# Patient Record
Sex: Female | Born: 1945
Health system: Southern US, Community
[De-identification: ages and names within clinical notes are randomized; demographics above are authoritative.]

## PROBLEM LIST (undated history)

## (undated) DIAGNOSIS — G4733 Obstructive sleep apnea (adult) (pediatric): Secondary | ICD-10-CM

## (undated) DIAGNOSIS — M1990 Unspecified osteoarthritis, unspecified site: Secondary | ICD-10-CM

## (undated) DIAGNOSIS — R42 Dizziness and giddiness: Secondary | ICD-10-CM

## (undated) DIAGNOSIS — Z860100 Personal history of colon polyps, unspecified: Secondary | ICD-10-CM

## (undated) DIAGNOSIS — M7062 Trochanteric bursitis, left hip: Secondary | ICD-10-CM

## (undated) DIAGNOSIS — R682 Dry mouth, unspecified: Secondary | ICD-10-CM

## (undated) DIAGNOSIS — M545 Low back pain, unspecified: Secondary | ICD-10-CM

## (undated) DIAGNOSIS — E7800 Pure hypercholesterolemia, unspecified: Secondary | ICD-10-CM

## (undated) DIAGNOSIS — K2950 Unspecified chronic gastritis without bleeding: Secondary | ICD-10-CM

## (undated) DIAGNOSIS — R5381 Other malaise: Secondary | ICD-10-CM

## (undated) DIAGNOSIS — F419 Anxiety disorder, unspecified: Secondary | ICD-10-CM

## (undated) DIAGNOSIS — K219 Gastro-esophageal reflux disease without esophagitis: Secondary | ICD-10-CM

## (undated) DIAGNOSIS — R1013 Epigastric pain: Secondary | ICD-10-CM

## (undated) DIAGNOSIS — G709 Myoneural disorder, unspecified: Secondary | ICD-10-CM

## (undated) DIAGNOSIS — R002 Palpitations: Secondary | ICD-10-CM

## (undated) DIAGNOSIS — G894 Chronic pain syndrome: Secondary | ICD-10-CM

## (undated) DIAGNOSIS — M8580 Other specified disorders of bone density and structure, unspecified site: Secondary | ICD-10-CM

## (undated) DIAGNOSIS — D509 Iron deficiency anemia, unspecified: Principal | ICD-10-CM

## (undated) DIAGNOSIS — N3010 Interstitial cystitis (chronic) without hematuria: Secondary | ICD-10-CM

## (undated) DIAGNOSIS — L409 Psoriasis, unspecified: Secondary | ICD-10-CM

## (undated) DIAGNOSIS — F339 Major depressive disorder, recurrent, unspecified: Secondary | ICD-10-CM

## (undated) DIAGNOSIS — I10 Essential (primary) hypertension: Secondary | ICD-10-CM

## (undated) DIAGNOSIS — B3784 Candidal otitis externa: Secondary | ICD-10-CM

## (undated) DIAGNOSIS — M797 Fibromyalgia: Secondary | ICD-10-CM

## (undated) DIAGNOSIS — K5730 Diverticulosis of large intestine without perforation or abscess without bleeding: Secondary | ICD-10-CM

## (undated) DIAGNOSIS — K117 Disturbances of salivary secretion: Secondary | ICD-10-CM

## (undated) DIAGNOSIS — D472 Monoclonal gammopathy: Secondary | ICD-10-CM

## (undated) DIAGNOSIS — R5383 Other fatigue: Secondary | ICD-10-CM

## (undated) DIAGNOSIS — E039 Hypothyroidism, unspecified: Secondary | ICD-10-CM

## (undated) DIAGNOSIS — K589 Irritable bowel syndrome without diarrhea: Secondary | ICD-10-CM

## (undated) DIAGNOSIS — R413 Other amnesia: Secondary | ICD-10-CM

## (undated) DIAGNOSIS — N182 Chronic kidney disease, stage 2 (mild): Secondary | ICD-10-CM

## (undated) DIAGNOSIS — M7061 Trochanteric bursitis, right hip: Secondary | ICD-10-CM

## (undated) HISTORY — DX: Anxiety disorder, unspecified: F41.9

## (undated) HISTORY — DX: Hypothyroidism, unspecified: E03.9

## (undated) HISTORY — PX: ABDOMINAL HYSTERECTOMY: SHX81

## (undated) HISTORY — DX: Other fatigue: R53.83

## (undated) HISTORY — DX: Interstitial cystitis (chronic) without hematuria: N30.10

## (undated) HISTORY — DX: Disturbances of salivary secretion: K11.7

## (undated) HISTORY — DX: Monoclonal gammopathy: D47.2

## (undated) HISTORY — PX: COLONOSCOPY: SHX174

## (undated) HISTORY — DX: Unspecified chronic gastritis without bleeding: K29.50

## (undated) HISTORY — DX: Other specified disorders of bone density and structure, unspecified site: M85.80

## (undated) HISTORY — DX: Chronic kidney disease, stage 2 (mild): N18.2

## (undated) HISTORY — PX: HERNIA REPAIR: SHX51

## (undated) HISTORY — DX: Chronic pain syndrome: G89.4

## (undated) HISTORY — DX: Obstructive sleep apnea (adult) (pediatric): G47.33

## (undated) HISTORY — DX: Low back pain, unspecified: M54.50

## (undated) HISTORY — PX: NASAL SINUS SURGERY: SHX719

## (undated) HISTORY — DX: Low back pain: M54.5

## (undated) HISTORY — DX: Personal history of colonic polyps: Z86.010

## (undated) HISTORY — DX: Myoneural disorder, unspecified: G70.9

## (undated) HISTORY — DX: Epigastric pain: R10.13

## (undated) HISTORY — DX: Dry mouth, unspecified: R68.2

## (undated) HISTORY — DX: Trochanteric bursitis, right hip: M70.61

## (undated) HISTORY — DX: Major depressive disorder, recurrent, unspecified: F33.9

## (undated) HISTORY — DX: Trochanteric bursitis, right hip: M70.62

## (undated) HISTORY — DX: Personal history of colon polyps, unspecified: Z86.0100

## (undated) HISTORY — DX: Iron deficiency anemia, unspecified: D50.9

## (undated) HISTORY — DX: Gastro-esophageal reflux disease without esophagitis: K21.9

## (undated) HISTORY — DX: Diverticulosis of large intestine without perforation or abscess without bleeding: K57.30

## (undated) HISTORY — DX: Psoriasis, unspecified: L40.9

## (undated) HISTORY — PX: EYE SURGERY: SHX253

## (undated) HISTORY — PX: CHOLECYSTECTOMY: SHX55

## (undated) HISTORY — PX: ESOPHAGOGASTRODUODENOSCOPY: SHX1529

## (undated) HISTORY — DX: Other malaise: R53.81

## (undated) HISTORY — DX: Other amnesia: R41.3

## (undated) HISTORY — DX: Palpitations: R00.2

## (undated) HISTORY — PX: KNEE ARTHROSCOPY: SHX127

## (undated) HISTORY — PX: HAND SURGERY: SHX662

## (undated) HISTORY — PX: TOTAL KNEE ARTHROPLASTY: SHX125

## (undated) HISTORY — DX: Candidal otitis externa: B37.84

## (undated) HISTORY — DX: Unspecified osteoarthritis, unspecified site: M19.90

## (undated) HISTORY — DX: Dizziness and giddiness: R42

## (undated) HISTORY — DX: Fibromyalgia: M79.7

## (undated) HISTORY — PX: CARDIAC CATHETERIZATION: SHX172

## (undated) HISTORY — DX: Essential (primary) hypertension: I10

## (undated) HISTORY — DX: Pure hypercholesterolemia, unspecified: E78.00

---

## 1998-07-10 ENCOUNTER — Ambulatory Visit (HOSPITAL_COMMUNITY): Admission: RE | Admit: 1998-07-10 | Discharge: 1998-07-10 | Payer: Self-pay | Admitting: Gynecology

## 1998-07-17 ENCOUNTER — Ambulatory Visit (HOSPITAL_COMMUNITY): Admission: RE | Admit: 1998-07-17 | Discharge: 1998-07-17 | Payer: Self-pay | Admitting: Gynecology

## 1998-11-11 ENCOUNTER — Encounter: Payer: Self-pay | Admitting: Gynecology

## 1998-11-11 ENCOUNTER — Ambulatory Visit (HOSPITAL_COMMUNITY): Admission: RE | Admit: 1998-11-11 | Discharge: 1998-11-11 | Payer: Self-pay | Admitting: Gynecology

## 1999-04-29 ENCOUNTER — Emergency Department (HOSPITAL_COMMUNITY): Admission: EM | Admit: 1999-04-29 | Discharge: 1999-04-29 | Payer: Self-pay | Admitting: Emergency Medicine

## 1999-04-29 ENCOUNTER — Encounter: Payer: Self-pay | Admitting: Emergency Medicine

## 1999-05-03 ENCOUNTER — Inpatient Hospital Stay (HOSPITAL_COMMUNITY): Admission: EM | Admit: 1999-05-03 | Discharge: 1999-05-06 | Payer: Self-pay | Admitting: *Deleted

## 1999-05-04 ENCOUNTER — Encounter: Payer: Self-pay | Admitting: Internal Medicine

## 1999-05-15 ENCOUNTER — Encounter: Payer: Self-pay | Admitting: Gynecology

## 1999-05-15 ENCOUNTER — Ambulatory Visit (HOSPITAL_COMMUNITY): Admission: RE | Admit: 1999-05-15 | Discharge: 1999-05-15 | Payer: Self-pay | Admitting: Gynecology

## 1999-05-20 ENCOUNTER — Other Ambulatory Visit: Admission: RE | Admit: 1999-05-20 | Discharge: 1999-05-20 | Payer: Self-pay | Admitting: Gynecology

## 1999-08-26 ENCOUNTER — Encounter (INDEPENDENT_AMBULATORY_CARE_PROVIDER_SITE_OTHER): Payer: Self-pay | Admitting: Specialist

## 1999-08-26 ENCOUNTER — Ambulatory Visit (HOSPITAL_COMMUNITY): Admission: RE | Admit: 1999-08-26 | Discharge: 1999-08-26 | Payer: Self-pay | Admitting: Gastroenterology

## 2000-04-25 ENCOUNTER — Encounter: Payer: Self-pay | Admitting: Otolaryngology

## 2000-04-25 ENCOUNTER — Encounter: Admission: RE | Admit: 2000-04-25 | Discharge: 2000-04-25 | Payer: Self-pay | Admitting: Otolaryngology

## 2000-06-08 ENCOUNTER — Encounter: Payer: Self-pay | Admitting: Gynecology

## 2000-06-08 ENCOUNTER — Ambulatory Visit (HOSPITAL_COMMUNITY): Admission: RE | Admit: 2000-06-08 | Discharge: 2000-06-08 | Payer: Self-pay | Admitting: Gynecology

## 2000-12-01 ENCOUNTER — Ambulatory Visit (HOSPITAL_COMMUNITY): Admission: RE | Admit: 2000-12-01 | Discharge: 2000-12-01 | Payer: Self-pay | Admitting: Orthopedic Surgery

## 2001-04-02 ENCOUNTER — Encounter: Payer: Self-pay | Admitting: Pulmonary Disease

## 2001-04-02 ENCOUNTER — Ambulatory Visit (HOSPITAL_COMMUNITY): Admission: RE | Admit: 2001-04-02 | Discharge: 2001-04-02 | Payer: Self-pay | Admitting: Pulmonary Disease

## 2001-07-27 ENCOUNTER — Ambulatory Visit (HOSPITAL_COMMUNITY): Admission: RE | Admit: 2001-07-27 | Discharge: 2001-07-27 | Payer: Self-pay | Admitting: Gynecology

## 2001-07-27 ENCOUNTER — Encounter: Payer: Self-pay | Admitting: Gynecology

## 2001-08-21 ENCOUNTER — Encounter: Payer: Self-pay | Admitting: Neurosurgery

## 2001-08-23 ENCOUNTER — Encounter: Payer: Self-pay | Admitting: Neurosurgery

## 2001-08-23 ENCOUNTER — Ambulatory Visit (HOSPITAL_COMMUNITY): Admission: RE | Admit: 2001-08-23 | Discharge: 2001-08-24 | Payer: Self-pay | Admitting: Neurosurgery

## 2002-04-06 ENCOUNTER — Ambulatory Visit (HOSPITAL_COMMUNITY): Admission: RE | Admit: 2002-04-06 | Discharge: 2002-04-06 | Payer: Self-pay | Admitting: Cardiology

## 2002-04-10 ENCOUNTER — Encounter: Payer: Self-pay | Admitting: *Deleted

## 2002-04-10 ENCOUNTER — Ambulatory Visit (HOSPITAL_COMMUNITY): Admission: RE | Admit: 2002-04-10 | Discharge: 2002-04-10 | Payer: Self-pay | Admitting: *Deleted

## 2002-09-06 ENCOUNTER — Ambulatory Visit (HOSPITAL_COMMUNITY): Admission: RE | Admit: 2002-09-06 | Discharge: 2002-09-06 | Payer: Self-pay | Admitting: Gynecology

## 2002-09-06 ENCOUNTER — Encounter: Payer: Self-pay | Admitting: Gynecology

## 2002-11-14 ENCOUNTER — Other Ambulatory Visit: Admission: RE | Admit: 2002-11-14 | Discharge: 2002-11-14 | Payer: Self-pay | Admitting: Gynecology

## 2003-04-20 ENCOUNTER — Emergency Department (HOSPITAL_COMMUNITY): Admission: EM | Admit: 2003-04-20 | Discharge: 2003-04-20 | Payer: Self-pay | Admitting: Emergency Medicine

## 2003-04-20 ENCOUNTER — Encounter: Payer: Self-pay | Admitting: Emergency Medicine

## 2003-10-15 ENCOUNTER — Ambulatory Visit (HOSPITAL_COMMUNITY): Admission: RE | Admit: 2003-10-15 | Discharge: 2003-10-15 | Payer: Self-pay | Admitting: Gynecology

## 2003-11-18 ENCOUNTER — Other Ambulatory Visit: Admission: RE | Admit: 2003-11-18 | Discharge: 2003-11-18 | Payer: Self-pay | Admitting: Gynecology

## 2004-04-24 ENCOUNTER — Encounter: Payer: Self-pay | Admitting: Gastroenterology

## 2004-07-21 ENCOUNTER — Ambulatory Visit: Payer: Self-pay | Admitting: Pulmonary Disease

## 2004-10-19 ENCOUNTER — Ambulatory Visit: Payer: Self-pay | Admitting: Pulmonary Disease

## 2004-10-26 ENCOUNTER — Ambulatory Visit (HOSPITAL_COMMUNITY): Admission: RE | Admit: 2004-10-26 | Discharge: 2004-10-26 | Payer: Self-pay | Admitting: Gynecology

## 2004-10-28 ENCOUNTER — Ambulatory Visit: Payer: Self-pay | Admitting: Hematology & Oncology

## 2004-11-03 ENCOUNTER — Ambulatory Visit: Payer: Self-pay | Admitting: Cardiology

## 2004-11-06 ENCOUNTER — Ambulatory Visit: Payer: Self-pay

## 2004-11-19 ENCOUNTER — Ambulatory Visit: Payer: Self-pay | Admitting: Pulmonary Disease

## 2004-11-24 ENCOUNTER — Other Ambulatory Visit: Admission: RE | Admit: 2004-11-24 | Discharge: 2004-11-24 | Payer: Self-pay | Admitting: Gynecology

## 2005-05-12 ENCOUNTER — Ambulatory Visit (HOSPITAL_COMMUNITY): Admission: RE | Admit: 2005-05-12 | Discharge: 2005-05-12 | Payer: Self-pay | Admitting: Dermatology

## 2005-07-11 ENCOUNTER — Emergency Department (HOSPITAL_COMMUNITY): Admission: EM | Admit: 2005-07-11 | Discharge: 2005-07-12 | Payer: Self-pay | Admitting: Emergency Medicine

## 2005-07-28 ENCOUNTER — Ambulatory Visit: Payer: Self-pay | Admitting: Pulmonary Disease

## 2005-10-29 ENCOUNTER — Ambulatory Visit: Payer: Self-pay | Admitting: Pulmonary Disease

## 2005-11-03 ENCOUNTER — Ambulatory Visit: Payer: Self-pay | Admitting: Pulmonary Disease

## 2005-12-07 ENCOUNTER — Ambulatory Visit (HOSPITAL_COMMUNITY): Admission: RE | Admit: 2005-12-07 | Discharge: 2005-12-07 | Payer: Self-pay | Admitting: Gynecology

## 2006-01-13 ENCOUNTER — Ambulatory Visit (HOSPITAL_BASED_OUTPATIENT_CLINIC_OR_DEPARTMENT_OTHER): Admission: RE | Admit: 2006-01-13 | Discharge: 2006-01-13 | Payer: Self-pay | Admitting: Specialist

## 2006-03-16 ENCOUNTER — Other Ambulatory Visit: Admission: RE | Admit: 2006-03-16 | Discharge: 2006-03-16 | Payer: Self-pay | Admitting: Gynecology

## 2006-03-29 ENCOUNTER — Ambulatory Visit: Payer: Self-pay | Admitting: Pulmonary Disease

## 2006-04-14 ENCOUNTER — Ambulatory Visit: Payer: Self-pay | Admitting: Gastroenterology

## 2006-04-20 HISTORY — PX: COLONOSCOPY: SHX174

## 2006-05-02 ENCOUNTER — Ambulatory Visit: Payer: Self-pay | Admitting: Gastroenterology

## 2006-07-28 ENCOUNTER — Ambulatory Visit: Payer: Self-pay | Admitting: Pulmonary Disease

## 2006-08-18 ENCOUNTER — Ambulatory Visit: Payer: Self-pay | Admitting: Internal Medicine

## 2006-09-14 ENCOUNTER — Inpatient Hospital Stay (HOSPITAL_COMMUNITY): Admission: RE | Admit: 2006-09-14 | Discharge: 2006-09-18 | Payer: Self-pay | Admitting: Specialist

## 2006-12-28 ENCOUNTER — Ambulatory Visit: Payer: Self-pay | Admitting: Pulmonary Disease

## 2006-12-28 LAB — CONVERTED CEMR LAB
ALT: 14 units/L (ref 0–40)
AST: 13 units/L (ref 0–37)
Bilirubin, Direct: 0.1 mg/dL (ref 0.0–0.3)
Cholesterol: 288 mg/dL (ref 0–200)
Direct LDL: 222.2 mg/dL
HDL: 32.3 mg/dL — ABNORMAL LOW (ref >39.0)
Total CHOL/HDL Ratio: 8.9
Total Protein: 6.9 g/dL (ref 6.0–8.3)

## 2007-01-02 ENCOUNTER — Ambulatory Visit: Payer: Self-pay | Admitting: Cardiology

## 2007-01-11 ENCOUNTER — Ambulatory Visit (HOSPITAL_COMMUNITY): Admission: RE | Admit: 2007-01-11 | Discharge: 2007-01-11 | Payer: Self-pay | Admitting: Gynecology

## 2007-01-20 ENCOUNTER — Ambulatory Visit: Payer: Self-pay | Admitting: Cardiology

## 2007-01-20 LAB — CONVERTED CEMR LAB
ALT: 14 units/L (ref 0–40)
AST: 15 units/L (ref 0–37)
Albumin: 3.7 g/dL (ref 3.5–5.2)
HDL: 32.6 mg/dL — ABNORMAL LOW (ref >39.0)
Total Bilirubin: 0.8 mg/dL (ref 0.3–1.2)
VLDL: 28 mg/dL (ref 0–40)

## 2007-02-17 ENCOUNTER — Ambulatory Visit: Payer: Self-pay | Admitting: Pulmonary Disease

## 2007-02-27 ENCOUNTER — Ambulatory Visit: Payer: Self-pay | Admitting: Pulmonary Disease

## 2007-02-27 LAB — CONVERTED CEMR LAB
ALT: 16 units/L (ref 0–40)
Alkaline Phosphatase: 91 units/L (ref 39–117)
Total CHOL/HDL Ratio: 6.4
VLDL: 41 mg/dL — ABNORMAL HIGH (ref 0–40)

## 2007-03-02 ENCOUNTER — Ambulatory Visit: Payer: Self-pay | Admitting: Cardiology

## 2007-04-18 ENCOUNTER — Other Ambulatory Visit: Admission: RE | Admit: 2007-04-18 | Discharge: 2007-04-18 | Payer: Self-pay | Admitting: Gynecology

## 2007-04-21 ENCOUNTER — Encounter: Admission: RE | Admit: 2007-04-21 | Discharge: 2007-04-21 | Payer: Self-pay | Admitting: Specialist

## 2007-06-05 ENCOUNTER — Ambulatory Visit: Payer: Self-pay | Admitting: Pulmonary Disease

## 2007-06-05 LAB — CONVERTED CEMR LAB
Albumin: 3.7 g/dL (ref 3.5–5.2)
Direct LDL: 145.9 mg/dL
HDL: 30 mg/dL — ABNORMAL LOW (ref >39.0)
Total Bilirubin: 0.9 mg/dL (ref 0.3–1.2)
Total CHOL/HDL Ratio: 6.9
Triglycerides: 206 mg/dL (ref 0–149)
VLDL: 41 mg/dL — ABNORMAL HIGH (ref 0–40)

## 2007-06-15 ENCOUNTER — Ambulatory Visit: Payer: Self-pay | Admitting: Pulmonary Disease

## 2007-06-21 HISTORY — PX: PATELLA FRACTURE SURGERY: SHX735

## 2007-07-12 ENCOUNTER — Ambulatory Visit (HOSPITAL_COMMUNITY): Admission: RE | Admit: 2007-07-12 | Discharge: 2007-07-13 | Payer: Self-pay | Admitting: Orthopedic Surgery

## 2007-07-26 ENCOUNTER — Ambulatory Visit: Payer: Self-pay | Admitting: Pulmonary Disease

## 2007-07-26 LAB — CONVERTED CEMR LAB
Cholesterol: 188 mg/dL (ref 0–200)
HDL: 33.1 mg/dL — ABNORMAL LOW (ref >39.0)
Total CHOL/HDL Ratio: 5.7
Triglycerides: 214 mg/dL (ref 0–149)
Vit D, 1,25-Dihydroxy: 5 — ABNORMAL LOW (ref 30–89)

## 2007-08-01 DIAGNOSIS — M797 Fibromyalgia: Secondary | ICD-10-CM

## 2007-08-01 DIAGNOSIS — E7800 Pure hypercholesterolemia, unspecified: Secondary | ICD-10-CM | POA: Insufficient documentation

## 2007-08-01 DIAGNOSIS — D126 Benign neoplasm of colon, unspecified: Secondary | ICD-10-CM

## 2007-08-01 DIAGNOSIS — R079 Chest pain, unspecified: Secondary | ICD-10-CM

## 2007-08-01 DIAGNOSIS — N3090 Cystitis, unspecified without hematuria: Secondary | ICD-10-CM | POA: Insufficient documentation

## 2007-08-01 DIAGNOSIS — K219 Gastro-esophageal reflux disease without esophagitis: Secondary | ICD-10-CM

## 2007-08-01 DIAGNOSIS — F411 Generalized anxiety disorder: Secondary | ICD-10-CM | POA: Insufficient documentation

## 2007-08-01 DIAGNOSIS — Z8679 Personal history of other diseases of the circulatory system: Secondary | ICD-10-CM | POA: Insufficient documentation

## 2007-08-01 DIAGNOSIS — I10 Essential (primary) hypertension: Secondary | ICD-10-CM

## 2007-09-11 ENCOUNTER — Ambulatory Visit: Payer: Self-pay | Admitting: Pulmonary Disease

## 2007-09-15 ENCOUNTER — Telehealth (INDEPENDENT_AMBULATORY_CARE_PROVIDER_SITE_OTHER): Payer: Self-pay | Admitting: *Deleted

## 2007-10-25 ENCOUNTER — Telehealth: Payer: Self-pay | Admitting: Pulmonary Disease

## 2007-10-30 ENCOUNTER — Ambulatory Visit: Payer: Self-pay | Admitting: Pulmonary Disease

## 2007-10-30 ENCOUNTER — Encounter: Payer: Self-pay | Admitting: Adult Health

## 2007-11-14 ENCOUNTER — Telehealth (INDEPENDENT_AMBULATORY_CARE_PROVIDER_SITE_OTHER): Payer: Self-pay | Admitting: *Deleted

## 2007-11-16 LAB — CONVERTED CEMR LAB
ALT: 16 units/L (ref 0–35)
AST: 14 units/L (ref 0–37)
Albumin: 3.6 g/dL (ref 3.5–5.2)
Alkaline Phosphatase: 74 units/L (ref 39–117)
BUN: 11 mg/dL (ref 6–23)
Basophils Absolute: 0 10*3/uL (ref 0.0–0.1)
Chloride: 107 meq/L (ref 96–112)
Creatinine, Ser: 0.9 mg/dL (ref 0.4–1.2)
HCT: 35.7 % — ABNORMAL LOW (ref 36.0–46.0)
MCHC: 33.7 g/dL (ref 30.0–36.0)
Neutrophils Relative %: 35.7 % — ABNORMAL LOW (ref 43.0–77.0)
RBC: 4.11 M/uL (ref 3.87–5.11)
RDW: 12.9 % (ref 11.5–14.6)
Sodium: 143 meq/L (ref 135–145)
Total Bilirubin: 0.4 mg/dL (ref 0.3–1.2)
Total CHOL/HDL Ratio: 8.6
Triglycerides: 415 mg/dL (ref 0–149)

## 2007-11-27 ENCOUNTER — Telehealth: Payer: Self-pay | Admitting: Internal Medicine

## 2007-11-28 ENCOUNTER — Telehealth (INDEPENDENT_AMBULATORY_CARE_PROVIDER_SITE_OTHER): Payer: Self-pay | Admitting: *Deleted

## 2007-11-30 ENCOUNTER — Ambulatory Visit: Payer: Self-pay | Admitting: Cardiology

## 2007-11-30 LAB — CONVERTED CEMR LAB
Albumin: 3.7 g/dL (ref 3.5–5.2)
Direct LDL: 139.4 mg/dL
HDL: 29.1 mg/dL — ABNORMAL LOW (ref >39.0)
Total CHOL/HDL Ratio: 6.9
Total CK: 72 units/L (ref 7–177)
Triglycerides: 188 mg/dL — ABNORMAL HIGH (ref 0–149)
VLDL: 38 mg/dL (ref 0–40)

## 2007-12-15 ENCOUNTER — Ambulatory Visit: Payer: Self-pay | Admitting: Pulmonary Disease

## 2007-12-18 ENCOUNTER — Observation Stay (HOSPITAL_COMMUNITY): Admission: AD | Admit: 2007-12-18 | Discharge: 2007-12-21 | Payer: Self-pay | Admitting: Pulmonary Disease

## 2007-12-18 ENCOUNTER — Telehealth (INDEPENDENT_AMBULATORY_CARE_PROVIDER_SITE_OTHER): Payer: Self-pay | Admitting: *Deleted

## 2007-12-18 ENCOUNTER — Ambulatory Visit: Payer: Self-pay | Admitting: Pulmonary Disease

## 2008-01-03 ENCOUNTER — Ambulatory Visit: Payer: Self-pay

## 2008-01-03 LAB — CONVERTED CEMR LAB
ALT: 19 units/L (ref 0–35)
AST: 16 units/L (ref 0–37)
Direct LDL: 145.2 mg/dL
HDL: 30.7 mg/dL — ABNORMAL LOW (ref >39.0)
Total Protein: 7.1 g/dL (ref 6.0–8.3)
Triglycerides: 182 mg/dL — ABNORMAL HIGH (ref 0–149)

## 2008-01-08 ENCOUNTER — Ambulatory Visit: Payer: Self-pay | Admitting: Cardiology

## 2008-01-12 ENCOUNTER — Ambulatory Visit: Payer: Self-pay | Admitting: Pulmonary Disease

## 2008-01-12 DIAGNOSIS — E8809 Other disorders of plasma-protein metabolism, not elsewhere classified: Secondary | ICD-10-CM

## 2008-01-12 DIAGNOSIS — M1990 Unspecified osteoarthritis, unspecified site: Secondary | ICD-10-CM | POA: Insufficient documentation

## 2008-01-12 DIAGNOSIS — J329 Chronic sinusitis, unspecified: Secondary | ICD-10-CM | POA: Insufficient documentation

## 2008-01-12 DIAGNOSIS — J309 Allergic rhinitis, unspecified: Secondary | ICD-10-CM | POA: Insufficient documentation

## 2008-01-13 DIAGNOSIS — E559 Vitamin D deficiency, unspecified: Secondary | ICD-10-CM

## 2008-01-13 DIAGNOSIS — M899 Disorder of bone, unspecified: Secondary | ICD-10-CM | POA: Insufficient documentation

## 2008-01-13 DIAGNOSIS — K5730 Diverticulosis of large intestine without perforation or abscess without bleeding: Secondary | ICD-10-CM | POA: Insufficient documentation

## 2008-01-13 DIAGNOSIS — M949 Disorder of cartilage, unspecified: Secondary | ICD-10-CM

## 2008-01-13 DIAGNOSIS — L408 Other psoriasis: Secondary | ICD-10-CM

## 2008-01-16 ENCOUNTER — Encounter: Payer: Self-pay | Admitting: Pulmonary Disease

## 2008-01-16 ENCOUNTER — Ambulatory Visit (HOSPITAL_COMMUNITY): Admission: RE | Admit: 2008-01-16 | Discharge: 2008-01-16 | Payer: Self-pay | Admitting: Gynecology

## 2008-02-28 ENCOUNTER — Ambulatory Visit: Payer: Self-pay | Admitting: Cardiology

## 2008-02-28 LAB — CONVERTED CEMR LAB
Alkaline Phosphatase: 78 units/L (ref 39–117)
Bilirubin, Direct: 0.1 mg/dL (ref 0.0–0.3)
Cholesterol: 173 mg/dL (ref 0–200)
LDL Cholesterol: 102 mg/dL — ABNORMAL HIGH (ref 0–99)
Total Bilirubin: 0.7 mg/dL (ref 0.3–1.2)
Total Protein: 6.9 g/dL (ref 6.0–8.3)

## 2008-03-04 ENCOUNTER — Ambulatory Visit: Payer: Self-pay | Admitting: Internal Medicine

## 2008-03-10 ENCOUNTER — Encounter: Admission: RE | Admit: 2008-03-10 | Discharge: 2008-03-10 | Payer: Self-pay | Admitting: Neurosurgery

## 2008-03-13 ENCOUNTER — Encounter: Payer: Self-pay | Admitting: Pulmonary Disease

## 2008-03-21 ENCOUNTER — Ambulatory Visit: Payer: Self-pay | Admitting: Pulmonary Disease

## 2008-03-22 LAB — CONVERTED CEMR LAB
Crystals: NEGATIVE
Ketones, ur: NEGATIVE mg/dL
Specific Gravity, Urine: 1.025 (ref 1.000–1.03)
Total Protein, Urine: NEGATIVE mg/dL
Urine Glucose: NEGATIVE mg/dL
Urobilinogen, UA: 0.2 (ref 0.0–1.0)

## 2008-03-27 ENCOUNTER — Telehealth: Payer: Self-pay | Admitting: Pulmonary Disease

## 2008-04-19 ENCOUNTER — Other Ambulatory Visit: Admission: RE | Admit: 2008-04-19 | Discharge: 2008-04-19 | Payer: Self-pay | Admitting: Gynecology

## 2008-05-21 ENCOUNTER — Inpatient Hospital Stay (HOSPITAL_COMMUNITY): Admission: RE | Admit: 2008-05-21 | Discharge: 2008-05-24 | Payer: Self-pay | Admitting: Neurosurgery

## 2008-05-21 HISTORY — PX: POSTERIOR LAMINECTOMY / DECOMPRESSION LUMBAR SPINE: SUR740

## 2008-05-30 ENCOUNTER — Encounter: Payer: Self-pay | Admitting: Pulmonary Disease

## 2008-06-24 ENCOUNTER — Encounter: Payer: Self-pay | Admitting: Pulmonary Disease

## 2008-06-25 ENCOUNTER — Encounter: Payer: Self-pay | Admitting: Pulmonary Disease

## 2008-07-22 ENCOUNTER — Ambulatory Visit: Payer: Self-pay | Admitting: Pulmonary Disease

## 2008-07-25 ENCOUNTER — Telehealth: Payer: Self-pay | Admitting: Pulmonary Disease

## 2008-07-25 ENCOUNTER — Ambulatory Visit: Payer: Self-pay | Admitting: Cardiology

## 2008-07-25 DIAGNOSIS — M545 Low back pain: Secondary | ICD-10-CM

## 2008-07-30 ENCOUNTER — Telehealth: Payer: Self-pay | Admitting: Gastroenterology

## 2008-08-01 ENCOUNTER — Telehealth (INDEPENDENT_AMBULATORY_CARE_PROVIDER_SITE_OTHER): Payer: Self-pay | Admitting: *Deleted

## 2008-08-05 ENCOUNTER — Ambulatory Visit: Payer: Self-pay | Admitting: Internal Medicine

## 2008-08-05 DIAGNOSIS — Z860100 Personal history of colon polyps, unspecified: Secondary | ICD-10-CM | POA: Insufficient documentation

## 2008-08-05 DIAGNOSIS — R112 Nausea with vomiting, unspecified: Secondary | ICD-10-CM

## 2008-08-05 DIAGNOSIS — R110 Nausea: Secondary | ICD-10-CM

## 2008-08-05 LAB — CONVERTED CEMR LAB
ALT: 19 units/L (ref 0–35)
Amylase: 22 units/L — ABNORMAL LOW (ref 27–131)
Crystals: NEGATIVE
Hemoglobin, Urine: NEGATIVE
Lipase: 20 units/L (ref 11.0–59.0)
Nitrite: NEGATIVE
Specific Gravity, Urine: 1.02 (ref 1.000–1.03)
Total Bilirubin: 0.5 mg/dL (ref 0.3–1.2)
Urine Glucose: NEGATIVE mg/dL
Urobilinogen, UA: 0.2 (ref 0.0–1.0)

## 2008-08-06 ENCOUNTER — Ambulatory Visit (HOSPITAL_COMMUNITY): Admission: RE | Admit: 2008-08-06 | Discharge: 2008-08-06 | Payer: Self-pay | Admitting: Internal Medicine

## 2008-08-12 ENCOUNTER — Telehealth (INDEPENDENT_AMBULATORY_CARE_PROVIDER_SITE_OTHER): Payer: Self-pay | Admitting: *Deleted

## 2008-08-19 ENCOUNTER — Telehealth (INDEPENDENT_AMBULATORY_CARE_PROVIDER_SITE_OTHER): Payer: Self-pay | Admitting: *Deleted

## 2008-08-19 ENCOUNTER — Ambulatory Visit: Payer: Self-pay | Admitting: Gastroenterology

## 2008-08-19 DIAGNOSIS — R1013 Epigastric pain: Secondary | ICD-10-CM

## 2008-08-19 LAB — CONVERTED CEMR LAB
Ferritin: 22.1 ng/mL (ref 10.0–291.0)
Folate: 4.9 ng/mL
Iron: 34 ug/dL — ABNORMAL LOW (ref 42–145)
Saturation Ratios: 7.7 % — ABNORMAL LOW (ref 20.0–50.0)
Transferrin: 314.7 mg/dL (ref >=212.0)
Vitamin B-12: 330 pg/mL (ref 211–911)

## 2008-08-21 ENCOUNTER — Ambulatory Visit: Payer: Self-pay | Admitting: Gastroenterology

## 2008-08-21 ENCOUNTER — Encounter: Payer: Self-pay | Admitting: Gastroenterology

## 2008-08-21 DIAGNOSIS — K2990 Gastroduodenitis, unspecified, without bleeding: Secondary | ICD-10-CM

## 2008-08-21 DIAGNOSIS — K2970 Gastritis, unspecified, without bleeding: Secondary | ICD-10-CM | POA: Insufficient documentation

## 2008-08-21 LAB — CONVERTED CEMR LAB: UREASE: NEGATIVE

## 2008-08-22 ENCOUNTER — Encounter: Admission: RE | Admit: 2008-08-22 | Discharge: 2008-08-22 | Payer: Self-pay | Admitting: Neurosurgery

## 2008-08-22 ENCOUNTER — Encounter: Payer: Self-pay | Admitting: Gastroenterology

## 2008-08-22 ENCOUNTER — Telehealth (INDEPENDENT_AMBULATORY_CARE_PROVIDER_SITE_OTHER): Payer: Self-pay | Admitting: *Deleted

## 2008-08-23 ENCOUNTER — Telehealth: Payer: Self-pay | Admitting: Gastroenterology

## 2008-08-26 ENCOUNTER — Telehealth: Payer: Self-pay | Admitting: Gastroenterology

## 2008-08-26 LAB — CONVERTED CEMR LAB
Alkaline Phosphatase: 90 units/L (ref 39–117)
Bilirubin, Direct: 0.1 mg/dL (ref 0.0–0.3)
CO2: 30 meq/L (ref 19–32)
Calcium: 9.3 mg/dL (ref 8.4–10.5)
Cholesterol: 196 mg/dL (ref 0–200)
Crystals: NEGATIVE
Eosinophils Relative: 8.2 % — ABNORMAL HIGH (ref 0.0–5.0)
Free T4: 0.7 ng/dL (ref 0.6–1.6)
GFR calc Af Amer: 82 mL/min
Gamma Globulin: 12.5 % (ref 11.1–18.8)
Glucose, Bld: 97 mg/dL (ref 70–99)
HDL: 31.8 mg/dL — ABNORMAL LOW (ref >39.0)
Hemoglobin, Urine: NEGATIVE
IgA: 336 mg/dL (ref 68–378)
IgG (Immunoglobin G), Serum: 926 mg/dL (ref 694–1618)
IgM, Serum: 319 mg/dL — ABNORMAL HIGH (ref 60–263)
Lymphocytes Relative: 30.1 % (ref 12.0–46.0)
M-Spike, %: 0.31
Monocytes Relative: 4.3 % (ref 3.0–12.0)
Neutrophils Relative %: 56.7 % (ref 43.0–77.0)
Nitrite: NEGATIVE
Platelets: 199 10*3/uL (ref 150–400)
Potassium: 4.1 meq/L (ref 3.5–5.1)
RDW: 13.5 % (ref 11.5–14.6)
Sodium: 142 meq/L (ref 135–145)
Specific Gravity, Urine: 1.015 (ref 1.000–1.03)
T3, Free: 3.4 pg/mL (ref 2.3–4.2)
TSH: 2.9 microintl units/mL (ref 0.35–5.50)
Total Bilirubin: 0.6 mg/dL (ref 0.3–1.2)
Total CHOL/HDL Ratio: 6.2
Total Protein, Urine: NEGATIVE mg/dL
Triglycerides: 265 mg/dL (ref 0–149)
Urine Glucose: NEGATIVE mg/dL
VLDL: 53 mg/dL — ABNORMAL HIGH (ref 0–40)
Vit D, 1,25-Dihydroxy: 45 (ref 30–89)
WBC: 6.3 10*3/uL (ref 4.5–10.5)

## 2008-08-27 ENCOUNTER — Observation Stay (HOSPITAL_COMMUNITY): Admission: AD | Admit: 2008-08-27 | Discharge: 2008-08-29 | Payer: Self-pay | Admitting: Internal Medicine

## 2008-08-27 ENCOUNTER — Telehealth: Payer: Self-pay | Admitting: Gastroenterology

## 2008-08-27 ENCOUNTER — Ambulatory Visit: Payer: Self-pay | Admitting: Gastroenterology

## 2008-08-27 DIAGNOSIS — R197 Diarrhea, unspecified: Secondary | ICD-10-CM | POA: Insufficient documentation

## 2008-08-28 ENCOUNTER — Ambulatory Visit: Payer: Self-pay | Admitting: Internal Medicine

## 2008-08-29 ENCOUNTER — Encounter: Payer: Self-pay | Admitting: Nurse Practitioner

## 2008-09-02 ENCOUNTER — Encounter: Payer: Self-pay | Admitting: Pulmonary Disease

## 2008-09-03 ENCOUNTER — Telehealth: Payer: Self-pay | Admitting: Gastroenterology

## 2008-09-06 ENCOUNTER — Ambulatory Visit: Payer: Self-pay | Admitting: Gastroenterology

## 2008-09-09 ENCOUNTER — Encounter: Payer: Self-pay | Admitting: Pulmonary Disease

## 2008-09-12 ENCOUNTER — Encounter: Payer: Self-pay | Admitting: Pulmonary Disease

## 2008-09-12 LAB — CONVERTED CEMR LAB: IgA: 390 mg/dL — ABNORMAL HIGH (ref 68–378)

## 2008-09-20 HISTORY — PX: OOPHORECTOMY: SHX86

## 2008-09-20 HISTORY — PX: LAMINOTOMY: SHX998

## 2008-10-03 ENCOUNTER — Ambulatory Visit: Payer: Self-pay | Admitting: Gastroenterology

## 2008-10-03 DIAGNOSIS — Z9889 Other specified postprocedural states: Secondary | ICD-10-CM | POA: Insufficient documentation

## 2008-12-30 ENCOUNTER — Telehealth (INDEPENDENT_AMBULATORY_CARE_PROVIDER_SITE_OTHER): Payer: Self-pay | Admitting: *Deleted

## 2008-12-31 ENCOUNTER — Encounter: Payer: Self-pay | Admitting: Cardiology

## 2008-12-31 ENCOUNTER — Ambulatory Visit: Payer: Self-pay

## 2008-12-31 ENCOUNTER — Telehealth (INDEPENDENT_AMBULATORY_CARE_PROVIDER_SITE_OTHER): Payer: Self-pay | Admitting: *Deleted

## 2009-01-06 ENCOUNTER — Telehealth (INDEPENDENT_AMBULATORY_CARE_PROVIDER_SITE_OTHER): Payer: Self-pay | Admitting: *Deleted

## 2009-01-09 ENCOUNTER — Ambulatory Visit: Payer: Self-pay | Admitting: Pulmonary Disease

## 2009-01-09 ENCOUNTER — Telehealth (INDEPENDENT_AMBULATORY_CARE_PROVIDER_SITE_OTHER): Payer: Self-pay | Admitting: *Deleted

## 2009-01-09 DIAGNOSIS — F329 Major depressive disorder, single episode, unspecified: Secondary | ICD-10-CM

## 2009-01-29 ENCOUNTER — Ambulatory Visit (HOSPITAL_COMMUNITY): Admission: RE | Admit: 2009-01-29 | Discharge: 2009-01-29 | Payer: Self-pay | Admitting: Gynecology

## 2009-02-04 ENCOUNTER — Encounter: Admission: RE | Admit: 2009-02-04 | Discharge: 2009-02-04 | Payer: Self-pay | Admitting: Neurosurgery

## 2009-02-05 ENCOUNTER — Encounter: Admission: RE | Admit: 2009-02-05 | Discharge: 2009-02-05 | Payer: Self-pay | Admitting: Neurosurgery

## 2009-02-06 ENCOUNTER — Telehealth (INDEPENDENT_AMBULATORY_CARE_PROVIDER_SITE_OTHER): Payer: Self-pay | Admitting: *Deleted

## 2009-02-11 ENCOUNTER — Ambulatory Visit: Payer: Self-pay | Admitting: Gynecology

## 2009-02-13 ENCOUNTER — Ambulatory Visit: Payer: Self-pay | Admitting: Gynecology

## 2009-02-24 ENCOUNTER — Ambulatory Visit: Payer: Self-pay | Admitting: Pulmonary Disease

## 2009-02-24 LAB — CONVERTED CEMR LAB: Vit D, 25-Hydroxy: 23 ng/mL — ABNORMAL LOW (ref 30–89)

## 2009-03-02 LAB — CONVERTED CEMR LAB
Albumin: 3.6 g/dL (ref 3.5–5.2)
Alkaline Phosphatase: 91 units/L (ref 39–117)
Basophils Absolute: 0 10*3/uL (ref 0.0–0.1)
Bilirubin, Direct: 0.1 mg/dL (ref 0.0–0.3)
CO2: 28 meq/L (ref 19–32)
Calcium: 8.8 mg/dL (ref 8.4–10.5)
Cholesterol: 218 mg/dL — ABNORMAL HIGH (ref 0–200)
Creatinine, Ser: 0.9 mg/dL (ref 0.4–1.2)
Eosinophils Absolute: 0.3 10*3/uL (ref 0.0–0.7)
Glucose, Bld: 106 mg/dL — ABNORMAL HIGH (ref 70–99)
Lymphocytes Relative: 26.3 % (ref 12.0–46.0)
MCHC: 34 g/dL (ref 30.0–36.0)
Neutrophils Relative %: 63.2 % (ref 43.0–77.0)
Platelets: 223 10*3/uL (ref 150.0–400.0)
RDW: 15.7 % — ABNORMAL HIGH (ref 11.5–14.6)
Specific Gravity, Urine: 1.015 (ref 1.000–1.030)
Total CHOL/HDL Ratio: 7
Total Protein, Urine: NEGATIVE mg/dL
Triglycerides: 394 mg/dL — ABNORMAL HIGH (ref 0.0–149.0)
Urine Glucose: NEGATIVE mg/dL
Urobilinogen, UA: 0.2 (ref 0.0–1.0)

## 2009-03-04 ENCOUNTER — Encounter: Admission: RE | Admit: 2009-03-04 | Discharge: 2009-03-04 | Payer: Self-pay | Admitting: Neurosurgery

## 2009-03-26 ENCOUNTER — Telehealth: Payer: Self-pay | Admitting: Gastroenterology

## 2009-03-31 ENCOUNTER — Telehealth: Payer: Self-pay | Admitting: Gastroenterology

## 2009-04-01 ENCOUNTER — Telehealth: Payer: Self-pay | Admitting: Pulmonary Disease

## 2009-04-07 ENCOUNTER — Encounter: Payer: Self-pay | Admitting: Pulmonary Disease

## 2009-04-16 ENCOUNTER — Ambulatory Visit (HOSPITAL_COMMUNITY): Admission: RE | Admit: 2009-04-16 | Discharge: 2009-04-16 | Payer: Self-pay | Admitting: Gastroenterology

## 2009-04-22 ENCOUNTER — Encounter: Payer: Self-pay | Admitting: Pulmonary Disease

## 2009-04-28 ENCOUNTER — Telehealth: Payer: Self-pay | Admitting: Gastroenterology

## 2009-05-05 ENCOUNTER — Encounter: Payer: Self-pay | Admitting: Pulmonary Disease

## 2009-05-09 ENCOUNTER — Encounter: Admission: RE | Admit: 2009-05-09 | Discharge: 2009-05-09 | Payer: Self-pay | Admitting: Gastroenterology

## 2009-05-29 ENCOUNTER — Encounter: Payer: Self-pay | Admitting: Pulmonary Disease

## 2009-06-03 ENCOUNTER — Ambulatory Visit: Payer: Self-pay | Admitting: Gynecology

## 2009-06-06 ENCOUNTER — Encounter: Payer: Self-pay | Admitting: Gynecology

## 2009-06-06 ENCOUNTER — Ambulatory Visit (HOSPITAL_BASED_OUTPATIENT_CLINIC_OR_DEPARTMENT_OTHER): Admission: RE | Admit: 2009-06-06 | Discharge: 2009-06-06 | Payer: Self-pay | Admitting: Gynecology

## 2009-06-06 ENCOUNTER — Encounter: Payer: Self-pay | Admitting: Internal Medicine

## 2009-06-06 ENCOUNTER — Ambulatory Visit: Payer: Self-pay | Admitting: Gynecology

## 2009-06-17 ENCOUNTER — Ambulatory Visit: Payer: Self-pay | Admitting: Gynecology

## 2009-06-20 ENCOUNTER — Ambulatory Visit: Payer: Self-pay | Admitting: Gynecology

## 2009-07-01 ENCOUNTER — Ambulatory Visit: Payer: Self-pay | Admitting: Internal Medicine

## 2009-07-01 DIAGNOSIS — R5383 Other fatigue: Secondary | ICD-10-CM

## 2009-07-01 DIAGNOSIS — R5381 Other malaise: Secondary | ICD-10-CM

## 2009-07-01 HISTORY — DX: Other malaise: R53.81

## 2009-07-07 ENCOUNTER — Encounter: Payer: Self-pay | Admitting: Pulmonary Disease

## 2009-07-21 ENCOUNTER — Ambulatory Visit: Payer: Self-pay | Admitting: Pulmonary Disease

## 2009-07-21 DIAGNOSIS — G4710 Hypersomnia, unspecified: Secondary | ICD-10-CM

## 2009-07-23 ENCOUNTER — Ambulatory Visit: Payer: Self-pay | Admitting: Pulmonary Disease

## 2009-07-25 ENCOUNTER — Encounter: Payer: Self-pay | Admitting: Pulmonary Disease

## 2009-07-25 ENCOUNTER — Ambulatory Visit (HOSPITAL_BASED_OUTPATIENT_CLINIC_OR_DEPARTMENT_OTHER): Admission: RE | Admit: 2009-07-25 | Discharge: 2009-07-25 | Payer: Self-pay | Admitting: Pulmonary Disease

## 2009-07-26 LAB — CONVERTED CEMR LAB
ALT: 38 units/L — ABNORMAL HIGH (ref 0–35)
Albumin: 3.8 g/dL (ref 3.5–5.2)
Alpha-1-Globulin: 4 % (ref 2.9–4.9)
Alpha-2-Globulin: 12.7 % — ABNORMAL HIGH (ref 7.1–11.8)
BUN: 11 mg/dL (ref 6–23)
Basophils Relative: 1 % (ref 0.0–3.0)
Beta Globulin: 6.9 % (ref 4.7–7.2)
Chloride: 103 meq/L (ref 96–112)
Direct LDL: 144.2 mg/dL
Eosinophils Relative: 6 % — ABNORMAL HIGH (ref 0.0–5.0)
HCT: 36.2 % (ref 36.0–46.0)
Hemoglobin: 12.5 g/dL (ref 12.0–15.0)
Ketones, ur: NEGATIVE mg/dL
Lymphs Abs: 1.7 10*3/uL (ref 0.7–4.0)
MCV: 82.5 fL (ref 78.0–100.0)
Monocytes Absolute: 0.3 10*3/uL (ref 0.1–1.0)
Neutro Abs: 4 10*3/uL (ref 1.4–7.7)
Platelets: 261 10*3/uL (ref 150.0–400.0)
Potassium: 4 meq/L (ref 3.5–5.1)
RBC: 4.4 M/uL (ref 3.87–5.11)
Sodium: 141 meq/L (ref 135–145)
Specific Gravity, Urine: 1.01 (ref 1.000–1.030)
TSH: 2.1 microintl units/mL (ref 0.35–5.50)
Total Protein, Serum Electrophoresis: 7.5 g/dL (ref 6.0–8.3)
Total Protein: 7.8 g/dL (ref 6.0–8.3)
Urine Glucose: NEGATIVE mg/dL
VLDL: 113.6 mg/dL — ABNORMAL HIGH (ref 0.0–40.0)
Vit D, 25-Hydroxy: 35 ng/mL (ref 30–89)
Vitamin B-12: 360 pg/mL (ref 211–911)
WBC: 6.5 10*3/uL (ref 4.5–10.5)
pH: 6 (ref 5.0–8.0)

## 2009-07-27 ENCOUNTER — Ambulatory Visit: Payer: Self-pay | Admitting: Pulmonary Disease

## 2009-07-28 ENCOUNTER — Telehealth (INDEPENDENT_AMBULATORY_CARE_PROVIDER_SITE_OTHER): Payer: Self-pay | Admitting: *Deleted

## 2009-07-30 ENCOUNTER — Ambulatory Visit: Payer: Self-pay | Admitting: Hematology & Oncology

## 2009-07-31 ENCOUNTER — Ambulatory Visit: Payer: Self-pay | Admitting: Internal Medicine

## 2009-08-05 ENCOUNTER — Encounter: Payer: Self-pay | Admitting: Pulmonary Disease

## 2009-08-13 ENCOUNTER — Encounter: Payer: Self-pay | Admitting: Pulmonary Disease

## 2009-08-13 LAB — CBC WITH DIFFERENTIAL (CANCER CENTER ONLY)
Eosinophils Absolute: 0.4 10*3/uL (ref 0.0–0.5)
HCT: 34.5 % — ABNORMAL LOW (ref 34.8–46.6)
LYMPH%: 30.1 % (ref 14.0–48.0)
MCH: 27.3 pg (ref 26.0–34.0)
MCV: 79 fL — ABNORMAL LOW (ref 81–101)
MONO#: 0.3 10*3/uL (ref 0.1–0.9)
MONO%: 4.2 % (ref 0.0–13.0)
NEUT%: 58.8 % (ref 39.6–80.0)
Platelets: 239 10*3/uL (ref 145–400)
RBC: 4.36 10*6/uL (ref 3.70–5.32)
WBC: 6.7 10*3/uL (ref 3.9–10.0)

## 2009-08-18 ENCOUNTER — Ambulatory Visit: Payer: Self-pay | Admitting: Gynecology

## 2009-08-18 ENCOUNTER — Other Ambulatory Visit: Admission: RE | Admit: 2009-08-18 | Discharge: 2009-08-18 | Payer: Self-pay | Admitting: Gynecology

## 2009-08-18 LAB — SPEP & IFE WITH QIG
Alpha-1-Globulin: 4.3 % (ref 2.9–4.9)
Alpha-2-Globulin: 10.9 % (ref 7.1–11.8)
Beta Globulin: 7.4 % — ABNORMAL HIGH (ref 4.7–7.2)
Gamma Globulin: 13.3 % (ref 11.1–18.8)
IgG (Immunoglobin G), Serum: 859 mg/dL (ref 694–1618)
M-Spike, %: 0.35 g/dL

## 2009-08-18 LAB — LACTATE DEHYDROGENASE: LDH: 147 U/L (ref 94–250)

## 2009-08-18 LAB — KAPPA/LAMBDA LIGHT CHAINS
Kappa free light chain: 0.75 mg/dL (ref 0.33–1.94)
Kappa:Lambda Ratio: 0.48 (ref 0.26–1.65)

## 2009-08-18 LAB — COMPREHENSIVE METABOLIC PANEL
Alkaline Phosphatase: 95 U/L (ref 39–117)
BUN: 11 mg/dL (ref 6–23)
CO2: 24 mEq/L (ref 19–32)
Creatinine, Ser: 0.97 mg/dL (ref 0.40–1.20)
Glucose, Bld: 107 mg/dL — ABNORMAL HIGH (ref 70–99)
Total Bilirubin: 0.4 mg/dL (ref 0.3–1.2)

## 2009-08-18 LAB — BETA 2 MICROGLOBULIN, SERUM: Beta-2 Microglobulin: 2.11 mg/L — ABNORMAL HIGH (ref 1.01–1.73)

## 2009-08-19 ENCOUNTER — Ambulatory Visit: Payer: Self-pay | Admitting: Gynecology

## 2009-08-19 ENCOUNTER — Telehealth: Payer: Self-pay | Admitting: Pulmonary Disease

## 2009-08-20 ENCOUNTER — Telehealth: Payer: Self-pay | Admitting: Pulmonary Disease

## 2009-08-21 ENCOUNTER — Telehealth: Payer: Self-pay | Admitting: Pulmonary Disease

## 2009-08-28 ENCOUNTER — Ambulatory Visit: Payer: Self-pay | Admitting: Pulmonary Disease

## 2009-08-28 DIAGNOSIS — G4737 Central sleep apnea in conditions classified elsewhere: Secondary | ICD-10-CM | POA: Insufficient documentation

## 2009-08-28 DIAGNOSIS — G4733 Obstructive sleep apnea (adult) (pediatric): Secondary | ICD-10-CM

## 2009-09-01 ENCOUNTER — Encounter: Payer: Self-pay | Admitting: Pulmonary Disease

## 2009-09-01 ENCOUNTER — Ambulatory Visit (HOSPITAL_BASED_OUTPATIENT_CLINIC_OR_DEPARTMENT_OTHER): Admission: RE | Admit: 2009-09-01 | Discharge: 2009-09-01 | Payer: Self-pay | Admitting: Pulmonary Disease

## 2009-09-02 ENCOUNTER — Telehealth: Payer: Self-pay | Admitting: Pulmonary Disease

## 2009-09-04 ENCOUNTER — Ambulatory Visit: Payer: Self-pay | Admitting: Pulmonary Disease

## 2009-09-08 ENCOUNTER — Telehealth: Payer: Self-pay | Admitting: Pulmonary Disease

## 2009-09-16 ENCOUNTER — Telehealth: Payer: Self-pay | Admitting: Pulmonary Disease

## 2009-09-22 ENCOUNTER — Encounter: Payer: Self-pay | Admitting: Pulmonary Disease

## 2009-09-23 ENCOUNTER — Telehealth: Payer: Self-pay | Admitting: Pulmonary Disease

## 2009-09-26 ENCOUNTER — Telehealth: Payer: Self-pay | Admitting: Pulmonary Disease

## 2009-10-06 ENCOUNTER — Telehealth: Payer: Self-pay | Admitting: Pulmonary Disease

## 2009-10-13 ENCOUNTER — Encounter: Payer: Self-pay | Admitting: Pulmonary Disease

## 2009-10-20 ENCOUNTER — Ambulatory Visit: Payer: Self-pay | Admitting: Internal Medicine

## 2009-10-20 LAB — CONVERTED CEMR LAB
ALT: 19 units/L (ref 0–35)
Bilirubin, Direct: 0.1 mg/dL (ref 0.0–0.3)
HDL: 43 mg/dL (ref >39.00)
Total Bilirubin: 0.4 mg/dL (ref 0.3–1.2)
Total CHOL/HDL Ratio: 3
VLDL: 36.2 mg/dL (ref 0.0–40.0)

## 2009-10-21 ENCOUNTER — Telehealth (INDEPENDENT_AMBULATORY_CARE_PROVIDER_SITE_OTHER): Payer: Self-pay | Admitting: *Deleted

## 2009-10-23 ENCOUNTER — Ambulatory Visit: Payer: Self-pay | Admitting: Cardiology

## 2009-11-03 ENCOUNTER — Encounter: Payer: Self-pay | Admitting: Pulmonary Disease

## 2009-12-18 ENCOUNTER — Ambulatory Visit: Payer: Self-pay | Admitting: Gynecology

## 2009-12-23 ENCOUNTER — Ambulatory Visit: Payer: Self-pay | Admitting: Hematology & Oncology

## 2009-12-25 ENCOUNTER — Encounter: Payer: Self-pay | Admitting: Pulmonary Disease

## 2009-12-25 LAB — CBC WITH DIFFERENTIAL (CANCER CENTER ONLY)
BASO%: 0.4 % (ref 0.0–2.0)
EOS%: 3.3 % (ref 0.0–7.0)
LYMPH#: 2 10*3/uL (ref 0.9–3.3)
MCHC: 32.2 g/dL (ref 32.0–36.0)
NEUT#: 4.3 10*3/uL (ref 1.5–6.5)
RDW: 12.8 % (ref 10.5–14.6)

## 2009-12-29 LAB — SPEP & IFE WITH QIG
Albumin ELP: 53.6 % — ABNORMAL LOW (ref 55.8–66.1)
Alpha-1-Globulin: 7.6 % — ABNORMAL HIGH (ref 2.9–4.9)
Alpha-2-Globulin: 13.2 % — ABNORMAL HIGH (ref 7.1–11.8)
Beta Globulin: 7.2 % (ref 4.7–7.2)
M-Spike, %: 0.41 g/dL
Total Protein, Serum Electrophoresis: 7 g/dL (ref 6.0–8.3)

## 2009-12-29 LAB — KAPPA/LAMBDA LIGHT CHAINS: Kappa free light chain: 0.74 mg/dL (ref 0.33–1.94)

## 2010-01-15 ENCOUNTER — Ambulatory Visit: Payer: Self-pay | Admitting: Diagnostic Radiology

## 2010-01-15 ENCOUNTER — Ambulatory Visit (HOSPITAL_BASED_OUTPATIENT_CLINIC_OR_DEPARTMENT_OTHER): Admission: RE | Admit: 2010-01-15 | Discharge: 2010-01-15 | Payer: Self-pay | Admitting: Hematology & Oncology

## 2010-01-15 LAB — CBC WITH DIFFERENTIAL (CANCER CENTER ONLY)
BASO#: 0 10*3/uL (ref 0.0–0.2)
Eosinophils Absolute: 0.1 10*3/uL (ref 0.0–0.5)
HCT: 36.6 % (ref 34.8–46.6)
HGB: 12.2 g/dL (ref 11.6–15.9)
LYMPH%: 21.7 % (ref 14.0–48.0)
MCH: 27.1 pg (ref 26.0–34.0)
MCV: 81 fL (ref 81–101)
MONO#: 0.1 10*3/uL (ref 0.1–0.9)
Platelets: 240 10*3/uL (ref 145–400)
RBC: 4.52 10*6/uL (ref 3.70–5.32)
WBC: 6.2 10*3/uL (ref 3.9–10.0)

## 2010-01-15 LAB — CMP (CANCER CENTER ONLY)
Albumin: 3.8 g/dL (ref 3.3–5.5)
BUN, Bld: 16 mg/dL (ref 7–22)
CO2: 30 mEq/L (ref 18–33)
Glucose, Bld: 122 mg/dL — ABNORMAL HIGH (ref 73–118)
Sodium: 145 mEq/L (ref 128–145)
Total Bilirubin: 0.6 mg/dl (ref 0.20–1.60)
Total Protein: 7.9 g/dL (ref 6.4–8.1)

## 2010-01-19 LAB — PROTEIN ELECTROPHORESIS, SERUM
Albumin ELP: 52.5 % — ABNORMAL LOW (ref 55.8–66.1)
Alpha-1-Globulin: 8.1 % — ABNORMAL HIGH (ref 2.9–4.9)
Alpha-2-Globulin: 12.2 % — ABNORMAL HIGH (ref 7.1–11.8)
Beta 2: 6.6 % — ABNORMAL HIGH (ref 3.2–6.5)
Beta Globulin: 7.5 % — ABNORMAL HIGH (ref 4.7–7.2)
Gamma Globulin: 13.1 % (ref 11.1–18.8)
M-Spike, %: 0.48 g/dL
Total Protein, Serum Electrophoresis: 7.7 g/dL (ref 6.0–8.3)

## 2010-01-19 LAB — IGG, IGA, IGM
IgA: 308 mg/dL (ref 68–378)
IgG (Immunoglobin G), Serum: 720 mg/dL (ref 694–1618)
IgM, Serum: 508 mg/dL — ABNORMAL HIGH (ref 60–263)

## 2010-01-21 ENCOUNTER — Encounter: Payer: Self-pay | Admitting: Pulmonary Disease

## 2010-01-26 ENCOUNTER — Ambulatory Visit: Payer: Self-pay | Admitting: Pulmonary Disease

## 2010-01-26 DIAGNOSIS — G4730 Sleep apnea, unspecified: Secondary | ICD-10-CM | POA: Insufficient documentation

## 2010-02-01 LAB — CONVERTED CEMR LAB: Vit D, 25-Hydroxy: 70 ng/mL (ref 30–89)

## 2010-04-03 ENCOUNTER — Ambulatory Visit: Payer: Self-pay | Admitting: Cardiovascular Disease

## 2010-04-06 LAB — CONVERTED CEMR LAB
ALT: 17 units/L (ref 0–35)
AST: 22 units/L (ref 0–37)
Alkaline Phosphatase: 85 units/L (ref 39–117)
Bilirubin, Direct: 0.1 mg/dL (ref 0.0–0.3)
Cholesterol: 187 mg/dL (ref 0–200)
Direct LDL: 105.6 mg/dL
Total Bilirubin: 0.4 mg/dL (ref 0.3–1.2)
Total CHOL/HDL Ratio: 6
VLDL: 80.2 mg/dL — ABNORMAL HIGH (ref 0.0–40.0)

## 2010-04-09 ENCOUNTER — Ambulatory Visit: Payer: Self-pay | Admitting: Cardiology

## 2010-04-20 ENCOUNTER — Ambulatory Visit (HOSPITAL_COMMUNITY): Admission: RE | Admit: 2010-04-20 | Discharge: 2010-04-20 | Payer: Self-pay | Admitting: Anesthesiology

## 2010-05-04 ENCOUNTER — Ambulatory Visit (HOSPITAL_COMMUNITY): Admission: RE | Admit: 2010-05-04 | Discharge: 2010-05-04 | Payer: Self-pay | Admitting: Gynecology

## 2010-05-05 ENCOUNTER — Ambulatory Visit: Payer: Self-pay | Admitting: Gynecology

## 2010-05-13 ENCOUNTER — Ambulatory Visit: Payer: Self-pay | Admitting: Hematology & Oncology

## 2010-05-15 ENCOUNTER — Encounter: Payer: Self-pay | Admitting: Pulmonary Disease

## 2010-05-15 LAB — CBC WITH DIFFERENTIAL (CANCER CENTER ONLY)
BASO#: 0 10*3/uL (ref 0.0–0.2)
EOS%: 8.1 % — ABNORMAL HIGH (ref 0.0–7.0)
HCT: 37 % (ref 34.8–46.6)
HGB: 12.3 g/dL (ref 11.6–15.9)
LYMPH#: 3.1 10*3/uL (ref 0.9–3.3)
LYMPH%: 47.7 % (ref 14.0–48.0)
MCH: 27.3 pg (ref 26.0–34.0)
MCHC: 33.3 g/dL (ref 32.0–36.0)
MCV: 82 fL (ref 81–101)
NEUT%: 38 % — ABNORMAL LOW (ref 39.6–80.0)

## 2010-05-19 LAB — SPEP & IFE WITH QIG
Beta 2: 6.7 % — ABNORMAL HIGH (ref 3.2–6.5)
Gamma Globulin: 13 % (ref 11.1–18.8)
IgA: 284 mg/dL (ref 68–378)
IgM, Serum: 550 mg/dL — ABNORMAL HIGH (ref 60–263)

## 2010-05-19 LAB — COMPREHENSIVE METABOLIC PANEL
Albumin: 4.1 g/dL (ref 3.5–5.2)
Alkaline Phosphatase: 95 U/L (ref 39–117)
BUN: 18 mg/dL (ref 6–23)
Calcium: 9 mg/dL (ref 8.4–10.5)
Chloride: 105 mEq/L (ref 96–112)
Glucose, Bld: 104 mg/dL — ABNORMAL HIGH (ref 70–99)
Potassium: 4 mEq/L (ref 3.5–5.3)

## 2010-05-27 ENCOUNTER — Telehealth (INDEPENDENT_AMBULATORY_CARE_PROVIDER_SITE_OTHER): Payer: Self-pay | Admitting: *Deleted

## 2010-05-28 ENCOUNTER — Ambulatory Visit: Payer: Self-pay | Admitting: Pulmonary Disease

## 2010-05-28 DIAGNOSIS — L299 Pruritus, unspecified: Secondary | ICD-10-CM | POA: Insufficient documentation

## 2010-05-28 DIAGNOSIS — R21 Rash and other nonspecific skin eruption: Secondary | ICD-10-CM

## 2010-05-28 LAB — CONVERTED CEMR LAB
Basophils Relative: 0.2 % (ref 0.0–3.0)
Eosinophils Absolute: 0.3 10*3/uL (ref 0.0–0.7)
Eosinophils Relative: 5.1 % — ABNORMAL HIGH (ref 0.0–5.0)
Hemoglobin: 12.9 g/dL (ref 12.0–15.0)
Lymphocytes Relative: 35.2 % (ref 12.0–46.0)
MCHC: 34.2 g/dL (ref 30.0–36.0)
Monocytes Relative: 5.7 % (ref 3.0–12.0)
Neutro Abs: 3.5 10*3/uL (ref 1.4–7.7)
Neutrophils Relative %: 53.8 % (ref 43.0–77.0)
RBC: 4.52 M/uL (ref 3.87–5.11)
WBC: 6.6 10*3/uL (ref 4.5–10.5)

## 2010-07-07 ENCOUNTER — Ambulatory Visit: Payer: Self-pay | Admitting: Internal Medicine

## 2010-07-07 ENCOUNTER — Telehealth (INDEPENDENT_AMBULATORY_CARE_PROVIDER_SITE_OTHER): Payer: Self-pay | Admitting: Pharmacist

## 2010-07-08 LAB — CONVERTED CEMR LAB
AST: 19 units/L (ref 0–37)
Albumin: 3.7 g/dL (ref 3.5–5.2)
Alkaline Phosphatase: 82 units/L (ref 39–117)
Bilirubin, Direct: 0.1 mg/dL (ref 0.0–0.3)
Direct LDL: 79.4 mg/dL
HDL: 33.4 mg/dL — ABNORMAL LOW (ref >39.00)
Total Protein: 6.6 g/dL (ref 6.0–8.3)

## 2010-07-09 ENCOUNTER — Ambulatory Visit: Payer: Self-pay | Admitting: Internal Medicine

## 2010-07-14 LAB — CONVERTED CEMR LAB: Total CK: 70 units/L (ref 7–177)

## 2010-07-29 ENCOUNTER — Ambulatory Visit: Payer: Self-pay | Admitting: Pulmonary Disease

## 2010-07-30 LAB — CONVERTED CEMR LAB
ALT: 18 units/L (ref 0–35)
AST: 16 units/L (ref 0–37)
Albumin: 3.9 g/dL (ref 3.5–5.2)
Alkaline Phosphatase: 95 units/L (ref 39–117)
BUN: 14 mg/dL (ref 6–23)
Basophils Absolute: 0 10*3/uL (ref 0.0–0.1)
Basophils Relative: 0.4 % (ref 0.0–3.0)
Bilirubin Urine: NEGATIVE
Bilirubin, Direct: 0.1 mg/dL (ref 0.0–0.3)
CO2: 29 meq/L (ref 19–32)
Calcium: 9.3 mg/dL (ref 8.4–10.5)
Chloride: 107 meq/L (ref 96–112)
Creatinine, Ser: 0.8 mg/dL (ref 0.4–1.2)
Eosinophils Absolute: 0.4 10*3/uL (ref 0.0–0.7)
Eosinophils Relative: 6.2 % — ABNORMAL HIGH (ref 0.0–5.0)
GFR calc non Af Amer: 72.63 mL/min (ref >60)
Glucose, Bld: 98 mg/dL (ref 70–99)
HCT: 37.7 % (ref 36.0–46.0)
Hemoglobin, Urine: NEGATIVE
Hemoglobin: 12.8 g/dL (ref 12.0–15.0)
Ketones, ur: NEGATIVE mg/dL
Leukocytes, UA: NEGATIVE
Lymphocytes Relative: 39.1 % (ref 12.0–46.0)
Lymphs Abs: 2.7 10*3/uL (ref 0.7–4.0)
MCHC: 33.9 g/dL (ref 30.0–36.0)
MCV: 85.4 fL (ref 78.0–100.0)
Monocytes Absolute: 0.4 10*3/uL (ref 0.1–1.0)
Monocytes Relative: 5.2 % (ref 3.0–12.0)
Neutro Abs: 3.3 10*3/uL (ref 1.4–7.7)
Neutrophils Relative %: 49.1 % (ref 43.0–77.0)
Nitrite: NEGATIVE
Platelets: 228 10*3/uL (ref 150.0–400.0)
Potassium: 4.7 meq/L (ref 3.5–5.1)
RBC: 4.42 M/uL (ref 3.87–5.11)
RDW: 14.3 % (ref 11.5–14.6)
Sed Rate: 39 mm/hr — ABNORMAL HIGH (ref 0–22)
Sodium: 143 meq/L (ref 135–145)
Specific Gravity, Urine: >=1.03 (ref 1.000–1.030)
TSH: 4.32 microintl units/mL (ref 0.35–5.50)
Total Bilirubin: 0.6 mg/dL (ref 0.3–1.2)
Total Protein, Urine: NEGATIVE mg/dL
Total Protein: 6.9 g/dL (ref 6.0–8.3)
Urine Glucose: NEGATIVE mg/dL
Urobilinogen, UA: 0.2 (ref 0.0–1.0)
WBC: 6.8 10*3/uL (ref 4.5–10.5)
pH: 5 (ref 5.0–8.0)

## 2010-08-11 ENCOUNTER — Encounter: Payer: Self-pay | Admitting: Pulmonary Disease

## 2010-09-03 ENCOUNTER — Ambulatory Visit: Payer: Self-pay | Admitting: Gynecology

## 2010-09-03 ENCOUNTER — Other Ambulatory Visit
Admission: RE | Admit: 2010-09-03 | Discharge: 2010-09-03 | Payer: Self-pay | Source: Home / Self Care | Admitting: Gynecology

## 2010-09-28 ENCOUNTER — Telehealth: Payer: Self-pay | Admitting: Pulmonary Disease

## 2010-09-30 ENCOUNTER — Ambulatory Visit: Payer: Self-pay | Admitting: Hematology & Oncology

## 2010-10-02 ENCOUNTER — Encounter: Payer: Self-pay | Admitting: Pulmonary Disease

## 2010-10-02 LAB — CBC WITH DIFFERENTIAL (CANCER CENTER ONLY)
BASO#: 0.1 10*3/uL (ref 0.0–0.2)
BASO%: 0.7 % (ref 0.0–2.0)
EOS%: 7.1 % — ABNORMAL HIGH (ref 0.0–7.0)
Eosinophils Absolute: 0.6 10*3/uL — ABNORMAL HIGH (ref 0.0–0.5)
HCT: 40.2 % (ref 34.8–46.6)
HGB: 13.4 g/dL (ref 11.6–15.9)
LYMPH#: 3.1 10*3/uL (ref 0.9–3.3)
LYMPH%: 38.7 % (ref 14.0–48.0)
MCH: 28.4 pg (ref 26.0–34.0)
MCHC: 33.5 g/dL (ref 32.0–36.0)
MCV: 85 fL (ref 81–101)
MONO#: 0.5 10*3/uL (ref 0.1–0.9)
MONO%: 5.7 % (ref 0.0–13.0)
NEUT#: 3.9 10*3/uL (ref 1.5–6.5)
NEUT%: 47.8 % (ref 39.6–80.0)
Platelets: 265 10*3/uL (ref 145–400)
RBC: 4.73 10*6/uL (ref 3.70–5.32)
RDW: 13 % (ref 10.5–14.6)
WBC: 8.1 10*3/uL (ref 3.9–10.0)

## 2010-10-06 ENCOUNTER — Ambulatory Visit: Admit: 2010-10-06 | Payer: Self-pay

## 2010-10-06 LAB — COMPREHENSIVE METABOLIC PANEL
ALT: 11 U/L (ref 0–35)
AST: 13 U/L (ref 0–37)
Albumin: 4.5 g/dL (ref 3.5–5.2)
Alkaline Phosphatase: 111 U/L (ref 39–117)
BUN: 24 mg/dL — ABNORMAL HIGH (ref 6–23)
CO2: 22 mEq/L (ref 19–32)
Calcium: 9.6 mg/dL (ref 8.4–10.5)
Chloride: 103 mEq/L (ref 96–112)
Creatinine, Ser: 1.02 mg/dL (ref 0.40–1.20)
Glucose, Bld: 115 mg/dL — ABNORMAL HIGH (ref 70–99)
Potassium: 4.3 mEq/L (ref 3.5–5.3)
Sodium: 137 mEq/L (ref 135–145)
Total Bilirubin: 0.4 mg/dL (ref 0.3–1.2)
Total Protein: 7.8 g/dL (ref 6.0–8.3)

## 2010-10-06 LAB — SPEP & IFE WITH QIG
Albumin ELP: 54.3 % — ABNORMAL LOW (ref 55.8–66.1)
Alpha-1-Globulin: 4.3 % (ref 2.9–4.9)
Alpha-2-Globulin: 13.5 % — ABNORMAL HIGH (ref 7.1–11.8)
Beta 2: 7.6 % — ABNORMAL HIGH (ref 3.2–6.5)
Beta Globulin: 7.2 % (ref 4.7–7.2)
Gamma Globulin: 13.1 % (ref 11.1–18.8)
IgA: 377 mg/dL (ref 68–378)
IgG (Immunoglobin G), Serum: 982 mg/dL (ref 694–1618)
IgM, Serum: 686 mg/dL — ABNORMAL HIGH (ref 60–263)
M-Spike, %: 0.48 g/dL
Total Protein, Serum Electrophoresis: 7.8 g/dL (ref 6.0–8.3)

## 2010-10-06 LAB — KAPPA/LAMBDA LIGHT CHAINS
Kappa free light chain: 1.46 mg/dL (ref 0.33–1.94)
Kappa:Lambda Ratio: 0.68 (ref 0.26–1.65)
Lambda Free Lght Chn: 2.15 mg/dL (ref 0.57–2.63)

## 2010-10-08 ENCOUNTER — Ambulatory Visit: Admit: 2010-10-08 | Payer: Self-pay

## 2010-10-10 ENCOUNTER — Encounter: Payer: Self-pay | Admitting: Neurosurgery

## 2010-10-22 NOTE — Letter (Signed)
Summary: [facility]  [facility]   Imported By: Sherian Rein 06/04/2010 13:47:31  _____________________________________________________________________  External Attachment:    Type:   Image     Comment:   External Document

## 2010-10-22 NOTE — Progress Notes (Signed)
Summary: rx  Phone Note Call from Patient Call back at Home Phone [phone]   Caller: Spouse Call For: [name] Reason for Call: Acute Illness, Talk to Nurse Summary of Call: Need generic Avelox called in instead of name brand.  Avelox cos$70 for 7 pills, can't afford. [facility] Initial call taken by: Eugene Gavia,  September 23, 2009 8:52 AM  Follow-up for Phone Call        please advise. Carron Curie CMA  September 23, 2009 10:10 A    per SN----there is no generic for avelox.   she had a zpak---she can try generic keflex 500mg   #20  1 by mouth 1 by mouth four times daily until gone.  thanks Randell Loop CMA  September 23, 2009 10:26 AM   rx sent. pt aware. Carron Curie CMA  September 23, 2009 10:34 AM     New/Updated Medications: KEFLEX 500 MG CAPS (CEPHALEXIN) Take 1 tablet by mouth four times a day Prescriptions: KEFLEX 500 MG CAPS (CEPHALEXIN) Take 1 tablet by mouth four times a day  #20 x 0   Entered by:   Carron Curie CMA   Authorized by:   Michele Mcalpine MD   Signed by:   Carron Curie CMA on 09/23/2009   Method used:   [facility]ronically to        [phone][facility] (retail)       [address]       [place], [place]  [address]       Ph: [phone] or [phone]       Fax: [phone]   RxID:   [id number]

## 2010-10-22 NOTE — Progress Notes (Signed)
Summary: rx  Phone Note Call from Patient Call back at Home Phone [phone]   Caller: Patient Call For: Shakenna Herrero Reason for Call: Talk to Nurse Summary of Call: congestion w/bad cough.  Constant, non productive.  Need something for cough. [facility] Initial call taken by: Eugene Gavia,  September 26, 2009 8:57 AM  Follow-up for Phone Call        Pt c/o cough sometimes productive with green mucus, worse at night when laying down, chest congestion, and pt denies fever. Pt states she has been using Magic Mouthwash that was left over from a previous Rx from Dr. Jarold Motto, I advised pt that does not treat cough and it is for thrush and infections of the mouth. Pt has been using Halls Sugar-Free cough drops. Please advise. Zackery Barefoot CMA  September 26, 2009 9:16 AM   Additional Follow-up for Phone Call Additional follow up Details #1::        per SN---MMW is what she was using for her throat-- SN rec tussionex 4 oz  1 tsp by mouth every 12 hours as needed for cough and use mucinex max 1 by mouth two times a day with plenty of fluids.  thanks Randell Loop CMA  September 26, 2009 11:54 AM     Additional Follow-up for Phone Call Additional follow up Details #2::    Rx called to pharmacy and pt advised.Zackery Barefoot CMA  September 26, 2009 12:06 PM   New/Updated Medications: Sandria Senter ER 8-10 MG/5ML LQCR (CHLORPHENIRAMINE-HYDROCODONE) 1 tsp every 12 hours as needed for cough MUCINEX MAXIMUM STRENGTH 1200 MG XR12H-TAB (GUAIFENESIN) Take 1 tablet by mouth two times a day Prescriptions: TUSSIONEX PENNKINETIC ER 8-10 MG/5ML LQCR (CHLORPHENIRAMINE-HYDROCODONE) 1 tsp every 12 hours as needed for cough  #4oz x 0   Entered by:   Zackery Barefoot CMA   Authorized by:   Michele Mcalpine MD   Signed by:   Zackery Barefoot CMA on 09/26/2009   Method used:   Telephoned to ...       [facility] [address] (retail)       [address]       [place], [place]  [address]       Ph: [phone]  or [phone]       Fax: [phone]   RxID:   [id number]

## 2010-10-22 NOTE — Medication Information (Signed)
Summary: [facility]  [facility]   Imported By: Sherian Rein 08/20/2010 12:00:13  _____________________________________________________________________  External Attachment:    Type:   Image     Comment:   External Document

## 2010-10-22 NOTE — Assessment & Plan Note (Signed)
Summary: ? reaction from tick bite//lmr   Primary Care Xylon Croom:  Alroy Dust, MD  CC:  4 month ROV & add-on to check tick bite reaction....  History of Present Illness: 65 y/o WF here for a follow up visit... she has mult med problems and is well known to [place]... also followed in the Lipid Clinic, and by numerous specialists as noted below...    ~  January 09, 2009:  urgent call today- depressed, crying, needs help... once again brought in by husb- pt can't/ won't say what is wrong... husb notes continued prob after 5 knee surgeries- last TKR revision at [facility] in Dec09... still in pain every day despite 4+ Percocet daily... "it pushed her over the edge" he says... several other medical evals recently as well--- we sent her to [facility] for eval.   [name] eval for persist N/V & GI symptoms- Abd Sonar- neg post GB... EGD showed 5cm HH w/ healing ulcer, esophagitis, gastritis, etc... HPylori neg... REC> continue Prilosec, Reglan, Zofran.   DrBensimhon eval for Cards w/ Myoview 4/10 showing low risk study w/ soft tissue attenuation, normal wall motion, EF= 78%...   ~  February 24, 2009:  she was seen at behavioral health & given appt w/ DrKaur for 03/06/09 )"I didn't need in patient treatment")... still taking ALPRAZOLAM 0.5mg  up to Tid (ave 1-2/d)... states that her FM has acted up and she is "hurting all over"... she was eval by DrPoole for LBP- MRI lumbar spine showed prev fusion OK- given Dosepak w/ some improvement... still c/o knee pain "[name] at [facility] said there is nothing else that can be done"- takes Percocet 1-2/d.[name].  sched for oopherectomy by DrFontaine in July...   ~  July 23, 2009:  she had the oopherectomy 9/10 w/ lysis of adhesions- "it was a mess" but she notes all nausea finally resolved after this procedure... she saw [name], [name] 8/10 for eval of nausea- no help from Reglan, Scopalamine, etc... had UGI/ Ba swallow- sm HH, mild reflux, norm peristalsis;  Gastric Emptying Scan-  normal;  EGD 9/10 by [facility] w/ esoph ulcer seen, neg CLO, benign gastric polyp removed- on PROTONIX 40mg Bid...  also saw DrBensimhon 10/10 for f/u HBP, CP- hx norm cath 2003, norm Myoview 4/10, limited functional capacity due to deconditioning & TKR... he referred to [name] for repeat OSA eval- seen 07/21/09 & repeat sleep study pending...   ~  Jan 26, 2010:  Sleep eval [name] w/ 11/10 study showing AHI= 25 w/ obstructive & central events... CPAP titration was suboptimal & she was tried on BiPAP... she tells me that she stopped all this when she lost weight & husb confirms no snoring or sleep disordered breathing- rests well etc...     She saw [name] for f/u MGUS & rising IgM level> he felt she was stable compared to his old values & rec CT Scans to r/o lymphoma (done 4/11 & neg- no lesions seen, mild hepatomegaly noted)...     She also followed by [name] for Urology w/ IC> on Elmiron orally & in bladder (she does self caths now)...     Also followed by [name] in the Pain Center now on referral from Ortho> tried on NUCYNTA ("too expensive")...     Also notes improvement in FLP/ lipid clinic f/u on Simva40 & weight loss via diet etc...   ~  May 28, 2010:  states tick bite 8/18 "[name] says it was a deer tic" on upper inner left bicep  area... given Doxy x7d per [name] & no systemic symptoms (no HA, fever, change in FM symptoms) but notes rash, itching, sweats, & pain in arm... on exam> no signif rash, sm red spot where she says tick was removed (no resid parts left), no erythema migrans etc... we discussed checking CBC & Lyme titers; Rx w/ Atarx for itching.   Current Problem List:  UNSPECIFIED SLEEP APNEA (ICD-780.57) - Complex sleep apnea - PSG 07/25/09 AHI 28 **SEE ABOVE**  ALLERGIC RHINITIS (ICD-477.9) Hx of SINUSITIS (ICD-473.9) - Hx sinusitis followed by [name] at [facility]...  HYPERTENSION (ICD-401.9) - now on METOPROLOL 50mg  Bid... she was hosp 3/09 w/ gastroenteritis & BP meds  were stopped due to weakness... grad restarted and increased w/ improvement in BP and palpitations...  today BP= 132/84- she is INTOL to Diovan... denies HA, visual symptoms, CP, palpit, dizzy, syncope, edema... husb notes that BPs are even better at home  ~120/70's...  CHEST PAIN (ICD-786.50) - seen by [name] in 2006...  ~  2DEcho 11/98 showed norm valves, norm LVF, ? min LVH...  ~  cath 7/03 w/ norm coronaries, & norm LVF...  ~  NuclearStressTest 2/06 showed no infarct, no ischemia, EF=65%...  ~  Myoview repeated 4/10: low risk study w/ soft tissue attenuation, normal wall motion, EF= 78%...  PALPITATIONS, HX OF (ICD-V12.50) - notes occas fluttering and "jello feeling" in her chest... Metoprolol helps.  HYPERCHOLESTEROLEMIA (ICD-272.0) - prev followed in the Lipid Clinic... titrated up to ZOCOR 40mg Qhs but she's noted incr aching/ soreness> these side effects have waned since she lost wt & now tol well.  ~  FLP 01/03/08 on SIMVASTATIN 20mg /d showed TChol 206, TG 182, HDL 31, LDL 145...  ~  FLP 6/09 (on Simva40) showed TChol 173, TG 199, HDL 31, LDL 102... continue f/u in lipid clinic.  ~  FLP 11/09 (on Simva40) showed TChol 196, TG 265, HDL 32, LDL 119... she stopped lipid clinic.  ~  Due to continued "side effects" she placed the Simvastatin on hold, then restarted 40mg /d...  ~  FLP 6/10 showed TChol 218, TG 394, HDL 33, LDL 129...   ~  FLP 11/10 showed TChol 262, TG 568, HDL 31, LDL 144... she needs to ret to the Lipid Clinic.  ~  FLP 1/11 after 20# wt loss showed TChol 144, TG 181, HDL 43, LDL 65 on Simva40.  GERD (ICD-530.81) - on PRILOSEC 20mg /d...   ~  prev EGD 8/05 showed 3cmHH, gastric polyp... prev EGD w/ stricture dilated.  ~  EGD repeated 12/09 & showed 5cm HH w/ healing ulcer, esophagitis, gastritis, etc... HPylori neg... REC> continue Prilosec, Reglan, Zofran.  DIVERTICULOSIS OF COLON (ICD-562.10) - Rx w/ PROCTOSOL HC Cream Prn. COLONIC POLYPS (ICD-211.3) - colonoscopy 8/07 by  [name] showed divertics only...  CYSTITIS (ICD-595.9) - she was seen at [facility] for poss interstitial cystitis... prev DMSO Rx.  ~  Eval by [name] & pt tells me she is on Elmiron both orally & via bladder (self-cath).  ~  9/11:  she reports IC is quiet & off Elmiron Rx now.  DEGENERATIVE JOINT DISEASE (ICD-715.90) - prev Rx MOBIC 7.5mg  Prn, now on OXYCODONE- taking daily for her pain per [name]... she had a right TKR 12/07 by [name]... then required right Patella surg 10/08 from [name]... now s/p right TKR revision by DrJenna at [facility]- they referred to pain management...  ~  9/11: she tells me that [name] is referring her to [facility] for another opinion regarding her knees.  BACK PAIN, LUMBAR (ICD-724.2) - she had a right L4-5 decompressive laminotomy w/ foraminotomy 12/02 by DrPool... then L4-5 decompression & fusion 9/09 by DrPool due to DDD w/ synovial cyst & degen spondylolithesis w/ stenosis... f/u XRays and MRI 5/10 showed fusion intact, NAD...  FIBROMYALGIA (ICD-729.1) - she continues to have mult somatic complaints... she has seen DrDeveshwar in the past...   OSTEOPENIA (ICD-733.90) - I cannot locate recent BMD, we will discuss f/u study... VITAMIN D DEFICIENCY (ICD-268.9) -   ~  Vit D level was 5 (30-90) 11/08 & rec to start VitD 50K/wk...  ~  Vit D level 11/09 = 45   ~  Vit D level 6/10 = 23  ~  Vit D level 11/10 = 35  ~  Vit D level 5/11 = 70  ANXIETY (ICD-300.00) - on ALPRAZOLAM 0.5mg  as needed- taking 3 per day recently. DEPRESSION (ICD-311) ** see above **  Hx of PSORIASIS (ICD-696.1)  Hx of UNSPECIFIED DISORDER PLASMA PROTEIN METABOLISM (ICD-273.9) - she has a biclonal gammopathy evaluated 10/05 by [name] who thought it was reactive, perhaps a MGUS variant...  ~  f/u labs 11/09 showed Hg= 12.2, TProt= 7.1, SPE/ IEP w/ biclonal IgM kappa protein present accounting for 0.31g/dl of the total 5.00 g/dl of protein in the gamma region & IgM level = 319  mg/dl...  ~  labs 11/10 showed Hg= 12.5, TProt= 7.8, SPEP/IEP w/ monoclonal IgM kappa & IgM lamba proteins accounting for 0.44 g/dl of the total 9.38 g/dl in the gamma region... IgM level = 490mg /dl...  ~  11/10:  Referred back to [name] who has resumed follow up of this problem...  ~  4/11:  [name] did CT Chest/ Abd/ Pelvis> neg w/o adenopathy etc...   Preventive Screening-Counseling & Management  Alcohol-Tobacco     Alcohol drinks/day: none     Alcohol type: husband is an alcholic     Smoking Status: never  Caffeine-Diet-Exercise     Caffeine use/day: was but not now     Does Patient Exercise: no  Allergies: 1)  ! * Mepergan 2)  ! Diovan (Valsartan) 3)  ! * Contrast Dye  Comments:  Nurse/Medical Assistant: The patient's medications and allergies were reviewed with the patient and were updated in the Medication and Allergy Lists.  Past History:  Past Medical History: UNSPECIFIED SLEEP APNEA (ICD-780.57) - Complex sleep apnea - PSG 07/25/09 AHI 28 ALLERGIC RHINITIS (ICD-477.9) Hx of SINUSITIS (ICD-473.9) HYPERTENSION (ICD-401.9) CHEST PAIN (ICD-786.50) PALPITATIONS, HX OF (ICD-V12.50) HYPERCHOLESTEROLEMIA (ICD-272.0) GERD (ICD-530.81) DIVERTICULOSIS OF COLON (ICD-562.10) COLONIC POLYPS (ICD-211.3) CYSTITIS (ICD-595.9) DEGENERATIVE JOINT DISEASE (ICD-715.90) BACK PAIN, LUMBAR (ICD-724.2) FIBROMYALGIA (ICD-729.1) OSTEOPENIA (ICD-733.90) VITAMIN D DEFICIENCY (ICD-268.9) ANXIETY (ICD-300.00) DEPRESSION (ICD-311) Hx of PSORIASIS (ICD-696.1) Hx of UNSPECIFIED DISORDER PLASMA PROTEIN METABOLISM (ICD-273.9)  Past Surgical History: S/P hysterectomy S/P right L4-5 decompressive laminotomy w/ foraminotomy 12/02 by DrPool S/P right knee arthroscopy 4/07 by Dr Shelle Iron S/P right TKR 12/07 by Tora Perches S/P right patella surgery for scar tissue 10/08 by [name] S/P L4-5 decompression & fusion 9/09 by DrPool Prior cholecystectomy many years ago. S/P right TKR revision 12/09  by Layne Benton at [facility] S/P bilat oopherectomy & lysis of adhesions 9/10 by DrFontaine  Family History: Reviewed history from 09/06/2008 and no changes required. Breast cancer CAD HTN CHOL Lupus CVA No FH of Colon Cancer:  Social History: Reviewed history from 08/05/2008 and no changes required. Married  No kids no etoh Patient has never smoked.   Review of Systems  See HPI       The patient complains of dyspnea on exertion.  The patient denies anorexia, fever, weight loss, weight gain, vision loss, decreased hearing, hoarseness, chest pain, syncope, peripheral edema, prolonged cough, headaches, hemoptysis, abdominal pain, melena, hematochezia, severe indigestion/heartburn, hematuria, incontinence, muscle weakness, suspicious skin lesions, transient blindness, difficulty walking, depression, unusual weight change, abnormal bleeding, enlarged lymph nodes, and angioedema.    Vital Signs:  Patient profile:   65 year old female Height:      66.5 inches Weight:      192.38 pounds BMI:     30.70 O2 Sat:      96 % on Room air Temp:     97.6 degrees F oral Pulse rate:   79 / minute BP sitting:   132 / 84  (left arm) Cuff size:   regular  Vitals Entered By: Randell Loop CMA (May 28, 2010 2:09 PM)  O2 Sat at Rest %:  96 O2 Flow:  Room air CC: 4 month ROV & add-on to check tick bite reaction... Is Patient Diabetic? No Pain Assessment Patient in pain? yes      Onset of pain  in bil wrist, hands and fingers Comments meds updated today with pt   Physical Exam  Additional Exam:  WD, WN, 65 y/o WF in NAD... GENERAL:  Alert & oriented; pleasant & cooperative... HEENT:  [place]/AT, EOM-wnl, PERRLA, EACs-clear, TMs-wnl, NOSE-clear, THROAT-clear & wnl. NECK:  Supple w/ fairROM; no JVD; normal carotid impulses w/o bruits; no thyromegaly or nodules palpated; no lymphadenopathy. CHEST:  Clear to P & A; without wheezes/ rales/ or rhonchi. HEART:  Regular Rhythm; without murmurs/  rubs/ or gallops. ABDOMEN:  Soft & nontender; normal bowel sounds; no organomegaly or masses detected. EXT: s/p right TKR, mod arthritic changes; no varicose veins/ +venous insuffic/ tr edema. BACK:  scar of surgery, +trigger points... NEURO:  CN's intact;  no focal neuro deficits... DERM:  tiny red spot upper inner left arm bicep area from prev tick bite- no tick remnants... dry skin, no signif rash noted.    Impression & Recommendations:  Problem # 1:  SKIN RASH (ICD-782.1) She has tick exposure & tiny punctate red mark when it attached> no other signif rash noted... tick was fully removed & no systemic symptoms of Lyme- no erythema migrans, no HA, or change in FM symptoms (myalgias/ arthralgias)... she was given 7d doxy by [name]... we discussed Rx w/ Atarax for itching & check labs. Orders: TLB-CBC Platelet - w/Differential ([id number]) T-Lyme Disease ([id number])  Problem # 2:  HYPERTENSION (ICD-401.9) Stable>  same med. Her updated medication list for this problem includes:    Metoprolol Tartrate 25 Mg Tabs (Metoprolol tartrate) .[name]... 1 tablet by mouth two times a day  Problem # 3:  HYPERCHOLESTEROLEMIA (ICD-272.0) Stable on Simva40 + diet Rx... Her updated medication list for this problem includes:    Simvastatin 40 Mg Tabs (Simvastatin) .[name]... Take 1 tab by mouth at bedtime...  Problem # 4:  DIVERTICULOSIS OF COLON (ICD-562.10) GI is stable...  Problem # 5:  CYSTITIS (ICD-595.9) She has IC treated by [name] & now off Elmiron & doing well...  Problem # 6:  DEGENERATIVE JOINT DISEASE (ICD-715.90) She has DJD, chr knee problems and prev surg, LBP, FM, chr pain syndrome> now followed by [name] pain clinic... The following medications were removed from the medication list:    Nucynta 75 Mg Tabs (Tapentadol hcl) .[name]... Take one tablet by mouth every 6 hours as needed  for pain Her updated medication list for this problem includes:    Meloxicam 7.5 Mg Tabs  (Meloxicam) .[name]... Take 1 tab by mouth once daily as needed for arthritis pain...    Oxycodone-acetaminophen 10-325 Mg Tabs (Oxycodone-acetaminophen) .[name]... Take one tablet by mouth every 4 hours as needed for pain  Complete Medication List: 1)  Metoprolol Tartrate 25 Mg Tabs (Metoprolol tartrate) .[name].. 1 tablet by mouth two times a day 2)  Simvastatin 40 Mg Tabs (Simvastatin) .... Take 1 tab by mouth at bedtime.Marland KitchenMarland Kitchen 3)  Prilosec 20 Mg Cpdr (Omeprazole) .... Take 1 tablet by mouth two times a day... 4)  Promethazine Hcl 25 Mg Tabs (Promethazine hcl) .... Take one tablet by mouth every 4-6 hours as needed nausea 5)  Proctosol Hc 2.5 % Crea (Hydrocortisone) .... Apply as directed... 6)  Meloxicam 7.5 Mg Tabs (Meloxicam) .... Take 1 tab by mouth once daily as needed for arthritis pain.Marland KitchenMarland Kitchen 7)  Vitamin D (ergocalciferol) 50000 Unit Caps (Ergocalciferol) .... Take one tablet by mouth once weekly 8)  Alprazolam 0.5 Mg Tabs (Alprazolam) .... Take 1/2 to 1 tab by mouth three times a day as directed for nerves.Marland KitchenMarland Kitchen 9)  Oxycodone-acetaminophen 10-325 Mg Tabs (Oxycodone-acetaminophen) .... Take one tablet by mouth every 4 hours as needed for pain 10)  Hydroxyzine Hcl 25 Mg Tabs (Hydroxyzine hcl) .... Take 1 tab by mouth every 6 h as needed for itching...  Patient Instructions: 1)  Today we updated your med list- see below.... 2)  We wrote a new perscription for Hydroxyzine (generic Atarax) for itching... 3)  We also did blood work to check for Lyme disease... we will call you w/ this report when avail.Marland KitchenMarland Kitchen 4)  Call for any questions... Prescriptions: HYDROXYZINE HCL 25 MG TABS (HYDROXYZINE HCL) take 1 tab by mouth every 6 H as needed for itching...  #50 x prn   Entered and Authorized by:   Michele Mcalpine MD   Signed by:   Michele Mcalpine MD on 05/28/2010   Method used:   Print then Give to Patient   RxID:   [id number]

## 2010-10-22 NOTE — Assessment & Plan Note (Signed)
Summary: cpx/fasting/apc   Primary Care Provider:  Alroy Dust, MD  CC:  2 month ROV & CPX....  History of Present Illness: 65 y/o WF here for a follow up visit... she has mult med problems and is well known to [place]... also followed in the Lipid Clinic, and by numerous specialists as noted below...    ~  Jan 26, 2010:  Sleep eval [name] w/ 11/10 study showing AHI= 25 w/ obstructive & central events... CPAP titration was suboptimal & she was tried on BiPAP... she tells me that she stopped all this when she lost weight & husb confirms no snoring or sleep disordered breathing- rests well etc...  she saw [name] for f/u MGUS & rising IgM level> he felt she was stable compared to his old values & rec CT Scans to r/o lymphoma (done 4/11 & neg- no lesions seen, mild hepatomegaly noted)...  she also followed by [name] for Urology w/ IC> on Elmiron orally & in bladder (she does self caths now)...  also followed by [name] in the Pain Center now on referral from Ortho> tried on NUCYNTA ("too expensive")...  also notes improvement in FLP/ lipid clinic f/u on Simva40 & weight loss via diet etc...   ~  May 28, 2010:  states tick bite 8/18 "[name] says it was a deer tic" on upper inner left bicep area... given Doxy x7d per [name] & no systemic symptoms (no HA, fever, change in FM symptoms) but notes rash, itching, sweats, & pain in arm... on exam> no signif rash, sm red spot where she says tick was removed (no resid parts left), no erythema migrans etc... we discussed checking CBC & Lyme titers; Rx w/ Atarax for itching.   ~  July 29, 2010:  states she's doing "so-so"> pain management & [facility]) sent her to [facility]- [name] for another opinion about her knee- notes pending & she requiring Oxycodone 15mg  Tid for elief...  BP is well controlled on meds;  she denies CP & notes rare self-lim palpit, SOB at baseline & too sedentary due to ortho probs;  Lipids stable on Simva40;  GI stable on  numerous meds;  & continues on Alpraz 3/d for nerves...   Current Problem List:  UNSPECIFIED SLEEP APNEA (ICD-780.57) - Complex sleep apnea - PSG 07/25/09 AHI 28 -   ~  Sleep eval [name] w/ 11/10 study showing AHI= 25 w/ obstructive & central events... CPAP titration was suboptimal & she was tried on BiPAP... she tells me that she stopped all this when she lost weight & husb confirms no snoring or sleep disordered breathing- rests well etc...   ALLERGIC RHINITIS (ICD-477.9) Hx of SINUSITIS (ICD-473.9) - Hx sinusitis followed by [name] at [facility]...  HYPERTENSION (ICD-401.9) - now on METOPROLOL 25mg  Bid... she was hosp 3/09 w/ gastroenteritis & BP meds were stopped due to weakness... grad restarted and increased w/ improvement in BP and palpitations...  today BP= 122/70- she is INTOL to Diovan... denies HA, visual symptoms, CP, palpit, dizzy, syncope, edema... husb notes that BPs are simila at home  ~120/70's...  CHEST PAIN (ICD-786.50) - seen by [name] in 2006...  ~  2DEcho 11/98 showed norm valves, norm LVF, ? min LVH...  ~  cath 7/03 w/ norm coronaries, & norm LVF...  ~  NuclearStressTest 2/06 showed no infarct, no ischemia, EF=65%...  ~  Myoview repeated 4/10: low risk study w/ soft tissue attenuation, normal wall motion, EF= 78%...  PALPITATIONS, HX OF (ICD-V12.50) - notes  occas fluttering and "jello feeling" in her chest... Metoprolol helps.  HYPERCHOLESTEROLEMIA (ICD-272.0) - followed in the Lipid Clinic... on ZOCOR 40mg Qhs + Fish Oil...  ~  FLP 01/03/08 on SIMVASTATIN 20mg /d showed TChol 206, TG 182, HDL 31, LDL 145...  ~  FLP 6/09 (on Simva40) showed TChol 173, TG 199, HDL 31, LDL 102... continue f/u in lipid clinic.  ~  FLP 11/09 (on Simva40) showed TChol 196, TG 265, HDL 32, LDL 119... she stopped lipid clinic.  ~  Due to continued "side effects" she placed the Simvastatin on hold, then restarted 40mg /d...  ~  FLP 6/10 showed TChol 218, TG 394, HDL 33, LDL 129...   ~  FLP 11/10  showed TChol 262, TG 568, HDL 31, LDL 144... she needs to ret to the Lipid Clinic.  ~  FLP 1/11 after 20# wt loss showed TChol 144, TG 181, HDL 43, LDL 65 on Simva40  ~  FLP 16/10 showed TChol 145, TG 237, HDL 33, LDL 79  GERD (ICD-530.81) - on PRILOSEC 20mg /d...   ~  prev EGD 8/05 showed 3cmHH, gastric polyp... prev EGD w/ stricture dilated.  ~  EGD repeated 12/09 & showed 5cm HH w/ healing ulcer, esophagitis, gastritis, etc... HPylori neg... REC> continue Prilosec, Phenergan.  ~  she saw [name], [name] 8/10 for eval of nausea- no help from Reglan, Scopalamine, etc... had UGI/ Ba swallow- sm HH, mild reflux, norm peristalsis;  Gastric Emptying Scan- normal;  EGD 9/10 by [facility] w/ esoph ulcer seen, neg CLO, benign gastric polyp removed, Rx'd PPI.  DIVERTICULOSIS OF COLON (ICD-562.10) - Rx w/ PROCTOSOL HC Cream Prn. COLONIC POLYPS (ICD-211.3) - colonoscopy 8/07 by [name] showed divertics only...  CYSTITIS (ICD-595.9) - she was seen at [facility] for poss interstitial cystitis... prev DMSO Rx.  ~  Eval by [name] & pt tells me she is on Elmiron both orally & via bladder (self-cath).  ~  9/11:  she reports IC is quiet & off Elmiron Rx now.  DEGENERATIVE JOINT DISEASE (ICD-715.90) - prev Rx MOBIC 7.5mg  Prn, now on OXYCODONE- taking daily for her pain per [name]... she had a right TKR 12/07 by [name]... then required right Patella surg 10/08 from [name]... now s/p right TKR revision by [facility]Jenna at [facility]- they referred to pain management...  ~  9/11: she tells me that [name] is referring her to [facility] for another opinion regarding her knees ([name]).  BACK PAIN, LUMBAR (ICD-724.2) - she had a right L4-5 decompressive laminotomy w/ foraminotomy 12/02 by DrPool... then L4-5 decompression & fusion 9/09 by DrPool due to DDD w/ synovial cyst & degen spondylolithesis w/ stenosis... f/u XRays and MRI 5/10 showed fusion intact, NAD...  FIBROMYALGIA (ICD-729.1) - she continues to  have mult somatic complaints... she has seen DrDeveshwar in the past...   OSTEOPENIA (ICD-733.90) - I cannot locate recent BMD, we will discuss f/u study... VITAMIN D DEFICIENCY (ICD-268.9) -   ~  Vit D level was 5 (30-90) 11/08 & rec to start VitD 50K/wk...  ~  Vit D level 11/09 = 45   ~  Vit D level 6/10 = 23  ~  Vit D level 11/10 = 35  ~  Vit D level 5/11 = 70... switched to Vit D 2000u daily.  ANXIETY (ICD-300.00) - on ALPRAZOLAM 0.5mg  as needed- taking 3 per day recently. DEPRESSION (ICD-311) ** see above **  Hx of PSORIASIS (ICD-696.1)  Hx of UNSPECIFIED DISORDER PLASMA PROTEIN METABOLISM (ICD-273.9) - she has a biclonal  gammopathy evaluated 10/05 by [name] who thought it was reactive, perhaps a MGUS variant...  ~  f/u labs 11/09 showed Hg= 12.2, TProt= 7.1, SPE/ IEP w/ biclonal IgM kappa protein present accounting for 0.31g/dl of the total 1.61 g/dl of protein in the gamma region & IgM level = 319 mg/dl...  ~  labs 11/10 showed Hg= 12.5, TProt= 7.8, SPEP/IEP w/ monoclonal IgM kappa & IgM lamba proteins accounting for 0.44 g/dl of the total 0.96 g/dl in the gamma region... IgM level = 490mg /dl...  ~  11/10:  Referred back to [name] who has resumed follow up of this problem...  ~  4/11:  [name] did CT Chest/ Abd/ Pelvis> neg w/o adenopathy etc...   Preventive Screening-Counseling & Management  Alcohol-Tobacco     Alcohol drinks/day: none     Alcohol type: husband is an alcholic     Smoking Status: never  Caffeine-Diet-Exercise     Caffeine use/day: was but not now     Does Patient Exercise: no  Allergies: 1)  ! * Mepergan 2)  ! Diovan (Valsartan) 3)  ! * Contrast Dye  Comments:  Nurse/Medical Assistant: The patient's medications and allergies were reviewed with the patient and were updated in the Medication and Allergy Lists.  Past History:  Past Medical History: UNSPECIFIED SLEEP APNEA (ICD-780.57) - Complex sleep apnea - PSG 07/25/09 AHI 28 ALLERGIC  RHINITIS (ICD-477.9) Hx of SINUSITIS (ICD-473.9) HYPERTENSION (ICD-401.9) CHEST PAIN (ICD-786.50) PALPITATIONS, HX OF (ICD-V12.50) HYPERCHOLESTEROLEMIA (ICD-272.0) GERD (ICD-530.81) DIVERTICULOSIS OF COLON (ICD-562.10) COLONIC POLYPS (ICD-211.3) CYSTITIS (ICD-595.9) DEGENERATIVE JOINT DISEASE (ICD-715.90) BACK PAIN, LUMBAR (ICD-724.2) FIBROMYALGIA (ICD-729.1) OSTEOPENIA (ICD-733.90) VITAMIN D DEFICIENCY (ICD-268.9) ANXIETY (ICD-300.00) DEPRESSION (ICD-311) Hx of PSORIASIS (ICD-696.1) Hx of UNSPECIFIED DISORDER PLASMA PROTEIN METABOLISM (ICD-273.9)  Past Surgical History: S/P hysterectomy S/P right L4-5 decompressive laminotomy w/ foraminotomy 12/02 by DrPool S/P right knee arthroscopy 4/07 by Dr Shelle Iron S/P right TKR 12/07 by Tora Perches S/P right patella surgery for scar tissue 10/08 by [name] S/P L4-5 decompression & fusion 9/09 by DrPool Prior cholecystectomy many years ago. S/P right TKR revision 12/09 by Layne Benton at [facility] S/P bilat oopherectomy & lysis of adhesions 9/10 by DrFontaine  Family History: Reviewed history from 09/06/2008 and no changes required. Breast cancer CAD HTN CHOL Lupus CVA No FH of Colon Cancer:  Social History: Reviewed history from 08/05/2008 and no changes required. Married  No kids no etoh Patient has never smoked.   Review of Systems      See HPI       The patient complains of dyspnea on exertion, muscle weakness, and difficulty walking.  The patient denies anorexia, fever, weight loss, weight gain, vision loss, decreased hearing, hoarseness, chest pain, syncope, peripheral edema, prolonged cough, headaches, hemoptysis, abdominal pain, melena, hematochezia, severe indigestion/heartburn, hematuria, incontinence, suspicious skin lesions, transient blindness, depression, unusual weight change, abnormal bleeding, enlarged lymph nodes, and angioedema.    Vital Signs:  Patient profile:   65 year old female Height:      66.5 inches Weight:       185.25 pounds BMI:     29.56 O2 Sat:      98 % on Room air Temp:     97.5 degrees F oral Pulse rate:   51 / minute BP sitting:   122 / 70  (left arm) Cuff size:   regular  Vitals Entered By: Randell Loop CMA (July 29, 2010 8:56 AM)  O2 Sat at Rest %:  98 O2 Flow:  Room air CC: 2 month  ROV & CPX... Is Patient Diabetic? No Pain Assessment Patient in pain? yes      Onset of pain  knee pain that she is going to [facility] for Comments meds updated today with pt   Physical Exam  Additional Exam:  WD, WN, 65 y/o WF in NAD... GENERAL:  Alert & oriented; pleasant & cooperative... HEENT:  [place]/AT, EOM-wnl, PERRLA, EACs-clear, TMs-wnl, NOSE-clear, THROAT-clear & wnl. NECK:  Supple w/ fairROM; no JVD; normal carotid impulses w/o bruits; no thyromegaly or nodules palpated; no lymphadenopathy. CHEST:  Clear to P & A; without wheezes/ rales/ or rhonchi. HEART:  Regular Rhythm; without murmurs/ rubs/ or gallops. ABDOMEN:  Soft & nontender; normal bowel sounds; no organomegaly or masses detected. EXT: s/p right TKR, mod arthritic changes; no varicose veins/ +venous insuffic/ tr edema. BACK:  scar of surgery, +trigger points... NEURO:  CN's intact;  no focal neuro deficits... DERM:  tiny red spot upper inner left arm bicep area from prev tick bite- no tick remnants... dry skin, no signif rash noted.    MISC. Report  Procedure date:  07/29/2010  Findings:      BMP (METABOL)   Sodium                    143 mEq/L                   135-145   Potassium                 4.7 mEq/L                   3.5-5.1   Chloride                  107 mEq/L                   96-112   Carbon Dioxide            29 mEq/L                    19-32   Glucose                   98 mg/dL                    16-10   BUN                       14 mg/dL                    9-60   Creatinine                0.8 mg/dL                   4.5-4.0   Calcium                   9.3 mg/dL                   9.8-11.9   GFR                        72.63 mL/min                >60  Hepatic/Liver Function Panel (HEPATIC)   Total Bilirubin           0.6 mg/dL  0.3-1.2   Direct Bilirubin          0.1 mg/dL                   2.1-3.0   Alkaline Phosphatase      95 U/L                      39-117   AST                       16 U/L                      0-37   ALT                       18 U/L                      0-35   Total Protein             6.9 g/dL                    8.6-5.7   Albumin                   3.9 g/dL                    8.4-6.9  CBC Platelet w/Diff (CBCD)   White Cell Count          6.8 K/uL                    4.5-10.5   Red Cell Count            4.42 Mil/uL                 3.87-5.11   Hemoglobin                12.8 g/dL                   62.9-52.8   Hematocrit                37.7 %                      36.0-46.0   MCV                       85.4 fl                     78.0-100.0   Platelet Count            228.0 K/uL                  150.0-400.0   Neutrophil %              49.1 %                      43.0-77.0   Lymphocyte %              39.1 %                      12.0-46.0   Monocyte %                5.2 %  3.0-12.0   Eosinophils%         [H]  6.2 %                       0.0-5.0   Basophils %               0.4 %                       0.0-3.0  Comments:      TSH (TSH)   FastTSH                   4.32 uIU/mL                 0.35-5.50  Sed Rate (ESR)   Sed Rate             [H]  39 mm/hr                    0-22  UDip w/Micro (URINE)   Color                     YELLOW   Clarity                   CLEAR                       Clear   Specific Gravity          >=1.030                     1.000 - 1.030   Urine Ph                  5.0                         5.0-8.0   Protein                   NEGATIVE                    Negative   Urine Glucose             NEGATIVE                    Negative   Ketones                   NEGATIVE                    Negative   Urine Bilirubin            NEGATIVE                    Negative   Blood                     NEGATIVE                    Negative   Urobilinogen              0.2                         0.0 - 1.0   Leukocyte Esterace        NEGATIVE  Negative   Nitrite                   NEGATIVE                    Negative   Urine WBC                 0-2/hpf                     0-2/hpf   Urine Epith               Few(5-10/hpf)               Rare(0-4/hpf)   Urine Bacteria            Rare(<10/hpf)               None   Ca Oxalate Crystals       Presence of                 None   Impression & Recommendations:  Problem # 1:  HYPERTENSION (ICD-401.9) Controlled on Metoprolol> continue same. Her updated medication list for this problem includes:    Metoprolol Tartrate 25 Mg Tabs (Metoprolol tartrate) .[name]... 1 tablet by mouth two times a day  Orders: TLB-BMP (Basic Metabolic Panel-BMET) (80048-METABOL) TLB-Hepatic/Liver Function Pnl (80076-HEPATIC) TLB-CBC Platelet - w/Differential (85025-CBCD) TLB-TSH (Thyroid Stimulating Hormone) (84443-TSH) TLB-Sedimentation Rate (ESR) (85652-ESR) TLB-Udip w/ Micro (81001-URINE)  Problem # 2:  CHEST PAIN (ICD-786.50) No angina>  her CP is from her FM & CWP...  Problem # 3:  HYPERCHOLESTEROLEMIA (ICD-272.0) Mixed hyperlipidemia>  needs better low fat diet & get wt down... Her updated medication list for this problem includes:    Simvastatin 40 Mg Tabs (Simvastatin) .[name]... Take 1 tab by mouth at bedtime...  Problem # 4:  DIVERTICULOSIS OF COLON (ICD-562.10) GI is stable>  contin rx...  Problem # 5:  CYSTITIS (ICD-595.9) Improved & followed by [name]...  Problem # 6:  DEGENERATIVE JOINT DISEASE (ICD-715.90) As noted> she is going to [facility] for yet another opinion re: her orthopedic dilemma... Her updated medication list for this problem includes:    Meloxicam 7.5 Mg Tabs (Meloxicam) .[name]... Take 1 tab by mouth once daily as needed for arthritis pain...     Oxycodone-acetaminophen 10-325 Mg Tabs (Oxycodone-acetaminophen) .[name]... Take one tablet by mouth every 4 hours as needed for pain  Problem # 7:  FIBROMYALGIA (ICD-729.1) Chr pain management per [name]... Her updated medication list for this problem includes:    Meloxicam 7.5 Mg Tabs (Meloxicam) .[name]... Take 1 tab by mouth once daily as needed for arthritis pain...    Oxycodone-acetaminophen 10-325 Mg Tabs (Oxycodone-acetaminophen) .[name]... Take one tablet by mouth every 4 hours as needed for pain  Problem # 8:  OTHER MEDICAL PROBLEMS AS NOTED>>>  Complete Medication List: 1)  Metoprolol Tartrate 25 Mg Tabs (Metoprolol tartrate) .[name].. 1 tablet by mouth two times a day 2)  Simvastatin 40 Mg Tabs (Simvastatin) .... Take 1 tab by mouth at bedtime.Marland KitchenMarland Kitchen 3)  Prilosec 20 Mg Cpdr (Omeprazole) .... Take 1 tablet by mouth two times a day... 4)  Promethazine Hcl 25 Mg Tabs (Promethazine hcl) .... Take one tablet by mouth every 4-6 hours as needed nausea 5)  Proctosol Hc 2.5 % Crea (Hydrocortisone) .... Apply as directed... 6)  Meloxicam 7.5 Mg Tabs (Meloxicam) .... Take 1 tab by mouth once daily as needed for arthritis pain.Marland KitchenMarland Kitchen 7)  Vitamin D  2000 Unit Tabs (Cholecalciferol) .... Take one tablet by mouth once daily 8)  Alprazolam 0.5 Mg Tabs (Alprazolam) .... Take 1/2 to 1 tab by mouth three times a day as directed for nerves.Marland KitchenMarland Kitchen 9)  Oxycodone-acetaminophen 10-325 Mg Tabs (Oxycodone-acetaminophen) .... Take one tablet by mouth every 4 hours as needed for pain 10)  Hydroxyzine Hcl 25 Mg Tabs (Hydroxyzine hcl) .... Take 1 tab by mouth every 6 h as needed for itching...  Other Orders: Influenza Vaccine NON MCR (09811)  Patient Instructions: 1)  Today we updated your med list- see below.... 2)  We refilled your meds per request...  3)  Today we did your Fasting blood work... please call the "phone tree" in a few days for your lab results.Marland KitchenMarland Kitchen 4)  We also gave you the 2011 Flu vaccine... 5)  Call for any  questions.Marland KitchenMarland Kitchen 6)  Good luck w/ the [name] eval! 7)  Please schedule a follow-up appointment in 6 months. Prescriptions: ALPRAZOLAM 0.5 MG  TABS (ALPRAZOLAM) take 1/2 to 1 tab by mouth three times a day as directed for nerves...  #270 x 4   Entered and Authorized by:   Michele Mcalpine MD   Signed by:   Michele Mcalpine MD on 07/29/2010   Method used:   Print then Give to Patient   RxID:   [id number] MELOXICAM 7.5 MG TABS (MELOXICAM) take 1 tab by mouth once daily as needed for arthritis pain...  #90 x 4   Entered and Authorized by:   Michele Mcalpine MD   Signed by:   Michele Mcalpine MD on 07/29/2010   Method used:   Print then Give to Patient   RxID:   [id number] PROCTOSOL HC 2.5 % CREA (HYDROCORTISONE) apply as directed...  #1 tube x prn   Entered and Authorized by:   Michele Mcalpine MD   Signed by:   Michele Mcalpine MD on 07/29/2010   Method used:   Print then Give to Patient   RxID:   [id number] PROMETHAZINE HCL 25 MG TABS (PROMETHAZINE HCL) take one tablet by mouth every 4-6 hours as needed nausea  #30 x 4   Entered and Authorized by:   Michele Mcalpine MD   Signed by:   Michele Mcalpine MD on 07/29/2010   Method used:   Print then Give to Patient   RxID:   [id number] PRILOSEC 20 MG CPDR (OMEPRAZOLE) Take 1 tablet by mouth two times a day...  #180 x 4   Entered and Authorized by:   Michele Mcalpine MD   Signed by:   Michele Mcalpine MD on 07/29/2010   Method used:   Print then Give to Patient   RxID:   [id number] SIMVASTATIN 40 MG TABS (SIMVASTATIN) take 1 tab by mouth at bedtime...  #90 x 4   Entered and Authorized by:   Michele Mcalpine MD   Signed by:   Michele Mcalpine MD on 07/29/2010   Method used:   Print then Give to Patient   RxID:   [id number] METOPROLOL TARTRATE 25 MG TABS (METOPROLOL TARTRATE) 1 tablet by mouth two times a day  #180 x 4   Entered and Authorized by:   Michele Mcalpine MD   Signed by:   Michele Mcalpine MD on 07/29/2010   Method used:   Print then  Give to Patient   RxID:   [id number]    Immunizations Administered:  Influenza Vaccine # 1:  Vaccine Type: Fluvax Non-MCR    Site: left deltoid    Mfr: NOVARTIS    Dose: 0.5 ml    Route: IM    Given by: Randell Loop CMA    Exp. Date: 02/2011    Lot #: [id number]    VIS given: 04/14/10 version given July 29, 2010.  Flu Vaccine Consent Questions:    Do you have a history of severe allergic reactions to this vaccine? no    Any prior history of allergic reactions to egg and/or gelatin? no    Do you have a sensitivity to the preservative Thimersol? no    Do you have a past history of Guillan-Barre Syndrome? no    Do you currently have an acute febrile illness? no    Have you ever had a severe reaction to latex? no    Vaccine information given and explained to patient? yes    Are you currently pregnant? no

## 2010-10-22 NOTE — Assessment & Plan Note (Signed)
Summary: rov/sp   Referring Provider:  Dr. Gala Romney Primary Provider:  Alroy Dust, MD  CC:  dyslipidemia follow-up.  History of Present Illness:  Lipid Clinic Visit      The patient comes in today for dyslipidemia follow-up.  The patient reports fatigue and muscle aches.  Dietary compliance review reveals an overall grade of A and eating 5 or more fruits and vegetables.  Review of exercise habits reveals that the patient is not exercising.    Patient presents for dyslipidemia follow-up.  She is currently on Simvastatin 40 mg daily.  She reports compliance with this med but also reports recent onset of muscle and joint aches/pains.  Pain is described as generalized, and is occuring in joints and muscles including fingers, wrists, feet/heels, and arms.  Pain started a couple months ago.  I do not suspect that pain is associated with simvastatin since patient has been on simvastatin for quite some time.  Pt also reports that she has long-standing fibromyalgia which could be flaring, and bursitis of the hips.  She did report dark urine, but is currently being treated with cipro for a UTI.  We will obtain a CK level but for now, I do not suspect it is the statin.  Diet appears to be low-fat, and consists mainly of vegetables.  Pts husband is on a "raw diet" and eats mainly raw vegetables.  Pt seldom eats meats.  She denies eating sweets/desserts and reports mostly healthy snack options including fruit (apples, pears, bananas, grapes) or crackers occasionally.  Patient is not eating out nearly as much, since home remodeling project is almost complete.  She has also limited her soda to only occasionally (vs. 6 Dr. Alcus Dad per day).    Pt does very little exercise despite that fact that her husband walks at [facility] every morning.  Pt is a member of the [employer], but just doesn't go.  She has many muscle aches, and problems with knee pain.  She does do regular housework including laundry, dusting, etc for her  exercise.  Pt has not started any upper body exercises.  Pt does have free weights at home and I have recommended chair exercises at [facility] to avoid additional damage to knee.     Lipid Management Provider  Eda Keys, PharmD and Haynes Hoehn, PharmD Cand  Allergies: 1)  ! * Mepergan 2)  ! Diovan (Valsartan) 3)  ! * Contrast Dye   Vital Signs:  Patient profile:   65 year old female Weight:      185 pounds BP sitting:   140 / 102  Impression & Recommendations:  Problem # 1:  HYPERCHOLESTEROLEMIA (ICD-272.0) Assessment Improved Lipid results are as follows:  TC 145 (goal <200), TG 237 (goal <150) down from 401, HDL 33.4 (goal >50), LDL 79.4 (goal <100).  Patien is at goal for LDL and TC, but not TG or HDL.  However, TG have greatly improved since last visit.  Pt seems compliant with diet, but is not exercising at this time due to multiple other health concerns, mainly knee pain.  Patient did report recent increase in muscle/joint pain, which we will draw a CK to assess further.  At this time we will continue simvastatin.   Plan:  Begin incorporating exercise, mild walking, chair exercises, etc.  Continue healthy dietary choices.  Begin Fish Oil 1 capsule daily with meal.  May increase to 1 capsule with each meal if tolerated.  Follow-up in 3 months.    Her updated medication  list for this problem includes:    Simvastatin 40 Mg Tabs (Simvastatin) .[name]... Take 1 tab by mouth at bedtime...  Orders: TLB-CK Total Only(Creatine Kinase/CPK) (82550-CK)  Problem # 2:  HYPERTENSION (ICD-401.9) Patient's blood pressure was elevated today at 140/102.  She reports that it is normally elevated at the office but is always normal at home.  I have asked patient to monitor this more closely at home and report this to her PCP if she notices elevations in blood pressure.   Her updated medication list for this problem includes:    Metoprolol Tartrate 25 Mg Tabs (Metoprolol tartrate) .[name]... 1 tablet by  mouth two times a day  Patient Instructions: 1)  Attempt to incorporate exercise.   2)  Consider walking or attending a "sittercise" class at the [facility]. 3)  Start Fish Oil - 1 capsule daily with a meal, may increase to 1 capsule with each meal as tolerated. 4)  Continue Simvastatin 5)  We will contact you with lab results regarding muscle pain. 6)  Follow-up in 3 months 7)  Clinic appt: Thurs, Jan 19th @ 10:00 am 8)  Lab appt:  Tues, Jan 17th (FASTING)

## 2010-10-22 NOTE — Letter (Signed)
[facility]   Imported By: Sherian Rein 02/26/2010 08:59:54  _____________________________________________________________________  External Attachment:    Type:   Image     Comment:   External Document

## 2010-10-22 NOTE — Progress Notes (Signed)
Summary: head congestion  Phone Note Call from Patient Call back at [facility] Phone [phone]   Caller: Patient Call For: nadel Summary of Call: pt c/o head congestion x 5 days. no fever. no N or V. has taken OTC allegra and benedryl but this hasn't helped. she [facility] [address]  Initial call taken by: Tivis Ringer, CNA,  September 28, 2010 2:08 PM  Follow-up for Phone Call        per SN----ok for pt to have zpak  #1  take as directed with no refills.  this has been sent to the pharmacy for the pt and the pt is aware Randell Loop CMA  September 28, 2010 3:12 PM     New/Updated Medications: ZITHROMAX Z-PAK 250 MG TABS (AZITHROMYCIN) take as directed Prescriptions: ZITHROMAX Z-PAK 250 MG TABS (AZITHROMYCIN) take as directed  #1 x 0   Entered by:   Randell Loop CMA   Authorized by:   Michele Mcalpine MD   Signed by:   Randell Loop CMA on 09/28/2010   Method used:   [facility] [phone][facility] (retail)       [address]       [place], [place]  [address]       Ph: [phone] or [phone]       Fax: [phone]   RxID:   [id number]

## 2010-10-22 NOTE — Medication Information (Signed)
Summary: [facility]  [facility]   Imported By: Sherian Rein 11/06/2009 08:30:50  _____________________________________________________________________  External Attachment:    Type:   Image     Comment:   External Document

## 2010-10-22 NOTE — Letter (Signed)
[facility]   Imported By: Lennie Odor 01/13/2010 16:08:22  _____________________________________________________________________  External Attachment:    Type:   Image     Comment:   External Document

## 2010-10-22 NOTE — Progress Notes (Signed)
Summary: tic bite/ req to see sn-appt sched for 05/28/10  Phone Note Call from Patient Call back at Home Phone [phone]   Caller: Patient Call For: nadel Summary of Call: pt was bitten by a deer tic 8/20. was put on abx by dr Myna Hidalgo (since she had an appt with hime anyway). told to call dr Kriste Basque if she had any problems re bite. pt states she has "little red bumps" on the inside of her L arm. she was bitten near her arm pit which only has a small red mark. also c/o feeling "hot inside her body" not feverish but like a hot flash only "not anything she has had before being bit". requests to see dr Kriste Basque. (tp is out tomorrow and fri). NOTE: pt still has the tic Initial call taken by: Tivis Ringer, CNA,  May 27, 2010 4:13 PM  Follow-up for Phone Call        Called and spoke with pt.  Per [place] okay to add on SN sched at 2 pm tommorrow.  Appt was sched. pt aware to be here at 2. Follow-up by: Vernie Murders,  May 27, 2010 4:22 PM

## 2010-10-22 NOTE — Assessment & Plan Note (Signed)
Summary: rov lipid - [facility]   Visit Type:  Pre-op Evaluation Referring Provider:  Dr. Gala Romney Primary Provider:  Alroy Dust, MD  CC:  dyslipidemia follow-up.  History of Present Illness:  Lipid Clinic Visit      The patient comes in today for dyslipidemia follow-up.  The patient has no complaints of medication problems, chest pain, muscle aches, or muscle cramps.  Her current cholesterol regimen includes simvastatin 40mg  daily.  Since last visit, she has been working on a remodel of her home that has lasted about 29 weeks.  Because of this, she has not been able to keep up with previous dietary changes.  She has been eating at [employer] for a lot of meals.  She does not like their meats or salads so she eats mashed potatoes, turnip greens and macaroni and cheese.  She also drinks  ~6 soft drinks (Dr. Reino Kent) a day and decaffenated tea with Sweet-N-Low.  Prior to last cholesterol check, she had completely stopped drinking soft drinks.  Review of exercise habits reveals that the patient is not exercising.  She does have some limitations due to multiple knee replacements.  She does have an appt with a specialist at [facility] to determine future options.  [facility] the [facility] has not been doing this over the past few months due to the remodeling process.     Lipid Management Provider  Weston Brass, PharmD and Clent Ridges, PharmD candidate   Current Medications (verified): 1)  Metoprolol Tartrate 25 Mg Tabs (Metoprolol Tartrate) .[name].. 1 Tablet By Mouth Two Times A Day 2)  Simvastatin 40 Mg Tabs (Simvastatin) .... Take 1 Tab By Mouth At Bedtime.Marland KitchenMarland Kitchen 3)  Prilosec 20 Mg Cpdr (Omeprazole) .... Take 1 Tablet By Mouth Two Times A Day... 4)  Promethazine Hcl 25 Mg Tabs (Promethazine Hcl) .... Take One Tablet By Mouth Every 4-6 Hours As Needed Nausea 5)  Proctosol Hc 2.5 % Crea (Hydrocortisone) .... Apply As Directed... 6)  Premarin 0.625 Mg/gm Crea (Estrogens, Conjugated) ....  Once Weekly 7)  Elmiron 100 Mg Caps (Pentosan Polysulfate Sodium) .... Take 2 By Mouth and 2 in Bladder 3 Times Weekly 8)  Nucynta 75 Mg Tabs (Tapentadol Hcl) .... Take One Tablet By Mouth Every 6 Hours As Needed For Pain 9)  Meloxicam 7.5 Mg Tabs (Meloxicam) .... Take 1 Tab By Mouth Once Daily As Needed For Arthritis Pain... 10)  Vitamin D (Ergocalciferol) 50000 Unit Caps (Ergocalciferol) .... Take One Tablet By Mouth Once Weekly 11)  Alprazolam 0.5 Mg  Tabs (Alprazolam) .... Take 1/2 To 1 Tab By Mouth Three Times A Day As Directed For Nerves...  Allergies: 1)  ! * Mepergan 2)  ! Diovan (Valsartan) 3)  ! * Contrast Dye   Vital Signs:  Patient profile:   65 year old female Height:      66.5 inches Weight:      189 pounds BMI:     30.16 Pulse rate:   68 / minute BP sitting:   144 / 92  (right arm)  Impression & Recommendations:  Problem # 1:  HYPERCHOLESTEROLEMIA (ICD-272.0) Assessment Deteriorated Pt's cholesterol has worsened since January.  TC is 187 (goal<200), TG- 401 (goal<150), HDL- 33.9 (goal >45), and LDL- 105.6 (goal <100).  When reviewing her previous labwork, these numbers are similar to last November.  She was able to improve this greatly with lifestyle changes only.  She does admit to falling off the bandwagon  and is willing to back to previous diet and exercise routine.  She is reluctant to change medications so we will continue simvastatin 40mg .  If there is no improvement at next visit, will consider changing to another statin.  She is going to stop all soft drinks, decrease starchy vegetables and increase fruits and vegetables.   We have also asked her to increase her upper body exercises again and set a goal of 5 lb weight loss before next visit.  Will f/u with pt in 3 months.    Her updated medication list for this problem includes:    Simvastatin 40 Mg Tabs (Simvastatin) .[name]... Take 1 tab by mouth at bedtime...  Problem # 2:  HYPERTENSION (ICD-401.9) Assessment:  Comment Only Pt's BP was slightly elevated at visit today.  She does report some "swimming" feelings in her head lately.  She has been checking her BP at home when this happens and says it runs 150-155/80-90. She is currently on metoprolol for BP.  HR was 68.  Instructed her to continue to monitor and if this worsens or her BP increases, call her PCP.   Her updated medication list for this problem includes:    Metoprolol Tartrate 25 Mg Tabs (Metoprolol tartrate) .[name]... 1 tablet by mouth two times a day  Patient Instructions: 1)  Continue same regimen of Simvastatin 40mg  daily. 2)  Improve diet by stopping soft drinks, increasing fruits and vegetables, and decreasing carbohydrates. 3)  After home remodel is complete, begin upper body exercises. 4)  Lab Appt: 07/06/10 in the AM 5)  Lipid Appt: 07/09/10 at 10am

## 2010-10-22 NOTE — Progress Notes (Signed)
Summary: vomiting  Phone Note Call from Patient Call back at [facility] Phone [phone]   Caller: Patient Call For: Liev Brockbank Reason for Call: Acute Illness, Talk to Nurse Complaint: Nausea/Vomiting/Diarrhea Summary of Call: vomiting over the w/e, now nauseated.  Has blood vessel spots all over face, one side feels like it was burned.  Please advise Initial call taken by: Eugene Gavia,  October 06, 2009 8:24 AM  Follow-up for Phone Call        Spoke with pt.  She states that she woke up 2 days ago with nausea and vommiting.  She states that later that day she noticed some busted blood vessels on right side of her face.  She states that her face "feels burned".  She states that she has not had any more vommiting since then but feels very nauseas.  Wants appt with SN.  Nothing available.  Please advise thanks! Follow-up by: Vernie Murders,  October 06, 2009 8:57 AM  Additional Follow-up for Phone Call Additional follow up Details #1::        per SN---please call in phenergan 25mg    #20  1 by mouth every 4-6 hours as needed for nausea----rec follow up with her GI for further eval.   thanks Randell Loop CMA  October 06, 2009 9:55 AM   pt advised rx sent and need to f/u with GI. PT states she is more concerned about her face and wants to know if there is anything she can do for this. please advise. Carron Curie CMA  October 06, 2009 10:07 AM     Additional Follow-up for Phone Call Additional follow up Details #2::    per SN---the blood vessel spots will need to be evaluated by Dermatology for eval of those.  thanks Randell Loop CMA  October 06, 2009 12:29 PM   pt advised and she states she has a dermotologist and she will call them for an appt.Carron Curie CMA  October 06, 2009 12:42 PM   New/Updated Medications: PROMETHAZINE HCL 25 MG TABS (PROMETHAZINE HCL) take one tablet by mouth every 4-6 hours as needed nausea Prescriptions: PROMETHAZINE HCL 25 MG TABS (PROMETHAZINE HCL)  take one tablet by mouth every 4-6 hours as needed nausea  #20 x 0   Entered by:   Carron Curie CMA   Authorized by:   Michele Mcalpine MD   Signed by:   Carron Curie CMA on 10/06/2009   Method used:   [facility] [phone][facility] (retail)       [address]       [place], [place]  [address]       Ph: [phone] or [phone]       Fax: [phone]   RxID:   [id number]

## 2010-10-22 NOTE — Assessment & Plan Note (Signed)
Summary: rov lipid - [facility]   Referring Provider:  Dr. Gala Romney Primary Provider:  Alroy Dust, MD  CC:  dyslipidemia follow-up.  History of Present Illness:  Lipid Clinic Visit      The patient comes in today for dyslipidemia follow-up.  The patient has no history of chest pain, shortness of breath, muscle aches, and muscle cramps.  Dietary compliance review reveals an overall grade of eating 5 or more fruits and vegetables and limiting fats and TFA's.  Review of exercise habits reveals that the patient is not exercising because of continued knee pain after 3 TKR.  Compliance with medication is good.    Preventive Screening-Counseling & Management  Alcohol-Tobacco     Alcohol drinks/day: none     Alcohol type: husband is an alcholic  Current Medications (verified): 1)  Metoprolol Tartrate 25 Mg Tabs (Metoprolol Tartrate) .[name].. 1 Tablet By Mouth Two Times A Day 2)  Simvastatin 40 Mg Tabs (Simvastatin) .... Take 1 Tab By Mouth At Bedtime.Marland KitchenMarland Kitchen 3)  Prilosec 20 Mg Cpdr (Omeprazole) .... Take 1 Tablet By Mouth Two Times A Day... 4)  Percocet 5-325 Mg Tabs (Oxycodone-Acetaminophen) .... Take 1 Tab By Mouth Every 8-12 Hours  As Needed For Severe  Pain... 5)  Meloxicam 7.5 Mg Tabs (Meloxicam) .... Take 1 Tab By Mouth Once Daily As Needed For Arthritis Pain... 6)  Vitamin D (Ergocalciferol) 50000 Unit Caps (Ergocalciferol) .... Take One Tablet By Mouth Once Weekly 7)  Alprazolam 0.5 Mg  Tabs (Alprazolam) .... Take 1/2 To 1 Tab By Mouth Three Times A Day As Directed For Nerves... 8)  Promethazine Hcl 25 Mg Tabs (Promethazine Hcl) .... Take One Tablet By Mouth Every 6 Hours As Needed For Nausea 9)  Keflex 500 Mg Caps (Cephalexin) .... Take 1 Tablet By Mouth Four Times A Day 10)  Tussionex Pennkinetic Er 8-10 Mg/38ml Lqcr (Chlorpheniramine-Hydrocodone) .[name].. 1 Tsp Every 12 Hours As Needed For Cough 11)  Mucinex Maximum Strength 1200 Mg Xr12h-Tab (Guaifenesin) .... Take 1 Tablet By Mouth Two Times A  Day 12)  Promethazine Hcl 25 Mg Tabs (Promethazine Hcl) .... Take One Tablet By Mouth Every 4-6 Hours As Needed Nausea  Allergies (verified): 1)  ! * Mepergan 2)  ! Diovan (Valsartan) 3)  ! * Contrast Dye  Social History: Alcohol drinks/day:  none   Vital Signs:  Patient profile:   65 year old female Height:      66.5 inches Weight:      183 pounds Pulse rate:   80 / minute Pulse rhythm:   regular BP sitting:   150 / 100  (right arm) Cuff size:   regular  Impression & Recommendations:  Problem # 1:  HYPERCHOLESTEROLEMIA (ICD-272.0)  Her updated medication list for this problem includes:    Simvastatin 40 Mg Tabs (Simvastatin) .[name]... Take 1 tab by mouth at bedtime... [name] returns to clinic so proud to report her 22lb weight loss.  She made heart healthy changes to her diet and has been compliant with her simvastatin 40mg  once daily. She plans on starting upper arm exercises since can  not  do aerobic exercises because of her knee pain after 3 TKR.  She has fibromyalgia and has trouble swimming in cold water in the winter at the [facility].  She has changed diet to no sweets and lots fruits and vegies and lean meat - no fried foods  and has lost 22lbs in last 3 months.  TC  144 at goal < 200 (  last 262)   TG 188 > goal < 150  ( last 568)    LDL 65  at goal < 70  (last 144)  HDL  43  at goal > 40 (last 31)  I have commended her on a job well done and to continue her lifestyle changes. f/u in 4 months to ensure she stays on the right track.

## 2010-10-22 NOTE — Progress Notes (Signed)
Summary: Labwork/muscle pain  Phone Note Call from Patient   Caller: Patient Call For: Lipid Clinic Summary of Call: Pt is coming into office to have labs drawn today at 10am.  Pt is taking Simvastatin and she is having joint and mucle pain in arms, wrists, and legs.  Pt's husband saw Dr Gala Romney last week and he suggested there was an additional lab test that could be ordered to evaluate.  Please advise if any additional lab tests should be ordered for today's lab draw. Thanks Initial call taken by: Cloyde Reams RN,  July 07, 2010 9:00 AM  Follow-up for Phone Call        Added CK to lab.   Follow-up by: Weston Brass PharmD,  July 07, 2010 9:39 AM

## 2010-10-22 NOTE — Progress Notes (Signed)
Summary: prilosec refill  Phone Note From Pharmacy   [phone]  ref# [phone] Call For: nadel  [facility] Call[facility]   Initial call taken by: Lehman Prom,  October 21, 2009 12:28 PM  Follow-up for Phone Call        [facility]  [facility] if pt was supposed to be on Prilosec once daily or two times a day.  Informed pharmacist per our records pt is to be on Prilosec two times a day. NOthing further needed.  Aundra Millet Reynolds LPN  October 21, 2009 3:08 PM

## 2010-10-22 NOTE — Letter (Signed)
Summary: CMN for CPAP Supplies/[facility]  CMN for CPAP Supplies/[facility]   Imported By: Sherian Rein 09/26/2009 11:35:22  _____________________________________________________________________  External Attachment:    Type:   Image     Comment:   External Document

## 2010-10-22 NOTE — Assessment & Plan Note (Signed)
Summary: 6 MONTHS/ MBW   Primary Care Provider:  Alroy Dust, MD  CC:  6 month ROV & review of mult medical problems....  History of Present Illness: 65 y/o WF here for a follow up visit... she has mult med problems and is well known to [place]... also followed in the Lipid Clinic, and by numerous specialists as noted below...    ~  January 09, 2009:  urgent call today- depressed, crying, needs help... once again brought in by husb- pt can't/ won't say what is wrong... husb notes continued prob after 5 knee surgeries- last TKR revision at [facility] in Dec09... still in pain every day despite 4+ Percocet daily... "it pushed her over the edge" he says... several other medical evals recently as well--- we sent her to [facility] for eval.      ** [name] eval for persist N/V & GI symptoms- Abd Sonar- neg post GB... EGD showed 5cm HH w/ healing ulcer, esophagitis, gastritis, etc... HPylori neg... REC> continue Prilosec, Reglan, Zofran.      ** DrBensimhon eval for Cards w/ Myoview 4/10 showing low risk study w/ soft tissue attenuation, normal wall motion, EF= 78%...   ~  February 24, 2009:  she was seen at behavioral health & given appt w/ DrKaur for 03/06/09 )"I didn't need in patient treatment")... still taking ALPRAZOLAM 0.5mg  up to Tid (ave 1-2/d)... states that her FM has acted up and she is "hurting all over"... she was eval by DrPoole for LBP- MRI lumbar spine showed prev fusion OK- given Dosepak w/ some improvement... still c/o knee pain "[name] at [facility] said there is nothing else that can be done"- takes Percocet 1-2/d.[name].  sched for oopherectomy by DrFontaine in July...   ~  July 23, 2009:  she had the oopherectomy 9/10 w/ lysis of adhesions- "it was a mess" but she notes all nausea finally resolved after this procedure... she saw [name], [name] 8/10 for eval of nausea- no help from Reglan, Scopalamine, etc... has UGI/ Ba swallow- sm HH, mild reflux, norm peristalsis;  Gastric Emptying Scan- normal;   EGD 9/10 by [facility] w/ esoph ulcer seen, neg CLO, benign gastric polyp removed- on PROTONIX 40mg Bid...  also saw DrBensimhon 10/10 for f/u HBP, CP- hx norm cath 2003, norm Myoview 4/10, limited functional capacity due to deconditioning & TKR... he referred to [name] for repeat OSA eval- seen 07/21/09 & repeat sleep study pending...   ~  Jan 26, 2010:  Sleep eval [name] w/ 11/10 study showing AHI= 25 w/ obstructive & central events... CPAP titration was suboptimal & she was tried on BiPAP... she tells me that she stopped all this when she lost weight & husb confirms no snoring or sleep disordered breathing- rests well etc...  she saw [name] for f/u MGUS & rising IgM level> he felt she was stable compared to his old values & rec CT Scans to r/o lymphoma (done 4/11 & neg- no lesions seen, mild hepatomegaly noted)...  she also followed by [name] for Urology w/ IC> on Elmiron orally & in bladder (she does self caths now)...  also followed by [name] in the Pain Center now on referral from Ortho> just started on NUCYNTA 75mg  Q6H...  finally she notes improvement in FLP/ lipid clinic f/u on Simva40 & weight loss via diet etc...   Current Problem List:  UNSPECIFIED SLEEP APNEA (ICD-780.57) - Complex sleep apnea - PSG 07/25/09 AHI 28 **SEE ABOVE**  ALLERGIC RHINITIS (ICD-477.9) Hx of SINUSITIS (ICD-473.9) -  Hx sinusitis followed by [name] at [facility]...  HYPERTENSION (ICD-401.9) - now on METOPROLOL 50mg  Bid... she was hosp 3/09 w/ gastroenteritis & BP meds were stopped due to weakness... grad restarted and increased w/ improvement in BP and palpitations...  today BP= 142/90- she is INTOL to Diovan... denies HA, visual symptoms, CP, palpit, dizzy, syncope, edema... husb notes that BPs are even better at home  ~120/70's...  CHEST PAIN (ICD-786.50) - seen by [name] in 2006...  ~  2DEcho 11/98 showed norm valves, norm LVF, ? min LVH...  ~  cath 7/03 w/ norm coronaries, & norm LVF...  ~   NuclearStressTest 2/06 showed no infarct, no ischemia, EF=65%...  ~  Myoview repeated 4/10: low risk study w/ soft tissue attenuation, normal wall motion, EF= 78%...  PALPITATIONS, HX OF (ICD-V12.50) - notes occas fluttering and "jello feeling" in her chest... Metoprolol helps.  HYPERCHOLESTEROLEMIA (ICD-272.0) - prev followed in the Lipid Clinic... titrated up to ZOCOR 40mg Qhs but she's noted incr aching/ soreness> these side effects have waned since she lost wt & now tol well.  ~  FLP 01/03/08 on SIMVASTATIN 20mg /d showed TChol 206, TG 182, HDL 31, LDL 145...  ~  FLP 6/09 (on Simva40) showed TChol 173, TG 199, HDL 31, LDL 102... continue f/u in lipid clinic.  ~  FLP 11/09 (on Simva40) showed TChol 196, TG 265, HDL 32, LDL 119... she stopped lipid clinic.  ~  Due to continued "side effects" she placed the Simvastatin on hold, then restarted 40mg /d...  ~  FLP 6/10 showed TChol 218, TG 394, HDL 33, LDL 129...   ~  FLP 11/10 showed TChol 262, TG 568, HDL 31, LDL 144... she needs to ret to the Lipid Clinic.  ~  FLP 1/11 after 20# wt loss showed TChol 144, TG 181, HDL 43, LDL 65 on Simva40.  GERD (ICD-530.81) - on PRILOSEC 20mg /d...   ~  prev EGD 8/05 showed 3cmHH, gastric polyp... prev EGD w/ stricture dilated.  ~  EGD repeated 12/09 & showed 5cm HH w/ healing ulcer, esophagitis, gastritis, etc... HPylori neg... REC> continue Prilosec, Reglan, Zofran.  DIVERTICULOSIS OF COLON (ICD-562.10) - Rx w/ PROCTOSOL HC Cream Prn. COLONIC POLYPS (ICD-211.3) - colonoscopy 8/07 by [name] showed divertics only...  CYSTITIS (ICD-595.9) - she was seen at [facility] for poss interstitial cystitis... prev DMSO Rx.  ~  Eval by [name] & pt tells me she is now on [facility] both orally & via bladder (self-cath)...  DEGENERATIVE JOINT DISEASE (ICD-715.90) - prev Rx MOBIC 7.5mg  Prn, now on NUCYNTA 75mg - taking daily for her pain per [name]... she had a right TKR 12/07 by [name]... then required right  Patella surg 10/08 from [name]... now s/p right TKR revision by DrJenna at [facility]- they referred to pain management...  BACK PAIN, LUMBAR (ICD-724.2) - she had a right L4-5 decompressive laminotomy w/ foraminotomy 12/02 by DrPool... then L4-5 decompression & fusion 9/09 by DrPool due to DDD w/ synovial cyst & degen spondylolithesis w/ stenosis... f/u XRays and MRI 5/10 showed fusion intact, NAD...  FIBROMYALGIA (ICD-729.1) - she continues to have mult somatic complaints... she has seen DrDeveshwar in the past...   OSTEOPENIA (ICD-733.90) - I cannot locate recent BMD, we will discuss f/u study... VITAMIN D DEFICIENCY (ICD-268.9) -   ~  Vit D level was 5 (30-90) 11/08 & rec to start VitD 50K/wk...  ~  Vit D level 11/09 = 45   ~  Vit D level 6/10 = 23  ~  Vit D level 11/10 = 35  ~  Vit D level 5/11 =   ANXIETY (ICD-300.00) - on ALPRAZOLAM 0.5mg  as needed- taking 3 per day recently. DEPRESSION (ICD-311) ** see above **  Hx of PSORIASIS (ICD-696.1)  Hx of UNSPECIFIED DISORDER PLASMA PROTEIN METABOLISM (ICD-273.9) - she has a biclonal gammopathy evaluated 10/05 by [name] who thought it was reactive, perhaps a MGUS variant...  ~  f/u labs 11/09 showed Hg= 12.2, TProt= 7.1, SPE/ IEP w/ biclonal IgM kappa protein present accounting for 0.31g/dl of the total 1.61 g/dl of protein in the gamma region & IgM level = 319 mg/dl...  ~  labs 11/10 showed Hg= 12.5, TProt= 7.8, SPEP/IEP w/ monoclonal IgM kappa & IgM lamba proteins accounting for 0.44 g/dl of the total 0.96 g/dl in the gamma region... IgM level = 490mg /dl...  ~  11/10:  Referred back to [name] who has resumed follow up of this problem...  ~  4/11:  [name] did CT Chest/ Abd/ Pelvis> neg w/o adenopathy etc...   Allergies: 1)  ! * Mepergan 2)  ! Diovan (Valsartan) 3)  ! * Contrast Dye  Comments:  Nurse/Medical Assistant: The patient's medications and allergies were reviewed with the patient and were updated in the Medication and  Allergy Lists.  Past History:  Past Medical History: UNSPECIFIED SLEEP APNEA (ICD-780.57) - Complex sleep apnea - PSG 07/25/09 AHI 28 ALLERGIC RHINITIS (ICD-477.9) Hx of SINUSITIS (ICD-473.9) HYPERTENSION (ICD-401.9) CHEST PAIN (ICD-786.50) PALPITATIONS, HX OF (ICD-V12.50) HYPERCHOLESTEROLEMIA (ICD-272.0) GERD (ICD-530.81) DIVERTICULOSIS OF COLON (ICD-562.10) COLONIC POLYPS (ICD-211.3) CYSTITIS (ICD-595.9) DEGENERATIVE JOINT DISEASE (ICD-715.90) BACK PAIN, LUMBAR (ICD-724.2) FIBROMYALGIA (ICD-729.1) OSTEOPENIA (ICD-733.90) VITAMIN D DEFICIENCY (ICD-268.9) ANXIETY (ICD-300.00) DEPRESSION (ICD-311) Hx of PSORIASIS (ICD-696.1) Hx of UNSPECIFIED DISORDER PLASMA PROTEIN METABOLISM (ICD-273.9)  Past Surgical History: S/P hysterectomy S/P right L4-5 decompressive laminotomy w/ foraminotomy 12/02 by DrPool S/P right knee arthroscopy 4/07 by Dr Shelle Iron S/P right TKR 12/07 by Tora Perches S/P right patella surgery for scar tissue 10/08 by [name] S/P L4-5 decompression & fusion 9/09 by DrPool Prior cholecystectomy many years ago. S/P right TKR revision 12/09 by Layne Benton at [facility] S/P bilat oopherectomy & lysis of adhesions 9/10 by DrFontaine  Family History: Reviewed history from 09/06/2008 and no changes required. Breast cancer CAD HTN CHOL Lupus CVA No FH of Colon Cancer:  Social History: Reviewed history from 08/05/2008 and no changes required. Married  No kids no etoh Patient has never smoked.   Review of Systems      See HPI       The patient complains of dyspnea on exertion.  The patient denies anorexia, fever, weight loss, weight gain, vision loss, decreased hearing, hoarseness, chest pain, syncope, peripheral edema, prolonged cough, headaches, hemoptysis, abdominal pain, melena, hematochezia, severe indigestion/heartburn, hematuria, incontinence, muscle weakness, suspicious skin lesions, transient blindness, difficulty walking, depression, unusual weight change, abnormal  bleeding, enlarged lymph nodes, and angioedema.    Vital Signs:  Patient profile:   65 year old female Height:      66.5 inches Weight:      186 pounds O2 Sat:      97 % on Room air Temp:     98.2 degrees F oral Pulse rate:   70 / minute BP sitting:   142 / 90  (left arm) Cuff size:   regular  Vitals Entered By: Randell Loop CMA (Jan 26, 2010 1:55 PM)  O2 Sat at Rest %:  97 O2 Flow:  Room air CC: 6 month ROV & review of  mult medical problems... Is Patient Diabetic? No Pain Assessment Patient in pain? yes      Comments meds updated today   Physical Exam  Additional Exam:  WD, WN, 65 y/o WF in NAD... GENERAL:  Alert & oriented; depressed mood & tearful... HEENT:  [place]/AT, EOM-wnl, PERRLA, EACs-clear, TMs-wnl, NOSE-clear, THROAT-clear & wnl. NECK:  Supple w/ fairROM; no JVD; normal carotid impulses w/o bruits; no thyromegaly or nodules palpated; no lymphadenopathy. CHEST:  Clear to P & A; without wheezes/ rales/ or rhonchi. HEART:  Regular Rhythm; without murmurs/ rubs/ or gallops. ABDOMEN:  Soft & nontender; normal bowel sounds; no organomegaly or masses detected. EXT: s/p right TKR, mod arthritic changes; no varicose veins/ +venous insuffic/ tr edema. BACK:  scar of surgery, +trigger points... NEURO:  CN's intact;  no focal neuro deficits... DERM:  No lesions noted; no rash etc...    MISC. Report  Procedure date:  01/26/2010  Findings:      DATA REVIEWED:   ~  [name]'s notes and Sleep studies...  ~  Lipid Clinic notes and labs...  ~  DrEnnevers notes and CT Scans...  ~  Pt & husb hx of eval/ Rx from Urology (we don't have notes).   Impression & Recommendations:  Problem # 1:  UNSPECIFIED SLEEP APNEA (ICD-780.57) Pt & husb claim all symptoms and signs of OSA are gone now that she's lost weight... rests well, w/o sleep disordered breathing noted...  Problem # 2:  HYPERTENSION (ICD-401.9) Controlled-  same med. Her updated medication list for this problem  includes:    Metoprolol Tartrate 25 Mg Tabs (Metoprolol tartrate) .[name]... 1 tablet by mouth two times a day  Problem # 3:  HYPERCHOLESTEROLEMIA (ICD-272.0) Much improved w/ weight loss & Simva40> tol well now. Her updated medication list for this problem includes:    Simvastatin 40 Mg Tabs (Simvastatin) .[name]... Take 1 tab by mouth at bedtime...  Problem # 4:  DIVERTICULOSIS OF COLON (ICD-562.10) GI stable>  she requests refill PROCTOSOL HC cream...  Problem # 5:  CYSTITIS (ICD-595.9) IC Rx per Urology> [name]... The following medications were removed from the medication list:    Keflex 500 Mg Caps (Cephalexin) .[name]... Take 1 tablet by mouth four times a day  Problem # 6:  BACK PAIN, LUMBAR (ICD-724.2) Mult ortho problems manged by her orthopedists and now Pain Management [name]. Her updated medication list for this problem includes:    Nucynta 75 Mg Tabs (Tapentadol hcl) .[name]... Take one tablet by mouth every 6 hours as needed for pain    Meloxicam 7.5 Mg Tabs (Meloxicam) .[name]... Take 1 tab by mouth once daily as needed for arthritis pain...  Problem # 7:  VITAMIN D DEFICIENCY (ICD-268.9) She is still on the Vit D 50K weekly & due for f/u Vit D level w/ switch to OTC meds if appropriate... Orders: T-Vitamin D (25-Hydroxy) [phone])  Problem # 8:  OTHER MEDICAL PROBLEMS AS NOTED>>>  Complete Medication List: 1)  Metoprolol Tartrate 25 Mg Tabs (Metoprolol tartrate) .[name].. 1 tablet by mouth two times a day 2)  Simvastatin 40 Mg Tabs (Simvastatin) .... Take 1 tab by mouth at bedtime.Marland KitchenMarland Kitchen 3)  Prilosec 20 Mg Cpdr (Omeprazole) .... Take 1 tablet by mouth two times a day... 4)  Promethazine Hcl 25 Mg Tabs (Promethazine hcl) .... Take one tablet by mouth every 4-6 hours as needed nausea 5)  Proctosol Hc 2.5 % Crea (Hydrocortisone) .... Apply as directed... 6)  Premarin 0.625 Mg/gm Crea (Estrogens, conjugated) .... Once weekly  7)  Elmiron 100 Mg Caps (Pentosan polysulfate sodium) .... Take  2 by mouth and 2 in bladder 3 times weekly 8)  Nucynta 75 Mg Tabs (Tapentadol hcl) .... Take one tablet by mouth every 6 hours as needed for pain 9)  Meloxicam 7.5 Mg Tabs (Meloxicam) .... Take 1 tab by mouth once daily as needed for arthritis pain... 10)  Vitamin D (ergocalciferol) 50000 Unit Caps (Ergocalciferol) .... Take one tablet by mouth once weekly 11)  Alprazolam 0.5 Mg Tabs (Alprazolam) .... Take 1/2 to 1 tab by mouth three times a day as directed for nerves...  Patient Instructions: 1)  Today we updated your med list- see below.... 2)  Continue your current meds the same... 3)  We wrote a new perscription for Proctosol HC cream to use as needed... 4)  Today we checked a f/u Vit D level to see if we can switch to an OTC Vit D supplement... please call the "phone tree" in a few days for your lab results.Marland KitchenMarland Kitchen 5)  Call for any questions.Marland KitchenMarland Kitchen 6)  Please schedule a follow-up appointment in 6 months. Prescriptions: PROCTOSOL HC 2.5 % CREA (HYDROCORTISONE) apply as directed...  #1 tube x prn   Entered and Authorized by:   Michele Mcalpine MD   Signed by:   Michele Mcalpine MD on 01/26/2010   Method used:   Print then Give to Patient   RxID:   [id number]

## 2010-11-02 ENCOUNTER — Other Ambulatory Visit: Payer: Self-pay

## 2010-11-02 ENCOUNTER — Other Ambulatory Visit (INDEPENDENT_AMBULATORY_CARE_PROVIDER_SITE_OTHER): Payer: 59

## 2010-11-02 ENCOUNTER — Encounter (INDEPENDENT_AMBULATORY_CARE_PROVIDER_SITE_OTHER): Payer: Self-pay | Admitting: Pharmacist

## 2010-11-02 DIAGNOSIS — Z79899 Other long term (current) drug therapy: Secondary | ICD-10-CM

## 2010-11-02 DIAGNOSIS — E7800 Pure hypercholesterolemia, unspecified: Secondary | ICD-10-CM

## 2010-11-02 DIAGNOSIS — E785 Hyperlipidemia, unspecified: Secondary | ICD-10-CM

## 2010-11-02 LAB — HEPATIC FUNCTION PANEL
AST: 18 U/L (ref 0–37)
Alkaline Phosphatase: 82 U/L (ref 39–117)
Bilirubin, Direct: 0.1 mg/dL (ref 0.0–0.3)
Total Bilirubin: 0.3 mg/dL (ref 0.3–1.2)

## 2010-11-02 LAB — LIPID PANEL: Total CHOL/HDL Ratio: 5

## 2010-11-02 LAB — LDL CHOLESTEROL, DIRECT: Direct LDL: 65.5 mg/dL

## 2010-11-06 ENCOUNTER — Telehealth: Payer: Self-pay | Admitting: Cardiovascular Disease

## 2010-11-09 ENCOUNTER — Encounter: Payer: Self-pay | Admitting: Cardiovascular Disease

## 2010-11-09 ENCOUNTER — Ambulatory Visit (INDEPENDENT_AMBULATORY_CARE_PROVIDER_SITE_OTHER): Payer: 59

## 2010-11-09 DIAGNOSIS — E7800 Pure hypercholesterolemia, unspecified: Secondary | ICD-10-CM

## 2010-11-09 DIAGNOSIS — Z79899 Other long term (current) drug therapy: Secondary | ICD-10-CM

## 2010-11-11 NOTE — Progress Notes (Signed)
Summary: pt call re results of labs  Phone Note Call from Patient   Caller: Patient [phone] Reason for Call: Talk to Nurse, Lab or Test Results Summary of Call: pt calling for lab results Initial call taken by: Glynda Jaeger,  November 06, 2010 2:26 PM  Follow-up for Phone Call        Pt aware of results.  Has lipid clinic appt on 2/20.  Weston Brass PharmD  November 06, 2010 3:34 PM

## 2010-11-17 NOTE — Assessment & Plan Note (Signed)
Summary: LIPID ROV/EAC RS PER PT CALL/MB/TT   Referring Provider:  Dr. Gala Romney Primary Provider:  Alroy Dust, MD   History of Present Illness: Lipid Clinic Visit  [name] presents to the clinic today for dyslipidemia follow-up.  She is currently on Simvastatin 40 mg daily and Fish oil 1 gram bid.  She is compliant with her medications, however she does describe joint pain that she understands is related to her DJD, osteopenia, and fibromyalgia. She describes the pain in her legs around her knee joints which she has had replaced. It is not believed that the pain is related to her statin.  Diet appears to be low-fat, and consists mainly of vegetables.  Pts husband is on a "vegan diet" and eats mainly raw vegetables.  Patient seldom eats meats but when she does it is usually chicken.  She denies eating sweets/desserts and reports mostly healthy snack options including fruit (apples, pears, bananas, grapes) or cereal.  The patient has stopped drinking Dr. Reino Kent and instead drinks decaffeinated tea. She does not drink any alcohol.  Since her last visit, the patient has started going to the [facility] daily. She bikes  ~2 miles which takes her about 20 minutes and then she uses the "hand machine". She does describe pain while exercising but has tried to push through the pain. She chooses not to do water aerobics because the cold water aggravates her joint pain.   Current Medications (verified): 1)  Metoprolol Tartrate 25 Mg Tabs (Metoprolol Tartrate) .[name].. 1 Tablet By Mouth Two Times A Day 2)  Simvastatin 40 Mg Tabs (Simvastatin) .... Take 1 Tab By Mouth At Bedtime.Marland KitchenMarland Kitchen 3)  Prilosec 20 Mg Cpdr (Omeprazole) .... Take 1 Tablet By Mouth Two Times A Day... 4)  Promethazine Hcl 25 Mg Tabs (Promethazine Hcl) .... Take One Tablet By Mouth Every 4-6 Hours As Needed Nausea 5)  Proctosol Hc 2.5 % Crea (Hydrocortisone) .... Apply As Directed... 6)  Meloxicam 7.5 Mg Tabs (Meloxicam) .... Take 1 Tab By Mouth  Once Daily As Needed For Arthritis Pain... 7)  Vitamin D 2000 Unit Tabs (Cholecalciferol) .... Take One Tablet By Mouth Once Daily 8)  Alprazolam 0.5 Mg  Tabs (Alprazolam) .... Take 1/2 To 1 Tab By Mouth Three Times A Day As Directed For Nerves.Marland KitchenMarland Kitchen 9)  Oxycodone-Acetaminophen 10-325 Mg Tabs (Oxycodone-Acetaminophen) .... Take One Tablet By Mouth Every 4 Hours As Needed For Pain 10)  Hydroxyzine Hcl 25 Mg Tabs (Hydroxyzine Hcl) .... Take 1 Tab By Mouth Every 6 H As Needed For Itching... 11)  Zithromax Z-Pak 250 Mg Tabs (Azithromycin) .... Take As Directed 12)  Fish Oil 1000 Mg Caps (Omega-3 Fatty Acids) .... Take 2 Capsules Two Times A Day  Allergies (verified): 1)  ! * Mepergan 2)  ! Diovan (Valsartan) 3)  ! * Contrast Dye  Social History:  Vital Signs:  Patient profile:   65 year old female Height:      66.5 inches Weight:      186 pounds Pulse rate:   66 / minute BP sitting:   135 / 65  (left arm)   Impression & Recommendations:  Problem # 1:  HYPERCHOLESTEROLEMIA (ICD-272.0) Lipid results are as follows:  TC 126 (goal <200), TG 238 (goal <150), HDL 27.4 (goal >50), LDL 65.5 (goal <100).  Patient is at goal for LDL and TC, but not TG or HDL. TG are improved from July '11 but stable since Oct '11. The patient is compliant with diet and has increased her  exercise tolerance (however this is limited due to her concurrent disease states--DJD, fibromyalgia, osteopenia).   Plan:  Gradually increase exercise tolerance to 3 miles or 30 minutes on the bike daily while continuing upper arm exercises.  Continue healthy dietary choices.  Increase fish oil to 2 grams twice daily. Follow-up in 4 months.  Her updated medication list for this problem includes:    Simvastatin 40 Mg Tabs (Simvastatin) .[name]... Take 1 tab by mouth at bedtime...    Fish Oil 1 gram..........................................Marland KitchenTake 2 tablets by mouth twice daily  Patient Instructions: 1)  Continue Simvastatin 40 mg at  bedtime 2)  Increase Fish Oil to 2 grams twice daily with meals 3)  Increase exercise to bike 3 miles or 30 minutes daily while continuing upper arm exercises at the [facility] 4)  Continue low-fat healthy diet 5)  Follow-up with lipid panel and office visit in 4 months

## 2010-11-17 NOTE — Letter (Signed)
Summary: [facility]  [facility]   Imported By: Sherian Rein 11/09/2010 14:53:34  _____________________________________________________________________  External Attachment:    Type:   Image     Comment:   External Document

## 2011-01-20 ENCOUNTER — Other Ambulatory Visit: Payer: Self-pay | Admitting: Hematology & Oncology

## 2011-01-20 ENCOUNTER — Encounter (HOSPITAL_BASED_OUTPATIENT_CLINIC_OR_DEPARTMENT_OTHER): Payer: 59 | Admitting: Hematology & Oncology

## 2011-01-20 DIAGNOSIS — D472 Monoclonal gammopathy: Secondary | ICD-10-CM

## 2011-01-20 LAB — CBC WITH DIFFERENTIAL (CANCER CENTER ONLY)
BASO%: 0.5 % (ref 0.0–2.0)
EOS%: 7.4 % — ABNORMAL HIGH (ref 0.0–7.0)
MCH: 27.6 pg (ref 26.0–34.0)
MCHC: 33.3 g/dL (ref 32.0–36.0)
MONO%: 5.1 % (ref 0.0–13.0)
NEUT#: 2.8 10*3/uL (ref 1.5–6.5)
Platelets: 224 10*3/uL (ref 145–400)
RDW: 12.8 % (ref 11.1–15.7)

## 2011-01-22 LAB — SPEP & IFE WITH QIG
Alpha-2-Globulin: 11.8 % (ref 7.1–11.8)
Gamma Globulin: 13.3 % (ref 11.1–18.8)
IgG (Immunoglobin G), Serum: 717 mg/dL (ref 694–1618)
IgM, Serum: 414 mg/dL — ABNORMAL HIGH (ref 60–263)
M-Spike, %: 0.41 g/dL
Total Protein, Serum Electrophoresis: 7.1 g/dL (ref 6.0–8.3)

## 2011-01-22 LAB — RETICULOCYTES (CHCC): Retic Ct Pct: 1.2 % (ref 0.4–3.1)

## 2011-01-22 LAB — LACTATE DEHYDROGENASE: LDH: 149 U/L (ref 94–250)

## 2011-01-25 ENCOUNTER — Encounter: Payer: Self-pay | Admitting: Pulmonary Disease

## 2011-01-27 ENCOUNTER — Ambulatory Visit (INDEPENDENT_AMBULATORY_CARE_PROVIDER_SITE_OTHER): Payer: 59 | Admitting: Pulmonary Disease

## 2011-01-27 ENCOUNTER — Encounter: Payer: Self-pay | Admitting: Pulmonary Disease

## 2011-01-27 DIAGNOSIS — IMO0001 Reserved for inherently not codable concepts without codable children: Secondary | ICD-10-CM

## 2011-01-27 DIAGNOSIS — M899 Disorder of bone, unspecified: Secondary | ICD-10-CM

## 2011-01-27 DIAGNOSIS — E8809 Other disorders of plasma-protein metabolism, not elsewhere classified: Secondary | ICD-10-CM

## 2011-01-27 DIAGNOSIS — I10 Essential (primary) hypertension: Secondary | ICD-10-CM

## 2011-01-27 DIAGNOSIS — K5730 Diverticulosis of large intestine without perforation or abscess without bleeding: Secondary | ICD-10-CM

## 2011-01-27 DIAGNOSIS — E7800 Pure hypercholesterolemia, unspecified: Secondary | ICD-10-CM

## 2011-01-27 DIAGNOSIS — K219 Gastro-esophageal reflux disease without esophagitis: Secondary | ICD-10-CM

## 2011-01-27 DIAGNOSIS — M1990 Unspecified osteoarthritis, unspecified site: Secondary | ICD-10-CM

## 2011-01-27 DIAGNOSIS — G894 Chronic pain syndrome: Secondary | ICD-10-CM | POA: Insufficient documentation

## 2011-01-27 DIAGNOSIS — L299 Pruritus, unspecified: Secondary | ICD-10-CM

## 2011-01-27 MED ORDER — HYDROXYZINE HCL 25 MG PO TABS: 25.0000 mg | ORAL_TABLET | ORAL | Status: DC | PRN

## 2011-01-27 NOTE — Patient Instructions (Signed)
Today we updated your med list in EPIC...    Continue your current meds the same...  Keep up the good work w/ your VEGAN diet & exercise program...  Call for any problems...  Let's plan a routine follow up in 6 months w/ FASTING blood work at that time.Marland KitchenMarland Kitchen

## 2011-01-30 ENCOUNTER — Encounter: Payer: Self-pay | Admitting: Pulmonary Disease

## 2011-01-30 NOTE — Progress Notes (Signed)
Subjective:    Patient ID: [name], female    DOB: 10/08/1945, 65 y.o.   MRN: [id number]  HPI 65 y/o WF here for a follow up visit... she has mult med problems and is well known to [place]... also followed in the Lipid Clinic, and by numerous specialists as noted below...   ~  Jan 26, 2010:  Sleep eval [name] w/ 11/10 study showing AHI= 25 w/ obstructive & central events... CPAP titration was suboptimal & she was tried on BiPAP... she tells me that she stopped all this when she lost weight & husb confirms no snoring or sleep disordered breathing- rests well etc...  she saw [name] for f/u MGUS & rising IgM level> he felt she was stable compared to his old values & rec CT Scans to r/o lymphoma (done 4/11 & neg- no lesions seen, mild hepatomegaly noted)...  she also followed by [name] for Urology w/ IC> on Elmiron orally & in bladder (she does self caths now)...  also followed by [name] in the Pain Center now on referral from Ortho> tried on NUCYNTA ("too expensive")...  also notes improvement in FLP/ lipid clinic f/u on Simva40 & weight loss via diet etc...  ~  May 28, 2010:  states tick bite 8/18 "[name] says it was a deer tic" on upper inner left bicep area... given Doxy x7d per [name] & no systemic symptoms (no HA, fever, change in FM symptoms) but notes rash, itching, sweats, & pain in arm... on exam> no signif rash, sm red spot where she says tick was removed (no resid parts left), no erythema migrans etc... we discussed checking CBC & Lyme titers; Rx w/ Atarax for itching.  ~  July 29, 2010:  states she's doing "so-so"> pain management & [facility]) sent her to [facility]- [name] for another opinion about her knee- notes pending & she requiring Oxycodone 15mg  Tid for elief...  BP is well controlled on meds;  she denies CP & notes rare self-lim palpit, SOB at baseline & too sedentary due to ortho probs;  Lipids stable on Simva40;  GI stable on numerous meds;  & continues on  Alpraz 3/d for nerves...  ~  Jan 27, 2011:  41mo ROV & she is basically stable> still fatigued and now c/o itching, no rash, & we discussed Atarax for Prn use;  She feels she has improved on vegan diet along w/ her husb & [facility];  She is still seeing Pain Management & says "they say I have inflammation in my body";  She recently saw [name] for her 41mo f/u Heme/Onc testing...          Problem List:  UNSPECIFIED SLEEP APNEA (ICD-780.57) - Complex sleep apnea - PSG 07/25/09 AHI 28 > ~  Sleep eval [name] w/ 11/10 study showing AHI= 25 w/ obstructive & central events... CPAP titration was suboptimal & she was tried on BiPAP... she tells me that she stopped all this when she lost weight & husb confirms no snoring or sleep disordered breathing- rests well etc...   ALLERGIC RHINITIS (ICD-477.9) Hx of SINUSITIS (ICD-473.9) - Hx sinusitis followed by [name] at [name]...  HYPERTENSION (ICD-401.9) - now on METOPROLOL 25mg  Bid... she was hosp 3/09 w/ gastroenteritis & BP meds were stopped due to weakness... grad restarted and increased w/ improvement in BP and palpitations... ~  11/11: BP= 122/70 she is INTOL to Diovan; denies HA, visual symptoms, CP, palpit, dizzy, syncope, edema; husb notes that  BPs are similar at home ~120/70's... ~  5/12:  BP= 112/70 & she has the same chr fatigue complaints, but no specific symptoms...  CHEST PAIN (ICD-786.50) - seen by [name] in 2006... ~  2DEcho 11/98 showed norm valves, norm LVF, ? min LVH... ~  cath 7/03 w/ norm coronaries, & norm LVF... ~  NuclearStressTest 2/06 showed no infarct, no ischemia, EF=65%... ~  Myoview repeated 4/10: low risk study w/ soft tissue attenuation, normal wall motion, EF= 78%...  PALPITATIONS, HX OF (ICD-V12.50) - notes occas fluttering and "jello feeling" in her chest... Metoprolol helps.  HYPERCHOLESTEROLEMIA (ICD-272.0) - on ZOCOR 40mg Qhs + Fish Oil > followed in the LC. ~  FLP 01/03/08 on SIMVASTATIN 20mg /d  showed TChol 206, TG 182, HDL 31, LDL 145... ~  FLP 6/09 (on Simva40) showed TChol 173, TG 199, HDL 31, LDL 102... continue f/u in lipid clinic. ~  FLP 11/09 (on Simva40) showed TChol 196, TG 265, HDL 32, LDL 119... she stopped lipid clinic. ~  Due to continued "side effects" she placed the Simvastatin on hold, then restarted 40mg /d... ~  FLP 6/10 showed TChol 218, TG 394, HDL 33, LDL 129...  ~  FLP 11/10 showed TChol 262, TG 568, HDL 31, LDL 144... she needs to ret to the Lipid Clinic. ~  FLP 1/11 after 20# wt loss showed TChol 144, TG 181, HDL 43, LDL 65 on Simva40 ~  FLP 10/11 on Simva40+FishOil showed TChol 145, TG 237, HDL 33, LDL 79 ~  FLP 2/12 on Simva40+FishOil showed TChol 126, TG 238, HDL 27, LDL 66  GERD (ICD-530.81) - on PRILOSEC 20mg /d...  ~  prev EGD 8/05 showed 3cmHH, gastric polyp... prev EGD w/ stricture dilated. ~  EGD repeated 12/09 & showed 5cm HH w/ healing ulcer, esophagitis, gastritis, etc... HPylori neg... REC> continue Prilosec, Phenergan. ~  she saw [name], [name] 8/10 for eval of nausea- no help from Reglan, Scopalamine, etc... had UGI/ Ba swallow- sm HH, mild reflux, norm peristalsis;  Gastric Emptying Scan- normal;  EGD 9/10 by [facility] w/ esoph ulcer seen, neg CLO, benign gastric polyp removed, Rx'd PPI. ~  5/12:  She states that she no longer has any GI issues on her vegan diet...  DIVERTICULOSIS OF COLON (ICD-562.10) - Rx w/ PROCTOSOL HC Cream Prn. COLONIC POLYPS (ICD-211.3) - colonoscopy 8/07 by [name] showed divertics only...  CYSTITIS (ICD-595.9) - she was seen at [facility] for poss interstitial cystitis... prev DMSO Rx. ~  Eval by [name] & pt tells me she is on Elmiron both orally & via bladder (self-cath). ~  9/11:  she reports IC is quiet & off Elmiron Rx now.  DEGENERATIVE JOINT DISEASE (ICD-715.90) - prev Rx MOBIC 7.5mg  Prn, now on OXYCODONE- taking daily for her pain per [name]... she had a right TKR 12/07 by [name]... then  required right Patella surg 10/08 from [name]... now s/p right TKR revision by [facility]Jenna at [facility]- they referred to pain management... ~  9/11: she tells me that [name] is referring her to [facility] for another opinion regarding her knees ([name]- there was nothing they could do & rec Pain Management[facility], & veggie diet).  BACK PAIN, LUMBAR (ICD-724.2) - she had a right L4-5 decompressive laminotomy w/ foraminotomy 12/02 by DrPool... then L4-5 decompression & fusion 9/09 by DrPool due to DDD w/ synovial cyst & degen spondylolithesis w/ stenosis... f/u XRays and MRI 5/10 showed fusion intact, NAD.[name]. ~  CHRONIC PAIN MANAGEMENT> followed by [name] clinic  now, on OXYCODONE 20mg  every 4H prn (ave 3/d she says), plus MOBIC 7.5mg /d.  FIBROMYALGIA (ICD-729.1) - she continues to have mult somatic complaints... she has seen DrDeveshwar in the past...  ~  She reports trial of Accupuncture> it helped the FM but not the knee pain  OSTEOPENIA (ICD-733.90) - I cannot locate recent BMD, we will discuss f/u study... VITAMIN D DEFICIENCY (ICD-268.9) -  ~  Vit D level was 5 (30-90) 11/08 & rec to start VitD 50K/wk... ~  Vit D level 11/09 = 45  ~  Vit D level 6/10 = 23 ~  Vit D level 11/10 = 35 ~  Vit D level 5/11 = 70... switched to Vit D 2000u daily.  ANXIETY (ICD-300.00) - on ALPRAZOLAM 0.5mg  as needed- taking 3 per day recently. DEPRESSION (ICD-311) ** see above **  Hx of PSORIASIS (ICD-696.1)  Hx of UNSPECIFIED DISORDER PLASMA PROTEIN METABOLISM (ICD-273.9) - she has a biclonal gammopathy evaluated 10/05 by [name] who thought it was reactive, perhaps a MGUS variant... ~  f/u labs 11/09 showed Hg= 12.2, TProt= 7.1, SPE/ IEP w/ biclonal IgM kappa protein present accounting for 0.31g/dl of the total 1.61 g/dl of protein in the gamma region & IgM level = 319 mg/dl... ~  labs 11/10 showed Hg= 12.5, TProt= 7.8, SPEP/IEP w/ monoclonal IgM kappa & IgM lamba proteins accounting for 0.44 g/dl of the  total 0.96 g/dl in the gamma region... IgM level = 490mg /dl... ~  11/10:  Referred back to [name] who has resumed follow up of this problem... ~  4/11:  [name] did CT Chest/ Abd/ Pelvis> neg w/o adenopathy etc... ~  Labs reviewed in EPIC flow sheet...   Past Surgical History  Procedure Date  . Abdominal hysterectomy   . Laminotomy   . Knee arthroscopy   . Total knee arthroplasty 12-07 and 4-07    Dr. Shelle Iron, revision 12-09 Dr. Eileen Stanford at [facility]  . Patella fracture surgery 10-08    Dr. Charlann Boxer  . Posterior laminectomy / decompression lumbar spine 9-09    Dr. Jordan Likes  . Cholecystectomy   . Total abdominal hysterectomy w/ bilateral salpingoophorectomy     Outpatient Encounter Prescriptions as of 01/27/2011  Medication Sig Dispense Refill  . ALPRAZolam (XANAX) 0.5 MG tablet Take 0.5 mg by mouth 3 (three) times daily as needed.        . Cholecalciferol (VITAMIN D) 2000 UNITS CAPS Take 1 capsule by mouth daily.        . fish oil-omega-3 fatty acids 1000 MG capsule Take 2 g by mouth 2 (two) times daily.        . hydrocortisone (ANUSOL-HC) 2.5 % rectal cream Place 1 application rectally 2 (two) times daily as needed.        . hydrOXYzine (ATARAX) 25 MG tablet Take 1 tablet (25 mg total) by mouth every 4 (four) hours as needed for itching.  100 tablet  11  . meloxicam (MOBIC) 7.5 MG tablet Take 7.5 mg by mouth daily.        . metoprolol tartrate (LOPRESSOR) 25 MG tablet Take 25 mg by mouth 2 (two) times daily.        [name] omeprazole (PRILOSEC) 20 MG capsule Take 20 mg by mouth daily.        [name] oxyCODONE-acetaminophen (PERCOCET) 10-325 MG per tablet Take 1 tablet by mouth every 4 (four) hours as needed.       . promethazine (PHENERGAN) 25 MG tablet Take 25 mg by mouth every 6 (  six) hours as needed.        . simvastatin (ZOCOR) 40 MG tablet Take 40 mg by mouth at bedtime.          Allergies  Allergen Reactions  . Mepergan (Meperidine-Promethazine)     Cardiac arrest  . Iohexol      Code: HIVES,  Desc: pt states her face and throat swells when administered IV contrast - KB, Onset Date: [id number]   . Valsartan     REACTION: pt states INTOL to Diovan    Review of Systems         See HPI - all other systems neg except as noted... The patient complains of dyspnea on exertion, muscle weakness, and difficulty walking.  The patient denies anorexia, fever, weight loss, weight gain, vision loss, decreased hearing, hoarseness, chest pain, syncope, peripheral edema, prolonged cough, headaches, hemoptysis, abdominal pain, melena, hematochezia, severe indigestion/heartburn, hematuria, incontinence, suspicious skin lesions, transient blindness, depression, unusual weight change, abnormal bleeding, enlarged lymph nodes, and angioedema.     Objective:   Physical Exam     WD, WN, 65 y/o WF in NAD... GENERAL:  Alert & oriented; pleasant & cooperative... HEENT:  Johnson/AT, EOM-wnl, PERRLA, EACs-clear, TMs-wnl, NOSE-clear, THROAT-clear & wnl. NECK:  Supple w/ fairROM; no JVD; normal carotid impulses w/o bruits; no thyromegaly or nodules palpated; no lymphadenopathy. CHEST:  Clear to P & A; without wheezes/ rales/ or rhonchi. HEART:  Regular Rhythm; without murmurs/ rubs/ or gallops. ABDOMEN:  Soft & nontender; normal bowel sounds; no organomegaly or masses detected. EXT: s/p right TKR, mod arthritic changes; no varicose veins/ +venous insuffic/ tr edema. BACK:  scar of surgery, +trigger points... NEURO:  CN's intact;  no focal neuro deficits... DERM:  tiny red spot upper inner left arm bicep area from prev tick bite- no tick remnants... dry skin, no signif rash noted.   Assessment & Plan:   HBP>  BP controleed on simple med, continue same + diet 7 exercise...  CHOL>  Followed in Lipid clinic & improved, TG still elev & instructed on better low fat diet...  GI>  She denies GI issues at present...  GU>  Similarly the prev IC is improved & hasn't been on meds (Elmiron) for many months...  DJD/ FM/  Chr Pain>  She is followed in the Pain management clinic as noted above...  Myeloma vs MGUS>  Followed by [name], we do not have recent notes.Marland KitchenMarland Kitchen

## 2011-02-02 NOTE — Assessment & Plan Note (Signed)
[facility]                             PULMONARY OFFICE NOTE   NAME:[name]                     MRN:          [id number]  DATE:02/17/2007                            DOB:          24-Jan-1946    HISTORY OF PRESENT ILLNESS:  The patient is a 65 year old white female  patient of Dr. Jodelle Green who has a known history of hyperlipidemia.  Presents today for an acute office visit.  The patient complains that  this morning she started to sit down and missed the chair, falling onto  the floor striking the back of her head onto the corner of a desk.  The  patient complains that she has a small laceration on the posterior scalp  region.  The patient denies any loss of consciousness, visual  disturbances, speech changes, extremity weakness, chest pain,  palpitations.  The patient did place a bandage and ice pack on it and  bleeding has stopped.  Patient denies any nausea, vomiting, or headache.   PAST MEDICAL HISTORY:  Reviewed.   CURRENT MEDICATIONS:  Reviewed.   PHYSICAL EXAMINATION:  The patient is pleasant female in no acute  distress, She is afebrile.  Blood pressure 138/84, O2 saturation 98% on  room air.  HEENT:  PERRLA.  EOMI without nystagmus.  Posterior pharynx is clear.  Nasal mucosa pink and moist.  TMs are normal.  EACs are clear.  NECK:  Supple without adenopathy.  LUNGS:  Sounds are clear.  CARDIAC:  S1-S2 without murmur, rub or gallop.  ABDOMEN:  Soft and nontender.  EXTREMITIES:  Warm without any edema.  NEURO:  Cranial nerves II-XII are intact.  Equal strength of the upper  and lower extremities.  No focal deficits detected.   The posterior scalp reveals a 0.25 sonometer very small linear  laceration with no active bleeding noted.   IMPRESSION AND PLAN:  Fall with scalp laceration.  The patient is to  keep area clean and dry.  Watch for any signs of infection such as  redness, fever, or increased swelling.  Patient is also to  report if she  gets any nausea, vomiting, severe headaches, or symptoms worsen.  The  patient is to put ice p.r.n. and may use Tylenol p.r.n. as well.  The  patient is to follow up as scheduled or sooner if needed.      Rubye Oaks, NP  Electronically Signed      Lonzo Cloud. Kriste Basque, MD  Electronically Signed   TP/MedQ  DD: 02/17/2007  DT: 02/17/2007  Job #: [id number]

## 2011-02-02 NOTE — Assessment & Plan Note (Signed)
[facility]                               LIPID CLINIC NOTE   NAME:Earnhardt, JEIDY HOERNER                     MRN:          [id number]  DATE:07/25/2008                            DOB:          1946-01-22    Ms. Norem comes in today for followup of her hyperlipidemia therapy  which includes Zocor 40 mg daily.  She has been compliant with this  therapy not tolerating it just fine.  She is tolerating it just fine  with no muscle aches or pains per report.   PHYSICAL EXAMINATION:  VITAL SIGNS:  Weight is 197 pounds, blood  pressure is 145/95, heart rate is 56.   LABORATORY DATA:  Total cholesterol 196, triglycerides 265, HDL 31.8,  and LDL 118.8.  Liver function tests are within normal limits.   ASSESSMENT:  Ms. Niesen triglycerides and LDL are not at are above  the goals and HDL is below the goal.  She has been compliant with her  Zocor, but she has not been able to do much physical activity lately due  to recent back surgery.  Of note, she also has been very fatigued over  the last week after her metoprolol dose was doubled by Dr. Kriste Basque.  I  told her that the possibility of beta-blockers and to follow up with  him, if it does not get be any better after another couple days.  We  talked about different options for achieving her lipid goals, one being  increasing the statins dose, but she was really resistant to this.  She  also was pretty resistant to adding another an additional medication for  cholesterol, but I talked to her how important it is to get her LDL down  to less than 100.  I talked also about Zetia and its mechanism of action  and how it not likely the cause muscle aches and pains like statins have  done in the past.  So the plan is to continue with Zocor 40 mg daily and  add Zetia 10 mg daily.  I gave her a discount card and sent a  prescription to [facility] on phoning.  I asked her to continue with heart  healthy diet and to start exercising  just whenever she can answer back  pain diminishes.  Followup with [place] will be in 4 months with labs with  [place] at the [place] office.  The patient was really not feeling well today  was in hurry to go home.  Her husband was here to take her home and we  did not make an appointment for followup, but we will do that and she  was instructed to call us with any additional problems.      Charolotte Eke, PharmD  Electronically Signed      Rollene Rotunda, MD, [facility]  Electronically Signed   TP/MedQ  DD: 07/25/2008  DT: 07/26/2008  Job #: [id number]

## 2011-02-02 NOTE — Assessment & Plan Note (Signed)
[facility]                               LIPID CLINIC NOTE   NAME:[name], Lisa SIRAGUSA                     MRN:          [id number]  DATE:01/12/2008                            DOB:          October 29, 1945    ADDENDUM:  [name] called today and reported to [place] that she has been  experiencing muscle joint and muscle pain with her Zocor.  She was seen  in lipid clinic within the last couple of weeks where we have increased  her Zocor from 20 mg to 40 mg.  She asked if she could perhaps take 30  mg of Zocor so take 1-1/2 of her 20 mg tablets to see if this would  relieve any muscle joint pain and I told her that would be fine.  I  instructed her that if the muscle and joint pain continues on the 30 mg  of Zocor daily then she could drop back down to the 20 mg of Zocor  daily.  I also instructed her that she could call us if she has any  further symptoms and we will still recheck her lipid and liver panel as  previously scheduled.     ______________________________  Gerre Pebbles, PharmD    ______________________________  Shelby Dubin, PharmD, BCPS, CPP   MW/MedQ  DD: 01/12/2008  DT: 01/12/2008  Job #: [id number]

## 2011-02-02 NOTE — Assessment & Plan Note (Signed)
[facility]                               LIPID CLINIC NOTE   NAME:[name], [name]                     MRN:          [id number]  DATE:03/04/2008                            DOB:          [birth year]    Ms. Becknell was seen in Lipid Clinic for further evaluation, medication  titration associated with her hyperlipidemia.  She has been feeling and  doing okay overall.  She has been under increased stress at work.  She  has been relatively compliant with her Zocor 30 mg each day.  She has  had lessened muscle discomfort and reduction to 30 or 20 mg of statin  each day.   PAST MEDICAL HISTORY:  1. Hyperlipidemia.  2. Fibromyalgia.  3. Chronic sinusitis.  4. Hypertension.  5. High-cholesterol.  6. GERD.  7. Stress dermatitis.  8. History of palpitation  9. DJD.   CURRENT MEDICATIONS:  1. Lisinopril 10 mg daily.  2. Alprazolam 0.25 mg daily at bedtime.  3. Mavik 7.5 mg twice daily.  4. Zyrtec 10 mg daily.  5. Diovan 320 mg daily.   LABORATORY DATA:  Labs on February 28, 2008, revealed normal LFTs.  Total  cholesterol 173, triglyceride 199, and LDL 102.  The patient's  approaching her lipid-lowering therapy goals at this time.  I have asked  her to work on increasing her dietary efforts, continue on 20-30 mg of  Zocor daily.  We will have to consider addition of additional agent in  the future if this does not work  such as Educational psychologist.  We will see her back in 3-6 months.  I appreciate the  opportunity to see this pleasant patient.      Shelby Dubin, PharmD, BCPS, CPP  Electronically Signed      Rollene Rotunda, MD, [facility]  Electronically Signed   MP/MedQ  DD: 03/13/2008  DT: 03/14/2008  Job #: [id number]   cc:   Madolyn Frieze. Jens Som, MD, [facility]

## 2011-02-02 NOTE — Discharge Summary (Signed)
[name], [name]              ACCOUNT NO.:  [id number]   MEDICAL RECORD NO.:  [id number]          PATIENT TYPE:  INP   LOCATION:  1506                         FACILITY:  [facility]   PHYSICIAN:  Lonzo Cloud. Kriste Basque, MD     DATE OF BIRTH:  [birth year]   DATE OF ADMISSION:  12/18/2007  DATE OF DISCHARGE:  12/21/2007                               DISCHARGE SUMMARY   FINAL DIAGNOSIS:  1. Admitted December 18, 2007 with nausea, vomiting, weakness, dizziness,      and inability to keep anything on her stomach.  Evaluation revealed      probable viral gastroenteritis which resolved with IV fluids and      symptomatic care.  2. History of hiatus hernia and gastroesophageal reflux disease.  3. Hypertension.  Blood pressure medicine is discontinued this      hospitalization, and BP remained stable in the 120-136 range      throughout.  4. History of degenerative arthritis with previous total knee      replacement.  5. History of fibromyalgia.  6. History of anxiety.  7. Hypercholesterolemia, controlled on simvastatin.   BRIEF HISTORY AND PHYSICAL:  The patient is a 62-year white female with  history of hypertension, hyperlipidemia, and gastroesophageal reflux  disease.  She presented to the office with severe nausea, vomiting, and  dry heaves.  She was dizzy and weak and could not keep anything on her  stomach.  She was admitted with a suspected gastroenteritis, and severe  systemic symptoms.   PAST MEDICAL HISTORY:  She has a history of hypertension, most recently  on Diovan/HCT.  She had been complaining about her blood pressure,  stating that it was going too high part of the day, and going to low  part of the day.  She apparently salts her food quite heavily.  She was  quite weak with her gastroenteritis, and all blood pressure meds were  discontinued during this hospitalization.  She has a history  gastroesophageal reflux disease, hiatus hernia, and colon polyps.  She  has a history of  fibromyalgia which had been quite severe.  She has  hypercholesterolemia controlled on Zocor.  She has a his anxiety and  palpitations.   PHYSICAL EXAMINATION:  GENERAL:  On admission revealed a 62-year white  female in no acute distress.  VITAL SIGNS:  Blood pressure 136/90, pulse 84 and regular, respirations  16 per minute and not labored, temperature 98 degrees.  HEENT:  Exam was unremarkable except for dry mucous membranes.  NECK:  Exam showed no jugular distention, no carotid bruits, no  thyromegaly, nor lymphadenopathy.  CHEST:  Exam was clear to percussion and auscultation.  CARDIAC:  Exam revealed a regular rhythm.  No murmurs, rubs, or gallops  heard.  ABDOMEN:  Soft with increased bowel sounds, slightly tender in the  epigastrium.  No organomegaly or masses detected.  EXTREMITIES: Showed no cyanosis, clubbing, or edema.  SKIN:  Negative.   LABORATORY DATA:  Chest x-ray was clear showing no acute disease.  Abdominal films were normal with a normal bowel gas pattern.  Abdominal  ultrasound  was performed showing evidence of previous cholecystectomy,  otherwise normal.  Hemoglobin 14, hematocrit 41, white count 10,000 with  68 segs.  Sodium 141, potassium 4.1, chloride 103, CO2 29, BUN 13,  creatinine 1.0, blood sugar 100.  Liver enzymes were all normal.  Albumin 4.0, amylase 19.  Cardiac panel negative.  Lipase 26.  Urinalysis showed some white cells and bacteria and culture grew 100,000  colonies of Enterococcus species that was sensitive to ampicillin.   HOSPITAL COURSE:  The patient's blood pressure medicines were  discontinued, and she was placed on IV fluids and a clear liquid diet.  She improved clinically.  Initial studies were normal, as noted above.  Her IV fluids were weaned, and her oral intake and diet were advanced  slowly.  The patient was ambulated on the ward and showed no evidence of  postural blood pressure changes.  Frequent blood pressure monitoring   revealed her blood pressure to range between 114/62 on the low, and  136/78 on the high, without any blood pressure medications.  She did  have a urinary tract infection with a sensitive Enterococcus species.  She was started on ampicillin for this.   MEDICATIONS AT DISCHARGE:  1. Amoxicillin 500 mg p.o. t.i.d. till gone.  2. Prilosec 20 mg p.o. daily taken 30 minutes before the first meal of      the day.  3. Mobic 7.5 mg p.o. daily taken with food as needed for arthritis.  4. Zocor 20 mg p.o. nightly.  5. Alprazolam 0.25 mg p.o. t.i.d.  6. The patient was instructed to STOP her previous Diovan/HCT and all      of her previous blood pressure medications for the time being.  7. She will follow a low-sodium diet, and monitor her blood pressure      at home; and we will reassess the need for blood pressure meds with      a follow up visit in 2 weeks.   CONDITION ON DISCHARGE:  Improved.   DISPOSITION:  The patient is to be discharged home.  Will follow up in  office in 2 weeks.      Lonzo Cloud. Kriste Basque, MD  Electronically Signed     SMN/MEDQ  D:  12/21/2007  T:  12/21/2007  Job:  [id number]

## 2011-02-02 NOTE — Op Note (Signed)
[name], [name]              ACCOUNT NO.:  [id number]   MEDICAL RECORD NO.:  [id number]          PATIENT TYPE:  AMB   LOCATION:  DAY                          FACILITY:  [facility]   PHYSICIAN:  Madlyn Frankel. Charlann Boxer, M.D.  DATE OF BIRTH:  04-08-1946   DATE OF PROCEDURE:  07/12/2007  DATE OF DISCHARGE:                               OPERATIVE REPORT   PREOPERATIVE DIAGNOSIS:  Suprapatellar scarring status post total knee  replacement consistent with a diagnosis of patellar clunk.   POSTOPERATIVE DIAGNOSIS:  Suprapatellar scarring status post total knee  replacement consistent with a diagnosis of patellar clunk.   FINDINGS:  The patient did have an abundant amount of scar in the  anterior proximal aspect of her knee both on the medial and lateral  aspects of her quadriceps tendon incision for medial arthrotomy.  There  was also noted to be a prominent lateral facet.   PROCEDURES:  1. Right knee open scar debridement with synovectomy carried out as      well.  2. Patellaplasty or lateral facetectomy removing some of the bone off      the lateral facet of patella.   SURGEON:  Madlyn Frankel. Charlann Boxer, M.D.   ASSISTANT:  None.   ANESTHESIA:  General LMA.   DRAINS:  One.   TOURNIQUET TIME:  25 minutes at 250 mmHg.   COMPLICATIONS:  None.   INDICATIONS:  Ms. Cashaw is a 65 year old female with a history of  total knee replacement approximately 10 months ago.  From a range of  motion standpoint she had done outstanding, however she had persistent  problems coming from the knee for extended into flexed posture and vice  versa with a palpable, audible, painful crunching and popping sensation  anteromedial.  This was palpable at the time of her range of motion.  Based on the persistence of her symptoms and despite attempts at anti-  inflammatories and therapy, we discussed surgical options.  Risks and  benefits of recurrence of this type of symptoms were all discussed.  Risks of infection and  means to prevent this were all reviewed.  Consent  obtained.   PROCEDURE IN DETAIL:  The patient was brought to the operative theater.  Once adequate anesthesia, preoperative antibiotics, 2 grams of Ancef,  administered the patient was positioned supine.  A proximal thigh  tourniquet was placed.  The patient's old incision was excised with scar  revision.   Median arthrotomy was carried out.  It was immediately evident upon  opening the knee of normal effusion in the knee.  Nothing would appear  infectious.  There was a lot of scar and loose synovial scar over the  anterior proximal aspect of the knee as expected on exam.  With the knee  exposed, attention now was directed to a sharp excision of this scar and  synovium in the suprapatellar pouch.  Once I was satisfied I was down to  tendinous layers, I was able to partially evert the patella and  identified this lateral facet.  I debrided soft tissues off that and  using a rongeur debrided it back  to a stable boundary.   Following this debridement and facetectomy on the lateral aspect of the  patella, I assessed for range of motion of the knee and it was normal  without any mechanical changes.  I irrigated the knee with normal saline  pulse lavage at this point approximately 300 mL.  A medium Hemovac drain  was placed deep.  The extensor mechanism was then reapproximated using  #1 Vicryl.  The remainder of the wound was closured with 2-0 Vicryl and  running 4-0 Monocryl.  The patient was then brought to the recovery room  with a sterile bulky wrap in place.      Madlyn Frankel Charlann Boxer, M.D.  Electronically Signed     MDO/MEDQ  D:  07/12/2007  T:  07/13/2007  Job:  [id number]

## 2011-02-02 NOTE — Assessment & Plan Note (Signed)
[facility]                               LIPID CLINIC NOTE   NAME:[name]                     MRN:          [id number]  DATE:01/08/2008                            DOB:          01-17-1946    [name] is seen in the lipid clinic for further evaluation of  medication titration associated with her hyperlipidemia.  She is  currently maintained on Zocor 20 mg daily at bedtime.  Of note, Ms.  [name] was hospitalized in March for four days due to gastroenteritis.  Zocor was discontinued during that time, but since that time, she has  been compliant and tolerating medications without issue.  Her breakfast  includes hash browns and/or an orange.  Lunches and suppers are  oftentimes salads with vinegar and a grapeseed oil dressing.  Dinner  includes beans and rice.  Of note, she consumes approximately 30 ounces  of regular soda each day.  She has been asked not to exercise since her  knee surgery in October, 2008 by her surgeon, and she has continued to  comply with this request.   PAST MEDICAL HISTORY:  1. Fibromyalgia.  2. Acid reflux.  3. Hyperlipidemia.   CURRENT MEDICATIONS:  1. Alprazolam 0.25 mg daily.  2. Prilosec 20 mg daily.  3. Mobic 7.5 mg twice daily.  4. Diovan has been discontinued in the short term due to some      hypotension.  5. Zocor 20 mg daily at bedtime.   LABS:  From January 03, 2008:  Total cholesterol 206, triglycerides 182,  HDL 30.7, LDL 145.2.  LFTs are within normal limits.   PHYSICAL EXAMINATION:  Blood pressure is 144/95, heart rate 68,  respirations 18.   ASSESSMENT:  [name] LDL, triglycerides, and HDL are not at goal.  Her triglycerides are significantly improved, but the LDL is higher.  The diet is okay.  We have pointed out some areas where we can make  improvements, including reducing white potato, white bread, and white  sugar intake and changing those over to other substitutes that are more  difficult to the body to use.   PLAN:  1. Patient will continue with her diet and make those improvements      that we have discussed.  2. We will continue to avoid walking as an exercise, as we need to      allow continued time for the knee to rehabilitate.  3. Will increase Zocor to 40 mg daily at bedtime.  4. She will call with questions or problems in the meantime.  Will see      her back in seven weeks on March 04, 2008.  She has been given our      contact numbers with questions or problems in the meantime.   Thank you for the opportunity to see this pleasant patient.   ADDENDUM:  [name] called today and reported to [place] that she has been  experiencing muscle joint and muscle pain with her Zocor.  She was seen  in lipid clinic within the last couple of weeks where we  have increased  her Zocor from 20 mg to 40 mg.  She asked if she could perhaps take 30  mg of Zocor so take 1-1/2 of her 20 mg tablets to see if this would  relieve any muscle joint pain and I told her that would be fine.  I  instructed her that if the muscle and joint pain continues on the 30 mg  of Zocor daily then she could drop back down to the 20 mg of Zocor  daily.  I also instructed her that she could call us if she has any  further symptoms and we will still recheck her lipid and liver panel as  previously scheduled.      [name], PharmD, BCPS, CPP  Electronically Signed      Lisa Frieze. Jens Som, MD, [facility]  Electronically Signed   MP/MedQ  DD: 01/10/2008  DT: 01/10/2008  Job #: [id number]   cc:   Lisa Rotunda, MD, [facility]

## 2011-02-02 NOTE — Assessment & Plan Note (Signed)
[facility]                                 ON-CALL NOTE   NAME:[name], [name]                     MRN:          [id number]  DATE:07/28/2008                            DOB:          April 09, 1946    TIME OF SERVICE:  4:50 p.m.   The patient was calling regarding nausea.  The patient states that last  week her blood pressure medications were increased and 2 days later, she  started to having nausea and emesis of white foamy material.  She had  contacted Dr. Kriste Basque and he prescribed Phenergan 25 mg suppository q.12  h.  The patient states that despite this, she continues to have nausea  with some dry heaving.  No fevers or chills.  She denies abdominal pain.  She denies lightheadedness or dizziness.  She is still able to tolerate  fluids and eat some crackers.  She does have some increase in her  heartburn symptoms and has increased her Prilosec to 40 mg today.  She  denies any recent dietary in discression.  She did have a pizza several  days before the nausea started.  No one else that ate the pizza got  sick.  No other sick contacts.  Her bowel movements are somewhat loose,  but still well-formed.  She denies any hematemesis.  She denies any  hematochezia or melena.  Recommended for the patient to increase her  Phenergan suppositories to q.6 h. and to seek medical attention in the  emergency room.     Emmit Pomfret, MD  Electronically Signed    HM/MedQ  DD: 07/28/2008  DT: 07/29/2008  Job #: [id number]   cc:   Lonzo Cloud. Kriste Basque, MD

## 2011-02-02 NOTE — H&P (Signed)
[name], [name]              ACCOUNT NO.:  [id number]   MEDICAL RECORD NO.:  [id number]          PATIENT TYPE:  OBV   LOCATION:  1339                         FACILITY:  [facility]   PHYSICIAN:  Iva Boop, MD,FACGDATE OF BIRTH:  Jun 08, 1946   DATE OF ADMISSION:  08/27/2008  DATE OF DISCHARGE:                              HISTORY & PHYSICAL   HISTORY OF PRESENT ILLNESS:  [name] has been seen recently in our  office for epigastric discomfort, nausea, and vomiting.  The patient  believes her symptoms started back in September 2009, at the time her  beta blocker was increased.  The nausea is constant, that the patient  rarely vomits.  She has epigastric burning, even with water.  The  epigastric burning at times radiates through to her upper mid back.  An  ultrasound of the abdomen, LFTs, amylase, lipase, and CBC have all been  normal.  Because of the patient's symptoms, she underwent an EGD which  showed a healing esophageal ulcer and some gastritis.  Helicobacter  pylori was negative.  The patient has been on twice daily Protonix, as  well as Phenergan suppositories.  Her nausea is somewhat relieved with  Phenergan suppositories, but the patient is still unable to eat or drink  because of the epigastric burning.  The burning is exacerbated but not  caused by eating.  The patient has not had any significant weight loss  since her symptoms began in September 2009.  No fevers.  She started  having some loose stool yesterday.  The patient did have some  antibiotics recently for dental work.  [name] denies dysphagia, but  does complaint of a fullness in her throat and chest.  No shortness of  breath.   PAST MEDICAL HISTORY:  1. Hypertension.  2. Dyslipidemia.  3. GERD.  4. Diverticulosis.  5. Fibromyalgia.  6. Osteopenia.  7. Vitamin D deficiency.  8. Anxiety.   PAST SURGICAL HISTORY:  1. Cholecystectomy.  2. Recent lumbar decompression.  3. Hysterectomy.   FAMILY  MEDICAL HISTORY:  Breast cancer, hypertension, peripheral  vascular disease, and lupus   SOCIAL HISTORY:  The patient is married, without children.  Nonsmoker.  No alcohol.   HOME MEDICATIONS:  1. Metoprolol 25 mg twice daily.  2. Simvastatin 40 mg daily.  3. Protonix 40 mg twice a day.  4. Xanax 0.5 mg 1/2-1 tablet 3 times a day as needed.  5. Vitamin D  6. Phenergan suppositories.  7. Percocet as needed.   ALLERGIES:  1. MEPERGAN.  2. DIOVAN.   REVIEW OF SYSTEMS:  No weight loss.  No fevers.   REVIEW OF SYSTEMS:  All review of systems negative, other than were  noted in the HPI.   PHYSICAL ASSESSMENT:  VITAL SIGNS:  Weight 188 pounds, blood pressure  112/72, heart rate 78.  GENERAL:  [name] is a pleasant white female, lying on the examining  table in our office, in no acute distress.  HEENT:  No scleral icterus.  Conjunctiva pink.  NECK:  Supple.  No palpable lymphadenopathy.  CARDIAC: Regular rate and rhythm.  No appreciable murmurs.  RESPIRATORY:  Bilateral lung fields clear.  GI:  Normoactive bowel sounds.  The abdomen is soft, nondistended.  There is mild to moderate epigastric tenderness to palpation.  No  obvious palpable masses.  No obvious organomegaly.  EXTREMITIES:  No lower extremity edema.  SKIN:  Intact.  No significant rashes or lesions.  NEUROLOGIC: Alert and oriented.  PSYCHOLOGICAL:  Cooperative, but appears depressed.   LABORATORY STUDIES TO DATE:  The patient's LFTs, CBC, amylase, and  lipase have been normal.   IMPRESSION:  1. Persistent epigastric burning, nausea, and chest discomfort      described as fullness or feeling like something is stuck..      Esophagogastroduodenoscopy recently showed a healing esophageal      ulcer, possibly secondary to severe esophagitis.  The patient's      symptoms are refractory to twice daily proton pump inhibitor.  She      is unable to consume even water at this point secondary to nausea      and  epigastric discomfort.  2. Diarrhea, acute.  The patient has been on recent antibiotics.  3. Gastroesophageal reflux disease.  The patient is on b.i.d. proton      pump inhibitor.  4. Dyslipidemia, on statin at home.  5. Fibromyalgia.  Was on Mobic.  Recently stopped medication.  6. Recent lumbar decompression.  7. Anxiety.  Xanax at home.  8. Abnormal protein electrophoresis.   PLAN:  1. The patient will be admitted to the hospital for hydration, pain      management, and antiemetics.  2. We will continue most of her home medications.  3. We will try Zofran IV for nausea.  4. Given recent abnormal electrophoresis, we will order urine for      Bence-Jones protein.  This is for recent abnormal protein      electrophoresis.  5. We will obtain a CT of the abdomen with p.o. and IV contrast if      creatinine is acceptable.      Willette Cluster, NP      Iva Boop, MD,FACG  Electronically Signed    PG/MEDQ  D:  08/29/2008  T:  08/29/2008  Job:  [id number]

## 2011-02-02 NOTE — Assessment & Plan Note (Signed)
[facility]                               LIPID CLINIC NOTE   NAME:[name], [name]                     MRN:          [id number]  DATE:03/02/2007                            DOB:          [birth year]    Return office visit for lipid clinic.   PAST MEDICAL HISTORY:  1. Hyperlipidemia.  2. Hypertension.  3. Osteoarthritis.  4. Fibromyalgia.   MEDICATIONS:  1. Lisinopril 10 mg daily.  2. Alprazolam 0.25 mg at bedtime.  3. Prilosec 20 mg daily.  4. Mobic 7.5 mg twice daily.  5. Lipitor 20 mg every other day.  6. Zyrtec 10 mg daily.   VITAL SIGNS:  Weight 184 pounds, blood pressure 124/70, heart rate 70.   LABORATORY DATA:  Total cholesterol 187, triglycerides 205, HDL 34 and  LDL 114.   ASSESSMENT:  Ms. Safley is a pleasant woman who returns to lipid clinic  today with no chest pain, no shortness of breath, no muscle aches or  pain.  She is compliant with medication regimen, however, she does  complain of fatigue with her Lipitor.  She said that she noticed this in  the past with Lipitor, however, she has not tolerated other Statins and  is agreeable to continue the Lipitor for now and see if the side effect  wears off.  She has been eating low fat diet, has decreased the amount  of Dr. Reino Kent to once a day and increased her amount of water.  Her  biggest vice currently is tortilla with onion sandwich with salt and  paper.  In light of her triglycerides increasing significantly since  last visit, she will go home and check the amount of carbohydrates on  her tortilla shells and follow up serving size.  Besides her tortilla  onion sandwich, she follows a fairly low fat diet.  She eats oatmeal in  the morning for breakfast and mostly grilled chicken and salads for  evening meals.  She does very little snacking throughout the day.  She  walks 10 minutes on a treadmill.  She bikes five miles and does leg  weights three times a week at the gym.   She is extremely disappointed in  her lipid panel today.  She had previously thought that it would be much  improved since she had a lipid check about a month ago at [facility] which  showed better results than what is currently available today.   PLAN:  1. Continue Lipitor 20 mg every other day.  2. Continue exercise regimen.  3. Continue low fat and encourage low carbohydrate diet.  4. Follow-up visit in four months for lipid panel and LFTs and will      make adjustments at that time.      Leota Sauers, PharmD  Electronically Signed      Jesse Sans. Daleen Squibb, MD, [facility]  Electronically Signed   LC/MedQ  DD: 03/02/2007  DT: 03/03/2007  Job #: [id number]

## 2011-02-02 NOTE — Op Note (Signed)
[name], GIERKE              ACCOUNT NO.:  [id number]   MEDICAL RECORD NO.:  [id number]          PATIENT TYPE:  INP   LOCATION:  3001                         FACILITY:  [facility]   PHYSICIAN:  Kathaleen Maser. Pool, M.D.    DATE OF BIRTH:  [birth year]   DATE OF PROCEDURE:  05/21/2008  DATE OF DISCHARGE:                               OPERATIVE REPORT   PREOPERATIVE DIAGNOSES:  L4-5 bilateral synovial cysts with stenosis, L4-  5 degenerative/postlaminectomy spondylolisthesis.   POSTOPERATIVE DIAGNOSES:  L4-5 bilateral synovial cysts with stenosis,  L4-5 degenerative/postlaminectomy spondylolisthesis.   PROCEDURES:  L4-5 re-exploration of laminotomy with bilateral L4-5  decompressive laminectomy and foraminotomies, more than would be  required for simple interbody fusion alone.  Resection of bilateral L4-5  synovial cyst.  L4-5 posterior lumbar fusion utilizing Tangent interbody  allograft wedge, Telamon interbody PEEK cage and local autografting.  L4-  5 posterolateral arthrodesis utilizing nonsegmental pedicle screw  fixation and local autografting.   SURGEON:  Kathaleen Maser. Pool, MD   ASSISTANT:  Reinaldo Meeker, MD   ANESTHESIA:  General endotracheal.   INDICATIONS:  [name] is a 65 year old female status post previous  right-sided L4-5 laminotomy for stenosis.  The patient presents now with  severe back and bilateral lower extremity pain, right greater than left.  Workup demonstrates evidence of an early grade 1  degenerative/postlaminectomy spondylolisthesis with a very large left-  sided synovial cyst and a right-sided L4 foraminal synovial cyst causing  severe stenosis.  The patient had been counseled as to her options.  She  decided to proceed with a lumbar decompression and fusion surgery in  hopes of improving her symptoms.   OPERATIVE NOTE:  The patient was brought to the operating room and  placed on operating table in a supine position after adequate level of  anesthesia  was achieved.  The patient was placed in prone onto Wilson  frame.  Appropriately padded the patient's lumbar region and prepped and  draped sterilely.  A 10 blade was used to make a curvilinear skin  incision overlying the L4-5 level.  This was carried down sharply in the  midline.  Subperiosteal dissection was then performed exposing the  lamina and facet joints of L3, L4, and L5 as well as the transverse  processes of L4 and L5.  Deep self-retaining retractor was placed.  Intraoperative x-rays were taken and level was confirmed.  Laminotomy on  the right-side at L4-5 was dissected free.  Complete decompressive  laminectomy was then performed using Leksell rongeurs, Kerrison  rongeurs, and the high-speed drill.  The entire lamina of L4 was  removed.  The inferior facets of L4 removed bilaterally.  Superior  facets of L5 removed bilaterally.  Superior aspect of the lamina of L5  was removed bilaterally.  Ligamentum flavum and the epidural scar were  then elevated and resected in usual fashion using Kerrison rongeurs.  Bilateral synovial cysts were encountered.  These were dissected free  using dental instruments and completely resected.  Underlying thecal sac  and exiting L4 and L5 nerve roots were identified bilaterally.  Epidural  venous plexus was coagulated and cut.  Starting first at the patient's  left side, disk space was then incised with #15 blade.  First diskectomy  was then performed on the left side and then repeated on the right side.  The interbody fusion was then performed using Tangent instrument.  A 10  x 26 mm Tangent wedge was placed in the patient's left side.  A 10 x 26  mm Telamon cage was placed on the right side.  The cage was packed with  morcellized autograft.  Prior to placement of the cage, morcellized  autograft was then packed in the interspace.  Pedicle screws were then  placed at L4-5 under fluoroscopic guidance.  A 6.75 x 45 mm radius  screws were used  bilaterally at L4 and L5.  Transverse processes were  decorticated using the high-speed drill.  Morcellized autograft was  packed posterolaterally for later fusion.  Short segment titanium rods  were placed over screw heads at L4 and L5.  Locking caps were placed  over the screw heads and locking caps were then engaged with construct  under compression.  Final images revealed good position of bone grafts,  hardware, with proper operative level and normal alignment of spine.  Wound was then irrigated with antibiotic solution.  Gelfoam was placed  topically for hemostasis and found be good.  Medium Hemovac drain was  left upper spacer.  Wound was then closed in layers with Vicryl suture.  Steri-Strips and sterile dressings were applied.  There were no  complications.  The patient tolerated the procedure well and she returns  for postoperative care.           ______________________________  Kathaleen Maser. Pool, M.D.     HAP/MEDQ  D:  05/21/2008  T:  05/22/2008  Job:  [id number]

## 2011-02-05 NOTE — Assessment & Plan Note (Signed)
[facility]                               LIPID CLINIC NOTE   NAME:[name], [name]                     MRN:          [id number]  DATE:01/23/2007                            DOB:          [birth year]    TELEPHONE CALL NOTE:  Time of call:  Jan 23, 2007 at 9:30 in the morning.   HISTORY OF PRESENT ILLNESS:  [name] is due for follow up  associated with her hypercholesterolemia labs.  She has been compliant  with Lipitor every other day.  She has cut her soda's back to less than  one each day, and she has been going to the [facility].  She notes about a 6  pound weight loss.  Her labs have been improved significantly with a  notable LDL reduction down to 116, DL is still below goal at 32.9,  triglycerides are down to 140.  LFT's are within normal limits.  I have  congratulated [name] regarding her blood work results.  I have  asked her to follow up with me in one month.  That appointment is  scheduled for June 12 at 2:30 in the afternoon.  The patient states that  she will call in the meantime with problems.  I have also sent a  prescription for every other day Lipitor, quantity of 90 tablets one  daily or as directed and Lipitor 20.      Shelby Dubin, PharmD, BCPS, CPP       Rollene Rotunda, MD, [facility]    MP/MedQ  DD: 01/23/2007  DT: 01/23/2007  Job #: [id number]

## 2011-02-05 NOTE — Assessment & Plan Note (Signed)
[facility]                               LIPID CLINIC NOTE   NAME:[name], [name]                     MRN:          [id number]  DATE:08/18/2006                            DOB:          [birth year]    Thursday, August 18, 2006   PAST MEDICAL HISTORY:  1. Hypertension.  2. Hyperlipidemia.  3. Gastroesophageal reflux disease.  4. Anxiety.   MEDICATIONS:  1. Lisinopril 20 mg daily.  2. Multivitamin daily.  3. Alprazolam 0.25 mg at bedtime.  4. Prilosec 20 mg daily.  5. Etodolac 400 mg daily.   VITAL SIGNS:  Weight 192 pounds, blood pressure 110/70, heart rate 70.   LABORATORY DATA:  Total cholesterol 264, triglycerides 150, HDL 28, LDL  200.  LFTs within normal limits.  TSH 2.91.  Fasting CBG 109, BUN 22,  creatinine 1.  Hemoglobin A1c 5.5.   ASSESSMENT:  [name] is a very pleasant, soft spoken 65 year old  woman who comes to the lipid clinic today referred by primary care  physician, Dr. Kriste Basque.  She has hyperlipidemia and has had many  intolerances to medications in the past.  She had abdominal pain with  Vytorin but at the same time, also noted colon polyps which were  removed, so I feel like this may have been coincidental; however, she is  very hesitant to retry Vytorin.  She has had myalgias with Pravachol and  Crestor in the past.  She does suffer from fibromyalgia which she says  that these two medications made that worse.  Doses are unknown.  She is  not doing any exercise since July.  Prior to that, she had been swimming  laps every day at the pool, however, this is a very slow lap swim, more  of a slow paddle and kick versus actual aerobic exercise.  Her diet is  fairly high in carbohydrates.  She drinks 6-8 Dr. Alcus Dad a day, these  are anywhere from 16-20 ounces.  She drinks very little water or other  fluids.  Her meals are very high in carbohydrates, very little meat or  protein and some vegetables; however, most of these  are in a casserole  or with lots of butter or fried.  We spent a lengthy time discussing the  benefits of lifestyle modification, however, this will be a challenge  giving that she is having total knee replacement in December of this  year, so she is hesitant to restart any exercise program and to make any  huge changes in her diet given that she would not be able to follow  through on these after her knee surgery for quite some time until she is  finished with rehab.  I have encouraged her to restart either swimming  or some type of exercise after her knee replacement as much as  tolerated.  I have also encouraged her to decrease the amount of Dr.  Alcus Dad that she is having a day, not take them away entirely but cut  them in half and replace the other half with water.  Also encouraged her  to eat  five small meals a day and try to incorporate low amounts of  carbohydrate with small amounts of protein and healthy vegetables  whether it be fresh or cooked as long as they are not fried or in  casseroles.  She does seem to be very willing to begin this lifestyle  modification.  She says in the past, she was watching her diet and being  healthy and had lost 50 pounds but then her mother, brother and sister-  in-law all became sick and subsequently passed within the last year.  She does say that her husband is an alcoholic, although he currently is  in [place] but she says that this does cause stress in her life and makes it  very challenging for her to not being an enabler and to take care of  herself and not try to take care of him.   PLAN:  1. Begin Zetia 10 mg a day. Samples were given and prescription if she      tolerates.  2. Decrease sodas and add water.  3. Decrease carbohydrate and add protein and vegetables to five small      meals a day.  4. Address exercise and continue progress in lifestyle modification      after her total knee.  5. Will have a follow-up appointment in four  months for lipid panel      and LFTs and make adjustments at that time.      Leota Sauers, PharmD  Electronically Signed      Jesse Sans. Daleen Squibb, MD, [facility]  Electronically Signed   LC/MedQ  DD: 08/18/2006  DT: 08/19/2006  Job #: [id number]

## 2011-02-05 NOTE — Op Note (Signed)
[place]  Patient:    [name], [name] Visit Number: [id number] MRN: [id number]          Service Type: DSU Location: 3000 [id number] [id number] Attending Physician:  Donn Pierini Dictated by:   Julio Sicks, M.D. Proc. Date: 08/23/01 Admit Date:  08/23/2001                             Operative Report  PREOPERATIVE DIAGNOSIS:  Right L4-5 stenosis with radiculopathy.  POSTOPERATIVE DIAGNOSIS:   Right L4-5 stenosis with radiculopathy.  PROCEDURE:  Right L4-5 decompressive laminotomy with foraminotomy.  SURGEON:  Julio Sicks, M.D.  ASSISTANT:  Donalee Citrin, Montez Hageman., M.D.  ANESTHESIA:  General endotracheal.  INDICATIONS:  Ms. Wilms is a 65 year old female with history of right lower extremity pain, paresthesias, and weakness consistent with a right-sided L5 radiculopathy, which has failed conservative management.  MRI scanning demonstrated evidence of a lateral recess stenosis on the right side at L4-5 with compression of the right-sided L5 nerve root.  The remainder of the lumbar spine looked quite healthy.  She does have some coexistent lumbar epidural lipomatosis ventrally.  The patient has failed conservative management.  We discussed options available for treatment including the possibility of undergoing a right-sided L4-5 decompressive laminotomy with foraminotomy.  The patient was aware of the risks and benefits and wished to proceed.  DESCRIPTION OF PROCEDURE:  The patient was taken to the operating room and placed on operating table in a supine position.  After adequate level of anesthesia achieved, the patient was turned prone onto a Wilson frame, and appropriately padded.  The patients lumbar region was prepped and draped sterilely.  A #10-blade was used to make a linear skin incision overlying the L4-5 interspace.  This was carried down sharply in the midline.  Subperiosteal dissection was performed on the right side exposing the lamina and  facet joints.  Deep self-retaining retractor was placed.  Intraoperative x-ray was taken and the level was confirmed.  A laminotomy was then performed using a high-speed drill and Kerrison rongeurs to remove the inferior one-third of the lamina of L4, the medial edge of the L4-5 facet joint, superior rim of the L5 lamina.  Ligamentum flavum was then elevated and resected in a piecemeal fashion using Kerrison rongeurs.  The underlying thecal sac was identified. There was a large amount dorsal epidural fat, which was resected with Kerrison rongeurs and the suction.  The thecal sac was pushed down ventrally and fat from the contralateral side was also resected.  Microscope was brought into the field and used for microdissection around the right-sided L5 nerve root. A wide decompressive foraminotomy was performed along the right side of L5 nerve root.  Thecal sac and L5 nerve root were mobilized and retracted towards the midline.  The epidural venous plexus was coagulated and cut.  The disk space was examined.  There was no evidence of acute disk herniation or any significant disk bulge adding to the compression.  Wound was then copiously irrigated with antibiotic solution, Gelfoam was placed topically for hemostasis, which was found to be good.  The microscope and retractor system were removed.  Hemostasis in the muscle was achieved with electrocautery.  The wound was then closed in layers with Vicryl sutures.  Steri-Strips and sterile dressing were applied.  There were no apparent complications.  The patient tolerated the procedure well and she returned to recovery room postoperatively. Dictated by:  Julio Sicks, M.D. Attending Physician:  Donn Pierini DD:  08/23/01 TD:  08/23/01 Job: [id number]

## 2011-02-05 NOTE — Discharge Summary (Signed)
NAMECHESNI, VOS              ACCOUNT NO.:  [id number]   MEDICAL RECORD NO.:  [id number]          PATIENT TYPE:  INP   LOCATION:  3001                         FACILITY:  [facility]   PHYSICIAN:  Kathaleen Maser. Pool, M.D.    DATE OF BIRTH:  September 12, 1946   DATE OF ADMISSION:  05/21/2008  DATE OF DISCHARGE:  05/24/2008                               DISCHARGE SUMMARY   FINAL DIAGNOSES:  L4-5 degenerative disk disease with synovial cyst and  degenerative spondylolisthesis with stenosis.   HISTORY OF PRESENT ILLNESS:  [name] is a 65 year old female with  history of back and bilateral lower extremity pain who has failed  conservative management.  Workup demonstrates evidence of marked  stenosis at L4-5 secondary to a degenerative spondylolisthesis with  associated synovial cyst.  The patient presents now for decompression  and fusion with instrumentation.   HOSPITAL COURSE:  The patient was taken to the operating room where an  uncomplicated L4-5 decompression and fusion with instrumentation was  performed.  Postoperatively, the patient did well.  Back and lower  extremity then was improved postoperatively.  She had no neurological  deficits postoperatively.  Wound is healing well.  She was then  mobilized with the aid of physical and occupational therapy.  At the  time of discharge, she was ambulating without difficulty.  She is to  remain on a lumbar brace.  She will follow up in my office in 1 week.  Condition on discharge is improved.           ______________________________  Kathaleen Maser Pool, M.D.     HAP/MEDQ  D:  06/26/2008  T:  06/26/2008  Job:  [id number]

## 2011-02-05 NOTE — Op Note (Signed)
[name], [name]              ACCOUNT NO.:  [id number]   MEDICAL RECORD NO.:  [id number]          PATIENT TYPE:  INP   LOCATION:  1503                         [facility]AC[facility]   PHYSICIAN:  Jene Every, M.D.    DATE OF BIRTH:  1946-06-18   DATE OF PROCEDURE:  09/14/2006  DATE OF DISCHARGE:  09/18/2006                               OPERATIVE REPORT   PREOPERATIVE DIAGNOSIS:  Degenerative joint disease of the right knee.   POSTOPERATIVE DIAGNOSIS:  Degenerative joint disease of the right knee.   PROCEDURE PERFORMED:  Right total knee arthroplasty.   ANESTHESIA:  General.   ASSISTANT:  Roma Schanz, P.A.   BRIEF HISTORY/INDICATIONS:  This is a 65 year old female who has had  refractory right knee pain following a fracture of the lateral tibial  plateau.  She was treated conservatively.  After persistent pain,  underwent arthroscopic debridement.  Had persistent post arthroscopic  pain, MRI indicating edema and tricompartmental osteoarthrosis.  Operative intervention was indicated for the replacement of degenerative  joint.  The risks and benefits have been discussed, including bleeding,  infection, damage to vascular structures, damage to vascular structures,  suboptimal range of motion, dislocation, need for revision, leg length  discrepancies, etc.   TECHNIQUE:  With the patient in the supine position, after the induction  of adequate general anesthesia with 1 gm of Kefzol, the right lower  extremity was prepped, draped, and exsanguinated in the usual sterile  fashion.  A thigh tourniquet was inflated to 350 mmHg.  The patient's  range of motion revealed 0-130 degrees.  The patient had a slight valgus  posturing to her knee.  The right lower extremity was prepped and draped  in the usual sterile fashion.  A midline incision was made over the  patella to the skin only.  Subcutaneous tissue was dissected.  Electrocautery was utilized to achieve hemostasis.  A median  parapatellar arthrotomy was performed.  Clear synovial fluid was  evacuated.  Patella was everted.  Minimal soft tissue release anteriorly  was performed medially.  After the patella was everted, the knee was  flexed.  Tricompartmental osteoarthrosis was noted, particularly over  the lateral compartment of the patellofemoral joint.  Osteophytes were  removed with a rongeur.  A step drill was utilized to enter the femoral  canal.  This was then irrigated prior to placement of the T-handled  reamer.  A 5 degree right intramedullary guide was placed and pinned.  Then 10 mm was resected from the distal femur utilizing oscillating saw.  Following this, remnants of the medial lateral meniscus as well as the  ACL was removed from the intercondylar notch.  The distal femur was  sized to a 4.  This is using the anterior cortex of the femur.  Then in  bisecting the notch along the intercondylar line, placed pin holes,  bisecting the condyles with slight external rotation.  Following this, a  distal femoral chamfer guide was then applied, impacted on the distal  femur and clamped.  The soft tissue is well protected.  The anterior,  posterior, and chamfer cuts was  then performed.  There was no notching  of the femur that was noted.  A box cut jig was then applied, secured  with pins, and an oscillating saw utilized to perform the box cut.  It  was protected posteriorly with a blade as well.  Following this,  attention was then turned towards the tibia and subluxed with a  retractor.  Residuals of the medial lateral menisci were then excised.  The tibia was sized to a 4.  The oscillating saw utilized to remove the  tibial spine.  Placed an external alignment jig on the tibia just medial  to the tibial tubercle parallel to the tibia.  A 90 degree cut, 10 mm  off the tibial laterally, was performed.  Next, an oscillating saw was  utilized to performed the proximal tibial cut after the tibial cutting  jig  was applied and pinned as well as cross-pinned.  The tibial cutting  jig was then applied, and after the tibial cut was then made and soft  tissues were well protected, I evaluated the flexion and extension gap.  The flexion and extension gap revealed that it did appear to be tight  laterally.  I removed an additional 2 mm off the tibia.  It appeared to  be tight somewhat laterally, both in flexion and extension.  I therefore  performed a sequential lateral release.  I released the iliotibial band,  utilizing the partial release from its insertion and also a pie-crusting  technique.  Also released the popliteus in the posterior capsule.  Following this, it appeared to then balance out fairly well, in terms of  the flexion and extension gap.  I then reflexed the knee and placed the  tibial trial to cover the proximal tibial cut optimally.  This is medial  to the tibial tubercle.  This was then pinned.  The central drill hole  was utilized and then the thin punch.  I then placed a trial femur, a  trial tibia with a post.  It was reduced and found to be stable in full  extension.  It was found to be slight recurvatum in extension and  flexion.  This was the 12 mm.  No instability with varus and valgus  stressing, however.  In flexion, there was slight anterior drawer.  I  tried a 15, and this seemed to be more stable with full flexion and  extension at 140 with no lift-off.  I then sized the patella to be a 28,  small.  This was then clamped and measured, 22 mm in thickness.  We took  10 off of that and drilled PEG holes.  This was done to medialize the  patella.  Flexion and extension showed good tracking of the patella.  I  then removed all components.  Copiously irrigated with pulsatile lavage.  Inspection revealed no evidence of residual osteophytes, residual  menisci.  Following the irrigation, we flexed it, dried all of the  surfaces, and mixed cement posteriorly on a table and then  injected into the proximal tibia, impacted the proximal tibia component.  Redundant  cement removed.  Impacted the femoral component.  Redundant cement  removed.  Cement was placed on the runners.  Placed a 15 insert.  Reduced the knee and held it in extension with axial load applied to the  curing of the cement.  We also clamped in the patellar component and  redundant cement removed.  After the appropriate curing of the cement,  removed  the clamps, over-redundant cement removed.  A trial insert was  removed.  Residual cement removed.  Copiously irrigated it.  I placed a  permanent 15 insert.  She had full extension, flexion at 130, no lift  off.  Good stability and flexion.  We had clamped the patellar ligament  with two towel clips.  There was good patellofemoral tracking.  No  lateral release was required.  Following this, good stability of varus  and valgus stressing to 0-30 degrees.  Placed a Hemovac and brought it  out through a lateral stab wound in the skin.  I prepared a patellar  arthrotomy in slight 30 degrees of flexion with #1 Vicryl interrupted  figure-of-eight sutures.  Subcutaneous tissues were reapproximated with  0 and 2-0 Vicryl simple sutures.  The skin was reapproximated with  staples, full extension, and good flexion at 130 degrees.  The wound was  dressed sterilely.  The tourniquet was deflated, and there was adequate  vascularization of the lower extremity appreciated.   Patient tolerated the procedure well with no complications.  Tourniquet  time was two hours.      Jene Every, M.D.  Electronically Signed     JB/MEDQ  D:  09/27/2006  T:  09/27/2006  Job:  [id number]

## 2011-02-05 NOTE — Op Note (Signed)
[name], [name]              ACCOUNT NO.:  [id number]   MEDICAL RECORD NO.:  [id number]          PATIENT TYPE:  AMB   LOCATION:  [facility]                         FACILITY:  [facility]   PHYSICIAN:  Jene Every, M.D.    DATE OF BIRTH:  09/02/1946   DATE OF PROCEDURE:  01/13/2006  DATE OF DISCHARGE:                                 OPERATIVE REPORT   PREOPERATIVE DIAGNOSES:  Lateral meniscus tear post traumatic chondral  malacia.   POSTOPERATIVE DIAGNOSES:  Lateral meniscus tear post traumatic chondral  malacia, grade 3 chondromalacia of the patella, hypertrophic synovitis  lateral compartment.   PROCEDURE:  Right knee arthroscopy, partial lateral meniscectomy,  chondroplasty of the lateral tibial plateau and chondroplasty of the  patella.   ANESTHESIA:  General and also debridement of hypertrophic synovitis.   INDICATIONS FOR PROCEDURE:  This is a 65 year old status post a lateral  tibial plateau fracture about 5 months ago. The patient had pain in the  lateral compartment that persisted despite radiographs indicating healing of  the fracture. MRI though indicated persistent edema over the tibial plateau.  Though I expected that for a full year after the fracture. It indicated she  most likely had posttraumatic chondromalacia of meniscus tear if she had  good relief for 11 days from a corticosteroid injection suggesting  intraarticular involvement. Operative intervention was indicated for  diagnosis and treatment. The risks and benefits were discussed including  bleeding, infection, injuries to neurovascular structures, no change in  symptoms, worsening in symptoms, need for repeat debridement, etc.   TECHNIQUE:  The patient in the supine position after the induction of  adequate general anesthesia and 1 gram of Kefzol, the right lower extremity  was prepped and draped in the usual sterile fashion. A lateral parapatellar  portal and a superior medial parapatellar portal was  fashioned with a #11  blade. An Ingress cannula was atraumatically placed, irrigant was utilized  to insufflate the joint. Under direct visualization, a medial parapatellar  portal was fashioned with a #11 blade after localization with 18 gauge  needles sparing the medial meniscus. Noted was hypertrophic synovitis in the  lateral compartment, some chronic hemosiderin deposit on the ACL and into  the anterior aspect of the lateral compartment. A shaver was introduced and  utilized to perform a partial synovectomy here. The ACL however was intact.  I examined the lateral compartment and the femoral condyle was unremarkable.  There was a small radial tear of the lateral meniscus overlying the area  with a slight depression which appeared to be the area of the previous  fracture. I introduced a straight basket and utilized it to perform a  partial lateral meniscectomy to a stable base and approximately a third of  the inner third of the meniscus was resected. Underneath there were some  grade 3 changes. I probed that and there was some slight indentation there.  This was in the middle of the plateau. I introduced a 3.5 shaver and  debrided this as well. There was no bare bone however. I extensively probed  the meniscus, there was no evidence of  instability of the meniscus. The  remainder of the meniscus appeared to be intact other than the popliteal  hiatus. I examined the top and the bottom of the meniscus evaluating that,  there was no other pathology. I evaluated the tibial plateau from posterior  to anterior along the rim and throughout the remainder of the plateau. There  was no loose osteochondral fragments or anything of that nature. The knee  was copiously flushed. I did perform a chondroplasty of the patella because  of the extensive grade 3 changes, there was no patellofemoral tracking, the  gutters were unremarkable. The medial compartment was essentially  unremarkable with good  tibial plateau, medial meniscus and femoral condyle  stable to propalpation, did shave some of the fraying of the medial meniscus  however. Next, flexed and extended the knee, there was no interposition  noted, there was no instability with varus/valgus stressing. Next, all  instrumentation was removed, portals were closed with 4-0 nylon simple  suture. A 0.25% Marcaine with epinephrine was infiltrated in the joint, the  wound was dressed sterilely. She was awoken without difficulty and  transported to the recovery room in satisfactory condition.   The patient tolerated the procedure well with no complications.      Jene Every, M.D.  Electronically Signed     JB/MEDQ  D:  01/13/2006  T:  01/13/2006  Job:  [id number]

## 2011-02-05 NOTE — Assessment & Plan Note (Signed)
[facility]                               LIPID CLINIC NOTE   NAME:[name], [name]                     MRN:          [id number]  DATE:01/02/2007                            DOB:          Mar 02, 1946    [name] is seen back in the lipid clinic for further evaluation,  medication titration associated with her hyperlipidemia in the setting  of multiple medication intolerances.  The patient states that she has  not started taking her Lipitor 20 mg 1 time each week, because she quite  knew her cholesterol panel would be better.  She has gone back after her  total knee replacement to drinking 8 to 9 sodas each day, 16 oz each.  She eats very few foods during the day.  She does avoid fried foods, but  does not eat breakfast, has cheese with eggs maybe 4 times a week, and  has soup or other light dinner fare.  She has bicycled 30 minutes a day  3-4 days a week.  She walks around the pool 6 times at [facility].  She  also does leg lifts and curls.   MEDICATION HISTORY:  Includes Vytorin, which yields stomach pains,  muscle and joint pain.  Crestor, which yielded joint pain and lethargy.  Lipitor had an unknown reaction.  The patient does query if it would be  possible for her to take Zocor, as her husband was involved in the  [employer] study, and has lots of Zocor left at home.   CURRENT MEDICATIONS:  1. Lisinopril 20 mg daily.  2. Alprazolam 0.25 mg daily at bedtime.  3. Prilosec 20 mg daily.  4. Mobic 7.5 mg twice daily.  5. Zyrtec as needed 10 mg.   DRUG ALLERGIES:  Include __________.   SOCIAL HISTORY:  The patient has never smoked tobacco and does not drink  alcohol on a regular basis.  She has mentioned that her husband does  have underlying alcoholism, and becomes quite emotional when discussing  that.   PHYSICAL EXAMINATION:  Weight today is 183 pounds, blood pressure is  150/90, heart rate is 72.   PAST MEDICAL HISTORY:  Pertinent for  recent knee surgery,  hyperlipidemia, fibromyalgia.   LABORATORY DATA:  December 28, 2006 revealed normal LFTs.  Total cholesterol  288, triglycerides 218, HDL 32.3.  LDL 222.2.   ASSESSMENT:  The patient is not at goal.  Her lipid panel has not  improved and has actually worsened.  We need to find ways to motivate  this patient and keep her on task, improving her cholesterol panel to  reduce long term problems.   PLAN:  The patient has agreed to do the following:  1. Diet therapy.  The patient will decrease her sodas gradually to      none by April 26, and will replace that with water and substitute      fruits and vegetables.  2. Exercise therapy.  The patient will continue her current exercise      regimen, work to continue to improve her rehab efforts on her knee.  3. Medication therapy.  The patient will begin Lipitor 20 mg every      other day.  Samples have been given to facilitate compliance.  4. Followup.  Patient will have labs checked in 2 weeks and a visit in      3 weeks.  5. I have discussed with the patient that I do not believe that her      husband's Zocor is an appropriate agent, as I do not know that it      is not a placebo, and also she has had problems with Zocor in the      past.      Shelby Dubin, PharmD, BCPS, CPP  Electronically Signed      Rollene Rotunda, MD, [facility]  Electronically Signed   MP/MedQ  DD: 01/03/2007  DT: 01/03/2007  Job #: [id number]   cc:   Lonzo Cloud. Kriste Basque, MD  Jene Every, M.D.

## 2011-02-05 NOTE — Discharge Summary (Signed)
[name], [name]              ACCOUNT NO.:  [id number]   MEDICAL RECORD NO.:  [id number]           PATIENT TYPE:   LOCATION:                                 FACILITY:   PHYSICIAN:  Jene Every, M.D.    DATE OF BIRTH:  [birth year]   DATE OF ADMISSION:  09/14/2006  DATE OF DISCHARGE:  09/18/2006                               DISCHARGE SUMMARY   ADMISSION DIAGNOSES:  Includes:  1. Degenerative joint disease to right knee.  2. Hypertension.  3. Anxiety.  4. Gastroesophageal reflux disease.  5. Hypercholesterolemia.   DISCHARGE DIAGNOSES:  1. Status post right total knee arthroplasty.  2. Degenerative joint disease to right knee.  3. Hypertension.  4. Anxiety.  5. Gastroesophageal reflux disease.  6. Hypercholesterolemia.   CONSULTS:  PT, OT, case management.   PROCEDURES:  The patient was taken to the operating room on September 14, 2006, to undergo a right total knee arthroplasty.  Surgeon, Dr. Jene Every.  Assistant, Roma Schanz, PAC.  Anesthesia, general.  Complications, none.   HISTORY:  [name] is a pleasant, 65 year old female who initially  presented to [place] with a twisting injury to her knee.  She was seen at  [facility] at that time and diagnosed with a lateral  tibial plateau fracture, nondisplaced.  Further MRI of this, again, did  show injury to the tibial plateau.  This healed without significant  incident but the patient had persistent pain.  She underwent  conservative treatment and cortisone injection.  She also had  arthroscopic debridement which worked temporarily for her symptoms.  Unfortunately, she had persistent disabling symptoms.  It is felt at  this point she would benefit from a total knee arthroplasty.  The risks  and benefits of this surgery were discussed.  She did elect to proceed.   LABORATORY DATA:  Preoperative CBC shows a white cell count of 5.3,  hemoglobin 13.1, hematocrit 38.8.  These were followed  throughout the  hospital course.  White cell count did elevate to a level of 12.5,  however, it resolved by the time of discharge at a level of 8.1.  Hemoglobin did drop to 9.2 and hematocrit 26.4, however, she was  asymptomatic from this.  Coagulation studies done preoperatively showed  PT 12.4, INR 0.9, PTT of 31.  She was placed on Coumadin  postoperatively.  Coagulation studies were followed.  At the time of  discharge, she was therapeutic on her Coumadin with an INR of 2.2.  Routine chemistries done preoperatively shows a sodium of 146, potassium  5.1, elevated glucose of 114 with a normal BUN and creatinine.  These  were followed throughout the hospital course.  Sodium and potassium  remained normal.  Glucose remained slightly elevated, at the time of  discharge 130.  Renal function remained normal.  Urinalysis done  preoperatively was negative.  I do not see an EKG or chest x-ray in the  chart.   HOSPITAL COURSE:  The patient was admitted, taken to the OR, and  underwent the above-stated procedure without difficulty.  One Hemovac  drain was placed intraoperatively.  The patient was then transferred to  the PACU and then to the orthopedic floor for postoperative care.  She  was placed on Coumadin postoperatively for DVT prophylaxis as well as  PCA for pain relief.  Postoperative day #1, the patient did note some  episodes of nausea.  She did have some decrease in O2 saturation  overnight.  This, of course, was secondary to her narcotics as well as  her history of sleep apnea.  Hemovac was draining quite a bit.  It was  left in place.  Foley was being held.  Coumadin was continued per  pharmacy.  We discontinued her PCA, changed her to p.o. medication.  On  postoperative #2, the patient continued to do fairly well.  Hemovac was  discontinued with the tip intact.  The dressing was change.  She had a  slight drop in her hemoglobin level to 9.3, however, was asymptomatic  from this.   INR was 1.8.  She continued to advance slowly with her  therapy.  On postoperative day #3, she did not feel she was quite ready  to go home.  Labs remained stable.  The incision was clean and dry.  She  had no episodes of nausea.  She was voiding, passing gas without  difficulty.  On postoperative day #4, it was felt the patient was stable  to be discharged home.   DISPOSITION:  The patient is stable to be discharged home with home  health, PT, OT.   DIET:  Advance as tolerated.   ACTIVITY:  She is to walk with a knee immobilizer until she can straight-  leg raise x10.  Total knee precautions.   WOUND CARE:  She is to change her dressing daily.  Call if any fever or  chills develop or increasing erythema.  She is to elevate and ice her  leg 6 times a day for 20 minutes at a time.   DISCHARGE MEDICATIONS:  Include:  1. Vicodin 1-2 p.o. q. 4-6 p.r.n. pain.  2. Coumadin as dosed per pharmacy.  3. Vitamin C 500 mg daily.   Again, she is to follow up with Dr. Shelle Iron in approximately 10-14 days,  call for an appointment.   CONDITION ON DISCHARGE:  Stable.   FINAL DIAGNOSES:  Status post right total knee arthroplasty.      Roma Schanz, P.A.      Jene Every, M.D.  Electronically Signed    CS/MEDQ  D:  10/06/2006  T:  10/06/2006  Job:  [id number]

## 2011-02-05 NOTE — Cardiovascular Report (Signed)
[place]  Patient:    [name], [name] Visit Number: [id number] MRN: [id number]          Service Type: CAT Location: [facility] 28[address] 17 Attending Physician:  Daisey Must Dictated by:   Daisey Must, M.D. [facility] Proc. Date: 04/10/02 Admit Date:  04/10/2002 Discharge Date: 04/10/2002   CC:         Lorin Picket M. Kriste Basque, M.D. [facility]  Luis Abed, M.D. [facility]  Cardiac Catheterization Laboratory   Cardiac Catheterization  PROCEDURES PERFORMED: Left heart catheterization with coronary angiography and left ventriculography.  INDICATIONS: The patient is a 64 year old woman who has been having chest pain She had previously had an abnormal Cardiolite which was suggestive of anterior apical ischemia. She was therefore referred for cardiac catheterization.  DESCRIPTION OF PROCEDURE: A 6 French sheath was placed in the right femoral artery.  Standard Judkins 6 French catheters were utilized.  Contrast was Omnipaque.  The patient had mild catheter-induced spasm in the proximal right coronary artery, which was relieved with intracoronary nitroglycerin. There were no complications.  RESULTS:  HEMODYNAMICS: Left ventricular pressure 136/4, aortic pressure 148/80.  There was no aortic valve gradient.  LEFT VENTRICULOGRAM: Wall motion is normal. Ejection fraction estimated at greater than or equal to 65%. There is no mitral regurgitation.  CORONARY ARTERIOGRAPHY: (Right dominant).  Left main: Normal.  Left anterior descending: The left anterior descending artery gives rise to a normal sized first diagonal and small second diagonal. The LAD is normal.  Left circumflex: The left circumflex coronary gives rise to a normal sized OM-1, small OM-2 and a normal sized OM-3. The left circumflex is normal.  Right coronary artery: The right coronary artery is a dominant vessel. There is mild catheter-induced spasm in the proximal vessel, which was relieved  with intracoronary nitroglycerin. The right coronary artery was otherwise angiographically normal. The distal right coronary gives rise to a normal sized posterior descending artery followed by a normal sized first posterolateral branch and a small second posterolateral branch.  IMPRESSIONS: 1. Normal left ventricular systolic function. 2. Angiographically normal coronary arteries. Dictated by:   Daisey Must, M.D. [facility] Attending Physician:  Daisey Must DD:  04/10/02 TD:  04/14/02 Job: [id number]

## 2011-02-05 NOTE — H&P (Signed)
[name], [name]              ACCOUNT NO.:  [id number]   MEDICAL RECORD NO.:  [id number]          PATIENT TYPE:  INP   LOCATION:  1503                         FACILITY:  [facility]   PHYSICIAN:  Jene Every, M.D.    DATE OF BIRTH:  1946/04/06   DATE OF ADMISSION:  09/14/2006  DATE OF DISCHARGE:                              HISTORY & PHYSICAL   CHIEF COMPLAINT:  Right knee pain.   HISTORY AND PHYSICAL:  This is a pleasant 65 year old female who  initially presented to [place] with onset of knee pain following a twisting  injury.  She was seen at __________ at the time, diagnosed with a  lateral tibial plateau fracture, nondisplaced.  The MRI following her  injury did show evidence of a lateral tibial plateau fracture,  nondisplaced.  No evidence of collateral ligament or cruciate ligament  tears were noted.  The patient's fracture healed nicely but she  continued to have persistent knee pain.  She was initially treated  conservatively with cortisone injections with minimal relief.  She then  underwent arthroscopic debridement with short-term relief of her  symptoms.  At the time of her arthroscopy, there was lateral meniscal  tear with posttraumatic chondromalacia of the right knee noted.  Fortunately, following the arthroscopy, she did well for some time, but  then had recurrent pain.  We proceeded with Hyalgan series without  significant relief.  Unfortunately, the patient's pain progressed to be  very disabling, so, at that point, she was admitted for a total knee  arthroplasty.  Risks and benefits of the surgery were discussed and the  patient has elected to proceed.   PAST MEDICAL HISTORY:  Significant for:  1. Hypertension.  2. Anxiety.  3. Gastroesophageal reflux disease.  4. Hypercholesterolemia.   MEDICATIONS:  1. Lisinopril 20 mg 1 p.o. daily.  2. Alprazolam 0.25 mg 2 p.o. q.h.s.  3. __________ 20 mg 1 p.o. daily.  4. Mobic 7.5 mg 1 p.o. b.i.d.  5. Vicodin p.r.n.  6.  Lipitor 20 mg once a week.   ALLERGIES:  MEPERGAN FORTIS cardiac arrest.  MORPHINE nausea.  PERCOCET  nausea.  __________.   PAST SURGICAL HISTORY:  1. Right knee arthroscopy 2007.  2. Left knee arthroscopy.  3. Hysterectomy.  4. Cholecystectomy.  5. Lumbar decompression.  6. Right eye surgery.   SOCIAL HISTORY:  The patient is married.  She is retired.  History  negative for tobacco or alcohol.  No kids.  She has 4 steps leading into  her home.  Primary care physician is Dr. Kriste Basque.   FAMILY HISTORY:  Father has history of coronary artery disease,  hypertension, as well as prostate melanoma cancer.  Mother has history  of hypertension as well as sisters and brothers.  Both parents have  history of osteoarthritis.   REVIEW OF SYSTEMS:  GENERAL:  The patient denies any fevers, chills,  night sweats, or bleeding tendencies.  HEENT:  No blurry or double  vision, seizure, headache or paralysis.  RESPIRATORY:  No shortness of  breath, productive cough, or hemoptysis.  CARDIOVASCULAR:  No chest  pain, angina,  or orthopnea.  GU:  No dysuria, hematuria, or discharge.  GI:  No nausea, vomiting, diarrhea, constipation, melena, or bloody  stools.  MUSCULOSKELETAL:  As per HPI.   PHYSICAL EXAMINATION:  VITAL SIGNS:  Pulse is 70, respiratory 16, blood  pressure 140/105 left arm.  GENERAL:  This is a well-developed, well-nourished female who is anxious  in nature.  She does walk with antalgic gait.  HEENT:  Atraumatic, normocephalic.  Pupils equal, round, and reactive to  light.  EOM's intact.  NECK:  Supple.  No lymphadenopathy.  CHEST:  Clear to auscultation bilaterally.  No rhonchi, wheezes, or  rales.  HEART:  Regular rate and rhythm.  No murmurs, gallops, or rubs.  ABDOMEN:  Soft, nontender, nondistended.  Bowel sounds times 4.  SKIN:  No rashes or lesions noted.  EXTREMITIES:  The patient is tender along the medial and lateral joint  line of the right knee.  Range of motion 0 to  130.  She has pain with  patellar compression.   IMPRESSION:  Degenerative joint disease right knee.   PLAN:  The patient will be admitted to [facility] and undergo  right total knee arthroplasty.      [name], P.A.      Jene Every, M.D.  Electronically Signed    CS/MEDQ  D:  09/14/2006  T:  09/14/2006  Job:  [id number]

## 2011-02-24 ENCOUNTER — Telehealth: Payer: Self-pay | Admitting: Pulmonary Disease

## 2011-02-25 ENCOUNTER — Other Ambulatory Visit: Payer: Self-pay | Admitting: Internal Medicine

## 2011-02-25 DIAGNOSIS — Z79899 Other long term (current) drug therapy: Secondary | ICD-10-CM

## 2011-02-25 DIAGNOSIS — E7800 Pure hypercholesterolemia, unspecified: Secondary | ICD-10-CM

## 2011-02-25 NOTE — Telephone Encounter (Signed)
Called spoke with [place] who requested that our office place the order for pt's lipid and hepatic into the system as "future labs."  However, we discovered that this office did not order the labs so sharonda will contact the nurse at cards to place the order.  Will sign off on message.

## 2011-02-26 ENCOUNTER — Other Ambulatory Visit: Payer: Self-pay | Admitting: Internal Medicine

## 2011-02-26 ENCOUNTER — Other Ambulatory Visit (INDEPENDENT_AMBULATORY_CARE_PROVIDER_SITE_OTHER): Payer: 59 | Admitting: *Deleted

## 2011-02-26 DIAGNOSIS — E7800 Pure hypercholesterolemia, unspecified: Secondary | ICD-10-CM

## 2011-02-26 DIAGNOSIS — Z79899 Other long term (current) drug therapy: Secondary | ICD-10-CM

## 2011-02-26 LAB — LIPID PANEL
Cholesterol: 160 mg/dL (ref 0–200)
HDL: 31.3 mg/dL — ABNORMAL LOW (ref >39.00)
Triglycerides: 226 mg/dL — ABNORMAL HIGH (ref 0.0–149.0)

## 2011-02-26 LAB — HEPATIC FUNCTION PANEL
ALT: 15 U/L (ref 0–35)
Albumin: 4.2 g/dL (ref 3.5–5.2)
Bilirubin, Direct: 0.1 mg/dL (ref 0.0–0.3)
Total Protein: 7.4 g/dL (ref 6.0–8.3)

## 2011-02-26 LAB — LDL CHOLESTEROL, DIRECT: Direct LDL: 91.5 mg/dL

## 2011-03-03 ENCOUNTER — Other Ambulatory Visit: Payer: 59 | Admitting: *Deleted

## 2011-03-08 ENCOUNTER — Ambulatory Visit (INDEPENDENT_AMBULATORY_CARE_PROVIDER_SITE_OTHER): Payer: 59

## 2011-03-08 VITALS — Wt 171.4 lb

## 2011-03-08 DIAGNOSIS — E7800 Pure hypercholesterolemia, unspecified: Secondary | ICD-10-CM

## 2011-03-08 NOTE — Patient Instructions (Signed)
Great job on your [employer] loss!   Continue your simvastatin and fish oil  Continue your vegan diet.  Try to eat more flax seed oil if you can  Continue to exercise daily  We will check a lipoprofile at your next visit.

## 2011-03-09 NOTE — Progress Notes (Signed)
HPI     Mrs. Corkern presents to the clinic today for dyslipidemia follow-up. She is currently on Simvastatin 40 mg daily and Fish oil 4 grams daily. She is compliant with her medications and does not have any pain related to her simvastatin.  She does have some chronic pain related to fibromyalgia.  She did mentioned talking with a Psychologist, counselling recently who suggested she stop simvastatin and start a new medication to treat her elevated TG rather than LDL and wanted to know my opinion.       Pt has switched to an almost completely vegan diet.  Her husband is with her today and states they try to stay away from all animal products.  He is trying to eat only raw foods, but Mrs. Beedy is still eating some cooked foods and animal products.  She likes to snack on shredded wheat cereal and grapes.  She has lost 15 lbs since her last visit here in February.  She does not drink any alcohol.        Pt continues going to the [facility] daily. She bikes ~5-8 miles on a recumbent bike almost daily.

## 2011-03-09 NOTE — Assessment & Plan Note (Signed)
Pt's cholesterol increased slightly since February.  TC- 160 (goal<200), TG- 226 (goal<150), HDL-31.3 (goal>45), LDL-91.5 (goal<100).  LFTs are WNL.  Discussed statin versus other therapy.  Would prefer to stay on statin at this time given LDL increased ~30 pts since last check.  Would not add fenofibrate to help TG at this time given pt's history of fibromyalgias.  Will continue fish oil and encouraged continued lifestyle improvements.  Will check lipopanel at next visit to determine LDL-P and see if addition of niacin would be beneficial.   Will f/u in 3 months.

## 2011-03-15 ENCOUNTER — Other Ambulatory Visit: Payer: Self-pay | Admitting: Gynecology

## 2011-03-15 DIAGNOSIS — Z1231 Encounter for screening mammogram for malignant neoplasm of breast: Secondary | ICD-10-CM

## 2011-03-16 ENCOUNTER — Ambulatory Visit (INDEPENDENT_AMBULATORY_CARE_PROVIDER_SITE_OTHER): Payer: 59 | Admitting: Gynecology

## 2011-03-16 DIAGNOSIS — N898 Other specified noninflammatory disorders of vagina: Secondary | ICD-10-CM

## 2011-03-16 DIAGNOSIS — N950 Postmenopausal bleeding: Secondary | ICD-10-CM

## 2011-03-16 DIAGNOSIS — B373 Candidiasis of vulva and vagina: Secondary | ICD-10-CM

## 2011-03-16 DIAGNOSIS — Z1211 Encounter for screening for malignant neoplasm of colon: Secondary | ICD-10-CM

## 2011-05-06 ENCOUNTER — Ambulatory Visit: Payer: 59 | Admitting: Gynecology

## 2011-05-06 ENCOUNTER — Ambulatory Visit (HOSPITAL_COMMUNITY): Payer: 59

## 2011-05-07 ENCOUNTER — Encounter: Payer: Self-pay | Admitting: Gynecology

## 2011-05-07 ENCOUNTER — Ambulatory Visit (INDEPENDENT_AMBULATORY_CARE_PROVIDER_SITE_OTHER): Payer: 59 | Admitting: Gynecology

## 2011-05-07 DIAGNOSIS — R82998 Other abnormal findings in urine: Secondary | ICD-10-CM

## 2011-05-07 DIAGNOSIS — B373 Candidiasis of vulva and vagina: Secondary | ICD-10-CM

## 2011-05-07 DIAGNOSIS — R21 Rash and other nonspecific skin eruption: Secondary | ICD-10-CM

## 2011-05-07 DIAGNOSIS — N898 Other specified noninflammatory disorders of vagina: Secondary | ICD-10-CM

## 2011-05-07 DIAGNOSIS — R300 Dysuria: Secondary | ICD-10-CM

## 2011-05-07 DIAGNOSIS — N952 Postmenopausal atrophic vaginitis: Secondary | ICD-10-CM

## 2011-05-07 DIAGNOSIS — A499 Bacterial infection, unspecified: Secondary | ICD-10-CM

## 2011-05-07 DIAGNOSIS — N760 Acute vaginitis: Secondary | ICD-10-CM

## 2011-05-07 DIAGNOSIS — B3731 Acute candidiasis of vulva and vagina: Secondary | ICD-10-CM

## 2011-05-07 DIAGNOSIS — L293 Anogenital pruritus, unspecified: Secondary | ICD-10-CM

## 2011-05-07 DIAGNOSIS — B9689 Other specified bacterial agents as the cause of diseases classified elsewhere: Secondary | ICD-10-CM

## 2011-05-07 MED ORDER — METRONIDAZOLE 500 MG PO TABS: 500.0000 mg | ORAL_TABLET | Freq: Two times a day (BID) | ORAL | Status: DC

## 2011-05-07 MED ORDER — FLUCONAZOLE 200 MG PO TABS: 200.0000 mg | ORAL_TABLET | Freq: Every day | ORAL | Status: DC

## 2011-05-07 NOTE — Progress Notes (Signed)
Patient presents with a several complaints. First is a vaginal discharge with irritation she was treated in June for yeast with Terazol 3 cream. Also notes some passage of gas through her vagina she notes it primarily at nighttime she is lying flat on her back never sustained or hasn't odor she just feels a sensation of moving through her labia.  She is also complaining of a lot of fatigue just generalized lethargy. She noted some red rash areas on her lower extremities and she thought were chiggers and was putting anti-chigger medicine on them.  Exam Pelvic: External BUS vagina was atrophic genital changes weight discharge noted KOH wet prep done. Bimanual without masses or tenderness rectovaginal exam is normal. There is no evidence of fistula, dark staining discharge or fluid. Extremities: Patient does have several patchy red areas on her lower extremities it looks like a mild dermatitis.  Assessment and plan: #1 Vaginal discharge. KOH wet prep is positive for yeast.  I will treat with Diflucan 200 mg tablet 1 daily x2 doses and Flagyl 500 mg twice a day x7 days all points discussed.  She does have listed Diflucan as an allergy but on questioning she never knew of a reaction and I think that this was noted because of her being on simvastatin. I did ask her to stop her simvastatin while taking the Diflucan just to make sure she agrees with this. #2 Air per vagina. I reviewed with the patient and I doubt that she has a fistula without discoloration in the discharge and no odor. Also would be unusual to spontaneously develop a fistula as her hysterectomy or any pelvic surgery were a number of years ago. I think that she's having some air trapping during the day and then when she lays down at night and changes the angle is allowing the air to escape. I did recommend she try putting a tampon in place and see if this doesn't stop the air. There is discoloration on the tampon would certainly imply a fistula. If  the gas continues then it probably is rectal gas this escaping through her labia is giving her that sensation. #3 Rash on her legs. I think that this is a eczema type rash possibly allergic. I recommended 1% hydrocortisone cream. If her symptoms persist she needs to see a dermatologist. #4 Fatigue. Encouraged the patient to see Dr. Hillary Bow who is her primary to be further evaluated for this.  She agrees to see him.

## 2011-05-10 ENCOUNTER — Telehealth: Payer: Self-pay | Admitting: Gynecology

## 2011-05-10 DIAGNOSIS — N390 Urinary tract infection, site not specified: Secondary | ICD-10-CM

## 2011-05-10 MED ORDER — SULFAMETHOXAZOLE-TRIMETHOPRIM 800-160 MG PO TABS: 1.0000 | ORAL_TABLET | Freq: Two times a day (BID) | ORAL | Status: AC

## 2011-05-10 NOTE — Telephone Encounter (Signed)
PT INFORMED WITH THE BELOW NOTE. 

## 2011-05-10 NOTE — Telephone Encounter (Signed)
LM FOR PT TO CALL RE:BELOW NOTE. 

## 2011-05-10 NOTE — Telephone Encounter (Signed)
Tell patient urine consistent with early UTI.  I want to cover her with Septra DS one by mouth twice a day x3 days

## 2011-05-12 ENCOUNTER — Other Ambulatory Visit: Payer: Self-pay | Admitting: *Deleted

## 2011-05-12 MED ORDER — ALPRAZOLAM 0.5 MG PO TABS: 0.5000 mg | ORAL_TABLET | Freq: Three times a day (TID) | ORAL | Status: DC | PRN

## 2011-05-13 ENCOUNTER — Encounter: Payer: Self-pay | Admitting: Adult Health

## 2011-05-13 ENCOUNTER — Ambulatory Visit (HOSPITAL_COMMUNITY)
Admission: RE | Admit: 2011-05-13 | Discharge: 2011-05-13 | Disposition: A | Discharge status: Home / Self Care | Payer: 59 | Source: Ambulatory Visit | Attending: Gynecology | Admitting: Gynecology

## 2011-05-13 ENCOUNTER — Ambulatory Visit (INDEPENDENT_AMBULATORY_CARE_PROVIDER_SITE_OTHER): Payer: 59 | Admitting: Adult Health

## 2011-05-13 DIAGNOSIS — Z1231 Encounter for screening mammogram for malignant neoplasm of breast: Secondary | ICD-10-CM

## 2011-05-13 DIAGNOSIS — R5383 Other fatigue: Secondary | ICD-10-CM

## 2011-05-13 DIAGNOSIS — R5381 Other malaise: Secondary | ICD-10-CM

## 2011-05-13 NOTE — Assessment & Plan Note (Signed)
Chronic fatigue -suspect is multifactoral in nature Recent blood work reviewed with pt.  Pt is on numerous medications that cause sedation, fatigue and low energy esp the pain meds/narcotics/anxiolytics.  She will look at cutting back on pain meds if possible and follow up with pain clinic.  Along with multiple co-mordities w/ possible MGUS that cause fatigue  Advised on healthy sleep, exercise and nuturition to help support healthy lifestyle.

## 2011-05-13 NOTE — Progress Notes (Signed)
Subjective:    Patient ID: [name], female    DOB: [birth year], 65 y.o.   MRN: [id number]  HPI  65 y/o WF here for a follow up visit... she has mult med problems and is well known to [place]... also followed in the Lipid Clinic, and by numerous specialists as noted below...   ~  Jan 26, 2010:  Sleep eval [name] w/ 11/10 study showing AHI= 25 w/ obstructive & central events... CPAP titration was suboptimal & she was tried on BiPAP... she tells me that she stopped all this when she lost weight & husb confirms no snoring or sleep disordered breathing- rests well etc...  she saw [name] for f/u MGUS & rising IgM level> he felt she was stable compared to his old values & rec CT Scans to r/o lymphoma (done 4/11 & neg- no lesions seen, mild hepatomegaly noted)...  she also followed by [name] for Urology w/ IC> on Elmiron orally & in bladder (she does self caths now)...  also followed by [name] in the Pain Center now on referral from Ortho> tried on NUCYNTA ("too expensive")...  also notes improvement in FLP/ lipid clinic f/u on Simva40 & weight loss via diet etc...  ~  May 28, 2010:  states tick bite 8/18 "[name] says it was a deer tic" on upper inner left bicep area... given Doxy x7d per [name] & no systemic symptoms (no HA, fever, change in FM symptoms) but notes rash, itching, sweats, & pain in arm... on exam> no signif rash, sm red spot where she says tick was removed (no resid parts left), no erythema migrans etc... we discussed checking CBC & Lyme titers; Rx w/ Atarax for itching.  ~  July 29, 2010:  states she's doing "so-so"> pain management & [facility]) sent her to [facility]- [name] for another opinion about her knee- notes pending & she requiring Oxycodone 15mg  Tid for elief...  BP is well controlled on meds;  she denies CP & notes rare self-lim palpit, SOB at baseline & too sedentary due to ortho probs;  Lipids stable on Simva40;  GI stable on numerous meds;  & continues  on Alpraz 3/d for nerves...  ~  Jan 27, 2011:  19mo ROV & she is basically stable> still fatigued and now c/o itching, no rash, & we discussed Atarax for Prn use;  She feels she has improved on vegan diet along w/ her husb & [facility];  She is still seeing Pain Management & says "they say I have inflammation in my body";  She recently saw [name] for her 19mo f/u Heme/Onc testing...  ~05/13/2011  Pt presents for work in visit. For complaints of persistent fatigue. She has had an extensive workup for fatigue per Dr. Kriste Basque  And Dr. Myna Hidalgo . She does have several co-morbidities along w/ multiple medications. She takes high dose percocet 3-4 tabs daily along with xanax 1-2 daily . She has low energy and wears out easily . She is open to tapering on lower doses of percocet however concerned on pain control. We discussed in detail. HR is 65 today, explained she on is beta blocker that will cause low HR. She denies chest pain.  Currently being followed by Dr. Myna Hidalgo for possible  MGUS which also causes fatigue.            Problem List:  UNSPECIFIED SLEEP APNEA (ICD-780.57) - Complex sleep apnea - PSG 07/25/09 AHI 28 > ~  Sleep eval  [name] w/ 11/10 study showing AHI= 25 w/ obstructive & central events... CPAP titration was suboptimal & she was tried on BiPAP... she tells me that she stopped all this when she lost weight & husb confirms no snoring or sleep disordered breathing- rests well etc...   ALLERGIC RHINITIS (ICD-477.9) Hx of SINUSITIS (ICD-473.9) - Hx sinusitis followed [name] at [facility]...  HYPERTENSION (ICD-401.9) - now on METOPROLOL 25mg  Bid... she was hosp 3/09 w/ gastroenteritis & BP meds were stopped due to weakness... grad restarted and increased w/ improvement in BP and palpitations... ~  11/11: BP= 122/70 she is INTOL to Diovan; denies HA, visual symptoms, CP, palpit, dizzy, syncope, edema; husb notes that BPs are similar at home ~120/70's... ~  5/12:  BP= 112/70 & she  has the same chr fatigue complaints, but no specific symptoms...  CHEST PAIN (ICD-786.50) - seen by [name] in 2006... ~  2DEcho 11/98 showed norm valves, norm LVF, ? min LVH... ~  cath 7/03 w/ norm coronaries, & norm LVF... ~  NuclearStressTest 2/06 showed no infarct, no ischemia, EF=65%... ~  Myoview repeated 4/10: low risk study w/ soft tissue attenuation, normal wall motion, EF= 78%...  PALPITATIONS, HX OF (ICD-V12.50) - notes occas fluttering and "jello feeling" in her chest... Metoprolol helps.  HYPERCHOLESTEROLEMIA (ICD-272.0) - on ZOCOR 40mg Qhs + Fish Oil > followed in the LC. ~  FLP 01/03/08 on SIMVASTATIN 20mg /d showed TChol 206, TG 182, HDL 31, LDL 145... ~  FLP 6/09 (on Simva40) showed TChol 173, TG 199, HDL 31, LDL 102... continue f/u in lipid clinic. ~  FLP 11/09 (on Simva40) showed TChol 196, TG 265, HDL 32, LDL 119... she stopped lipid clinic. ~  Due to continued "side effects" she placed the Simvastatin on hold, then restarted 40mg /d... ~  FLP 6/10 showed TChol 218, TG 394, HDL 33, LDL 129...  ~  FLP 11/10 showed TChol 262, TG 568, HDL 31, LDL 144... she needs to ret to the Lipid Clinic. ~  FLP 1/11 after 20# wt loss showed TChol 144, TG 181, HDL 43, LDL 65 on Simva40 ~  FLP 10/11 on Simva40+FishOil showed TChol 145, TG 237, HDL 33, LDL 79 ~  FLP 2/12 on Simva40+FishOil showed TChol 126, TG 238, HDL 27, LDL 66  GERD (ICD-530.81) - on PRILOSEC 20mg /d...  ~  prev EGD 8/05 showed 3cmHH, gastric polyp... prev EGD w/ stricture dilated. ~  EGD repeated 12/09 & showed 5cm HH w/ healing ulcer, esophagitis, gastritis, etc... HPylori neg... REC> continue Prilosec, Phenergan. ~  she saw [name], [name] 8/10 for eval of nausea- no help from Reglan, Scopalamine, etc... had UGI/ Ba swallow- sm HH, mild reflux, norm peristalsis;  Gastric Emptying Scan- normal;  EGD 9/10 by [facility] w/ esoph ulcer seen, neg CLO, benign gastric polyp removed, Rx'd PPI. ~  5/12:  She states that she no  longer has any GI issues on her vegan diet...  DIVERTICULOSIS OF COLON (ICD-562.10) - Rx w/ PROCTOSOL HC Cream Prn. COLONIC POLYPS (ICD-211.3) - colonoscopy 8/07 by [name] showed divertics only...  CYSTITIS (ICD-595.9) - she was seen at [facility] for poss interstitial cystitis... prev DMSO Rx. ~  Eval by [name] & pt tells me she is on Elmiron both orally & via bladder (self-cath). ~  9/11:  she reports IC is quiet & off Elmiron Rx now.  DEGENERATIVE JOINT DISEASE (ICD-715.90) - prev Rx MOBIC 7.5mg  Prn, now on OXYCODONE- taking daily for her pain per [name]... she had a right  TKR 12/07 by Tora Perches... then required right Patella surg 10/08 from [name]... now s/p right TKR revision by DrJenna at [facility]- they referred to pain management... ~  9/11: she tells me that [name] is referring her to [facility] for another opinion regarding her knees ([name]- there was nothing they could do & rec Pain Management[facility], & veggie diet).  BACK PAIN, LUMBAR (ICD-724.2) - she had a right L4-5 decompressive laminotomy w/ foraminotomy 12/02 by DrPool... then L4-5 decompression & fusion 9/09 by DrPool due to DDD w/ synovial cyst & degen spondylolithesis w/ stenosis... f/u XRays and MRI 5/10 showed fusion intact, NAD.[name]. ~  CHRONIC PAIN MANAGEMENT> followed by [name] clinic now, on OXYCODONE 20mg  every 4H prn (ave 3/d she says), plus MOBIC 7.5mg /d.  FIBROMYALGIA (ICD-729.1) - she continues to have mult somatic complaints... she has seen DrDeveshwar in the past...  ~  She reports trial of Accupuncture> it helped the FM but not the knee pain  OSTEOPENIA (ICD-733.90) - I cannot locate recent BMD, we will discuss f/u study... VITAMIN D DEFICIENCY (ICD-268.9) -  ~  Vit D level was 5 (30-90) 11/08 & rec to start VitD 50K/wk... ~  Vit D level 11/09 = 45  ~  Vit D level 6/10 = 23 ~  Vit D level 11/10 = 35 ~  Vit D level 5/11 = 70... switched to Vit D 2000u daily.  ANXIETY (ICD-300.00) -  on ALPRAZOLAM 0.5mg  as needed- taking 3 per day recently. DEPRESSION (ICD-311) ** see above **  Hx of PSORIASIS (ICD-696.1)  Hx of UNSPECIFIED DISORDER PLASMA PROTEIN METABOLISM (ICD-273.9) - she has a biclonal gammopathy evaluated 10/05 by [name] who thought it was reactive, perhaps a MGUS variant... ~  f/u labs 11/09 showed Hg= 12.2, TProt= 7.1, SPE/ IEP w/ biclonal IgM kappa protein present accounting for 0.31g/dl of the total 1.61 g/dl of protein in the gamma region & IgM level = 319 mg/dl... ~  labs 11/10 showed Hg= 12.5, TProt= 7.8, SPEP/IEP w/ monoclonal IgM kappa & IgM lamba proteins accounting for 0.44 g/dl of the total 0.96 g/dl in the gamma region... IgM level = 490mg /dl... ~  11/10:  Referred back to [name] who has resumed follow up of this problem... ~  4/11:  [name] did CT Chest/ Abd/ Pelvis> neg w/o adenopathy etc... ~  Labs reviewed in EPIC flow sheet...   Past Surgical History  Procedure Date  . Laminotomy 2010    L-4, L-5 FUSION  . Knee arthroscopy     RIGHT KNEE X 5  . Total knee arthroplasty 12-07 and 4-07    Dr. Shelle Iron, revision 12-09 Dr. Eileen Stanford at [facility]  . Patella fracture surgery 10-08    Dr. Charlann Boxer  . Posterior laminectomy / decompression lumbar spine 9-09    Dr. Jordan Likes  . Cholecystectomy   . Hand surgery     TRIGGER FINGER  . Hernia repair   . Abdominal hysterectomy     for prolapse with abdominal incision  . Laparoscopic oophorectomy 2010    BILATERAL    Outpatient Encounter Prescriptions as of 01/27/2011  Medication Sig Dispense Refill  . ALPRAZolam (XANAX) 0.5 MG tablet Take 0.5 mg by mouth 3 (three) times daily as needed.        . Cholecalciferol (VITAMIN D) 2000 UNITS CAPS Take 1 capsule by mouth daily.        . fish oil-omega-3 fatty acids 1000 MG capsule Take 2 g by mouth 2 (two) times daily.        [name]  hydrocortisone (ANUSOL-HC) 2.5 % rectal cream Place 1 application rectally 2 (two) times daily as needed.        . hydrOXYzine (ATARAX) 25 MG  tablet Take 1 tablet (25 mg total) by mouth every 4 (four) hours as needed for itching.  100 tablet  11  . meloxicam (MOBIC) 7.5 MG tablet Take 7.5 mg by mouth daily.        . metoprolol tartrate (LOPRESSOR) 25 MG tablet Take 25 mg by mouth 2 (two) times daily.        [name] omeprazole (PRILOSEC) 20 MG capsule Take 20 mg by mouth daily.        [name] oxyCODONE-acetaminophen (PERCOCET) 10-325 MG per tablet Take 1 tablet by mouth every 4 (four) hours as needed.       . promethazine (PHENERGAN) 25 MG tablet Take 25 mg by mouth every 6 (six) hours as needed.        . simvastatin (ZOCOR) 40 MG tablet Take 40 mg by mouth at bedtime.          Allergies  Allergen Reactions  . Mepergan (Meperidine-Promethazine)     Cardiac arrest  . Diflucan     Pt unsure   . Iohexol      Code: HIVES, Desc: pt states her face and throat swells when administered IV contrast - KB, Onset Date: [id number]   . Valsartan     REACTION: pt states INTOL to Diovan    Review of Systems          See HPI - all other systems neg except as noted... The patient complains of dyspnea on exertion, muscle weakness, and fatigue. The patient denies anorexia, fever, weight loss, weight gain, vision loss, decreased hearing, hoarseness, chest pain, syncope, peripheral edema, prolonged cough,  hemoptysis, abdominal pain, melena, hematochezia, severe indigestion/heartburn, hematuria, incontinence, suspicious skin lesions, transient blindness, depression, unusual weight change, abnormal bleeding, enlarged lymph nodes, and angioedema.     Objective:   Physical Exam      WD, WN, 65 y/o WF in NAD... GENERAL:  Alert & oriented; pleasant & cooperative... HEENT:  [place]/AT, EOM-wnl, PERRLA, EACs-clear, TMs-wnl, NOSE-clear, THROAT-clear & wnl. NECK:  Supple w/ fairROM; no JVD; normal carotid impulses w/o bruits; no thyromegaly or nodules palpated; no lymphadenopathy. CHEST:  Clear to P & A; without wheezes/ rales/ or rhonchi. HEART:  Regular Rhythm;  without murmurs/ rubs/ or gallops. ABDOMEN:  Soft & nontender; normal bowel sounds; no organomegaly or masses detected. EXT: s/p right TKR, mod arthritic changes; no varicose veins/ +venous insuffic/ tr edema.  NEURO:  CN's intact;  no focal neuro deficits...     Assessment & Plan:

## 2011-05-13 NOTE — Patient Instructions (Signed)
Try cutting back on Percocet as tolerated.  May use Tylenol in place of if needed.  Alternate ice and heat to knee Please contact office for sooner follow up if symptoms do not improve or worsen or seek emergency care '

## 2011-05-31 ENCOUNTER — Other Ambulatory Visit (INDEPENDENT_AMBULATORY_CARE_PROVIDER_SITE_OTHER): Payer: 59 | Admitting: *Deleted

## 2011-05-31 DIAGNOSIS — E7800 Pure hypercholesterolemia, unspecified: Secondary | ICD-10-CM

## 2011-05-31 LAB — HEPATIC FUNCTION PANEL
ALT: 15 U/L (ref 0–35)
AST: 21 U/L (ref 0–37)
Albumin: 3.8 g/dL (ref 3.5–5.2)
Total Protein: 6.8 g/dL (ref 6.0–8.3)

## 2011-06-02 LAB — NMR LIPOPROFILE WITH LIPIDS
LDL (calc): 143 mg/dL — ABNORMAL HIGH (ref <100)
LDL Particle Number: 2553 nmol/L — ABNORMAL HIGH (ref <1000)
Small LDL Particle Number: 1602 nmol/L — ABNORMAL HIGH (ref <=527)

## 2011-06-10 ENCOUNTER — Ambulatory Visit (INDEPENDENT_AMBULATORY_CARE_PROVIDER_SITE_OTHER): Payer: 59

## 2011-06-10 VITALS — BP 162/88 | HR 60 | Wt 167.0 lb

## 2011-06-10 DIAGNOSIS — E7800 Pure hypercholesterolemia, unspecified: Secondary | ICD-10-CM

## 2011-06-10 NOTE — Patient Instructions (Signed)
Start Niacin 500mg  once daily.  If you have flushing or hot flashes, please call the office [phone])  Continue your vegan diet and juicing daily  Try to start exercising again.  This can be riding your bicycle or going to the swimming pool.   Recheck labs in 3 months.

## 2011-06-14 ENCOUNTER — Telehealth: Payer: Self-pay | Admitting: *Deleted

## 2011-06-14 LAB — DIFFERENTIAL
Basophils Absolute: 0
Eosinophils Relative: 2
Lymphocytes Relative: 24
Lymphs Abs: 2.4
Neutro Abs: 6.8

## 2011-06-14 LAB — CARDIAC PANEL(CRET KIN+CKTOT+MB+TROPI)
Relative Index: INVALID
Troponin I: 0.03

## 2011-06-14 LAB — COMPREHENSIVE METABOLIC PANEL
AST: 13
Albumin: 4
BUN: 13
CO2: 29
Calcium: 9.6
Creatinine, Ser: 1
GFR calc Af Amer: >60
GFR calc non Af Amer: 56 — ABNORMAL LOW

## 2011-06-14 LAB — URINE CULTURE

## 2011-06-14 LAB — CBC
HCT: 41.1
MCHC: 35
MCV: 84.3
Platelets: 283

## 2011-06-14 LAB — LIPASE, BLOOD: Lipase: 26

## 2011-06-14 NOTE — Telephone Encounter (Signed)
Pt called, states pt has been having facial flushing and hot flashes since she started on samples of Niacin given at last OV.  Pt went to drug store and bought some non-flushing Niacin and would like to know if this is OK to try instead.  Please advise.

## 2011-06-15 NOTE — Telephone Encounter (Signed)
Spoke with pt.  She has tried non-flushing Niacin and had less of reaction.  She is going to try to continue this until next visit.

## 2011-06-16 MED ORDER — NIACIN ER (ANTIHYPERLIPIDEMIC) 500 MG PO TBCR: 500.0000 mg | EXTENDED_RELEASE_TABLET | Freq: Every day | ORAL | Status: DC

## 2011-06-16 NOTE — Assessment & Plan Note (Signed)
Pt's cholesterol panel: TC- 225 (goal<200), TG- 275 (goal<150), HDL- 27 (goal>45), LDL-C- 161 (goal<130), LDL-P- 2553 (goal<1300), small particle number- 1602.  LFTs are WNL.  At last visit, discussed usefulness of lipoprofile to help determine if more aggressive lipid-lowering therapy would be needed.  Given elevated LDL-P and large number of small particles, would be advantageous to add additional therapy.  Pt's LDL-C also incrased from 91 to 143.  Likely due to lack of exercise.  Will add Niacin to help lower LDL-P and TG and improve HDL.  Encouraged pt to start exercising since this change seems to have had a large effect on her lipid profile.  Will f/u in 3 months.

## 2011-06-16 NOTE — Progress Notes (Signed)
HPI     [name] presents to the clinic today for dyslipidemia follow-up. She is currently on Simvastatin 40 mg daily and Fish oil 4 grams daily. She is compliant with her medications and does not have any pain related to her simvastatin.  She does have some chronic pain related to fibromyalgia.  Since last visit, she has been working on weaning herself off of her pain medicine, which has increased the pain from the fibromyalgia.  She has been able to wean down to 2 Percocets per day on average.  She has had some increased stressors since last visit.  Her husband recently had surgery and is scheduled to have surgery again on July 07, 2023.  They have also had 2 deaths to people close to the family.        Pt continues her vegan diet.  She has started making her own spinach and carrot juice everyday.  Her husband makes almond milk and flax seed crackers himself.  Her one splurge is baked spaghetti once every 2-3 weeks.        Unfortunately due to increase in pain and unexpected life circumstances, pt has not been exercising recently.  [facility] 5-8 miles per day.   Current Outpatient Prescriptions on File Prior to Visit  Medication Sig Dispense Refill  . ALPRAZolam (XANAX) 0.5 MG tablet Take 1 tablet (0.5 mg total) by mouth 3 (three) times daily as needed.  90 tablet  5  . Cholecalciferol (VITAMIN D) 2000 UNITS CAPS Take 1 capsule by mouth daily.        . fish oil-omega-3 fatty acids 1000 MG capsule Take 2 g by mouth 2 (two) times daily.        . hydrocortisone (ANUSOL-HC) 2.5 % rectal cream Place 1 application rectally 2 (two) times daily as needed.        . hydrOXYzine (ATARAX) 25 MG tablet Take 1 tablet (25 mg total) by mouth every 4 (four) hours as needed for itching.  100 tablet  11  . meloxicam (MOBIC) 7.5 MG tablet Take 7.5 mg by mouth daily.        . metoprolol tartrate (LOPRESSOR) 25 MG tablet Take 25 mg by mouth 2 (two) times daily.        . Multiple  Vitamins-Iron (MULTIVITAMIN/IRON PO) Take by mouth.        [name] omeprazole (PRILOSEC) 20 MG capsule Take 20 mg by mouth daily.        [name] oxyCODONE-acetaminophen (PERCOCET) 10-325 MG per tablet Take 1 tablet by mouth every 4 (four) hours as needed.       . promethazine (PHENERGAN) 25 MG tablet Take 25 mg by mouth every 6 (six) hours as needed.        . simvastatin (ZOCOR) 40 MG tablet Take 40 mg by mouth at bedtime.          Allergies  Allergen Reactions  . Mepergan (Meperidine-Promethazine)     Cardiac arrest  . Iohexol      Code: HIVES, Desc: pt states her face and throat swells when administered IV contrast - KB, Onset Date: [id number]   . Valsartan     REACTION: pt states INTOL to Diovan

## 2011-06-21 ENCOUNTER — Other Ambulatory Visit: Payer: Self-pay | Admitting: Hematology & Oncology

## 2011-06-21 ENCOUNTER — Encounter (HOSPITAL_BASED_OUTPATIENT_CLINIC_OR_DEPARTMENT_OTHER): Payer: 59 | Admitting: Hematology & Oncology

## 2011-06-21 DIAGNOSIS — D472 Monoclonal gammopathy: Secondary | ICD-10-CM

## 2011-06-21 LAB — CBC WITH DIFFERENTIAL (CANCER CENTER ONLY)
BASO#: 0 10*3/uL (ref 0.0–0.2)
Eosinophils Absolute: 0.2 10*3/uL (ref 0.0–0.5)
HCT: 37.4 % (ref 34.8–46.6)
HGB: 12.7 g/dL (ref 11.6–15.9)
LYMPH#: 1.9 10*3/uL (ref 0.9–3.3)
MCHC: 34 g/dL (ref 32.0–36.0)
MONO#: 0.3 10*3/uL (ref 0.1–0.9)
NEUT#: 3.3 10*3/uL (ref 1.5–6.5)
NEUT%: 58.8 % (ref 39.6–80.0)
RBC: 4.42 10*6/uL (ref 3.70–5.32)

## 2011-06-23 LAB — SPEP & IFE WITH QIG
Beta 2: 7.4 % — ABNORMAL HIGH (ref 3.2–6.5)
Beta Globulin: 6.8 % (ref 4.7–7.2)
IgA: 296 mg/dL (ref 69–380)
IgG (Immunoglobin G), Serum: 887 mg/dL (ref 690–1700)
M-Spike, %: 0.38 g/dL

## 2011-06-23 LAB — LACTATE DEHYDROGENASE: LDH: 136 U/L (ref 94–250)

## 2011-06-23 LAB — ABO/RH: ABO/RH(D): A POS

## 2011-06-23 LAB — IRON AND TIBC
%SAT: 17 % — ABNORMAL LOW (ref 20–55)
TIBC: 395 ug/dL (ref 250–470)

## 2011-06-23 LAB — TYPE AND SCREEN
ABO/RH(D): A POS
Antibody Screen: NEGATIVE

## 2011-06-23 LAB — VITAMIN D 25 HYDROXY (VIT D DEFICIENCY, FRACTURES): Vit D, 25-Hydroxy: 51 ng/mL (ref 30–89)

## 2011-06-24 LAB — COMPREHENSIVE METABOLIC PANEL
ALT: 18 U/L (ref 0–35)
CO2: 27 mEq/L (ref 19–32)
Calcium: 9.5 mg/dL (ref 8.4–10.5)
Chloride: 107 mEq/L (ref 96–112)
GFR calc non Af Amer: 49 mL/min — ABNORMAL LOW (ref >60)
Glucose, Bld: 116 mg/dL — ABNORMAL HIGH (ref 70–99)
Sodium: 143 mEq/L (ref 135–145)
Total Bilirubin: 0.7 mg/dL (ref 0.3–1.2)

## 2011-06-24 LAB — UIFE/LIGHT CHAINS/TP QN, 24-HR UR
Alpha 1, Urine: NOT DETECTED
Alpha 2, Urine: NOT DETECTED
Beta, Urine: NOT DETECTED
Free Lt Chn Excr Rate: 44.16 mg/d
Total Protein, Urine: 1.2 mg/dL
Volume, Urine: 4600 mL

## 2011-06-24 LAB — STOOL CULTURE

## 2011-06-24 LAB — FECAL LACTOFERRIN, QUANT

## 2011-06-24 LAB — CLOSTRIDIUM DIFFICILE EIA: C difficile Toxins A+B, EIA: NEGATIVE

## 2011-06-24 LAB — CBC
Hemoglobin: 13.2 g/dL (ref 12.0–15.0)
MCHC: 33.1 g/dL (ref 30.0–36.0)
MCV: 84.5 fL (ref 78.0–100.0)
RBC: 4.73 MIL/uL (ref 3.87–5.11)
WBC: 7.7 10*3/uL (ref 4.0–10.5)

## 2011-06-24 LAB — PROTEIN ELECTROPH W RFLX QUANT IMMUNOGLOBULINS
Alpha-1-Globulin: 4.3 % (ref 2.9–4.9)
Alpha-2-Globulin: 12.8 % — ABNORMAL HIGH (ref 7.1–11.8)
M-Spike, %: 0.41 g/dL
Total Protein ELP: 7.4 g/dL (ref 6.0–8.3)

## 2011-06-24 LAB — HIGH SENSITIVITY CRP: CRP, High Sensitivity: 10.6 mg/L — ABNORMAL HIGH

## 2011-06-28 ENCOUNTER — Encounter (HOSPITAL_BASED_OUTPATIENT_CLINIC_OR_DEPARTMENT_OTHER): Payer: 59 | Admitting: Hematology & Oncology

## 2011-06-28 DIAGNOSIS — D509 Iron deficiency anemia, unspecified: Secondary | ICD-10-CM

## 2011-06-30 LAB — URINALYSIS, ROUTINE W REFLEX MICROSCOPIC
Bilirubin Urine: NEGATIVE
Glucose, UA: NEGATIVE
Hgb urine dipstick: NEGATIVE
Specific Gravity, Urine: 1.016
pH: 5.5

## 2011-06-30 LAB — HEMOGLOBIN AND HEMATOCRIT, BLOOD
HCT: 39.1
Hemoglobin: 13.5

## 2011-06-30 LAB — BASIC METABOLIC PANEL
BUN: 11
BUN: 5 — ABNORMAL LOW
Chloride: 103
Creatinine, Ser: 0.79
GFR calc Af Amer: >60
GFR calc non Af Amer: 59 — ABNORMAL LOW
GFR calc non Af Amer: >60
Glucose, Bld: 136 — ABNORMAL HIGH
Potassium: 4.1
Potassium: 4.3
Sodium: 138

## 2011-06-30 LAB — CBC
HCT: 34.6 — ABNORMAL LOW
MCV: 85.2
Platelets: 276
RDW: 13.1

## 2011-06-30 LAB — TYPE AND SCREEN
ABO/RH(D): A POS
Antibody Screen: NEGATIVE

## 2011-06-30 LAB — URINE MICROSCOPIC-ADD ON

## 2011-08-02 ENCOUNTER — Ambulatory Visit (INDEPENDENT_AMBULATORY_CARE_PROVIDER_SITE_OTHER)
Admission: RE | Admit: 2011-08-02 | Discharge: 2011-08-02 | Disposition: A | Discharge status: Home / Self Care | Payer: 59 | Source: Ambulatory Visit | Attending: Pulmonary Disease | Admitting: Pulmonary Disease

## 2011-08-02 ENCOUNTER — Ambulatory Visit (INDEPENDENT_AMBULATORY_CARE_PROVIDER_SITE_OTHER): Payer: 59 | Admitting: Pulmonary Disease

## 2011-08-02 ENCOUNTER — Other Ambulatory Visit: Payer: Self-pay | Admitting: Pulmonary Disease

## 2011-08-02 ENCOUNTER — Other Ambulatory Visit (INDEPENDENT_AMBULATORY_CARE_PROVIDER_SITE_OTHER): Payer: 59

## 2011-08-02 ENCOUNTER — Encounter: Payer: Self-pay | Admitting: Pulmonary Disease

## 2011-08-02 VITALS — BP 130/80 | HR 66 | Temp 97.3°F | Ht 66.5 in | Wt 166.2 lb

## 2011-08-02 DIAGNOSIS — M545 Low back pain, unspecified: Secondary | ICD-10-CM

## 2011-08-02 DIAGNOSIS — Z0000 Encounter for general adult medical examination without abnormal findings: Secondary | ICD-10-CM

## 2011-08-02 DIAGNOSIS — E559 Vitamin D deficiency, unspecified: Secondary | ICD-10-CM

## 2011-08-02 DIAGNOSIS — M1990 Unspecified osteoarthritis, unspecified site: Secondary | ICD-10-CM

## 2011-08-02 DIAGNOSIS — I10 Essential (primary) hypertension: Secondary | ICD-10-CM

## 2011-08-02 DIAGNOSIS — Z23 Encounter for immunization: Secondary | ICD-10-CM

## 2011-08-02 DIAGNOSIS — K5730 Diverticulosis of large intestine without perforation or abscess without bleeding: Secondary | ICD-10-CM

## 2011-08-02 DIAGNOSIS — G894 Chronic pain syndrome: Secondary | ICD-10-CM

## 2011-08-02 DIAGNOSIS — M899 Disorder of bone, unspecified: Secondary | ICD-10-CM

## 2011-08-02 DIAGNOSIS — K219 Gastro-esophageal reflux disease without esophagitis: Secondary | ICD-10-CM

## 2011-08-02 DIAGNOSIS — E8809 Other disorders of plasma-protein metabolism, not elsewhere classified: Secondary | ICD-10-CM

## 2011-08-02 DIAGNOSIS — D126 Benign neoplasm of colon, unspecified: Secondary | ICD-10-CM

## 2011-08-02 DIAGNOSIS — IMO0001 Reserved for inherently not codable concepts without codable children: Secondary | ICD-10-CM

## 2011-08-02 DIAGNOSIS — E7800 Pure hypercholesterolemia, unspecified: Secondary | ICD-10-CM

## 2011-08-02 DIAGNOSIS — F411 Generalized anxiety disorder: Secondary | ICD-10-CM

## 2011-08-02 MED ORDER — SIMVASTATIN 40 MG PO TABS: 40.0000 mg | ORAL_TABLET | Freq: Every day | ORAL | Status: DC

## 2011-08-02 MED ORDER — METOPROLOL TARTRATE 25 MG PO TABS: 25.0000 mg | ORAL_TABLET | Freq: Two times a day (BID) | ORAL | Status: DC

## 2011-08-02 MED ORDER — MELOXICAM 7.5 MG PO TABS: ORAL_TABLET | ORAL | Status: DC

## 2011-08-02 MED ORDER — OMEPRAZOLE 20 MG PO CPDR: 20.0000 mg | DELAYED_RELEASE_CAPSULE | Freq: Every day | ORAL | Status: DC

## 2011-08-02 NOTE — Progress Notes (Signed)
Subjective:    Patient ID: [name], female    DOB: 1946-08-06, 65 y.o.   MRN: [id number]  HPI 65 y/o WF here for a follow up visit... she has mult med problems and is well known to [place]... also followed in the Lipid Clinic, and by numerous specialists as noted below...   ~  Jan 26, 2010:  Sleep eval [name] w/ 11/10 study showing AHI= 25 w/ obstructive & central events... CPAP titration was suboptimal & she was tried on BiPAP... she tells me that she stopped all this when she lost weight & husb confirms no snoring or sleep disordered breathing- rests well etc...  she saw [name] for f/u MGUS & rising IgM level> he felt she was stable compared to his old values & rec CT Scans to r/o lymphoma (done 4/11 & neg- no lesions seen, mild hepatomegaly noted)...  she also followed by [name] for Urology w/ IC> on Elmiron orally & in bladder (she does self caths now)...  also followed by [name] in the Pain Center now on referral from Ortho> tried on NUCYNTA ("too expensive")...  also notes improvement in FLP/ lipid clinic f/u on Simva40 & weight loss via diet etc...  ~  May 28, 2010:  states tick bite 8/18 "[name] says it was a deer tic" on upper inner left bicep area... given Doxy x7d per [name] & no systemic symptoms (no HA, fever, change in FM symptoms) but notes rash, itching, sweats, & pain in arm... on exam> no signif rash, sm red spot where she says tick was removed (no resid parts left), no erythema migrans etc... we discussed checking CBC & Lyme titers; Rx w/ Atarax for itching.  ~  July 29, 2010:  states she's doing "so-so"> pain management & [facility] (DrJinnah- Ortho) sent her to [facility]- [name] for another opinion about her knee- notes pending & she requiring Oxycodone 15mg  Tid for elief...  BP is well controlled on meds;  she denies CP & notes rare self-lim palpit, SOB at baseline & too sedentary due to ortho probs;  Lipids stable on Simva40;  GI stable on numerous meds;  &  continues on Alpraz 3/d for nerves...  ~  Jan 27, 2011:  14mo ROV & she is basically stable> still fatigued and now c/o itching, no rash, & we discussed Atarax for Prn use;  She feels she has improved on vegan diet along w/ her husb & [facility];  She is still seeing Pain Management & says "they say I have inflammation in my body";  She recently saw [name] for her 14mo f/u Heme/Onc testing...  ~  August 02, 2011:  14mo ROV & CPX per pt request> she is doing well on husb's vegan diet & wt down 12# further to 166# today w/ BMI=26; still c/o tired, fatigued- but better after IV Fe by [name];  BP improved on Metoprolol & she wants to decr to 1/d;  FLP followed in the Lipid Clinic on Simva, Niacin, Fish Oil;  States bowels are improved on carrot/spinach juiced by husb daily;  Still followed by pain management & taking Percocet 20mg  every 4H (ave 3-4 per day) she says;  See prob list below>>           Problem List:  UNSPECIFIED SLEEP APNEA (ICD-780.57) - Complex sleep apnea - PSG 07/25/09 AHI 28 > ~  Sleep eval [name] w/ 11/10 study showing AHI= 25 w/ obstructive & central events... CPAP titration was  suboptimal & she was tried on BiPAP... she tells me that she stopped all this when she lost weight & husb confirms no snoring or sleep disordered breathing- rests well etc...   ALLERGIC RHINITIS (ICD-477.9) Hx of SINUSITIS (ICD-473.9) - Hx sinusitis followed by [name] at [facility]...  HYPERTENSION (ICD-401.9) - on METOPROLOL 25mg  Bid... she was hosp 3/09 w/ gastroenteritis & BP meds were stopped due to weakness... grad restarted and increased w/ improvement in BP and palpitations... ~  11/11: BP= 122/70 she is INTOL to Diovan; denies HA, visual symptoms, CP, palpit, dizzy, syncope, edema; husb notes that BPs are similar at home ~120/70's... ~  5/12:  BP= 112/70 & she has the same chr fatigue complaints, but no specific symptoms... ~  11/12:  BP=130/80 & she has the same chronic complaints  but denies CP, syncope, ch in SOB, edema; [facility]; she wants to decr the Metoprolol to 1/d. ~  CXR 11/12 showed clear lungs, normal heart size, mild DJD TSpine...  CHEST PAIN (ICD-786.50) - seen by [name] in 2006... ~  2DEcho 11/98 showed norm valves, norm LVF, ? min LVH... ~  cath 7/03 w/ norm coronaries, & norm LVF... ~  NuclearStressTest 2/06 showed no infarct, no ischemia, EF=65%... ~  Myoview repeated 4/10: low risk study w/ soft tissue attenuation, normal wall motion, EF= 78%... ~  EKG 11/12 showed NSR, WNL.Marland KitchenMarland Kitchen  PALPITATIONS, HX OF (ICD-V12.50) - notes occas fluttering and "jello feeling" in her chest... Metoprolol helps.  HYPERCHOLESTEROLEMIA (ICD-272.0) - on ZOCOR 40mg Qhs + Fish Oil  + OTC Niacin> followed in the LC. ~  FLP 01/03/08 on SIMVASTATIN 20mg /d showed TChol 206, TG 182, HDL 31, LDL 145... ~  FLP 6/09 (on Simva40) showed TChol 173, TG 199, HDL 31, LDL 102... continue f/u in lipid clinic. ~  FLP 11/09 (on Simva40) showed TChol 196, TG 265, HDL 32, LDL 119... she stopped lipid clinic. ~  Due to continued "side effects" she placed the Simvastatin on hold, then restarted 40mg /d... ~  FLP 6/10 showed TChol 218, TG 394, HDL 33, LDL 129...  ~  FLP 11/10 showed TChol 262, TG 568, HDL 31, LDL 144... she needs to ret to the Lipid Clinic. ~  FLP 1/11 after 20# wt loss showed TChol 144, TG 181, HDL 43, LDL 65 on Simva40 ~  FLP 10/11 on Simva40+FishOil showed TChol 145, TG 237, HDL 33, LDL 79 ~  FLP 2/12 on Simva40+FishOil showed TChol 126, TG 238, HDL 27, LDL 66 ~  FLP 6/12 in EPIC per LC showed TChol 160, TG 226, HDL 31, LDL 92... Continue meds, low fat diet, exercise.  GERD (ICD-530.81) - on PRILOSEC 20mg /d, and Phenergan as needed... ~  prev EGD 8/05 showed 3cmHH, gastric polyp... prev EGD w/ stricture dilated. ~  EGD repeated 12/09 & showed 5cm HH w/ healing ulcer, esophagitis, gastritis, etc... HPylori neg... REC> continue Prilosec, Phenergan. ~  she saw  [name], [name] 8/10 for eval of nausea- no help from Reglan, Scopalamine, etc... had UGI/ Ba swallow- sm HH, mild reflux, norm peristalsis;  Gastric Emptying Scan- normal;  EGD 9/10 by [facility] w/ esoph ulcer seen, neg CLO, benign gastric polyp removed, Rx'd PPI. ~  5/12:  She states that she no longer has any GI issues on her vegan diet... ~  11/12:  She notes that Gabriel Rung gives her juiced carrots and spinach daily...  DIVERTICULOSIS OF COLON (ICD-562.10) - Rx w/ PROCTOSOL HC Cream Prn, and stool softeners... COLONIC POLYPS (  ICD-211.3) - colonoscopy 8/07 by [name] showed divertics only...  CYSTITIS (ICD-595.9) - she was seen at [facility] for poss interstitial cystitis... prev DMSO Rx. ~  Eval by [name] & pt tells me she is on Elmiron both orally & via bladder (self-cath). ~  9/11:  she reports IC is quiet & off Elmiron Rx now. ~  11/12:  She confirms min symptoms at present...  DEGENERATIVE JOINT DISEASE (ICD-715.90) - prev Rx MOBIC 7.5mg  Prn, now on OXYCODONE- taking daily for her pain per [name]... she had a right TKR 12/07 by [name]... then required right Patella surg 10/08 from [name]... now s/p right TKR revision by DrJinna at [facility]- they referred to pain management... ~  9/11: she tells me that [name] is referring her to [facility] for another opinion regarding her knees ([name]- there was nothing they could do & rec Pain Management, [facility], & veggie diet). ~  11/12:  She reports left knee bone-on-bone  W/ shot from [facility] 10/12 which helped; still followed by Pain Management & she tells me she's taking Percocet 20mg  every 4H (ave 3-4 per day).  BACK PAIN, LUMBAR (ICD-724.2) - she had a right L4-5 decompressive laminotomy w/ foraminotomy 12/02 by DrPool... then L4-5 decompression & fusion 9/09 by DrPool due to DDD w/ synovial cyst & degen spondylolithesis w/ stenosis... f/u XRays and MRI 5/10 showed fusion intact, NAD.[name]. ~  CHRONIC PAIN MANAGEMENT> followed  by [name] clinic now, on OXYCODONE 20mg  every 4H prn (ave 3-4/d she says), plus MOBIC 7.5mg /d.  FIBROMYALGIA (ICD-729.1) - she continues to have mult somatic complaints... she has seen DrDeveshwar in the past...  ~  She reports trial of Accupuncture> it helped the FM but not the knee pain.  OSTEOPENIA (ICD-733.90) - I cannot locate recent BMD, we will discuss f/u study... VITAMIN D DEFICIENCY (ICD-268.9) -  ~  Vit D level was 5 (30-90) 11/08 & rec to start VitD 50K/wk... ~  Vit D level 11/09 = 45  ~  Vit D level 6/10 = 23 ~  Vit D level 11/10 = 35 ~  Vit D level 5/11 = 70... switched to Vit D 2000u daily.  ANXIETY (ICD-300.00) - on ALPRAZOLAM 0.5mg  as needed- taking 3 per day recently. DEPRESSION (ICD-311)  Hx of PSORIASIS (ICD-696.1)  Hx of UNSPECIFIED DISORDER PLASMA PROTEIN METABOLISM (ICD-273.9) - she has a biclonal gammopathy evaluated 10/05 by [name] who thought it was reactive, perhaps a MGUS variant... ~  f/u labs 11/09 showed Hg= 12.2, TProt= 7.1, SPE/ IEP w/ biclonal IgM kappa protein present accounting for 0.31g/dl of the total 1.61 g/dl of protein in the gamma region & IgM level = 319 mg/dl... ~  labs 11/10 showed Hg= 12.5, TProt= 7.8, SPEP/IEP w/ monoclonal IgM kappa & IgM lamba proteins accounting for 0.44 g/dl of the total 0.96 g/dl in the gamma region... IgM level = 490mg /dl... ~  11/10:  Referred back to [name] who has resumed follow up of this problem... ~  4/11:  [name] did CT Chest/ Abd/ Pelvis> neg w/o adenopathy etc... ~  2012> she was given IV iron by [name] & reports feeling some better... ~  11/12:  Labs here showed Hg= 13.7, Fe= 61 (18%sat) ~  Labs from [employer] reviewed in EPIC flow sheet...   Past Surgical History  Procedure Date  . Laminotomy 2010    L-4, L-5 FUSION  . Knee arthroscopy     RIGHT KNEE X 5  . Total knee arthroplasty 12-07 and 4-07  Dr. Shelle Iron, revision 12-09 Dr. Eileen Stanford at [place]  . Patella fracture surgery 10-08    Dr.  Charlann Boxer  . Posterior laminectomy / decompression lumbar spine 9-09    Dr. Jordan Likes  . Cholecystectomy   . Hand surgery     TRIGGER FINGER  . Hernia repair   . Abdominal hysterectomy     for prolapse with abdominal incision  . Laparoscopic oophorectomy 2010    BILATERAL    Outpatient Encounter Prescriptions as of 08/02/2011  Medication Sig Dispense Refill  . ALPRAZolam (XANAX) 0.5 MG tablet Take 1 tablet (0.5 mg total) by mouth 3 (three) times daily as needed.  90 tablet  5  . Cholecalciferol (VITAMIN D) 2000 UNITS CAPS Take 1 capsule by mouth daily.        . fish oil-omega-3 fatty acids 1000 MG capsule Take 2 g by mouth 2 (two) times daily.        . hydrocortisone (ANUSOL-HC) 2.5 % rectal cream Place 1 application rectally 2 (two) times daily as needed.        . hydrOXYzine (ATARAX) 25 MG tablet Take 1 tablet (25 mg total) by mouth every 4 (four) hours as needed for itching.  100 tablet  11  . meloxicam (MOBIC) 7.5 MG tablet Take 2 tablets by mouth daily      . metoprolol tartrate (LOPRESSOR) 25 MG tablet Take 25 mg by mouth 2 (two) times daily.        . Multiple Vitamins-Iron (MULTIVITAMIN/IRON PO) Take by mouth.        [name] omeprazole (PRILOSEC) 20 MG capsule Take 20 mg by mouth daily.        . niacin (NIASPAN) 500 MG CR tablet Take 1 tablet (500 mg total) by mouth at bedtime.      [name] oxyCODONE-acetaminophen (PERCOCET) 10-325 MG per tablet Take 1 tablet by mouth every 4 (four) hours as needed.       . promethazine (PHENERGAN) 25 MG tablet Take 25 mg by mouth every 6 (six) hours as needed.        . simvastatin (ZOCOR) 40 MG tablet Take 40 mg by mouth at bedtime.           Allergies  Allergen Reactions  . Mepergan (Meperidine-Promethazine)     Cardiac arrest  . Iohexol      Code: HIVES, Desc: pt states her face and throat swells when administered IV contrast - KB, Onset Date: 16109604   . Valsartan     REACTION: pt states INTOL to Diovan    Current Medications, Allergies, Past Medical  History, Past Surgical History, Family History, and Social History were reviewed in [employer] record.    Review of Systems         See HPI - all other systems neg except as noted... The patient complains of dyspnea on exertion, muscle weakness, and difficulty walking.  The patient denies anorexia, fever, weight loss, weight gain, vision loss, decreased hearing, hoarseness, chest pain, syncope, peripheral edema, prolonged cough, headaches, hemoptysis, abdominal pain, melena, hematochezia, severe indigestion/heartburn, hematuria, incontinence, suspicious skin lesions, transient blindness, depression, unusual weight change, abnormal bleeding, enlarged lymph nodes, and angioedema.     Objective:   Physical Exam     WD, WN, 65 y/o WF in NAD... GENERAL:  Alert & oriented; pleasant & cooperative... HEENT:  [place]/AT, EOM-wnl, PERRLA, EACs-clear, TMs-wnl, NOSE-clear, THROAT-clear & wnl. NECK:  Supple w/ fairROM; no JVD; normal carotid impulses w/o bruits; no thyromegaly or nodules palpated;  no lymphadenopathy. CHEST:  Clear to P & A; without wheezes/ rales/ or rhonchi. HEART:  Regular Rhythm; without murmurs/ rubs/ or gallops. ABDOMEN:  Soft & nontender; normal bowel sounds; no organomegaly or masses detected. EXT: s/p right TKR, mod arthritic changes; no varicose veins/ +venous insuffic/ tr edema. BACK:  scar of surgery, +trigger points... NEURO:  CN's intact;  no focal neuro deficits... DERM:  tiny red spot upper inner left arm bicep area from prev tick bite- no tick remnants... dry skin, no signif rash noted.   Assessment & Plan:   HBP>  BP controlled on simple med, continue same + diet & exercise...  CHOL>  Followed in Lipid clinic & improved, TG still elev & instructed on better low fat diet...  GI>  She denies GI issues at present on her vegan diet...  GU>  Similarly the prev IC is improved & hasn't been on meds (Elmiron) for many months...  DJD/ FM/ Chr Pain>   She is followed in the Pain management clinic as noted above, & she had shot [facility] knee from [facility]- improved.  Myeloma vs MGUS>  Followed by [name], we do not have recent notes but she reports stable & he gave her some IV Fe & she feels sl better after this Rx.

## 2011-08-02 NOTE — Patient Instructions (Signed)
Today we updated your med list in our EPIC system...    Continue your current medications the same...    We refilled your prescriptions per request today...  Today we did your follow up CXR & fasting blood work...    Please call the PHONE TREE in a few days for your results...    Dial [id number] & when prompted enter your patient number followed by the # symbol...    Your patient number is:  [id number]#  Keep up the great job w/ diet, exercise/ wt reduction...  Call for any questions...  Let's plan a follow up recheck in 6 months.Marland KitchenMarland Kitchen

## 2011-08-03 LAB — URINALYSIS
Specific Gravity, Urine: >1.03 (ref 1.000–1.030)
Total Protein, Urine: NEGATIVE
Urine Glucose: NEGATIVE
Urobilinogen, UA: 0.2 (ref 0.0–1.0)

## 2011-08-03 LAB — URINALYSIS, ROUTINE W REFLEX MICROSCOPIC
Hgb urine dipstick: NEGATIVE
Nitrite: NEGATIVE
Total Protein, Urine: NEGATIVE
pH: 6 (ref 5.0–8.0)

## 2011-08-03 LAB — CBC WITH DIFFERENTIAL/PLATELET
Basophils Relative: 0.2 % (ref 0.0–3.0)
Eosinophils Absolute: 0.2 10*3/uL (ref 0.0–0.7)
Hemoglobin: 13.7 g/dL (ref 12.0–15.0)
Lymphs Abs: 2.3 10*3/uL (ref 0.7–4.0)
MCHC: 33.1 g/dL (ref 30.0–36.0)
MCV: 89.7 fl (ref 78.0–100.0)
Monocytes Absolute: 0.2 10*3/uL (ref 0.1–1.0)
Neutro Abs: 4 10*3/uL (ref 1.4–7.7)
RBC: 4.61 Mil/uL (ref 3.87–5.11)

## 2011-08-03 LAB — HEPATIC FUNCTION PANEL
Bilirubin, Direct: 0 mg/dL (ref 0.0–0.3)
Total Bilirubin: 0.6 mg/dL (ref 0.3–1.2)

## 2011-08-03 LAB — BASIC METABOLIC PANEL
Calcium: 9.3 mg/dL (ref 8.4–10.5)
Chloride: 105 mEq/L (ref 96–112)
Creatinine, Ser: 0.8 mg/dL (ref 0.4–1.2)
Sodium: 142 mEq/L (ref 135–145)

## 2011-08-06 ENCOUNTER — Telehealth: Payer: Self-pay | Admitting: Pulmonary Disease

## 2011-08-06 NOTE — Telephone Encounter (Signed)
Called and spoke with the pt and she stated that her insurance will cover the shingles vaccine.  She is wanting to get this done before next year.  SN please advise if ok to print out rx for the shingles vaccine for this pt.  thanks

## 2011-08-09 MED ORDER — ZOSTER VACCINE LIVE 19400 UNT/0.65ML ~~LOC~~ SOLR: 0.6500 mL | Freq: Once | SUBCUTANEOUS | Status: AC

## 2011-08-09 NOTE — Telephone Encounter (Signed)
lmomtcb for pt to see if she wants rx mailed to her or left up front to pick up.

## 2011-08-10 NOTE — Telephone Encounter (Signed)
Called and lmom to make pt aware that rx for the shingles vaccine has been mailed to her.

## 2011-08-17 ENCOUNTER — Telehealth: Payer: Self-pay | Admitting: Pulmonary Disease

## 2011-08-17 MED ORDER — ZOSTER VACCINE LIVE 19400 UNT/0.65ML ~~LOC~~ SOLR: 0.6500 mL | Freq: Once | SUBCUTANEOUS | Status: AC

## 2011-08-17 NOTE — Telephone Encounter (Signed)
Called and spoke with pt she is aware that rx has been re printed and left up front for her to pick up on Friday.  She stated that she never did receive the rx that was mailed to her.  Pt will be by on Friday to pick this rx up.

## 2011-08-17 NOTE — Telephone Encounter (Signed)
Called and lmom for pt to call back.  Looks like the rx for the shingles vaccine was mailed to the pt last week.

## 2011-08-23 ENCOUNTER — Telehealth: Payer: Self-pay | Admitting: Pulmonary Disease

## 2011-08-23 MED ORDER — AMOXICILLIN 500 MG PO CAPS: ORAL_CAPSULE | ORAL | Status: DC

## 2011-08-23 NOTE — Telephone Encounter (Signed)
LMTCB

## 2011-08-23 NOTE — Telephone Encounter (Signed)
Per SN: amoxicillin 500mg  #28, take as directed.  No refills.  Called left detailed message on named answering machine.  rx sent to [facility].  Encouraged pt to call with any questions / concerns.

## 2011-08-23 NOTE — Telephone Encounter (Signed)
Spoke with pt. She is requesting rx for amoxicillin to have on hand prior to dental work. She states that she has to take 4 tablets before each procedure, and would like to have a month supply called in so does not have to call every time to request this. Please advise, thanks!

## 2011-08-27 ENCOUNTER — Telehealth: Payer: Self-pay | Admitting: *Deleted

## 2011-08-27 NOTE — Telephone Encounter (Signed)
Called patient to ask her to call dr. Kriste Basque office on Monday to have him fax [place] the iron tests that were done in his office so we can compare.

## 2011-09-05 ENCOUNTER — Encounter: Payer: Self-pay | Admitting: Pulmonary Disease

## 2011-09-06 ENCOUNTER — Ambulatory Visit (INDEPENDENT_AMBULATORY_CARE_PROVIDER_SITE_OTHER): Payer: 59 | Admitting: *Deleted

## 2011-09-06 ENCOUNTER — Ambulatory Visit (INDEPENDENT_AMBULATORY_CARE_PROVIDER_SITE_OTHER): Payer: 59 | Admitting: Gynecology

## 2011-09-06 ENCOUNTER — Encounter: Payer: Self-pay | Admitting: Gynecology

## 2011-09-06 VITALS — BP 132/74 | Ht 65.5 in | Wt 164.0 lb

## 2011-09-06 DIAGNOSIS — Z01419 Encounter for gynecological examination (general) (routine) without abnormal findings: Secondary | ICD-10-CM

## 2011-09-06 DIAGNOSIS — M899 Disorder of bone, unspecified: Secondary | ICD-10-CM

## 2011-09-06 DIAGNOSIS — M8580 Other specified disorders of bone density and structure, unspecified site: Secondary | ICD-10-CM

## 2011-09-06 DIAGNOSIS — B373 Candidiasis of vulva and vagina: Secondary | ICD-10-CM

## 2011-09-06 DIAGNOSIS — N952 Postmenopausal atrophic vaginitis: Secondary | ICD-10-CM

## 2011-09-06 DIAGNOSIS — E7800 Pure hypercholesterolemia, unspecified: Secondary | ICD-10-CM

## 2011-09-06 LAB — HEPATIC FUNCTION PANEL
Albumin: 3.8 g/dL (ref 3.5–5.2)
Bilirubin, Direct: 0.1 mg/dL (ref 0.0–0.3)
Total Protein: 6.7 g/dL (ref 6.0–8.3)

## 2011-09-06 LAB — LIPID PANEL
Cholesterol: 113 mg/dL (ref 0–200)
HDL: 36 mg/dL — ABNORMAL LOW (ref >39.00)
Total CHOL/HDL Ratio: 3
Triglycerides: 87 mg/dL (ref 0.0–149.0)

## 2011-09-06 MED ORDER — FLUCONAZOLE 150 MG PO TABS: 150.0000 mg | ORAL_TABLET | Freq: Once | ORAL | Status: DC

## 2011-09-06 NOTE — Progress Notes (Signed)
[name] Jun 23, 1946 [id number]        65 y.o.  for annual exam.  Being followed for iron deficiency anemia. Without gynecologic complaints.  Past medical history,surgical history, medications, allergies, family history and social history were all reviewed and documented in the EPIC chart. ROS:  Was performed and pertinent positives and negatives are included in the history.  Exam: chaperone present Filed Vitals:   09/06/11 1110  BP: 132/74   General appearance  Normal Skin grossly normal Head/Neck normal with no cervical or supraclavicular adenopathy thyroid normal Lungs  clear Cardiac RR, without RMG Abdominal  soft, nontender, without masses, organomegaly or hernia Breasts  examined lying and sitting without masses, retractions, discharge or axillary adenopathy. Pelvic  Ext/BUS/vagina  normal with atrophic genital changes  Adnexa  Without masses or tenderness    Anus and perineum  normal   Rectovaginal  normal sphincter tone without palpated masses or tenderness.    Assessment/Plan:  65 y.o. female for annual exam.    1. Atrophic genital changes. Patient is asymptomatic and not currently sexually active.  We will continue to follow. 2. Osteopenia. -1.5 on DEXA November 2010.  Will repeat DEXA now a two-year interval. Increase calcium vitamin D reviewed. 3. Pap smear. She has no history of significant abnormal Pap smears in the past with last recorded Pap smear 2011. She is status post hysterectomy and I discussed current screening guidelines as far as less frequent intervals or stopping altogether. Pap was not done today and we'll readdress this on an annual basis. 4. Breast health. SBE monthly reviewed. Patient had mammography May 2012 and will continue on an annual basis. 5. Colonoscopy. Patient is up-to-date with last colonoscopy 3 years ago. She will repeat at their recommended interval. 6. Health maintenance. No blood work was done today as it is all done through her  primary who actively follows her. Assuming she continues well from a gynecologic standpoint she will see me in a year, sooner as needed.    Dara Lords MD, 12:03 PM 09/06/2011

## 2011-09-06 NOTE — Patient Instructions (Signed)
Follow up for bone density as scheduled otherwise in one year for GYN checkup.

## 2011-09-06 NOTE — Progress Notes (Signed)
Addended by: Landis Martins R on: 09/06/2011 02:42 PM   Modules accepted: Orders

## 2011-09-07 ENCOUNTER — Encounter: Payer: Self-pay | Admitting: *Deleted

## 2011-09-08 ENCOUNTER — Ambulatory Visit (INDEPENDENT_AMBULATORY_CARE_PROVIDER_SITE_OTHER): Payer: 59

## 2011-09-08 ENCOUNTER — Encounter: Payer: Self-pay | Admitting: Gynecology

## 2011-09-08 DIAGNOSIS — M8580 Other specified disorders of bone density and structure, unspecified site: Secondary | ICD-10-CM

## 2011-09-08 DIAGNOSIS — M899 Disorder of bone, unspecified: Secondary | ICD-10-CM

## 2011-09-09 ENCOUNTER — Ambulatory Visit: Payer: 59

## 2011-09-09 ENCOUNTER — Ambulatory Visit (INDEPENDENT_AMBULATORY_CARE_PROVIDER_SITE_OTHER): Payer: 59

## 2011-09-09 VITALS — Wt 165.0 lb

## 2011-09-09 DIAGNOSIS — E7800 Pure hypercholesterolemia, unspecified: Secondary | ICD-10-CM

## 2011-09-09 NOTE — Assessment & Plan Note (Signed)
Pt's cholesterol much improved with addition on Niacin.  TC- 113 (goal<200), TG-87 (goal<150), HDL- 36 (goal>45), and LDL- 60 (goal<100).  LFTs are WNL.  Had long discussion on statin versus red yeast rice.  Explained would not have the same LDL reduction with Red Yeast Rice as simvastatin.  She is willing to stay on simvastatin but would like to decrease dose to 20mg .  Agreed to try at reduced dose since pt exercising on regular basis again.  Will f/u in 4 months.

## 2011-09-09 NOTE — Progress Notes (Signed)
HPI     [name] presents to the clinic today for dyslipidemia follow-up. She is currently on Simvastatin 40 mg daily, Niacin 500mg  daily and Fish oil 4 grams daily. She is compliant with her medications and does not have any pain related to her simvastatin. Her husband does want her to come off the statin and try Red Yeast Rice instead.  She has had some flushing with the Niacin but states she can tolerate it.          Pt continues her vegan diet.  She has started making her own spinach and carrot juice everyday.  Her husband makes almond milk and flax seed crackers himself.  Her one splurge is baked spaghetti once every 2-3 weeks.       Pt has started to exercise again.  She [facility].  It takes her approximately 40 minutes to complete the 5 miles.   Current Outpatient Prescriptions on File Prior to Visit  Medication Sig Dispense Refill  . ALPRAZolam (XANAX) 0.5 MG tablet Take 1 tablet (0.5 mg total) by mouth 3 (three) times daily as needed.  90 tablet  5  . amoxicillin (AMOXIL) 500 MG capsule Take as directed.  28 capsule  0  . Cholecalciferol (VITAMIN D) 2000 UNITS CAPS Take 2 capsules by mouth daily.       . fish oil-omega-3 fatty acids 1000 MG capsule Take 2 g by mouth 2 (two) times daily.        . hydrocortisone (ANUSOL-HC) 2.5 % rectal cream Place 1 application rectally 2 (two) times daily as needed.        . hydrOXYzine (ATARAX) 25 MG tablet Take 1 tablet (25 mg total) by mouth every 4 (four) hours as needed for itching.  100 tablet  11  . meloxicam (MOBIC) 7.5 MG tablet Take 2 tablets by mouth daily  180 tablet  3  . Multiple Vitamins-Iron (MULTIVITAMIN/IRON PO) Take by mouth.        . niacin (NIASPAN) 500 MG CR tablet Take 1 tablet (500 mg total) by mouth at bedtime.      [name] oxyCODONE-acetaminophen (PERCOCET) 10-325 MG per tablet Take 1 tablet by mouth every 4 (four) hours as needed.       . promethazine (PHENERGAN) 25 MG tablet Take 25 mg  by mouth every 6 (six) hours as needed.        . simvastatin (ZOCOR) 40 MG tablet Take 1 tablet (40 mg total) by mouth at bedtime.  90 tablet  3    Allergies  Allergen Reactions  . Mepergan (Meperidine-Promethazine)     Cardiac arrest  . Iohexol      Code: HIVES, Desc: pt states her face and throat swells when administered IV contrast - KB, Onset Date: 16109604   . Valsartan     REACTION: pt states INTOL to Diovan

## 2011-09-09 NOTE — Patient Instructions (Addendum)
Great job on the improvement in your cholesterol  Decrease simvastatin to 20mg  (1/2 tablet) every day.    Continue other medications.   Recheck labs in April.  We will call you with an appointment.

## 2011-11-17 ENCOUNTER — Other Ambulatory Visit: Payer: Self-pay | Admitting: Gynecology

## 2011-11-22 DIAGNOSIS — M25559 Pain in unspecified hip: Secondary | ICD-10-CM | POA: Diagnosis not present

## 2011-11-22 DIAGNOSIS — G8928 Other chronic postprocedural pain: Secondary | ICD-10-CM | POA: Diagnosis not present

## 2011-11-22 DIAGNOSIS — M7150 Other bursitis, not elsewhere classified, unspecified site: Secondary | ICD-10-CM | POA: Diagnosis not present

## 2011-12-15 DIAGNOSIS — R1012 Left upper quadrant pain: Secondary | ICD-10-CM | POA: Diagnosis not present

## 2011-12-15 DIAGNOSIS — N3010 Interstitial cystitis (chronic) without hematuria: Secondary | ICD-10-CM | POA: Diagnosis not present

## 2011-12-15 DIAGNOSIS — M545 Low back pain: Secondary | ICD-10-CM | POA: Diagnosis not present

## 2011-12-22 DIAGNOSIS — M79609 Pain in unspecified limb: Secondary | ICD-10-CM | POA: Diagnosis not present

## 2011-12-22 DIAGNOSIS — G8928 Other chronic postprocedural pain: Secondary | ICD-10-CM | POA: Diagnosis not present

## 2011-12-22 DIAGNOSIS — M25569 Pain in unspecified knee: Secondary | ICD-10-CM | POA: Diagnosis not present

## 2012-01-04 DIAGNOSIS — M7150 Other bursitis, not elsewhere classified, unspecified site: Secondary | ICD-10-CM | POA: Diagnosis not present

## 2012-01-04 DIAGNOSIS — M25559 Pain in unspecified hip: Secondary | ICD-10-CM | POA: Diagnosis not present

## 2012-01-19 DIAGNOSIS — G8928 Other chronic postprocedural pain: Secondary | ICD-10-CM | POA: Diagnosis not present

## 2012-01-19 DIAGNOSIS — M25569 Pain in unspecified knee: Secondary | ICD-10-CM | POA: Diagnosis not present

## 2012-01-19 DIAGNOSIS — M25559 Pain in unspecified hip: Secondary | ICD-10-CM | POA: Diagnosis not present

## 2012-01-20 DIAGNOSIS — M25569 Pain in unspecified knee: Secondary | ICD-10-CM | POA: Diagnosis not present

## 2012-01-20 DIAGNOSIS — M25559 Pain in unspecified hip: Secondary | ICD-10-CM | POA: Diagnosis not present

## 2012-01-25 ENCOUNTER — Other Ambulatory Visit: Payer: Self-pay | Admitting: Pharmacist

## 2012-01-25 DIAGNOSIS — E785 Hyperlipidemia, unspecified: Secondary | ICD-10-CM

## 2012-02-01 ENCOUNTER — Ambulatory Visit (INDEPENDENT_AMBULATORY_CARE_PROVIDER_SITE_OTHER): Payer: Medicare Other | Admitting: Pulmonary Disease

## 2012-02-01 ENCOUNTER — Other Ambulatory Visit (INDEPENDENT_AMBULATORY_CARE_PROVIDER_SITE_OTHER): Payer: Medicare Other

## 2012-02-01 ENCOUNTER — Encounter: Payer: Self-pay | Admitting: Pulmonary Disease

## 2012-02-01 VITALS — BP 142/80 | HR 65 | Temp 96.9°F | Ht 66.5 in | Wt 172.8 lb

## 2012-02-01 DIAGNOSIS — E8809 Other disorders of plasma-protein metabolism, not elsewhere classified: Secondary | ICD-10-CM

## 2012-02-01 DIAGNOSIS — I10 Essential (primary) hypertension: Secondary | ICD-10-CM

## 2012-02-01 DIAGNOSIS — E7800 Pure hypercholesterolemia, unspecified: Secondary | ICD-10-CM

## 2012-02-01 DIAGNOSIS — IMO0001 Reserved for inherently not codable concepts without codable children: Secondary | ICD-10-CM

## 2012-02-01 DIAGNOSIS — K5730 Diverticulosis of large intestine without perforation or abscess without bleeding: Secondary | ICD-10-CM

## 2012-02-01 DIAGNOSIS — M1990 Unspecified osteoarthritis, unspecified site: Secondary | ICD-10-CM

## 2012-02-01 DIAGNOSIS — Z8679 Personal history of other diseases of the circulatory system: Secondary | ICD-10-CM | POA: Diagnosis not present

## 2012-02-01 DIAGNOSIS — E785 Hyperlipidemia, unspecified: Secondary | ICD-10-CM

## 2012-02-01 DIAGNOSIS — K219 Gastro-esophageal reflux disease without esophagitis: Secondary | ICD-10-CM

## 2012-02-01 DIAGNOSIS — F411 Generalized anxiety disorder: Secondary | ICD-10-CM

## 2012-02-01 DIAGNOSIS — D126 Benign neoplasm of colon, unspecified: Secondary | ICD-10-CM

## 2012-02-01 DIAGNOSIS — M545 Low back pain: Secondary | ICD-10-CM

## 2012-02-01 DIAGNOSIS — M25569 Pain in unspecified knee: Secondary | ICD-10-CM

## 2012-02-01 DIAGNOSIS — G894 Chronic pain syndrome: Secondary | ICD-10-CM

## 2012-02-01 LAB — HEPATIC FUNCTION PANEL
AST: 20 U/L (ref 0–37)
Alkaline Phosphatase: 104 U/L (ref 39–117)
Total Bilirubin: 0.5 mg/dL (ref 0.3–1.2)

## 2012-02-01 MED ORDER — METOPROLOL TARTRATE 25 MG PO TABS: ORAL_TABLET | ORAL | Status: DC

## 2012-02-01 MED ORDER — SIMVASTATIN 40 MG PO TABS: ORAL_TABLET | ORAL | Status: DC

## 2012-02-01 MED ORDER — ALPRAZOLAM 0.5 MG PO TABS: 0.5000 mg | ORAL_TABLET | Freq: Three times a day (TID) | ORAL | Status: DC | PRN

## 2012-02-01 MED ORDER — MELOXICAM 7.5 MG PO TABS: ORAL_TABLET | ORAL | Status: DC

## 2012-02-01 MED ORDER — HYDROXYZINE HCL 25 MG PO TABS: 25.0000 mg | ORAL_TABLET | ORAL | Status: DC | PRN

## 2012-02-01 MED ORDER — HYDROCORTISONE 2.5 % RE CREA: 1.0000 "application " | TOPICAL_CREAM | Freq: Two times a day (BID) | RECTAL | Status: DC | PRN

## 2012-02-01 MED ORDER — NIACIN ER (ANTIHYPERLIPIDEMIC) 500 MG PO TBCR: 500.0000 mg | EXTENDED_RELEASE_TABLET | Freq: Every day | ORAL | Status: DC

## 2012-02-01 NOTE — Patient Instructions (Signed)
Today we updated your med list in our EPIC system...    We decided to decrease the Metoprolol 25mg  to 1/2 tab each AM to see if it helps decrease your fatigue & slow heart rate...  Today we did the follow up Lipid Profile on the Simvastatin 20mg  dose...   You should hear from the Lipid Clinic later this week...  Call for any questions...  Let's continue our 6 month check ups & call sooner if needed for problems.Marland KitchenMarland Kitchen

## 2012-02-01 NOTE — Progress Notes (Signed)
Subjective:    Patient ID: [name], female    DOB: 1946-07-26, 66 y.o.   MRN: [id number]  HPI 66 y/o WF here for a follow up visit... she has mult med problems and is well known to [place]... also followed in the Lipid Clinic, and by numerous specialists as noted below...   ~  Jan 26, 2010:  Sleep eval [name] w/ 11/10 study showing AHI= 25 w/ obstructive & central events... CPAP titration was suboptimal & she was tried on BiPAP... she tells me that she stopped all this when she lost weight & husb confirms no snoring or sleep disordered breathing- rests well etc...  she saw [name] for f/u MGUS & rising IgM level> he felt she was stable compared to his old values & rec CT Scans to r/o lymphoma (done 4/11 & neg- no lesions seen, mild hepatomegaly noted)...  she also followed by [name] for Urology w/ IC> on Elmiron orally & in bladder (she does self caths now)...  also followed by [name] in the Pain Center now on referral from Ortho> tried on NUCYNTA ("too expensive")...  also notes improvement in FLP/ lipid clinic f/u on Simva40 & weight loss via diet etc...  ~  May 28, 2010:  states tick bite 8/18 "[name] says it was a deer tic" on upper inner left bicep area... given Doxy x7d per [name] & no systemic symptoms (no HA, fever, change in FM symptoms) but notes rash, itching, sweats, & pain in arm... on exam> no signif rash, sm red spot where she says tick was removed (no resid parts left), no erythema migrans etc... we discussed checking CBC & Lyme titers; Rx w/ Atarax for itching.  ~  July 29, 2010:  states she's doing "so-so"> pain management & [facility] (DrJinnah- Ortho) sent her to [facility]- [name] for another opinion about her knee- notes pending & she requiring Oxycodone 15mg  Tid for relief...  BP is well controlled on meds;  she denies CP & notes rare self-lim palpit, SOB at baseline & too sedentary due to ortho probs;  Lipids stable on Simva40;  GI stable on numerous meds;  &  continues on Alpraz 3/d for nerves...  ~  Jan 27, 2011:  45mo ROV & she is basically stable> still fatigued and now c/o itching, no rash, & we discussed Atarax for Prn use;  She feels she has improved on vegan diet along w/ her husb & [facility];  She is still seeing Pain Management & says "they say I have inflammation in my body";  She recently saw [name] for her 45mo f/u Heme/Onc testing...  ~  August 02, 2011:  45mo ROV & CPX per pt request> she is doing well on husb's vegan diet & wt down 12# further to 166# today w/ BMI=26; still c/o tired, fatigued- but better after IV Fe by [name];  BP improved on Metoprolol & she wants to decr to 1/d;  FLP followed in the Lipid Clinic on Simva, Niacin, Fish Oil;  States bowels are improved on carrot/spinach juiced by husb daily;  Still followed by pain management & taking Percocet 20mg  every 4H (ave 3-4 per day) she says;  See prob list below>>  ~  Feb 01, 2012:  45mo ROV & Ashanta continues to feel terrible overall- states she is in constant pain from her knees & legs; on Oxcodone per [name] pain clinic (states she is taking 3-6 tabs daily); Husb is inquiring about 2 clinics  he found on the internet one in [place] & one in [place] that do peripheral nerve surgery (I told him he was grasping & needed to discuss w/ her specialists if serious); she brought a BP log & pressures are ok but pulse is running low on her BBlocker so we agreed w/ decr Metoprolol 25mg - 1/2 Bid;  She is followed in the Lipid Clinic prev on Simva plus Niasp & Fish Oil, but husb wants her off Statins, they compromised & decr the Simva to 20mg /d & f/u labs pending...  We reviewed prob list, meds, xrays and labs>  See below>>    She saw DrFontaine, Gyn> routine check, atrophic changes, osteopenia- f/u BMD done 12/12 w/ lowest TScore -1.2 in right FemNeck...           Problem List:  UNSPECIFIED SLEEP APNEA (ICD-780.57) - Complex sleep apnea - PSG 07/25/09 AHI 28 > ~   Sleep eval [name] w/ 11/10 study showing AHI= 25 w/ obstructive & central events... CPAP titration was suboptimal & she was tried on BiPAP... she tells me that she stopped all this when she lost weight & husb confirms no snoring or sleep disordered breathing- rests well etc...   ALLERGIC RHINITIS (ICD-477.9) Hx of SINUSITIS (ICD-473.9) - Hx sinusitis followed by [name] at [facility]...  HYPERTENSION (ICD-401.9) - on METOPROLOL 25mg  Bid... she was hosp 3/09 w/ gastroenteritis & BP meds were stopped due to weakness... grad restarted and increased w/ improvement in BP and palpitations... ~  11/11: BP= 122/70 she is INTOL to Diovan; denies HA, visual symptoms, CP, palpit, dizzy, syncope, edema; husb notes that BPs are similar at home ~120/70's... ~  5/12:  BP= 112/70 & she has the same chr fatigue complaints, but no specific symptoms... ~  11/12:  BP=130/80 & she has the same chronic complaints but denies CP, syncope, ch in SOB, edema; [facility]; she wants to decr the Metoprolol to 1/d. ~  CXR 11/12 showed clear lungs, normal heart size, mild DJD TSpine...  CHEST PAIN (ICD-786.50) - seen by [name] in 2006... ~  2DEcho 11/98 showed norm valves, norm LVF, ? min LVH... ~  cath 7/03 w/ norm coronaries, & norm LVF... ~  NuclearStressTest 2/06 showed no infarct, no ischemia, EF=65%... ~  Myoview repeated 4/10: low risk study w/ soft tissue attenuation, normal wall motion, EF= 78%... ~  EKG 11/12 showed NSR, WNL.Marland KitchenMarland Kitchen  PALPITATIONS, HX OF (ICD-V12.50) - notes occas fluttering and "jello feeling" in her chest... Metoprolol helps.  HYPERCHOLESTEROLEMIA (ICD-272.0) - on ZOCOR 40mg Qhs + Fish Oil  + OTC Niacin> followed in the LC. ~  FLP 01/03/08 on SIMVASTATIN 20mg /d showed TChol 206, TG 182, HDL 31, LDL 145... ~  FLP 6/09 (on Simva40) showed TChol 173, TG 199, HDL 31, LDL 102... continue f/u in lipid clinic. ~  FLP 11/09 (on Simva40) showed TChol 196, TG 265, HDL 32, LDL 119... she stopped lipid  clinic. ~  Due to continued "side effects" she placed the Simvastatin on hold, then restarted 40mg /d... ~  FLP 6/10 showed TChol 218, TG 394, HDL 33, LDL 129...  ~  FLP 11/10 showed TChol 262, TG 568, HDL 31, LDL 144... she needs to ret to the Lipid Clinic. ~  FLP 1/11 after 20# wt loss showed TChol 144, TG 181, HDL 43, LDL 65 on Simva40 ~  FLP 10/11 on Simva40+FishOil showed TChol 145, TG 237, HDL 33, LDL 79 ~  FLP 2/12 on Simva40+FishOil showed TChol 126, TG 238,  HDL 27, LDL 66 ~  FLP 6/12 in EPIC per LC showed TChol 160, TG 226, HDL 31, LDL 92... Continue meds, low fat diet, exercise. ~  FLP 12/12 by LC on Simva40+Niasp500 showed TChol 113, TG 87, HDL 36, LDL 60 ~  She continues regular f/u in Lipid Clinic...  GERD (ICD-530.81) - on PRILOSEC 20mg /d, and Phenergan as needed... ~  prev EGD 8/05 showed 3cmHH, gastric polyp... prev EGD w/ stricture dilated. ~  EGD repeated 12/09 & showed 5cm HH w/ healing ulcer, esophagitis, gastritis, etc... HPylori neg... REC> continue Prilosec, Phenergan. ~  she saw [name], [name] 8/10 for eval of nausea- no help from Reglan, Scopalamine, etc... had UGI/ Ba swallow- sm HH, mild reflux, norm peristalsis;  Gastric Emptying Scan- normal;  EGD 9/10 by [facility] w/ esoph ulcer seen, neg CLO, benign gastric polyp removed, Rx'd PPI. ~  5/12:  She states that she no longer has any GI issues on her vegan diet... ~  11/12:  She notes that Gabriel Rung gives her juiced carrots and spinach daily...  DIVERTICULOSIS OF COLON (ICD-562.10) - Rx w/ PROCTOSOL HC Cream Prn, and stool softeners... COLONIC POLYPS (ICD-211.3) - colonoscopy 8/07 by [name] showed divertics only...  CYSTITIS (ICD-595.9) - she was seen at [facility] for poss interstitial cystitis... prev DMSO Rx. ~  Eval by [name] & pt tells me she is on Elmiron both orally & via bladder (self-cath). ~  9/11:  she reports IC is quiet & off Elmiron Rx now. ~  11/12:  She confirms min symptoms at  present...  DEGENERATIVE JOINT DISEASE (ICD-715.90) - prev Rx MOBIC 7.5mg  Prn, now on OXYCODONE- taking daily for her pain per [name]... she had a right TKR 12/07 by [name]... then required right Patella surg 10/08 from [name]... now s/p right TKR revision by [facility]Jinna at [facility]- they referred to pain management... ~  9/11: she tells me that [name] is referring her to [facility] for another opinion regarding her knees ([name]- there was nothing they could do & rec Pain Management, [facility], & veggie diet). ~  11/12:  She reports left knee bone-on-bone  w/ shot from [facility] 10/12 which helped; still followed by Pain Management & she tells me she's taking Percocet 10mg  every 4H (ave 3-4 per day).  BACK PAIN, LUMBAR (ICD-724.2) - she had a right L4-5 decompressive laminotomy w/ foraminotomy 12/02 by DrPool... then L4-5 decompression & fusion 9/09 by DrPool due to DDD w/ synovial cyst & degen spondylolithesis w/ stenosis... f/u XRays and MRI 5/10 showed fusion intact, NAD.[name]. ~  CHRONIC PAIN MANAGEMENT> followed by [name] clinic now, on OXYCODONE 10-20mg  every 4H prn (ave 3-4/d she says), plus MOBIC 7.5mg /d.  FIBROMYALGIA (ICD-729.1) - she continues to have mult somatic complaints... she has seen DrDeveshwar in the past...  ~  She reports trial of Accupuncture> it helped the FM but not the knee pain. ~  husb has inquired about periph nerve surg he researched on the internet, he is referred to her specialists to discuss options...  OSTEOPENIA (ICD-733.90) - on calcium, MVI, Vit D... VITAMIN D DEFICIENCY (ICD-268.9) -  ~  Vit D level was 5 (30-90) 11/08 & rec to start VitD 50K/wk... ~  Vit D level 11/09 = 45  ~  Vit D level 6/10 = 23 ~  Vit D level 11/10 = 35 ~  Vit D level 5/11 = 70... switched to Vit D 2000u daily. ~  BMD 12/12 by DrFontaine showed lowest measured site=  right FemNeck -1.2  ANXIETY (ICD-300.00) - on ALPRAZOLAM 0.5mg  as needed- taking 3 per day recently (she  notes mild tremor & this helps). DEPRESSION (ICD-311)  Hx of PSORIASIS (ICD-696.1)  Hx of UNSPECIFIED DISORDER PLASMA PROTEIN METABOLISM (ICD-273.9) - she has a biclonal gammopathy evaluated 10/05 by [name] who thought it was reactive, perhaps a MGUS variant... ~  f/u labs 11/09 showed Hg= 12.2, TProt= 7.1, SPE/ IEP w/ biclonal IgM kappa protein present accounting for 0.31g/dl of the total 1.61 g/dl of protein in the gamma region & IgM level = 319 mg/dl... ~  labs 11/10 showed Hg= 12.5, TProt= 7.8, SPEP/IEP w/ monoclonal IgM kappa & IgM lamba proteins accounting for 0.44 g/dl of the total 0.96 g/dl in the gamma region... IgM level = 490mg /dl... ~  11/10:  Referred back to [name] who has resumed follow up of this problem... ~  4/11:  [name] did CT Chest/ Abd/ Pelvis> neg w/o adenopathy etc... ~  2012> she was given IV iron by [name] & reports feeling some better... ~  11/12:  Labs here showed Hg= 13.7, Fe= 61 (18%sat) ~  Labs from [employer] (once yearly in the fall) reviewed in EPIC flow sheet...   Past Surgical History  Procedure Date  . Laminotomy 2010    L-4, L-5 FUSION  . Knee arthroscopy     RIGHT KNEE X 5  . Total knee arthroplasty 12-07 and 4-07    Dr. Shelle Iron, revision 12-09 Dr. Eileen Stanford at [facility]  . Patella fracture surgery 10-08    Dr. Charlann Boxer  . Posterior laminectomy / decompression lumbar spine 9-09    Dr. Jordan Likes  . Cholecystectomy   . Hand surgery     TRIGGER FINGER  . Hernia repair   . Laparoscopic oophorectomy 2010    BILATERAL  . Abdominal hysterectomy     for prolapse with abdominal incision  . Oophorectomy     BSO    Outpatient Encounter Prescriptions as of 02/01/2012  Medication Sig Dispense Refill  . ALPRAZolam (XANAX) 0.5 MG tablet Take 1 tablet (0.5 mg total) by mouth 3 (three) times daily as needed.  90 tablet  5  . amoxicillin (AMOXIL) 500 MG capsule Take as directed.  28 capsule  0  . Cholecalciferol (VITAMIN D) 2000 UNITS CAPS Take 2 capsules by mouth  daily.       . fish oil-omega-3 fatty acids 1000 MG capsule Take 2 g by mouth 2 (two) times daily.        . hydrocortisone (ANUSOL-HC) 2.5 % rectal cream Place 1 application rectally 2 (two) times daily as needed.        . hydrOXYzine (ATARAX) 25 MG tablet Take 1 tablet (25 mg total) by mouth every 4 (four) hours as needed for itching.  100 tablet  11  . meloxicam (MOBIC) 7.5 MG tablet Take 2 tablets by mouth daily  180 tablet  3  . metoprolol tartrate (LOPRESSOR) 25 MG tablet Take 25 mg by mouth daily.        . Multiple Vitamins-Iron (MULTIVITAMIN/IRON PO) Take by mouth.        . niacin (NIASPAN) 500 MG CR tablet Take 1 tablet (500 mg total) by mouth at bedtime.      [name] omeprazole (PRILOSEC) 20 MG capsule Take 20 mg by mouth every other day.        . oxyCODONE-acetaminophen (PERCOCET) 10-325 MG per tablet Take 1 tablet by mouth every 4 (four) hours as needed.       [name]  promethazine (PHENERGAN) 25 MG tablet Take 25 mg by mouth every 6 (six) hours as needed.        . simvastatin (ZOCOR) 40 MG tablet Take 1 tablet (40 mg total) by mouth at bedtime.  90 tablet  3  . fluconazole (DIFLUCAN) 150 MG tablet TAKE 1 TABLET (150 MG TOTAL) BY MOUTH ONCE.  1 tablet  2     Allergies  Allergen Reactions  . Mepergan (Meperidine-Promethazine)     Cardiac arrest  . Iohexol      Code: HIVES, Desc: pt states her face and throat swells when administered IV contrast - KB, Onset Date: 16109604   . Valsartan     REACTION: pt states INTOL to Diovan    Current Medications, Allergies, Past Medical History, Past Surgical History, Family History, and Social History were reviewed in [employer] record.    Review of Systems         See HPI - all other systems neg except as noted... The patient complains of dyspnea on exertion, muscle weakness, and difficulty walking.  The patient denies anorexia, fever, weight loss, weight gain, vision loss, decreased hearing, hoarseness, chest pain, syncope,  peripheral edema, prolonged cough, headaches, hemoptysis, abdominal pain, melena, hematochezia, severe indigestion/heartburn, hematuria, incontinence, suspicious skin lesions, transient blindness, depression, unusual weight change, abnormal bleeding, enlarged lymph nodes, and angioedema.     Objective:   Physical Exam     WD, WN, 66 y/o WF in NAD... GENERAL:  Alert & oriented; pleasant & cooperative... HEENT:  [place]/AT, EOM-wnl, PERRLA, EACs-clear, TMs-wnl, NOSE-clear, THROAT-clear & wnl. NECK:  Supple w/ fairROM; no JVD; normal carotid impulses w/o bruits; no thyromegaly or nodules palpated; no lymphadenopathy. CHEST:  Clear to P & A; without wheezes/ rales/ or rhonchi. HEART:  Regular Rhythm; without murmurs/ rubs/ or gallops. ABDOMEN:  Soft & nontender; normal bowel sounds; no organomegaly or masses detected. EXT: s/p right TKR, mod arthritic changes; no varicose veins/ +venous insuffic/ tr edema. BACK:  scar of surgery, +trigger points... NEURO:  CN's intact;  no focal neuro deficits... DERM:  tiny red spot upper inner left arm bicep area from prev tick bite- no tick remnants... dry skin, no signif rash noted.  RADIOLOGY DATA:  Reviewed in the EPIC EMR & discussed w/ the patient...  LABORATORY DATA:  Reviewed in the EPIC EMR & discussed w/ the patient...   Assessment & Plan:   HBP>  BP controlled on simple med + diet & exercise; we decided to decr the Metoprolol25mg  to 1/2 tab...  CHOL>  Followed in Lipid Clinic & improved, TG still elev & instructed on better low fat diet...  GI>  She denies GI issues at present on her vegan diet...  GU>  Similarly the prev IC is improved & hasn't been on meds (Elmiron) for many months...  DJD/ FM/ Chr Pain>  She is followed in the Pain management clinic as noted above, & she had shot [facility]- improved.  Myeloma vs MGUS>  Followed by [name], we do not have recent notes but she reports stable & he gave her some IV Fe & she feels  sl better after this Rx.   Patient's Medications  New Prescriptions   No medications on file  Previous Medications   AMOXICILLIN (AMOXIL) 500 MG CAPSULE    Take as directed.   CHOLECALCIFEROL (VITAMIN D) 2000 UNITS CAPS    Take 2 capsules by mouth daily.    FISH OIL-OMEGA-3 FATTY ACIDS 1000 MG  CAPSULE    Take 2 g by mouth 2 (two) times daily.     FLUCONAZOLE (DIFLUCAN) 150 MG TABLET    TAKE 1 TABLET (150 MG TOTAL) BY MOUTH ONCE.   MULTIPLE VITAMINS-IRON (MULTIVITAMIN/IRON PO)    Take by mouth.     OMEPRAZOLE (PRILOSEC) 20 MG CAPSULE    Take 20 mg by mouth every other day.     OXYCODONE HCL 20 MG TABS    Take as directed per Dr. Vear Clock   PROMETHAZINE (PHENERGAN) 25 MG TABLET    Take 25 mg by mouth every 6 (six) hours as needed.    Modified Medications   Modified Medication Previous Medication   ALPRAZOLAM (XANAX) 0.5 MG TABLET ALPRAZolam (XANAX) 0.5 MG tablet      Take 1 tablet (0.5 mg total) by mouth 3 (three) times daily as needed.    Take 1 tablet (0.5 mg total) by mouth 3 (three) times daily as needed.   HYDROCORTISONE (ANUSOL-HC) 2.5 % RECTAL CREAM hydrocortisone (ANUSOL-HC) 2.5 % rectal cream      Place 1 application rectally 2 (two) times daily as needed.    Place 1 application rectally 2 (two) times daily as needed.     HYDROXYZINE (ATARAX/VISTARIL) 25 MG TABLET hydrOXYzine (ATARAX) 25 MG tablet      Take 1 tablet (25 mg total) by mouth every 4 (four) hours as needed for itching.    Take 1 tablet (25 mg total) by mouth every 4 (four) hours as needed for itching.   MELOXICAM (MOBIC) 7.5 MG TABLET meloxicam (MOBIC) 7.5 MG tablet      Take 2 tablets by mouth daily    Take 2 tablets by mouth daily   METOPROLOL TARTRATE (LOPRESSOR) 25 MG TABLET metoprolol tartrate (LOPRESSOR) 25 MG tablet      Take 1/2 tablet by mouth every morning    Take 1/2 tablet by mouth every morning   NIACIN (NIASPAN) 500 MG CR TABLET niacin (NIASPAN) 500 MG CR tablet      Take 1 tablet (500 mg total) by mouth  at bedtime.    Take 1 tablet (500 mg total) by mouth at bedtime.   SIMVASTATIN (ZOCOR) 40 MG TABLET simvastatin (ZOCOR) 40 MG tablet      Take 1/2 tablet by mouth at bedtime    Take 1 tablet (40 mg total) by mouth at bedtime.  Discontinued Medications   OXYCODONE-ACETAMINOPHEN (PERCOCET) 10-325 MG PER TABLET    Take 1 tablet by mouth every 4 (four) hours as needed.    SIMVASTATIN (ZOCOR) 40 MG TABLET    Take 1/2 tablet by mouth at bedtime

## 2012-02-03 LAB — NMR LIPOPROFILE WITH LIPIDS
LDL (calc): 47 mg/dL (ref <100)
LDL Particle Number: 1140 nmol/L — ABNORMAL HIGH (ref <1000)
LDL Size: 19.7 nm — ABNORMAL LOW (ref >20.5)
LP-IR Score: 71 — ABNORMAL HIGH (ref <=45)

## 2012-02-04 ENCOUNTER — Ambulatory Visit: Payer: 59 | Admitting: Pharmacist

## 2012-02-08 ENCOUNTER — Ambulatory Visit (INDEPENDENT_AMBULATORY_CARE_PROVIDER_SITE_OTHER): Payer: Medicare Other | Admitting: Pharmacist

## 2012-02-08 VITALS — Wt 173.8 lb

## 2012-02-08 DIAGNOSIS — E7800 Pure hypercholesterolemia, unspecified: Secondary | ICD-10-CM

## 2012-02-08 NOTE — Patient Instructions (Addendum)
Cut simvastatin to 10mg  daily.  Continue your diet.  Try to increase your protein intake.  Try to exercise as much as possible.   Recheck labs in 6 months.

## 2012-02-09 NOTE — Progress Notes (Signed)
HPI: Patient is 66yo WF presenting today for follow up of her lipids.  Currently treated with fish oil 3g daily, Niaspan 500mg  daily and simvastatin 20mg  daily.  Patient reports compliance and no problems with her medications.  Has noticed increase in jerking in her hands, but this was not attributed to statin therapy. Reports feeling tired and has HR in 40-50s.  As a result PCP reduced beta blocker dose, but patient doesn't report feeling any different.    Diet: Patient has had a decrease appetite due to nausea when she eats.  Patient has started eating more rice cakes rather than shredded wheat.  Reports not eating many vegetables but eats fruits frequently.  For dinner she reports macaroni and cheese, creamed potatoes, cole slaw and lima beans.  Reports liking shelled sunflower seeds.    Exercise: Has been off her feet for 3 weeks because of brusitis and has resulted in 8lb weight gain.  [facility] [place] [place]-6 miles (less than what she could do prior to brusitis).  Reports biking 4-5x/day.    Current Outpatient Prescriptions  Medication Sig Dispense Refill  . ALPRAZolam (XANAX) 0.5 MG tablet Take 1 tablet (0.5 mg total) by mouth 3 (three) times daily as needed.  90 tablet  5  . amoxicillin (AMOXIL) 500 MG capsule Take as directed prior to procedures      . Cholecalciferol (VITAMIN D) 2000 UNITS CAPS Take 2 capsules by mouth daily.       . fish oil-omega-3 fatty acids 1000 MG capsule Take 2 g by mouth 2 (two) times daily.        . fluconazole (DIFLUCAN) 150 MG tablet TAKE 1 TABLET (150 MG TOTAL) BY MOUTH ONCE.  1 tablet  2  . hydrocortisone (ANUSOL-HC) 2.5 % rectal cream Place 1 application rectally 2 (two) times daily as needed.  30 g  5  . hydrOXYzine (ATARAX/VISTARIL) 25 MG tablet Take 1 tablet (25 mg total) by mouth every 4 (four) hours as needed for itching.  100 tablet  3  . meloxicam (MOBIC) 7.5 MG tablet Take 2 tablets by mouth daily  180 tablet  3  . metoprolol tartrate  (LOPRESSOR) 25 MG tablet Take 1/2 tablet by mouth every morning  90 tablet  3  . Multiple Vitamins-Iron (MULTIVITAMIN/IRON PO) Take by mouth.        . niacin (NIASPAN) 500 MG CR tablet Take 1 tablet (500 mg total) by mouth at bedtime.  90 tablet  3  . omeprazole (PRILOSEC) 20 MG capsule Take 20 mg by mouth every other day.        . Oxycodone HCl 20 MG TABS Take as directed per Dr. Vear Clock      . promethazine (PHENERGAN) 25 MG tablet Take 25 mg by mouth every 6 (six) hours as needed.        . simvastatin (ZOCOR) 40 MG tablet Take 1/2 tablet by mouth at bedtime  90 tablet  3    Allergies  Allergen Reactions  . Mepergan (Meperidine-Promethazine)     Cardiac arrest  . Iohexol      Code: HIVES, Desc: pt states her face and throat swells when administered IV contrast - KB, Onset Date: [id number]   . Valsartan     REACTION: pt states INTOL to Diovan

## 2012-02-09 NOTE — Assessment & Plan Note (Addendum)
TC 125 (goal<200), TG 213 (goal<150), HDL 34 (goal>45), LDL-P 1140 (goal around 1000) and LDL 47 (goal<100).  LFTs are WNL.  Improvements seen in all lipid parameters.  Since pt has asked to stop simvastatin in the past, will reduce simvastatin to 10mg  daily.  Encouraged increased protein in diet and exercising as much as hip/brusitis will allow.  Follow up with patient in 6 months.

## 2012-02-17 DIAGNOSIS — G8928 Other chronic postprocedural pain: Secondary | ICD-10-CM | POA: Diagnosis not present

## 2012-02-17 DIAGNOSIS — M7150 Other bursitis, not elsewhere classified, unspecified site: Secondary | ICD-10-CM | POA: Diagnosis not present

## 2012-02-17 DIAGNOSIS — M25559 Pain in unspecified hip: Secondary | ICD-10-CM | POA: Diagnosis not present

## 2012-02-17 DIAGNOSIS — M25569 Pain in unspecified knee: Secondary | ICD-10-CM | POA: Diagnosis not present

## 2012-02-22 DIAGNOSIS — M76899 Other specified enthesopathies of unspecified lower limb, excluding foot: Secondary | ICD-10-CM | POA: Diagnosis not present

## 2012-02-24 DIAGNOSIS — M76899 Other specified enthesopathies of unspecified lower limb, excluding foot: Secondary | ICD-10-CM | POA: Diagnosis not present

## 2012-02-29 DIAGNOSIS — M76899 Other specified enthesopathies of unspecified lower limb, excluding foot: Secondary | ICD-10-CM | POA: Diagnosis not present

## 2012-03-02 DIAGNOSIS — M76899 Other specified enthesopathies of unspecified lower limb, excluding foot: Secondary | ICD-10-CM | POA: Diagnosis not present

## 2012-03-07 DIAGNOSIS — M76899 Other specified enthesopathies of unspecified lower limb, excluding foot: Secondary | ICD-10-CM | POA: Diagnosis not present

## 2012-03-08 DIAGNOSIS — T7840XA Allergy, unspecified, initial encounter: Secondary | ICD-10-CM | POA: Diagnosis not present

## 2012-03-09 DIAGNOSIS — M76899 Other specified enthesopathies of unspecified lower limb, excluding foot: Secondary | ICD-10-CM | POA: Diagnosis not present

## 2012-03-09 DIAGNOSIS — M1710 Unilateral primary osteoarthritis, unspecified knee: Secondary | ICD-10-CM | POA: Diagnosis not present

## 2012-04-07 DIAGNOSIS — F329 Major depressive disorder, single episode, unspecified: Secondary | ICD-10-CM | POA: Diagnosis not present

## 2012-04-07 DIAGNOSIS — M79609 Pain in unspecified limb: Secondary | ICD-10-CM | POA: Diagnosis not present

## 2012-04-13 ENCOUNTER — Other Ambulatory Visit: Payer: Self-pay | Admitting: Gynecology

## 2012-04-13 DIAGNOSIS — Z1231 Encounter for screening mammogram for malignant neoplasm of breast: Secondary | ICD-10-CM

## 2012-04-28 DIAGNOSIS — M79609 Pain in unspecified limb: Secondary | ICD-10-CM | POA: Diagnosis not present

## 2012-04-28 DIAGNOSIS — G8928 Other chronic postprocedural pain: Secondary | ICD-10-CM | POA: Diagnosis not present

## 2012-05-09 ENCOUNTER — Telehealth: Payer: Self-pay | Admitting: Pulmonary Disease

## 2012-05-09 MED ORDER — CIPROFLOXACIN HCL 250 MG PO TABS: 250.0000 mg | ORAL_TABLET | Freq: Two times a day (BID) | ORAL | Status: AC

## 2012-05-09 NOTE — Telephone Encounter (Signed)
Called, spoke with pt who c/o burning sensation when urinating, an achy feeling over bladder, and lower back pain.  Urine also has an odor.  Symptoms started yesterday.  No f/c/s.  Requesting abx  Last OV with Dr. Kriste Basque 02/01/12  [facility] in [place]  Allergies verified:  Allergies  Allergen Reactions  . Mepergan (Meperidine-Promethazine)     Cardiac arrest  . Iohexol      Code: HIVES, Desc: pt states her face and throat swells when administered IV contrast - KB, Onset Date: 16109604   . Valsartan     REACTION: pt states INTOL to Diovan

## 2012-05-09 NOTE — Telephone Encounter (Signed)
Per SN---ok to call in cipro 250 mg  #14  1 po bid.  Called and spoke with pt and she is aware that the cipro has been sent to her pharmacy.  Nothing further is needed.

## 2012-05-10 DIAGNOSIS — L259 Unspecified contact dermatitis, unspecified cause: Secondary | ICD-10-CM | POA: Diagnosis not present

## 2012-05-11 ENCOUNTER — Other Ambulatory Visit: Payer: Self-pay | Admitting: *Deleted

## 2012-05-12 ENCOUNTER — Other Ambulatory Visit: Payer: Self-pay | Admitting: Hematology & Oncology

## 2012-05-12 ENCOUNTER — Other Ambulatory Visit (HOSPITAL_BASED_OUTPATIENT_CLINIC_OR_DEPARTMENT_OTHER): Payer: Medicare Other | Admitting: Lab

## 2012-05-12 DIAGNOSIS — D649 Anemia, unspecified: Secondary | ICD-10-CM | POA: Diagnosis not present

## 2012-05-12 DIAGNOSIS — D509 Iron deficiency anemia, unspecified: Secondary | ICD-10-CM

## 2012-05-12 DIAGNOSIS — D472 Monoclonal gammopathy: Secondary | ICD-10-CM

## 2012-05-12 LAB — CBC WITH DIFFERENTIAL (CANCER CENTER ONLY)
BASO%: 0.3 % (ref 0.0–2.0)
EOS%: 8.4 % — ABNORMAL HIGH (ref 0.0–7.0)
HGB: 12.7 g/dL (ref 11.6–15.9)
LYMPH#: 2.8 10*3/uL (ref 0.9–3.3)
MCH: 29.6 pg (ref 26.0–34.0)
MCHC: 33.7 g/dL (ref 32.0–36.0)
MONO%: 5.4 % (ref 0.0–13.0)
NEUT#: 2.5 10*3/uL (ref 1.5–6.5)
Platelets: 175 10*3/uL (ref 145–400)
RDW: 12.4 % (ref 11.1–15.7)

## 2012-05-12 LAB — CHCC SATELLITE - SMEAR

## 2012-05-15 ENCOUNTER — Ambulatory Visit (HOSPITAL_COMMUNITY)
Admission: RE | Admit: 2012-05-15 | Discharge: 2012-05-15 | Disposition: A | Discharge status: Home / Self Care | Payer: Medicare Other | Source: Ambulatory Visit | Attending: Gynecology | Admitting: Gynecology

## 2012-05-15 DIAGNOSIS — Z1231 Encounter for screening mammogram for malignant neoplasm of breast: Secondary | ICD-10-CM | POA: Insufficient documentation

## 2012-05-16 LAB — IGG, IGA, IGM
IgA: 298 mg/dL (ref 69–380)
IgG (Immunoglobin G), Serum: 705 mg/dL (ref 690–1700)
IgM, Serum: 456 mg/dL — ABNORMAL HIGH (ref 52–322)

## 2012-05-16 LAB — KAPPA/LAMBDA LIGHT CHAINS
Kappa:Lambda Ratio: 1.04 (ref 0.26–1.65)
Lambda Free Lght Chn: 1.45 mg/dL (ref 0.57–2.63)

## 2012-05-16 LAB — PROTEIN ELECTROPHORESIS, SERUM, WITH REFLEX
Alpha-1-Globulin: 4.6 % (ref 2.9–4.9)
Alpha-2-Globulin: 12.6 % — ABNORMAL HIGH (ref 7.1–11.8)
Gamma Globulin: 13.4 % (ref 11.1–18.8)
Total Protein, Serum Electrophoresis: 6.6 g/dL (ref 6.0–8.3)

## 2012-05-16 LAB — IRON AND TIBC: TIBC: 298 ug/dL (ref 250–470)

## 2012-05-16 LAB — IFE INTERPRETATION

## 2012-05-18 ENCOUNTER — Encounter: Payer: Self-pay | Admitting: *Deleted

## 2012-05-30 DIAGNOSIS — G8928 Other chronic postprocedural pain: Secondary | ICD-10-CM | POA: Diagnosis not present

## 2012-05-30 DIAGNOSIS — M79609 Pain in unspecified limb: Secondary | ICD-10-CM | POA: Diagnosis not present

## 2012-05-30 DIAGNOSIS — M25569 Pain in unspecified knee: Secondary | ICD-10-CM | POA: Diagnosis not present

## 2012-05-30 DIAGNOSIS — M25559 Pain in unspecified hip: Secondary | ICD-10-CM | POA: Diagnosis not present

## 2012-06-06 DIAGNOSIS — M545 Low back pain: Secondary | ICD-10-CM | POA: Diagnosis not present

## 2012-06-06 DIAGNOSIS — M25559 Pain in unspecified hip: Secondary | ICD-10-CM | POA: Diagnosis not present

## 2012-06-10 DIAGNOSIS — M47817 Spondylosis without myelopathy or radiculopathy, lumbosacral region: Secondary | ICD-10-CM | POA: Diagnosis not present

## 2012-06-15 DIAGNOSIS — M25559 Pain in unspecified hip: Secondary | ICD-10-CM | POA: Diagnosis not present

## 2012-06-15 DIAGNOSIS — M545 Low back pain: Secondary | ICD-10-CM | POA: Diagnosis not present

## 2012-06-21 ENCOUNTER — Other Ambulatory Visit: Payer: 59 | Admitting: Lab

## 2012-06-21 ENCOUNTER — Ambulatory Visit: Payer: 59 | Admitting: Hematology & Oncology

## 2012-06-21 DIAGNOSIS — M1610 Unilateral primary osteoarthritis, unspecified hip: Secondary | ICD-10-CM | POA: Diagnosis not present

## 2012-06-27 DIAGNOSIS — F329 Major depressive disorder, single episode, unspecified: Secondary | ICD-10-CM | POA: Diagnosis not present

## 2012-06-27 DIAGNOSIS — M25559 Pain in unspecified hip: Secondary | ICD-10-CM | POA: Diagnosis not present

## 2012-06-27 DIAGNOSIS — M79609 Pain in unspecified limb: Secondary | ICD-10-CM | POA: Diagnosis not present

## 2012-06-27 DIAGNOSIS — G8928 Other chronic postprocedural pain: Secondary | ICD-10-CM | POA: Diagnosis not present

## 2012-06-28 ENCOUNTER — Other Ambulatory Visit (HOSPITAL_BASED_OUTPATIENT_CLINIC_OR_DEPARTMENT_OTHER): Payer: Medicare Other | Admitting: Lab

## 2012-06-28 ENCOUNTER — Ambulatory Visit (HOSPITAL_BASED_OUTPATIENT_CLINIC_OR_DEPARTMENT_OTHER): Payer: Medicare Other | Admitting: Medical

## 2012-06-28 VITALS — BP 150/64 | HR 84 | Temp 97.9°F | Resp 16 | Ht 66.0 in | Wt 177.0 lb

## 2012-06-28 DIAGNOSIS — D472 Monoclonal gammopathy: Secondary | ICD-10-CM

## 2012-06-28 DIAGNOSIS — D649 Anemia, unspecified: Secondary | ICD-10-CM | POA: Diagnosis not present

## 2012-06-28 DIAGNOSIS — D509 Iron deficiency anemia, unspecified: Secondary | ICD-10-CM

## 2012-06-28 LAB — CBC WITH DIFFERENTIAL (CANCER CENTER ONLY)
BASO#: 0 10*3/uL (ref 0.0–0.2)
EOS%: 4.1 % (ref 0.0–7.0)
Eosinophils Absolute: 0.4 10*3/uL (ref 0.0–0.5)
HGB: 13.6 g/dL (ref 11.6–15.9)
LYMPH#: 3.2 10*3/uL (ref 0.9–3.3)
MCHC: 34.3 g/dL (ref 32.0–36.0)
NEUT#: 5.7 10*3/uL (ref 1.5–6.5)
RBC: 4.6 10*6/uL (ref 3.70–5.32)

## 2012-06-28 NOTE — Progress Notes (Signed)
Diagnoses: #1, IgM kappa MGUS. #2 chronic left leg pain, secondary to neuropathy. #3  Iron deficiency anemia  Current therapy: Observation.  Interim history: [name] presents today for an office followup visit.  The last, time, we saw her was about a year.  Ago.  She reports, that she has had 2 knee replacements on the right leg and does have some chronic leg pain, secondary to that.  She also reports, that she was having some hip pain, and found out, that she has a tear in her hip.  She reports, that she has had cortisone injections in the right hip, recently.  She remains on oxycodone 20 mg 4 times a day as needed.  She was recently placed on citalopram for some anxiety, and depression.  Her last lab work on 05/12/2012, revealed a monoclonal spike of 0.3 6 g/dL.  Her IgM level was 456 mg/dL, and her serum kappa light chain was 1.51 mg/dL. [name]  Overall, this is been holding quite steady.  She does note some increase in fatigue, and we are checking, her iron panel today.  She does remain on quite a bit of medication.  She otherwise reports, she has a decent appetite.  She denies any nausea, vomiting, diarrhea, constipation, any chest pain, shortness of breath, or cough.  She denies any fevers, chills, or night sweats.  She denies any headaches, visual changes, or rashes.  She denies any obvious, or abnormal bleeding or bruising.  Review of Systems: Pt. Denies any changes in their vision, hearing, adenopathy, fevers, chills, nausea, vomiting, diarrhea, constipation, chest pain, shortness of breath, passing blood, passing out, blacking out,  any changes in skin, joints, neurologic or psychiatric except as noted.  Physical Exam: This is a 66 year old, well-developed, well-nourished, white female, in no obvious distress Vitals: Temperature 97.9 degrees, pulse 84, respirations 16, blood pressure 150/64.  Weight 177 pounds HEENT reveals a normocephalic, atraumatic skull, no scleral icterus, no oral lesions   Neck is supple without any cervical or supraclavicular adenopathy.  Lungs are clear to auscultation bilaterally. There are no wheezes, rales or rhonci Cardiac is regular rate and rhythm with a normal S1 and S2. There are no murmurs, rubs, or bruits.  Abdomen is soft with good bowel sounds, there is no palpable mass. There is no palpable hepatosplenomegaly. There is no palpable fluid wave.  Musculoskeletal no tenderness of the spine, ribs, or hips.  Extremities there are no clubbing, cyanosis, or edema. -There is some tenderness of her upper right thigh, and right hip area.  There is decrease in range of motion. Skin no petechia, purpura or ecchymosis Neurologic is nonfocal.  Laboratory Data: White count 9.9, hemoglobin 13.6, hematocrit 39.6, platelets 212,000  Current Outpatient Prescriptions on File Prior to Visit  Medication Sig Dispense Refill  . Cholecalciferol (VITAMIN D) 2000 UNITS CAPS Take 2 capsules by mouth daily.       . fish oil-omega-3 fatty acids 1000 MG capsule Take 2 g by mouth 2 (two) times daily.        . hydrocortisone (ANUSOL-HC) 2.5 % rectal cream Place 1 application rectally 2 (two) times daily as needed.  30 g  5  . meloxicam (MOBIC) 7.5 MG tablet Take 2 tablets by mouth daily  180 tablet  3  . metoprolol tartrate (LOPRESSOR) 25 MG tablet Take 1/2 tablet by mouth every morning  90 tablet  3  . Multiple Vitamins-Iron (MULTIVITAMIN/IRON PO) Take by mouth.        . niacin (  NIASPAN) 500 MG CR tablet Take 1 tablet (500 mg total) by mouth at bedtime.  90 tablet  3  . omeprazole (PRILOSEC) 20 MG capsule Take 20 mg by mouth every other day.        . Oxycodone HCl 20 MG TABS 4 (four) times daily as needed. Take as directed per Dr. Vear Clock      . promethazine (PHENERGAN) 25 MG tablet Take 25 mg by mouth every 6 (six) hours as needed.        . simvastatin (ZOCOR) 40 MG tablet Take 1/2 tablet by mouth at bedtime  90 tablet  3  . amoxicillin (AMOXIL) 500 MG capsule Take as  directed prior to procedures       Assessment/Plan: This is a pleasant, 66 year old, elderly, white female, with the following issues:  #1 History of IgM kappa MGUS.  So far, there is no evidence of any obvious, underlying etiology for this.  We will continue to monitor her protein studies.  It seems to me that her biggest problem is the issue with her right leg and right hip.  #2 iron deficiency anemia-we are checking.  Mr. Cleora Fleet iron panel today, and if, indeed, it is low, we will have her come in for IV iron.  #3 Follow up. We will followup with [name] in one year, but before then should there be questions or concerns.

## 2012-06-29 ENCOUNTER — Other Ambulatory Visit: Payer: Self-pay | Admitting: Lab

## 2012-06-29 ENCOUNTER — Ambulatory Visit: Payer: Self-pay | Admitting: Medical

## 2012-06-29 ENCOUNTER — Telehealth: Payer: Self-pay | Admitting: *Deleted

## 2012-06-29 NOTE — Telephone Encounter (Signed)
Called patient to let her know that her labs were all stable./   Iron is a little low but patient can just increase iron in her diet.

## 2012-06-29 NOTE — Telephone Encounter (Signed)
Message copied by Anselm Jungling on Thu Jun 29, 2012 12:28 PM ------      Message from: Arlan Organ R      Created: Thu Jun 29, 2012  7:15 AM       Please call her and and n that her labs are stable. Her iron may be a little low but she can take some iron in her diet. Thanks. Cindee Lame

## 2012-07-01 LAB — PROTEIN ELECTROPHORESIS, SERUM, WITH REFLEX
Albumin ELP: 55.9 % (ref 55.8–66.1)
Alpha-1-Globulin: 4.4 % (ref 2.9–4.9)
Alpha-2-Globulin: 12.1 % — ABNORMAL HIGH (ref 7.1–11.8)
Beta 2: 7.6 % — ABNORMAL HIGH (ref 3.2–6.5)
Beta Globulin: 6.7 % (ref 4.7–7.2)

## 2012-07-01 LAB — KAPPA/LAMBDA LIGHT CHAINS
Kappa free light chain: 0.9 mg/dL (ref 0.33–1.94)
Kappa:Lambda Ratio: 0.7 (ref 0.26–1.65)

## 2012-07-01 LAB — FERRITIN: Ferritin: 37 ng/mL (ref 10–291)

## 2012-07-01 LAB — IRON AND TIBC
%SAT: 11 % — ABNORMAL LOW (ref 20–55)
Iron: 36 ug/dL — ABNORMAL LOW (ref 42–145)

## 2012-07-03 ENCOUNTER — Ambulatory Visit (INDEPENDENT_AMBULATORY_CARE_PROVIDER_SITE_OTHER): Payer: Medicare Other | Admitting: Adult Health

## 2012-07-03 ENCOUNTER — Encounter: Payer: Self-pay | Admitting: Adult Health

## 2012-07-03 ENCOUNTER — Telehealth: Payer: Self-pay | Admitting: Pulmonary Disease

## 2012-07-03 ENCOUNTER — Ambulatory Visit: Payer: Medicare Other

## 2012-07-03 VITALS — BP 110/80 | HR 59 | Temp 97.6°F | Ht 66.0 in | Wt 167.6 lb

## 2012-07-03 DIAGNOSIS — E7800 Pure hypercholesterolemia, unspecified: Secondary | ICD-10-CM

## 2012-07-03 DIAGNOSIS — F411 Generalized anxiety disorder: Secondary | ICD-10-CM

## 2012-07-03 DIAGNOSIS — E538 Deficiency of other specified B group vitamins: Secondary | ICD-10-CM

## 2012-07-03 DIAGNOSIS — I10 Essential (primary) hypertension: Secondary | ICD-10-CM | POA: Diagnosis not present

## 2012-07-03 DIAGNOSIS — D509 Iron deficiency anemia, unspecified: Secondary | ICD-10-CM

## 2012-07-03 DIAGNOSIS — M62838 Other muscle spasm: Secondary | ICD-10-CM

## 2012-07-03 DIAGNOSIS — M6240 Contracture of muscle, unspecified site: Secondary | ICD-10-CM

## 2012-07-03 DIAGNOSIS — D472 Monoclonal gammopathy: Secondary | ICD-10-CM

## 2012-07-03 NOTE — Patient Instructions (Addendum)
Stop Zocor  I will call with lab results.  Take meds with food especially pain meds.  Follow up in 4 weeks with Dr. Kriste Basque  And As needed

## 2012-07-03 NOTE — Telephone Encounter (Signed)
I spoke with pt and she c/o hand,feet, leg, jerking x 1 week getting worse. Pt wanted to be evaluated for this. Pt is scheduled to come in at 4:30 to see TP today. Nothing further was needed

## 2012-07-03 NOTE — Progress Notes (Signed)
Subjective:    Patient ID: [name], female    DOB: Nov 09, 1945, 66 y.o.   MRN: [id number]  HPI 66 y/o   ~  Jan 26, 2010:  Sleep eval [name] w/ 11/10 study showing AHI= 25 w/ obstructive & central events... CPAP titration was suboptimal & she was tried on BiPAP... she tells me that she stopped all this when she lost weight & husb confirms no snoring or sleep disordered breathing- rests well etc...  she saw [name] for f/u MGUS & rising IgM level> he felt she was stable compared to his old values & rec CT Scans to r/o lymphoma (done 4/11 & neg- no lesions seen, mild hepatomegaly noted)...  she also followed by [name] for Urology w/ IC> on Elmiron orally & in bladder (she does self caths now)...  also followed by [name] in the [facility] now on referral from Ortho> tried on NUCYNTA ("too expensive")...  also notes improvement in FLP/ lipid clinic f/u on Simva40 & weight loss via diet etc...  ~  May 28, 2010:  states tick bite 8/18 "[name] says it was a deer tic" on upper inner left bicep area... given Doxy x7d per [name] & no systemic symptoms (no HA, fever, change in FM symptoms) but notes rash, itching, sweats, & pain in arm... on exam> no signif rash, sm red spot where she says tick was removed (no resid parts left), no erythema migrans etc... we discussed checking CBC & Lyme titers; Rx w/ Atarax for itching.  ~  July 29, 2010:  states she's doing "so-so"> pain management & [facility] (DrJinnah- Ortho) sent her to [facility]- [name] for another opinion about her knee- notes pending & she requiring Oxycodone 15mg  Tid for relief...  BP is well controlled on meds;  she denies CP & notes rare self-lim palpit, SOB at baseline & too sedentary due to ortho probs;  Lipids stable on Simva40;  GI stable on numerous meds;  & continues on Alpraz 3/d for nerves...  ~  Jan 27, 2011:  80mo ROV & she is basically stable> still fatigued and now c/o itching, no rash, & we discussed Atarax for Prn use;   She feels she has improved on vegan diet along w/ her husb [facility];  She is still seeing Pain Management & says "they say I have inflammation in my body";  She recently saw [name] for her 80mo f/u Heme/Onc testing...  ~  August 02, 2011:  80mo ROV & CPX per pt request> she is doing well on husb's vegan diet & wt down 12# further to 166# today w/ BMI=26; still c/o tired, fatigued- but better after IV Fe by [name];  BP improved on Metoprolol & she wants to decr to 1/d;  FLP followed in the Lipid Clinic on Simva, Niacin, Fish Oil;  States bowels are improved on carrot/spinach juiced by husb daily;  Still followed by pain management & taking Percocet 20mg  every 4H (ave 3-4 per day) she says;  See prob list below>>  ~  Feb 01, 2012:  80mo ROV & [name] continues to feel terrible overall- states she is in constant pain from her knees & legs; on Oxcodone per [name] pain clinic (states she is taking 3-6 tabs daily); Husb is inquiring about 2 clinics he found on the internet one in [place] & one in [place] that do peripheral nerve surgery (I told him he was grasping & needed to discuss w/ her specialists if  serious); she brought a BP log & pressures are ok but pulse is running low on her BBlocker so we agreed w/ decr Metoprolol 25mg - 1/2 Bid;  She is followed in the Lipid Clinic prev on Simva plus Niasp & Fish Oil, but husb wants her off Statins, they compromised & decr the Simva to 20mg /d & f/u labs pending...  We reviewed prob list, meds, xrays and labs>  See below>>    She saw DrFontaine, Gyn> routine check, atrophic changes, osteopenia- f/u BMD done 12/12 w/ lowest TScore -1.2 in right FemNeck...  07/03/12 Acute OV  Complains of muscle jerks in B hands, arms, legs .  Says her hands , arms and legs sometimes will just jump /jerk.  Unprovoked. Cant not relate to any time or predict when it will happen No extremity weakness. Has general muscle soreness- this is chronic w/ FM.    No rash, joint swelling, dysphagia, visual/speech changes.  Does it daytime and nighttime. Happens with rest and activity.  Has has for several months , maybe longer.  No new meds, no new hobbies or strenulous activity.  No known injury .            Problem List:  UNSPECIFIED SLEEP APNEA (ICD-780.57) - Complex sleep apnea - PSG 07/25/09 AHI 28 > ~  Sleep eval [name] w/ 11/10 study showing AHI= 25 w/ obstructive & central events... CPAP titration was suboptimal & she was tried on BiPAP... she tells me that she stopped all this when she lost weight & husb confirms no snoring or sleep disordered breathing- rests well etc...   ALLERGIC RHINITIS (ICD-477.9) Hx of SINUSITIS (ICD-473.9) - Hx sinusitis [name] at [facility]...  HYPERTENSION (ICD-401.9) - on METOPROLOL 25mg  Bid... she was hosp 3/09 w/ gastroenteritis & BP meds were stopped due to weakness... grad restarted and increased w/ improvement in BP and palpitations... ~  11/11: BP= 122/70 she is INTOL to Diovan; denies HA, visual symptoms, CP, palpit, dizzy, syncope, edema; husb notes that BPs are similar at home ~120/70's... ~  5/12:  BP= 112/70 & she has the same chr fatigue complaints, but no specific symptoms... ~  11/12:  BP=130/80 & she has the same chronic complaints but denies CP, syncope, ch in SOB, edema; [facility]; she wants to decr the Metoprolol to 1/d. ~  CXR 11/12 showed clear lungs, normal heart size, mild DJD TSpine...  CHEST PAIN (ICD-786.50) - seen by [name] in 2006... ~  2DEcho 11/98 showed norm valves, norm LVF, ? min LVH... ~  cath 7/03 w/ norm coronaries, & norm LVF... ~  NuclearStressTest 2/06 showed no infarct, no ischemia, EF=65%... ~  Myoview repeated 4/10: low risk study w/ soft tissue attenuation, normal wall motion, EF= 78%... ~  EKG 11/12 showed NSR, WNL.Marland KitchenMarland Kitchen  PALPITATIONS, HX OF (ICD-V12.50) - notes occas fluttering and "jello feeling" in her chest... Metoprolol  helps.  HYPERCHOLESTEROLEMIA (ICD-272.0) - on ZOCOR 40mg Qhs + Fish Oil  + OTC Niacin> followed in the LC. ~  FLP 01/03/08 on SIMVASTATIN 20mg /d showed TChol 206, TG 182, HDL 31, LDL 145... ~  FLP 6/09 (on Simva40) showed TChol 173, TG 199, HDL 31, LDL 102... continue f/u in lipid clinic. ~  FLP 11/09 (on Simva40) showed TChol 196, TG 265, HDL 32, LDL 119... she stopped lipid clinic. ~  Due to continued "side effects" she placed the Simvastatin on hold, then restarted 40mg /d... ~  FLP 6/10 showed TChol 218, TG 394, HDL 33, LDL  129...  ~  FLP 11/10 showed TChol 262, TG 568, HDL 31, LDL 144... she needs to ret to the Lipid Clinic. ~  FLP 1/11 after 20# wt loss showed TChol 144, TG 181, HDL 43, LDL 65 on Simva40 ~  FLP 10/11 on Simva40+FishOil showed TChol 145, TG 237, HDL 33, LDL 79 ~  FLP 2/12 on Simva40+FishOil showed TChol 126, TG 238, HDL 27, LDL 66 ~  FLP 6/12 in EPIC per LC showed TChol 160, TG 226, HDL 31, LDL 92... Continue meds, low fat diet, exercise. ~  FLP 12/12 by LC on Simva40+Niasp500 showed TChol 113, TG 87, HDL 36, LDL 60 ~  She continues regular f/u in Lipid Clinic...  GERD (ICD-530.81) - on PRILOSEC 20mg /d, and Phenergan as needed... ~  prev EGD 8/05 showed 3cmHH, gastric polyp... prev EGD w/ stricture dilated. ~  EGD repeated 12/09 & showed 5cm HH w/ healing ulcer, esophagitis, gastritis, etc... HPylori neg... REC> continue Prilosec, Phenergan. ~  she saw [name], [name] 8/10 for eval of nausea- no help from Reglan, Scopalamine, etc... had UGI/ Ba swallow- sm HH, mild reflux, norm peristalsis;  Gastric Emptying Scan- normal;  EGD 9/10 by [facility] w/ esoph ulcer seen, neg CLO, benign gastric polyp removed, Rx'd PPI. ~  5/12:  She states that she no longer has any GI issues on her vegan diet... ~  11/12:  She notes that Gabriel Rung gives her juiced carrots and spinach daily...  DIVERTICULOSIS OF COLON (ICD-562.10) - Rx w/ PROCTOSOL HC Cream Prn, and stool softeners... COLONIC POLYPS  (ICD-211.3) - colonoscopy 8/07 by [name] showed divertics only...  CYSTITIS (ICD-595.9) - she was seen at [facility] for poss interstitial cystitis... prev DMSO Rx. ~  Eval by [name] & pt tells me she is on Elmiron both orally & via bladder (self-cath). ~  9/11:  she reports IC is quiet & off Elmiron Rx now. ~  11/12:  She confirms min symptoms at present...  DEGENERATIVE JOINT DISEASE (ICD-715.90) - prev Rx MOBIC 7.5mg  Prn, now on OXYCODONE- taking daily for her pain per [name]... she had a right TKR 12/07 by [name]... then required right Patella surg 10/08 from [name]... now s/p right TKR revision [facility]na at [facility]- they referred to pain management... ~  9/11: she tells me that [name] is referring her to [facility] for another opinion regarding her knees ([name]- there was nothing they could do & rec Pain Management, [facility], & veggie diet). ~  11/12:  She reports left knee bone-on-bone  w/ shot from [facility] 10/12 which helped; still followed by Pain Management & she tells me she's taking Percocet 10mg  every 4H (ave 3-4 per day).  BACK PAIN, LUMBAR (ICD-724.2) - she had a right L4-5 decompressive laminotomy w/ foraminotomy 12/02 by DrPool... then L4-5 decompression & fusion 9/09 by DrPool due to DDD w/ synovial cyst & degen spondylolithesis w/ stenosis... f/u XRays and MRI 5/10 showed fusion intact, NAD.[name]. ~  CHRONIC PAIN MANAGEMENT> followed by [name] clinic now, on OXYCODONE 10-20mg  every 4H prn (ave 3-4/d she says), plus MOBIC 7.5mg /d.  FIBROMYALGIA (ICD-729.1) - she continues to have mult somatic complaints... she has seen DrDeveshwar in the past...  ~  She reports trial of Accupuncture> it helped the FM but not the knee pain. ~  husb has inquired about periph nerve surg he researched on the internet, he is referred to her specialists to discuss options...  OSTEOPENIA (ICD-733.90) - on calcium, MVI, Vit D... VITAMIN D  DEFICIENCY (ICD-268.9) -  ~  Vit D  level was 5 (30-90) 11/08 & rec to start VitD 50K/wk... ~  Vit D level 11/09 = 45  ~  Vit D level 6/10 = 23 ~  Vit D level 11/10 = 35 ~  Vit D level 5/11 = 70... switched to Vit D 2000u daily. ~  BMD 12/12 by DrFontaine showed lowest measured site= right FemNeck -1.2  ANXIETY (ICD-300.00) - on ALPRAZOLAM 0.5mg  as needed- taking 3 per day recently (she notes mild tremor & this helps). DEPRESSION (ICD-311)  Hx of PSORIASIS (ICD-696.1)  Hx of UNSPECIFIED DISORDER PLASMA PROTEIN METABOLISM (ICD-273.9) - she has a biclonal gammopathy evaluated 10/05 by [name] who thought it was reactive, perhaps a MGUS variant... ~  f/u labs 11/09 showed Hg= 12.2, TProt= 7.1, SPE/ IEP w/ biclonal IgM kappa protein present accounting for 0.31g/dl of the total 1.61 g/dl of protein in the gamma region & IgM level = 319 mg/dl... ~  labs 11/10 showed Hg= 12.5, TProt= 7.8, SPEP/IEP w/ monoclonal IgM kappa & IgM lamba proteins accounting for 0.44 g/dl of the total 0.96 g/dl in the gamma region... IgM level = 490mg /dl... ~  11/10:  Referred back to [name] who has resumed follow up of this problem... ~  4/11:  [name] did CT Chest/ Abd/ Pelvis> neg w/o adenopathy etc... ~  2012> she was given IV iron by [name] & reports feeling some better... ~  11/12:  Labs here showed Hg= 13.7, Fe= 61 (18%sat) ~  Labs from [facility] (once yearly in the fall) reviewed in EPIC flow sheet...   Past Surgical History  Procedure Date  . Laminotomy 2010    L-4, L-5 FUSION  . Knee arthroscopy     RIGHT KNEE X 5  . Total knee arthroplasty 12-07 and 4-07    Dr. Shelle Iron, revision 12-09 Dr. Eileen Stanford at [facility]  . Patella fracture surgery 10-08    Dr. Charlann Boxer  . Posterior laminectomy / decompression lumbar spine 9-09    Dr. Jordan Likes  . Cholecystectomy   . Hand surgery     TRIGGER FINGER  . Hernia repair   . Laparoscopic oophorectomy 2010    BILATERAL  . Abdominal hysterectomy     for prolapse with abdominal incision  . Oophorectomy      BSO    Outpatient Encounter Prescriptions as of 07/03/2012  Medication Sig Dispense Refill  . ALPRAZolam (XANAX) 0.5 MG tablet Take 0.5 mg by mouth as needed.      [name] amoxicillin (AMOXIL) 500 MG capsule Take as directed prior to procedures      . Cholecalciferol (VITAMIN D) 2000 UNITS CAPS Take 2 capsules by mouth daily.       . fish oil-omega-3 fatty acids 1000 MG capsule Take 2 g by mouth 2 (two) times daily.        . fluconazole (DIFLUCAN) 150 MG tablet as needed.       . hydrocortisone (ANUSOL-HC) 2.5 % rectal cream Place 1 application rectally 2 (two) times daily as needed.  30 g  5  . hydrOXYzine (ATARAX/VISTARIL) 25 MG tablet Take 25 mg by mouth as needed.      . meloxicam (MOBIC) 7.5 MG tablet Take 2 tablets by mouth daily  180 tablet  3  . metoprolol tartrate (LOPRESSOR) 25 MG tablet Take 1/2 tablet by mouth every morning  90 tablet  3  . Multiple Vitamins-Iron (MULTIVITAMIN/IRON PO) Take by mouth.        . niacin (  NIASPAN) 500 MG CR tablet Take 1 tablet (500 mg total) by mouth at bedtime.  90 tablet  3  . omeprazole (PRILOSEC) 20 MG capsule Take 20 mg by mouth every other day.        . Oxycodone HCl 20 MG TABS 4 (four) times daily as needed. Take as directed per Dr. Vear Clock      . promethazine (PHENERGAN) 25 MG tablet Take 25 mg by mouth every 6 (six) hours as needed.        . simvastatin (ZOCOR) 40 MG tablet Take 1/2 tablet by mouth at bedtime  90 tablet  3     Allergies  Allergen Reactions  . Mepergan (Meperidine-Promethazine)     Cardiac arrest  . Iohexol      Code: HIVES, Desc: pt states her face and throat swells when administered IV contrast - KB, Onset Date: [id number]   . Valsartan     REACTION: pt states INTOL to Diovan    Current Medications, Allergies, Past Medical History, Past Surgical History, Family History, and Social History were reviewed in [employer] record.    Review of Systems         Constitutional:   No  weight loss, night  sweats,  Fevers, chills, + fatigue, or  lassitude.  HEENT:   No headaches,  Difficulty swallowing,  Tooth/dental problems, or  Sore throat,                No sneezing, itching, ear ache, nasal congestion, post nasal drip,   CV:  No chest pain,  Orthopnea, PND, swelling in lower extremities, anasarca, dizziness, palpitations, syncope.   GI  No heartburn, indigestion, abdominal pain, , vomiting, diarrhea, change in bowel habits, loss of appetite, bloody stools.   Resp: No shortness of breath with exertion or at rest.  No excess mucus, no productive cough,  No non-productive cough,  No coughing up of blood.  No change in color of mucus.  No wheezing.  No chest wall deformity  Skin: no rash or lesions.  GU: no dysuria, change in color of urine, no urgency or frequency.  No flank pain, no hematuria   MS:  No joint   swelling.     Psych:   .  No memory loss.       Objective:   Physical Exam     WD, WN, 66 y/o WF in NAD... GENERAL:  Alert & oriented; pleasant & cooperative... HEENT:  [place]/AT, EOM-wnl, PERRLA, EACs-clear, TMs-wnl, NOSE-clear, THROAT-clear & wnl. NECK:  Supple w/ fairROM; no JVD; normal carotid impulses w/o bruits; no thyromegaly or nodules palpated; no lymphadenopathy. CHEST:  Clear to P & A; without wheezes/ rales/ or rhonchi. HEART:  Regular Rhythm; without murmurs/ rubs/ or gallops. ABDOMEN:  Soft & nontender; normal bowel sounds; no organomegaly or masses detected. EXT: s/p right TKR, mod arthritic changes; no varicose veins/ +venous insuffic/ tr edema. BACK:  scar of surgery, +trigger points.diffuse  NEURO:  CN's intact;  no focal neuro deficits..., nml grips, nml gait, nml proximal /distal strength, no tremors noted. No muscle fasiculations noted  DERM: clear   Assessment & Plan:

## 2012-07-04 ENCOUNTER — Telehealth: Payer: Self-pay | Admitting: Adult Health

## 2012-07-04 DIAGNOSIS — M6240 Contracture of muscle, unspecified site: Secondary | ICD-10-CM | POA: Insufficient documentation

## 2012-07-04 LAB — CBC WITH DIFFERENTIAL/PLATELET
Eosinophils Relative: 3.7 % (ref 0.0–5.0)
HCT: 44 % (ref 36.0–46.0)
Hemoglobin: 14.4 g/dL (ref 12.0–15.0)
Lymphocytes Relative: 18.9 % (ref 12.0–46.0)
Lymphs Abs: 1.6 10*3/uL (ref 0.7–4.0)
Monocytes Relative: 4.7 % (ref 3.0–12.0)
Neutro Abs: 6.3 10*3/uL (ref 1.4–7.7)
Platelets: 224 10*3/uL (ref 150.0–400.0)
WBC: 8.7 10*3/uL (ref 4.5–10.5)

## 2012-07-04 LAB — BASIC METABOLIC PANEL
BUN: 24 mg/dL — ABNORMAL HIGH (ref 6–23)
Calcium: 9.2 mg/dL (ref 8.4–10.5)
Chloride: 103 mEq/L (ref 96–112)
Creatinine, Ser: 1.2 mg/dL (ref 0.4–1.2)
GFR: 46.93 mL/min — ABNORMAL LOW (ref >60.00)

## 2012-07-04 LAB — CK TOTAL AND CKMB (NOT AT ARMC)
CK, MB: 0.7 ng/mL (ref 0.3–4.0)
Total CK: 18 U/L (ref 7–177)

## 2012-07-04 LAB — TSH: TSH: 5.92 u[IU]/mL — ABNORMAL HIGH (ref 0.35–5.50)

## 2012-07-04 LAB — VITAMIN B12: Vitamin B-12: 346 pg/mL (ref 211–911)

## 2012-07-04 LAB — HEPATIC FUNCTION PANEL
ALT: 14 U/L (ref 0–35)
Total Bilirubin: 0.7 mg/dL (ref 0.3–1.2)

## 2012-07-04 MED ORDER — PROMETHAZINE HCL 25 MG PO TABS: 25.0000 mg | ORAL_TABLET | Freq: Four times a day (QID) | ORAL | Status: DC | PRN

## 2012-07-04 NOTE — Assessment & Plan Note (Signed)
Involuntary muscle contraction ? Etiology  Will hold statin therapy for now.  Check CK total/LFT  Have her return in 4 weeks , if not better consider referral to neuro  Please contact office for sooner follow up if symptoms do not improve or worsen or seek emergency care

## 2012-07-04 NOTE — Telephone Encounter (Signed)
Called and spoke with pt and she is aware of the phenergan that has been called to the pharmacy and we will call her once the labs are all back.  Pt voiced her understanding of this.

## 2012-07-05 ENCOUNTER — Encounter: Payer: Self-pay | Admitting: Adult Health

## 2012-07-05 DIAGNOSIS — E039 Hypothyroidism, unspecified: Secondary | ICD-10-CM | POA: Insufficient documentation

## 2012-07-05 HISTORY — DX: Hypothyroidism, unspecified: E03.9

## 2012-07-05 NOTE — Progress Notes (Signed)
Quick Note:  LMOM TCB x1. ______ 

## 2012-07-06 ENCOUNTER — Telehealth: Payer: Self-pay | Admitting: Pulmonary Disease

## 2012-07-06 MED ORDER — LEVOTHYROXINE SODIUM 50 MCG PO TABS: 50.0000 ug | ORAL_TABLET | Freq: Every day | ORAL | Status: DC

## 2012-07-06 NOTE — Telephone Encounter (Signed)
Notes Recorded by Julio Sicks, NP on 07/05/2012 at 11:57 AM Labs are ok except for thyroid  No sign of muscle irritation  LFTs ok  Cont w/ ov recs  Thyroid sluggish would recommend beginning Synthroid 1 po daily #30 , recheck tsh in 4- 6 weeks at ov  Please contact office for sooner follow up if symptoms do not improve or worsen or seek emergency care    I spoke with patient about results and she verbalized understanding and had no questions. Pt would like RX mailed to her. i have printed this off and will have TP sign this. Please advise Shanda Bumps thanks

## 2012-07-06 NOTE — Telephone Encounter (Signed)
Rx synthroid qd signed by TP and placed in the mail per pt's request.

## 2012-07-06 NOTE — Telephone Encounter (Signed)
Pt wanted to hear  Results again and make sure that rx was mailed. Advised her that rx was mailed an made her aware of results . Nothing further  Was needed.

## 2012-07-10 ENCOUNTER — Telehealth: Payer: Self-pay | Admitting: Pulmonary Disease

## 2012-07-10 MED ORDER — LEVOTHYROXINE SODIUM 50 MCG PO TABS: 50.0000 ug | ORAL_TABLET | Freq: Every day | ORAL | Status: DC

## 2012-07-10 NOTE — Telephone Encounter (Signed)
Called and spoke with pt and she is aware of refills that have been sent to the pharmacy per her request.  Nothing further is needed.

## 2012-07-13 ENCOUNTER — Telehealth: Payer: Self-pay | Admitting: Pulmonary Disease

## 2012-07-13 MED ORDER — ONDANSETRON HCL 4 MG PO TABS: 4.0000 mg | ORAL_TABLET | Freq: Three times a day (TID) | ORAL | Status: DC | PRN

## 2012-07-13 NOTE — Telephone Encounter (Signed)
Member # [id number]. Promethazine 25 mg tabs is non formulary. The covered altyernative is generic Zofran.  Pls advise on changing this for the pt.

## 2012-07-13 NOTE — Telephone Encounter (Signed)
zofran 4 mg generic has been sent in to the pharmacy per TP recs.  This has been completed. Nothing further is needed.

## 2012-07-13 NOTE — Telephone Encounter (Signed)
That is fine  zofran 4mg  every 8 hr As needed  Nausea #30 2 refills

## 2012-07-20 ENCOUNTER — Telehealth: Payer: Self-pay | Admitting: Pulmonary Disease

## 2012-07-20 NOTE — Telephone Encounter (Signed)
Per SN---sluggish feeling is likely from her FM and not her thyroid---we will need to wait at least 8 weeks to recheck her thyroid level on the dose that she is taking and will tweak the dosing at that time.  thanks

## 2012-07-20 NOTE — Telephone Encounter (Signed)
Pt notified of SN recs and verbalized understanding. She will call if she continues to have problems.[name]

## 2012-07-20 NOTE — Telephone Encounter (Signed)
Spoke with patient, states she is still feeling slugglish since starting levothyroxine on 07/10/12.  Explained to patient that is takes a while for medication to get into system and you start feeling the benefit from it, however patient says she just does not know how much longer she can go feeling so sluggish and tired.  Dr. Kriste Basque please advise, thank you!

## 2012-08-01 ENCOUNTER — Telehealth: Payer: Self-pay | Admitting: Pulmonary Disease

## 2012-08-01 MED ORDER — CEPHALEXIN 500 MG PO CAPS: 500.0000 mg | ORAL_CAPSULE | Freq: Four times a day (QID) | ORAL | Status: DC

## 2012-08-01 NOTE — Telephone Encounter (Signed)
Patient states that she has a sore throat x 2 weeks. Very dry and sore when she swallows. The only other symptoms she is having is runny nose. Patient states that she has been using the Miracle Mouth wash that she has and getting no relief on her throat. She is requesting that something be called in for her to get some relief or some recs per SN. Patient has future appt on 08/03/12 with SN.   Patients pharm-- [facility]  [place], [place]  Dr Kriste Basque pls advise. Thanks.   Allergies  Allergen Reactions  . Mepergan (Meperidine-Promethazine)     Cardiac arrest  . Iohexol      Code: HIVES, Desc: pt states her face and throat swells when administered IV contrast - KB, Onset Date: [id number]   . Valsartan     REACTION: pt states INTOL to Diovan

## 2012-08-01 NOTE — Telephone Encounter (Signed)
Patient aware of recs Per SN--Advised patient to increase fluids, continue the MMW QID, keflex 500 mg #28 1 po QID and take align 1 daily.  Rx sent to [facility], [place]   keflex 500 mg #28 1 po QID

## 2012-08-01 NOTE — Telephone Encounter (Signed)
Per SN---increase fluids, continue the MMW QID, keflex 500 mg  #28  1 po QID and take align 1 daily .    thanks

## 2012-08-02 ENCOUNTER — Other Ambulatory Visit: Payer: Self-pay | Admitting: Pulmonary Disease

## 2012-08-03 ENCOUNTER — Telehealth: Payer: Self-pay | Admitting: Pulmonary Disease

## 2012-08-03 ENCOUNTER — Encounter: Payer: Self-pay | Admitting: Pulmonary Disease

## 2012-08-03 ENCOUNTER — Ambulatory Visit (INDEPENDENT_AMBULATORY_CARE_PROVIDER_SITE_OTHER): Payer: Medicare Other | Admitting: Pulmonary Disease

## 2012-08-03 VITALS — BP 140/82 | HR 57 | Temp 96.8°F | Ht 66.5 in | Wt 169.2 lb

## 2012-08-03 DIAGNOSIS — IMO0001 Reserved for inherently not codable concepts without codable children: Secondary | ICD-10-CM

## 2012-08-03 DIAGNOSIS — G894 Chronic pain syndrome: Secondary | ICD-10-CM

## 2012-08-03 DIAGNOSIS — D472 Monoclonal gammopathy: Secondary | ICD-10-CM

## 2012-08-03 DIAGNOSIS — E8809 Other disorders of plasma-protein metabolism, not elsewhere classified: Secondary | ICD-10-CM

## 2012-08-03 DIAGNOSIS — M1990 Unspecified osteoarthritis, unspecified site: Secondary | ICD-10-CM

## 2012-08-03 DIAGNOSIS — M899 Disorder of bone, unspecified: Secondary | ICD-10-CM

## 2012-08-03 DIAGNOSIS — K5730 Diverticulosis of large intestine without perforation or abscess without bleeding: Secondary | ICD-10-CM

## 2012-08-03 DIAGNOSIS — D126 Benign neoplasm of colon, unspecified: Secondary | ICD-10-CM

## 2012-08-03 DIAGNOSIS — F411 Generalized anxiety disorder: Secondary | ICD-10-CM

## 2012-08-03 DIAGNOSIS — E7800 Pure hypercholesterolemia, unspecified: Secondary | ICD-10-CM | POA: Diagnosis not present

## 2012-08-03 DIAGNOSIS — E039 Hypothyroidism, unspecified: Secondary | ICD-10-CM

## 2012-08-03 DIAGNOSIS — I10 Essential (primary) hypertension: Secondary | ICD-10-CM | POA: Diagnosis not present

## 2012-08-03 DIAGNOSIS — K219 Gastro-esophageal reflux disease without esophagitis: Secondary | ICD-10-CM

## 2012-08-03 DIAGNOSIS — D509 Iron deficiency anemia, unspecified: Secondary | ICD-10-CM

## 2012-08-03 DIAGNOSIS — N3090 Cystitis, unspecified without hematuria: Secondary | ICD-10-CM

## 2012-08-03 DIAGNOSIS — M545 Low back pain: Secondary | ICD-10-CM

## 2012-08-03 MED ORDER — ALPRAZOLAM 0.5 MG PO TABS: ORAL_TABLET | ORAL | Status: DC

## 2012-08-03 MED ORDER — MELOXICAM 7.5 MG PO TABS: ORAL_TABLET | ORAL | Status: DC

## 2012-08-03 MED ORDER — PROMETHAZINE HCL 25 MG PO TABS: 25.0000 mg | ORAL_TABLET | Freq: Four times a day (QID) | ORAL | Status: DC | PRN

## 2012-08-03 MED ORDER — HYDROCORTISONE 2.5 % RE CREA: 1.0000 "application " | TOPICAL_CREAM | Freq: Two times a day (BID) | RECTAL | Status: DC | PRN

## 2012-08-03 MED ORDER — METOPROLOL TARTRATE 25 MG PO TABS: ORAL_TABLET | ORAL | Status: DC

## 2012-08-03 MED ORDER — SIMVASTATIN 40 MG PO TABS: ORAL_TABLET | ORAL | Status: DC

## 2012-08-03 MED ORDER — NIACIN ER (ANTIHYPERLIPIDEMIC) 500 MG PO TBCR: 500.0000 mg | EXTENDED_RELEASE_TABLET | Freq: Every day | ORAL | Status: DC

## 2012-08-03 MED ORDER — LEVOTHYROXINE SODIUM 50 MCG PO TABS: 50.0000 ug | ORAL_TABLET | Freq: Every day | ORAL | Status: DC

## 2012-08-03 NOTE — Progress Notes (Signed)
Subjective:    Patient ID: [name], female    DOB: 1946-07-26, 66 y.o.   MRN: [id number]  HPI 66 y/o WF here for a follow up visit... she has mult med problems and is well known to [place]... also followed in the Lipid Clinic, and by numerous specialists as noted below...   ~  Jan 26, 2010:  Sleep eval [name] w/ 11/10 study showing AHI= 25 w/ obstructive & central events... CPAP titration was suboptimal & she was tried on BiPAP... she tells me that she stopped all this when she lost weight & husb confirms no snoring or sleep disordered breathing- rests well etc...  she saw [name] for f/u MGUS & rising IgM level> he felt she was stable compared to his old values & rec CT Scans to r/o lymphoma (done 4/11 & neg- no lesions seen, mild hepatomegaly noted)...  she also followed by [name] for Urology w/ IC> on Elmiron orally & in bladder (she does self caths now)...  also followed by [name] in the Pain Center now on referral from Ortho> tried on NUCYNTA ("too expensive")...  also notes improvement in FLP/ lipid clinic f/u on Simva40 & weight loss via diet etc...  ~  May 28, 2010:  states tick bite 8/18 "[name] says it was a deer tic" on upper inner left bicep area... given Doxy x7d per [name] & no systemic symptoms (no HA, fever, change in FM symptoms) but notes rash, itching, sweats, & pain in arm... on exam> no signif rash, sm red spot where she says tick was removed (no resid parts left), no erythema migrans etc... we discussed checking CBC & Lyme titers; Rx w/ Atarax for itching.  ~  July 29, 2010:  states she's doing "so-so"> pain management & [facility] (DrJinnah- Ortho) sent her to [facility]- [name] for another opinion about her knee- notes pending & she requiring Oxycodone 15mg  Tid for relief...  BP is well controlled on meds;  she denies CP & notes rare self-lim palpit, SOB at baseline & too sedentary due to ortho probs;  Lipids stable on Simva40;  GI stable on numerous meds;  &  continues on Alpraz 3/d for nerves...  ~  Jan 27, 2011:  45mo ROV & she is basically stable> still fatigued and now c/o itching, no rash, & we discussed Atarax for Prn use;  She feels she has improved on vegan diet along w/ her husb [facility];  She is still seeing Pain Management & says "they say I have inflammation in my body";  She recently saw [name] for her 45mo f/u Heme/Onc testing...  ~  August 02, 2011:  45mo ROV & CPX per pt request> she is doing well on husb's vegan diet & wt down 12# further to 166# today w/ BMI=26; still c/o tired, fatigued- but better after IV Fe by [name];  BP improved on Metoprolol & she wants to decr to 1/d;  FLP followed in the Lipid Clinic on Simva, Niacin, Fish Oil;  States bowels are improved on carrot/spinach juiced by husb daily;  Still followed by pain management & taking Percocet 20mg  every 4H (ave 3-4 per day) she says;  See prob list below>>  ~  Feb 01, 2012:  45mo ROV & [place] continues to feel terrible overall- states she is in constant pain from her knees & legs; on Oxcodone per [name] pain clinic (states she is taking 3-6 tabs daily); Husb is inquiring about 2 clinics  he found on the internet one in [place] & one in [place] that do peripheral nerve surgery (I told him he was grasping & needed to discuss w/ her specialists if serious); she brought a BP log & pressures are ok but pulse is running low on her BBlocker so we agreed w/ decr Metoprolol 25mg - 1/2 Bid;  She is followed in the Lipid Clinic prev on Simva plus Niasp & Fish Oil, but husb wants her off Statins, they compromised & decr the Simva to 20mg /d & f/u labs pending...  We reviewed prob list, meds, xrays and labs>  See below>>    She saw DrFontaine, Gyn> routine check, atrophic changes, osteopenia- f/u BMD done 12/12 w/ lowest TScore -1.2 in right FemNeck...  ~  August 03, 2012:  32mo ROV & [place] continues to have a very difficult time w/ her FM, chronic pain syndrome,  etc; she is going to contact DrDeveshwar for a follow up rheum eval....    HxOSA, AR, Sinusitis> c/o recent sore throat/ URI treated w/ Keflex, MMW, Align & finally better...    HBP, CP, Palpit> on Metop25-1/2Qam; BP=140/82 today & 120s/60s at home; she denies recent CP, palpit, ch in SOB, edema, etc...    Chol> on Simva40-1/2daily & Niacin500; followed in the LC- last FLP 12/12 showed TChol 113, TG 87, HDL 36, LDL 60    Hypothyroid> on ZOXWRUEAV40; TP started med 10/13 when TSH=5.93; pt notes she's not feeling any better; we discussed recheck TSH on return...    GI> GERD, Divertics, Polyps> on Prilosec20, Phen25 prn, AnusolHC; she denies abd pain, notes some intermit nausea, no d/c/ blood seen...    GU> IC> prev treatment w/ DMSO; states voiding OK, denies pain etc...    DJD, LBP, FM, Chr Pain Syndrome> followed by [name] on Oxycodone 20Qid prn, Mobic 7.5/d; and DrIbazedo- seen 9/13 w/ shot in back (helped)...    Osteopenia> on Ca, MVI, VitD; last BMD 12/12 showed TScore +0.7 in Spine, and -1.2 in right FemNeck...    Anxiety> on Alprazolam 0.5mg Tid prn...    MGUS> followed by [name] for IgM monoclonal gammopathy- seen 10/13 & labs w/o signif change x Fe sl low (pt rec to incr Fe in diet she says)... We reviewed prob list, meds, xrays and labs> see below for updates >>  LABS 10/13:  Chems- ok w/ BS=115;  CBC- wnl;  Fe=36 (sat11%);  Ferritin=37; +Monoclonal IgM kappa paraprotein (no change in levels)...           Problem List:  UNSPECIFIED SLEEP APNEA (ICD-780.57) - Complex sleep apnea - PSG 07/25/09 AHI 28 > ~  Sleep eval [name] w/ 11/10 study showing AHI= 25 w/ obstructive & central events... CPAP titration was suboptimal & she was tried on BiPAP... she tells me that she stopped all this when she lost weight & husb confirms no snoring or sleep disordered breathing- rests well etc...   ALLERGIC RHINITIS (ICD-477.9) Hx of SINUSITIS (ICD-473.9) - Hx sinusitis followed by [name] at  [facility]...  HYPERTENSION (ICD-401.9) - on METOPROLOL 25mg  Qam now ... she was hosp 3/09 w/ gastroenteritis & BP meds were stopped due to weakness; grad restarted w/ improvement in BP & palpit... ~  11/11: BP= 122/70 she is INTOL to Diovan; denies HA, visual symptoms, CP, palpit, dizzy, syncope, edema; husb notes that BPs are similar at home ~120/70's... ~  5/12:  BP= 112/70 & she has the same chr fatigue complaints, but no specific symptoms... ~  11/12:  BP=130/80 & she  has the same chronic complaints but denies CP, syncope, ch in SOB, edema; [facility]; she wants to decr the Metoprolol to 25mg  daily- ok... ~  CXR 11/12 showed clear lungs, normal heart size, mild DJD TSpine... ~  5/13:  BP= 142/80 & she persists w/ mult chronic complaints... ~  11/13:  BP= 140/82 & she is clinically stable...  CHEST PAIN (ICD-786.50) - seen by [name] in 2006... ~  2DEcho 11/98 showed norm valves, norm LVF, ? min LVH... ~  cath 7/03 w/ norm coronaries, & norm LVF... ~  NuclearStressTest 2/06 showed no infarct, no ischemia, EF=65%... ~  Myoview repeated 4/10: low risk study w/ soft tissue attenuation, normal wall motion, EF= 78%... ~  EKG 11/12 showed NSR, WNL.Marland KitchenMarland Kitchen  PALPITATIONS, HX OF (ICD-V12.50) - notes occas fluttering and "jello feeling" in her chest... Metoprolol helps.  HYPERCHOLESTEROLEMIA (ICD-272.0) - on ZOCOR 40mg Qhs + Fish Oil  + OTC Niacin> followed in the LC. ~  FLP 01/03/08 on SIMVASTATIN 20mg /d showed TChol 206, TG 182, HDL 31, LDL 145... ~  FLP 6/09 (on Simva40) showed TChol 173, TG 199, HDL 31, LDL 102... continue f/u in lipid clinic. ~  FLP 11/09 (on Simva40) showed TChol 196, TG 265, HDL 32, LDL 119... she stopped lipid clinic. ~  Due to continued "side effects" she placed the Simvastatin on hold, then restarted 40mg /d... ~  FLP 6/10 showed TChol 218, TG 394, HDL 33, LDL 129...  ~  FLP 11/10 showed TChol 262, TG 568, HDL 31, LDL 144... she needs to ret to the Lipid Clinic. ~   FLP 1/11 after 20# wt loss showed TChol 144, TG 181, HDL 43, LDL 65 on Simva40 ~  FLP 10/11 on Simva40+FishOil showed TChol 145, TG 237, HDL 33, LDL 79 ~  FLP 2/12 on Simva40+FishOil showed TChol 126, TG 238, HDL 27, LDL 66 ~  FLP 6/12 in EPIC per LC showed TChol 160, TG 226, HDL 31, LDL 92... Continue meds, low fat diet, exercise. ~  FLP 12/12 by LC on Simva40+Niasp500 showed TChol 113, TG 87, HDL 36, LDL 60 ~  She continues regular f/u in Lipid Clinic...  GERD (ICD-530.81) - on PRILOSEC 20mg /d, and Phenergan as needed... ~  prev EGD 8/05 showed 3cmHH, gastric polyp... prev EGD w/ stricture dilated. ~  EGD repeated 12/09 & showed 5cm HH w/ healing ulcer, esophagitis, gastritis, etc... HPylori neg... REC> continue Prilosec, Phenergan. ~  she saw [name], [name] 8/10 for eval of nausea- no help from Reglan, Scopalamine, etc... had UGI/ Ba swallow- sm HH, mild reflux, norm peristalsis;  Gastric Emptying Scan- normal;  EGD 9/10 by [facility] w/ esoph ulcer seen, neg CLO, benign gastric polyp removed, Rx'd PPI. ~  5/12:  She states that she no longer has any GI issues on her vegan diet... ~  11/12:  She notes that Gabriel Rung gives her juiced carrots and spinach daily...  DIVERTICULOSIS OF COLON (ICD-562.10) - Rx w/ PROCTOSOL HC Cream Prn, and stool softeners... COLONIC POLYPS (ICD-211.3) - colonoscopy 8/07 by [name] showed divertics only...  CYSTITIS (ICD-595.9) - she was seen at [facility] for poss interstitial cystitis... prev DMSO Rx. ~  Eval by [name] & pt tells me she is on Elmiron both orally & via bladder (self-cath). ~  9/11:  she reports IC is quiet & off Elmiron Rx now. ~  11/12:  She confirms min symptoms at present...  DEGENERATIVE JOINT DISEASE (ICD-715.90) - prev Rx MOBIC 7.5mg  Prn, now on OXYCODONE-  taking daily for her pain per [name]... she had a right TKR 12/07 by [name]... then required right Patella surg 10/08 from [name]... now s/p right TKR revision by DrJinna  at [facility]- they referred to pain management... ~  9/11: she tells me that [name] is referring her to [facility] for another opinion regarding her knees ([name]- there was nothing they could do & rec Pain Management, [facility], & veggie diet). ~  11/12:  She reports left knee bone-on-bone w/ shot from [facility] 10/12 which helped; still followed by Pain Management & she tells me she's taking Percocet 10mg  every 4H (ave 3-4 per day). ~  11/13:  She continues to follow up w/ [name] pain clinic 7 reports taking Oxycod 20mg  3-4 tabs daily, plus Mobic7.5.Marland KitchenMarland Kitchen  BACK PAIN, LUMBAR (ICD-724.2) - she had a right L4-5 decompressive laminotomy w/ foraminotomy 12/02 by DrPool... then L4-5 decompression & fusion 9/09 by DrPool due to DDD w/ synovial cyst & degen spondylolithesis w/ stenosis... f/u XRays and MRI 5/10 showed fusion intact, NAD.[name]. ~  CHRONIC PAIN MANAGEMENT> followed by [name] clinic now, on OXYCODONE 10-20mg  every 4H prn (ave 3-4/d she says), plus MOBIC 7.5mg /d. ~  9/13:  She reports that she had ESI from DrIbazedo, w/ some improvement...  FIBROMYALGIA (ICD-729.1) - she continues to have mult somatic complaints... she has seen DrDeveshwar in the past...  ~  She reports trial of Accupuncture> it helped the FM but not the knee pain. ~  husb has inquired about periph nerve surg he researched on the internet, he is referred to her specialists to discuss options... ~  11/13:  They indicate that they have a f/u appt w/ DrDeveshwar for Rheum...  OSTEOPENIA (ICD-733.90) - on calcium, MVI, Vit D... VITAMIN D DEFICIENCY (ICD-268.9) -  ~  Vit D level was 5 (30-90) 11/08 & rec to start VitD 50K/wk... ~  Vit D level 11/09 = 45  ~  Vit D level 6/10 = 23 ~  Vit D level 11/10 = 35 ~  Vit D level 5/11 = 70... switched to Vit D 2000u daily. ~  BMD 12/12 by DrFontaine showed lowest measured site= right FemNeck -1.2  ANXIETY (ICD-300.00) - on ALPRAZOLAM 0.5mg  as needed- taking 3 per day recently (she  notes mild tremor & this helps). DEPRESSION (ICD-311)  Hx of PSORIASIS (ICD-696.1)   Hx of UNSPECIFIED DISORDER PLASMA PROTEIN METABOLISM (ICD-273.9) - she has a biclonal gammopathy evaluated 10/05 by [name] who thought it was reactive, perhaps a MGUS variant... ~  f/u labs 11/09 showed Hg= 12.2, TProt= 7.1, SPE/ IEP w/ biclonal IgM kappa protein present accounting for 0.31g/dl of the total 1.61 g/dl of protein in the gamma region & IgM level = 319 mg/dl... ~  labs 11/10 showed Hg= 12.5, TProt= 7.8, SPEP/IEP w/ monoclonal IgM kappa & IgM lamba proteins accounting for 0.44 g/dl of the total 0.96 g/dl in the gamma region... IgM level = 490mg /dl... ~  11/10:  Referred back to [name] who has resumed follow up of this problem... ~  4/11:  [name] did CT Chest/ Abd/ Pelvis> neg w/o adenopathy etc... ~  2012> she was given IV iron by [name] & reports feeling some better... ~  11/12:  Labs here showed Hg= 13.7, Fe= 61 (18%sat) ~  Labs from [facility] [employer] (once yearly in the fall) reviewed in EPIC flow sheet ==> IgM monoclonal gammopathy w/ stable parameters.   Past Surgical History  Procedure Date  . Laminotomy 2010  L-4, L-5 FUSION  . Knee arthroscopy     RIGHT KNEE X 5  . Total knee arthroplasty 12-07 and 4-07    Dr. Shelle Iron, revision 12-09 Dr. Eileen Stanford at [facility]  . Patella fracture surgery 10-08    Dr. Charlann Boxer  . Posterior laminectomy / decompression lumbar spine 9-09    Dr. Jordan Likes  . Cholecystectomy   . Hand surgery     TRIGGER FINGER  . Hernia repair   . Laparoscopic oophorectomy 2010    BILATERAL  . Abdominal hysterectomy     for prolapse with abdominal incision  . Oophorectomy     BSO    Outpatient Encounter Prescriptions as of 08/03/2012  Medication Sig Dispense Refill  . ALPRAZolam (XANAX) 0.5 MG tablet Take 0.5 mg by mouth as needed.      [name] amoxicillin (AMOXIL) 500 MG capsule Take as directed prior to procedures      . Cholecalciferol (VITAMIN D) 2000 UNITS CAPS Take 2  capsules by mouth daily.       . fish oil-omega-3 fatty acids 1000 MG capsule Take 2 g by mouth 2 (two) times daily.        . hydrocortisone (ANUSOL-HC) 2.5 % rectal cream Place 1 application rectally 2 (two) times daily as needed.  30 g  5  . hydrOXYzine (ATARAX/VISTARIL) 25 MG tablet Take 25 mg by mouth as needed.      [name] levothyroxine (SYNTHROID) 50 MCG tablet Take 1 tablet (50 mcg total) by mouth daily.  90 tablet  3  . meloxicam (MOBIC) 7.5 MG tablet Take 2 tablets by mouth daily  180 tablet  3  . metoprolol tartrate (LOPRESSOR) 25 MG tablet Take 1/2 tablet by mouth every morning  90 tablet  3  . Multiple Vitamins-Iron (MULTIVITAMIN/IRON PO) Take by mouth.        . niacin (NIASPAN) 500 MG CR tablet Take 1 tablet (500 mg total) by mouth at bedtime.  90 tablet  3  . omeprazole (PRILOSEC) 20 MG capsule Take 20 mg by mouth every other day.        . Oxycodone HCl 20 MG TABS 4 (four) times daily as needed. Take as directed per Dr. Vear Clock      . promethazine (PHENERGAN) 25 MG tablet Take 1 tablet (25 mg total) by mouth every 6 (six) hours as needed.  30 tablet  2  . simvastatin (ZOCOR) 40 MG tablet Take 1/2 tablet by mouth at bedtime  90 tablet  3  . [DISCONTINUED] cephALEXin (KEFLEX) 500 MG capsule Take 1 capsule (500 mg total) by mouth 4 (four) times daily.  28 capsule  0  . [DISCONTINUED] fluconazole (DIFLUCAN) 150 MG tablet as needed.       . [DISCONTINUED] ondansetron (ZOFRAN) 4 MG tablet Take 1 tablet (4 mg total) by mouth every 8 (eight) hours as needed for nausea.  30 tablet  2     Allergies  Allergen Reactions  . Mepergan (Meperidine-Promethazine)     Cardiac arrest  . Iohexol      Code: HIVES, Desc: pt states her face and throat swells when administered IV contrast - KB, Onset Date: 08657846   . Valsartan     REACTION: pt states INTOL to Diovan    Current Medications, Allergies, Past Medical History, Past Surgical History, Family History, and Social History were reviewed in  [facility] [facility] record.    Review of Systems         See HPI - all  other systems neg except as noted... The patient complains of dyspnea on exertion, muscle weakness, and difficulty walking.  The patient denies anorexia, fever, weight loss, weight gain, vision loss, decreased hearing, hoarseness, chest pain, syncope, peripheral edema, prolonged cough, headaches, hemoptysis, abdominal pain, melena, hematochezia, severe indigestion/heartburn, hematuria, incontinence, suspicious skin lesions, transient blindness, depression, unusual weight change, abnormal bleeding, enlarged lymph nodes, and angioedema.     Objective:   Physical Exam     WD, WN, 66 y/o WF in NAD... GENERAL:  Alert & oriented; pleasant & cooperative... HEENT:  [place]/AT, EOM-wnl, PERRLA, EACs-clear, TMs-wnl, NOSE-clear, THROAT-clear & wnl. NECK:  Supple w/ fairROM; no JVD; normal carotid impulses w/o bruits; no thyromegaly or nodules palpated; no lymphadenopathy. CHEST:  Clear to P & A; without wheezes/ rales/ or rhonchi. HEART:  Regular Rhythm; without murmurs/ rubs/ or gallops. ABDOMEN:  Soft & nontender; normal bowel sounds; no organomegaly or masses detected. EXT: s/p right TKR, mod arthritic changes; no varicose veins/ +venous insuffic/ tr edema. BACK:  scar of surgery, +trigger points... NEURO:  CN's intact;  no focal neuro deficits... DERM:  tiny red spot upper inner left arm bicep area from prev tick bite- no tick remnants... dry skin, no signif rash noted.  RADIOLOGY DATA:  Reviewed in the EPIC EMR & discussed w/ the patient...  LABORATORY DATA:  Reviewed in the EPIC EMR & discussed w/ the patient...   Assessment & Plan:    HBP>  BP controlled on simple med + diet & exercise; we decided to keep the Metoprolol25mg - 1/2 tab...  CHOL>  Followed in Lipid Clinic- TG still elev & instructed on better low fat diet; needs f/u w/ repeat FLP...  GI>  She denies GI issues at present on her vegan  diet...  GU>  Similarly the prev IC is improved & hasn't been on meds (Elmiron) for many months...  DJD/ FM/ Chr Pain>  She is followed in the Pain management clinic as noted above, & she had shot left knee from [facility]- improved; & shot in back from [employer] [facility] [facility].  MGUS>  Followed by [name], we do not have recent notes but she reports stable & he gave her some IV Fe previously & she felt sl better after this Rx.   Patient's Medications  New Prescriptions   No medications on file  Previous Medications   AMOXICILLIN (AMOXIL) 500 MG CAPSULE    Take as directed prior to procedures   CHOLECALCIFEROL (VITAMIN D) 2000 UNITS CAPS    Take 2 capsules by mouth daily.    FISH OIL-OMEGA-3 FATTY ACIDS 1000 MG CAPSULE    Take 2 g by mouth 2 (two) times daily.     HYDROXYZINE (ATARAX/VISTARIL) 25 MG TABLET    Take 25 mg by mouth as needed.   MULTIPLE VITAMINS-IRON (MULTIVITAMIN/IRON PO)    Take by mouth.     OMEPRAZOLE (PRILOSEC) 20 MG CAPSULE    Take 20 mg by mouth every other day.     OXYCODONE HCL 20 MG TABS    4 (four) times daily as needed. Take as directed per Dr. Vear Clock  Modified Medications   Modified Medication Previous Medication   ALPRAZOLAM (XANAX) 0.5 MG TABLET ALPRAZolam (XANAX) 0.5 MG tablet      Take 1/2 to 1 tablet by mouth three times daily as needed    Take 0.5 mg by mouth as needed.   HYDROCORTISONE (ANUSOL-HC) 2.5 % RECTAL CREAM hydrocortisone (ANUSOL-HC) 2.5 % rectal cream      Place 1  application rectally 2 (two) times daily as needed.    Place 1 application rectally 2 (two) times daily as needed.   LEVOTHYROXINE (SYNTHROID) 50 MCG TABLET levothyroxine (SYNTHROID) 50 MCG tablet      Take 1 tablet (50 mcg total) by mouth daily.    Take 1 tablet (50 mcg total) by mouth daily.   MELOXICAM (MOBIC) 7.5 MG TABLET meloxicam (MOBIC) 7.5 MG tablet      Take 2 tablets by mouth daily    Take 2 tablets by mouth daily   METOPROLOL TARTRATE (LOPRESSOR) 25 MG TABLET metoprolol tartrate  (LOPRESSOR) 25 MG tablet      Take 1/2 tablet by mouth every morning    Take 1/2 tablet by mouth every morning   NIACIN (NIASPAN) 500 MG CR TABLET niacin (NIASPAN) 500 MG CR tablet      Take 1 tablet (500 mg total) by mouth at bedtime.    Take 1 tablet (500 mg total) by mouth at bedtime.   PROMETHAZINE (PHENERGAN) 25 MG TABLET promethazine (PHENERGAN) 25 MG tablet      Take 1 tablet (25 mg total) by mouth every 6 (six) hours as needed.    Take 1 tablet (25 mg total) by mouth every 6 (six) hours as needed.   SIMVASTATIN (ZOCOR) 40 MG TABLET simvastatin (ZOCOR) 40 MG tablet      Take 1/2 tablet by mouth at bedtime    Take 1/2 tablet by mouth at bedtime  Discontinued Medications   CEPHALEXIN (KEFLEX) 500 MG CAPSULE    Take 1 capsule (500 mg total) by mouth 4 (four) times daily.   FLUCONAZOLE (DIFLUCAN) 150 MG TABLET    as needed.    ONDANSETRON (ZOFRAN) 4 MG TABLET    Take 1 tablet (4 mg total) by mouth every 8 (eight) hours as needed for nausea.

## 2012-08-03 NOTE — Patient Instructions (Addendum)
Today we updated your med list in our EPIC system...    Continue your current medications the same...  We refilled your meds per request...  Go ahead w/ the Citalopram 20mg /d per [name]...    The goal is to get a good night's sleep 7 awake feeling refreshed in the AM...  Try to gradually increase the activity level...  Call for any questions...  Let's continue our 6 month follow up visits.Marland KitchenMarland Kitchen

## 2012-08-03 NOTE — Telephone Encounter (Signed)
Called and spoke with pt and she is aware that we will set this appt up for her. Nothing further is needed.

## 2012-08-03 NOTE — Telephone Encounter (Signed)
I spoke with pt and she stated she tried to get in with Dr. Shelia Media for her fibromyalgia but was advised she needed a referral from her PCP. Please advise SN thanks

## 2012-08-15 ENCOUNTER — Ambulatory Visit: Payer: Medicare Other | Admitting: Pharmacist

## 2012-08-16 ENCOUNTER — Ambulatory Visit (INDEPENDENT_AMBULATORY_CARE_PROVIDER_SITE_OTHER): Payer: Medicare Other

## 2012-08-16 DIAGNOSIS — Z23 Encounter for immunization: Secondary | ICD-10-CM

## 2012-08-31 DIAGNOSIS — M79609 Pain in unspecified limb: Secondary | ICD-10-CM | POA: Diagnosis not present

## 2012-08-31 DIAGNOSIS — F329 Major depressive disorder, single episode, unspecified: Secondary | ICD-10-CM | POA: Diagnosis not present

## 2012-08-31 DIAGNOSIS — M25569 Pain in unspecified knee: Secondary | ICD-10-CM | POA: Diagnosis not present

## 2012-08-31 DIAGNOSIS — G8928 Other chronic postprocedural pain: Secondary | ICD-10-CM | POA: Diagnosis not present

## 2012-09-01 DIAGNOSIS — E559 Vitamin D deficiency, unspecified: Secondary | ICD-10-CM | POA: Diagnosis not present

## 2012-09-01 DIAGNOSIS — M25569 Pain in unspecified knee: Secondary | ICD-10-CM | POA: Diagnosis not present

## 2012-09-01 DIAGNOSIS — IMO0001 Reserved for inherently not codable concepts without codable children: Secondary | ICD-10-CM | POA: Diagnosis not present

## 2012-09-01 DIAGNOSIS — Z79899 Other long term (current) drug therapy: Secondary | ICD-10-CM | POA: Diagnosis not present

## 2012-09-01 DIAGNOSIS — M2550 Pain in unspecified joint: Secondary | ICD-10-CM | POA: Diagnosis not present

## 2012-09-01 DIAGNOSIS — R5381 Other malaise: Secondary | ICD-10-CM | POA: Diagnosis not present

## 2012-09-05 ENCOUNTER — Other Ambulatory Visit: Payer: Self-pay | Admitting: Pulmonary Disease

## 2012-09-05 DIAGNOSIS — D509 Iron deficiency anemia, unspecified: Secondary | ICD-10-CM

## 2012-09-05 DIAGNOSIS — D472 Monoclonal gammopathy: Secondary | ICD-10-CM

## 2012-09-05 MED ORDER — ALPRAZOLAM 0.5 MG PO TABS: ORAL_TABLET | ORAL | Status: DC

## 2012-09-05 NOTE — Telephone Encounter (Signed)
Refill has been sent to the pharmacy for the pt °

## 2012-09-06 ENCOUNTER — Telehealth: Payer: Self-pay | Admitting: Pulmonary Disease

## 2012-09-06 ENCOUNTER — Encounter: Payer: Medicare Other | Admitting: Gynecology

## 2012-09-06 NOTE — Telephone Encounter (Signed)
lmomtcb  

## 2012-09-06 NOTE — Telephone Encounter (Signed)
Returning call can be reached at [phone].[name]

## 2012-09-07 NOTE — Telephone Encounter (Signed)
Called and spoke with pt and she is aware that we will call and get the labs sent over in the morning and i will call her with any problems of these labs.  Pt stated ok to lmom with results or i can call her after 2 if i need to talk to her.

## 2012-09-07 NOTE — Telephone Encounter (Signed)
I spoke with the pt and she states that Dr. Kriste Basque referred her to Dr. Corliss Skains and she had some labs drawn there and was advised to call Dr. Kriste Basque back to review these results. She states she received a call from Dr. Zandra Abts nurse about this. Leigh do you have these results? Carron Curie, CMA

## 2012-09-07 NOTE — Telephone Encounter (Signed)
Pt called back again. She says she will leave town tomorrow and needs to know what meds to take. [name]

## 2012-09-07 NOTE — Telephone Encounter (Signed)
Pt returned call. Kathleen W Perdue  

## 2012-09-08 ENCOUNTER — Other Ambulatory Visit: Payer: Self-pay | Admitting: Sports Medicine

## 2012-09-08 ENCOUNTER — Ambulatory Visit
Admission: RE | Admit: 2012-09-08 | Discharge: 2012-09-08 | Disposition: A | Discharge status: Home / Self Care | Payer: Medicare Other | Source: Ambulatory Visit | Attending: Sports Medicine | Admitting: Sports Medicine

## 2012-09-08 DIAGNOSIS — M7989 Other specified soft tissue disorders: Secondary | ICD-10-CM

## 2012-09-08 DIAGNOSIS — L259 Unspecified contact dermatitis, unspecified cause: Secondary | ICD-10-CM | POA: Diagnosis not present

## 2012-09-08 NOTE — Telephone Encounter (Signed)
Called and requested that labs be faxed to our office.  Will call pt later after SN has reviewed.

## 2012-09-11 ENCOUNTER — Encounter: Payer: Self-pay | Admitting: *Deleted

## 2012-09-11 ENCOUNTER — Telehealth: Payer: Self-pay | Admitting: Pulmonary Disease

## 2012-09-11 NOTE — Telephone Encounter (Signed)
Forward 2 pages from Delbert Harness to Dr. Alroy Dust for review on 09-11-12 ym

## 2012-09-11 NOTE — Progress Notes (Signed)
Received fax from Dr. Cato Mulligan office at [facility] asking [place] to look at patients labs monoclonal proteins.  Spoke with Dr. Myna Hidalgo.  He has been monitoring her labs and her labwork has been unchanged for one year.  Called dr. Cato Mulligan office to let them know that we were aware of her labs and were monitoring the situation.

## 2012-09-11 NOTE — Telephone Encounter (Signed)
Spoke with pt on 12/20 and she is aware that per SN she will need to cont her medications.  SN will review the labs from Dr. Corliss Skains and we will call pt if anything needs to be changed.

## 2012-09-15 ENCOUNTER — Ambulatory Visit (INDEPENDENT_AMBULATORY_CARE_PROVIDER_SITE_OTHER): Payer: Medicare Other | Admitting: Gynecology

## 2012-09-15 ENCOUNTER — Encounter: Payer: Self-pay | Admitting: Gynecology

## 2012-09-15 VITALS — BP 128/82 | Ht 65.5 in | Wt 176.0 lb

## 2012-09-15 DIAGNOSIS — N898 Other specified noninflammatory disorders of vagina: Secondary | ICD-10-CM | POA: Diagnosis not present

## 2012-09-15 DIAGNOSIS — N952 Postmenopausal atrophic vaginitis: Secondary | ICD-10-CM

## 2012-09-15 DIAGNOSIS — M949 Disorder of cartilage, unspecified: Secondary | ICD-10-CM | POA: Diagnosis not present

## 2012-09-15 DIAGNOSIS — M8580 Other specified disorders of bone density and structure, unspecified site: Secondary | ICD-10-CM

## 2012-09-15 DIAGNOSIS — R82998 Other abnormal findings in urine: Secondary | ICD-10-CM | POA: Diagnosis not present

## 2012-09-15 DIAGNOSIS — M899 Disorder of bone, unspecified: Secondary | ICD-10-CM

## 2012-09-15 LAB — WET PREP FOR TRICH, YEAST, CLUE

## 2012-09-15 NOTE — Patient Instructions (Signed)
Follow-up in one year.

## 2012-09-15 NOTE — Progress Notes (Signed)
ANZLEIGH SLAVEN Jul 27, 1946 [id number]        66 y.o.  G0P0 for follow up exam.    Past medical history,surgical history, medications, allergies, family history and social history were all reviewed and documented in the EPIC chart. ROS:  Was performed and pertinent positives and negatives are included in the history.  Exam: Fleet Contras assistant Filed Vitals:   09/15/12 1425  BP: 128/82  Height: 5' 5.5" (1.664 m)  Weight: 176 lb (79.833 kg)   General appearance  Normal Skin grossly normal Head/Neck normal with no cervical or supraclavicular adenopathy thyroid normal Lungs  clear Cardiac RR, without RMG Abdominal  soft, nontender, without masses, organomegaly or hernia Breasts  examined lying and sitting without masses, retractions, discharge or axillary adenopathy. Pelvic  Ext/BUS/vagina  normal with atrophic changes  Adnexa  Without masses or tenderness    Anus and perineum  normal   Rectovaginal  normal sphincter tone without palpated masses or tenderness.    Assessment/Plan:  66 y.o. G0P0 female for annual exam.   1. Vaginal discharge. Patient complaining of a vaginal discharge slightly. Exam is overall normal in wet prep is negative. Patient will monitor. If worsens or other symptoms develop will represent. 2. Atrophic genital changes. Patient is asymptomatic and we'll continue to follow. 3. Osteopenia.  DEXA 08/2011 T score -1.2. FRAX 13%/1.2%. Repeat DEXA next year at two-year interval. Increase calcium vitamin D reviewed. 4. Pap smear 08/2010. No Pap smear done today. Patient is status post hysterectomy for benign indications in we have discussed the options of stop screening altogether. We'll readdress this on an annual basis. 5. Mammography 04/2012. Continue with annual mammography. SBE monthly reviewed. 6. Colonoscopy 4-5 years ago. We'll follow up with their recommended interval. 7. Health maintenance. No blood work done as it is all done through her primary physician's  office who she sees on regular basis. Follow up one year, sooner as needed.    Dara Lords MD, 3:03 PM 09/15/2012

## 2012-09-16 ENCOUNTER — Emergency Department (HOSPITAL_COMMUNITY)
Admission: EM | Admit: 2012-09-16 | Discharge: 2012-09-16 | Disposition: A | Discharge status: Home / Self Care | Payer: Medicare Other | Attending: Emergency Medicine | Admitting: Emergency Medicine

## 2012-09-16 ENCOUNTER — Emergency Department (HOSPITAL_COMMUNITY): Payer: Medicare Other

## 2012-09-16 ENCOUNTER — Encounter (HOSPITAL_COMMUNITY): Payer: Self-pay | Admitting: *Deleted

## 2012-09-16 DIAGNOSIS — E559 Vitamin D deficiency, unspecified: Secondary | ICD-10-CM | POA: Diagnosis not present

## 2012-09-16 DIAGNOSIS — Z8739 Personal history of other diseases of the musculoskeletal system and connective tissue: Secondary | ICD-10-CM | POA: Insufficient documentation

## 2012-09-16 DIAGNOSIS — M25519 Pain in unspecified shoulder: Secondary | ICD-10-CM

## 2012-09-16 DIAGNOSIS — K219 Gastro-esophageal reflux disease without esophagitis: Secondary | ICD-10-CM | POA: Diagnosis not present

## 2012-09-16 DIAGNOSIS — Z862 Personal history of diseases of the blood and blood-forming organs and certain disorders involving the immune mechanism: Secondary | ICD-10-CM | POA: Diagnosis not present

## 2012-09-16 DIAGNOSIS — Z8639 Personal history of other endocrine, nutritional and metabolic disease: Secondary | ICD-10-CM | POA: Insufficient documentation

## 2012-09-16 DIAGNOSIS — S4980XA Other specified injuries of shoulder and upper arm, unspecified arm, initial encounter: Secondary | ICD-10-CM | POA: Insufficient documentation

## 2012-09-16 DIAGNOSIS — Z872 Personal history of diseases of the skin and subcutaneous tissue: Secondary | ICD-10-CM | POA: Diagnosis not present

## 2012-09-16 DIAGNOSIS — Z87448 Personal history of other diseases of urinary system: Secondary | ICD-10-CM | POA: Insufficient documentation

## 2012-09-16 DIAGNOSIS — F411 Generalized anxiety disorder: Secondary | ICD-10-CM | POA: Diagnosis not present

## 2012-09-16 DIAGNOSIS — Z860100 Personal history of colon polyps, unspecified: Secondary | ICD-10-CM | POA: Insufficient documentation

## 2012-09-16 DIAGNOSIS — S46909A Unspecified injury of unspecified muscle, fascia and tendon at shoulder and upper arm level, unspecified arm, initial encounter: Secondary | ICD-10-CM | POA: Diagnosis not present

## 2012-09-16 DIAGNOSIS — Z79899 Other long term (current) drug therapy: Secondary | ICD-10-CM | POA: Insufficient documentation

## 2012-09-16 DIAGNOSIS — E7800 Pure hypercholesterolemia, unspecified: Secondary | ICD-10-CM | POA: Insufficient documentation

## 2012-09-16 DIAGNOSIS — M1990 Unspecified osteoarthritis, unspecified site: Secondary | ICD-10-CM | POA: Diagnosis not present

## 2012-09-16 DIAGNOSIS — W010XXA Fall on same level from slipping, tripping and stumbling without subsequent striking against object, initial encounter: Secondary | ICD-10-CM | POA: Insufficient documentation

## 2012-09-16 DIAGNOSIS — I10 Essential (primary) hypertension: Secondary | ICD-10-CM | POA: Diagnosis not present

## 2012-09-16 DIAGNOSIS — M79609 Pain in unspecified limb: Secondary | ICD-10-CM | POA: Diagnosis not present

## 2012-09-16 DIAGNOSIS — Y929 Unspecified place or not applicable: Secondary | ICD-10-CM | POA: Insufficient documentation

## 2012-09-16 DIAGNOSIS — Y9389 Activity, other specified: Secondary | ICD-10-CM | POA: Insufficient documentation

## 2012-09-16 DIAGNOSIS — F3289 Other specified depressive episodes: Secondary | ICD-10-CM | POA: Insufficient documentation

## 2012-09-16 DIAGNOSIS — Z8679 Personal history of other diseases of the circulatory system: Secondary | ICD-10-CM | POA: Diagnosis not present

## 2012-09-16 DIAGNOSIS — Z8719 Personal history of other diseases of the digestive system: Secondary | ICD-10-CM | POA: Insufficient documentation

## 2012-09-16 DIAGNOSIS — F329 Major depressive disorder, single episode, unspecified: Secondary | ICD-10-CM | POA: Diagnosis not present

## 2012-09-16 LAB — URINALYSIS W MICROSCOPIC + REFLEX CULTURE
Glucose, UA: NEGATIVE mg/dL
Nitrite: NEGATIVE
Protein, ur: NEGATIVE mg/dL
Squamous Epithelial / LPF: NONE SEEN
pH: 5 (ref 5.0–8.0)

## 2012-09-16 MED ORDER — IBUPROFEN 600 MG PO TABS: 600.0000 mg | ORAL_TABLET | Freq: Four times a day (QID) | ORAL | Status: DC | PRN

## 2012-09-16 NOTE — ED Notes (Signed)
Pt alert and oriented x4. Respirations even and unlabored, bilateral symmetrical rise and fall of chest. Skin warm and dry. In no acute distress. Denies needs.   

## 2012-09-16 NOTE — ED Provider Notes (Signed)
History     CSN: [id number]  Arrival date & time 09/16/12  1119   First MD Initiated Contact with Patient 09/16/12 1254      Chief Complaint  Patient presents with  . Arm Injury    upper rt    (Consider location/radiation/quality/duration/timing/severity/associated sxs/prior treatment) HPI Comments: This is a 66 year old female, who presents emergency department with chief complaint of right arm pain. Patient states she tripped while getting into the shower last night. She states that her right arm hurts with movement. It is not tender to the touch. She states that her pain is moderate to severe with movement, but not in any pain at rest. She takes Percocet daily for chronic pain, but she says that this has not helped.  The history is provided by the patient. No language interpreter was used.    Past Medical History  Diagnosis Date  . Sleep apnea   . Allergic rhinitis   . HTN (hypertension)   . Chest pain   . Palpitations   . Hypercholesterolemia   . GERD (gastroesophageal reflux disease)   . Diverticulosis of colon   . History of colonic polyps   . Cystitis   . DJD (degenerative joint disease)   . Lumbar back pain   . Fibromyalgia   . Osteopenia 08/2011    DEXA t score -1.2 FRAX 13%/1.2%  . Vitamin D deficiency   . Anxiety   . Depression   . Psoriasis   . Unspecified disorder of plasma protein metabolism   . IC (interstitial cystitis)   . Thyroid disease     Past Surgical History  Procedure Date  . Laminotomy 2010    L-4, L-5 FUSION  . Knee arthroscopy     RIGHT KNEE X 5  . Total knee arthroplasty 12-07 and 4-07    Dr. Shelle Iron, revision 12-09 Dr. Eileen Stanford at [facility]  . Patella fracture surgery 10-08    Dr. Charlann Boxer  . Posterior laminectomy / decompression lumbar spine 9-09    Dr. Jordan Likes  . Cholecystectomy   . Hand surgery     TRIGGER FINGER  . Hernia repair   . Laparoscopic oophorectomy 2010    BILATERAL  . Abdominal hysterectomy     for prolapse with abdominal  incision  . Oophorectomy     BSO    Family History  Problem Relation Age of Onset  . Coronary artery disease    . Lupus    . Hypertension Mother   . Hypertension Father   . Breast cancer Sister   . Hypertension Sister   . Leukemia Brother   . Lung disease Brother   . Cancer Brother     Melanoma    History  Substance Use Topics  . Smoking status: Never Smoker   . Smokeless tobacco: Never Used  . Alcohol Use: No    OB History    Grav Para Term Preterm Abortions TAB SAB Ect Mult Living   0               Review of Systems  All other systems reviewed and are negative.    Allergies  Mepergan; Iohexol; and Valsartan  Home Medications   Current Outpatient Rx  Name  Route  Sig  Dispense  Refill  . ALPRAZOLAM 0.5 MG PO TABS   Oral   Take 0.5 mg by mouth at bedtime.         [name] VITAMIN D 2000 UNITS PO CAPS   Oral   Take  2 capsules by mouth daily.          . OMEGA-3 FATTY ACIDS 1000 MG PO CAPS   Oral   Take 2 g by mouth daily.          [name] LEVOTHYROXINE SODIUM 50 MCG PO TABS   Oral   Take 1 tablet (50 mcg total) by mouth daily.   90 tablet   3   . MELOXICAM 7.5 MG PO TABS   Oral   Take 15 mg by mouth daily. Take 2 tablets by mouth daily         . METOPROLOL TARTRATE 25 MG PO TABS   Oral   Take 12.5 mg by mouth every morning. Take 1/2 tablet by mouth every morning         . MULTIVITAMIN/IRON PO   Oral   Take 1 tablet by mouth daily.          [name] NIACIN ER (ANTIHYPERLIPIDEMIC) 500 MG PO TBCR   Oral   Take 1 tablet (500 mg total) by mouth at bedtime.   90 tablet   3   . OMEPRAZOLE 20 MG PO CPDR   Oral   Take 20 mg by mouth every other day.           . OXYCODONE HCL 20 MG PO TABS   Oral   Take 1 tablet by mouth 4 (four) times daily as needed. PAIN.Marland KitchenMarland KitchenTake as directed per Dr. Vear Clock         . SIMVASTATIN 40 MG PO TABS   Oral   Take 20 mg by mouth at bedtime. Take 1/2 tablet by mouth at bedtime         . IBUPROFEN 600 MG PO TABS    Oral   Take 1 tablet (600 mg total) by mouth every 6 (six) hours as needed for pain.   30 tablet   0     BP 108/68  Pulse 73  Temp 97.7 F (36.5 C) (Oral)  Resp 16  SpO2 96%  Physical Exam  Nursing note and vitals reviewed. Constitutional: She is oriented to person, place, and time. She appears well-developed and well-nourished.  HENT:  Head: Normocephalic and atraumatic.  Eyes: Conjunctivae normal and EOM are normal.  Neck: Normal range of motion.  Cardiovascular: Normal rate.   Pulmonary/Chest: Effort normal.  Abdominal: She exhibits no distension.  Musculoskeletal: Normal range of motion.       Right shoulder without abnormality, or deformity. Range of motion is limited secondary to pain. Strength is limited secondary to pain. Empty can test is negative, Leanord Asal test negative.  Neurological: She is alert and oriented to person, place, and time.  Skin: Skin is dry.  Psychiatric: She has a normal mood and affect. Her behavior is normal. Judgment and thought content normal.    ED Course  Procedures (including critical care time)  Labs Reviewed - No data to display Dg Shoulder Right  09/16/2012  *RADIOLOGY REPORT*  Clinical Data: Arm injury, pain.  RIGHT SHOULDER - 2+ VIEW  Comparison: None.  Findings: No acute bony abnormality.  Specifically, no fracture, subluxation, or dislocation.  Soft tissues are intact. Joint spaces are maintained.  Normal bone mineralization.  IMPRESSION: No acute bony abnormality.   Original Report Authenticated By: Charlett Nose, M.D.    Dg Humerus Right  09/16/2012  *RADIOLOGY REPORT*  Clinical Data: Fall, arm injury.  RIGHT HUMERUS - 2+ VIEW  Comparison: None.  Findings: No acute bony abnormality.  Specifically,  no fracture, subluxation, or dislocation.  Soft tissues are intact.  IMPRESSION: No acute bony abnormality.   Original Report Authenticated By: Charlett Nose, M.D.      1. Shoulder pain       MDM  66 year old female with  right shoulder pain. I'm going to discharge the patient home with ibuprofen, Melvenia Beam has Percocet, will encourage her to followup with orthopedics. She understands and agrees with the plan. She stable and her for discharge.        Roxy Horseman, PA-C 09/16/12 1557

## 2012-09-16 NOTE — ED Notes (Signed)
Pt escorted to discharge window. Pt verbalized understanding discharge instructions. In no acute distress.  

## 2012-09-16 NOTE — ED Notes (Signed)
Pt tripped going into the shower, hurt rt upper arm, can not raise arm, no visible signs of injury

## 2012-09-17 LAB — URINE CULTURE: Colony Count: 2000

## 2012-09-17 NOTE — ED Provider Notes (Signed)
Medical screening examination/treatment/procedure(s) were performed by non-physician practitioner and as supervising physician I was immediately available for consultation/collaboration.   Gavin Pound. [name], MD 09/17/12 [phone]

## 2012-09-19 DIAGNOSIS — R109 Unspecified abdominal pain: Secondary | ICD-10-CM | POA: Diagnosis not present

## 2012-09-19 DIAGNOSIS — N3010 Interstitial cystitis (chronic) without hematuria: Secondary | ICD-10-CM | POA: Diagnosis not present

## 2012-09-25 ENCOUNTER — Telehealth: Payer: Self-pay | Admitting: Pulmonary Disease

## 2012-09-25 NOTE — Telephone Encounter (Signed)
Pt states that she has been short of breath for about a week now. She reports a slight cough with no production of mucus. Has been taking Levothyroxine and she read on the insert that comes with the medication that it could cause shortness of breath. Pt is wanting SN recommendations.  Please advise.

## 2012-09-25 NOTE — Telephone Encounter (Signed)
Called the pt and the phone call got disconnected. Will try back later.

## 2012-09-25 NOTE — Telephone Encounter (Signed)
Pt aware and will call back for OV if keeps happening.

## 2012-09-25 NOTE — Telephone Encounter (Signed)
Per SN---he has never seen SOB from thyroid meds.  She will need rov for SOB eval if this persists.  With either SN or TP.  thanks

## 2012-09-28 ENCOUNTER — Encounter: Payer: Self-pay | Admitting: Adult Health

## 2012-09-28 ENCOUNTER — Ambulatory Visit (INDEPENDENT_AMBULATORY_CARE_PROVIDER_SITE_OTHER): Payer: Medicare Other | Admitting: Adult Health

## 2012-09-28 ENCOUNTER — Other Ambulatory Visit (INDEPENDENT_AMBULATORY_CARE_PROVIDER_SITE_OTHER): Payer: Medicare Other

## 2012-09-28 ENCOUNTER — Ambulatory Visit (INDEPENDENT_AMBULATORY_CARE_PROVIDER_SITE_OTHER)
Admission: RE | Admit: 2012-09-28 | Discharge: 2012-09-28 | Disposition: A | Discharge status: Home / Self Care | Payer: Medicare Other | Source: Ambulatory Visit | Attending: Adult Health | Admitting: Adult Health

## 2012-09-28 VITALS — BP 124/84 | HR 67 | Temp 96.7°F | Ht 65.5 in | Wt 175.2 lb

## 2012-09-28 DIAGNOSIS — E039 Hypothyroidism, unspecified: Secondary | ICD-10-CM | POA: Diagnosis not present

## 2012-09-28 DIAGNOSIS — R0989 Other specified symptoms and signs involving the circulatory and respiratory systems: Secondary | ICD-10-CM

## 2012-09-28 DIAGNOSIS — F411 Generalized anxiety disorder: Secondary | ICD-10-CM | POA: Diagnosis not present

## 2012-09-28 DIAGNOSIS — R0600 Dyspnea, unspecified: Secondary | ICD-10-CM

## 2012-09-28 DIAGNOSIS — R0609 Other forms of dyspnea: Secondary | ICD-10-CM

## 2012-09-28 DIAGNOSIS — R062 Wheezing: Secondary | ICD-10-CM | POA: Diagnosis not present

## 2012-09-28 NOTE — Patient Instructions (Addendum)
I will call with xray and lab results.  May increase Xanax Twice daily  As needed  Anxiety  Please contact office for sooner follow up if symptoms do not improve or worsen or seek emergency care  follow up Dr. Kriste Basque  In 6 weeks and As needed

## 2012-09-28 NOTE — Assessment & Plan Note (Signed)
Generalized anxiety disorder. The fragments of her symptoms are related to this, however, we'll check chest x-ray today along with a TSH to rule out other etiology. Advised her on stress reducers and she may increase her Xanax up to twice daily. If needed. If continued symptoms. May need to evaluate further and decide on possible SSRI and/or referral to counseling.

## 2012-09-28 NOTE — Assessment & Plan Note (Signed)
Hypothyroid now on, Synthroid. We'll check TSH level. Pending these results will decide on dose adjustments.

## 2012-09-28 NOTE — Progress Notes (Signed)
Subjective:    Patient ID: [name], female    DOB: 10-Sep-1946, 67 y.o.   MRN: [id number]  HPI 67 y/o WF hx mult med problems and is well known to [name]... also followed in the Lipid Clinic, and by numerous specialists as noted below...   ~  Jan 26, 2010:  Sleep eval [name] w/ 11/10 study showing AHI= 25 w/ obstructive & central events... CPAP titration was suboptimal & she was tried on BiPAP... she tells me that she stopped all this when she lost weight & husb confirms no snoring or sleep disordered breathing- rests well etc...  she saw [name] for f/u MGUS & rising IgM level> he felt she was stable compared to his old values & rec CT Scans to r/o lymphoma (done 4/11 & neg- no lesions seen, mild hepatomegaly noted)...  she also followed by [name] for Urology w/ IC> on Elmiron orally & in bladder (she does self caths now)...  also followed by [name] in the Pain Center now on referral from Ortho> tried on NUCYNTA ("too expensive")...  also notes improvement in FLP/ lipid clinic f/u on Simva40 & weight loss via diet etc...  ~  May 28, 2010:  states tick bite 8/18 "[name] says it was a deer tic" on upper inner left bicep area... given Doxy x7d per [name] & no systemic symptoms (no HA, fever, change in FM symptoms) but notes rash, itching, sweats, & pain in arm... on exam> no signif rash, sm red spot where she says tick was removed (no resid parts left), no erythema migrans etc... we discussed checking CBC & Lyme titers; Rx w/ Atarax for itching.  ~  July 29, 2010:  states she's doing "so-so"> pain management & [facility] (DrJinnah- Ortho) sent her to [facility]- [name] for another opinion about her knee- notes pending & she requiring Oxycodone 15mg  Tid for relief...  BP is well controlled on meds;  she denies CP & notes rare self-lim palpit, SOB at baseline & too sedentary due to ortho probs;  Lipids stable on Simva40;  GI stable on numerous meds;  & continues on Alpraz 3/d for  nerves...  ~  Jan 27, 2011:  58mo ROV & she is basically stable> still fatigued and now c/o itching, no rash, & we discussed Atarax for Prn use;  She feels she has improved on vegan diet along w/ her husb [facility];  She is still seeing Pain Management & says "they say I have inflammation in my body";  She recently saw [name] for her 58mo f/u Heme/Onc testing...  ~  August 02, 2011:  58mo ROV & CPX per pt request> she is doing well on husb's vegan diet & wt down 12# further to 166# today w/ BMI=26; still c/o tired, fatigued- but better after IV Fe by [name];  BP improved on Metoprolol & she wants to decr to 1/d;  FLP followed in the Lipid Clinic on Simva, Niacin, Fish Oil;  States bowels are improved on carrot/spinach juiced by husb daily;  Still followed by pain management & taking Percocet 20mg  every 4H (ave 3-4 per day) she says;  See prob list below>>  ~  Feb 01, 2012:  58mo ROV & [name] continues to feel terrible overall- states she is in constant pain from her knees & legs; on Oxcodone per [name] pain clinic (states she is taking 3-6 tabs daily); Husb is inquiring about 2 clinics he found on the internet one in  [place] & one in [place] that do peripheral nerve surgery (I told him he was grasping & needed to discuss w/ her specialists if serious); she brought a BP log & pressures are ok but pulse is running low on her BBlocker so we agreed w/ decr Metoprolol 25mg - 1/2 Bid;  She is followed in the Lipid Clinic prev on Simva plus Niasp & Fish Oil, but husb wants her off Statins, they compromised & decr the Simva to 20mg /d & f/u labs pending...  We reviewed prob list, meds, xrays and labs>  See below>>    She saw DrFontaine, Gyn> routine check, atrophic changes, osteopenia- f/u BMD done 12/12 w/ lowest TScore -1.2 in right FemNeck...  ~  August 03, 2012:  66mo ROV & [name] continues to have a very difficult time w/ her FM, chronic pain syndrome, etc; she is going to contact  DrDeveshwar for a follow up rheum eval....    HxOSA, AR, Sinusitis> c/o recent sore throat/ URI treated w/ Keflex, MMW, Align & finally better...    HBP, CP, Palpit> on Metop25-1/2Qam; BP=140/82 today & 120s/60s at home; she denies recent CP, palpit, ch in SOB, edema, etc...    Chol> on Simva40-1/2daily & Niacin500; followed in the LC- last FLP 12/12 showed TChol 113, TG 87, HDL 36, LDL 60    Hypothyroid> on ZOXWRUEAV40; TP started med 10/13 when TSH=5.93; pt notes she's not feeling any better; we discussed recheck TSH on return...    GI> GERD, Divertics, Polyps> on Prilosec20, Phen25 prn, AnusolHC; she denies abd pain, notes some intermit nausea, no d/c/ blood seen...    GU> IC> prev treatment w/ DMSO; states voiding OK, denies pain etc...    DJD, LBP, FM, Chr Pain Syndrome> followed by [name] on Oxycodone 20Qid prn, Mobic 7.5/d; and DrIbazedo- seen 9/13 w/ shot in back (helped)...    Osteopenia> on Ca, MVI, VitD; last BMD 12/12 showed TScore +0.7 in Spine, and -1.2 in right FemNeck...    Anxiety> on Alprazolam 0.5mg Tid prn...    MGUS> followed by [name] for IgM monoclonal gammopathy- seen 10/13 & labs w/o signif change x Fe sl low (pt rec to incr Fe in diet she says)... We reviewed prob list, meds, xrays and labs> see below for updates >>  LABS 10/13:  Chems- ok w/ BS=115;  CBC- wnl;  Fe=36 (sat11%);  Ferritin=37; +Monoclonal IgM kappa paraprotein (no change in levels)...  09/28/2012 Acute OV  Complains of jittery/shakiness x44months, worse x3weeks - SOB, feels nervous .  Patient complains that over the last few months that she is still very jittery and nervous.  She believes this is all related to her beginning Synthroid. She wants her thyroid rechecked today. Patient was found to have a TSH of 5.92 and was started on Synthroid 50 mcg. Patient denies any hemoptysis, chest pain, palpitations, syncope, presyncopal episodes. She also, complains of some intermittent shortness, of breath on and  off this single and on for several months to year. Seems to be notes more noticeable since starting Synthroid. She takes Xanax most days, and occasionally takes it twice a day. Which does help with her anxiety             Problem List:  UNSPECIFIED SLEEP APNEA (ICD-780.57) - Complex sleep apnea - PSG 07/25/09 AHI 28 > ~  Sleep eval [name] w/ 11/10 study showing AHI= 25 w/ obstructive & central events... CPAP titration was suboptimal & she was tried on BiPAP... she tells me that she stopped all  this when she lost weight & husb confirms no snoring or sleep disordered breathing- rests well etc...   ALLERGIC RHINITIS (ICD-477.9) Hx of SINUSITIS (ICD-473.9) - Hx sinusitis [name]...  HYPERTENSION (ICD-401.9) - on METOPROLOL 25mg  Qam now ... she was hosp 3/09 w/ gastroenteritis & BP meds were stopped due to weakness; grad restarted w/ improvement in BP & palpit... ~  11/11: BP= 122/70 she is INTOL to Diovan; denies HA, visual symptoms, CP, palpit, dizzy, syncope, edema; husb notes that BPs are similar at home ~120/70's... ~  5/12:  BP= 112/70 & she has the same chr fatigue complaints, but no specific symptoms... ~  11/12:  BP=130/80 & she has the same chronic complaints but denies CP, syncope, ch in SOB, edema; [facility]; she wants to decr the Metoprolol to 25mg  daily- ok... ~  CXR 11/12 showed clear lungs, normal heart size, mild DJD TSpine... ~  5/13:  BP= 142/80 & she persists w/ mult chronic complaints... ~  11/13:  BP= 140/82 & she is clinically stable...  CHEST PAIN (ICD-786.50) - seen by [name] in 2006... ~  2DEcho 11/98 showed norm valves, norm LVF, ? min LVH... ~  cath 7/03 w/ norm coronaries, & norm LVF... ~  NuclearStressTest 2/06 showed no infarct, no ischemia, EF=65%... ~  Myoview repeated 4/10: low risk study w/ soft tissue attenuation, normal wall motion, EF= 78%... ~  EKG 11/12 showed NSR, WNL.Marland KitchenMarland Kitchen  PALPITATIONS, HX OF (ICD-V12.50) - notes  occas fluttering and "jello feeling" in her chest... Metoprolol helps.  HYPERCHOLESTEROLEMIA (ICD-272.0) - on ZOCOR 40mg Qhs + Fish Oil  + OTC Niacin> followed in the LC. ~  FLP 01/03/08 on SIMVASTATIN 20mg /d showed TChol 206, TG 182, HDL 31, LDL 145... ~  FLP 6/09 (on Simva40) showed TChol 173, TG 199, HDL 31, LDL 102... continue f/u in lipid clinic. ~  FLP 11/09 (on Simva40) showed TChol 196, TG 265, HDL 32, LDL 119... she stopped lipid clinic. ~  Due to continued "side effects" she placed the Simvastatin on hold, then restarted 40mg /d... ~  FLP 6/10 showed TChol 218, TG 394, HDL 33, LDL 129...  ~  FLP 11/10 showed TChol 262, TG 568, HDL 31, LDL 144... she needs to ret to the Lipid Clinic. ~  FLP 1/11 after 20# wt loss showed TChol 144, TG 181, HDL 43, LDL 65 on Simva40 ~  FLP 10/11 on Simva40+FishOil showed TChol 145, TG 237, HDL 33, LDL 79 ~  FLP 2/12 on Simva40+FishOil showed TChol 126, TG 238, HDL 27, LDL 66 ~  FLP 6/12 in EPIC per LC showed TChol 160, TG 226, HDL 31, LDL 92... Continue meds, low fat diet, exercise. ~  FLP 12/12 by LC on Simva40+Niasp500 showed TChol 113, TG 87, HDL 36, LDL 60 ~  She continues regular f/u in Lipid Clinic...  GERD (ICD-530.81) - on PRILOSEC 20mg /d, and Phenergan as needed... ~  prev EGD 8/05 showed 3cmHH, gastric polyp... prev EGD w/ stricture dilated. ~  EGD repeated 12/09 & showed 5cm HH w/ healing ulcer, esophagitis, gastritis, etc... HPylori neg... REC> continue Prilosec, Phenergan. ~  she saw [name], [name] 8/10 for eval of nausea- no help from Reglan, Scopalamine, etc... had UGI/ Ba swallow- sm HH, mild reflux, norm peristalsis;  Gastric Emptying Scan- normal;  EGD 9/10 by [facility] w/ esoph ulcer seen, neg CLO, benign gastric polyp removed, Rx'd PPI. ~  5/12:  She states that she no longer has any GI  issues on her vegan diet... ~  11/12:  She notes that Gabriel Rung gives her juiced carrots and spinach daily...  DIVERTICULOSIS OF COLON (ICD-562.10) - Rx  w/ PROCTOSOL HC Cream Prn, and stool softeners... COLONIC POLYPS (ICD-211.3) - colonoscopy 8/07 by [name] showed divertics only...  CYSTITIS (ICD-595.9) - she was seen at [facility] for poss interstitial cystitis... prev DMSO Rx. ~  Eval by [name] & pt tells me she is on Elmiron both orally & via bladder (self-cath). ~  9/11:  she reports IC is quiet & off Elmiron Rx now. ~  11/12:  She confirms min symptoms at present...  DEGENERATIVE JOINT DISEASE (ICD-715.90) - prev Rx MOBIC 7.5mg  Prn, now on OXYCODONE- taking daily for her pain per [name]... she had a right TKR 12/07 by [name]... then required right Patella surg 10/08 from [name]... now s/p right TKR revision by DrJinna at [facility]- they referred to pain management... ~  9/11: she tells me that [name] is referring her to [facility] for another opinion regarding her knees ([name]- there was nothing they could do & rec Pain Management, [facility], & veggie diet). ~  11/12:  She reports left knee bone-on-bone w/ shot from [facility] 10/12 which helped; still followed by Pain Management & she tells me she's taking Percocet 10mg  every 4H (ave 3-4 per day). ~  11/13:  She continues to follow up w/ [name] pain clinic 7 reports taking Oxycod 20mg  3-4 tabs daily, plus Mobic7.5.Marland KitchenMarland Kitchen  BACK PAIN, LUMBAR (ICD-724.2) - she had a right L4-5 decompressive laminotomy w/ foraminotomy 12/02 by DrPool... then L4-5 decompression & fusion 9/09 by DrPool due to DDD w/ synovial cyst & degen spondylolithesis w/ stenosis... f/u XRays and MRI 5/10 showed fusion intact, NAD.[name]. ~  CHRONIC PAIN MANAGEMENT> followed by [name] clinic now, on OXYCODONE 10-20mg  every 4H prn (ave 3-4/d she says), plus MOBIC 7.5mg /d. ~  9/13:  She reports that she had ESI from DrIbazedo, w/ some improvement...  FIBROMYALGIA (ICD-729.1) - she continues to have mult somatic complaints... she has seen DrDeveshwar in the past...  ~  She reports trial of Accupuncture> it  helped the FM but not the knee pain. ~  husb has inquired about periph nerve surg he researched on the internet, he is referred to her specialists to discuss options... ~  11/13:  They indicate that they have a f/u appt w/ DrDeveshwar for Rheum...  OSTEOPENIA (ICD-733.90) - on calcium, MVI, Vit D... VITAMIN D DEFICIENCY (ICD-268.9) -  ~  Vit D level was 5 (30-90) 11/08 & rec to start VitD 50K/wk... ~  Vit D level 11/09 = 45  ~  Vit D level 6/10 = 23 ~  Vit D level 11/10 = 35 ~  Vit D level 5/11 = 70... switched to Vit D 2000u daily. ~  BMD 12/12 by DrFontaine showed lowest measured site= right FemNeck -1.2  ANXIETY (ICD-300.00) - on ALPRAZOLAM 0.5mg  as needed- taking 3 per day recently (she notes mild tremor & this helps). DEPRESSION (ICD-311)  Hx of PSORIASIS (ICD-696.1)   Hx of UNSPECIFIED DISORDER PLASMA PROTEIN METABOLISM (ICD-273.9) - she has a biclonal gammopathy evaluated 10/05 by [name] who thought it was reactive, perhaps a MGUS variant... ~  f/u labs 11/09 showed Hg= 12.2, TProt= 7.1, SPE/ IEP w/ biclonal IgM kappa protein present accounting for 0.31g/dl of the total 0.86 g/dl of protein in the gamma region & IgM level = 319 mg/dl... ~  labs 11/10 showed Hg= 12.5, TProt= 7.8, SPEP/IEP  w/ monoclonal IgM kappa & IgM lamba proteins accounting for 0.44 g/dl of the total 1.61 g/dl in the gamma region... IgM level = 490mg /dl... ~  11/10:  Referred back to [name] who has resumed follow up of this problem... ~  4/11:  [name] did CT Chest/ Abd/ Pelvis> neg w/o adenopathy etc... ~  2012> she was given IV iron by [name] & reports feeling some better... ~  11/12:  Labs here showed Hg= 13.7, Fe= 61 (18%sat) ~  Labs from [employer] (once yearly in the fall) reviewed in EPIC flow sheet ==> IgM monoclonal gammopathy w/ stable parameters.   Past Surgical History  Procedure Date  . Laminotomy 2010    L-4, L-5 FUSION  . Knee arthroscopy     RIGHT KNEE X 5  . Total knee  arthroplasty 12-07 and 4-07    Dr. Shelle Iron, revision 12-09 Dr. Eileen Stanford at [facility]  . Patella fracture surgery 10-08    Dr. Charlann Boxer  . Posterior laminectomy / decompression lumbar spine 9-09    Dr. Jordan Likes  . Cholecystectomy   . Hand surgery     TRIGGER FINGER  . Hernia repair   . Laparoscopic oophorectomy 2010    BILATERAL  . Abdominal hysterectomy     for prolapse with abdominal incision  . Oophorectomy     BSO    Outpatient Encounter Prescriptions as of 09/28/2012  Medication Sig Dispense Refill  . ALPRAZolam (XANAX) 0.5 MG tablet Take 0.5 mg by mouth at bedtime.      . Cholecalciferol (VITAMIN D) 2000 UNITS CAPS Take 2 capsules by mouth daily.       . fish oil-omega-3 fatty acids 1000 MG capsule Take 2 g by mouth daily.       [name] ibuprofen (ADVIL,MOTRIN) 600 MG tablet Take 1 tablet (600 mg total) by mouth every 6 (six) hours as needed for pain.  30 tablet  0  . levothyroxine (SYNTHROID) 50 MCG tablet Take 1 tablet (50 mcg total) by mouth daily.  90 tablet  3  . meloxicam (MOBIC) 7.5 MG tablet Take 15 mg by mouth daily. Take 2 tablets by mouth daily      . metoprolol tartrate (LOPRESSOR) 25 MG tablet Take 1/2 tablet by mouth every morning      . Multiple Vitamins-Iron (MULTIVITAMIN/IRON PO) Take 1 tablet by mouth daily.       . niacin (NIASPAN) 500 MG CR tablet Take 1 tablet (500 mg total) by mouth at bedtime.  90 tablet  3  . omeprazole (PRILOSEC) 20 MG capsule Take 20 mg by mouth every other day.        . Oxycodone HCl 20 MG TABS Take 1 tablet by mouth 4 (four) times daily as needed. PAIN.Marland KitchenMarland KitchenTake as directed per Dr. Vear Clock      . simvastatin (ZOCOR) 40 MG tablet Take 1/2 tablet by mouth at bedtime         Allergies  Allergen Reactions  . Mepergan (Meperidine-Promethazine)     Cardiac arrest  . Iohexol      Code: HIVES, Desc: pt states her face and throat swells when administered IV contrast - KB, Onset Date: [id number]   . Valsartan     REACTION: pt states INTOL to Diovan    Current  Medications, Allergies, Past Medical History, Past Surgical History, Family History, and Social History were reviewed in [employer] record.    Review of Systems         Constitutional:   No  weight loss, night sweats,  Fevers, chills,  +fatigue, or  lassitude.  HEENT:   No headaches,  Difficulty swallowing,  Tooth/dental problems, or  Sore throat,                No sneezing, itching, ear ache, nasal congestion, post nasal drip,   CV:  No chest pain,  Orthopnea, PND, swelling in lower extremities, anasarca, dizziness, palpitations, syncope.   GI  No heartburn, indigestion, abdominal pain, nausea, vomiting, diarrhea, change in bowel habits, loss of appetite, bloody stools.   Resp:   No excess mucus, no productive cough,  No non-productive cough,  No coughing up of blood.  No change in color of mucus.  No wheezing.  No chest wall deformity  Skin: no rash or lesions.  GU: no dysuria, change in color of urine, no urgency or frequency.  No flank pain, no hematuria   MS:  No joint pain or swelling.  No decreased range of motion.  No back pain.  Psych:     No memory loss.       Objective:   Physical Exam     WD, WN, 67 y/o WF in NAD... GENERAL:  Alert & oriented; pleasant & cooperative... HEENT:  [place]/AT,  EACs-clear, TMs-wnl, NOSE-clear, THROAT-clear & wnl. NECK:  Supple w/ fairROM; no JVD; normal carotid impulses w/o bruits; no thyromegaly or nodules palpated; no lymphadenopathy. CHEST:  Clear to P & A; without wheezes/ rales/ or rhonchi. HEART:  Regular Rhythm; without murmurs/ rubs/ or gallops. ABDOMEN:  Soft & nontender; normal bowel sounds; no organomegaly or masses detected. EXT: s/p right TKR, mod arthritic changes; no varicose veins/ +venous insuffic/ tr edema.  NEURO:  no focal neuro deficits...   Assessment & Plan:

## 2012-09-29 ENCOUNTER — Telehealth: Payer: Self-pay | Admitting: Adult Health

## 2012-09-29 MED ORDER — ALPRAZOLAM 0.5 MG PO TABS: 0.5000 mg | ORAL_TABLET | Freq: Two times a day (BID) | ORAL | Status: DC

## 2012-09-29 NOTE — Addendum Note (Signed)
Addended by: Boone Master E on: 09/29/2012 12:46 PM   Modules accepted: Orders

## 2012-09-29 NOTE — Progress Notes (Signed)
Quick Note:  Called, spoke with pt's husband. He will ask pt to call office back. ______

## 2012-09-29 NOTE — Progress Notes (Signed)
Quick Note:  Lm with pt's husband to have her call office back. ______

## 2012-09-29 NOTE — Telephone Encounter (Signed)
Notes Recorded by Julio Sicks, NP on 09/28/2012 at 6:01 PM Xray is fine  Nothing to explain dyspnea  Please contact office for sooner follow up if symptoms do not improve or worsen or seek emergency care   Spoke with pt and notified of results/recs per TP She verbalized understanding and denied any questions Nothing further needed

## 2012-10-02 ENCOUNTER — Telehealth: Payer: Self-pay | Admitting: Pulmonary Disease

## 2012-10-02 ENCOUNTER — Emergency Department (HOSPITAL_COMMUNITY): Payer: Medicare Other

## 2012-10-02 ENCOUNTER — Emergency Department (HOSPITAL_COMMUNITY)
Admission: EM | Admit: 2012-10-02 | Discharge: 2012-10-02 | Disposition: A | Discharge status: Home / Self Care | Payer: Medicare Other | Attending: Emergency Medicine | Admitting: Emergency Medicine

## 2012-10-02 ENCOUNTER — Encounter (HOSPITAL_COMMUNITY): Payer: Self-pay | Admitting: *Deleted

## 2012-10-02 DIAGNOSIS — M545 Low back pain, unspecified: Secondary | ICD-10-CM | POA: Insufficient documentation

## 2012-10-02 DIAGNOSIS — R5381 Other malaise: Secondary | ICD-10-CM | POA: Diagnosis not present

## 2012-10-02 DIAGNOSIS — E559 Vitamin D deficiency, unspecified: Secondary | ICD-10-CM | POA: Insufficient documentation

## 2012-10-02 DIAGNOSIS — L408 Other psoriasis: Secondary | ICD-10-CM | POA: Insufficient documentation

## 2012-10-02 DIAGNOSIS — Z8669 Personal history of other diseases of the nervous system and sense organs: Secondary | ICD-10-CM | POA: Insufficient documentation

## 2012-10-02 DIAGNOSIS — M6281 Muscle weakness (generalized): Secondary | ICD-10-CM | POA: Insufficient documentation

## 2012-10-02 DIAGNOSIS — R413 Other amnesia: Secondary | ICD-10-CM | POA: Insufficient documentation

## 2012-10-02 DIAGNOSIS — Z79899 Other long term (current) drug therapy: Secondary | ICD-10-CM | POA: Diagnosis not present

## 2012-10-02 DIAGNOSIS — M1990 Unspecified osteoarthritis, unspecified site: Secondary | ICD-10-CM | POA: Diagnosis not present

## 2012-10-02 DIAGNOSIS — F329 Major depressive disorder, single episode, unspecified: Secondary | ICD-10-CM | POA: Insufficient documentation

## 2012-10-02 DIAGNOSIS — I10 Essential (primary) hypertension: Secondary | ICD-10-CM | POA: Diagnosis not present

## 2012-10-02 DIAGNOSIS — F411 Generalized anxiety disorder: Secondary | ICD-10-CM | POA: Diagnosis not present

## 2012-10-02 DIAGNOSIS — R079 Chest pain, unspecified: Secondary | ICD-10-CM | POA: Insufficient documentation

## 2012-10-02 DIAGNOSIS — E079 Disorder of thyroid, unspecified: Secondary | ICD-10-CM | POA: Insufficient documentation

## 2012-10-02 DIAGNOSIS — E860 Dehydration: Secondary | ICD-10-CM | POA: Diagnosis not present

## 2012-10-02 DIAGNOSIS — G4730 Sleep apnea, unspecified: Secondary | ICD-10-CM | POA: Insufficient documentation

## 2012-10-02 DIAGNOSIS — Z860100 Personal history of colon polyps, unspecified: Secondary | ICD-10-CM | POA: Insufficient documentation

## 2012-10-02 DIAGNOSIS — R4182 Altered mental status, unspecified: Secondary | ICD-10-CM | POA: Diagnosis not present

## 2012-10-02 DIAGNOSIS — F3289 Other specified depressive episodes: Secondary | ICD-10-CM | POA: Insufficient documentation

## 2012-10-02 DIAGNOSIS — IMO0001 Reserved for inherently not codable concepts without codable children: Secondary | ICD-10-CM | POA: Insufficient documentation

## 2012-10-02 DIAGNOSIS — K5730 Diverticulosis of large intestine without perforation or abscess without bleeding: Secondary | ICD-10-CM | POA: Diagnosis not present

## 2012-10-02 DIAGNOSIS — M899 Disorder of bone, unspecified: Secondary | ICD-10-CM | POA: Diagnosis not present

## 2012-10-02 DIAGNOSIS — N3090 Cystitis, unspecified without hematuria: Secondary | ICD-10-CM | POA: Diagnosis not present

## 2012-10-02 DIAGNOSIS — R002 Palpitations: Secondary | ICD-10-CM | POA: Diagnosis not present

## 2012-10-02 DIAGNOSIS — K219 Gastro-esophageal reflux disease without esophagitis: Secondary | ICD-10-CM | POA: Diagnosis not present

## 2012-10-02 DIAGNOSIS — G319 Degenerative disease of nervous system, unspecified: Secondary | ICD-10-CM | POA: Diagnosis not present

## 2012-10-02 DIAGNOSIS — R05 Cough: Secondary | ICD-10-CM | POA: Diagnosis not present

## 2012-10-02 DIAGNOSIS — R5383 Other fatigue: Secondary | ICD-10-CM | POA: Diagnosis not present

## 2012-10-02 DIAGNOSIS — E7800 Pure hypercholesterolemia, unspecified: Secondary | ICD-10-CM | POA: Diagnosis not present

## 2012-10-02 LAB — URINE MICROSCOPIC-ADD ON

## 2012-10-02 LAB — CBC WITH DIFFERENTIAL/PLATELET
Basophils Absolute: 0 10*3/uL (ref 0.0–0.1)
Basophils Relative: 0 % (ref 0–1)
Eosinophils Relative: 5 % (ref 0–5)
Hemoglobin: 13.9 g/dL (ref 12.0–15.0)
Lymphocytes Relative: 28 % (ref 12–46)
Lymphs Abs: 1.6 10*3/uL (ref 0.7–4.0)
MCH: 28.3 pg (ref 26.0–34.0)
Monocytes Absolute: 0.3 10*3/uL (ref 0.1–1.0)
Neutro Abs: 3.3 10*3/uL (ref 1.7–7.7)
Neutrophils Relative %: 60 % (ref 43–77)
Platelets: 211 10*3/uL (ref 150–400)
RBC: 4.91 MIL/uL (ref 3.87–5.11)
WBC: 5.5 10*3/uL (ref 4.0–10.5)

## 2012-10-02 LAB — RAPID URINE DRUG SCREEN, HOSP PERFORMED
Amphetamines: NOT DETECTED
Benzodiazepines: NOT DETECTED
Opiates: NOT DETECTED

## 2012-10-02 LAB — GLUCOSE, CAPILLARY: Glucose-Capillary: 98 mg/dL (ref 70–99)

## 2012-10-02 LAB — COMPREHENSIVE METABOLIC PANEL
AST: 22 U/L (ref 0–37)
Albumin: 3.7 g/dL (ref 3.5–5.2)
Calcium: 9.4 mg/dL (ref 8.4–10.5)
Creatinine, Ser: 0.83 mg/dL (ref 0.50–1.10)

## 2012-10-02 LAB — URINALYSIS, ROUTINE W REFLEX MICROSCOPIC
Bilirubin Urine: NEGATIVE
Nitrite: NEGATIVE
Specific Gravity, Urine: 1.006 (ref 1.005–1.030)
pH: 7 (ref 5.0–8.0)

## 2012-10-02 MED ORDER — SODIUM CHLORIDE 0.9 % IV BOLUS (SEPSIS)
1000.0000 mL | Freq: Once | INTRAVENOUS | Status: AC
  Administered 2012-10-02: 1000 mL via INTRAVENOUS

## 2012-10-02 NOTE — ED Notes (Signed)
Patient ambulated without difficulty. 

## 2012-10-02 NOTE — Telephone Encounter (Signed)
Notes Recorded by Raford Pitcher, RN on 09/29/2012 at 12:23 PM Lm with pt's husband to have her call office back. ------  Notes Recorded by Julio Sicks, NP on 09/28/2012 at 6:02 PM TSH is much better, no low nnl Could back down on synthroid dose to see if helps with jittery feeling  Decrease synthroid 1/2 daily -recheck TSH in 3 month ov  Change MAR  Please contact office for sooner follow up if symptoms do not improve or worsen or seek emergency care        Spouse states the pt is very weak and lethargic. She is drinking a lot of coconut water but she is not eating much at all. Pt also seems to be having some increased confusion as well. Spouse given results as stated by TP above and says the pt was still taking 1 whole tablet daily. He will have the pt start 1/2 tablet daily.  Pt says she does not feel well in general. SN, pls advise. Allergies  Allergen Reactions  . Mepergan (Meperidine-Promethazine)     Cardiac arrest  . Iohexol      Code: HIVES, Desc: pt states her face and throat swells when administered IV contrast - KB, Onset Date: [id number]   . Valsartan     REACTION: pt states INTOL to Diovan

## 2012-10-02 NOTE — Telephone Encounter (Signed)
I spoke with spouse and is aware of SN recs. He voiced his understanding., his will take pt to the ED for an evaluation for now. Nothing further was needed

## 2012-10-02 NOTE — Telephone Encounter (Signed)
The ED wants the pt evaluated either 10/03/12 or 10/04/12 in the office. Pt has been scheduled for OV with TP on Tues., 10/03/12 @ 9:45.

## 2012-10-02 NOTE — Telephone Encounter (Signed)
Per SN---its not her thyroid as we discussed--if she would like to be referred to endocrinology for further work up we can do this.  If she is having the confusion, weakness and lethargy---then ok to take her to the ER for eval.  thanks

## 2012-10-02 NOTE — ED Notes (Signed)
Patient transported to CT 

## 2012-10-02 NOTE — ED Provider Notes (Signed)
History     CSN: [id number]  Arrival date & time 10/02/12  1229   First MD Initiated Contact with Patient 10/02/12 1351      Chief Complaint  Patient presents with  . Altered Mental Status  . Weakness    (Consider location/radiation/quality/duration/timing/severity/associated sxs/prior treatment) HPI  [name] is a 67 y.o. female with past medical history significant for hypertension, and hyperlipidemia complaining of increasing confusion and short-term memory loss over the course of the last 3 weeks with a significant downturn 3 days. Patient is accompanied by her husband who also supplies the history of present illness. She gives an example of her confusion as she will set down an object and not remember where she put it. Patient was diagnosed with hypothyroidism proximally 4 months ago there have been questions as to whether she is taking her medication appropriately. Her husband feels that she is taking twice as much as normal. She denies any dysarthria, ataxia, unilateral weakness, fever, cough, chest pain, shortness of breath, abdominal pain, change in bowel or bladder habits. She does endorse a decreased by mouth intake and nausea. She states that she has recently been drinking a a lot of coconut water.  PCP: Oliver Pila  Past Medical History  Diagnosis Date  . Sleep apnea   . Allergic rhinitis   . HTN (hypertension)   . Chest pain   . Palpitations   . Hypercholesterolemia   . GERD (gastroesophageal reflux disease)   . Diverticulosis of colon   . History of colonic polyps   . Cystitis   . DJD (degenerative joint disease)   . Lumbar back pain   . Fibromyalgia   . Osteopenia 08/2011    DEXA t score -1.2 FRAX 13%/1.2%  . Vitamin D deficiency   . Anxiety   . Depression   . Psoriasis   . Unspecified disorder of plasma protein metabolism   . IC (interstitial cystitis)   . Thyroid disease     Past Surgical History  Procedure Date  . Laminotomy 2010    L-4,  L-5 FUSION  . Knee arthroscopy     RIGHT KNEE X 5  . Total knee arthroplasty 12-07 and 4-07    Dr. Shelle Iron, revision 12-09 Dr. Eileen Stanford at [facility]  . Patella fracture surgery 10-08    Dr. Charlann Boxer  . Posterior laminectomy / decompression lumbar spine 9-09    Dr. Jordan Likes  . Cholecystectomy   . Hand surgery     TRIGGER FINGER  . Hernia repair   . Laparoscopic oophorectomy 2010    BILATERAL  . Abdominal hysterectomy     for prolapse with abdominal incision  . Oophorectomy     BSO    Family History  Problem Relation Age of Onset  . Coronary artery disease    . Lupus    . Hypertension Mother   . Hypertension Father   . Breast cancer Sister   . Hypertension Sister   . Leukemia Brother   . Lung disease Brother   . Cancer Brother     Melanoma    History  Substance Use Topics  . Smoking status: Never Smoker   . Smokeless tobacco: Never Used  . Alcohol Use: No    OB History    Grav Para Term Preterm Abortions TAB SAB Ect Mult Living   0               Review of Systems  Constitutional: Negative for fever.  Respiratory: Negative for  shortness of breath.   Cardiovascular: Negative for chest pain.  Gastrointestinal: Negative for nausea, vomiting, abdominal pain and diarrhea.  Neurological: Negative for facial asymmetry and speech difficulty.  Psychiatric/Behavioral: Positive for confusion and decreased concentration.  All other systems reviewed and are negative.    Allergies  Mepergan; Iohexol; and Valsartan  Home Medications   Current Outpatient Rx  Name  Route  Sig  Dispense  Refill  . ALPRAZOLAM 0.5 MG PO TABS   Oral   Take 1 tablet (0.5 mg total) by mouth 2 (two) times daily.   60 tablet   2   . VITAMIN D 2000 UNITS PO CAPS   Oral   Take 2 capsules by mouth daily.          . OMEGA-3 FATTY ACIDS 1000 MG PO CAPS   Oral   Take 2 g by mouth daily.          [name] HYDROCORTISONE 2.5 % EX CREA   Topical   Apply 1 application topically as needed. RECTAL ITCHING           . IBUPROFEN 600 MG PO TABS   Oral   Take 1 tablet (600 mg total) by mouth every 6 (six) hours as needed for pain.   30 tablet   0   . LEVOTHYROXINE SODIUM 50 MCG PO TABS   Oral   Take 50 mcg by mouth daily.          . MELOXICAM 7.5 MG PO TABS   Oral   Take 15 mg by mouth daily. Take 2 tablets by mouth daily         . METOPROLOL TARTRATE 25 MG PO TABS   Oral   Take 12.5 mg by mouth every morning.          . MULTIVITAMIN/IRON PO   Oral   Take 1 tablet by mouth daily.          [name] NIACIN ER (ANTIHYPERLIPIDEMIC) 500 MG PO TBCR   Oral   Take 1 tablet (500 mg total) by mouth at bedtime.   90 tablet   3   . OMEPRAZOLE 20 MG PO CPDR   Oral   Take 20 mg by mouth every other day.           . OXYCODONE HCL 20 MG PO TABS   Oral   Take 1 tablet by mouth 4 (four) times daily as needed. PAIN.Marland KitchenMarland KitchenTake as directed per Dr. Vear Clock         . SIMVASTATIN 40 MG PO TABS   Oral   Take 20 mg by mouth at bedtime. Take 1/2 tablet by mouth at bedtime           BP 139/92  Pulse 73  Temp 98.6 F (37 C) (Oral)  Resp 20  SpO2 98%  Physical Exam  Nursing note and vitals reviewed. Constitutional: She is oriented to person, place, and time. She appears well-developed and well-nourished. No distress.  HENT:  Head: Normocephalic.       Dry mucous membranes  Eyes: Conjunctivae normal and EOM are normal. Pupils are equal, round, and reactive to light.  Neck: Normal range of motion. Neck supple. No thyromegaly present.  Cardiovascular: Normal rate and intact distal pulses.   Pulmonary/Chest: Effort normal and breath sounds normal. No stridor. No respiratory distress. She has no wheezes. She has no rales. She exhibits no tenderness.  Abdominal: Soft. Bowel sounds are normal. She exhibits no distension and no mass. There  is no tenderness. There is no rebound and no guarding.  Musculoskeletal: Normal range of motion. She exhibits no edema.  Neurological: She is alert and oriented  to person, place, and time.       Cranial nerves III through XII intact, strength 5 out of 5x4 extremities, negative pronator drift, finger to nose and heel-to-shin coordinated, sensation intact to pinprick and light touch, gait is coordinated and Romberg is negative.   Skin: Skin is warm.  Psychiatric: She has a normal mood and affect.    ED Course  Procedures (including critical care time)  Labs Reviewed - No data to display Dg Chest 1 View  10/02/2012  *RADIOLOGY REPORT*  Clinical Data: Altered mental status, cough, weakness  CHEST - 1 VIEW  Comparison: 09/28/2012; 08/02/2011; 07/23/2009; chest CT - 01/15/2010  Findings: Grossly unchanged cardiac silhouette and mediastinal contours.  No focal parenchymal opacity.  No pleural effusion or pneumothorax.  Unchanged bones.  IMPRESSION: No acute cardiopulmonary disease.   Original Report Authenticated By: Tacey Ruiz, MD    Ct Head Wo Contrast  10/02/2012  *RADIOLOGY REPORT*  Clinical Data: Altered mental status  CT HEAD WITHOUT CONTRAST  Technique:  Contiguous axial images were obtained from the base of the skull through the vertex without contrast.  Comparison: None.  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  A dilated perivascular space in the left peri insular cortex.  No mass effect, midline shift, or acute intracranial hemorrhage.  Mild global atrophy.  Mastoid air cells are clear.  Near complete opacification of the right maxillary sinus.  Mucosal thickening in the ethmoid air cells right greater than left.  Minimal mucosal thickening in the left sphenoid sinus.  IMPRESSION: No acute intracranial pathology.  Chronic changes are noted.   Original Report Authenticated By: Jolaine Click, M.D.      1. Dehydration   2. Memory change       MDM  Patient with nonfocal neurologic exam. She is alert and oriented x3. Blood work head CT and urinalysis  are normal. Discussed case with attending who agrees with care plan and stability for discharge to home. I have drawn and TSH and T4 as a courtesy to her primary care physician. Patient's been instructed to follow with her PCP in the next 24-48 hours.   Pt verbalized understanding and agrees with care plan. Outpatient follow-up and return precautions given.            Wynetta Emery, PA-C 10/03/12 [phone]

## 2012-10-02 NOTE — ED Notes (Signed)
Pt's husband reports pt has been having short-term memory loss and AMS x3 weeks but became severe x 3 days ago.  He reports that "sometimes she does not know who she is talking to."  He also reports decreased appetite and nausea.  He reports pt was recently dx with hypothyroidism.  Hx in the family reported as well.

## 2012-10-03 ENCOUNTER — Ambulatory Visit (INDEPENDENT_AMBULATORY_CARE_PROVIDER_SITE_OTHER): Payer: Medicare Other | Admitting: Adult Health

## 2012-10-03 ENCOUNTER — Encounter: Payer: Self-pay | Admitting: Adult Health

## 2012-10-03 VITALS — BP 106/72 | HR 57 | Temp 98.1°F | Ht 65.5 in | Wt 174.6 lb

## 2012-10-03 DIAGNOSIS — E039 Hypothyroidism, unspecified: Secondary | ICD-10-CM | POA: Diagnosis not present

## 2012-10-03 DIAGNOSIS — J329 Chronic sinusitis, unspecified: Secondary | ICD-10-CM | POA: Diagnosis not present

## 2012-10-03 DIAGNOSIS — F329 Major depressive disorder, single episode, unspecified: Secondary | ICD-10-CM

## 2012-10-03 DIAGNOSIS — R413 Other amnesia: Secondary | ICD-10-CM

## 2012-10-03 DIAGNOSIS — G4733 Obstructive sleep apnea (adult) (pediatric): Secondary | ICD-10-CM

## 2012-10-03 HISTORY — DX: Other amnesia: R41.3

## 2012-10-03 LAB — TSH: TSH: 0.517 u[IU]/mL (ref 0.350–4.500)

## 2012-10-03 MED ORDER — DULOXETINE HCL 30 MG PO CPEP: 30.0000 mg | ORAL_CAPSULE | Freq: Every day | ORAL | Status: DC

## 2012-10-03 NOTE — Patient Instructions (Signed)
Continue on Synthroid 50 mcg one half tablet daily. Decreased coconut water intake to no more than 3 cans daily Begin Cymbalta 30 mg daily We are referring you to neurology for evaluation of memory issues Follow up with Dr. Kriste Basque as planned in one month and as needed

## 2012-10-03 NOTE — Progress Notes (Signed)
Quick Note:  Pt aware per 1.13.14 phone note. ______

## 2012-10-03 NOTE — Progress Notes (Signed)
Subjective:    Patient ID: [name], female    DOB: Aug 11, 1946, 67 y.o.   MRN: [id number]  HPI 67 y/o WF hx mult med problems and is well known to [place]... also followed in the Lipid Clinic, and by numerous specialists as noted below...   ~  Jan 26, 2010:  Sleep eval [name] w/ 11/10 study showing AHI= 25 w/ obstructive & central events... CPAP titration was suboptimal & she was tried on BiPAP... she tells me that she stopped all this when she lost weight & husb confirms no snoring or sleep disordered breathing- rests well etc...  she saw [name] for f/u MGUS & rising IgM level> he felt she was stable compared to his old values & rec CT Scans to r/o lymphoma (done 4/11 & neg- no lesions seen, mild hepatomegaly noted)...  she also followed by [name] for Urology w/ IC> on Elmiron orally & in bladder (she does self caths now)...  also followed by [name] in the Pain Center now on referral from Ortho> tried on NUCYNTA ("too expensive")...  also notes improvement in FLP/ lipid clinic f/u on Simva40 & weight loss via diet etc...  ~  May 28, 2010:  states tick bite 8/18 "[name] says it was a deer tic" on upper inner left bicep area... given Doxy x7d per [name] & no systemic symptoms (no HA, fever, change in FM symptoms) but notes rash, itching, sweats, & pain in arm... on exam> no signif rash, sm red spot where she says tick was removed (no resid parts left), no erythema migrans etc... we discussed checking CBC & Lyme titers; Rx w/ Atarax for itching.  ~  July 29, 2010:  states she's doing "so-so"> pain management & [facility] (DrJinnah- Ortho) sent her to [facility]- [name] for another opinion about her knee- notes pending & she requiring Oxycodone 15mg  Tid for relief...  BP is well controlled on meds;  she denies CP & notes rare self-lim palpit, SOB at baseline & too sedentary due to ortho probs;  Lipids stable on Simva40;  GI stable on numerous meds;  & continues on Alpraz 3/d for  nerves...  ~  Jan 27, 2011:  69mo ROV & she is basically stable> still fatigued and now c/o itching, no rash, & we discussed Atarax for Prn use;  She feels she has improved on vegan diet along w/ her husb & [facility];  She is still seeing Pain Management & says "they say I have inflammation in my body";  She recently saw [name] for her 69mo f/u Heme/Onc testing...  ~  August 02, 2011:  69mo ROV & CPX per pt request> she is doing well on husb's vegan diet & wt down 12# further to 166# today w/ BMI=26; still c/o tired, fatigued- but better after IV Fe by [name];  BP improved on Metoprolol & she wants to decr to 1/d;  FLP followed in the Lipid Clinic on Simva, Niacin, Fish Oil;  States bowels are improved on carrot/spinach juiced by husb daily;  Still followed by pain management & taking Percocet 20mg  every 4H (ave 3-4 per day) she says;  See prob list below>>  ~  Feb 01, 2012:  69mo ROV & [name] continues to feel terrible overall- states she is in constant pain from her knees & legs; on Oxcodone per [name] pain clinic (states she is taking 3-6 tabs daily); Husb is inquiring about 2 clinics he found on the internet one in  [place] & one in [place] that do peripheral nerve surgery (I told him he was grasping & needed to discuss w/ her specialists if serious); she brought a BP log & pressures are ok but pulse is running low on her BBlocker so we agreed w/ decr Metoprolol 25mg - 1/2 Bid;  She is followed in the Lipid Clinic prev on Simva plus Niasp & Fish Oil, but husb wants her off Statins, they compromised & decr the Simva to 20mg /d & f/u labs pending...  We reviewed prob list, meds, xrays and labs>  See below>>    She saw DrFontaine, Gyn> routine check, atrophic changes, osteopenia- f/u BMD done 12/12 w/ lowest TScore -1.2 in right FemNeck...  ~  August 03, 2012:  88mo ROV & [name] continues to have a very difficult time w/ her FM, chronic pain syndrome, etc; she is going to contact  DrDeveshwar for a follow up rheum eval....    HxOSA, AR, Sinusitis> c/o recent sore throat/ URI treated w/ Keflex, MMW, Align & finally better...    HBP, CP, Palpit> on Metop25-1/2Qam; BP=140/82 today & 120s/60s at home; she denies recent CP, palpit, ch in SOB, edema, etc...    Chol> on Simva40-1/2daily & Niacin500; followed in the LC- last FLP 12/12 showed TChol 113, TG 87, HDL 36, LDL 60    Hypothyroid> on ZOXWRUEAV40; TP started med 10/13 when TSH=5.93; pt notes she's not feeling any better; we discussed recheck TSH on return...    GI> GERD, Divertics, Polyps> on Prilosec20, Phen25 prn, AnusolHC; she denies abd pain, notes some intermit nausea, no d/c/ blood seen...    GU> IC> prev treatment w/ DMSO; states voiding OK, denies pain etc...    DJD, LBP, FM, Chr Pain Syndrome> followed by [name] on Oxycodone 20Qid prn, Mobic 7.5/d; and DrIbazedo- seen 9/13 w/ shot in back (helped)...    Osteopenia> on Ca, MVI, VitD; last BMD 12/12 showed TScore +0.7 in Spine, and -1.2 in right FemNeck...    Anxiety> on Alprazolam 0.5mg Tid prn...    MGUS> followed by [name] for IgM monoclonal gammopathy- seen 10/13 & labs w/o signif change x Fe sl low (pt rec to incr Fe in diet she says)... We reviewed prob list, meds, xrays and labs> see below for updates >>  LABS 10/13:  Chems- ok w/ BS=115;  CBC- wnl;  Fe=36 (sat11%);  Ferritin=37; +Monoclonal IgM kappa paraprotein (no change in levels)...  09/28/2012 Acute OV  Complains of jittery/shakiness x29months, worse x3weeks - SOB, feels nervous .  Patient complains that over the last few months that she is still very jittery and nervous.  She believes this is all related to her beginning Synthroid. She wants her thyroid rechecked today. Patient was found to have a TSH of 5.92 and was started on Synthroid 50 mcg. Patient denies any hemoptysis, chest pain, palpitations, syncope, presyncopal episodes. She also, complains of some intermittent shortness, of breath on and  off this single and on for several months to year. Seems to be notes more noticeable since starting Synthroid. She takes Xanax most days, and occasionally takes it twice a day. Which does help with her anxiety >labs unremarkable w/ TSH at 2.42,  synthroid decreased to to see if helped symptoms.   10/03/2012 Post ER follow up  Patient returns for a post emergency room visit. Patient complained that she continued to have persistent symptoms of weakness, jitteriness, nervousness, but progressed to intermittent episodes of confusion and short-term memory loss. She ultimately went to the emergency  room on January 13, emergency room physicians, physical exam was unremarkable except for dry mucous membranes Chest x-ray showed no acute process, CT head showed no acute intracranial pathology with clear mastoid air cells, near complete opacification of the right maxillary sinus  ? Chronic in nature as has a hx of sinusitis prev eval with ENT at [facility] . Declines acute sinus sympotms.  Lab work revealed a normal electrolyte panel with a BUN of 5 and creatinine 0.83 normal LFTs, completely normal CBC with differential, normal free T4, and normal, TSH Husband reports unusual behavior with drinking over 50 cans (17oz/each) of coconut water in 1 weeks time , that accounted for >7 cans daily ( >120cc /24 hr ) We discussed that was too much water intake and decrease to around 3 cans daily .  Husband says that she is becoming more withdrawn, stopped going to church, [employer] , etc  Does not do any house work now. Lays on couch or bed all day. Progressive this past year, worse for last 3 months.  She admits to being unhappy. No suicidal ideations.  Resistant to antidepressants.  Does not sleeps , get up every couple of hours. Has hx of OSA/complex sleep apnea. Refused CPAP in past.  Has chronic pain , takes oxycodone 1-2 daily . Followed at pain clinic.  Husband reports that memory issues for last year but  more noticeable for last few weeks .  Pt denies memory issues.            Problem List:  UNSPECIFIED SLEEP APNEA (ICD-780.57) - Complex sleep apnea - PSG 07/25/09 AHI 28 > ~  Sleep eval [name] w/ 11/10 study showing AHI= 25 w/ obstructive & central events... CPAP titration was suboptimal & she was tried on BiPAP... she tells me that she stopped all this when she lost weight & husb confirms no snoring or sleep disordered breathing- rests well etc...   ALLERGIC RHINITIS (ICD-477.9)  Hx of SINUSITIS (ICD-473.9) - Hx sinusitis followed by [name] at [facility]...  HYPERTENSION (ICD-401.9) - on METOPROLOL 25mg  Qam now ... she was hosp 3/09 w/ gastroenteritis & BP meds were stopped due to weakness; grad restarted w/ improvement in BP & palpit... ~  11/11: BP= 122/70 she is INTOL to Diovan; denies HA, visual symptoms, CP, palpit, dizzy, syncope, edema; husb notes that BPs are similar at home ~120/70's... ~  5/12:  BP= 112/70 & she has the same chr fatigue complaints, but no specific symptoms... ~  11/12:  BP=130/80 & she has the same chronic complaints but denies CP, syncope, ch in SOB, edema; [facility]; she wants to decr the Metoprolol to 25mg  daily- ok... ~  CXR 11/12 showed clear lungs, normal heart size, mild DJD TSpine... ~  5/13:  BP= 142/80 & she persists w/ mult chronic complaints... ~  11/13:  BP= 140/82 & she is clinically stable...  CHEST PAIN (ICD-786.50) - seen by [name] in 2006... ~  2DEcho 11/98 showed norm valves, norm LVF, ? min LVH... ~  cath 7/03 w/ norm coronaries, & norm LVF... ~  NuclearStressTest 2/06 showed no infarct, no ischemia, EF=65%... ~  Myoview repeated 4/10: low risk study w/ soft tissue attenuation, normal wall motion, EF= 78%... ~  EKG 11/12 showed NSR, WNL.Marland KitchenMarland Kitchen  PALPITATIONS, HX OF (ICD-V12.50) - notes occas fluttering and "jello feeling" in her chest... Metoprolol helps.  HYPERCHOLESTEROLEMIA (ICD-272.0) - on ZOCOR 40mg Qhs + Fish Oil  + OTC Niacin>  followed in the LC. ~  FLP 01/03/08 on SIMVASTATIN 20mg /d showed TChol 206, TG 182, HDL 31, LDL 145... ~  FLP 6/09 (on Simva40) showed TChol 173, TG 199, HDL 31, LDL 102... continue f/u in lipid clinic. ~  FLP 11/09 (on Simva40) showed TChol 196, TG 265, HDL 32, LDL 119... she stopped lipid clinic. ~  Due to continued "side effects" she placed the Simvastatin on hold, then restarted 40mg /d... ~  FLP 6/10 showed TChol 218, TG 394, HDL 33, LDL 129...  ~  FLP 11/10 showed TChol 262, TG 568, HDL 31, LDL 144... she needs to ret to the Lipid Clinic. ~  FLP 1/11 after 20# wt loss showed TChol 144, TG 181, HDL 43, LDL 65 on Simva40 ~  FLP 10/11 on Simva40+FishOil showed TChol 145, TG 237, HDL 33, LDL 79 ~  FLP 2/12 on Simva40+FishOil showed TChol 126, TG 238, HDL 27, LDL 66 ~  FLP 6/12 in EPIC per LC showed TChol 160, TG 226, HDL 31, LDL 92... Continue meds, low fat diet, exercise. ~  FLP 12/12 by LC on Simva40+Niasp500 showed TChol 113, TG 87, HDL 36, LDL 60 ~  She continues regular f/u in Lipid Clinic...  GERD (ICD-530.81) - on PRILOSEC 20mg /d, and Phenergan as needed... ~  prev EGD 8/05 showed 3cmHH, gastric polyp... prev EGD w/ stricture dilated. ~  EGD repeated 12/09 & showed 5cm HH w/ healing ulcer, esophagitis, gastritis, etc... HPylori neg... REC> continue Prilosec, Phenergan. ~  she saw [name], [name] 8/10 for eval of nausea- no help from Reglan, Scopalamine, etc... had UGI/ Ba swallow- sm HH, mild reflux, norm peristalsis;  Gastric Emptying Scan- normal;  EGD 9/10 by [facility] w/ esoph ulcer seen, neg CLO, benign gastric polyp removed, Rx'd PPI. ~  5/12:  She states that she no longer has any GI issues on her vegan diet... ~  11/12:  She notes that Gabriel Rung gives her juiced carrots and spinach daily...  DIVERTICULOSIS OF COLON (ICD-562.10) - Rx w/ PROCTOSOL HC Cream Prn, and stool softeners... COLONIC POLYPS (ICD-211.3) - colonoscopy 8/07 by [name] showed divertics only...  CYSTITIS  (ICD-595.9) - she was seen at [facility] for poss interstitial cystitis... prev DMSO Rx. ~  Eval by [name] & pt tells me she is on Elmiron both orally & via bladder (self-cath). ~  9/11:  she reports IC is quiet & off Elmiron Rx now. ~  11/12:  She confirms min symptoms at present...  DEGENERATIVE JOINT DISEASE (ICD-715.90) - prev Rx MOBIC 7.5mg  Prn, now on OXYCODONE- taking daily for her pain per [name]... she had a right TKR 12/07 by [name]... then required right Patella surg 10/08 from [name]... now s/p right TKR revision by DrJinna at [facility]- they referred to pain management... ~  9/11: she tells me that [name] is referring her to [facility] for another opinion regarding her knees ([name]- there was nothing they could do & rec Pain Management, [facility], & veggie diet). ~  11/12:  She reports left knee bone-on-bone w/ shot from [facility] 10/12 which helped; still followed by Pain Management & she tells me she's taking Percocet 10mg  every 4H (ave 3-4 per day). ~  11/13:  She continues to follow up w/ [name] pain clinic 7 reports taking Oxycod 20mg  3-4 tabs daily, plus Mobic7.5.Marland KitchenMarland Kitchen  BACK PAIN, LUMBAR (ICD-724.2) - she had a right L4-5 decompressive laminotomy w/ foraminotomy 12/02 by DrPool... then L4-5 decompression & fusion 9/09 by DrPool due to DDD w/ synovial cyst & degen spondylolithesis  w/ stenosis... f/u XRays and MRI 5/10 showed fusion intact, NAD.[name]. ~  CHRONIC PAIN MANAGEMENT> followed by [name] clinic now, on OXYCODONE 10-20mg  every 4H prn (ave 3-4/d she says), plus MOBIC 7.5mg /d. ~  9/13:  She reports that she had ESI from DrIbazedo, w/ some improvement...  FIBROMYALGIA (ICD-729.1) - she continues to have mult somatic complaints... she has seen DrDeveshwar in the past...  ~  She reports trial of Accupuncture> it helped the FM but not the knee pain. ~  husb has inquired about periph nerve surg he researched on the internet, he is referred to her specialists to  discuss options... ~  11/13:  They indicate that they have a f/u appt w/ DrDeveshwar for Rheum...  OSTEOPENIA (ICD-733.90) - on calcium, MVI, Vit D... VITAMIN D DEFICIENCY (ICD-268.9) -  ~  Vit D level was 5 (30-90) 11/08 & rec to start VitD 50K/wk... ~  Vit D level 11/09 = 45  ~  Vit D level 6/10 = 23 ~  Vit D level 11/10 = 35 ~  Vit D level 5/11 = 70... switched to Vit D 2000u daily. ~  BMD 12/12 by DrFontaine showed lowest measured site= right FemNeck -1.2  ANXIETY (ICD-300.00) - on ALPRAZOLAM 0.5mg  as needed- taking 3 per day recently (she notes mild tremor & this helps). DEPRESSION (ICD-311)  Hx of PSORIASIS (ICD-696.1)   Hx of UNSPECIFIED DISORDER PLASMA PROTEIN METABOLISM (ICD-273.9) - she has a biclonal gammopathy evaluated 10/05 by [name] who thought it was reactive, perhaps a MGUS variant... ~  f/u labs 11/09 showed Hg= 12.2, TProt= 7.1, SPE/ IEP w/ biclonal IgM kappa protein present accounting for 0.31g/dl of the total 1.30 g/dl of protein in the gamma region & IgM level = 319 mg/dl... ~  labs 11/10 showed Hg= 12.5, TProt= 7.8, SPEP/IEP w/ monoclonal IgM kappa & IgM lamba proteins accounting for 0.44 g/dl of the total 8.65 g/dl in the gamma region... IgM level = 490mg /dl... ~  11/10:  Referred back to [name] who has resumed follow up of this problem... ~  4/11:  [name] did CT Chest/ Abd/ Pelvis> neg w/o adenopathy etc... ~  2012> she was given IV iron by [name] & reports feeling some better... ~  11/12:  Labs here showed Hg= 13.7, Fe= 61 (18%sat) ~  Labs from [employer] [employer] (once yearly in the fall) reviewed in EPIC flow sheet ==> IgM monoclonal gammopathy w/ stable parameters.   Past Surgical History  Procedure Date  . Laminotomy 2010    L-4, L-5 FUSION  . Knee arthroscopy     RIGHT KNEE X 5  . Total knee arthroplasty 12-07 and 4-07    Dr. Shelle Iron, revision 12-09 Dr. Eileen Stanford at [facility]  . Patella fracture surgery 10-08    Dr. Charlann Boxer  . Posterior laminectomy /  decompression lumbar spine 9-09    Dr. Jordan Likes  . Cholecystectomy   . Hand surgery     TRIGGER FINGER  . Hernia repair   . Laparoscopic oophorectomy 2010    BILATERAL  . Abdominal hysterectomy     for prolapse with abdominal incision  . Oophorectomy     BSO    Outpatient Encounter Prescriptions as of 10/03/2012  Medication Sig Dispense Refill  . ALPRAZolam (XANAX) 0.5 MG tablet Take 1 tablet (0.5 mg total) by mouth 2 (two) times daily.  60 tablet  2  . Cholecalciferol (VITAMIN D) 2000 UNITS CAPS Take 2 capsules by mouth daily.       . fish oil-omega-3  fatty acids 1000 MG capsule Take 2 g by mouth daily.       . hydrocortisone 2.5 % cream Apply 1 application topically as needed. RECTAL ITCHING      . ibuprofen (ADVIL,MOTRIN) 600 MG tablet Take 1 tablet (600 mg total) by mouth every 6 (six) hours as needed for pain.  30 tablet  0  . levothyroxine (SYNTHROID, LEVOTHROID) 50 MCG tablet Take 50 mcg by mouth daily.       . meloxicam (MOBIC) 7.5 MG tablet Take 15 mg by mouth daily. Take 2 tablets by mouth daily      . metoprolol tartrate (LOPRESSOR) 25 MG tablet Take 12.5 mg by mouth every morning.       . Multiple Vitamins-Iron (MULTIVITAMIN/IRON PO) Take 1 tablet by mouth daily.       . niacin (NIASPAN) 500 MG CR tablet Take 1 tablet (500 mg total) by mouth at bedtime.  90 tablet  3  . omeprazole (PRILOSEC) 20 MG capsule Take 20 mg by mouth every other day.        . Oxycodone HCl 20 MG TABS Take 1 tablet by mouth 4 (four) times daily as needed. PAIN.Marland KitchenMarland KitchenTake as directed per Dr. Vear Clock      . simvastatin (ZOCOR) 40 MG tablet Take 20 mg by mouth at bedtime. Take 1/2 tablet by mouth at bedtime         Allergies  Allergen Reactions  . Mepergan (Meperidine-Promethazine)     Cardiac arrest  . Iohexol      Code: HIVES, Desc: pt states her face and throat swells when administered IV contrast - KB, Onset Date: [id number]   . Valsartan     REACTION: pt states INTOL to Diovan    Current  Medications, Allergies, Past Medical History, Past Surgical History, Family History, and Social History were reviewed in [employer] record.    Review of Systems         Constitutional:   No  weight loss, night sweats,  Fevers, chills,  +fatigue, or  lassitude.  HEENT:   No headaches,  Difficulty swallowing,  Tooth/dental problems, or  Sore throat,                No sneezing, itching, ear ache, nasal congestion, post nasal drip,   CV:  No chest pain,  Orthopnea, PND, swelling in lower extremities, anasarca, dizziness, palpitations, syncope.   GI  No heartburn, indigestion, abdominal pain, nausea, vomiting, diarrhea, change in bowel habits, loss of appetite, bloody stools.   Resp:   No excess mucus, no productive cough,  No non-productive cough,  No coughing up of blood.  No change in color of mucus.  No wheezing.  No chest wall deformity  Skin: no rash or lesions.  GU: no dysuria, change in color of urine, no urgency or frequency.  No flank pain, no hematuria   MS:  No joint swelling.   Psych:     +memory loss        Objective:   Physical Exam     WD, WN, 67 y/o WF in NAD... GENERAL:  Alert & oriented; tearful , appears depressed  HEENT:  [place]/AT,  EACs-clear, TMs-wnl, NOSE-clear, THROAT-clear & wnl. NECK:  Supple w/ fairROM; no JVD; normal carotid impulses w/o bruits; no thyromegaly or nodules palpated; no lymphadenopathy. CHEST:  Clear to P & A; without wheezes/ rales/ or rhonchi. HEART:  Regular Rhythm; without murmurs/ rubs/ or gallops. ABDOMEN:  Soft & nontender; normal  bowel sounds; no organomegaly or masses detected. EXT: s/p right TKR, mod arthritic changes; no varicose veins/ +venous insuffic/ tr edema.  NEURO:  no focal neuro deficits...noted on exam.    Assessment & Plan:

## 2012-10-03 NOTE — Addendum Note (Signed)
Addended by: Boone Master E on: 10/03/2012 12:41 PM   Modules accepted: Orders

## 2012-10-03 NOTE — Assessment & Plan Note (Signed)
TSH is w/n nml range w/ nml T4  Will proceed with lower dose to see if helps with pt somatic complaints  However if continues to complain will need referral to endocrinology  This was explained in detail with pt and husband w/ lab review.

## 2012-10-03 NOTE — Progress Notes (Signed)
Quick Note:  Patient returned call per 1.10.14 phone note - is aware of results / recs as stated by TP. ______

## 2012-10-03 NOTE — Assessment & Plan Note (Signed)
CT head w/ ? Chronic changes in sinus  No acute symptoms  Will cont to follow , no abx at this time.

## 2012-10-03 NOTE — Assessment & Plan Note (Signed)
?   Memory impairment per husband  ? Underlying depression is contributing  CT head 10/02/12 w / no acute process. Polydipsia ? Etiology -labs are all unrevealing w/ nml NA+, Glucose , kidney fxn. CT head w/ no evidence of pituitary/hypopit issues If persists may need MRI to look for other causes.   Will refer to neuro for evaluation

## 2012-10-03 NOTE — Assessment & Plan Note (Signed)
Hx of OSA , poor sleep hygiene  If persists may need to refer to SLeep specialist.

## 2012-10-03 NOTE — Assessment & Plan Note (Signed)
Suspect underlying depression as labs have been unrevealing  Discussed beginning Cymbalta 30mg  as she has chronic pain and FM underlying  Stress reducers.  On return consider referral to pschy. For counseling

## 2012-10-03 NOTE — ED Provider Notes (Signed)
Medical screening examination/treatment/procedure(s) were performed by non-physician practitioner and as supervising physician I was immediately available for consultation/collaboration.  Jewelle Whitner, MD 10/03/12 [id number] 

## 2012-10-09 ENCOUNTER — Telehealth: Payer: Self-pay | Admitting: Pulmonary Disease

## 2012-10-09 NOTE — Telephone Encounter (Signed)
Per comment in neuro referral:  Note  Referral faxed to [facility] requesting appointment. Waiting on appointment. Rhonda J Cobb  ----  Called, spoke with pt.  Calling to check on the status of neuro appointment.  Advised I would send msg to [facility] to follow up on.  She verbalized understanding.  [facility], can you pls check on the status of this. Thank you.

## 2012-10-10 NOTE — Telephone Encounter (Signed)
[facility] faxed over appointment information today that patient would be seeing Dr. Frances Furbish on 10/17/12 at 1:30. According to the faxed appointment information from [facility], they had contacted patient and she was aware of this appointment.  Called and spoke with patient and she stated that [facility] called her this morning and she is aware of the appointment on 10/17/12 and to arrive at 1:00. Gave patient the location - [address] Pt is aware. Rhonda J Cobb

## 2012-10-17 DIAGNOSIS — R413 Other amnesia: Secondary | ICD-10-CM | POA: Diagnosis not present

## 2012-10-17 DIAGNOSIS — R4182 Altered mental status, unspecified: Secondary | ICD-10-CM | POA: Diagnosis not present

## 2012-10-18 ENCOUNTER — Telehealth: Payer: Self-pay | Admitting: Adult Health

## 2012-10-18 NOTE — Telephone Encounter (Signed)
Forward  5 pages from [facility] to Dr. Rubye Oaks for review on 10-18-12 ym

## 2012-10-27 DIAGNOSIS — G8928 Other chronic postprocedural pain: Secondary | ICD-10-CM | POA: Diagnosis not present

## 2012-10-27 DIAGNOSIS — M25569 Pain in unspecified knee: Secondary | ICD-10-CM | POA: Diagnosis not present

## 2012-10-27 DIAGNOSIS — M25559 Pain in unspecified hip: Secondary | ICD-10-CM | POA: Diagnosis not present

## 2012-10-27 DIAGNOSIS — M19049 Primary osteoarthritis, unspecified hand: Secondary | ICD-10-CM | POA: Diagnosis not present

## 2012-11-09 ENCOUNTER — Encounter: Payer: Self-pay | Admitting: Pulmonary Disease

## 2012-11-09 ENCOUNTER — Ambulatory Visit (INDEPENDENT_AMBULATORY_CARE_PROVIDER_SITE_OTHER): Payer: Medicare Other | Admitting: Pulmonary Disease

## 2012-11-09 VITALS — BP 142/82 | HR 58 | Temp 97.7°F | Ht 65.5 in | Wt 170.2 lb

## 2012-11-09 DIAGNOSIS — I10 Essential (primary) hypertension: Secondary | ICD-10-CM | POA: Diagnosis not present

## 2012-11-09 DIAGNOSIS — E039 Hypothyroidism, unspecified: Secondary | ICD-10-CM

## 2012-11-09 DIAGNOSIS — M545 Low back pain, unspecified: Secondary | ICD-10-CM

## 2012-11-09 DIAGNOSIS — M25561 Pain in right knee: Secondary | ICD-10-CM

## 2012-11-09 DIAGNOSIS — F3289 Other specified depressive episodes: Secondary | ICD-10-CM

## 2012-11-09 DIAGNOSIS — M25569 Pain in unspecified knee: Secondary | ICD-10-CM

## 2012-11-09 DIAGNOSIS — E7800 Pure hypercholesterolemia, unspecified: Secondary | ICD-10-CM

## 2012-11-09 DIAGNOSIS — G894 Chronic pain syndrome: Secondary | ICD-10-CM

## 2012-11-09 DIAGNOSIS — IMO0001 Reserved for inherently not codable concepts without codable children: Secondary | ICD-10-CM

## 2012-11-09 DIAGNOSIS — R5381 Other malaise: Secondary | ICD-10-CM

## 2012-11-09 DIAGNOSIS — M1990 Unspecified osteoarthritis, unspecified site: Secondary | ICD-10-CM

## 2012-11-09 DIAGNOSIS — F411 Generalized anxiety disorder: Secondary | ICD-10-CM

## 2012-11-09 DIAGNOSIS — R079 Chest pain, unspecified: Secondary | ICD-10-CM

## 2012-11-09 DIAGNOSIS — R413 Other amnesia: Secondary | ICD-10-CM

## 2012-11-09 NOTE — Patient Instructions (Addendum)
Today we updated your med list in our EPIC system...    Continue your current medications the same...  We discussed:    1) giving the Celexa (per [name]) a trial period 4-6weeks to see if it helps...    2) increase your physical [facility]: [facility], walk at home, get off the couch, etc...  Plan to give [place] a call in mid March & let us know your progress & how you are feeling...    Next step is to refer you to a good counselor for further eval & help w/ the depression...  Let's plan a routine follow up here in 3-4 months.Marland KitchenMarland Kitchen

## 2012-11-17 ENCOUNTER — Telehealth: Payer: Self-pay | Admitting: Pulmonary Disease

## 2012-11-17 NOTE — Telephone Encounter (Signed)
Rec'd 2 pages from [facility] forward to Dr. Kriste Basque 10/2812

## 2012-11-21 ENCOUNTER — Encounter: Payer: Self-pay | Admitting: Pulmonary Disease

## 2012-11-21 NOTE — Progress Notes (Signed)
Subjective:    Patient ID: [name], female    DOB: December 31, 1945, 67 y.o.   MRN: [id number]  HPI 67 y/o WF here for a follow up visit... she has mult med problems and is well known to [place]... also followed in the Lipid Clinic, and by numerous specialists as noted below...   ~  August 02, 2011:  341mo ROV & CPX per pt request> she is doing well on husb's vegan diet & wt down 12# further to 166# today w/ BMI=26; still c/o tired, fatigued- but better after IV Fe by [name];  BP improved on Metoprolol & she wants to decr to 1/d;  FLP followed in the Lipid Clinic on Simva, Niacin, Fish Oil;  States bowels are improved on carrot/spinach juiced by husb daily;  Still followed by pain management & taking Percocet 20mg  every 4H (ave 3-4 per day) she says;  See prob list below>>  ~  Feb 01, 2012:  341mo ROV & Kensie continues to feel terrible overall- states she is in constant pain from her knees & legs; on Oxcodone per [name] pain clinic (states she is taking 3-6 tabs daily); Husb is inquiring about 2 clinics he found on the internet one in [place] & one in [place] that do peripheral nerve surgery (I told him he was grasping & needed to discuss w/ her specialists if serious); she brought a BP log & pressures are ok but pulse is running low on her BBlocker so we agreed w/ decr Metoprolol 25mg - 1/2 Bid;  She is followed in the Lipid Clinic prev on Simva plus Niasp & Fish Oil, but husb wants her off Statins, they compromised & decr the Simva to 20mg /d & f/u labs pending...  We reviewed prob list, meds, xrays and labs>  See below>>    She saw DrFontaine, Gyn> routine check, atrophic changes, osteopenia- f/u BMD done 12/12 w/ lowest TScore -1.2 in right FemNeck...  ~  August 03, 2012:  341mo ROV & Elasia continues to have a very difficult time w/ her FM, chronic pain syndrome, etc; she is going to contact DrDeveshwar for a follow up rheum eval....    HxOSA, AR, Sinusitis> c/o recent sore throat/ URI treated  w/ Keflex, MMW, Align & finally better...    HBP, CP, Palpit> on Metop25-1/2Qam; BP=140/82 today & 120s/60s at home; she denies recent CP, palpit, ch in SOB, edema, etc...    Chol> on Simva40-1/2daily & Niacin500; followed in the LC- last FLP 12/12 showed TChol 113, TG 87, HDL 36, LDL 60    Hypothyroid> on [id number]; TP started med 10/13 when TSH=5.93; pt notes she's not feeling any better; we discussed recheck TSH on return...    GI> GERD, Divertics, Polyps> on Prilosec20, Phen25 prn, AnusolHC; she denies abd pain, notes some intermit nausea, no d/c/ blood seen...    GU> IC> prev treatment w/ DMSO; states voiding OK, denies pain etc...    DJD, LBP, FM, Chr Pain Syndrome> followed by [name] on Oxycodone 20Qid prn, Mobic 7.5/d; and DrIbazedo- seen 9/13 w/ shot in back (helped)...    Osteopenia> on Ca, MVI, VitD; last BMD 12/12 showed TScore +0.7 in Spine, and -1.2 in right FemNeck...    Anxiety> on Alprazolam 0.5mg Tid prn...    MGUS> followed by [name] for IgM monoclonal gammopathy- seen 10/13 & labs w/o signif change x Fe sl low (pt rec to incr Fe in diet she says)... We reviewed prob list, meds, xrays and labs> see below for updates >>  she had the 2013 Flu vaccine 11/15... LABS 10/13:  Chems- ok w/ BS=115;  CBC- wnl;  Fe=36 (sat11%);  Ferritin=37; +Monoclonal IgM kappa paraprotein (no change in levels)...  ~  November 09, 2012:  54mo ROV & add-on s/p mult visits- 2 w/ TP, 2 ER visits, Ortho, Rheum, Neuro, & Pain management...     She had f/u eval by DrFontaine, GYN 12/13> c/o disch, neg wet prep, exam w/ atrophic changes, s/p hyst, mammogram 8/13 was neg, BMD 8/12 w/ TScore -1.2 & f/u in 2014 planned...    She had f/o Ortho, DrBassett & Rheum, DrDeveshwar 12/13> active FM w/ pain & pos trigger points, chr fatigue, severe pain, continued right knee pain despite TKR, LBP, SI joint pain, etc; they checked lab panel & [facility]...     She saw TP 1/14 c/o  jittery & nervous- she believed due to starting low dose Synthroid for TSH=5.92 in Oct2013; f/u TSH returned 2.42 on the med- continue same...    She went to the ER w/ confusion & memory loss> ER eval was neg- Exam, CXR, CTBrain, Labs, etc; husb reported that she drank 50 cans of coconut water over 1week- & told to decrease this; followed up in the office w/ TP & felt to be depressed- Rec trial Cymbalta30 & referred to Neuro for further eval...    Neuro told her that her memory was OK (note pending), & they changed Cymbalta to Celexa20 ("I'm allergic to the Cymbalta")    She is very jittery & tearful> says she doesn't want to leave the house, not exercising, etc; I wonder about Psyche- agoraphobia vs PTSD & suggest referral for eval &/or counseling (she will think about it)... We reviewed prob list, meds, xrays and labs> see below for updates >>  LABS 1/14:  Chems- wnl x BS=112;  CBC- wnl;  TSH=0.52 & FreeT4=1.18;  UA- clear           Problem List:  UNSPECIFIED SLEEP APNEA (ICD-780.57) - Complex sleep apnea - PSG 07/25/09 AHI 28 > ~  Sleep eval [name] w/ 11/10 study showing AHI= 25 w/ obstructive & central events... CPAP titration was suboptimal & she was tried on BiPAP... she tells me that she stopped all this when she lost weight & husb confirms no snoring or sleep disordered breathing- rests well etc... Denies daytime sleepiness or issues... ALLERGIC RHINITIS (ICD-477.9) Hx of SINUSITIS (ICD-473.9) - Hx sinusitis [name] [facility]...  HYPERTENSION (ICD-401.9) - on METOPROLOL 25mg  Qam now ... she was hosp 3/09 w/ gastroenteritis & BP meds were stopped due to weakness; grad restarted w/ improvement in BP & palpit... ~  11/11: BP= 122/70 she is INTOL to Diovan; denies HA, visual symptoms, CP, palpit, dizzy, syncope, edema; husb notes that BPs are similar at home ~120/70's... ~  5/12:  BP= 112/70 & she has the same chr fatigue complaints, but no specific symptoms... ~  11/12:  BP=130/80 &  she has the same chronic complaints but denies CP, syncope, ch in SOB, edema; [facility]; she wants to decr the Metoprolol to 25mg  daily- ok... ~  CXR 11/12 showed clear lungs, normal heart size, mild DJD TSpine... ~  5/13:  BP= 142/80 & she persists w/ mult chronic complaints... ~  11/13:  BP= 140/82 & she is clinically stable... ~  2/14:  BP= 142/82 & she appears unchanged...  CHEST PAIN (ICD-786.50) - seen by [name] in 2006... ~  2DEcho 11/98 showed norm valves, norm LVF, ? min LVH... ~  cath 7/03 w/ norm coronaries, & norm LVF... ~  NuclearStressTest 2/06 showed no infarct, no ischemia, EF=65%... ~  Myoview repeated 4/10: low risk study w/ soft tissue attenuation, normal wall motion, EF= 78%... ~  EKG 11/12 showed NSR, WNL.[name]. ~  CXR 1/14 via ER showed normal heart size, clear, NAD... ~  EKG 1/14 showed NSR, rate66, borderline IVCD, otherw wnl, NAD...  PALPITATIONS, HX OF (ICD-V12.50) - notes occas fluttering and "jello feeling" in her chest... Metoprolol helps.  HYPERCHOLESTEROLEMIA (ICD-272.0) - on ZOCOR 40mg Qhs + Fish Oil  + OTC Niacin> followed in the LC. ~  FLP 01/03/08 on SIMVASTATIN 20mg /d showed TChol 206, TG 182, HDL 31, LDL 145... ~  FLP 6/09 (on Simva40) showed TChol 173, TG 199, HDL 31, LDL 102... continue f/u in lipid clinic. ~  FLP 11/09 (on Simva40) showed TChol 196, TG 265, HDL 32, LDL 119... she stopped lipid clinic. ~  Due to continued "side effects" she placed the Simvastatin on hold, then restarted 40mg /d... ~  FLP 6/10 showed TChol 218, TG 394, HDL 33, LDL 129...  ~  FLP 11/10 showed TChol 262, TG 568, HDL 31, LDL 144... she needs to ret to the Lipid Clinic. ~  FLP 1/11 after 20# wt loss showed TChol 144, TG 181, HDL 43, LDL 65 on Simva40 ~  FLP 10/11 on Simva40+FishOil showed TChol 145, TG 237, HDL 33, LDL 79 ~  FLP 2/12 on Simva40+FishOil showed TChol 126, TG 238, HDL 27, LDL 66 ~  FLP 6/12 in EPIC per LC showed TChol 160, TG 226, HDL 31, LDL  92... Continue meds, low fat diet, exercise. ~  FLP 12/12 by LC on Simva40+Niasp500 showed TChol 113, TG 87, HDL 36, LDL 60 ~  She continues regular f/u in Lipid Clinic...  GERD (ICD-530.81) - on PRILOSEC 20mg /d, and Phenergan as needed... ~  prev EGD 8/05 showed 3cmHH, gastric polyp... prev EGD w/ stricture dilated. ~  EGD repeated 12/09 & showed 5cm HH w/ healing ulcer, esophagitis, gastritis, etc... HPylori neg... REC> continue Prilosec, Phenergan. ~  she saw [name], [name] 8/10 for eval of nausea- no help from Reglan, Scopalamine, etc... had UGI/ Ba swallow- sm HH, mild reflux, norm peristalsis;  Gastric Emptying Scan- normal;  EGD 9/10 by [facility] w/ esoph ulcer seen, neg CLO, benign gastric polyp removed, Rx'd PPI. ~  5/12:  She states that she no longer has any GI issues on her vegan diet... ~  11/12:  She notes that Gabriel Rung gives her juiced carrots and spinach daily...  DIVERTICULOSIS OF COLON (ICD-562.10) - Rx w/ PROCTOSOL HC Cream Prn, and stool softeners... COLONIC POLYPS (ICD-211.3) - colonoscopy 8/07 by [name] showed divertics only...  CYSTITIS (ICD-595.9) - she was seen at [facility] for poss interstitial cystitis... prev DMSO Rx. ~  Eval by [name] & pt tells me she is on Elmiron both orally & via bladder (self-cath). ~  9/11:  she reports IC is quiet & off Elmiron Rx now. ~  11/12:  She confirms min symptoms at present...  DEGENERATIVE JOINT DISEASE (ICD-715.90) - prev Rx MOBIC 7.5mg  Prn, now on OXYCODONE- taking daily for her pain per [name]... she had a right TKR 12/07 by [name]... then required right Patella surg 10/08 from [name]... now s/p right TKR revision by DrJinna at [facility]- they referred to pain management... ~  9/11: she tells me that [name] is referring her to [facility] for another  opinion regarding her knees ([name]- there was nothing they could do & rec [facility], & veggie diet). ~  11/12:  She reports left knee  bone-on-bone w/ shot from [facility] 10/12 which helped; still followed by Pain Management & she tells me she's taking Percocet 10mg  every 4H (ave 3-4 per day). ~  11/13:  She continues to follow up w/ [name] pain clinic & reports taking Oxycod 20mg  3-4 tabs daily, plus Mobic7.5.Marland KitchenMarland Kitchen ~  12/13: she had f/u Ortho DrBassett at [facility] for leg swelling- neg VenDopplers w/o DVT & rec to avoid sodium, elev legs, wear support hose; given HC cream for dermatitis...  BACK PAIN, LUMBAR (ICD-724.2) - she had a right L4-5 decompressive laminotomy w/ foraminotomy 12/02 by DrPool... then L4-5 decompression & fusion 9/09 by DrPool due to DDD w/ synovial cyst & degen spondylolithesis w/ stenosis... f/u XRays and MRI 5/10 showed fusion intact, NAD.[name]. ~  CHRONIC PAIN MANAGEMENT> followed by [name] clinic now, on OXYCODONE 10-20mg  every 4H prn (ave 3-4/d she says), plus MOBIC 7.5mg /d. ~  9/13:  She reports that she had ESI from DrIbazedo, w/ some improvement...  FIBROMYALGIA (ICD-729.1) - she continues to have mult somatic complaints... she has seen DrDeveshwar in the past...  ~  She reports trial of Accupuncture> it helped the FM but not the knee pain. ~  husb has inquired about periph nerve surg he researched on the internet, he is referred to her specialists to discuss options... ~  11/13:  They indicate that they have a f/u appt w/ DrDeveshwar for Rheum... ~  12/13:  She had Rheum eval by DrDeveshwar> active FM w/ pain & pos trigger points, chr fatigue, severe pain, continued right knee pain despite TKR, LBP, SI joint pain, etc; they checked lab panel & rec [facility].  OSTEOPENIA (ICD-733.90) - on calcium, MVI, Vit D... Followed by Gyn- DrFontaine. VITAMIN D DEFICIENCY (ICD-268.9) -  ~  Vit D level was 5 (30-90) 11/08 & rec to start VitD 50K/wk... ~  Vit D level 11/09 = 45  ~  Vit D level 6/10 = 23 ~  Vit D level 11/10 = 35 ~  Vit D level 5/11 = 70... switched to Vit  D 2000u daily. ~  BMD 12/12 by DrFontaine showed lowest measured site= right FemNeck -1.2  ANXIETY (ICD-300.00) - on ALPRAZOLAM 0.5mg  as needed- taking 3 per day recently (she notes mild tremor & this helps). DEPRESSION (ICD-311) ~  CT Brain 1/14 via ER showed a sm pineal lipoma, mild global atrophy, right max sinus opacification & mucosal thickening, NAD...  Hx of PSORIASIS (ICD-696.1)   Hx of UNSPECIFIED DISORDER PLASMA PROTEIN METABOLISM (ICD-273.9) - she has a biclonal gammopathy evaluated 10/05 by [name] who thought it was reactive, perhaps a MGUS variant... ~  f/u labs 11/09 showed Hg= 12.2, TProt= 7.1, SPE/ IEP w/ biclonal IgM kappa protein present accounting for 0.31g/dl of the total 1.61 g/dl of protein in the gamma region & IgM level = 319 mg/dl... ~  labs 11/10 showed Hg= 12.5, TProt= 7.8, SPEP/IEP w/ monoclonal IgM kappa & IgM lamba proteins accounting for 0.44 g/dl of the total 0.96 g/dl in the gamma region... IgM level = 490mg /dl... ~  11/10:  Referred back to [name] who has resumed follow up of this problem... ~  4/11:  [name] did CT Chest/ Abd/ Pelvis> neg w/o adenopathy etc... ~  2012> she was given IV iron by [name] & reports  feeling some better... ~  11/12:  Labs here showed Hg= 13.7, Fe= 61 (18%sat) ~  Labs from [employer] (once yearly in the fall) reviewed in EPIC flow sheet ==> IgM monoclonal gammopathy w/ stable parameters.   Past Surgical History  Procedure Laterality Date  . Laminotomy  2010    L-4, L-5 FUSION  . Knee arthroscopy      RIGHT KNEE X 5  . Total knee arthroplasty  12-07 and 4-07    Dr. Shelle Iron, revision 12-09 Dr. Eileen Stanford at [facility]  . Patella fracture surgery  10-08    Dr. Charlann Boxer  . Posterior laminectomy / decompression lumbar spine  9-09    Dr. Jordan Likes  . Cholecystectomy    . Hand surgery      TRIGGER FINGER  . Hernia repair    . Laparoscopic oophorectomy  2010    BILATERAL  . Abdominal hysterectomy      for prolapse with abdominal incision   . Oophorectomy      BSO    Outpatient Encounter Prescriptions as of 11/09/2012  Medication Sig Dispense Refill  . ALPRAZolam (XANAX) 0.5 MG tablet Take 1 tablet (0.5 mg total) by mouth 2 (two) times daily.  60 tablet  2  . Cholecalciferol (VITAMIN D) 2000 UNITS CAPS Take 2 capsules by mouth daily.       . citalopram (CELEXA) 20 MG tablet Take 20 mg by mouth daily.      . fish oil-omega-3 fatty acids 1000 MG capsule Take 2 g by mouth daily.       . hydrocortisone 2.5 % cream Apply 1 application topically as needed. RECTAL ITCHING      . ibuprofen (ADVIL,MOTRIN) 600 MG tablet Take 1 tablet (600 mg total) by mouth every 6 (six) hours as needed for pain.  30 tablet  0  . levothyroxine (SYNTHROID, LEVOTHROID) 50 MCG tablet 1/2 tab by mouth once daily      . meloxicam (MOBIC) 7.5 MG tablet Take 15 mg by mouth daily. Take 2 tablets by mouth daily      . metoprolol tartrate (LOPRESSOR) 25 MG tablet Take 12.5 mg by mouth every morning.       . Multiple Vitamins-Iron (MULTIVITAMIN/IRON PO) Take 1 tablet by mouth daily.       . niacin (NIASPAN) 500 MG CR tablet Take 1 tablet (500 mg total) by mouth at bedtime.  90 tablet  3  . omeprazole (PRILOSEC) 20 MG capsule Take 20 mg by mouth every other day.        . Oxycodone HCl 20 MG TABS Take 1 tablet by mouth 4 (four) times daily as needed. PAIN.Marland KitchenMarland KitchenTake as directed per Dr. Vear Clock      . simvastatin (ZOCOR) 40 MG tablet Take 1/2 tablet by mouth at bedtime      . [DISCONTINUED] DULoxetine (CYMBALTA) 30 MG capsule Take 1 capsule (30 mg total) by mouth daily.  30 capsule  5   No facility-administered encounter medications on file as of 11/09/2012.     Allergies  Allergen Reactions  . Cymbalta (Duloxetine Hcl)     Facial edema  . Mepergan (Meperidine-Promethazine)     Cardiac arrest  . Iohexol      Code: HIVES, Desc: pt states her face and throat swells when administered IV contrast - KB, Onset Date: [id number]   . Valsartan     REACTION: pt states  INTOL to Diovan    Current Medications, Allergies, Past Medical History, Past Surgical History, Family History, and  Social History were reviewed in [employer] record.    Review of Systems         See HPI - all other systems neg except as noted... The patient complains of dyspnea on exertion, muscle weakness, and difficulty walking.  The patient denies anorexia, fever, weight loss, weight gain, vision loss, decreased hearing, hoarseness, chest pain, syncope, peripheral edema, prolonged cough, headaches, hemoptysis, abdominal pain, melena, hematochezia, severe indigestion/heartburn, hematuria, incontinence, suspicious skin lesions, transient blindness, depression, unusual weight change, abnormal bleeding, enlarged lymph nodes, and angioedema.     Objective:   Physical Exam     WD, WN, 67 y/o WF in NAD... GENERAL:  Alert & oriented; pleasant & cooperative... HEENT:  [place]/AT, EOM-wnl, PERRLA, EACs-clear, TMs-wnl, NOSE-clear, THROAT-clear & wnl. NECK:  Supple w/ fairROM; no JVD; normal carotid impulses w/o bruits; no thyromegaly or nodules palpated; no lymphadenopathy. CHEST:  Clear to P & A; without wheezes/ rales/ or rhonchi. HEART:  Regular Rhythm; without murmurs/ rubs/ or gallops. ABDOMEN:  Soft & nontender; normal bowel sounds; no organomegaly or masses detected. EXT: s/p right TKR, mod arthritic changes; no varicose veins/ +venous insuffic/ tr edema. BACK:  scar of surgery, +trigger points... NEURO:  CN's intact;  no focal neuro deficits... DERM:  tiny red spot upper inner left arm bicep area from prev tick bite- no tick remnants... dry skin, no signif rash noted.  RADIOLOGY DATA:  Reviewed in the EPIC EMR & discussed w/ the patient...  LABORATORY DATA:  Reviewed in the EPIC EMR & discussed w/ the patient...   Assessment & Plan:    HBP>  BP controlled on simple med + diet & exercise; we decided to keep the Metoprolol25mg - 1/2 tab...  CHOL>  Followed in  Lipid Clinic- TG still elev & instructed on better low fat diet; needs f/u w/ repeat FLP...  GI>  She denies GI issues at present on her vegan diet...  GU>  Similarly the prev IC is improved & hasn't been on meds (Elmiron) for many months...  DJD/ FM/ Chr Pain>  She is followed in the Pain management clinic & Rheum as noted above, & she had shot left knee from [facility]- improved; & shot in back from [employer] [facility].  MGUS>  Followed by [name], we do not have recent notes but she reports stable & he gave her some IV Fe previously & she felt sl better after this Rx.   Patient's Medications  New Prescriptions   No medications on file  Previous Medications   ALPRAZOLAM (XANAX) 0.5 MG TABLET    Take 1 tablet (0.5 mg total) by mouth 2 (two) times daily.   CHOLECALCIFEROL (VITAMIN D) 2000 UNITS CAPS    Take 2 capsules by mouth daily.    CITALOPRAM (CELEXA) 20 MG TABLET    Take 20 mg by mouth daily.   FISH OIL-OMEGA-3 FATTY ACIDS 1000 MG CAPSULE    Take 2 g by mouth daily.    HYDROCORTISONE 2.5 % CREAM    Apply 1 application topically as needed. RECTAL ITCHING   IBUPROFEN (ADVIL,MOTRIN) 600 MG TABLET    Take 1 tablet (600 mg total) by mouth every 6 (six) hours as needed for pain.   LEVOTHYROXINE (SYNTHROID, LEVOTHROID) 50 MCG TABLET    1/2 tab by mouth once daily   MELOXICAM (MOBIC) 7.5 MG TABLET    Take 15 mg by mouth daily. Take 2 tablets by mouth daily   METOPROLOL TARTRATE (LOPRESSOR) 25 MG TABLET    Take  12.5 mg by mouth every morning.    MULTIPLE VITAMINS-IRON (MULTIVITAMIN/IRON PO)    Take 1 tablet by mouth daily.    NIACIN (NIASPAN) 500 MG CR TABLET    Take 1 tablet (500 mg total) by mouth at bedtime.   OMEPRAZOLE (PRILOSEC) 20 MG CAPSULE    Take 20 mg by mouth every other day.     OXYCODONE HCL 20 MG TABS    Take 1 tablet by mouth 4 (four) times daily as needed. PAIN.Marland KitchenMarland KitchenTake as directed per Dr. Vear Clock   SIMVASTATIN (ZOCOR) 40 MG TABLET    Take 1/2 tablet by mouth at bedtime  Modified  Medications   No medications on file  Discontinued Medications   DULOXETINE (CYMBALTA) 30 MG CAPSULE    Take 1 capsule (30 mg total) by mouth daily.

## 2012-11-28 ENCOUNTER — Telehealth: Payer: Self-pay | Admitting: Pulmonary Disease

## 2012-11-28 MED ORDER — AMOXICILLIN-POT CLAVULANATE 875-125 MG PO TABS: 1.0000 | ORAL_TABLET | Freq: Two times a day (BID) | ORAL | Status: DC

## 2012-11-28 NOTE — Telephone Encounter (Signed)
Per SN: OTC mucinex 600 mg 2 bid, increased fluids, use nasal saline every 1-2 hours while awake, send in augmentin 875mg  1 po bid #14 no refills, and take Align OCT 1 po qd.  ------  augmentin rx sent to [email or web address] to inform pt of SN's recs

## 2012-11-28 NOTE — Telephone Encounter (Signed)
Called and spoke---drainage with green sputum.[name] in the morning with sore throat.  Cough with green sputum.  No fever.  No body aches.  Has used saline spray and benadryl but this has not helped very much.  Requesting something be called to the pharmacy.  SN please advise. Thanks  Allergies  Allergen Reactions  . Cymbalta (Duloxetine Hcl)     Facial edema  . Mepergan (Meperidine-Promethazine)     Cardiac arrest  . Iohexol      Code: HIVES, Desc: pt states her face and throat swells when administered IV contrast - KB, Onset Date: [id number]   . Valsartan     REACTION: pt states INTOL to Diovan

## 2012-11-29 NOTE — Telephone Encounter (Signed)
Pt is aware of SN recommendations and that Augmentin has been sent to her pharmacy. She verbalized understanding.

## 2012-11-29 NOTE — Telephone Encounter (Signed)
Returning call.  Please return call @ [phone]

## 2012-12-07 ENCOUNTER — Encounter: Payer: Self-pay | Admitting: *Deleted

## 2012-12-07 ENCOUNTER — Telehealth: Payer: Self-pay | Admitting: *Deleted

## 2012-12-07 ENCOUNTER — Other Ambulatory Visit: Payer: Self-pay | Admitting: *Deleted

## 2012-12-07 NOTE — Telephone Encounter (Signed)
Pt called wanting to know about her IgM level (589) from a recent with rheumatologist. She was told she needed to f/u with Dr Myna Hidalgo. Reviewed with Eunice Blase, PA. Her IgM was 497 in 10/13 and has fluctuated between 414 and 686 over the last 2 years. Ok to keep visit as scheduled in 10/14. Tried to call the pt x 4 times but the phone rings busy.

## 2012-12-28 DIAGNOSIS — M79609 Pain in unspecified limb: Secondary | ICD-10-CM | POA: Diagnosis not present

## 2012-12-28 DIAGNOSIS — M25569 Pain in unspecified knee: Secondary | ICD-10-CM | POA: Diagnosis not present

## 2013-02-01 ENCOUNTER — Ambulatory Visit: Payer: Medicare Other | Admitting: Pulmonary Disease

## 2013-02-16 ENCOUNTER — Telehealth: Payer: Self-pay | Admitting: Pulmonary Disease

## 2013-02-16 MED ORDER — FIRST-DUKES MOUTHWASH MT SUSP: OROMUCOSAL | Status: DC

## 2013-02-16 MED ORDER — AZITHROMYCIN 250 MG PO TABS: ORAL_TABLET | ORAL | Status: DC

## 2013-02-16 NOTE — Telephone Encounter (Signed)
Called and spoke with pt and pt stated that she is having a lot of PND and sore throat and is requesting a zpak to be called into the pharmacy.  Can we call in the MMW as well.  Pt stated that she did not want anything stronger since all the abx that she has taken in the past has started to turn her teeth a gray color.  SN please advise. Thanks  Allergies  Allergen Reactions  . Cymbalta (Duloxetine Hcl)     Facial edema  . Mepergan (Meperidine-Promethazine)     Cardiac arrest  . Iohexol      Code: HIVES, Desc: pt states her face and throat swells when administered IV contrast - KB, Onset Date: 16109604   . Valsartan     REACTION: pt states INTOL to Diovan

## 2013-02-16 NOTE — Telephone Encounter (Signed)
Per SN---  Ok to call in zpak #1   MMW  #4oz  1 tsp gargle and swallow four times daily as needed.  Called and spoke with pt and she is aware of SN recs and she can use delsym otc.

## 2013-02-20 DIAGNOSIS — M1710 Unilateral primary osteoarthritis, unspecified knee: Secondary | ICD-10-CM | POA: Diagnosis not present

## 2013-02-20 DIAGNOSIS — G894 Chronic pain syndrome: Secondary | ICD-10-CM | POA: Diagnosis not present

## 2013-02-20 DIAGNOSIS — M961 Postlaminectomy syndrome, not elsewhere classified: Secondary | ICD-10-CM | POA: Diagnosis not present

## 2013-02-20 DIAGNOSIS — Z79899 Other long term (current) drug therapy: Secondary | ICD-10-CM | POA: Diagnosis not present

## 2013-03-01 DIAGNOSIS — M1710 Unilateral primary osteoarthritis, unspecified knee: Secondary | ICD-10-CM | POA: Diagnosis not present

## 2013-03-01 DIAGNOSIS — M25569 Pain in unspecified knee: Secondary | ICD-10-CM | POA: Diagnosis not present

## 2013-03-01 DIAGNOSIS — G894 Chronic pain syndrome: Secondary | ICD-10-CM | POA: Diagnosis not present

## 2013-03-06 DIAGNOSIS — T84498A Other mechanical complication of other internal orthopedic devices, implants and grafts, initial encounter: Secondary | ICD-10-CM | POA: Diagnosis not present

## 2013-03-09 ENCOUNTER — Ambulatory Visit: Payer: Medicare Other | Admitting: Pulmonary Disease

## 2013-03-13 ENCOUNTER — Ambulatory Visit (INDEPENDENT_AMBULATORY_CARE_PROVIDER_SITE_OTHER): Payer: Medicare Other | Admitting: Pulmonary Disease

## 2013-03-13 ENCOUNTER — Encounter: Payer: Self-pay | Admitting: Pulmonary Disease

## 2013-03-13 VITALS — BP 120/90 | HR 57 | Temp 97.8°F | Ht 65.5 in | Wt 163.2 lb

## 2013-03-13 DIAGNOSIS — N3090 Cystitis, unspecified without hematuria: Secondary | ICD-10-CM

## 2013-03-13 DIAGNOSIS — K219 Gastro-esophageal reflux disease without esophagitis: Secondary | ICD-10-CM

## 2013-03-13 DIAGNOSIS — M899 Disorder of bone, unspecified: Secondary | ICD-10-CM

## 2013-03-13 DIAGNOSIS — E8809 Other disorders of plasma-protein metabolism, not elsewhere classified: Secondary | ICD-10-CM

## 2013-03-13 DIAGNOSIS — M1990 Unspecified osteoarthritis, unspecified site: Secondary | ICD-10-CM

## 2013-03-13 DIAGNOSIS — I10 Essential (primary) hypertension: Secondary | ICD-10-CM

## 2013-03-13 DIAGNOSIS — E039 Hypothyroidism, unspecified: Secondary | ICD-10-CM

## 2013-03-13 DIAGNOSIS — K5730 Diverticulosis of large intestine without perforation or abscess without bleeding: Secondary | ICD-10-CM | POA: Diagnosis not present

## 2013-03-13 DIAGNOSIS — IMO0001 Reserved for inherently not codable concepts without codable children: Secondary | ICD-10-CM

## 2013-03-13 DIAGNOSIS — E7800 Pure hypercholesterolemia, unspecified: Secondary | ICD-10-CM | POA: Diagnosis not present

## 2013-03-13 DIAGNOSIS — M545 Low back pain: Secondary | ICD-10-CM

## 2013-03-13 DIAGNOSIS — F411 Generalized anxiety disorder: Secondary | ICD-10-CM

## 2013-03-13 DIAGNOSIS — G894 Chronic pain syndrome: Secondary | ICD-10-CM

## 2013-03-13 DIAGNOSIS — D126 Benign neoplasm of colon, unspecified: Secondary | ICD-10-CM

## 2013-03-13 NOTE — Patient Instructions (Addendum)
Today we updated your med list in our EPIC system...    Continue your current medications the same...  Keep up the good work w/ diet & exercise...  Call for any questions...  Let's plan a follow up visit in 4-69mo, sooner if needed for problems.Marland KitchenMarland Kitchen

## 2013-03-13 NOTE — Progress Notes (Signed)
Subjective:    Patient ID: [name], female    DOB: 1946/02/07, 67 y.o.   MRN: [id number]  HPI 67 y/o WF here for a follow up visit... she has mult med problems and is well known to [place]... also followed in the Lipid Clinic, and by numerous specialists as noted below...   ~  August 02, 2011:  48mo ROV & CPX per pt request> she is doing well on husb's vegan diet & wt down 12# further to 166# today w/ BMI=26; still c/o tired, fatigued- but better after IV Fe by [name];  BP improved on Metoprolol & she wants to decr to 1/d;  FLP followed in the Lipid Clinic on Simva, Niacin, Fish Oil;  States bowels are improved on carrot/spinach juiced by husb daily;  Still followed by pain management & taking Percocet 20mg  every 4H (ave 3-4 per day) she says;  See prob list below>>  ~  Feb 01, 2012:  48mo ROV & [name] continues to feel terrible overall- states she is in constant pain from her knees & legs; on Oxcodone per [name] pain clinic (states she is taking 3-6 tabs daily); Husb is inquiring about 2 clinics he found on the internet one in [place] & one in [place] that do peripheral nerve surgery (I told him he was grasping & needed to discuss w/ her specialists if serious); she brought a BP log & pressures are ok but pulse is running low on her BBlocker so we agreed w/ decr Metoprolol 25mg - 1/2 Bid;  She is followed in the Lipid Clinic prev on Simva plus Niasp & Fish Oil, but husb wants her off Statins, they compromised & decr the Simva to 20mg /d & f/u labs pending...  We reviewed prob list, meds, xrays and labs>  See below>>    She saw DrFontaine, Gyn> routine check, atrophic changes, osteopenia- f/u BMD done 12/12 w/ lowest TScore -1.2 in right FemNeck...  ~  August 03, 2012:  48mo ROV & [name] continues to have a very difficult time w/ her FM, chronic pain syndrome, etc; she is going to contact DrDeveshwar for a follow up rheum eval....    HxOSA, AR, Sinusitis> c/o recent sore throat/ URI treated  w/ Keflex, MMW, Align & finally better...    HBP, CP, Palpit> on Metop25-1/2Qam; BP=140/82 today & 120s/60s at home; she denies recent CP, palpit, ch in SOB, edema, etc...    Chol> on Simva40-1/2daily & Niacin500; followed in the LC- last FLP 12/12 showed TChol 113, TG 87, HDL 36, LDL 60    Hypothyroid> on [id number]; TP started med 10/13 when TSH=5.93; pt notes she's not feeling any better; we discussed recheck TSH on return...    GI> GERD, Divertics, Polyps> on Prilosec20, Phen25 prn, AnusolHC; she denies abd pain, notes some intermit nausea, no d/c/ blood seen...    GU> IC> prev treatment w/ DMSO; states voiding OK, denies pain etc...    DJD, LBP, FM, Chr Pain Syndrome> followed by [name] on Oxycodone 20Qid prn, Mobic 7.5/d; and DrIbazedo- seen 9/13 w/ shot in back (helped)...    Osteopenia> on Ca, MVI, VitD; last BMD 12/12 showed TScore +0.7 in Spine, and -1.2 in right FemNeck...    Anxiety> on Alprazolam 0.5mg Tid prn...    MGUS> followed by [name] for IgM monoclonal gammopathy- seen 10/13 & labs w/o signif change x Fe sl low (pt rec to incr Fe in diet she says)... We reviewed prob list, meds, xrays and labs> see below for updates >>  she had the 2013 Flu vaccine 11/15... LABS 10/13:  Chems- ok w/ BS=115;  CBC- wnl;  Fe=36 (sat11%);  Ferritin=37; +Monoclonal IgM kappa paraprotein (no change in levels)...  ~  November 09, 2012:  61mo ROV & add-on s/p mult visits- 2 w/ TP, 2 ER visits, Ortho, Rheum, Neuro, & Pain management...     She had f/u eval by DrFontaine, GYN 12/13> c/o disch, neg wet prep, exam w/ atrophic changes, s/p hyst, mammogram 8/13 was neg, BMD 8/12 w/ TScore -1.2 & f/u in 2014 planned...    She had f/o Ortho, DrBassett & Rheum, DrDeveshwar 12/13> active FM w/ pain & pos trigger points, chr fatigue, severe pain, continued right knee pain despite TKR, LBP, SI joint pain, etc; they checked lab panel & [facility]...     She saw TP 1/14 c/o  jittery & nervous- she believed due to starting low dose Synthroid for TSH=5.92 in Oct2013; f/u TSH returned 2.42 on the med- continue same...    She went to the ER w/ confusion & memory loss> ER eval was neg- Exam, CXR, CTBrain, Labs, etc; husb reported that she drank 50 cans of coconut water over 1week- & told to decrease this; followed up in the office w/ TP & felt to be depressed- Rec trial Cymbalta30 & referred to Neuro for further eval...    Neuro told her that her memory was OK (note pending), & they changed Cymbalta to Celexa20 ("I'm allergic to the Cymbalta")    She is very jittery & tearful> says she doesn't want to leave the house, not exercising, etc; I wonder about Psyche- agoraphobia vs PTSD & suggest referral for eval &/or counseling (she will think about it)... We reviewed prob list, meds, xrays and labs> see below for updates >>  LABS 1/14:  Chems- wnl x BS=112;  CBC- wnl;  TSH=0.52 & FreeT4=1.18;  UA- clear   ~  March 13, 2013:  43mo ROV & [name] is followed in the pain clinic by [name]- states she is allergic to [facility] & therefore she can't use the patch; notes occas jerking & told this was poss a side effect of her oxycodone;  She remains under the care of Rheum, Ortho, Pain management> doing exercise & PT- feels she is stable overall... We reviewed the following medical problems during today's office visit >>     HxOSA, AR, Sinusitis> no recent infections; prev sleep eval 2010 w/ poor tol for CPAP, improved symptoms when she lost weight & denies prob w/ snoring, daytime hypersom, etc...    HBP, CP, Palpit> on Metop25-1/2Qam; BP=120/90 today & 120s/60s at home; she denies recent CP, palpit, ch in SOB, edema, etc...    Chol> on Simva40-1/2daily & Niacin500; prev followed in the LC- last FLP 12/12 showed TChol 113, TG 87, HDL 36, LDL 60; she needs to ret fasting for f/u blood work.    Hypothyroid> on Synthroid50-1/2 tab; TP started 51mcg/d 10/13 when TSH=5.93; pt feels bad all the  time (told it's NOT her thyroid); f/u TSH 1/14 = 0.52 & dose decr to 1/2 tab daily...    GI- GERD, Divertics, Polyps> on Prilosec20, Phen25 prn, AnusolHC; she denies abd pain, notes some intermit nausea, no d/c/ blood seen...    GU- IC> prev treatment w/ DMSO; states voiding OK, denies pain etc...    DJD, LBP, FM, Chr Pain Syndrome> followed by Drs. Deveshwar, Effie Shy on Oxycodone 20Qid prn, Mobic 7.5/d; DrIbazedo- seen 9/13  w/ shot in back (helped); see notes below...    Osteopenia> on Ca, MVI, VitD; last BMD 12/12 showed TScore +0.7 in Spine, and -1.2 in right FemNeck...    Anxiety> on Alprazolam 0.5mg Tid prn & Celexa20 added- this has helped her depression, less agoraphobia & PTSD symptoms, "doing much better" she says...     MGUS> followed by [name] for IgM monoclonal gammopathy- seen 10/13 & labs w/o signif change x Fe sl low (pt rec to incr Fe in diet she says)... We reviewed prob list, meds, xrays and labs> see below for updates >>             Problem List:  UNSPECIFIED SLEEP APNEA (ICD-780.57) - Complex sleep apnea - PSG 07/25/09 AHI 28 > ALLERGIC RHINITIS (ICD-477.9) Hx of SINUSITIS (ICD-473.9) - Hx sinusitis followed [name] at [facility]  ~  Sleep eval [name] w/ 11/10 study showing AHI= 25 w/ obstructive & central events... CPAP titration was suboptimal & she was tried on BiPAP... she tells me that she stopped all this when she lost weight & husb confirms no snoring or sleep disordered breathing- rests well etc... Denies daytime sleepiness or issues...  HYPERTENSION (ICD-401.9) >>  ~  on METOPROLOL 25mg  Qam; she was hosp 3/09 w/ gastroenteritis & BP meds were stopped due to weakness; grad restarted w/ improvement in BP & palpit... ~  11/11: BP= 122/70 she is INTOL to Diovan; denies HA, visual symptoms, CP, palpit, dizzy, syncope, edema; husb notes that BPs are similar at home ~120/70's... ~  5/12:  BP= 112/70 & she has the same chr fatigue complaints, but no specific  symptoms... ~  11/12:  BP=130/80 & she has the same chronic complaints but denies CP, syncope, ch in SOB, edema; [facility]; she wants to decr the Metoprolol to 25mg  daily- ok... ~  CXR 11/12 showed clear lungs, normal heart size, mild DJD TSpine... ~  5/13:  BP= 142/80 & she persists w/ mult chronic complaints... ~  11/13: on Metop25-1/2Qam; BP=140/82 today & 120s/60s at home; she denies recent CP, palpit, ch in SOB, edema, etc. ~  2/14:  BP= 142/82 & she appears unchanged... ~  6/14: on Metop25-1/2Qam; BP=120/90 today & 120s/60s at home; she denies recent CP, palpit, ch in SOB, edema, etc.  CHEST PAIN (ICD-786.50) - seen by [name] in 2006... ~  2DEcho 11/98 showed norm valves, norm LVF, ? min LVH... ~  cath 7/03 w/ norm coronaries, & norm LVF... ~  NuclearStressTest 2/06 showed no infarct, no ischemia, EF=65%... ~  Myoview repeated 4/10: low risk study w/ soft tissue attenuation, normal wall motion, EF= 78%... ~  EKG 11/12 showed NSR, WNL.[name]. ~  CXR 1/14 via ER showed normal heart size, clear, NAD... ~  EKG 1/14 showed NSR, rate66, borderline IVCD, otherw wnl, NAD...  PALPITATIONS, HX OF (ICD-V12.50) - notes occas fluttering and "jello feeling" in her chest... Metoprolol helps.  HYPERCHOLESTEROLEMIA (ICD-272.0) - on ZOCOR 40mg Qhs + Fish Oil  + OTC Niacin> followed in the LC. ~  FLP 01/03/08 on SIMVASTATIN 20mg /d showed TChol 206, TG 182, HDL 31, LDL 145... ~  FLP 6/09 (on Simva40) showed TChol 173, TG 199, HDL 31, LDL 102... continue f/u in lipid clinic. ~  FLP 11/09 (on Simva40) showed TChol 196, TG 265, HDL 32, LDL 119... she stopped lipid clinic. ~  Due to continued "side effects" she placed the Simvastatin on hold, then restarted 40mg /d... ~  FLP 6/10 showed TChol 218, TG 394, HDL  33, LDL 129...  ~  FLP 11/10 showed TChol 262, TG 568, HDL 31, LDL 144... she needs to ret to the Lipid Clinic. ~  FLP 1/11 after 20# wt loss showed TChol 144, TG 181, HDL 43, LDL 65 on  Simva40 ~  FLP 16/10 on Simva40+FishOil showed TChol 145, TG 237, HDL 33, LDL 79 ~  FLP 2/12 on Simva40+FishOil showed TChol 126, TG 238, HDL 27, LDL 66 ~  FLP 6/12 in EPIC per LC showed TChol 160, TG 226, HDL 31, LDL 92... Continue meds, low fat diet, exercise. ~  FLP 12/12 by LC on Simva40+Niasp500 showed TChol 113, TG 87, HDL 36, LDL 60 ~  She continues regular f/u in Lipid Clinic => needs to ret for FLP here  GERD (ICD-530.81) - on PRILOSEC 20mg /d, and Phenergan as needed... ~  prev EGD 8/05 showed 3cmHH, gastric polyp... prev EGD w/ stricture dilated. ~  EGD repeated 12/09 & showed 5cm HH w/ healing ulcer, esophagitis, gastritis, etc... HPylori neg... REC> continue Prilosec, Phenergan. ~  she saw [name], [name] 8/10 for eval of nausea- no help from Reglan, Scopalamine, etc... had UGI/ Ba swallow- sm HH, mild reflux, norm peristalsis;  Gastric Emptying Scan- normal;  EGD 9/10 by [facility] w/ esoph ulcer seen, neg CLO, benign gastric polyp removed, Rx'd PPI. ~  5/12:  She states that she no longer has any GI issues on her vegan diet... ~  11/12:  She notes that Gabriel Rung gives her juiced carrots and spinach daily...  DIVERTICULOSIS OF COLON (ICD-562.10) - Rx w/ PROCTOSOL HC Cream Prn, and stool softeners... COLONIC POLYPS (ICD-211.3) - colonoscopy 8/07 by [name] showed divertics only...  CYSTITIS (ICD-595.9) - she was seen at [facility] for poss interstitial cystitis... prev DMSO Rx. ~  Eval by [name] & pt tells me she is on Elmiron both orally & via bladder (self-cath). ~  9/11:  she reports IC is quiet & off Elmiron Rx now. ~  11/12:  She confirms min symptoms at present...  DEGENERATIVE JOINT DISEASE (ICD-715.90) - prev Rx MOBIC 7.5mg  Prn, now on OXYCODONE- taking daily for her pain per [name]... she had a right TKR 12/07 by [name]... then required right Patella surg 10/08 from [name]... now s/p right TKR revision by [facility]Jinna at [facility]- they referred to pain  management... ~  9/11: she tells me that [name] is referring her to [facility] for another opinion regarding her knees ([name]- there was nothing they could do & rec Pain Management, [facility], & veggie diet). ~  11/12:  She reports left knee bone-on-bone w/ shot from [facility] 10/12 which helped; still followed by Pain Management & she tells me she's taking Percocet 10mg  every 4H (ave 3-4 per day). ~  11/13:  She continues to follow up w/ [name] pain clinic & reports taking Oxycod 20mg  3-4 tabs daily, plus Mobic7.5.Marland KitchenMarland Kitchen ~  12/13: she had f/u Ortho DrBassett at [facility] for leg swelling- neg VenDopplers w/o DVT & rec to avoid sodium, elev legs, wear support hose; given HC cream for dermatitis... ~  2/14: she saw DrVoytek & was given shots in her right hip bursa & left knee...   BACK PAIN, LUMBAR (ICD-724.2) - she had a right L4-5 decompressive laminotomy w/ foraminotomy 12/02 by DrPool... then L4-5 decompression & fusion 9/09 by DrPool due to DDD w/ synovial cyst & degen spondylolithesis w/ stenosis... f/u XRays and MRI 5/10 showed fusion intact, NAD.[name]. ~  CHRONIC PAIN MANAGEMENT> followed by [name] clinic  now, on OXYCODONE 10-20mg  every 4H prn (ave 3-4/d she says), plus MOBIC 7.5mg /d. ~  9/13:  She reports that she had ESI from DrIbazedo, w/ some improvement...  FIBROMYALGIA (ICD-729.1) - she continues to have mult somatic complaints... she has seen DrDeveshwar in the past...  ~  She reports trial of Accupuncture> it helped the FM but not the knee pain. ~  husb has inquired about periph nerve surg he researched on the internet, he is referred to her specialists to discuss options... ~  11/13:  They indicate that they have a f/u appt w/ DrDeveshwar for Rheum... ~  12/13:  She had Rheum eval by DrDeveshwar> active FM w/ pain & pos trigger points, chr fatigue, severe pain, continued right knee pain despite TKR, LBP, SI joint pain, etc; they checked lab panel & rec Neurontin trial [facility]  [facility].  OSTEOPENIA (ICD-733.90) - on calcium, MVI, Vit D... Followed by Gyn- DrFontaine. VITAMIN D DEFICIENCY (ICD-268.9) -  ~  Vit D level was 5 (30-90) 11/08 & rec to start VitD 50K/wk... ~  Vit D level 11/09 = 45  ~  Vit D level 6/10 = 23 ~  Vit D level 11/10 = 35 ~  Vit D level 5/11 = 70... switched to Vit D 2000u daily. ~  BMD 12/12 by DrFontaine showed lowest measured site= right FemNeck -1.2  MEMORY LOSS >> ~  1/14: she was eval by DrAthar at [facility]> MMSE was 30/30 but he noted her mult diagnoses, pain meds, & depression; they rec neuropsyche testing 7 brain MRI but she wanted to wait...   ANXIETY (ICD-300.00) - on ALPRAZOLAM 0.5mg  as needed- taking 3 per day recently (she notes mild tremor & this helps). DEPRESSION (ICD-311) ~  CT Brain 1/14 via ER showed a sm pineal lipoma, mild global atrophy, right max sinus opacification & mucosal thickening, NAD... ~  2/14: She is very jittery & tearful> says she doesn't want to leave the house, not exercising, etc; I wonder about Psyche- agoraphobia vs PTSD & suggest referral for eval &/or counseling=> she preferred meds, try Celexa20... ~  6/14: on Alprazolam 0.5mg Tid prn & Celexa20 added- this has helped her depression, less agoraphobia & PTSD symptoms, "doing much better" she says...  Hx of PSORIASIS (ICD-696.1)   Hx of UNSPECIFIED DISORDER PLASMA PROTEIN METABOLISM (ICD-273.9) - she has a biclonal gammopathy evaluated 10/05 by [name] who thought it was reactive, perhaps a MGUS variant... ~  f/u labs 11/09 showed Hg= 12.2, TProt= 7.1, SPE/ IEP w/ biclonal IgM kappa protein present accounting for 0.31g/dl of the total 1.61 g/dl of protein in the gamma region & IgM level = 319 mg/dl... ~  labs 11/10 showed Hg= 12.5, TProt= 7.8, SPEP/IEP w/ monoclonal IgM kappa & IgM lamba proteins accounting for 0.44 g/dl of the total 0.96 g/dl in the gamma region... IgM level = 490mg /dl... ~  11/10:  Referred back to [name]  who has resumed follow up of this problem... ~  4/11:  [name] did CT Chest/ Abd/ Pelvis> neg w/o adenopathy etc... ~  2012> she was given IV iron by [name] & reports feeling some better... ~  11/12:  Labs here showed Hg= 13.7, Fe= 61 (18%sat) ~  Labs from [employer] (once yearly in the fall) reviewed in EPIC flow sheet ==> IgM monoclonal gammopathy w/ stable parameters.   Past Surgical History  Procedure Laterality Date  . Laminotomy  2010    L-4, L-5 FUSION  . Knee arthroscopy  RIGHT KNEE X 5  . Total knee arthroplasty  12-07 and 4-07    Dr. Shelle Iron, revision 12-09 Dr. Eileen Stanford at [facility]  . Patella fracture surgery  10-08    Dr. Charlann Boxer  . Posterior laminectomy / decompression lumbar spine  9-09    Dr. Jordan Likes  . Cholecystectomy    . Hand surgery      TRIGGER FINGER  . Hernia repair    . Laparoscopic oophorectomy  2010    BILATERAL  . Abdominal hysterectomy      for prolapse with abdominal incision  . Oophorectomy      BSO    Outpatient Encounter Prescriptions as of 03/13/2013  Medication Sig Dispense Refill  . ALPRAZolam (XANAX) 0.5 MG tablet Take 1 tablet (0.5 mg total) by mouth 2 (two) times daily.  60 tablet  2  . Cholecalciferol (VITAMIN D) 2000 UNITS CAPS Take 2 capsules by mouth daily.       . citalopram (CELEXA) 20 MG tablet Take 20 mg by mouth daily.      . Diphenhyd-Hydrocort-Nystatin (FIRST-DUKES MOUTHWASH) SUSP Take 1 tsp gargle and swallow four times daily as needed.  120 mL  5  . fish oil-omega-3 fatty acids 1000 MG capsule Take 2 g by mouth daily.       . hydrocortisone 2.5 % cream Apply 1 application topically as needed. RECTAL ITCHING      . levothyroxine (SYNTHROID, LEVOTHROID) 50 MCG tablet 1/2 tab by mouth once daily      . meloxicam (MOBIC) 7.5 MG tablet Take 15 mg by mouth daily. Take 2 tablets by mouth daily      . metoprolol tartrate (LOPRESSOR) 25 MG tablet Take 12.5 mg by mouth every morning.       . Multiple Vitamins-Iron (MULTIVITAMIN/IRON PO) Take 1  tablet by mouth daily.       . niacin (NIASPAN) 500 MG CR tablet Take 1 tablet (500 mg total) by mouth at bedtime.  90 tablet  3  . omeprazole (PRILOSEC) 20 MG capsule Take 20 mg by mouth every other day.        . Oxycodone HCl 20 MG TABS Take 1 tablet by mouth 4 (four) times daily as needed. PAIN.Marland KitchenMarland KitchenTake as directed per Dr. Vear Clock      . simvastatin (ZOCOR) 40 MG tablet Take 1/2 tablet by mouth at bedtime      . amoxicillin-clavulanate (AUGMENTIN) 875-125 MG per tablet Take 1 tablet by mouth 2 (two) times daily.  14 tablet  0  . [DISCONTINUED] azithromycin (ZITHROMAX) 250 MG tablet Take as directed  6 tablet  0  . [DISCONTINUED] ibuprofen (ADVIL,MOTRIN) 600 MG tablet Take 1 tablet (600 mg total) by mouth every 6 (six) hours as needed for pain.  30 tablet  0   No facility-administered encounter medications on file as of 03/13/2013.     Allergies  Allergen Reactions  . Cymbalta (Duloxetine Hcl)     Facial edema  . Mepergan (Meperidine-Promethazine)     Cardiac arrest  . Iohexol      Code: HIVES, Desc: pt states her face and throat swells when administered IV contrast - KB, Onset Date: [id number]   . Valsartan     REACTION: pt states INTOL to Diovan    Current Medications, Allergies, Past Medical History, Past Surgical History, Family History, and Social History were reviewed in [employer] record.    Review of Systems         See HPI - all  other systems neg except as noted... The patient complains of dyspnea on exertion, muscle weakness, and difficulty walking.  The patient denies anorexia, fever, weight loss, weight gain, vision loss, decreased hearing, hoarseness, chest pain, syncope, peripheral edema, prolonged cough, headaches, hemoptysis, abdominal pain, melena, hematochezia, severe indigestion/heartburn, hematuria, incontinence, suspicious skin lesions, transient blindness, depression, unusual weight change, abnormal bleeding, enlarged lymph nodes, and  angioedema.     Objective:   Physical Exam     WD, WN, 67 y/o WF in NAD... GENERAL:  Alert & oriented; pleasant & cooperative... HEENT:  [place]/AT, EOM-wnl, PERRLA, EACs-clear, TMs-wnl, NOSE-clear, THROAT-clear & wnl. NECK:  Supple w/ fairROM; no JVD; normal carotid impulses w/o bruits; no thyromegaly or nodules palpated; no lymphadenopathy. CHEST:  Clear to P & A; without wheezes/ rales/ or rhonchi. HEART:  Regular Rhythm; without murmurs/ rubs/ or gallops. ABDOMEN:  Soft & nontender; normal bowel sounds; no organomegaly or masses detected. EXT: s/p right TKR, mod arthritic changes; no varicose veins/ +venous insuffic/ tr edema. BACK:  scar of surgery, +trigger points... NEURO:  CN's intact;  no focal neuro deficits... DERM:  tiny red spot upper inner left arm bicep area from prev tick bite- no tick remnants... dry skin, no signif rash noted.  RADIOLOGY DATA:  Reviewed in the EPIC EMR & discussed w/ the patient...  LABORATORY DATA:  Reviewed in the EPIC EMR & discussed w/ the patient...   Assessment & Plan:    HBP>  BP controlled on simple med + diet & exercise; we decided to keep the Metoprolol25mg - 1/2 tab...  CHOL>  Prev followed in Lipid Clinic- needs to ret fasting for FLP recheck...  GI>  She denies GI issues at present on her vegan diet...  GU>  Similarly the prev IC is improved & hasn't been on meds (Elmiron) for many months...  DJD/ FM/ Chr Pain>  She is followed in the Pain management clinic & Rheum as noted above, & she had shot left knee from [facility]- improved; & shot in back from [employer] [facility] [facility].  MGUS>  Followed by [name], we do not have recent notes but she reports stable & he gave her some IV Fe previously & she felt sl better after this Rx.  ANXIETY/ DEPRESSION> on Alpraz prn & Celexa20- improved & she credits her sister for help getting out some...   Patient's Medications  New Prescriptions   No medications on file  Previous Medications   ALPRAZOLAM (XANAX)  0.5 MG TABLET    Take 1 tablet (0.5 mg total) by mouth 2 (two) times daily.   AMOXICILLIN-CLAVULANATE (AUGMENTIN) 875-125 MG PER TABLET    Take 1 tablet by mouth 2 (two) times daily.   CHOLECALCIFEROL (VITAMIN D) 2000 UNITS CAPS    Take 2 capsules by mouth daily.    CITALOPRAM (CELEXA) 20 MG TABLET    Take 20 mg by mouth daily.   DIPHENHYD-HYDROCORT-NYSTATIN (FIRST-DUKES MOUTHWASH) SUSP    Take 1 tsp gargle and swallow four times daily as needed.   FISH OIL-OMEGA-3 FATTY ACIDS 1000 MG CAPSULE    Take 2 g by mouth daily.    HYDROCORTISONE 2.5 % CREAM    Apply 1 application topically as needed. RECTAL ITCHING   LEVOTHYROXINE (SYNTHROID, LEVOTHROID) 50 MCG TABLET    1/2 tab by mouth once daily   MELOXICAM (MOBIC) 7.5 MG TABLET    Take 15 mg by mouth daily. Take 2 tablets by mouth daily   METOPROLOL TARTRATE (LOPRESSOR) 25 MG TABLET    Take 12.5  mg by mouth every morning.    MULTIPLE VITAMINS-IRON (MULTIVITAMIN/IRON PO)    Take 1 tablet by mouth daily.    NIACIN (NIASPAN) 500 MG CR TABLET    Take 1 tablet (500 mg total) by mouth at bedtime.   OMEPRAZOLE (PRILOSEC) 20 MG CAPSULE    Take 20 mg by mouth every other day.     OXYCODONE HCL 20 MG TABS    Take 1 tablet by mouth 4 (four) times daily as needed. PAIN.Marland KitchenMarland KitchenTake as directed per Dr. Vear Clock   SIMVASTATIN (ZOCOR) 40 MG TABLET    Take 1/2 tablet by mouth at bedtime  Modified Medications   No medications on file  Discontinued Medications   AZITHROMYCIN (ZITHROMAX) 250 MG TABLET    Take as directed   IBUPROFEN (ADVIL,MOTRIN) 600 MG TABLET    Take 1 tablet (600 mg total) by mouth every 6 (six) hours as needed for pain.

## 2013-03-22 DIAGNOSIS — M47817 Spondylosis without myelopathy or radiculopathy, lumbosacral region: Secondary | ICD-10-CM | POA: Diagnosis not present

## 2013-03-22 DIAGNOSIS — G894 Chronic pain syndrome: Secondary | ICD-10-CM | POA: Diagnosis not present

## 2013-03-22 DIAGNOSIS — M1710 Unilateral primary osteoarthritis, unspecified knee: Secondary | ICD-10-CM | POA: Diagnosis not present

## 2013-04-19 DIAGNOSIS — M1710 Unilateral primary osteoarthritis, unspecified knee: Secondary | ICD-10-CM | POA: Diagnosis not present

## 2013-04-19 DIAGNOSIS — G894 Chronic pain syndrome: Secondary | ICD-10-CM | POA: Diagnosis not present

## 2013-04-19 DIAGNOSIS — M47817 Spondylosis without myelopathy or radiculopathy, lumbosacral region: Secondary | ICD-10-CM | POA: Diagnosis not present

## 2013-04-25 ENCOUNTER — Other Ambulatory Visit: Payer: Self-pay

## 2013-04-30 ENCOUNTER — Other Ambulatory Visit: Payer: Self-pay | Admitting: Gynecology

## 2013-04-30 DIAGNOSIS — Z1231 Encounter for screening mammogram for malignant neoplasm of breast: Secondary | ICD-10-CM

## 2013-05-17 ENCOUNTER — Ambulatory Visit (HOSPITAL_COMMUNITY): Payer: Medicare Other

## 2013-05-17 DIAGNOSIS — M76899 Other specified enthesopathies of unspecified lower limb, excluding foot: Secondary | ICD-10-CM | POA: Diagnosis not present

## 2013-05-17 DIAGNOSIS — M1710 Unilateral primary osteoarthritis, unspecified knee: Secondary | ICD-10-CM | POA: Diagnosis not present

## 2013-05-17 DIAGNOSIS — G894 Chronic pain syndrome: Secondary | ICD-10-CM | POA: Diagnosis not present

## 2013-05-17 DIAGNOSIS — F329 Major depressive disorder, single episode, unspecified: Secondary | ICD-10-CM | POA: Diagnosis not present

## 2013-05-17 DIAGNOSIS — M961 Postlaminectomy syndrome, not elsewhere classified: Secondary | ICD-10-CM | POA: Diagnosis not present

## 2013-05-18 ENCOUNTER — Other Ambulatory Visit: Payer: Self-pay | Admitting: Pulmonary Disease

## 2013-05-22 ENCOUNTER — Ambulatory Visit (HOSPITAL_COMMUNITY)
Admission: RE | Admit: 2013-05-22 | Discharge: 2013-05-22 | Disposition: A | Discharge status: Home / Self Care | Payer: Medicare Other | Source: Ambulatory Visit | Attending: Gynecology | Admitting: Gynecology

## 2013-05-22 DIAGNOSIS — Z1231 Encounter for screening mammogram for malignant neoplasm of breast: Secondary | ICD-10-CM | POA: Diagnosis not present

## 2013-06-14 DIAGNOSIS — G894 Chronic pain syndrome: Secondary | ICD-10-CM | POA: Diagnosis not present

## 2013-06-14 DIAGNOSIS — M961 Postlaminectomy syndrome, not elsewhere classified: Secondary | ICD-10-CM | POA: Diagnosis not present

## 2013-06-14 DIAGNOSIS — F329 Major depressive disorder, single episode, unspecified: Secondary | ICD-10-CM | POA: Diagnosis not present

## 2013-06-14 DIAGNOSIS — M1710 Unilateral primary osteoarthritis, unspecified knee: Secondary | ICD-10-CM | POA: Diagnosis not present

## 2013-06-27 ENCOUNTER — Encounter: Payer: Self-pay | Admitting: Hematology & Oncology

## 2013-06-27 ENCOUNTER — Other Ambulatory Visit: Payer: Self-pay | Admitting: *Deleted

## 2013-06-27 ENCOUNTER — Other Ambulatory Visit (HOSPITAL_BASED_OUTPATIENT_CLINIC_OR_DEPARTMENT_OTHER): Payer: Medicare Other | Admitting: Lab

## 2013-06-27 ENCOUNTER — Ambulatory Visit (HOSPITAL_BASED_OUTPATIENT_CLINIC_OR_DEPARTMENT_OTHER): Payer: Medicare Other | Admitting: Hematology & Oncology

## 2013-06-27 VITALS — BP 147/71 | HR 63 | Temp 97.8°F | Resp 14 | Ht 66.0 in | Wt 168.0 lb

## 2013-06-27 DIAGNOSIS — E8809 Other disorders of plasma-protein metabolism, not elsewhere classified: Secondary | ICD-10-CM

## 2013-06-27 DIAGNOSIS — D509 Iron deficiency anemia, unspecified: Secondary | ICD-10-CM

## 2013-06-27 DIAGNOSIS — G609 Hereditary and idiopathic neuropathy, unspecified: Secondary | ICD-10-CM

## 2013-06-27 DIAGNOSIS — C9000 Multiple myeloma not having achieved remission: Secondary | ICD-10-CM | POA: Diagnosis not present

## 2013-06-27 DIAGNOSIS — D472 Monoclonal gammopathy: Secondary | ICD-10-CM | POA: Diagnosis not present

## 2013-06-27 HISTORY — DX: Iron deficiency anemia, unspecified: D50.9

## 2013-06-27 LAB — CBC WITH DIFFERENTIAL (CANCER CENTER ONLY)
BASO%: 0.4 % (ref 0.0–2.0)
EOS%: 4.8 % (ref 0.0–7.0)
HCT: 39.6 % (ref 34.8–46.6)
LYMPH%: 42 % (ref 14.0–48.0)
MCH: 29.2 pg (ref 26.0–34.0)
MCHC: 33.1 g/dL (ref 32.0–36.0)
MCV: 88 fL (ref 81–101)
MONO%: 5.8 % (ref 0.0–13.0)
NEUT%: 47 % (ref 39.6–80.0)
RDW: 12.9 % (ref 11.1–15.7)

## 2013-06-27 NOTE — Progress Notes (Signed)
This office note has been dictated.

## 2013-06-28 LAB — IRON AND TIBC CHCC
%SAT: 23 % (ref 21–57)
TIBC: 349 ug/dL (ref 236–444)
UIBC: 270 ug/dL (ref 120–384)

## 2013-06-28 NOTE — Progress Notes (Signed)
DIAGNOSES: 1. IgM kappa monoclonal gammopathy of undetermined significance. 2. Intermittent iron deficiency anemia.  CURRENT THERAPY:  IV iron as indicated.  INTERIM HISTORY:  [name] comes in for followup.  We last saw her a year ago.  She is still having issues with this neuropathy.  When we last saw her, she had a monoclonal spike of 0.35 g/dL.  IgM level was 497 mg/dL.  Kappa light chain was 0.9 mg/dL.  This was all holding steady.  Her last iron studies showed iron saturation of 11% with a ferritin of 37.  I am not sure when her last iron was given.  She does feel tired. She always feels tired.  She is having a lot of other health issues. There is a lot of stress at home right now.  She has had no obvious bleeding.  There has been no nausea or vomiting.  She has had no fevers, sweats or chills.  There has been no rashes.  PHYSICAL EXAMINATION:  General:  This is a fairly well-developed, well- nourished white female in no obvious distress.  Vital signs: Temperature of 97.8, pulse 63, respiratory rate 14, blood pressure 147/70.  Weight is 168.  Head and neck:  Normocephalic, atraumatic skull.  There are no ocular or oral lesions.  There are no palpable cervical or supraclavicular lymph nodes.  Lungs:  Clear bilaterally. Cardiac:  Regular rate and rhythm with a normal S1, S2.  There are no murmurs, rubs or bruits.  Abdomen:  Soft.  She has good bowel sounds. There is no fluid wave.  There is no palpable abdominal mass.  There is no palpable hepatosplenomegaly.  Extremities:  Show no clubbing, cyanosis or edema.  Neurological:  No focal neurological deficits.  LABORATORY STUDIES:  White cell count is 5, hemoglobin 13.1, hematocrit 39.6, platelet count 180.  MCV is 88. Total protein is 6.7 with an albumin of 3.9.  BUN 8, creatinine 0.89.  IMPRESSION:  [name] is a nice 67 year old white female.  She has an IgM kappa monoclonal gammopathy of undetermined significance.   Again, we have been following this now for over 2 years.  This actually has gotten a little bit better with each visit.  I still do not believe that this is causing any issues for her.  We will have to see what her iron studies show.  Again, she is definitely borderline with her last visit.  We will have to see what they are this time.  Her hemoglobin actually is better. We will plan to get her back to see [place] in another year.  We certainly can get her back sooner if there is any problems.    ______________________________ Josph Macho, M.D. PRE/MEDQ  D:  06/27/2013  T:  06/28/2013  Job:  [id number]

## 2013-06-29 LAB — COMPREHENSIVE METABOLIC PANEL
AST: 16 U/L (ref 0–37)
Alkaline Phosphatase: 95 U/L (ref 39–117)
BUN: 8 mg/dL (ref 6–23)
Creatinine, Ser: 0.89 mg/dL (ref 0.50–1.10)
Glucose, Bld: 87 mg/dL (ref 70–99)

## 2013-06-29 LAB — SPEP & IFE WITH QIG
Albumin ELP: 55.6 % — ABNORMAL LOW (ref 55.8–66.1)
Alpha-1-Globulin: 4.3 % (ref 2.9–4.9)
Beta 2: 7.4 % — ABNORMAL HIGH (ref 3.2–6.5)
Beta Globulin: 6.4 % (ref 4.7–7.2)
IgM, Serum: 392 mg/dL — ABNORMAL HIGH (ref 52–322)
Total Protein, Serum Electrophoresis: 6.7 g/dL (ref 6.0–8.3)

## 2013-06-29 LAB — KAPPA/LAMBDA LIGHT CHAINS
Kappa free light chain: 1.69 mg/dL (ref 0.33–1.94)
Lambda Free Lght Chn: 1.82 mg/dL (ref 0.57–2.63)

## 2013-07-06 ENCOUNTER — Telehealth: Payer: Self-pay | Admitting: Nurse Practitioner

## 2013-07-06 NOTE — Telephone Encounter (Addendum)
Message copied by Glee Arvin on Fri Jul 06, 2013  2:39 PM ------      Message from: Josph Macho      Created: Fri Jun 29, 2013  7:34 AM       Call - labs and iron look ok!!  Cindee Lame ------LVM and informed pt of labs and iron levels being ok per Dr. Myna Hidalgo.

## 2013-07-10 ENCOUNTER — Encounter: Payer: Self-pay | Admitting: Pulmonary Disease

## 2013-07-13 DIAGNOSIS — F329 Major depressive disorder, single episode, unspecified: Secondary | ICD-10-CM | POA: Diagnosis not present

## 2013-07-13 DIAGNOSIS — G894 Chronic pain syndrome: Secondary | ICD-10-CM | POA: Diagnosis not present

## 2013-07-13 DIAGNOSIS — M1710 Unilateral primary osteoarthritis, unspecified knee: Secondary | ICD-10-CM | POA: Diagnosis not present

## 2013-07-13 DIAGNOSIS — M961 Postlaminectomy syndrome, not elsewhere classified: Secondary | ICD-10-CM | POA: Diagnosis not present

## 2013-07-16 ENCOUNTER — Encounter: Payer: Self-pay | Admitting: *Deleted

## 2013-07-17 ENCOUNTER — Telehealth: Payer: Self-pay | Admitting: Pulmonary Disease

## 2013-07-17 MED ORDER — LEVOTHYROXINE SODIUM 50 MCG PO TABS: ORAL_TABLET | ORAL | Status: DC

## 2013-07-17 NOTE — Telephone Encounter (Signed)
Levothyroxine #15 Take 1/2 tab daily Sent to [facility] Pt aware.

## 2013-07-25 DIAGNOSIS — M1710 Unilateral primary osteoarthritis, unspecified knee: Secondary | ICD-10-CM | POA: Diagnosis not present

## 2013-07-26 ENCOUNTER — Other Ambulatory Visit: Payer: Self-pay

## 2013-08-09 DIAGNOSIS — M961 Postlaminectomy syndrome, not elsewhere classified: Secondary | ICD-10-CM | POA: Diagnosis not present

## 2013-08-09 DIAGNOSIS — Z79899 Other long term (current) drug therapy: Secondary | ICD-10-CM | POA: Diagnosis not present

## 2013-08-09 DIAGNOSIS — G894 Chronic pain syndrome: Secondary | ICD-10-CM | POA: Diagnosis not present

## 2013-08-09 DIAGNOSIS — M1710 Unilateral primary osteoarthritis, unspecified knee: Secondary | ICD-10-CM | POA: Diagnosis not present

## 2013-08-13 ENCOUNTER — Encounter: Payer: Self-pay | Admitting: Pulmonary Disease

## 2013-08-13 ENCOUNTER — Other Ambulatory Visit (INDEPENDENT_AMBULATORY_CARE_PROVIDER_SITE_OTHER): Payer: Medicare Other

## 2013-08-13 ENCOUNTER — Ambulatory Visit (INDEPENDENT_AMBULATORY_CARE_PROVIDER_SITE_OTHER): Payer: Medicare Other | Admitting: Pulmonary Disease

## 2013-08-13 VITALS — BP 122/70 | HR 64 | Temp 97.7°F | Ht 65.5 in | Wt 175.2 lb

## 2013-08-13 DIAGNOSIS — F411 Generalized anxiety disorder: Secondary | ICD-10-CM | POA: Diagnosis not present

## 2013-08-13 DIAGNOSIS — G894 Chronic pain syndrome: Secondary | ICD-10-CM

## 2013-08-13 DIAGNOSIS — E039 Hypothyroidism, unspecified: Secondary | ICD-10-CM | POA: Insufficient documentation

## 2013-08-13 DIAGNOSIS — J329 Chronic sinusitis, unspecified: Secondary | ICD-10-CM

## 2013-08-13 DIAGNOSIS — E7800 Pure hypercholesterolemia, unspecified: Secondary | ICD-10-CM

## 2013-08-13 DIAGNOSIS — I10 Essential (primary) hypertension: Secondary | ICD-10-CM | POA: Diagnosis not present

## 2013-08-13 DIAGNOSIS — M1990 Unspecified osteoarthritis, unspecified site: Secondary | ICD-10-CM

## 2013-08-13 DIAGNOSIS — D126 Benign neoplasm of colon, unspecified: Secondary | ICD-10-CM

## 2013-08-13 DIAGNOSIS — K219 Gastro-esophageal reflux disease without esophagitis: Secondary | ICD-10-CM

## 2013-08-13 DIAGNOSIS — IMO0001 Reserved for inherently not codable concepts without codable children: Secondary | ICD-10-CM

## 2013-08-13 DIAGNOSIS — K5730 Diverticulosis of large intestine without perforation or abscess without bleeding: Secondary | ICD-10-CM

## 2013-08-13 DIAGNOSIS — M545 Low back pain: Secondary | ICD-10-CM

## 2013-08-13 DIAGNOSIS — M899 Disorder of bone, unspecified: Secondary | ICD-10-CM

## 2013-08-13 DIAGNOSIS — E8809 Other disorders of plasma-protein metabolism, not elsewhere classified: Secondary | ICD-10-CM

## 2013-08-13 LAB — TSH: TSH: 4.4 u[IU]/mL (ref 0.35–5.50)

## 2013-08-13 LAB — LIPID PANEL
Total CHOL/HDL Ratio: 5
Triglycerides: 84 mg/dL (ref 0.0–149.0)

## 2013-08-13 MED ORDER — AMOXICILLIN-POT CLAVULANATE 875-125 MG PO TABS: 1.0000 | ORAL_TABLET | Freq: Two times a day (BID) | ORAL | Status: DC

## 2013-08-13 MED ORDER — METHYLPREDNISOLONE (PAK) 4 MG PO TABS: ORAL_TABLET | ORAL | Status: DC

## 2013-08-13 NOTE — Patient Instructions (Signed)
Today we updated your med list in our EPIC system...    Continue your current medications the same...    We refilled your meds per request...  For your SINUS>>    We wrote for AUGMENTIN 875mg  one tab twice daily til gone...    We also ordered a Medrol Dosepak to take as directed for the inflammation...    Use the OTC MUCINEX- 600mg  tabs- 2 twice daily w/ fluids...    And the NASAL SALINE mist...  Today we did your follow up Lipid profile & the full thyroid panel per [name]'s request...    We will contact you w/ the results when available...   Call for any questions...  Let's plan a follow up visit in 67mo, sooner if needed for problems.Marland KitchenMarland Kitchen

## 2013-08-13 NOTE — Progress Notes (Signed)
Subjective:    Patient ID: [name], female    DOB: August 15, 1946, 67 y.o.   MRN: [id number]  HPI 67 y/o WF here for a follow up visit... she has mult med problems and is well known to [place]... also followed in the Lipid Clinic, and by numerous specialists as noted below...   ~  August 03, 2012:  28mo ROV & [name] continues to have a very difficult time w/ her FM, chronic pain syndrome, etc; she is going to contact DrDeveshwar for a follow up rheum eval....    HxOSA, AR, Sinusitis> c/o recent sore throat/ URI treated w/ Keflex, MMW, Align & finally better...    HBP, CP, Palpit> on Metop25-1/2Qam; BP=140/82 today & 120s/60s at home; she denies recent CP, palpit, ch in SOB, edema, etc...    Chol> on Simva40-1/2daily & Niacin500; followed in the LC- last FLP 12/12 showed TChol 113, TG 87, HDL 36, LDL 60    Hypothyroid> on [id number]; TP started med 10/13 when TSH=5.93; pt notes she's not feeling any better; we discussed recheck TSH on return...    GI> GERD, Divertics, Polyps> on Prilosec20, Phen25 prn, AnusolHC; she denies abd pain, notes some intermit nausea, no d/c/ blood seen...    GU> IC> prev treatment w/ DMSO; states voiding OK, denies pain etc...    DJD, LBP, FM, Chr Pain Syndrome> followed by [name] on Oxycodone 20Qid prn, Mobic 7.5/d; and DrIbazedo- seen 9/13 w/ shot in back (helped)...    Osteopenia> on Ca, MVI, VitD; last BMD 12/12 showed TScore +0.7 in Spine, and -1.2 in right FemNeck...    Anxiety> on Alprazolam 0.5mg Tid prn...    MGUS> followed by [name] for IgM monoclonal gammopathy- seen 10/13 & labs w/o signif change x Fe sl low (pt rec to incr Fe in diet she says)... We reviewed prob list, meds, xrays and labs> see below for updates >> she had the 2013 Flu vaccine 11/15... LABS 10/13:  Chems- ok w/ BS=115;  CBC- wnl;  Fe=36 (sat11%);  Ferritin=37; +Monoclonal IgM kappa paraprotein (no change in levels)...  ~  November 09, 2012:  59mo ROV & add-on s/p mult visits- 2 w/  TP, 2 ER visits, Ortho, Rheum, Neuro, & Pain management...     She had f/u eval by DrFontaine, GYN 12/13> c/o disch, neg wet prep, exam w/ atrophic changes, s/p hyst, mammogram 8/13 was neg, BMD 8/12 w/ TScore -1.2 & f/u in 2014 planned...    She had f/o Ortho, DrBassett & Rheum, DrDeveshwar 12/13> active FM w/ pain & pos trigger points, chr fatigue, severe pain, continued right knee pain despite TKR, LBP, SI joint pain, etc; they checked lab panel & [facility]...     She saw TP 1/14 c/o jittery & nervous- she believed due to starting low dose Synthroid for TSH=5.92 in Oct2013; f/u TSH returned 2.42 on the med- continue same...    She went to the ER w/ confusion & memory loss> ER eval was neg- Exam, CXR, CTBrain, Labs, etc; husb reported that she drank 50 cans of coconut water over 1week- & told to decrease this; followed up in the office w/ TP & felt to be depressed- Rec trial Cymbalta30 & referred to Neuro for further eval...    Neuro told her that her memory was OK (note pending), & they changed Cymbalta to Celexa20 ("I'm allergic to the Cymbalta")    She is very jittery & tearful> says she doesn't want to  leave the house, not exercising, etc; I wonder about Psyche- agoraphobia vs PTSD & suggest referral for eval &/or counseling (she will think about it)... We reviewed prob list, meds, xrays and labs> see below for updates >>  LABS 1/14:  Chems- wnl x BS=112;  CBC- wnl;  TSH=0.52 & FreeT4=1.18;  UA- clear   ~  March 13, 2013:  3mo ROV & [name] is followed in the pain clinic by [name]- states she is allergic to [facility] & therefore she can't use the patch; notes occas jerking & told this was poss a side effect of her oxycodone;  She remains under the care of Rheum, Ortho, Pain management> doing exercise & PT- feels she is stable overall... We reviewed the following medical problems during today's office visit >>     HxOSA, AR, Sinusitis> no recent infections; prev  sleep eval 2010 w/ poor tol for CPAP, improved symptoms when she lost weight & denies prob w/ snoring, daytime hypersom, etc...    HBP, CP, Palpit> on Metop25-1/2Qam; BP=120/90 today & 120s/60s at home; she denies recent CP, palpit, ch in SOB, edema, etc...    Chol> on Simva40-1/2daily & Niacin500; prev followed in the LC- last FLP 12/12 showed TChol 113, TG 87, HDL 36, LDL 60; she needs to ret fasting for f/u blood work.    Hypothyroid> on Synthroid50-1/2 tab; TP started 44mcg/d 10/13 when TSH=5.93; pt feels bad all the time (told it's NOT her thyroid); f/u TSH 1/14 = 0.52 & dose decr to 1/2 tab daily...    GI- GERD, Divertics, Polyps> on Prilosec20, Phen25 prn, AnusolHC; she denies abd pain, notes some intermit nausea, no d/c/ blood seen...    GU- IC> prev treatment w/ DMSO; states voiding OK, denies pain etc...    DJD, LBP, FM, Chr Pain Syndrome> followed by Drs. Deveshwar, Effie Shy on Oxycodone 20Qid prn, Mobic 7.5/d; DrIbazedo- seen 9/13 w/ shot in back (helped); see notes below...    Osteopenia> on Ca, MVI, VitD; last BMD 12/12 showed TScore +0.7 in Spine, and -1.2 in right FemNeck...    Anxiety> on Alprazolam 0.5mg Tid prn & Celexa20 added- this has helped her depression, less agoraphobia & PTSD symptoms, "doing much better" she says...     MGUS> followed by [name] for IgM monoclonal gammopathy- seen 10/13 & labs w/o signif change x Fe sl low (pt rec to incr Fe in diet she says)... We reviewed prob list, meds, xrays and labs> see below for updates >>   ~  August 13, 2013:  2mo ROV & [name] is c/o "I got sinus" w/ 1 wk hx left ear discomfort- bubbling & popping, dizzy, ans sore eyeballs and cheeks she says; also notes HA, congestion, etc; she took a few amox she had at home; We discussed Rx w/ Augmentin & Medrol dosepak + Mucinex600-2Bid, fluids, Nasal saline, etc...    BP remains controlled on Metop12.5Qam w/ BP= 122/70 & she denies CP, palpit, SOB, edema, etc...    Chol is not at  goals on her Simva40-1/2Qd + Niasp500; FLP 11/14 showed TChol 240, TG 84, HDL 45, LDL 188 and she is rec to incr the simva to 40mg  Qhs...    Thyroid is regulated w/ Synthroid80mcg-1/2 tab/d; she says [name] wants a full thyroid panel- TSH=4.40;  FreeT3=2.5 (2.3-4.5);  FreeT4=1.03 (0.60-1.60)...    GI & GU are stable...    Ortho & Rheum followed by Drs Corliss Skains, Effie Shy on Mobic7.5, Celexa10, and Oxycodone10Q4h prn...    Anxiety managed w/ Xanax  0.5mg  prn...     MGUS followed by [name] & labs reviewed in Epic... We reviewed prob list, meds, xrays and labs> see below for updates >>  LABS 11/14:  FLP- not at goals on Simva20+Niasp500;  TSH=4.40;  FreeT3=2.5;  FreeT4=1.03 (full thyroid panel requested by [name] per pt)...             Problem List:  UNSPECIFIED SLEEP APNEA (ICD-780.57) - Complex sleep apnea - PSG 07/25/09 AHI 28 > ALLERGIC RHINITIS (ICD-477.9) Hx of SINUSITIS (ICD-473.9) - Hx sinusitis followed by [name] at [facility]  ~  Sleep eval [name] w/ 11/10 study showing AHI= 25 w/ obstructive & central events... CPAP titration was suboptimal & she was tried on BiPAP... she tells me that she stopped all this when she lost weight & husb confirms no snoring or sleep disordered breathing- rests well etc... Denies daytime sleepiness or issues...  HYPERTENSION (ICD-401.9) >>  ~  on METOPROLOL 25mg  Qam; she was hosp 3/09 w/ gastroenteritis & BP meds were stopped due to weakness; grad restarted w/ improvement in BP & palpit... ~  11/11: BP= 122/70 she is INTOL to Diovan; denies HA, visual symptoms, CP, palpit, dizzy, syncope, edema; husb notes that BPs are similar at home ~120/70's... ~  5/12:  BP= 112/70 & she has the same chr fatigue complaints, but no specific symptoms... ~  11/12:  BP=130/80 & she has the same chronic complaints but denies CP, syncope, ch in SOB, edema; [facility]; she wants to decr the Metoprolol to 25mg  daily- ok... ~  CXR 11/12 showed clear  lungs, normal heart size, mild DJD TSpine... ~  5/13:  BP= 142/80 & she persists w/ mult chronic complaints... ~  11/13: on Metop25-1/2Qam; BP=140/82 today & 120s/60s at home; she denies recent CP, palpit, ch in SOB, edema, etc. ~  2/14:  BP= 142/82 & she appears unchanged... ~  6/14: on Metop25-1/2Qam; BP=120/90 today & 120s/60s at home; she denies recent CP, palpit, ch in SOB, edema, etc. ~  11/14: BP remains controlled on Metop12.5Qam w/ BP= 122/70 & she denies CP, palpit, SOB, edema, etc  CHEST PAIN (ICD-786.50) - seen by [name] in 2006... ~  2DEcho 11/98 showed norm valves, norm LVF, ? min LVH... ~  cath 7/03 w/ norm coronaries, & norm LVF... ~  NuclearStressTest 2/06 showed no infarct, no ischemia, EF=65%... ~  Myoview repeated 4/10: low risk study w/ soft tissue attenuation, normal wall motion, EF= 78%... ~  EKG 11/12 showed NSR, WNL.[name]. ~  CXR 1/14 via ER showed normal heart size, clear, NAD... ~  EKG 1/14 showed NSR, rate66, borderline IVCD, otherw wnl, NAD...  PALPITATIONS, HX OF (ICD-V12.50) - notes occas fluttering and "jello feeling" in her chest... Metoprolol helps.  HYPERCHOLESTEROLEMIA (ICD-272.0) - on ZOCOR 40mg Qhs + Fish Oil  + OTC Niacin> followed in the LC. ~  FLP 01/03/08 on SIMVASTATIN 20mg /d showed TChol 206, TG 182, HDL 31, LDL 145... ~  FLP 6/09 (on Simva40) showed TChol 173, TG 199, HDL 31, LDL 102... continue f/u in lipid clinic. ~  FLP 11/09 (on Simva40) showed TChol 196, TG 265, HDL 32, LDL 119... she stopped lipid clinic. ~  Due to continued "side effects" she placed the Simvastatin on hold, then restarted 40mg /d... ~  FLP 6/10 showed TChol 218, TG 394, HDL 33, LDL 129...  ~  FLP 11/10 showed TChol 262, TG 568, HDL 31, LDL 144... she needs to ret to the Lipid Clinic. ~  FLP 1/11  after 20# wt loss showed TChol 144, TG 181, HDL 43, LDL 65 on Simva40 ~  FLP 10/11 on Simva40+FishOil showed TChol 145, TG 237, HDL 33, LDL 79 ~  FLP 2/12 on Simva40+FishOil showed  TChol 126, TG 238, HDL 27, LDL 66 ~  FLP 6/12 in EPIC per LC showed TChol 160, TG 226, HDL 31, LDL 92... Continue meds, low fat diet, exercise. ~  FLP 12/12 by LC on Simva40+Niasp500 showed TChol 113, TG 87, HDL 36, LDL 60 ~  She continues regular f/u in Lipid Clinic => needs to ret for FLP here ~  FLP 11/14 on Simva20+Niasp500 showed TChol 240, TG 84, HDL 45, LDL 188... Rec to incr Simva40...  GERD (ICD-530.81) - on PRILOSEC 20mg /d, and Phenergan as needed... ~  prev EGD 8/05 showed 3cmHH, gastric polyp... prev EGD w/ stricture dilated. ~  EGD repeated 12/09 & showed 5cm HH w/ healing ulcer, esophagitis, gastritis, etc... HPylori neg... REC> continue Prilosec, Phenergan. ~  she saw [name], [name] 8/10 for eval of nausea- no help from Reglan, Scopalamine, etc... had UGI/ Ba swallow- sm HH, mild reflux, norm peristalsis;  Gastric Emptying Scan- normal;  EGD 9/10 by [facility] w/ esoph ulcer seen, neg CLO, benign gastric polyp removed, Rx'd PPI. ~  5/12:  She states that she no longer has any GI issues on her vegan diet... ~  11/12:  She notes that Gabriel Rung gives her juiced carrots and spinach daily...  DIVERTICULOSIS OF COLON (ICD-562.10) - Rx w/ PROCTOSOL HC Cream Prn, and stool softeners... COLONIC POLYPS (ICD-211.3) - colonoscopy 8/07 by [name] showed divertics only...  CYSTITIS (ICD-595.9) - she was seen at [facility] for poss interstitial cystitis... prev DMSO Rx. ~  Eval by [name] & pt tells me she is on Elmiron both orally & via bladder (self-cath). ~  9/11:  she reports IC is quiet & off Elmiron Rx now. ~  11/12:  She confirms min symptoms at present...  DEGENERATIVE JOINT DISEASE (ICD-715.90) - prev Rx MOBIC 7.5mg  Prn, now on OXYCODONE- taking daily for her pain per [name]... she had a right TKR 12/07 by [name]... then required right Patella surg 10/08 from [name]... now s/p right TKR revision by DrJinna at [facility]- they referred to pain management... ~  9/11: she tells  me that [name] is referring her to [facility] for another opinion regarding her knees ([name]- there was nothing they could do & rec Pain Management[facility], & veggie diet). ~  11/12:  She reports left knee bone-on-bone w/ shot from [facility] 10/12 which helped; still followed by Pain Management & she tells me she's taking Percocet 10mg  every 4H (ave 3-4 per day). ~  11/13:  She continues to follow up w/ [name] pain clinic & reports taking Oxycod 20mg  3-4 tabs daily, plus Mobic7.5.Marland KitchenMarland Kitchen ~  12/13: she had f/u Ortho DrBassett at [facility] for leg swelling- neg VenDopplers w/o DVT & rec to avoid sodium, elev legs, wear support hose; given HC cream for dermatitis... ~  2/14: she saw DrVoytek & was given shots in her right hip bursa & left knee...   BACK PAIN, LUMBAR (ICD-724.2) - she had a right L4-5 decompressive laminotomy w/ foraminotomy 12/02 by DrPool... then L4-5 decompression & fusion 9/09 by DrPool due to DDD w/ synovial cyst & degen spondylolithesis w/ stenosis... f/u XRays and MRI 5/10 showed fusion intact, NAD.[name]. ~  CHRONIC PAIN MANAGEMENT> followed by [name] clinic now, on OXYCODONE 10-20mg  every 4H prn (ave 3-4/d she  says), plus MOBIC 7.5mg /d. ~  9/13:  She reports that she had ESI from DrIbazedo, w/ some improvement...  FIBROMYALGIA (ICD-729.1) - she continues to have mult somatic complaints... she has seen DrDeveshwar in the past...  ~  She reports trial of Accupuncture> it helped the FM but not the knee pain. ~  husb has inquired about periph nerve surg he researched on the internet, he is referred to her specialists to discuss options... ~  11/13:  They indicate that they have a f/u appt w/ DrDeveshwar for Rheum... ~  12/13:  She had Rheum eval by DrDeveshwar> active FM w/ pain & pos trigger points, chr fatigue, severe pain, continued right knee pain despite TKR, LBP, SI joint pain, etc; they checked lab panel & rec Neurontin trial & [facility].  OSTEOPENIA (ICD-733.90) - on calcium, MVI, Vit D... Followed by Gyn- DrFontaine. VITAMIN D DEFICIENCY (ICD-268.9) -  ~  Vit D level was 5 (30-90) 11/08 & rec to start VitD 50K/wk... ~  Vit D level 11/09 = 45  ~  Vit D level 6/10 = 23 ~  Vit D level 11/10 = 35 ~  Vit D level 5/11 = 70... switched to Vit D 2000u daily. ~  BMD 12/12 by DrFontaine showed lowest measured site= right FemNeck -1.2  MEMORY LOSS >> ~  1/14: she was eval by DrAthar at [facility]> MMSE was 30/30 but he noted her mult diagnoses, pain meds, & depression; they rec neuropsyche testing & brain MRI but she wanted to wait...  ~  CT Brain 1/14 showed mild global atrophy, sm pineal lipoma, opac right max sinus, mucosal thickening in the sphenoids, NAD...   ANXIETY (ICD-300.00) - on ALPRAZOLAM 0.5mg  as needed- taking 3 per day recently (she notes mild tremor & this helps). DEPRESSION (ICD-311) ~  CT Brain 1/14 via ER showed a sm pineal lipoma, mild global atrophy, right max sinus opacification & mucosal thickening, NAD... ~  2/14: She is very jittery & tearful> says she doesn't want to leave the house, not exercising, etc; I wonder about Psyche- agoraphobia vs PTSD & suggest referral for eval &/or counseling=> she preferred meds, try Celexa20... ~  6/14: on Alprazolam 0.5mg Tid prn & Celexa20 added- this has helped her depression, less agoraphobia & PTSD symptoms, "doing much better" she says...  Hx of PSORIASIS (ICD-696.1)   Hx of UNSPECIFIED DISORDER PLASMA PROTEIN METABOLISM (ICD-273.9) - she has a biclonal gammopathy evaluated 10/05 by [name] who thought it was reactive, perhaps a MGUS variant... ~  f/u labs 11/09 showed Hg= 12.2, TProt= 7.1, SPE/ IEP w/ biclonal IgM kappa protein present accounting for 0.31g/dl of the total 9.56 g/dl of protein in the gamma region & IgM level = 319 mg/dl... ~  labs 11/10 showed Hg= 12.5, TProt= 7.8, SPEP/IEP w/ monoclonal IgM kappa & IgM lamba proteins accounting for 0.44 g/dl of  the total 2.13 g/dl in the gamma region... IgM level = 490mg /dl... ~  11/10:  Referred back to [name] who has resumed follow up of this problem... ~  4/11:  [name] did CT Chest/ Abd/ Pelvis> neg w/o adenopathy etc... ~  2012> she was given IV iron by [name] & reports feeling some better... ~  11/12:  Labs here showed Hg= 13.7, Fe= 61 (18%sat) ~  Labs from [employer] (once yearly in the fall) reviewed in EPIC flow sheet ==> IgM kappa monoclonal gammopathy w/ stable parameters.   Past Surgical History  Procedure Laterality Date  .  Laminotomy  2010    L-4, L-5 FUSION  . Knee arthroscopy      RIGHT KNEE X 5  . Total knee arthroplasty  12-07 and 4-07    Dr. Shelle Iron, revision 12-09 Dr. Eileen Stanford at [facility]  . Patella fracture surgery  10-08    Dr. Charlann Boxer  . Posterior laminectomy / decompression lumbar spine  9-09    Dr. Jordan Likes  . Cholecystectomy    . Hand surgery      TRIGGER FINGER  . Hernia repair    . Laparoscopic oophorectomy  2010    BILATERAL  . Abdominal hysterectomy      for prolapse with abdominal incision  . Oophorectomy      BSO    Outpatient Encounter Prescriptions as of 08/13/2013  Medication Sig  . ALPRAZolam (XANAX) 0.5 MG tablet TAKE ONE-HALF TO ONE TABLET BY MOUTH THREE TIMES DAILY AS NEEDED  . amoxicillin-clavulanate (AUGMENTIN) 875-125 MG per tablet Take 1 tablet by mouth 2 (two) times daily.  . Cholecalciferol (VITAMIN D) 2000 UNITS CAPS Take 2 capsules by mouth daily.   . citalopram (CELEXA) 20 MG tablet Take 10 mg by mouth daily.   . Diphenhyd-Hydrocort-Nystatin (FIRST-DUKES MOUTHWASH) SUSP Take 1 tsp gargle and swallow four times daily as needed.  . fish oil-omega-3 fatty acids 1000 MG capsule Take 2 g by mouth daily.   . hydrocortisone 2.5 % cream Apply 1 application topically as needed. RECTAL ITCHING  . levothyroxine (SYNTHROID, LEVOTHROID) 50 MCG tablet 1/2 tab by mouth once daily  . meloxicam (MOBIC) 7.5 MG tablet Take 15 mg by mouth daily. Take 2 tablets by  mouth daily  . metoprolol tartrate (LOPRESSOR) 25 MG tablet Take 12.5 mg by mouth every morning.   . Multiple Vitamins-Iron (MULTIVITAMIN/IRON PO) Take 1 tablet by mouth daily.   . niacin (NIASPAN) 500 MG CR tablet Take 1 tablet (500 mg total) by mouth at bedtime.  . Oxycodone HCl 10 MG TABS Take 10 mg by mouth every 4 (four) hours.  . simvastatin (ZOCOR) 40 MG tablet Take 1/2 tablet by mouth at bedtime  . [DISCONTINUED] omeprazole (PRILOSEC) 20 MG capsule Take 20 mg by mouth every other day.    . [DISCONTINUED] OxyCODONE (OXYCONTIN) 10 mg T12A 12 hr tablet Take 10 mg by mouth every 12 (twelve) hours.    Allergies  Allergen Reactions  . Cymbalta [Duloxetine Hcl]     Facial edema  . Mepergan [Meperidine-Promethazine]     Cardiac arrest  . Iohexol      Code: HIVES, Desc: pt states her face and throat swells when administered IV contrast - KB, Onset Date: [id number]   . Valsartan     REACTION: pt states INTOL to Diovan    Current Medications, Allergies, Past Medical History, Past Surgical History, Family History, and Social History were reviewed in [employer] record.    Review of Systems         See HPI - all other systems neg except as noted... The patient complains of dyspnea on exertion, muscle weakness, and difficulty walking.  The patient denies anorexia, fever, weight loss, weight gain, vision loss, decreased hearing, hoarseness, chest pain, syncope, peripheral edema, prolonged cough, headaches, hemoptysis, abdominal pain, melena, hematochezia, severe indigestion/heartburn, hematuria, incontinence, suspicious skin lesions, transient blindness, depression, unusual weight change, abnormal bleeding, enlarged lymph nodes, and angioedema.     Objective:   Physical Exam     WD, WN, 67 y/o WF in NAD... GENERAL:  Alert & oriented;  pleasant & cooperative... HEENT:  [place]/AT, EOM-wnl, PERRLA, EACs-clear, TMs-wnl, NOSE-clear, THROAT-clear & wnl. NECK:  Supple w/  fairROM; no JVD; normal carotid impulses w/o bruits; no thyromegaly or nodules palpated; no lymphadenopathy. CHEST:  Clear to P & A; without wheezes/ rales/ or rhonchi. HEART:  Regular Rhythm; without murmurs/ rubs/ or gallops. ABDOMEN:  Soft & nontender; normal bowel sounds; no organomegaly or masses detected. EXT: s/p right TKR, mod arthritic changes; no varicose veins/ +venous insuffic/ tr edema. BACK:  scar of surgery, +trigger points... NEURO:  CN's intact;  no focal neuro deficits... DERM:  tiny red spot upper inner left arm bicep area from prev tick bite- no tick remnants... dry skin, no signif rash noted.  RADIOLOGY DATA:  Reviewed in the EPIC EMR & discussed w/ the patient...  LABORATORY DATA:  Reviewed in the EPIC EMR & discussed w/ the patient...   Assessment & Plan:    HBP>  BP controlled on simple med + diet & exercise; we decided to keep the Metoprolol25mg - 1/2 tab...  CHOL>  Prev followed in Lipid Clinic- FLP is off w/ LDL=188 on Simva20 & rec to incr to Simva40, reviewed diet etc...  GI>  She denies GI issues at present on her vegan diet...  GU>  Similarly the prev IC is improved & hasn't been on meds (Elmiron) for many months...  DJD/ FM/ Chr Pain>  She is followed in the Pain management clinic & Rheum as noted above, & she had [facility]- improved; & shot in back from [employer] [facility].  MGUS>  Followed by [name], we do not have recent notes but she reports stable & he gave her some IV Fe previously & she felt sl better after this Rx.  ANXIETY/ DEPRESSION> on Alpraz prn & Celexa20- improved & she credits her sister for help getting out some...   Patient's Medications  New Prescriptions   METHYLPREDNISOLONE (MEDROL DOSPACK) 4 MG TABLET    follow package directions  Previous Medications   ALPRAZOLAM (XANAX) 0.5 MG TABLET    TAKE ONE-HALF TO ONE TABLET BY MOUTH THREE TIMES DAILY AS NEEDED   CHOLECALCIFEROL (VITAMIN D) 2000 UNITS CAPS    Take 2 capsules  by mouth daily.    CITALOPRAM (CELEXA) 20 MG TABLET    Take 10 mg by mouth daily.    DIPHENHYD-HYDROCORT-NYSTATIN (FIRST-DUKES MOUTHWASH) SUSP    Take 1 tsp gargle and swallow four times daily as needed.   FISH OIL-OMEGA-3 FATTY ACIDS 1000 MG CAPSULE    Take 2 g by mouth daily.    HYDROCORTISONE 2.5 % CREAM    Apply 1 application topically as needed. RECTAL ITCHING   LEVOTHYROXINE (SYNTHROID, LEVOTHROID) 50 MCG TABLET    1/2 tab by mouth once daily   MELOXICAM (MOBIC) 7.5 MG TABLET    Take 15 mg by mouth daily. Take 2 tablets by mouth daily   METOPROLOL TARTRATE (LOPRESSOR) 25 MG TABLET    Take 12.5 mg by mouth every morning.    MULTIPLE VITAMINS-IRON (MULTIVITAMIN/IRON PO)    Take 1 tablet by mouth daily.    NIACIN (NIASPAN) 500 MG CR TABLET    Take 1 tablet (500 mg total) by mouth at bedtime.   OXYCODONE HCL 10 MG TABS    Take 10 mg by mouth every 4 (four) hours.  Modified Medications   Modified Medication Previous Medication   AMOXICILLIN-CLAVULANATE (AUGMENTIN) 875-125 MG PER TABLET amoxicillin-clavulanate (AUGMENTIN) 875-125 MG per tablet      Take 1 tablet  by mouth 2 (two) times daily.    Take 1 tablet by mouth 2 (two) times daily.   SIMVASTATIN (ZOCOR) 40 MG TABLET simvastatin (ZOCOR) 40 MG tablet      Take 40 mg by mouth at bedtime.    Take 1 tablet (40 mg total) by mouth at bedtime. Take 1/2 tablet by mouth at bedtime  Discontinued Medications   OMEPRAZOLE (PRILOSEC) 20 MG CAPSULE    Take 20 mg by mouth every other day.     OXYCODONE (OXYCONTIN) 10 MG T12A 12 HR TABLET    Take 10 mg by mouth every 12 (twelve) hours.   SIMVASTATIN (ZOCOR) 40 MG TABLET    Take 1/2 tablet by mouth at bedtime

## 2013-08-14 ENCOUNTER — Other Ambulatory Visit: Payer: Self-pay | Admitting: Pulmonary Disease

## 2013-08-14 MED ORDER — SIMVASTATIN 40 MG PO TABS: 40.0000 mg | ORAL_TABLET | Freq: Every day | ORAL | Status: DC

## 2013-09-07 DIAGNOSIS — G894 Chronic pain syndrome: Secondary | ICD-10-CM | POA: Diagnosis not present

## 2013-09-07 DIAGNOSIS — F329 Major depressive disorder, single episode, unspecified: Secondary | ICD-10-CM | POA: Diagnosis not present

## 2013-09-07 DIAGNOSIS — M961 Postlaminectomy syndrome, not elsewhere classified: Secondary | ICD-10-CM | POA: Diagnosis not present

## 2013-09-07 DIAGNOSIS — M1710 Unilateral primary osteoarthritis, unspecified knee: Secondary | ICD-10-CM | POA: Diagnosis not present

## 2013-09-18 ENCOUNTER — Encounter: Payer: Self-pay | Admitting: Gynecology

## 2013-09-18 DIAGNOSIS — R030 Elevated blood-pressure reading, without diagnosis of hypertension: Secondary | ICD-10-CM | POA: Diagnosis not present

## 2013-09-18 DIAGNOSIS — J019 Acute sinusitis, unspecified: Secondary | ICD-10-CM | POA: Diagnosis not present

## 2013-09-18 DIAGNOSIS — Z23 Encounter for immunization: Secondary | ICD-10-CM | POA: Diagnosis not present

## 2013-09-20 DIAGNOSIS — N182 Chronic kidney disease, stage 2 (mild): Secondary | ICD-10-CM

## 2013-09-20 HISTORY — DX: Chronic kidney disease, stage 2 (mild): N18.2

## 2013-09-27 ENCOUNTER — Telehealth: Payer: Self-pay | Admitting: Pulmonary Disease

## 2013-09-27 NOTE — Telephone Encounter (Signed)
Spoke with pt. She wanted to schedule an appt with SN next week. i advised her he was off all week but could get her in with TP. She scheduled an appt with TP. Nothing further needed

## 2013-09-29 ENCOUNTER — Emergency Department (HOSPITAL_BASED_OUTPATIENT_CLINIC_OR_DEPARTMENT_OTHER): Payer: Medicare Other

## 2013-09-29 ENCOUNTER — Encounter (HOSPITAL_BASED_OUTPATIENT_CLINIC_OR_DEPARTMENT_OTHER): Payer: Self-pay | Admitting: Emergency Medicine

## 2013-09-29 ENCOUNTER — Emergency Department (HOSPITAL_BASED_OUTPATIENT_CLINIC_OR_DEPARTMENT_OTHER)
Admission: EM | Admit: 2013-09-29 | Discharge: 2013-09-29 | Disposition: A | Discharge status: Home / Self Care | Payer: Medicare Other | Attending: Emergency Medicine | Admitting: Emergency Medicine

## 2013-09-29 DIAGNOSIS — I10 Essential (primary) hypertension: Secondary | ICD-10-CM | POA: Insufficient documentation

## 2013-09-29 DIAGNOSIS — M1990 Unspecified osteoarthritis, unspecified site: Secondary | ICD-10-CM | POA: Diagnosis not present

## 2013-09-29 DIAGNOSIS — G8929 Other chronic pain: Secondary | ICD-10-CM | POA: Diagnosis not present

## 2013-09-29 DIAGNOSIS — R5381 Other malaise: Secondary | ICD-10-CM | POA: Diagnosis not present

## 2013-09-29 DIAGNOSIS — Z862 Personal history of diseases of the blood and blood-forming organs and certain disorders involving the immune mechanism: Secondary | ICD-10-CM | POA: Insufficient documentation

## 2013-09-29 DIAGNOSIS — Z872 Personal history of diseases of the skin and subcutaneous tissue: Secondary | ICD-10-CM | POA: Diagnosis not present

## 2013-09-29 DIAGNOSIS — F411 Generalized anxiety disorder: Secondary | ICD-10-CM | POA: Insufficient documentation

## 2013-09-29 DIAGNOSIS — Z791 Long term (current) use of non-steroidal anti-inflammatories (NSAID): Secondary | ICD-10-CM | POA: Insufficient documentation

## 2013-09-29 DIAGNOSIS — R5383 Other fatigue: Secondary | ICD-10-CM | POA: Diagnosis not present

## 2013-09-29 DIAGNOSIS — R52 Pain, unspecified: Secondary | ICD-10-CM | POA: Insufficient documentation

## 2013-09-29 DIAGNOSIS — F329 Major depressive disorder, single episode, unspecified: Secondary | ICD-10-CM | POA: Diagnosis not present

## 2013-09-29 DIAGNOSIS — R059 Cough, unspecified: Secondary | ICD-10-CM | POA: Insufficient documentation

## 2013-09-29 DIAGNOSIS — E559 Vitamin D deficiency, unspecified: Secondary | ICD-10-CM | POA: Diagnosis not present

## 2013-09-29 DIAGNOSIS — E7800 Pure hypercholesterolemia, unspecified: Secondary | ICD-10-CM | POA: Insufficient documentation

## 2013-09-29 DIAGNOSIS — R0602 Shortness of breath: Secondary | ICD-10-CM | POA: Diagnosis not present

## 2013-09-29 DIAGNOSIS — Z87448 Personal history of other diseases of urinary system: Secondary | ICD-10-CM | POA: Insufficient documentation

## 2013-09-29 DIAGNOSIS — Z792 Long term (current) use of antibiotics: Secondary | ICD-10-CM | POA: Insufficient documentation

## 2013-09-29 DIAGNOSIS — Z8719 Personal history of other diseases of the digestive system: Secondary | ICD-10-CM | POA: Insufficient documentation

## 2013-09-29 DIAGNOSIS — F3289 Other specified depressive episodes: Secondary | ICD-10-CM | POA: Insufficient documentation

## 2013-09-29 DIAGNOSIS — Z860100 Personal history of colon polyps, unspecified: Secondary | ICD-10-CM | POA: Insufficient documentation

## 2013-09-29 DIAGNOSIS — R05 Cough: Secondary | ICD-10-CM | POA: Diagnosis not present

## 2013-09-29 DIAGNOSIS — IMO0001 Reserved for inherently not codable concepts without codable children: Secondary | ICD-10-CM | POA: Diagnosis not present

## 2013-09-29 DIAGNOSIS — R635 Abnormal weight gain: Secondary | ICD-10-CM | POA: Insufficient documentation

## 2013-09-29 DIAGNOSIS — Z79899 Other long term (current) drug therapy: Secondary | ICD-10-CM | POA: Diagnosis not present

## 2013-09-29 DIAGNOSIS — M25569 Pain in unspecified knee: Secondary | ICD-10-CM | POA: Insufficient documentation

## 2013-09-29 DIAGNOSIS — E079 Disorder of thyroid, unspecified: Secondary | ICD-10-CM | POA: Diagnosis not present

## 2013-09-29 DIAGNOSIS — R079 Chest pain, unspecified: Secondary | ICD-10-CM | POA: Diagnosis not present

## 2013-09-29 DIAGNOSIS — R42 Dizziness and giddiness: Secondary | ICD-10-CM | POA: Insufficient documentation

## 2013-09-29 LAB — CBC WITH DIFFERENTIAL/PLATELET
BASOS ABS: 0 10*3/uL (ref 0.0–0.1)
Basophils Relative: 0 % (ref 0–1)
EOS PCT: 7 % — AB (ref 0–5)
Eosinophils Absolute: 0.6 10*3/uL (ref 0.0–0.7)
HEMATOCRIT: 40.6 % (ref 36.0–46.0)
Hemoglobin: 13.3 g/dL (ref 12.0–15.0)
LYMPHS PCT: 26 % (ref 12–46)
Lymphs Abs: 2.1 10*3/uL (ref 0.7–4.0)
MCH: 28.2 pg (ref 26.0–34.0)
MCHC: 32.8 g/dL (ref 30.0–36.0)
MCV: 86.2 fL (ref 78.0–100.0)
Monocytes Absolute: 0.4 10*3/uL (ref 0.1–1.0)
Monocytes Relative: 5 % (ref 3–12)
NEUTROS ABS: 5 10*3/uL (ref 1.7–7.7)
Neutrophils Relative %: 62 % (ref 43–77)
PLATELETS: 228 10*3/uL (ref 150–400)
RBC: 4.71 MIL/uL (ref 3.87–5.11)
RDW: 12.6 % (ref 11.5–15.5)
WBC: 8.1 10*3/uL (ref 4.0–10.5)

## 2013-09-29 LAB — BASIC METABOLIC PANEL
BUN: 9 mg/dL (ref 6–23)
CHLORIDE: 103 meq/L (ref 96–112)
CO2: 29 mEq/L (ref 19–32)
CREATININE: 0.9 mg/dL (ref 0.50–1.10)
Calcium: 9.4 mg/dL (ref 8.4–10.5)
GFR, EST AFRICAN AMERICAN: 76 mL/min — AB (ref >90)
GFR, EST NON AFRICAN AMERICAN: 65 mL/min — AB (ref >90)
GLUCOSE: 115 mg/dL — AB (ref 70–99)
Potassium: 4.5 mEq/L (ref 3.7–5.3)
Sodium: 143 mEq/L (ref 137–147)

## 2013-09-29 LAB — TSH: TSH: 2.111 u[IU]/mL (ref 0.350–4.500)

## 2013-09-29 NOTE — ED Provider Notes (Signed)
CSN: [id number]     Arrival date & time 09/29/13  1255 History   First MD Initiated Contact with Patient 09/29/13 1430     Chief Complaint  Patient presents with  . Nasal Congestion  . Generalized Body Aches   (Consider location/radiation/quality/duration/timing/severity/associated sxs/prior Treatment) HPI Comments: 68 yo female with fibromyalgia, anemia, htn, chronic pain, narcotic use for right knee pain presents with congestion, chills, cough, general weakness and weight gain.  Pt has not had changes in narcotic dosing for months.  She has questionable subclinical thyroid hx.  Past few weeks pt has had 20 lbs wt gain without trying.  General fatigue.  No fevers.  Sxs constant.    The history is provided by the patient.    Past Medical History  Diagnosis Date  . Sleep apnea   . Allergic rhinitis   . HTN (hypertension)   . Chest pain   . Palpitations   . Hypercholesterolemia   . GERD (gastroesophageal reflux disease)   . Diverticulosis of colon   . History of colonic polyps   . Cystitis   . DJD (degenerative joint disease)   . Lumbar back pain   . Fibromyalgia   . Osteopenia 08/2011    DEXA t score -1.2 FRAX 13%/1.2%  . Vitamin D deficiency   . Anxiety   . Depression   . Psoriasis   . Unspecified disorder of plasma protein metabolism   . IC (interstitial cystitis)   . Thyroid disease   . Iron deficiency anemia, unspecified 06/27/2013   Past Surgical History  Procedure Laterality Date  . Laminotomy  2010    L-4, L-5 FUSION  . Knee arthroscopy      RIGHT KNEE X 5  . Total knee arthroplasty  12-07 and 4-07    [name], revision 12-09 [name] at [facility]  . Patella fracture surgery  10-08    [name]  . Posterior laminectomy / decompression lumbar spine  9-09    [name]  . Cholecystectomy    . Hand surgery      TRIGGER FINGER  . Hernia repair    . Laparoscopic oophorectomy  2010    BILATERAL  . Abdominal hysterectomy      for prolapse with abdominal incision  .  Oophorectomy      BSO   Family History  Problem Relation Age of Onset  . Coronary artery disease    . Lupus    . Hypertension Mother   . Hypertension Father   . Breast cancer Sister   . Hypertension Sister   . Leukemia Brother   . Lung disease Brother   . Cancer Brother     Melanoma   History  Substance Use Topics  . Smoking status: Never Smoker   . Smokeless tobacco: Never Used  . Alcohol Use: No   OB History   Grav Para Term Preterm Abortions TAB SAB Ect Mult Living   0              Review of Systems  Constitutional: Positive for fatigue. Negative for fever and chills.  HENT: Positive for congestion.   Eyes: Negative for visual disturbance.  Respiratory: Positive for cough.   Cardiovascular: Negative for chest pain.  Gastrointestinal: Negative for vomiting and abdominal pain.  Genitourinary: Negative for dysuria and flank pain.  Musculoskeletal: Negative for back pain, neck pain and neck stiffness.  Skin: Negative for rash.  Neurological: Positive for light-headedness. Negative for headaches.    Allergies  Cymbalta; Mepergan; Iohexol; and Valsartan  Home Medications   Current Outpatient Rx  Name  Route  Sig  Dispense  Refill  . ALPRAZolam (XANAX) 0.5 MG tablet      TAKE ONE-HALF TO ONE TABLET BY MOUTH THREE TIMES DAILY AS NEEDED   90 tablet   5   . amoxicillin-clavulanate (AUGMENTIN) 875-125 MG per tablet   Oral   Take 1 tablet by mouth 2 (two) times daily.   14 tablet   0   . Cholecalciferol (VITAMIN D) 2000 UNITS CAPS   Oral   Take 2 capsules by mouth daily.          . citalopram (CELEXA) 20 MG tablet   Oral   Take 10 mg by mouth daily.          . Diphenhyd-Hydrocort-Nystatin (FIRST-DUKES MOUTHWASH) SUSP      Take 1 tsp gargle and swallow four times daily as needed.   120 mL   5   . fish oil-omega-3 fatty acids 1000 MG capsule   Oral   Take 2 g by mouth daily.          . hydrocortisone 2.5 % cream   Topical   Apply 1 application  topically as needed. RECTAL ITCHING         . levothyroxine (SYNTHROID, LEVOTHROID) 50 MCG tablet      1/2 tab by mouth once daily   15 tablet   6   . meloxicam (MOBIC) 7.5 MG tablet   Oral   Take 15 mg by mouth daily. Take 2 tablets by mouth daily         . methylPREDNIsolone (MEDROL DOSPACK) 4 MG tablet      follow package directions   21 tablet   0   . metoprolol tartrate (LOPRESSOR) 25 MG tablet   Oral   Take 12.5 mg by mouth every morning.          . Multiple Vitamins-Iron (MULTIVITAMIN/IRON PO)   Oral   Take 1 tablet by mouth daily.          [name] EXPIRED: niacin (NIASPAN) 500 MG CR tablet   Oral   Take 1 tablet (500 mg total) by mouth at bedtime.   90 tablet   3   . Oxycodone HCl 10 MG TABS   Oral   Take 10 mg by mouth every 4 (four) hours.         . simvastatin (ZOCOR) 40 MG tablet   Oral   Take 40 mg by mouth at bedtime.          BP 142/84  Pulse 67  Temp(Src) 98.3 F (36.8 C) (Oral)  Resp 18  SpO2 97% Physical Exam  Nursing note and vitals reviewed. Constitutional: She is oriented to person, place, and time. She appears well-developed and well-nourished.  HENT:  Head: Normocephalic and atraumatic.  Mild dry mm  Eyes: Conjunctivae are normal. Right eye exhibits no discharge. Left eye exhibits no discharge.  Neck: Normal range of motion. Neck supple. No tracheal deviation present.  Cardiovascular: Normal rate and regular rhythm.   Pulmonary/Chest: Effort normal and breath sounds normal.  Abdominal: Soft. She exhibits no distension. There is no tenderness. There is no guarding.  Musculoskeletal: She exhibits no edema.  Neurological: She is alert and oriented to person, place, and time.  Skin: Skin is warm. No rash noted.  Psychiatric: She has a normal mood and affect.    ED Course  Procedures (including critical care  time) Labs Review Labs Reviewed  CBC WITH DIFFERENTIAL - Abnormal; Notable for the following:    Eosinophils Relative  7 (*)    All other components within normal limits  TSH  BASIC METABOLIC PANEL   Imaging Review Dg Chest 2 View  09/29/2013   CLINICAL DATA:  68 year old female with shortness of breath, fever and chest pain.  EXAM: CHEST  2 VIEW  COMPARISON:  10/02/2012  FINDINGS: The cardiomediastinal silhouette is unremarkable.  There is no evidence of focal airspace disease, pulmonary edema, suspicious pulmonary nodule/mass, pleural effusion, or pneumothorax. No acute bony abnormalities are identified.  IMPRESSION: No active cardiopulmonary disease.   Electronically Signed   By: Hassan Rowan M.D.   On: 09/29/2013 15:45    EKG Interpretation   None       MDM  No diagnosis found. Sxs consistent with viral/ flu process however pt has had weight gain/ general fatigue which may be Thyroid vs fibromyalgia vs med SE vs other. D Discussed general slow wean of narcotics to see if it helps, thyroid fup with pcp and reasons to return. PO fluids, labs and cxr  Results and differential diagnosis were discussed with the patient. Close follow up outpatient was discussed, patient comfortable with the plan.   Fatigue, weight gain, Cough   Mariea Clonts, MD 09/29/13 1558

## 2013-09-29 NOTE — Discharge Instructions (Signed)
Follow up for your thyroid result. If you were given medicines take as directed.  If you are on coumadin or contraceptives realize their levels and effectiveness is altered by many different medicines.  If you have any reaction (rash, tongues swelling, other) to the medicines stop taking and see a physician.   Please follow up as directed and return to the ER or see a physician for new or worsening symptoms.  Thank you.

## 2013-09-29 NOTE — ED Notes (Signed)
Patient here with congestion, bodyaches, chills x 5 days. Reports that she is experiencing general malaise as well

## 2013-10-02 ENCOUNTER — Encounter: Payer: Self-pay | Admitting: Adult Health

## 2013-10-02 ENCOUNTER — Ambulatory Visit (INDEPENDENT_AMBULATORY_CARE_PROVIDER_SITE_OTHER): Payer: Medicare Other | Admitting: Adult Health

## 2013-10-02 VITALS — BP 118/74 | HR 72 | Temp 98.2°F | Ht 66.0 in | Wt 182.4 lb

## 2013-10-02 DIAGNOSIS — J329 Chronic sinusitis, unspecified: Secondary | ICD-10-CM | POA: Diagnosis not present

## 2013-10-02 MED ORDER — AZITHROMYCIN 250 MG PO TABS: ORAL_TABLET | ORAL | Status: AC

## 2013-10-02 NOTE — Assessment & Plan Note (Signed)
Zpack take as directed.  Mucinex DM Twice daily  As needed  Cough /congestion  Saline nasal rinses As needed   Please contact office for sooner follow up if symptoms do not improve or worsen or seek emergency care  Follow up Dr. Lenna Gilford  As planned and As needed   If not improving with sinus may need referral to ENT to evaluate.

## 2013-10-02 NOTE — Patient Instructions (Signed)
Zpack take as directed.  Mucinex DM Twice daily  As needed  Cough /congestion  Saline nasal rinses As needed   Please contact office for sooner follow up if symptoms do not improve or worsen or seek emergency care  Follow up Dr. Nadel  As planned and As needed   If not improving with sinus may need referral to ENT to evaluate.   

## 2013-10-02 NOTE — Progress Notes (Signed)
Subjective:    Patient ID: [name], female    DOB: Sep 05, 1946, 69 y.o.   MRN: [id number]  HPI 68 y/o WF hx mult med problems and is well known to [place]... also followed in the [facility], and by numerous specialists as noted below...   10/02/2013 Acute OV  Complains of sinus pressure, dry mouth, x2 weeks.  denies f/c/s, congestion, PND, dyspnea, wheezing, chest tightness, nausea, vomiting, edema. [facility] and given doxycycline. Patient had no improvement in symptoms. Patient was seen at the emergency room on January 10. Chest x-ray with no acute findings noted. Lab work was essentially unremarkable. Patient complains that she continues to have sinus pain pressure. She denies any hemoptysis, orthopnea, PND, or leg swelling.            Problem List:  UNSPECIFIED SLEEP APNEA (ICD-780.57) - Complex sleep apnea - PSG 07/25/09 AHI 28 > ~  Sleep eval [name] w/ 11/10 study showing AHI= 25 w/ obstructive & central events... CPAP titration was suboptimal & she was tried on BiPAP... she tells me that she stopped all this when she lost weight & husb confirms no snoring or sleep disordered breathing- rests well etc...   ALLERGIC RHINITIS (ICD-477.9)  Hx of SINUSITIS (ICD-473.9) - Hx sinusitis followed by [name] at [facility]...  HYPERTENSION (ICD-401.9) - on METOPROLOL 71m Qam now ... she was hosp 3/09 w/ gastroenteritis & BP meds were stopped due to weakness; grad restarted w/ improvement in BP & palpit... ~  11/11: BP= 122/70 she is INTOL to Diovan; denies HA, visual symptoms, CP, palpit, dizzy, syncope, edema; husb notes that BPs are similar at home ~120/70's... ~  5/12:  BP= 112/70 & she has the same chr fatigue complaints, but no specific symptoms... ~  11/12:  BP=130/80 & she has the same chronic complaints but denies CP, syncope, ch in SOB, edema; [facility]; she wants to decr the Metoprolol to 260mdaily- ok... ~  CXR 11/12 showed clear lungs,  normal heart size, mild DJD TSpine... ~  5/13:  BP= 142/80 & she persists w/ mult chronic complaints... ~  11/13:  BP= 140/82 & she is clinically stable...  CHEST PAIN (ICD-786.50) - seen by [name] in 2006... ~  2DEcho 11/98 showed norm valves, norm LVF, ? min LVH... ~  cath 7/03 w/ norm coronaries, & norm LVF... ~  NuclearStressTest 2/06 showed no infarct, no ischemia, EF=65%... ~  Myoview repeated 4/10: low risk study w/ soft tissue attenuation, normal wall motion, EF= 78%... ~  EKG 11/12 showed NSR, WNL...Marland KitchenMarland KitchenPALPITATIONS, HX OF (ICD-V12.50) - notes occas fluttering and "jello feeling" in her chest... Metoprolol helps.  HYPERCHOLESTEROLEMIA (ICD-272.0) - on ZOCOR 4048ms + Fish Oil  + OTC Niacin> followed in the LC. ~  FLP 01/03/08 on SIMVASTATIN 38m81mshowed TChol 206, TG 182, HDL 31, LDL 145... ~  FLP [place]9 (on Simva40) showed TChol 173, TG 199, HDL 31, LDL 102... continue f/u in lipid clinic. ~  FLP [place]09 (on Simva40) showed TChol 196, TG 265, HDL 32, LDL 119... she stopped lipid clinic. ~  Due to continued "side effects" she placed the Simvastatin on hold, then restarted 40mg78m. ~  FLP 6/10 showed TChol 218, TG 394, HDL 33, LDL 129...  ~  FLP 1[place]0 showed TChol 262, TG 568, HDL 31, LDL 144... she needs to ret [facility] [place] after 20# wt loss showed TChol 144, TG 181,  HDL 43, LDL 65 on Simva40 ~  FLP 10/11 on Simva40+FishOil showed TChol 145, TG 237, HDL 33, LDL 79 ~  FLP 2/12 on Simva40+FishOil showed TChol 126, TG 238, HDL 27, LDL 66 ~  FLP 6/12 in EPIC per LC showed TChol 160, TG 226, HDL 31, LDL 92... Continue meds, low fat diet, exercise. ~  FLP 12/12 by LC on Simva40+Niasp500 showed TChol 113, TG 87, HDL 36, LDL 60 ~  She continues regular f/u in [facility]...  GERD (ICD-530.81) - on PRILOSEC 89m/d, and Phenergan as needed... ~  prev EGD 8/05 showed [place] gastric polyp... prev EGD w/ stricture dilated. ~  EGD repeated 12/09 & showed 5cm HH w/ healing  ulcer, esophagitis, gastritis, etc... HPylori neg... REC> continue Prilosec, Phenergan. ~  she saw [name], [name] 8/10 for eval of nausea- no help from Reglan, Scopalamine, etc... had UGI/ Ba swallow- sm HH, mild reflux, norm peristalsis;  Gastric Emptying Scan- normal;  [facility], neg CLO, benign gastric polyp removed, Rx'd PPI. ~  5/12:  She states that she no longer has any GI issues on her vegan diet... ~  11/12:  She notes that JWille Glasergives her juiced carrots and spinach daily...  DIVERTICULOSIS OF COLON (ICD-562.10) - Rx w/ PROCTOSOL HC Cream Prn, and stool softeners... COLONIC POLYPS (ICD-211.3) - colonoscopy 8/07 by [name] showed divertics only...  CYSTITIS (ICD-595.9) - [facility]... prev DMSO Rx. ~  Eval by [name] & pt tells me she is on El[place] both orally & via bladder (self-cath). ~  9/11:  she reports IC is quiet & off Elmiron Rx now. ~  11/12:  She confirms min symptoms at present...  DEGENERATIVE JOINT DISEASE (ICD-715.90) - prev Rx MOBIC 7.57mPrn, now on OXYCODONE- taking daily for her pain [name]... she had a right TKR 12/07 by [name]... then required right Patella surg 10/[place]. now s/[facility]... ~  9/11: she tells me that [name] [facility] ([name]- there was nothing they could do & rec Pain Management, [facility], & veggie diet). ~  11/12:  She reports left knee bone-on-bone [facility]/ [facility]; still followed by Pain Management & she tells me she's taking Percocet 1081mvery 4H (ave 3-4 per day). ~  11/13:  She continues to follow up w/ [name] 7 reports taking Oxycod 26m7m4 tabs daily, plus Mobic7.5...  Marland KitchenMarland KitchenCK PAIN, LUMBAR (ICD-724.2) - she had a right L4-5 decompressive laminotomy w/ foraminotomy  12/02 by DrPool... then L4-5 decompression & fusion 9/09 by DrPool due to DDD w/ synovial cyst & degen spondylolithesis w/ stenosis... f/u XRays and MRI 5/10 showed fusion intact, NAD... ~[name] CHRONIC PAIN MANAGEMENT> followed by [name] clinic now, on OXYCODONE 10-26mg44mry 4H prn (ave 3-4/d she says), plus MOBIC 7.5mg/d45m  9/13:  She reports that she had ESI from DrIbazedo, w/ some improvement...  FIBROMYALGIA (ICD-729.1) - she continues to have mult somatic complaints... she has seen DrDeveshwar in the past...  ~  She reports trial of Accupuncture> it helped the FM but not the knee pain. ~  husb has inquired about periph nerve surg he researched on the internet, he is referred to her specialists to discuss options... ~  11/13:  They indicate that they have  a f/u appt w/ DrDeveshwar for Rheum...  OSTEOPENIA (ICD-733.90) - on calcium, MVI, Vit D... VITAMIN D DEFICIENCY (ICD-268.9) -  ~  Vit D level was 5 (30-90) 11/08 & rec to start VitD 50K/wk... ~  Vit D level 11/09 = 45  ~  Vit D level 6/10 = 23 ~  Vit D level 11/10 = 35 ~  Vit D level 5/11 = 70... switched to Vit D 2000u daily. ~  BMD 12/12 by DrFontaine showed lowest measured site= right FemNeck -1.2  ANXIETY (ICD-300.00) - on ALPRAZOLAM 0.60m as needed- taking 3 per day recently (she notes mild tremor & this helps). DEPRESSION (ICD-311)  Hx of PSORIASIS (ICD-696.1)   Hx of UNSPECIFIED DISORDER PLASMA PROTEIN METABOLISM (ICD-273.9) - she has a biclonal gammopathy evaluated 10/05 by [name] who thought it was reactive, perhaps a MGUS variant... ~  f/u labs 11/09 showed Hg= 12.2, TProt= 7.1, SPE/ IEP w/ biclonal IgM kappa protein present accounting for 0.31g/dl of the total 0.91 g/dl of protein in the gamma region & IgM level = 319 mg/dl... ~  labs 11/10 showed Hg= 12.5, TProt= 7.8, SPEP/IEP w/ monoclonal IgM kappa & IgM lamba proteins accounting for 0.44 g/dl of the total 1.04 g/dl in the gamma region... IgM level = 491mdl... ~   11/10:  Referred back to [name] who has resumed follow up of this problem... ~  4/11:  [name] did CT Chest/ Abd/ Pelvis> neg w/o adenopathy etc... ~  2012> she was given IV iron by [name] & reports feeling some better... ~  11/12:  Labs here showed Hg= 13.7, Fe= 61 (18%sat) ~  Labs from DrLandAmerica Financialonce yearly in the fall) reviewed in EPIC flow sheet ==> IgM monoclonal gammopathy w/ stable parameters.   Past Surgical History  Procedure Laterality Date  . Laminotomy  2010    L-4, L-5 FUSION  . Knee arthroscopy      RIGHT KNEE X 5  . Total knee arthroplasty  12-07 and 4-07    Dr. BeTonita Congrevision 12-09 [facility]. [facility]zer [facility]. Patella fracture surgery  10-08    Dr. OlAlvan Dame. Posterior laminectomy / decompression lumbar spine  9-09    Dr. PoAnnette Stable. Cholecystectomy    . Hand surgery      TRIGGER FINGER  . Hernia repair    . Laparoscopic oophorectomy  2010    BILATERAL  . Abdominal hysterectomy      for prolapse with abdominal incision  . Oophorectomy      BSO    Outpatient Encounter Prescriptions as of 10/02/2013  Medication Sig  . ALPRAZolam (XANAX) 0.5 MG tablet TAKE ONE-HALF TO ONE TABLET BY MOUTH THREE TIMES DAILY AS NEEDED  . Cholecalciferol (VITAMIN D) 2000 UNITS CAPS Take 2 capsules by mouth daily.   . citalopram (CELEXA) 20 MG tablet Take 10 mg by mouth daily.   . Diphenhyd-Hydrocort-Nystatin (FIRST-DUKES MOUTHWASH) SUSP Take 1 tsp gargle and swallow four times daily as needed.  . fish oil-omega-3 fatty acids 1000 MG capsule Take 2 g by mouth daily.   . hydrocortisone 2.5 % cream Apply 1 application topically as needed. RECTAL ITCHING  . levothyroxine (SYNTHROID, LEVOTHROID) 50 MCG tablet 1/2 tab by mouth once daily  . meloxicam (MOBIC) 7.5 MG tablet Take 15 mg by mouth daily. Take 2 tablets by mouth daily  . metoprolol tartrate (LOPRESSOR) 25 MG tablet Take 12.5 mg by mouth every morning.   . Multiple Vitamins-Iron (MULTIVITAMIN/IRON PO) Take 1 tablet by  mouth daily.   .  niacin (NIASPAN) 500 MG CR tablet Take 1 tablet (500 mg total) by mouth at bedtime.  . Oxycodone HCl 10 MG TABS Take 10 mg by mouth every 4 (four) hours.  . simvastatin (ZOCOR) 40 MG tablet Take 40 mg by mouth at bedtime.  [name] azithromycin (ZITHROMAX Z-PAK) 250 MG tablet Take 2 tablets (500 mg) on  Day 1,  followed by 1 tablet (250 mg) once daily on Days 2 through 5.  . [DISCONTINUED] amoxicillin-clavulanate (AUGMENTIN) 875-125 MG per tablet Take 1 tablet by mouth 2 (two) times daily.  . [DISCONTINUED] methylPREDNIsolone (MEDROL DOSPACK) 4 MG tablet follow package directions     Allergies  Allergen Reactions  . Cymbalta [Duloxetine Hcl]     Facial edema  . Mepergan [Meperidine-Promethazine]     Cardiac arrest  . Iohexol      Code: HIVES, Desc: pt states her face and throat swells when administered IV contrast - KB, Onset Date: [id number]   . Valsartan     REACTION: pt states INTOL to Diovan    Current Medications, Allergies, Past Medical History, Past Surgical History, Family History, and Social History were reviewed in [employer] record.    Review of Systems         Constitutional:   No  weight loss, night sweats,  Fevers, chills,  +fatigue, or  lassitude.  HEENT:   No headaches,  Difficulty swallowing,  Tooth/dental problems, or  Sore throat,                No sneezing, itching, ear ache,  +nasal congestion, post nasal drip,   CV:  No chest pain,  Orthopnea, PND, swelling in lower extremities, anasarca, dizziness, palpitations, syncope.   GI  No heartburn, indigestion, abdominal pain, nausea, vomiting, diarrhea, change in bowel habits, loss of appetite, bloody stools.   Resp:   No excess mucus, no productive cough,  No non-productive cough,  No coughing up of blood.  No change in color of mucus.  No wheezing.  No chest wall deformity  Skin: no rash or lesions.  GU: no dysuria, change in color of urine, no urgency or frequency.  No flank pain, no  hematuria   MS:  No joint swelling.           Objective:   Physical Exam     WD, WN, 68 y/o WF in NAD... GENERAL:  Alert & oriented; tearful , appears depressed  HEENT:  [place]/AT,  EACs-clear, TMs-wnl, NOSE-clear drainage , max sinus tenderness. THROAT-clear & wnl. NECK:  Supple w/ fairROM; no JVD; normal carotid impulses w/o bruits; no thyromegaly or nodules palpated; no lymphadenopathy. CHEST:  Clear to P & A; without wheezes/ rales/ or rhonchi. HEART:  Regular Rhythm; without murmurs/ rubs/ or gallops. ABDOMEN:  Soft & nontender; normal bowel sounds; no organomegaly or masses detected. EXT: s/p right TKR, mod arthritic changes; no varicose veins/ +venous insuffic/ tr edema.  NEURO:  no focal neuro deficits...noted on exam.    Assessment & Plan:

## 2013-10-09 DIAGNOSIS — F329 Major depressive disorder, single episode, unspecified: Secondary | ICD-10-CM | POA: Diagnosis not present

## 2013-10-09 DIAGNOSIS — M1710 Unilateral primary osteoarthritis, unspecified knee: Secondary | ICD-10-CM | POA: Diagnosis not present

## 2013-10-09 DIAGNOSIS — F3289 Other specified depressive episodes: Secondary | ICD-10-CM | POA: Diagnosis not present

## 2013-10-09 DIAGNOSIS — G894 Chronic pain syndrome: Secondary | ICD-10-CM | POA: Diagnosis not present

## 2013-10-09 DIAGNOSIS — M961 Postlaminectomy syndrome, not elsewhere classified: Secondary | ICD-10-CM | POA: Diagnosis not present

## 2013-10-10 DIAGNOSIS — IMO0002 Reserved for concepts with insufficient information to code with codable children: Secondary | ICD-10-CM | POA: Diagnosis not present

## 2013-10-10 DIAGNOSIS — S8000XA Contusion of unspecified knee, initial encounter: Secondary | ICD-10-CM | POA: Diagnosis not present

## 2013-10-11 ENCOUNTER — Other Ambulatory Visit: Payer: Self-pay | Admitting: Pulmonary Disease

## 2013-10-12 ENCOUNTER — Other Ambulatory Visit: Payer: Self-pay | Admitting: Pulmonary Disease

## 2013-10-15 ENCOUNTER — Telehealth: Payer: Self-pay | Admitting: Pulmonary Disease

## 2013-10-15 MED ORDER — SIMVASTATIN 40 MG PO TABS: 40.0000 mg | ORAL_TABLET | Freq: Every day | ORAL | Status: DC

## 2013-10-15 MED ORDER — METOPROLOL TARTRATE 25 MG PO TABS: 12.5000 mg | ORAL_TABLET | Freq: Every morning | ORAL | Status: DC

## 2013-10-15 NOTE — Telephone Encounter (Signed)
I called and spoke with [name]. She is requesting flonase RX. She reports otc is very expensive. She c/o nasal congestion, cheek bones hurt x couple weeks.  no PND, no fever, no chills, no sweats, no cough, no body aches. Please advise SN thanks  Allergies  Allergen Reactions  . Cymbalta [Duloxetine Hcl]     Facial edema  . Mepergan [Meperidine-Promethazine]     Cardiac arrest  . Iohexol      Code: HIVES, Desc: [name] states her face and throat swells when administered IV contrast - KB, Onset Date: [id number]   . Valsartan     REACTION: [name] states INTOL to Diovan

## 2013-10-16 MED ORDER — FLUTICASONE PROPIONATE 50 MCG/ACT NA SUSP: 2.0000 | Freq: Two times a day (BID) | NASAL | Status: DC

## 2013-10-16 NOTE — Telephone Encounter (Signed)
Per SN---  Call in the flonase for the pt  #1  2 sprays each nostril bid.  thanks

## 2013-10-16 NOTE — Telephone Encounter (Signed)
Rx has been sent in. Pt is aware. 

## 2013-10-23 ENCOUNTER — Ambulatory Visit (INDEPENDENT_AMBULATORY_CARE_PROVIDER_SITE_OTHER): Payer: Medicare Other | Admitting: Gynecology

## 2013-10-23 ENCOUNTER — Encounter: Payer: Self-pay | Admitting: Gynecology

## 2013-10-23 VITALS — BP 126/80 | Ht 66.0 in | Wt 183.0 lb

## 2013-10-23 DIAGNOSIS — N952 Postmenopausal atrophic vaginitis: Secondary | ICD-10-CM | POA: Diagnosis not present

## 2013-10-23 DIAGNOSIS — M899 Disorder of bone, unspecified: Secondary | ICD-10-CM

## 2013-10-23 DIAGNOSIS — M949 Disorder of cartilage, unspecified: Secondary | ICD-10-CM | POA: Diagnosis not present

## 2013-10-23 DIAGNOSIS — M8580 Other specified disorders of bone density and structure, unspecified site: Secondary | ICD-10-CM

## 2013-10-23 NOTE — Progress Notes (Signed)
[name] 11-05-1945 [id number]        68 y.o.  G0P0 for followup exam.  Several issues noted below.  Past medical history,surgical history, problem list, medications, allergies, family history and social history were all reviewed and documented in the EPIC chart.  ROS:  Performed and pertinent positives and negatives are included in the history, assessment and plan .  Exam: Kim assistant Filed Vitals:   10/23/13 1101  BP: 126/80  Height: 5\' 6"  (1.676 m)  Weight: 183 lb (83.008 kg)   General appearance  Normal Skin grossly normal Head/Neck normal with no cervical or supraclavicular adenopathy thyroid normal Lungs  clear Cardiac RR, without RMG Abdominal  soft, nontender, without masses, organomegaly or hernia Breasts  examined lying and sitting without masses, retractions, discharge or axillary adenopathy. Pelvic  Ext/BUS/vagina  Normal with atrophic changes  Adnexa  Without masses or tenderness    Anus and perineum  Normal   Rectovaginal  Normal sphincter tone without palpated masses or tenderness.    Assessment/Plan:  68 y.o. G0P0 female for followup exam .   1.  Postmenopausal status post TAH for prolapsed subsequent laparoscopic BSO. Doing well without significant hot flushes, night sweats, vaginal dryness. Is not sexually active. Will continue to monitor. 2. Osteopenia. DEXA 08/2011 T score -1.2. FRAX 13%/1.2%. Repeat DEXA now and she will schedule. Increase calcium vitamin D reviewed. 3. Pap smear 2011. No Pap smear done today. Reviewed current screening guidelines. Patient is over the age 42 and status post hysterectomy for benign indications. Options discussed renal together or less frequent screening intervals discussed. Will readdress on an annual basis. 4. Mammography 05/2013. Continue with annual mammography. SBE monthly reviewed. 5. Colonoscopy 2007. Patient reports plan to repeat at 10 year interval. 6. Fatigue. Patient reports just not feeling well and very  tired. Has recently had thyroid checked which was normal and normal CBC. She is on chronic narcotics for pain. Has an appointment to see her pain doctor and I have asked her to make sure she discusses this with them to see if they can adjust dosages. Also we'll see her primary physician in followup. 7. Health maintenance. No blood work done as she has this done through her primary physician's office. Followup one year, sooner as needed.   Note: This document was prepared with digital dictation and possible smart phrase technology. Any transcriptional errors that result from this process are unintentional.   Anastasio Auerbach MD, 11:42 AM 10/23/2013

## 2013-10-23 NOTE — Patient Instructions (Signed)
Followup for bone density as scheduled. 

## 2013-10-24 LAB — URINALYSIS W MICROSCOPIC + REFLEX CULTURE
BILIRUBIN URINE: NEGATIVE
Bacteria, UA: NONE SEEN
Casts: NONE SEEN
Glucose, UA: NEGATIVE mg/dL
HGB URINE DIPSTICK: NEGATIVE
KETONES UR: NEGATIVE mg/dL
Leukocytes, UA: NEGATIVE
NITRITE: NEGATIVE
PH: 5 (ref 5.0–8.0)
Protein, ur: NEGATIVE mg/dL
Specific Gravity, Urine: 1.019 (ref 1.005–1.030)
Squamous Epithelial / LPF: NONE SEEN
Urobilinogen, UA: 0.2 mg/dL (ref 0.0–1.0)

## 2013-11-09 DIAGNOSIS — M1710 Unilateral primary osteoarthritis, unspecified knee: Secondary | ICD-10-CM | POA: Diagnosis not present

## 2013-11-09 DIAGNOSIS — M961 Postlaminectomy syndrome, not elsewhere classified: Secondary | ICD-10-CM | POA: Diagnosis not present

## 2013-11-09 DIAGNOSIS — G894 Chronic pain syndrome: Secondary | ICD-10-CM | POA: Diagnosis not present

## 2013-11-09 DIAGNOSIS — F329 Major depressive disorder, single episode, unspecified: Secondary | ICD-10-CM | POA: Diagnosis not present

## 2013-11-09 DIAGNOSIS — F3289 Other specified depressive episodes: Secondary | ICD-10-CM | POA: Diagnosis not present

## 2013-11-30 ENCOUNTER — Other Ambulatory Visit: Payer: Self-pay | Admitting: Pulmonary Disease

## 2013-11-30 DIAGNOSIS — D126 Benign neoplasm of colon, unspecified: Secondary | ICD-10-CM

## 2013-11-30 DIAGNOSIS — E559 Vitamin D deficiency, unspecified: Secondary | ICD-10-CM

## 2013-11-30 DIAGNOSIS — J329 Chronic sinusitis, unspecified: Secondary | ICD-10-CM

## 2013-11-30 DIAGNOSIS — E7800 Pure hypercholesterolemia, unspecified: Secondary | ICD-10-CM

## 2013-12-03 ENCOUNTER — Other Ambulatory Visit: Payer: Self-pay | Admitting: Pulmonary Disease

## 2013-12-05 ENCOUNTER — Telehealth: Payer: Self-pay | Admitting: Pulmonary Disease

## 2013-12-05 MED ORDER — ALPRAZOLAM 0.5 MG PO TABS: ORAL_TABLET | ORAL | Status: DC

## 2013-12-05 NOTE — Telephone Encounter (Signed)
Called spoke with pt. Aware rx has been called in. She already has pending appt with PCP 02/12/14. Nothing further needed

## 2013-12-13 ENCOUNTER — Ambulatory Visit: Payer: Medicare Other | Admitting: Family Medicine

## 2013-12-13 DIAGNOSIS — M76899 Other specified enthesopathies of unspecified lower limb, excluding foot: Secondary | ICD-10-CM | POA: Diagnosis not present

## 2013-12-17 ENCOUNTER — Ambulatory Visit (INDEPENDENT_AMBULATORY_CARE_PROVIDER_SITE_OTHER): Payer: Medicare Other | Admitting: Family Medicine

## 2013-12-17 ENCOUNTER — Encounter: Payer: Self-pay | Admitting: Family Medicine

## 2013-12-17 VITALS — BP 136/85 | HR 58 | Temp 98.0°F | Resp 18 | Ht 66.0 in | Wt 182.0 lb

## 2013-12-17 DIAGNOSIS — R350 Frequency of micturition: Secondary | ICD-10-CM | POA: Diagnosis not present

## 2013-12-17 LAB — POCT URINALYSIS DIPSTICK
Glucose, UA: NEGATIVE
Ketones, UA: 15
LEUKOCYTES UA: NEGATIVE
NITRITE UA: POSITIVE
PH UA: 5
RBC UA: NEGATIVE
Spec Grav, UA: >=1.03
UROBILINOGEN UA: 1

## 2013-12-17 MED ORDER — SULFAMETHOXAZOLE-TRIMETHOPRIM 800-160 MG PO TABS: 1.0000 | ORAL_TABLET | Freq: Two times a day (BID) | ORAL | Status: DC

## 2013-12-17 NOTE — Progress Notes (Signed)
Office Note 12/17/2013  CC:  Chief Complaint  Patient presents with  . Establish Care  . Edema    in legs but it is getting better.   . Urinary Frequency    HPI:  [name] is a 68 y.o. White female who is here to establish care, has a list of problems. Patient's most recent primary MD: Dr. Teressa Lower. Old records in EPIC/HL were reviewed prior to or during today's visit.  1) Chronic pain secondary to right knee osteoarthritis and subsequent TKR, has been seeing pain mgmt MD at [facility] (oxycodone) and pt wonders if she is having a med side effect from oxycodone --describes some random muscle twitches, infrequent, various areas of the body.  Has been on oxycodone long term.  2) Questions about CRI--she was told by her GYN she had this dx and she doesn't know what it means.  3) Intermittent nonexertional pain in left breast/chest wall, sometimes extends to left shoulder and left upper back region.  Pt states "but I have fibromyalgia so I don't know if that may be what it all is or not".  4) Wt loss PURPOSEFUL from 192 down to 163 lbs, then gained wt back up to 185 lbs but says "nothing changed" with diet or activity level.  5) Insomnia: Taking otc benadryl.  Has been rx'd trazodone and lexapro in the past to help with this complaint but says both caused palpitations and jittery feeling. Says she has always been very "sensitive" to medications.  6) Last couple of weeks has felt urinary urgency and frequency, "but not much comes out". No dysuria.  Says she has been rx'd pyridium and she takes this when she doesn't feel like she empties bladder well and it helps.  She has taken this as recently as today.  No fever, no abd pains, no nausea, no flank pain, no hematuria.    Past Medical History  Diagnosis Date  . OSA (obstructive sleep apnea)     Dr. Halford Chessman  . Allergic rhinitis   . HTN (hypertension)   . Chest pain     cr  . Palpitations   . Hypercholesterolemia    . GERD (gastroesophageal reflux disease)   . Diverticulosis of colon   . History of colonic polyps   . DJD (degenerative joint disease)   . Lumbar back pain   . Fibromyalgia   . Osteopenia 08/2011    DEXA t score -1.2 FRAX 13%/1.2%  . Vitamin D deficiency   . Anxiety   . Depression   . Psoriasis   . Unspecified disorder of plasma protein metabolism     IgM kappa MGUS ([name]) routine f/u has shown stability of this problem.  . IC (interstitial cystitis)     [name] at [place]  . Iron deficiency anemia, unspecified 06/27/2013  . Memory impairment 10/03/2012    Memory impairment 10/03/2012 >referral to neuro    . Hypothyroid 07/05/2012    TSH ~5.92 >begin Synthroid 29mcg daily 07/05/2012  TSH 2.42 09/28/12 >decreased synthroid 65mcg daily    . FATIGUE / MALAISE 07/01/2009    Chronic  . Chronic renal insufficiency, stage II (mild) 2015    CrCl in th 60s.  . Chronic pain syndrome     right knee--[name]    Past Surgical History  Procedure Laterality Date  . Laminotomy  2010    L-4, L-5 FUSION  . Knee arthroscopy      RIGHT KNEE X 5  .  Total knee arthroplasty  12-07 and 4-07    [name], revision 12-09 [name] at [facility]  . Patella fracture surgery  10-08    [name]  . Posterior laminectomy / decompression lumbar spine  9-09    [name]  . Cholecystectomy    . Hand surgery      TRIGGER FINGER  . Hernia repair    . Abdominal hysterectomy      for prolapse with abdominal incision  . Oophorectomy  2010    Laparoscopic BSO (path benign)  . Esophagogastroduodenoscopy  05/29/2009    Esoph normal.  Gastritis (H pylori NEG) + gastric polyps (benign).  Duodenal biopsy NORMAL.  [name] Colonoscopy  04/2006    Diverticulosis, o/w normal.    Family History  Problem Relation Age of Onset  . Hypertension Mother   . Hypertension Father   . Breast cancer Sister     Age 25's  . Hypertension Sister   . Leukemia Brother   . Lung disease Brother   . Cancer  Brother     Melanoma  . Coronary artery disease Sister   . Lupus Sister     History   Social History  . Marital Status: Married    Spouse Name: Jaelynn Pozo    Number of Children: 0  . Years of Education: N/A   Occupational History  .     Social History Main Topics  . Smoking status: Never Smoker   . Smokeless tobacco: Never Used  . Alcohol Use: No  . Drug Use: No  . Sexual Activity: Not Currently    Birth Control/ Protection: Surgical   Other Topics Concern  . Not on file   Social History Narrative  . No narrative on file    Outpatient Encounter Prescriptions as of 12/17/2013  Medication Sig  . ALPRAZolam (XANAX) 0.5 MG tablet TAKE ONE-HALF TO ONE TABLET BY MOUTH THREE TIMES DAILY AS NEEDED  . Cholecalciferol (VITAMIN D) 2000 UNITS CAPS Take 2 capsules by mouth daily.   . fish oil-omega-3 fatty acids 1000 MG capsule Take 2 g by mouth daily.   . fluticasone (FLONASE) 50 MCG/ACT nasal spray Place 2 sprays into both nostrils 2 (two) times daily.  . hydrocortisone 2.5 % cream Apply 1 application topically as needed. RECTAL ITCHING  . levothyroxine (SYNTHROID, LEVOTHROID) 50 MCG tablet 1/2 tab by mouth once daily  . meloxicam (MOBIC) 7.5 MG tablet TAKE TWO TABLETS BY MOUTH EVERY DAY  . metoprolol tartrate (LOPRESSOR) 25 MG tablet Take 0.5 tablets (12.5 mg total) by mouth every morning.  . Multiple Vitamins-Iron (MULTIVITAMIN/IRON PO) Take 1 tablet by mouth daily.   . niacin (NIASPAN) 500 MG CR tablet Take 1 tablet (500 mg total) by mouth at bedtime.  . Oxycodone HCl 10 MG TABS Take 10 mg by mouth every 4 (four) hours.  . simvastatin (ZOCOR) 40 MG tablet Take 1 tablet (40 mg total) by mouth at bedtime.  . Diphenhyd-Hydrocort-Nystatin (FIRST-DUKES MOUTHWASH) SUSP Take 1 tsp gargle and swallow four times daily as needed.  . sulfamethoxazole-trimethoprim (BACTRIM DS,SEPTRA DS) 800-160 MG per tablet Take 1 tablet by mouth 2 (two) times daily.    Allergies  Allergen Reactions   . Cymbalta [Duloxetine Hcl]     Facial edema  . Iohexol      Code: HIVES, Desc: pt states her face and throat swells when administered IV contrast - KB, Onset Date: [id number]   . Mepergan [Meperidine-Promethazine]     Cardiac arrest  .  Augmentin [Amoxicillin-Pot Clavulanate] Nausea Only  . Citalopram Other (See Comments)    Jittery/heart racing   . Lexapro [Escitalopram Oxalate] Other (See Comments)    Jittery,heart racing  . Lyrica [Pregabalin] Swelling    Face, hands, fingers, ankles  . Valsartan     REACTION: pt states INTOL to Diovan    ROS Review of Systems  Constitutional: Negative for fever and fatigue.  HENT: Negative for congestion and sore throat.   Eyes: Negative for visual disturbance.  Respiratory: Negative for cough.   Gastrointestinal: Negative for nausea, abdominal pain and blood in stool.  Genitourinary: Positive for urgency, frequency and difficulty urinating. Negative for dysuria, vaginal bleeding and vaginal discharge.  Musculoskeletal: Positive for arthralgias (chronic, right knee). Negative for back pain and joint swelling.  Skin: Negative for rash.  Neurological: Negative for weakness and headaches.  Hematological: Negative for adenopathy.  Psychiatric/Behavioral: Positive for sleep disturbance.    PE; Blood pressure 136/85, pulse 58, temperature 98 F (36.7 C), temperature source Temporal, resp. rate 18, height 5\' 6"  (1.676 m), weight 182 lb (82.555 kg), SpO2 94.00%. Gen: Alert, well appearing.  Patient is oriented to person, place, time, and situation. Walks slowly, with limp. AFFECT: pleasant, lucid thought and speech. [id number]: no injection, icteris, swelling, or exudate.  EOMI, PERRLA. Mouth: lips without lesion/swelling.  Oral mucosa pink and moist. Oropharynx without erythema, exudate, or swelling.  Neck - No masses or thyromegaly or limitation in range of motion CV: RRR, no m/r/g.   LUNGS: CTA bilat, nonlabored resps, good aeration in all  lung fields. ABD: soft, NT/ND EXT: no clubbing, cyanosis, or edema.   Pertinent labs:  CC UA today showed dark/orange color, with +nitrite, large Leu, +ketones, +protein, no blood or glucose (pt taking pyridium)  Recent labs:  Lab Results  Component Value Date   WBC 8.1 09/29/2013   HGB 13.3 09/29/2013   HCT 40.6 09/29/2013   MCV 86.2 09/29/2013   PLT 228 09/29/2013     Chemistry      Component Value Date/Time   NA 143 09/29/2013 1540   NA 145 01/15/2010 0835   K 4.5 09/29/2013 1540   K 4.2 01/15/2010 0835   CL 103 09/29/2013 1540   CL 103 01/15/2010 0835   CO2 29 09/29/2013 1540   CO2 30 01/15/2010 0835   BUN 9 09/29/2013 1540   BUN 16 01/15/2010 0835   CREATININE 0.90 09/29/2013 1540   CREATININE 0.9 01/15/2010 0835      Component Value Date/Time   CALCIUM 9.4 09/29/2013 1540   CALCIUM 9.9 01/15/2010 0835   ALKPHOS 95 06/27/2013 1137   ALKPHOS 109* 01/15/2010 0835   AST 16 06/27/2013 1137   AST 18 01/15/2010 0835   ALT 14 06/27/2013 1137   ALT 13 01/15/2010 0835   BILITOT 0.4 06/27/2013 1137   BILITOT 0.60 01/15/2010 0835     Lab Results  Component Value Date   CHOL 240* 08/13/2013   HDL 44.50 08/13/2013   LDLCALC 47 02/01/2012   LDLDIRECT 187.8 08/13/2013   TRIG 84.0 08/13/2013   CHOLHDL 5 08/13/2013   Lab Results  Component Value Date   TSH 2.111 09/29/2013     ASSESSMENT AND PLAN:   New pt: extensive review of old records in EMR.  1) Currently her most acute issue is her urinary complaints, which could be infectious vs noninfectious cystitis.  UA not reliable due to pyridium intake. Will send urine for c/s and will empirically treat with bactrim DS 1  tab bid x 3d.  We discussed her CRI, which I reassured her was not an acute problem and not a problem that would be causing her any symptoms at this time. For her insomnia, I gave her permission to take an extra xanax tab qhs since this has been helpful and she has such a history of medication intolerance.  We had to defer  any discussion of her other issues (pain, involuntary muscle twitching, wt gain concerns) until next visit, which I encouraged her to make at any time.  It seems she will wait until her previously scheduled routine 6 mo f/u that was supposed to have been with Dr. Nadel--02/12/14.  Spent 35 min with pt today, with >50% of this time spent in counseling and care coordination regarding the above problems.  Return for routine chronic illness f/u (fasting) at the end of May this year. In review of labs done recently, it appears that the only ones that bear repeating at this next f/u are BMET and FLP.

## 2013-12-17 NOTE — Progress Notes (Signed)
Pre visit review using our clinic review tool, if applicable. No additional management support is needed unless otherwise documented below in the visit note. 

## 2013-12-18 LAB — URINE CULTURE
Colony Count: NO GROWTH
Organism ID, Bacteria: NO GROWTH

## 2014-01-04 DIAGNOSIS — M1710 Unilateral primary osteoarthritis, unspecified knee: Secondary | ICD-10-CM | POA: Diagnosis not present

## 2014-01-04 DIAGNOSIS — F329 Major depressive disorder, single episode, unspecified: Secondary | ICD-10-CM | POA: Diagnosis not present

## 2014-01-04 DIAGNOSIS — G894 Chronic pain syndrome: Secondary | ICD-10-CM | POA: Diagnosis not present

## 2014-01-04 DIAGNOSIS — F3289 Other specified depressive episodes: Secondary | ICD-10-CM | POA: Diagnosis not present

## 2014-01-04 DIAGNOSIS — M961 Postlaminectomy syndrome, not elsewhere classified: Secondary | ICD-10-CM | POA: Diagnosis not present

## 2014-01-30 DIAGNOSIS — F3289 Other specified depressive episodes: Secondary | ICD-10-CM | POA: Diagnosis not present

## 2014-01-30 DIAGNOSIS — Z79899 Other long term (current) drug therapy: Secondary | ICD-10-CM | POA: Diagnosis not present

## 2014-01-30 DIAGNOSIS — F329 Major depressive disorder, single episode, unspecified: Secondary | ICD-10-CM | POA: Diagnosis not present

## 2014-01-30 DIAGNOSIS — G894 Chronic pain syndrome: Secondary | ICD-10-CM | POA: Diagnosis not present

## 2014-01-30 DIAGNOSIS — M961 Postlaminectomy syndrome, not elsewhere classified: Secondary | ICD-10-CM | POA: Diagnosis not present

## 2014-01-30 DIAGNOSIS — M1710 Unilateral primary osteoarthritis, unspecified knee: Secondary | ICD-10-CM | POA: Diagnosis not present

## 2014-02-12 ENCOUNTER — Ambulatory Visit: Payer: Medicare Other | Admitting: Family Medicine

## 2014-02-12 ENCOUNTER — Ambulatory Visit: Payer: Medicare Other | Admitting: Pulmonary Disease

## 2014-02-13 ENCOUNTER — Ambulatory Visit (INDEPENDENT_AMBULATORY_CARE_PROVIDER_SITE_OTHER): Payer: Medicare Other | Admitting: Family Medicine

## 2014-02-13 ENCOUNTER — Encounter: Payer: Self-pay | Admitting: Family Medicine

## 2014-02-13 VITALS — BP 121/80 | HR 56 | Temp 98.8°F | Ht 66.0 in | Wt 189.0 lb

## 2014-02-13 DIAGNOSIS — M797 Fibromyalgia: Secondary | ICD-10-CM | POA: Insufficient documentation

## 2014-02-13 DIAGNOSIS — E039 Hypothyroidism, unspecified: Secondary | ICD-10-CM | POA: Diagnosis not present

## 2014-02-13 DIAGNOSIS — R5382 Chronic fatigue, unspecified: Secondary | ICD-10-CM

## 2014-02-13 DIAGNOSIS — G4700 Insomnia, unspecified: Secondary | ICD-10-CM | POA: Insufficient documentation

## 2014-02-13 DIAGNOSIS — R5381 Other malaise: Secondary | ICD-10-CM

## 2014-02-13 DIAGNOSIS — G9332 Myalgic encephalomyelitis/chronic fatigue syndrome: Secondary | ICD-10-CM | POA: Diagnosis not present

## 2014-02-13 DIAGNOSIS — R071 Chest pain on breathing: Secondary | ICD-10-CM

## 2014-02-13 DIAGNOSIS — R5383 Other fatigue: Principal | ICD-10-CM

## 2014-02-13 DIAGNOSIS — R0789 Other chest pain: Secondary | ICD-10-CM | POA: Insufficient documentation

## 2014-02-13 LAB — COMPREHENSIVE METABOLIC PANEL
ALK PHOS: 74 U/L (ref 39–117)
ALT: 23 U/L (ref 0–35)
AST: 20 U/L (ref 0–37)
Albumin: 3.7 g/dL (ref 3.5–5.2)
BUN: 12 mg/dL (ref 6–23)
CO2: 28 mEq/L (ref 19–32)
CREATININE: 1.1 mg/dL (ref 0.4–1.2)
Calcium: 9.1 mg/dL (ref 8.4–10.5)
Chloride: 106 mEq/L (ref 96–112)
GFR: 54.33 mL/min — AB (ref >60.00)
Glucose, Bld: 94 mg/dL (ref 70–99)
Potassium: 4.1 mEq/L (ref 3.5–5.1)
SODIUM: 140 meq/L (ref 135–145)
TOTAL PROTEIN: 6.4 g/dL (ref 6.0–8.3)
Total Bilirubin: 0.4 mg/dL (ref 0.2–1.2)

## 2014-02-13 LAB — CBC WITH DIFFERENTIAL/PLATELET
Basophils Absolute: 0 10*3/uL (ref 0.0–0.1)
Basophils Relative: 0.5 % (ref 0.0–3.0)
EOS ABS: 0.5 10*3/uL (ref 0.0–0.7)
Eosinophils Relative: 7.7 % — ABNORMAL HIGH (ref 0.0–5.0)
HEMATOCRIT: 37.9 % (ref 36.0–46.0)
Hemoglobin: 12.7 g/dL (ref 12.0–15.0)
LYMPHS ABS: 2.7 10*3/uL (ref 0.7–4.0)
Lymphocytes Relative: 44.7 % (ref 12.0–46.0)
MCHC: 33.6 g/dL (ref 30.0–36.0)
MCV: 85.3 fl (ref 78.0–100.0)
MONO ABS: 0.4 10*3/uL (ref 0.1–1.0)
Monocytes Relative: 6.1 % (ref 3.0–12.0)
NEUTROS PCT: 41 % — AB (ref 43.0–77.0)
Neutro Abs: 2.5 10*3/uL (ref 1.4–7.7)
Platelets: 209 10*3/uL (ref 150.0–400.0)
RBC: 4.44 Mil/uL (ref 3.87–5.11)
RDW: 13.7 % (ref 11.5–15.5)
WBC: 6.1 10*3/uL (ref 4.0–10.5)

## 2014-02-13 LAB — TSH: TSH: 6.94 u[IU]/mL — AB (ref 0.35–4.50)

## 2014-02-13 NOTE — Assessment & Plan Note (Signed)
Discussed importance of diet, exercise, adequate sleep, work on stress reduction. Also will check CBC, CMET, TSH today.

## 2014-02-13 NOTE — Assessment & Plan Note (Signed)
Insomnia, improved on xanax 0.5mg , 1 and 1/2 tabs po qhs.

## 2014-02-13 NOTE — Assessment & Plan Note (Signed)
Likely part of her fibromyalgia syndrome. No symptoms worrisome for cardiopulmonary disease. Reassured patient. Of note, he screening mammogram is UTD.

## 2014-02-13 NOTE — Progress Notes (Signed)
Office Note 02/13/2014  CC:  Chief Complaint  Patient presents with  . Follow-up    6 months    HPI:  [name] is a 68 y.o. White female who is here for 2 mo f/u:  Tired all the time--chronic complaint.    For about the last 6 mo she says about 1 time per week she has random central chest pain that is sharp and this lasts a few minutes and the remainder of the day she feels very sore in left breast tissue and sore around her side extending to the back.  Feels no breast lump/mass.  No rash noted. Assoc sx's: sometimes diaphoresis.  No new nausea.  No SOB.  No palpitations.  This not brought on by exertion and not relieved by anything she knows of.    Insomnia: doing much better taking 1 and 1/2 of her 0.5mg  xanax qhs.  ROS: no melena.  +constipation (chronic)  Past Medical History  Diagnosis Date  . OSA (obstructive sleep apnea)     Dr. Halford Chessman  . Allergic rhinitis   . HTN (hypertension)   . Chest pain     cr  . Palpitations   . Hypercholesterolemia   . GERD (gastroesophageal reflux disease)   . Diverticulosis of colon   . History of colonic polyps   . DJD (degenerative joint disease)   . Lumbar back pain   . Fibromyalgia   . Osteopenia 08/2011    DEXA t score -1.2 FRAX 13%/1.2%  . Vitamin D deficiency   . Anxiety   . Depression   . Psoriasis   . Unspecified disorder of plasma protein metabolism     IgM kappa MGUS ([name]) routine f/u has shown stability of this problem.  . IC (interstitial cystitis)     [name] at [place]  . Iron deficiency anemia, unspecified 06/27/2013  . Memory impairment 10/03/2012    Memory impairment 10/03/2012 >referral to neuro    . Hypothyroid 07/05/2012    TSH ~5.92 >begin Synthroid 71mcg daily 07/05/2012  TSH 2.42 09/28/12 >decreased synthroid 57mcg daily    . FATIGUE / MALAISE 07/01/2009    Chronic  . Chronic renal insufficiency, stage II (mild) 2015    CrCl in th 60s.  . Chronic pain syndrome     right  knee--[name]    Past Surgical History  Procedure Laterality Date  . Laminotomy  2010    L-4, L-5 FUSION  . Knee arthroscopy      RIGHT KNEE X 5  . Total knee arthroplasty  12-07 and 4-07    [name], revision 12-09 [name] at [facility]  . Patella fracture surgery  10-08    [name]  . Posterior laminectomy / decompression lumbar spine  9-09    [name]  . Cholecystectomy    . Hand surgery      TRIGGER FINGER  . Hernia repair    . Abdominal hysterectomy      for prolapse with abdominal incision  . Oophorectomy  2010    Laparoscopic BSO (path benign)  . Esophagogastroduodenoscopy  05/29/2009    Esoph normal.  Gastritis (H pylori NEG) + gastric polyps (benign).  Duodenal biopsy NORMAL.  [name] Colonoscopy  04/2006    Diverticulosis, o/w normal.  . Nasal sinus surgery      polypectomy (remote past)    Family History  Problem Relation Age of Onset  . Hypertension Mother   . Hypertension Father   .  Breast cancer Sister     Age 35's  . Hypertension Sister   . Leukemia Brother   . Lung disease Brother   . Cancer Brother     Melanoma  . Coronary artery disease Sister   . Lupus Sister     History   Social History  . Marital Status: Married    Spouse Name: [name]    Number of Children: 0  . Years of Education: N/A   Occupational History  .     Social History Main Topics  . Smoking status: Never Smoker   . Smokeless tobacco: Never Used  . Alcohol Use: No  . Drug Use: No  . Sexual Activity: Not Currently    Birth Control/ Protection: Surgical   Other Topics Concern  . Not on file   Social History Narrative   Married, no children.   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   No Tob.  No alc.  No drugs.   Orig from [place].   Takes xanax usually only 1 and 1/2 qhs, occ one during daytime Outpatient Prescriptions Prior to Visit  Medication Sig Dispense Refill  . ALPRAZolam (XANAX) 0.5 MG tablet TAKE ONE-HALF TO ONE TABLET BY MOUTH  THREE TIMES DAILY AS NEEDED  90 tablet  3  . Cholecalciferol (VITAMIN D) 2000 UNITS CAPS Take 2 capsules by mouth daily.       . Diphenhyd-Hydrocort-Nystatin (FIRST-DUKES MOUTHWASH) SUSP Take 1 tsp gargle and swallow four times daily as needed.  120 mL  5  . fish oil-omega-3 fatty acids 1000 MG capsule Take 2 g by mouth daily.       . fluticasone (FLONASE) 50 MCG/ACT nasal spray Place 2 sprays into both nostrils 2 (two) times daily.  16 g  2  . hydrocortisone 2.5 % cream Apply 1 application topically as needed. RECTAL ITCHING      . meloxicam (MOBIC) 7.5 MG tablet TAKE TWO TABLETS BY MOUTH EVERY DAY  180 tablet  1  . metoprolol tartrate (LOPRESSOR) 25 MG tablet Take 0.5 tablets (12.5 mg total) by mouth every morning.  30 tablet  5  . Multiple Vitamins-Iron (MULTIVITAMIN/IRON PO) Take 1 tablet by mouth daily.       . niacin (NIASPAN) 500 MG CR tablet Take 1 tablet (500 mg total) by mouth at bedtime.  90 tablet  3  . Oxycodone HCl 10 MG TABS Take 10 mg by mouth every 4 (four) hours.      . simvastatin (ZOCOR) 40 MG tablet Take 1 tablet (40 mg total) by mouth at bedtime.  30 tablet  5  . levothyroxine (SYNTHROID, LEVOTHROID) 50 MCG tablet 1/2 tab by mouth once daily  15 tablet  6  . sulfamethoxazole-trimethoprim (BACTRIM DS,SEPTRA DS) 800-160 MG per tablet Take 1 tablet by mouth 2 (two) times daily.  6 tablet  0   No facility-administered medications prior to visit.  Not taking bactrim any longer.  Allergies  Allergen Reactions  . Cymbalta [Duloxetine Hcl]     Facial edema  . Iohexol      Code: HIVES, Desc: pt states her face and throat swells when administered IV contrast - KB, Onset Date: [id number]   . Mepergan [Meperidine-Promethazine]     Cardiac arrest  . Augmentin [Amoxicillin-Pot Clavulanate] Nausea Only  . Citalopram Other (See Comments)    Jittery/heart racing   . Lexapro [Escitalopram Oxalate] Other (See Comments)    Jittery,heart racing  . Lyrica [Pregabalin] Swelling     Face, hands,  fingers, ankles  . Valsartan     REACTION: pt states INTOL to Diovan    ROS See HPI PE; Blood pressure 121/80, pulse 56, temperature 98.8 F (37.1 C), temperature source Temporal, height 5\' 6"  (1.676 m), weight 189 lb (85.73 kg), SpO2 93.00%. Gen: Alert, well appearing.  Patient is oriented to person, place, time, and situation. AFFECT: pleasant, lucid thought and speech. ZOX:WRUE: no injection, icteris, swelling, or exudate.  EOMI, PERRLA. Mouth: lips without lesion/swelling.  Oral mucosa pink and moist. Oropharynx without erythema, exudate, or swelling.  CV: RRR, no m/r/g.   LUNGS: CTA bilat, nonlabored resps, good aeration in all lung fields. Neck - No masses or thyromegaly or limitation in range of motion  Pertinent labs:  None today  ASSESSMENT AND PLAN:   Chronic fatigue fibromyalgia syndrome Discussed importance of diet, exercise, adequate sleep, work on stress reduction. Also will check CBC, CMET, TSH today.  Left-sided chest wall pain Likely part of her fibromyalgia syndrome. No symptoms worrisome for cardiopulmonary disease. Reassured patient. Of note, he screening mammogram is UTD.  Insomnia Insomnia, improved on xanax 0.5mg , 1 and 1/2 tabs po qhs.  An After Visit Summary was printed and given to the patient.  FOLLOW UP:  Return in about 6 months (around 08/16/2014) for cpe (fasting).

## 2014-02-13 NOTE — Progress Notes (Signed)
Pre visit review using our clinic review tool, if applicable. No additional management support is needed unless otherwise documented below in the visit note. 

## 2014-02-14 ENCOUNTER — Other Ambulatory Visit: Payer: Self-pay | Admitting: Family Medicine

## 2014-02-14 MED ORDER — LEVOTHYROXINE SODIUM 75 MCG PO CAPS: ORAL_CAPSULE | ORAL | Status: DC

## 2014-03-13 DIAGNOSIS — M25569 Pain in unspecified knee: Secondary | ICD-10-CM | POA: Diagnosis not present

## 2014-03-13 DIAGNOSIS — M76899 Other specified enthesopathies of unspecified lower limb, excluding foot: Secondary | ICD-10-CM | POA: Diagnosis not present

## 2014-03-20 DIAGNOSIS — Z96649 Presence of unspecified artificial hip joint: Secondary | ICD-10-CM | POA: Diagnosis not present

## 2014-03-20 DIAGNOSIS — T8489XA Other specified complication of internal orthopedic prosthetic devices, implants and grafts, initial encounter: Secondary | ICD-10-CM | POA: Diagnosis not present

## 2014-03-20 DIAGNOSIS — Z471 Aftercare following joint replacement surgery: Secondary | ICD-10-CM | POA: Diagnosis not present

## 2014-03-20 DIAGNOSIS — Z96659 Presence of unspecified artificial knee joint: Secondary | ICD-10-CM | POA: Diagnosis not present

## 2014-03-20 DIAGNOSIS — M25569 Pain in unspecified knee: Secondary | ICD-10-CM | POA: Diagnosis not present

## 2014-03-20 DIAGNOSIS — G8918 Other acute postprocedural pain: Secondary | ICD-10-CM | POA: Diagnosis not present

## 2014-03-26 DIAGNOSIS — F329 Major depressive disorder, single episode, unspecified: Secondary | ICD-10-CM | POA: Diagnosis not present

## 2014-03-26 DIAGNOSIS — M1710 Unilateral primary osteoarthritis, unspecified knee: Secondary | ICD-10-CM | POA: Diagnosis not present

## 2014-03-26 DIAGNOSIS — M961 Postlaminectomy syndrome, not elsewhere classified: Secondary | ICD-10-CM | POA: Diagnosis not present

## 2014-03-26 DIAGNOSIS — F3289 Other specified depressive episodes: Secondary | ICD-10-CM | POA: Diagnosis not present

## 2014-03-26 DIAGNOSIS — G894 Chronic pain syndrome: Secondary | ICD-10-CM | POA: Diagnosis not present

## 2014-03-28 ENCOUNTER — Other Ambulatory Visit (INDEPENDENT_AMBULATORY_CARE_PROVIDER_SITE_OTHER): Payer: Medicare Other

## 2014-03-28 DIAGNOSIS — E039 Hypothyroidism, unspecified: Secondary | ICD-10-CM

## 2014-03-28 LAB — TSH: TSH: 4.15 u[IU]/mL (ref 0.35–4.50)

## 2014-03-29 ENCOUNTER — Other Ambulatory Visit: Payer: Self-pay | Admitting: Family Medicine

## 2014-03-29 MED ORDER — LEVOTHYROXINE SODIUM 75 MCG PO CAPS: ORAL_CAPSULE | ORAL | Status: DC

## 2014-04-17 ENCOUNTER — Ambulatory Visit (INDEPENDENT_AMBULATORY_CARE_PROVIDER_SITE_OTHER): Payer: Medicare Other | Admitting: Gynecology

## 2014-04-17 ENCOUNTER — Encounter: Payer: Self-pay | Admitting: Gynecology

## 2014-04-17 DIAGNOSIS — N3000 Acute cystitis without hematuria: Secondary | ICD-10-CM | POA: Diagnosis not present

## 2014-04-17 DIAGNOSIS — N898 Other specified noninflammatory disorders of vagina: Secondary | ICD-10-CM

## 2014-04-17 DIAGNOSIS — N899 Noninflammatory disorder of vagina, unspecified: Secondary | ICD-10-CM | POA: Diagnosis not present

## 2014-04-17 LAB — WET PREP FOR TRICH, YEAST, CLUE
CLUE CELLS WET PREP: NONE SEEN
Trich, Wet Prep: NONE SEEN
Yeast Wet Prep HPF POC: NONE SEEN

## 2014-04-17 LAB — URINALYSIS W MICROSCOPIC + REFLEX CULTURE
BILIRUBIN URINE: NEGATIVE
CASTS: NONE SEEN
Crystals: NONE SEEN
Glucose, UA: NEGATIVE mg/dL
KETONES UR: NEGATIVE mg/dL
NITRITE: NEGATIVE
PH: 5 (ref 5.0–8.0)
Protein, ur: NEGATIVE mg/dL
Specific Gravity, Urine: 1.02 (ref 1.005–1.030)
UROBILINOGEN UA: 0.2 mg/dL (ref 0.0–1.0)

## 2014-04-17 MED ORDER — CIPROFLOXACIN HCL 250 MG PO TABS: 250.0000 mg | ORAL_TABLET | Freq: Two times a day (BID) | ORAL | Status: DC

## 2014-04-17 MED ORDER — FLUCONAZOLE 150 MG PO TABS: 150.0000 mg | ORAL_TABLET | Freq: Once | ORAL | Status: DC

## 2014-04-17 NOTE — Progress Notes (Signed)
[name] 06/07/1946 [id number]        67 y.o.  G0P0 presents complaining of vaginal itching of the last 2 weeks. Slight discharge. No odor. Also notes over the past week some pelvic discomfort and urinary frequency. Low-grade fever several days ago but resolved. No significant dysuria or urgency. No low back pain, nausea/vomiting or other constitutional symptoms.  Past medical history,surgical history, problem list, medications, allergies, family history and social history were all reviewed and documented in the EPIC chart.  Directed ROS with pertinent positives and negatives documented in the history of present illness/assessment and plan.  Exam: Kim assistant General appearance:  Normal Spine straight without CVA tenderness Abdomen soft nontender without masses guarding rebound organomegaly. Pelvic external BUS vagina with atrophic changes. Slight white discharge noted. Bimanual without masses or tenderness.  Assessment/Plan:  69 y.o. G0P0 with:  1. Urinalysis and symptoms to suggest UTI. Will treat with ciprofloxacin 250 mg twice a day x7 days. Followup if symptoms persist, worsen or recur. 2. Slight vaginal discharge with itching. Wet prep was negative but exam looks like a low-grade yeast. Will treat with Diflucan 150 mg x1 dose. Followup if symptoms persist, worsen or recur.   Note: This document was prepared with digital dictation and possible smart phrase technology. Any transcriptional errors that result from this process are unintentional.   Anastasio Auerbach MD, 10:17 AM 04/17/2014

## 2014-04-17 NOTE — Addendum Note (Signed)
Addended by: Nelva Nay on: 04/17/2014 10:35 AM   Modules accepted: Orders

## 2014-04-17 NOTE — Patient Instructions (Signed)
Take ciprofloxacin antibiotic twice daily for 7 days. Followup if symptoms persist, worsen or recur. Take Diflucan pill once.  Urinary Tract Infection Urinary tract infections (UTIs) can develop anywhere along your urinary tract. Your urinary tract is your body's drainage system for removing wastes and extra water. Your urinary tract includes two kidneys, two ureters, a bladder, and a urethra. Your kidneys are a pair of bean-shaped organs. Each kidney is about the size of your fist. They are located below your ribs, one on each side of your spine. CAUSES Infections are caused by microbes, which are microscopic organisms, including fungi, viruses, and bacteria. These organisms are so small that they can only be seen through a microscope. Bacteria are the microbes that most commonly cause UTIs. SYMPTOMS  Symptoms of UTIs may vary by age and gender of the patient and by the location of the infection. Symptoms in young women typically include a frequent and intense urge to urinate and a painful, burning feeling in the bladder or urethra during urination. Older women and men are more likely to be tired, shaky, and weak and have muscle aches and abdominal pain. A fever may mean the infection is in your kidneys. Other symptoms of a kidney infection include pain in your back or sides below the ribs, nausea, and vomiting. DIAGNOSIS To diagnose a UTI, your caregiver will ask you about your symptoms. Your caregiver also will ask to provide a urine sample. The urine sample will be tested for bacteria and white blood cells. White blood cells are made by your body to help fight infection. TREATMENT  Typically, UTIs can be treated with medication. Because most UTIs are caused by a bacterial infection, they usually can be treated with the use of antibiotics. The choice of antibiotic and length of treatment depend on your symptoms and the type of bacteria causing your infection. HOME CARE INSTRUCTIONS  If you were  prescribed antibiotics, take them exactly as your caregiver instructs you. Finish the medication even if you feel better after you have only taken some of the medication.  Drink enough water and fluids to keep your urine clear or pale yellow.  Avoid caffeine, tea, and carbonated beverages. They tend to irritate your bladder.  Empty your bladder often. Avoid holding urine for long periods of time.  Empty your bladder before and after sexual intercourse.  After a bowel movement, women should cleanse from front to back. Use each tissue only once. SEEK MEDICAL CARE IF:   You have back pain.  You develop a fever.  Your symptoms do not begin to resolve within 3 days. SEEK IMMEDIATE MEDICAL CARE IF:   You have severe back pain or lower abdominal pain.  You develop chills.  You have nausea or vomiting.  You have continued burning or discomfort with urination. MAKE SURE YOU:   Understand these instructions.  Will watch your condition.  Will get help right away if you are not doing well or get worse. Document Released: 06/16/2005 Document Revised: 03/07/2012 Document Reviewed: 10/15/2011 [facility] Patient Information 2015 [place], [place]. This information is not intended to replace advice given to you by your health care provider. Make sure you discuss any questions you have with your health care provider.

## 2014-04-19 LAB — URINE CULTURE: Colony Count: 2000

## 2014-04-20 DIAGNOSIS — M8580 Other specified disorders of bone density and structure, unspecified site: Secondary | ICD-10-CM

## 2014-04-20 HISTORY — DX: Other specified disorders of bone density and structure, unspecified site: M85.80

## 2014-04-25 ENCOUNTER — Ambulatory Visit (INDEPENDENT_AMBULATORY_CARE_PROVIDER_SITE_OTHER): Payer: Medicare Other

## 2014-04-25 DIAGNOSIS — M8580 Other specified disorders of bone density and structure, unspecified site: Secondary | ICD-10-CM

## 2014-04-25 DIAGNOSIS — M949 Disorder of cartilage, unspecified: Secondary | ICD-10-CM

## 2014-04-25 DIAGNOSIS — M899 Disorder of bone, unspecified: Secondary | ICD-10-CM

## 2014-04-29 DIAGNOSIS — N209 Urinary calculus, unspecified: Secondary | ICD-10-CM | POA: Diagnosis not present

## 2014-04-29 DIAGNOSIS — N3010 Interstitial cystitis (chronic) without hematuria: Secondary | ICD-10-CM | POA: Diagnosis not present

## 2014-05-03 DIAGNOSIS — M549 Dorsalgia, unspecified: Secondary | ICD-10-CM | POA: Diagnosis not present

## 2014-05-03 DIAGNOSIS — M961 Postlaminectomy syndrome, not elsewhere classified: Secondary | ICD-10-CM | POA: Diagnosis not present

## 2014-05-03 DIAGNOSIS — F329 Major depressive disorder, single episode, unspecified: Secondary | ICD-10-CM | POA: Diagnosis not present

## 2014-05-03 DIAGNOSIS — G894 Chronic pain syndrome: Secondary | ICD-10-CM | POA: Diagnosis not present

## 2014-05-03 DIAGNOSIS — R3129 Other microscopic hematuria: Secondary | ICD-10-CM | POA: Diagnosis not present

## 2014-05-03 DIAGNOSIS — M1710 Unilateral primary osteoarthritis, unspecified knee: Secondary | ICD-10-CM | POA: Diagnosis not present

## 2014-05-03 DIAGNOSIS — F3289 Other specified depressive episodes: Secondary | ICD-10-CM | POA: Diagnosis not present

## 2014-05-03 DIAGNOSIS — N3010 Interstitial cystitis (chronic) without hematuria: Secondary | ICD-10-CM | POA: Diagnosis not present

## 2014-05-06 ENCOUNTER — Encounter: Payer: Self-pay | Admitting: Gynecology

## 2014-05-13 ENCOUNTER — Telehealth: Payer: Self-pay | Admitting: Family Medicine

## 2014-05-13 DIAGNOSIS — E559 Vitamin D deficiency, unspecified: Secondary | ICD-10-CM

## 2014-05-13 NOTE — Telephone Encounter (Signed)
Pt aware.  Lab appointment scheduled.

## 2014-05-13 NOTE — Telephone Encounter (Signed)
Please advise 

## 2014-05-13 NOTE — Telephone Encounter (Signed)
Patients GYN doctor is requesting for her to have her Vit D levels checked, she would like to come here to have this done but she does not have an order, would Dr. [name] be willing to order this?

## 2014-05-13 NOTE — Telephone Encounter (Signed)
Vit D level ordered--future.

## 2014-05-14 DIAGNOSIS — M5137 Other intervertebral disc degeneration, lumbosacral region: Secondary | ICD-10-CM | POA: Diagnosis not present

## 2014-05-14 DIAGNOSIS — M76899 Other specified enthesopathies of unspecified lower limb, excluding foot: Secondary | ICD-10-CM | POA: Diagnosis not present

## 2014-05-15 ENCOUNTER — Other Ambulatory Visit: Payer: Medicare Other

## 2014-05-17 ENCOUNTER — Other Ambulatory Visit: Payer: Medicare Other

## 2014-05-29 ENCOUNTER — Ambulatory Visit: Payer: Medicare Other | Admitting: Family Medicine

## 2014-05-29 ENCOUNTER — Encounter: Payer: Self-pay | Admitting: Family Medicine

## 2014-05-29 ENCOUNTER — Ambulatory Visit (INDEPENDENT_AMBULATORY_CARE_PROVIDER_SITE_OTHER): Payer: Medicare Other | Admitting: Family Medicine

## 2014-05-29 VITALS — BP 142/84 | HR 56 | Temp 97.8°F | Resp 18 | Ht 66.0 in | Wt 190.0 lb

## 2014-05-29 DIAGNOSIS — I10 Essential (primary) hypertension: Secondary | ICD-10-CM

## 2014-05-29 DIAGNOSIS — F329 Major depressive disorder, single episode, unspecified: Secondary | ICD-10-CM

## 2014-05-29 DIAGNOSIS — E559 Vitamin D deficiency, unspecified: Secondary | ICD-10-CM

## 2014-05-29 DIAGNOSIS — G894 Chronic pain syndrome: Secondary | ICD-10-CM

## 2014-05-29 DIAGNOSIS — F32A Depression, unspecified: Secondary | ICD-10-CM

## 2014-05-29 DIAGNOSIS — F3289 Other specified depressive episodes: Secondary | ICD-10-CM | POA: Diagnosis not present

## 2014-05-29 LAB — VITAMIN D 25 HYDROXY (VIT D DEFICIENCY, FRACTURES): VITD: 33.33 ng/mL (ref 30.00–100.00)

## 2014-05-29 MED ORDER — BUPROPION HCL ER (XL) 150 MG PO TB24: 150.0000 mg | ORAL_TABLET | Freq: Every day | ORAL | Status: DC

## 2014-05-29 MED ORDER — HYDROCHLOROTHIAZIDE 25 MG PO TABS: 25.0000 mg | ORAL_TABLET | Freq: Every day | ORAL | Status: DC

## 2014-05-29 NOTE — Progress Notes (Signed)
Pre visit review using our clinic review tool, if applicable. No additional management support is needed unless otherwise documented below in the visit note. 

## 2014-05-29 NOTE — Progress Notes (Signed)
OFFICE VISIT  06/01/2014   CC:  Chief Complaint  Patient presents with  . Fatigue  . Hypertension    flucuating bp  . Altered Mental Status    confusion     HPI:    Patient is a 68 y.o. Caucasian female who presents accompanied by her husband who helps from time to time with giving history. She is here mainly for fatigue/lethargy/poor bp control per home monitoring, chronic pain syndrome.  She describes chronic depression that has rarely, if ever, really resonded to any meds.  She thinks maybe citalopram made the most positive effect on her but she can't recall why it is now listed on her "allergies" list.  She describes frequent cognitive "fog" characteristic of fibromyalgia syndrome, chronic fluctuating soft tissue pain primarily in upper back/shoulders/arms/neck.  Most debilitating pain,for which she sees a pain mgmt MD, is her chronic right knee pain for which surgery apparently is no longer a viable option. She takes 10-20 mg oxycodone (occ 30 mg per her husband) at least 2 times per day but likely 3 times per day most of the time.  She admits she hates taking all the meds she takes but also feels like pain is a constant factor in her life and she realizes she cannot live without her pain meds.  She does not get any significant activity during her day, in fact spends much of her day in bed or lying on couch.   She and husband describe wide fluctuations in her systolic bp: from 629 or so up to 180 at times, seems to be random and she says she can feel it in her ears like tinnitus when it is high.    Past Medical History  Diagnosis Date  . OSA (obstructive sleep apnea)     Dr. Halford Chessman  . Allergic rhinitis   . HTN (hypertension)   . Chest pain     cr  . Palpitations   . Hypercholesterolemia   . GERD (gastroesophageal reflux disease)   . Diverticulosis of colon   . History of colonic polyps   . DJD (degenerative joint disease)   . Lumbar back pain   . Fibromyalgia   . Osteopenia  04/2014    T score -2.0 X. 11%/1.8%  . Vitamin D deficiency   . Anxiety   . Depression   . Psoriasis   . Unspecified disorder of plasma protein metabolism     IgM kappa MGUS ([name]) routine f/u has shown stability of this problem.  . IC (interstitial cystitis)     [name] at [place]  . Iron deficiency anemia, unspecified 06/27/2013  . Memory impairment 10/03/2012    Memory impairment 10/03/2012 >referral to neuro    . Hypothyroid 07/05/2012    TSH ~5.92 >begin Synthroid 43mcg daily 07/05/2012  TSH 2.42 09/28/12 >decreased synthroid 21mcg daily    . FATIGUE / MALAISE 07/01/2009    Chronic  . Chronic renal insufficiency, stage II (mild) 2015    CrCl in th 60s.  . Chronic pain syndrome     right knee--[name]    Past Surgical History  Procedure Laterality Date  . Laminotomy  2010    L-4, L-5 FUSION  . Knee arthroscopy      RIGHT KNEE X 5  . Total knee arthroplasty  12-07 and 4-07    [name], revision 12-09 [name] at [facility]  . Patella fracture surgery  10-08    [name]  . Posterior laminectomy /  decompression lumbar spine  9-09    [name]  . Cholecystectomy    . Hand surgery      TRIGGER FINGER  . Hernia repair    . Abdominal hysterectomy      for prolapse with abdominal incision  . Oophorectomy  2010    Laparoscopic BSO (path benign)  . Esophagogastroduodenoscopy  05/29/2009    Esoph normal.  Gastritis (H pylori NEG) + gastric polyps (benign).  Duodenal biopsy NORMAL.  [name] Colonoscopy  04/2006    Diverticulosis, o/w normal.  . Nasal sinus surgery      polypectomy (remote past)    Outpatient Prescriptions Prior to Visit  Medication Sig Dispense Refill  . ALPRAZolam (XANAX) 0.5 MG tablet TAKE ONE-HALF TO ONE TABLET BY MOUTH THREE TIMES DAILY AS NEEDED  90 tablet  3  . Cholecalciferol (VITAMIN D) 2000 UNITS CAPS Take 2 capsules by mouth daily.       . Diphenhyd-Hydrocort-Nystatin (FIRST-DUKES MOUTHWASH) SUSP Take 1 tsp gargle and swallow four  times daily as needed.  120 mL  5  . fish oil-omega-3 fatty acids 1000 MG capsule Take 2 g by mouth daily.       . fluconazole (DIFLUCAN) 150 MG tablet Take 1 tablet (150 mg total) by mouth once.  1 tablet  0  . fluticasone (FLONASE) 50 MCG/ACT nasal spray Place 2 sprays into both nostrils 2 (two) times daily.  16 g  2  . hydrocortisone 2.5 % cream Apply 1 application topically as needed. RECTAL ITCHING      . Levothyroxine Sodium 75 MCG CAPS 1/2 tab po qd  45 capsule  1  . meloxicam (MOBIC) 7.5 MG tablet TAKE TWO TABLETS BY MOUTH EVERY DAY  180 tablet  1  . metoprolol tartrate (LOPRESSOR) 25 MG tablet Take 0.5 tablets (12.5 mg total) by mouth every morning.  30 tablet  5  . Multiple Vitamins-Iron (MULTIVITAMIN/IRON PO) Take 1 tablet by mouth daily.       . niacin (NIASPAN) 500 MG CR tablet Take 1 tablet (500 mg total) by mouth at bedtime.  90 tablet  3  . Oxycodone HCl 10 MG TABS Take 10 mg by mouth every 4 (four) hours.      . simvastatin (ZOCOR) 40 MG tablet Take 1 tablet (40 mg total) by mouth at bedtime.  30 tablet  5  . ciprofloxacin (CIPRO) 250 MG tablet Take 1 tablet (250 mg total) by mouth 2 (two) times daily. For 7 days  14 tablet  0   No facility-administered medications prior to visit.    Allergies  Allergen Reactions  . Cymbalta [Duloxetine Hcl]     Facial edema  . Iohexol      Code: HIVES, Desc: pt states her face and throat swells when administered IV contrast - KB, Onset Date: [id number]   . Mepergan [Meperidine-Promethazine]     Cardiac arrest  . Augmentin [Amoxicillin-Pot Clavulanate] Nausea Only  . Citalopram Other (See Comments)    Jittery/heart racing   . Lexapro [Escitalopram Oxalate] Other (See Comments)    Jittery,heart racing  . Lyrica [Pregabalin] Swelling    Face, hands, fingers, ankles  . Valsartan     REACTION: pt states INTOL to Diovan    ROS As per HPI  PE: Blood pressure 142/84, pulse 56, temperature 97.8 F (36.6 C), temperature source Oral,  resp. rate 18, height 5\' 6"  (1.676 m), weight 190 lb (86.183 kg), SpO2 96.00%. Gen: alert, tired appearing, tears-up at times during  interview.  She is in NAD.  She moves slowly and limps from chair to exam table.  AFFECT: pleasant but morose/sad at times, occ smiles.  Lucid thought and speech displayed. IOE:VOJJ: no injection, icteris, swelling, or exudate.  EOMI, PERRLA. Mouth: lips without lesion/swelling.  Oral mucosa pink and moist. Oropharynx without erythema, exudate, or swelling.  CV: RRR, no m/r/g.   LUNGS: CTA bilat, nonlabored resps, good aeration in all lung fields. ABD: soft, ND. EXT: no c/c/e SKIN: no pallor, jaundice, or rash  LABS:  None today Recent:    Chemistry      Component Value Date/Time   NA 140 02/13/2014 1131   NA 145 01/15/2010 0835   K 4.1 02/13/2014 1131   K 4.2 01/15/2010 0835   CL 106 02/13/2014 1131   CL 103 01/15/2010 0835   CO2 28 02/13/2014 1131   CO2 30 01/15/2010 0835   BUN 12 02/13/2014 1131   BUN 16 01/15/2010 0835   CREATININE 1.1 02/13/2014 1131   CREATININE 0.9 01/15/2010 0835      Component Value Date/Time   CALCIUM 9.1 02/13/2014 1131   CALCIUM 9.9 01/15/2010 0835   ALKPHOS 74 02/13/2014 1131   ALKPHOS 109* 01/15/2010 0835   AST 20 02/13/2014 1131   AST 18 01/15/2010 0835   ALT 23 02/13/2014 1131   ALT 13 01/15/2010 0835   BILITOT 0.4 02/13/2014 1131   BILITOT 0.60 01/15/2010 0835     Lab Results  Component Value Date   TSH 4.15 03/28/2014   Lab Results  Component Value Date   WBC 6.1 02/13/2014   HGB 12.7 02/13/2014   HCT 37.9 02/13/2014   MCV 85.3 02/13/2014   PLT 209.0 02/13/2014   IMPRESSION AND PLAN:  Uncontrolled hypertension Fluctuations possibly related to periods of increased pain: Add HCTZ 25mg  qd and continue lopressor 1/2 of 25mg  tab bid. Check BMET at f/u in 2 wks.  Chronic pain syndrome Continue pain mgmt clinic/narcotics as per their rx's. Strongly encouraged pt to get up and be a bit more active during her day even if it  increases her pain some, as it will pay off in the long run (esp in regard to her chronic fatigue). She agreed to try.  Depression Start wellbutrin XL 150mg  qd. Therapeutic expectations and side effect profile of medication discussed today.  Patient's questions answered.   VITAMIN D DEFICIENCY Recheck Vit D level today. For now she'll continue 4000 IU vit D qd.   An After Visit Summary was printed and given to the patient.  FOLLOW UP: Return in about 2 weeks (around 06/12/2014) for f/u HTN, depression.

## 2014-05-31 DIAGNOSIS — F3289 Other specified depressive episodes: Secondary | ICD-10-CM | POA: Diagnosis not present

## 2014-05-31 DIAGNOSIS — G894 Chronic pain syndrome: Secondary | ICD-10-CM | POA: Diagnosis not present

## 2014-05-31 DIAGNOSIS — F329 Major depressive disorder, single episode, unspecified: Secondary | ICD-10-CM | POA: Diagnosis not present

## 2014-05-31 DIAGNOSIS — M961 Postlaminectomy syndrome, not elsewhere classified: Secondary | ICD-10-CM | POA: Diagnosis not present

## 2014-05-31 DIAGNOSIS — M1710 Unilateral primary osteoarthritis, unspecified knee: Secondary | ICD-10-CM | POA: Diagnosis not present

## 2014-06-01 DIAGNOSIS — F32A Depression, unspecified: Secondary | ICD-10-CM | POA: Insufficient documentation

## 2014-06-01 DIAGNOSIS — F329 Major depressive disorder, single episode, unspecified: Secondary | ICD-10-CM | POA: Insufficient documentation

## 2014-06-01 DIAGNOSIS — I10 Essential (primary) hypertension: Secondary | ICD-10-CM | POA: Insufficient documentation

## 2014-06-01 NOTE — Assessment & Plan Note (Signed)
Start wellbutrin XL 150mg  qd. Therapeutic expectations and side effect profile of medication discussed today.  Patient's questions answered.

## 2014-06-01 NOTE — Assessment & Plan Note (Signed)
Fluctuations possibly related to periods of increased pain: Add HCTZ 25mg  qd and continue lopressor 1/2 of 25mg  tab bid. Check BMET at f/u in 2 wks.

## 2014-06-01 NOTE — Assessment & Plan Note (Addendum)
Recheck Vit D level today. For now she'll continue 4000 IU vit D qd.

## 2014-06-01 NOTE — Assessment & Plan Note (Addendum)
Continue pain mgmt clinic/narcotics as per their rx's. Strongly encouraged pt to get up and be a bit more active during her day even if it increases her pain some, as it will pay off in the long run (esp in regard to her chronic fatigue). She agreed to try.

## 2014-06-03 ENCOUNTER — Telehealth: Payer: Self-pay | Admitting: Family Medicine

## 2014-06-03 NOTE — Telephone Encounter (Signed)
Pt LMOM stating that she believes the HCTZ is causing her to feel lethargic.  Please advise.

## 2014-06-04 NOTE — Telephone Encounter (Signed)
Noted  

## 2014-06-04 NOTE — Telephone Encounter (Signed)
Patient states her BP yesterday was 168/86 w/ 72 pulse w/ taking hctz 3 hours prior to checking bp.  This am 128/76 w/ 68 pulse without taking hctz.  Patient states that she will continue to take HCTZ and she will start taking ativan at QHS instead of in the am.  Patient will contact me on Friday to report am and pm BP / HR readings.

## 2014-06-04 NOTE — Telephone Encounter (Signed)
Tell her that the HCTZ should not make her lethargic.  I recommend she continue taking it. Has she checked her bp since starting it?  Let me know-thx

## 2014-06-04 NOTE — Telephone Encounter (Signed)
LMOM for pt to CB.  

## 2014-06-07 NOTE — Telephone Encounter (Signed)
HCTZ is causing pt to urinate so much that it is causing her to be raw and she said she can't get enough to drink she just feelts completely dried out.  Patient would like to stop HCTZ and go back on half metoprolol.  Please advise.   BP readings while on HCTZ: 9/15 9:20 128/76 68P   7:30 135/75 75p 9/16 5am 126/77 63P  7pm 146/77 79P 9/17 7"30 am 138/82 68P 8pm 144/85 65P

## 2014-06-10 NOTE — Telephone Encounter (Signed)
Yes,  She was taking 1/2 tab of 25mg .

## 2014-06-10 NOTE — Telephone Encounter (Signed)
OK, she may restart lopressor at previous dosing (pls confirm with her, but I think she was only taking 1/2 of 25mg  lopressor once in the morning).-thx

## 2014-06-11 DIAGNOSIS — M76899 Other specified enthesopathies of unspecified lower limb, excluding foot: Secondary | ICD-10-CM | POA: Diagnosis not present

## 2014-06-13 ENCOUNTER — Telehealth: Payer: Self-pay

## 2014-06-13 MED ORDER — FOSINOPRIL SODIUM 10 MG PO TABS: 10.0000 mg | ORAL_TABLET | Freq: Every day | ORAL | Status: DC

## 2014-06-13 NOTE — Telephone Encounter (Signed)
Pt called stating she is worried that her blood pressure is going up. Yesterday at 152/68, 144/82, 161/87, and this am at 173/85. She also states her mouth is really dry and she is going to the bathroom a lot. She would like to know what she should do. She has an appt next week to follow up.

## 2014-06-13 NOTE — Telephone Encounter (Signed)
Spoke with pt, advised message from Dr [name]. Pt understood. i sent in medication to her pharmacy.

## 2014-06-13 NOTE — Telephone Encounter (Signed)
Pls call pt and tell her to continue taking all current meds and I want to add a new blood pressure medication called fosinopril 10mg  once daily (pls eRx this med, #30, no RF).  Have her continue to check her bp AND heart rate 1-2 times per day and write them down and bring in for my review at next visit.-thx

## 2014-06-19 ENCOUNTER — Ambulatory Visit (INDEPENDENT_AMBULATORY_CARE_PROVIDER_SITE_OTHER): Payer: Medicare Other | Admitting: Family Medicine

## 2014-06-19 ENCOUNTER — Encounter: Payer: Self-pay | Admitting: Family Medicine

## 2014-06-19 VITALS — BP 134/85 | HR 65 | Temp 97.7°F | Resp 18 | Ht 66.0 in | Wt 187.0 lb

## 2014-06-19 DIAGNOSIS — Z23 Encounter for immunization: Secondary | ICD-10-CM

## 2014-06-19 DIAGNOSIS — I10 Essential (primary) hypertension: Secondary | ICD-10-CM

## 2014-06-19 DIAGNOSIS — F329 Major depressive disorder, single episode, unspecified: Secondary | ICD-10-CM | POA: Diagnosis not present

## 2014-06-19 DIAGNOSIS — F3289 Other specified depressive episodes: Secondary | ICD-10-CM

## 2014-06-19 DIAGNOSIS — F32A Depression, unspecified: Secondary | ICD-10-CM

## 2014-06-19 NOTE — Progress Notes (Signed)
Pre visit review using our clinic review tool, if applicable. No additional management support is needed unless otherwise documented below in the visit note. 

## 2014-06-19 NOTE — Progress Notes (Signed)
OFFICE NOTE  06/19/2014  CC:  Chief Complaint  Patient presents with  . Follow-up   HPI: Patient is a 67 y.o. Caucasian female who is here for 2 week f/u HTN and depression. She could not tolerate trial of HCTZ so she is back on metoprolol 1/2 25mg  tab once a day. Home bp's normal range.  HR in high 50s, low 60s.  We started wellbutrin XL 150mg  qd last visit.  No side effects.  She finds she does not cry as much, but othewise feels no changes in mood/affect/energy/anxiety.  Spent brief period in counseling in the past and says she was told that she doesn't need it and she agrees.    She is still finding it hard to get into habit of doing CV exercise.  She used to do this at the [facility] but due to scheduling probs and husband's med issues --she has multiple excuses.   Pertinent PMH:  Past medical, surgical, social, and family history reviewed and no changes are noted since last office visit.  MEDS:  Outpatient Prescriptions Prior to Visit  Medication Sig Dispense Refill  . ALPRAZolam (XANAX) 0.5 MG tablet TAKE ONE-HALF TO ONE TABLET BY MOUTH THREE TIMES DAILY AS NEEDED  90 tablet  3  . buPROPion (WELLBUTRIN XL) 150 MG 24 hr tablet Take 1 tablet (150 mg total) by mouth daily.  30 tablet  1  . Cholecalciferol (VITAMIN D) 2000 UNITS CAPS Take 2 capsules by mouth daily.       . Diphenhyd-Hydrocort-Nystatin (FIRST-DUKES MOUTHWASH) SUSP Take 1 tsp gargle and swallow four times daily as needed.  120 mL  5  . fish oil-omega-3 fatty acids 1000 MG capsule Take 2 g by mouth daily.       . fluticasone (FLONASE) 50 MCG/ACT nasal spray Place 2 sprays into both nostrils 2 (two) times daily.  16 g  2  . fosinopril (MONOPRIL) 10 MG tablet Take 1 tablet (10 mg total) by mouth daily.  30 tablet  0  . hydrocortisone 2.5 % cream Apply 1 application topically as needed. RECTAL ITCHING      . Levothyroxine Sodium 75 MCG CAPS 1/2 tab po qd  45 capsule  1  . meloxicam (MOBIC) 7.5 MG tablet TAKE TWO TABLETS BY  MOUTH EVERY DAY  180 tablet  1  . metoprolol tartrate (LOPRESSOR) 25 MG tablet Take 0.5 tablets (12.5 mg total) by mouth every morning.  30 tablet  5  . Multiple Vitamins-Iron (MULTIVITAMIN/IRON PO) Take 1 tablet by mouth daily.       . niacin (NIASPAN) 500 MG CR tablet Take 1 tablet (500 mg total) by mouth at bedtime.  90 tablet  3  . Oxycodone HCl 10 MG TABS Take 10 mg by mouth every 4 (four) hours.      . simvastatin (ZOCOR) 40 MG tablet Take 1 tablet (40 mg total) by mouth at bedtime.  30 tablet  5  . fluconazole (DIFLUCAN) 150 MG tablet Take 1 tablet (150 mg total) by mouth once.  1 tablet  0   No facility-administered medications prior to visit.    PE: Blood pressure 134/85, pulse 65, temperature 97.7 F (36.5 C), temperature source Temporal, resp. rate 18, height 5\' 6"  (1.676 m), weight 187 lb (84.823 kg), SpO2 95.00%. Gen: Alert, well appearing.  Patient is oriented to person, place, time, and situation. No further exam today.  LAB: none today  IMPRESSION AND PLAN:  1) HTN, stable on current med regimen.  2)  Depression: stable.  Possibly subtle/mild improvement at this early point on wellbutrin XL 150mg  dosing. Continue current dosing.   STRONGLY ENCOURAGED REGULAR CV EXERCISE: I told her to stop making excuses for herself and she said ok.  3) Preventative health care: flu IM today. She is open to prevnar 13 but wants to defer this until next f/u set for November this year.  4) Chronic pain/fibromyalgia and chronic right knee pain: pt feels like her oxycodone is making her have various side effects and she'll try again to discuss this with her pain mgmt MD in the near future.  An After Visit Summary was printed and given to the patient.  FOLLOW UP: keep appt already set for November this year

## 2014-06-20 ENCOUNTER — Telehealth: Payer: Self-pay | Admitting: Family Medicine

## 2014-06-20 NOTE — Telephone Encounter (Signed)
emmi emailed °

## 2014-06-24 ENCOUNTER — Telehealth: Payer: Self-pay | Admitting: *Deleted

## 2014-06-24 MED ORDER — PROMETHAZINE HCL 25 MG PO TABS: 25.0000 mg | ORAL_TABLET | Freq: Four times a day (QID) | ORAL | Status: DC | PRN

## 2014-06-24 NOTE — Telephone Encounter (Signed)
See call-a-nurse message.

## 2014-06-24 NOTE — Telephone Encounter (Signed)
Patient Information:  Caller Name: Seana  Phone: [phone]  Patient: [name], [name]  Gender: Female  DOB: 1946/03/04  Age: 68 Years  PCP: Ricardo Jericho [facility])  Office Follow Up:  Does the office need to follow up with this patient?: No  Instructions For The Office: N/A  RN Note:  Called office, spoke to nurse who will discuss situation with Dr.McGowen and call pt back.  Symptoms  Reason For Call & Symptoms: Calling about severe dizziness when standing(room was spinning) x 1, nausea, had cold sweats, almost passed out on the toilet. BP 148/78 HR 72 and now BP 148/72 HR 71 while laying down. Is laying down now but no longer dizzy when she sits up just very nauseous(husband has to help her sit up). Saw Dr. [name] last week but no change in meds.  Reviewed Health History In EMR: Yes  Reviewed Medications In EMR: Yes  Reviewed Allergies In EMR: Yes  Reviewed Surgeries / Procedures: Yes  Date of Onset of Symptoms: 06/24/2014  Guideline(s) Used:  Dizziness  Disposition Per Guideline:   Go to ED Now (or to Office with PCP Approval)  Reason For Disposition Reached:   Patient sounds very sick or weak to the triager  Advice Given:  N/A  Patient Will Follow Care Advice:  YES

## 2014-06-25 ENCOUNTER — Ambulatory Visit: Payer: Self-pay | Admitting: Family Medicine

## 2014-06-25 ENCOUNTER — Telehealth: Payer: Self-pay | Admitting: *Deleted

## 2014-06-25 ENCOUNTER — Telehealth: Payer: Self-pay | Admitting: Family Medicine

## 2014-06-25 MED ORDER — MECLIZINE HCL 25 MG PO TABS: 25.0000 mg | ORAL_TABLET | Freq: Four times a day (QID) | ORAL | Status: DC | PRN

## 2014-06-25 NOTE — Telephone Encounter (Signed)
Patient Information:  Caller Name: [name]  Phone: [phone]  Patient: [name], [name]  Gender: Female  DOB: May 04, 1946  Age: 68 Years  PCP: Ricardo Jericho [facility])  Office Follow Up:  Does the office need to follow up with this patient?: No  Instructions For The Office: N/A   Symptoms  Reason For Call & Symptoms: Pt is calling to say she was prescribed Phenergan yesterday for nausea.She takes Phenergan 25mg  po q 6h. Pt is calling back this am to say she is very dizzy - onset  this am. Pt has used Phenergan in the past without this type of a side effect noted. She is afeb. The dizziness is present whether lying down or standing. Pt is hydrated and has urinated several times recently.  Reviewed Health History In EMR: Yes  Reviewed Medications In EMR: Yes  Reviewed Allergies In EMR: Yes  Reviewed Surgeries / Procedures: Yes  Date of Onset of Symptoms: 06/25/2014  Guideline(s) Used:  Dizziness  Disposition Per Guideline:   Go to Office Now  Reason For Disposition Reached:   Spinning or tilting sensation (vertigo) present now  Advice Given:  N/A  Patient Will Follow Care Advice:  YES  Appointment Scheduled:  06/25/2014 11:00:00 Appointment Scheduled Provider:  Ricardo Jericho (Family Practice)

## 2014-06-25 NOTE — Telephone Encounter (Signed)
Patient has an appt at 11 am this morning. Patient called office to see if you would send her in a med for dizziness because she doesn't think she will be able to make it to the office. Please advise?

## 2014-06-25 NOTE — Telephone Encounter (Signed)
Agree, Noted.

## 2014-06-25 NOTE — Telephone Encounter (Signed)
Per Dr.McGowen, ok to sent in meclizine 25 mg #30 x3. Rx sent to pharmacy and pt aware.

## 2014-06-26 ENCOUNTER — Other Ambulatory Visit: Payer: Medicare Other | Admitting: Lab

## 2014-06-26 ENCOUNTER — Ambulatory Visit: Payer: Medicare Other | Admitting: Hematology & Oncology

## 2014-07-01 ENCOUNTER — Ambulatory Visit (INDEPENDENT_AMBULATORY_CARE_PROVIDER_SITE_OTHER): Payer: Medicare Other | Admitting: Family Medicine

## 2014-07-01 ENCOUNTER — Encounter: Payer: Self-pay | Admitting: Family Medicine

## 2014-07-01 VITALS — BP 130/86 | HR 74 | Temp 99.0°F | Resp 18 | Ht 66.0 in | Wt 187.0 lb

## 2014-07-01 DIAGNOSIS — J018 Other acute sinusitis: Secondary | ICD-10-CM

## 2014-07-01 DIAGNOSIS — H8110 Benign paroxysmal vertigo, unspecified ear: Secondary | ICD-10-CM | POA: Diagnosis not present

## 2014-07-01 DIAGNOSIS — I10 Essential (primary) hypertension: Secondary | ICD-10-CM

## 2014-07-01 MED ORDER — CEFUROXIME AXETIL 500 MG PO TABS: ORAL_TABLET | ORAL | Status: DC

## 2014-07-01 NOTE — Progress Notes (Signed)
OFFICE NOTE  07/01/2014  CC:  Chief Complaint  Patient presents with  . Dizziness  . Nausea  . Headache    mainly pressure  . Otalgia    R sided   HPI: Patient is a 68 y.o. Caucasian female who is here for 2 week f/u. We've been trying to address her HTN and recent dizziness issues.  Fibromyalgia and fatigue are chronic concerns that are changed very little by any therapies/meds.  One week ago she had acute onset of vertigo on getting out of bed in the morning, had nausea with it.  Still has some sx's with certain head positions.  She is not sure if meclizine is helping.  Phenergan helps her nausea.  Feels pressure in head, head over frontal sinus areas, eyes sore, right ear hurting.  She does feel like she has nasal congestion/sinus pressure x 1 wk.  No fever. BP's at home normal.  No significant cough.  No wheezing, no CP, no SOB.  Pertinent PMH:  Past medical, surgical, social, and family history reviewed and no changes are noted since last office visit.  MEDS:  Outpatient Prescriptions Prior to Visit  Medication Sig Dispense Refill  . ALPRAZolam (XANAX) 0.5 MG tablet TAKE ONE-HALF TO ONE TABLET BY MOUTH THREE TIMES DAILY AS NEEDED  90 tablet  3  . buPROPion (WELLBUTRIN XL) 150 MG 24 hr tablet Take 1 tablet (150 mg total) by mouth daily.  30 tablet  1  . Cholecalciferol (VITAMIN D) 2000 UNITS CAPS Take 2 capsules by mouth daily.       . fish oil-omega-3 fatty acids 1000 MG capsule Take 2 g by mouth daily.       . fluticasone (FLONASE) 50 MCG/ACT nasal spray Place 2 sprays into both nostrils 2 (two) times daily.  16 g  2  . fosinopril (MONOPRIL) 10 MG tablet Take 1 tablet (10 mg total) by mouth daily.  30 tablet  0  . Levothyroxine Sodium 75 MCG CAPS 1/2 tab po qd  45 capsule  1  . meclizine (ANTIVERT) 25 MG tablet Take 1 tablet (25 mg total) by mouth every 6 (six) hours as needed for dizziness.  30 tablet  3  . meloxicam (MOBIC) 7.5 MG tablet TAKE TWO TABLETS BY MOUTH EVERY DAY   180 tablet  1  . metoprolol tartrate (LOPRESSOR) 25 MG tablet Take 0.5 tablets (12.5 mg total) by mouth every morning.  30 tablet  5  . Multiple Vitamins-Iron (MULTIVITAMIN/IRON PO) Take 1 tablet by mouth daily.       . niacin (NIASPAN) 500 MG CR tablet Take 1 tablet (500 mg total) by mouth at bedtime.  90 tablet  3  . Oxycodone HCl 10 MG TABS Take 10 mg by mouth every 4 (four) hours.      . promethazine (PHENERGAN) 25 MG tablet Take 1 tablet (25 mg total) by mouth every 6 (six) hours as needed for nausea or vomiting.  30 tablet  1  . simvastatin (ZOCOR) 40 MG tablet Take 1 tablet (40 mg total) by mouth at bedtime.  30 tablet  5  . Diphenhyd-Hydrocort-Nystatin (FIRST-DUKES MOUTHWASH) SUSP Take 1 tsp gargle and swallow four times daily as needed.  120 mL  5  . fluconazole (DIFLUCAN) 150 MG tablet Take 1 tablet (150 mg total) by mouth once.  1 tablet  0  . hydrocortisone 2.5 % cream Apply 1 application topically as needed. RECTAL ITCHING       No facility-administered medications prior  to visit.    PE: Blood pressure 130/86, pulse 74, temperature 99 F (37.2 C), temperature source Temporal, resp. rate 18, height 5\' 6"  (1.676 m), weight 187 lb (84.823 kg), SpO2 96.00%. VS: noted--normal. Gen: alert, NAD, NONTOXIC APPEARING. HEENT: eyes without injection, drainage, or swelling.  Ears: EACs clear, TMs with normal light reflex and landmarks.  Nose: Clear rhinorrhea, with some dried, crusty exudate adherent to mildly injected mucosa.  No purulent d/c.  Mild paranasal sinus TTP.  No facial swelling.  Throat and mouth without focal lesion.  No pharyngial swelling, erythema, or exudate.   Neck: supple, no LAD.   LUNGS: CTA bilat, nonlabored resps.   CV: RRR, no m/r/g. EXT: no c/c/e SKIN: no rash Dix-Halpike maneuver in office today was neg for vertigo or nystagmus bilat. Pt had mild orthostatic lightheadedness when she went from supine to sitting up position--this lasted only a few  seconds.  IMPRESSION AND PLAN:  1) HTN; The current medical regimen is effective;  continue present plan and medications.  2) Acute sinusitis: ceftin 500 mg bid x 10d.  3) BPPV: resolving.  Home epley's maneuvers handout reviewed with pt and given to her to take home. If prolonged sx's then will get her into vestibular rehab.  An After Visit Summary was printed and given to the patient.  FOLLOW UP: 37mo

## 2014-07-01 NOTE — Progress Notes (Signed)
Pre visit review using our clinic review tool, if applicable. No additional management support is needed unless otherwise documented below in the visit note. 

## 2014-07-05 DIAGNOSIS — Z79891 Long term (current) use of opiate analgesic: Secondary | ICD-10-CM | POA: Diagnosis not present

## 2014-07-05 DIAGNOSIS — G894 Chronic pain syndrome: Secondary | ICD-10-CM | POA: Diagnosis not present

## 2014-07-05 DIAGNOSIS — M25561 Pain in right knee: Secondary | ICD-10-CM | POA: Diagnosis not present

## 2014-07-05 DIAGNOSIS — F4321 Adjustment disorder with depressed mood: Secondary | ICD-10-CM | POA: Diagnosis not present

## 2014-07-05 DIAGNOSIS — M961 Postlaminectomy syndrome, not elsewhere classified: Secondary | ICD-10-CM | POA: Diagnosis not present

## 2014-07-08 ENCOUNTER — Other Ambulatory Visit: Payer: Self-pay | Admitting: Family Medicine

## 2014-07-08 MED ORDER — METOPROLOL TARTRATE 25 MG PO TABS: 12.5000 mg | ORAL_TABLET | Freq: Every morning | ORAL | Status: DC

## 2014-07-15 ENCOUNTER — Other Ambulatory Visit: Payer: Self-pay | Admitting: *Deleted

## 2014-07-15 ENCOUNTER — Other Ambulatory Visit: Payer: Self-pay | Admitting: Gynecology

## 2014-07-15 DIAGNOSIS — Z1231 Encounter for screening mammogram for malignant neoplasm of breast: Secondary | ICD-10-CM

## 2014-07-15 MED ORDER — FOSINOPRIL SODIUM 10 MG PO TABS: 10.0000 mg | ORAL_TABLET | Freq: Every day | ORAL | Status: DC

## 2014-07-18 ENCOUNTER — Telehealth: Payer: Self-pay | Admitting: Family Medicine

## 2014-07-18 DIAGNOSIS — J3089 Other allergic rhinitis: Secondary | ICD-10-CM

## 2014-07-18 DIAGNOSIS — J0141 Acute recurrent pansinusitis: Secondary | ICD-10-CM

## 2014-07-18 MED ORDER — BUDESONIDE 32 MCG/ACT NA SUSP: 2.0000 | Freq: Every day | NASAL | Status: DC

## 2014-07-18 MED ORDER — AZELASTINE-FLUTICASONE 137-50 MCG/ACT NA SUSP: NASAL | Status: DC

## 2014-07-18 NOTE — Telephone Encounter (Signed)
Please advise 

## 2014-07-18 NOTE — Telephone Encounter (Signed)
Patient will go to [facility].

## 2014-07-18 NOTE — Telephone Encounter (Signed)
Insurance denied coverage of dymista so I sent in rx for rhinocort AQUA.

## 2014-07-18 NOTE — Telephone Encounter (Signed)
Caller: [name]/Patient; Phone: [phone]; Reason for Call: Pt states she was seen earlier this month for vertigo and sinusitis.  States she completed her course of abx and is taking her prescribed med for vertigo.  States her vertigo is definitely improved (although not resolved) but the congestion in her head does not feel improved at all and still having the occasional ear ache also.  No new sxs or worsening sxs for triage.  She just wants to make the doctor aware that the sinus pressure is the same and she would like to know if he will prescribe a Zpack.  Assured her will send message per her request.  She states she may not be home to receive the call back but she states it is ok to leave a detailed message on her vm.

## 2014-07-18 NOTE — Telephone Encounter (Signed)
No abx at this time. I want her to get a sinus x-ray (ask if [facility] ok and then I'll order it). Also, have her stop her flonase and I'll send in a new nasal spray to her pharmacy that she needs to take [facility]. -thx

## 2014-07-18 NOTE — Telephone Encounter (Signed)
Sinus x-ray order entered for [facility].

## 2014-07-18 NOTE — Telephone Encounter (Signed)
Left detailed message on machine for pt but asked her to CB to let us know where she wanted x-ray to be performed.

## 2014-07-19 ENCOUNTER — Other Ambulatory Visit: Payer: Self-pay | Admitting: Family Medicine

## 2014-07-19 ENCOUNTER — Ambulatory Visit (HOSPITAL_BASED_OUTPATIENT_CLINIC_OR_DEPARTMENT_OTHER)
Admission: RE | Admit: 2014-07-19 | Discharge: 2014-07-19 | Disposition: A | Discharge status: Home / Self Care | Payer: Medicare Other | Source: Ambulatory Visit | Attending: Family Medicine | Admitting: Family Medicine

## 2014-07-19 DIAGNOSIS — J3089 Other allergic rhinitis: Secondary | ICD-10-CM

## 2014-07-19 DIAGNOSIS — J0141 Acute recurrent pansinusitis: Secondary | ICD-10-CM | POA: Insufficient documentation

## 2014-07-19 DIAGNOSIS — J3489 Other specified disorders of nose and nasal sinuses: Secondary | ICD-10-CM | POA: Diagnosis not present

## 2014-07-19 MED ORDER — AZELASTINE HCL 0.15 % NA SOLN: NASAL | Status: AC

## 2014-07-30 ENCOUNTER — Ambulatory Visit (HOSPITAL_COMMUNITY): Payer: Medicare Other

## 2014-08-02 ENCOUNTER — Other Ambulatory Visit: Payer: Self-pay | Admitting: *Deleted

## 2014-08-02 DIAGNOSIS — D509 Iron deficiency anemia, unspecified: Secondary | ICD-10-CM

## 2014-08-02 DIAGNOSIS — G894 Chronic pain syndrome: Secondary | ICD-10-CM | POA: Diagnosis not present

## 2014-08-02 DIAGNOSIS — E8809 Other disorders of plasma-protein metabolism, not elsewhere classified: Secondary | ICD-10-CM

## 2014-08-02 DIAGNOSIS — M25561 Pain in right knee: Secondary | ICD-10-CM | POA: Diagnosis not present

## 2014-08-02 DIAGNOSIS — K5900 Constipation, unspecified: Secondary | ICD-10-CM | POA: Diagnosis not present

## 2014-08-02 DIAGNOSIS — M961 Postlaminectomy syndrome, not elsewhere classified: Secondary | ICD-10-CM | POA: Diagnosis not present

## 2014-08-05 ENCOUNTER — Other Ambulatory Visit: Payer: Self-pay | Admitting: Family Medicine

## 2014-08-05 ENCOUNTER — Telehealth: Payer: Self-pay | Admitting: *Deleted

## 2014-08-05 ENCOUNTER — Encounter: Payer: Self-pay | Admitting: Hematology & Oncology

## 2014-08-05 ENCOUNTER — Ambulatory Visit (HOSPITAL_BASED_OUTPATIENT_CLINIC_OR_DEPARTMENT_OTHER): Payer: Medicare Other | Admitting: Hematology & Oncology

## 2014-08-05 ENCOUNTER — Encounter: Payer: Self-pay | Admitting: *Deleted

## 2014-08-05 ENCOUNTER — Other Ambulatory Visit (HOSPITAL_BASED_OUTPATIENT_CLINIC_OR_DEPARTMENT_OTHER): Payer: Medicare Other | Admitting: Lab

## 2014-08-05 ENCOUNTER — Other Ambulatory Visit: Payer: Self-pay | Admitting: *Deleted

## 2014-08-05 VITALS — BP 119/78 | HR 77 | Temp 97.7°F | Resp 14 | Ht 66.0 in | Wt 189.0 lb

## 2014-08-05 DIAGNOSIS — E8809 Other disorders of plasma-protein metabolism, not elsewhere classified: Secondary | ICD-10-CM

## 2014-08-05 DIAGNOSIS — D509 Iron deficiency anemia, unspecified: Secondary | ICD-10-CM

## 2014-08-05 DIAGNOSIS — D472 Monoclonal gammopathy: Secondary | ICD-10-CM

## 2014-08-05 LAB — CBC WITH DIFFERENTIAL (CANCER CENTER ONLY)
BASO#: 0 10*3/uL (ref 0.0–0.2)
BASO%: 0.4 % (ref 0.0–2.0)
EOS%: 6.6 % (ref 0.0–7.0)
Eosinophils Absolute: 0.5 10*3/uL (ref 0.0–0.5)
HEMATOCRIT: 37.8 % (ref 34.8–46.6)
HEMOGLOBIN: 12.7 g/dL (ref 11.6–15.9)
LYMPH#: 2.5 10*3/uL (ref 0.9–3.3)
LYMPH%: 34.3 % (ref 14.0–48.0)
MCH: 29.1 pg (ref 26.0–34.0)
MCHC: 33.6 g/dL (ref 32.0–36.0)
MCV: 87 fL (ref 81–101)
MONO#: 0.4 10*3/uL (ref 0.1–0.9)
MONO%: 5 % (ref 0.0–13.0)
NEUT#: 3.9 10*3/uL (ref 1.5–6.5)
NEUT%: 53.7 % (ref 39.6–80.0)
Platelets: 202 10*3/uL (ref 145–400)
RBC: 4.36 10*6/uL (ref 3.70–5.32)
RDW: 13 % (ref 11.1–15.7)
WBC: 7.3 10*3/uL (ref 3.9–10.0)

## 2014-08-05 LAB — IRON AND TIBC CHCC
%SAT: 19 % — AB (ref 21–57)
Iron: 57 ug/dL (ref 41–142)
TIBC: 305 ug/dL (ref 236–444)
UIBC: 248 ug/dL (ref 120–384)

## 2014-08-05 LAB — FERRITIN CHCC: FERRITIN: 53 ng/mL (ref 9–269)

## 2014-08-05 MED ORDER — BUPROPION HCL ER (XL) 150 MG PO TB24: 150.0000 mg | ORAL_TABLET | Freq: Every day | ORAL | Status: DC

## 2014-08-05 NOTE — Telephone Encounter (Addendum)
-----   Message from [name], MD sent at 08/05/2014  2:37 PM EST ----- Call and let him know the iron is a little on the low side. She probably needs one dose of Feraheme at 1020 mg IV. Please set this up at her convenience. Pete -Informed pt that iron is low and she needs IV iron. Set pt up with an appt for 08/07/14 at 12pm.

## 2014-08-05 NOTE — Progress Notes (Signed)
Hematology and Oncology Follow Up Visit  [name] [id number] [birth year] 68 y.o. 08/05/2014   Principle Diagnosis:  1. IgM kappa monoclonal gammopathy of undetermined significance. 2. Intermittent iron deficiency anemia.  Current Therapy:    IV iron as indicated     Interim History:  Ms.  [name] is back for follow-up. We see her yearly. Her husband sees be having a lot of issues right now. We prayed about this.  She has had no problems with bleeding. She's had no issues with nausea or vomiting. She's had no headache or she's had no rashes. She's had no swelling in the arms or legs. She's had no change in bowel or bladder habits.  She does feel tired.  Medications: Current outpatient prescriptions: ALPRAZolam (XANAX) 0.5 MG tablet, TAKE ONE-HALF TO ONE TABLET BY MOUTH THREE TIMES DAILY AS NEEDED, Disp: 90 tablet, Rfl: 3;  Azelastine HCl (ASTEPRO) 0.15 % SOLN, 2 sprays each nostril every 12 hours, Disp: 30 mL, Rfl: 11;  Cholecalciferol (VITAMIN D) 2000 UNITS CAPS, Take 4 capsules by mouth daily. , Disp: , Rfl:  Diphenhyd-Hydrocort-Nystatin (FIRST-DUKES MOUTHWASH) SUSP, Take 1 tsp gargle and swallow four times daily as needed., Disp: 120 mL, Rfl: 5;  fish oil-omega-3 fatty acids 1000 MG capsule, Take 2 g by mouth daily. , Disp: , Rfl: ;  fosinopril (MONOPRIL) 10 MG tablet, Take 1 tablet (10 mg total) by mouth daily., Disp: 30 tablet, Rfl: 3;  hydrocortisone 2.5 % cream, Apply 1 application topically as needed. RECTAL ITCHING, Disp: , Rfl:  Levothyroxine Sodium 75 MCG CAPS, 1/2 tab po qd, Disp: 45 capsule, Rfl: 1;  meclizine (ANTIVERT) 25 MG tablet, Take 1 tablet (25 mg total) by mouth every 6 (six) hours as needed for dizziness., Disp: 30 tablet, Rfl: 3;  meloxicam (MOBIC) 7.5 MG tablet, TAKE TWO TABLETS BY MOUTH EVERY DAY, Disp: 180 tablet, Rfl: 1;  metoprolol tartrate (LOPRESSOR) 25 MG tablet, Take 0.5 tablets (12.5 mg total) by mouth every morning., Disp: 30 tablet, Rfl: 3 Multiple  Vitamins-Iron (MULTIVITAMIN/IRON PO), Take 1 tablet by mouth daily. , Disp: , Rfl: ;  niacin (NIASPAN) 500 MG CR tablet, Take 1 tablet (500 mg total) by mouth at bedtime., Disp: 90 tablet, Rfl: 3;  promethazine (PHENERGAN) 25 MG tablet, Take 1 tablet (25 mg total) by mouth every 6 (six) hours as needed for nausea or vomiting., Disp: 30 tablet, Rfl: 1 simvastatin (ZOCOR) 40 MG tablet, Take 1 tablet (40 mg total) by mouth at bedtime., Disp: 30 tablet, Rfl: 5;  traMADol (ULTRAM) 50 MG tablet, Take 50 mg by mouth every 6 (six) hours as needed., Disp: , Rfl: ;  buPROPion (WELLBUTRIN XL) 150 MG 24 hr tablet, Take 1 tablet (150 mg total) by mouth daily., Disp: 30 tablet, Rfl: 3  Allergies:  Allergies  Allergen Reactions  . Cymbalta [Duloxetine Hcl]     Facial edema  . Iohexol      Code: HIVES, Desc: pt states her face and throat swells when administered IV contrast - KB, Onset Date: [id number]   . Mepergan [Meperidine-Promethazine]     Cardiac arrest  . Augmentin [Amoxicillin-Pot Clavulanate] Nausea Only  . Citalopram Other (See Comments)    Jittery/heart racing   . Hctz [Hydrochlorothiazide] Other (See Comments)    Too much urinating, felt dried out, etc--NO ALLERGY  . Lexapro [Escitalopram Oxalate] Other (See Comments)    Jittery,heart racing  . Lyrica [Pregabalin] Swelling    Face, hands, fingers, ankles  . Valsartan  REACTION: pt states INTOL to Diovan    Past Medical History, Surgical history, Social history, and Family History were reviewed and updated.  Review of Systems: As above  Physical Exam:  height is 5\' 6"  (1.676 m) and weight is 189 lb (85.73 kg). Her oral temperature is 97.7 F (36.5 C). Her blood pressure is 119/78 and her pulse is 77. Her respiration is 14.   Well-developed and well-nourished white female. She has no ocular or oral lesions. There are no palpable cervical or supraclavicular lymph nodes. Lungs are clear. Cardiac exam regular rate and rhythm with no  murmurs, rubs or bruits. Abdomen is soft. She has good bowel cells. There is no fluid wave. There is no palpable liver or spleen tip. Back exam shows no tenderness over the spine, ribs or hips. Extremities shows no clubbing, cyanosis or edema. She has good range motion of her joints. She has good strength. Skin exam shows no rashes, ecchymoses or petechia. Neurological exam is nonfocal.  Lab Results  Component Value Date   WBC 7.3 08/05/2014   HGB 12.7 08/05/2014   HCT 37.8 08/05/2014   MCV 87 08/05/2014   PLT 202 08/05/2014     Chemistry      Component Value Date/Time   NA 139 08/05/2014 0850   NA 145 01/15/2010 0835   K 3.8 08/05/2014 0850   K 4.2 01/15/2010 0835   CL 100 08/05/2014 0850   CL 103 01/15/2010 0835   CO2 24 08/05/2014 0850   CO2 30 01/15/2010 0835   BUN 13 08/05/2014 0850   BUN 16 01/15/2010 0835   CREATININE 1.03 08/05/2014 0850   CREATININE 0.9 01/15/2010 0835      Component Value Date/Time   CALCIUM 8.9 08/05/2014 0850   CALCIUM 9.9 01/15/2010 0835   ALKPHOS 96 08/05/2014 0850   ALKPHOS 109* 01/15/2010 0835   AST 18 08/05/2014 0850   AST 18 01/15/2010 0835   ALT 28 08/05/2014 0850   ALT 13 01/15/2010 0835   BILITOT 0.5 08/05/2014 0850   BILITOT 0.60 01/15/2010 0835      Ferritin is 53. Iron saturation is 19%. Total iron is 57.   Impression and Plan: [name] is 68 year old white female. She is becoming more iron deficient. Her hemoglobin is been holding pretty steady. She might be getting tired because of the decrease in iron.  We will see what her monoclonal study shows.  I will do the iron this week.  I probably will plan to get her back to be seen in about 3 or 4 months.   [name], MD 11/16/20156:41 PM

## 2014-08-07 ENCOUNTER — Ambulatory Visit: Payer: Medicare Other

## 2014-08-07 ENCOUNTER — Ambulatory Visit (HOSPITAL_BASED_OUTPATIENT_CLINIC_OR_DEPARTMENT_OTHER): Payer: Medicare Other

## 2014-08-07 VITALS — BP 127/60 | HR 64 | Temp 97.7°F | Resp 16

## 2014-08-07 DIAGNOSIS — D509 Iron deficiency anemia, unspecified: Secondary | ICD-10-CM | POA: Diagnosis not present

## 2014-08-07 LAB — COMPREHENSIVE METABOLIC PANEL
ALT: 28 U/L (ref 0–35)
AST: 18 U/L (ref 0–37)
Albumin: 4 g/dL (ref 3.5–5.2)
Alkaline Phosphatase: 96 U/L (ref 39–117)
BILIRUBIN TOTAL: 0.5 mg/dL (ref 0.2–1.2)
BUN: 13 mg/dL (ref 6–23)
CALCIUM: 8.9 mg/dL (ref 8.4–10.5)
CHLORIDE: 100 meq/L (ref 96–112)
CO2: 24 mEq/L (ref 19–32)
CREATININE: 1.03 mg/dL (ref 0.50–1.10)
Glucose, Bld: 106 mg/dL — ABNORMAL HIGH (ref 70–99)
Potassium: 3.8 mEq/L (ref 3.5–5.3)
Sodium: 139 mEq/L (ref 135–145)
Total Protein: 7.1 g/dL (ref 6.0–8.3)

## 2014-08-07 LAB — SPEP & IFE WITH QIG
ALPHA-1-GLOBULIN: 4.3 % (ref 2.9–4.9)
Albumin ELP: 55.1 % — ABNORMAL LOW (ref 55.8–66.1)
Alpha-2-Globulin: 12.7 % — ABNORMAL HIGH (ref 7.1–11.8)
BETA 2: 7.7 % — AB (ref 3.2–6.5)
Beta Globulin: 6.4 % (ref 4.7–7.2)
Gamma Globulin: 13.8 % (ref 11.1–18.8)
IGM, SERUM: 417 mg/dL — AB (ref 52–322)
IgA: 309 mg/dL (ref 69–380)
IgG (Immunoglobin G), Serum: 682 mg/dL — ABNORMAL LOW (ref 690–1700)
M-Spike, %: 0.21 g/dL
Total Protein, Serum Electrophoresis: 7.1 g/dL (ref 6.0–8.3)

## 2014-08-07 LAB — KAPPA/LAMBDA LIGHT CHAINS
KAPPA LAMBDA RATIO: 1.25 (ref 0.26–1.65)
Kappa free light chain: 2.27 mg/dL — ABNORMAL HIGH (ref 0.33–1.94)
Lambda Free Lght Chn: 1.82 mg/dL (ref 0.57–2.63)

## 2014-08-07 MED ORDER — SODIUM CHLORIDE 0.9 % IV SOLN
1020.0000 mg | Freq: Once | INTRAVENOUS | Status: AC
  Administered 2014-08-07: 1020 mg via INTRAVENOUS
  Filled 2014-08-07: qty 34

## 2014-08-07 MED ORDER — SODIUM CHLORIDE 0.9 % IV SOLN
INTRAVENOUS | Status: DC
  Administered 2014-08-07: 12:00:00 via INTRAVENOUS

## 2014-08-07 NOTE — Patient Instructions (Signed)

## 2014-08-08 ENCOUNTER — Encounter: Payer: Medicare Other | Admitting: Family Medicine

## 2014-08-14 ENCOUNTER — Ambulatory Visit (HOSPITAL_COMMUNITY)
Admission: RE | Admit: 2014-08-14 | Discharge: 2014-08-14 | Disposition: A | Discharge status: Home / Self Care | Payer: Medicare Other | Source: Ambulatory Visit | Attending: Gynecology | Admitting: Gynecology

## 2014-08-14 ENCOUNTER — Other Ambulatory Visit: Payer: Self-pay | Admitting: Gynecology

## 2014-08-14 DIAGNOSIS — Z1231 Encounter for screening mammogram for malignant neoplasm of breast: Secondary | ICD-10-CM

## 2014-08-30 DIAGNOSIS — M25561 Pain in right knee: Secondary | ICD-10-CM | POA: Diagnosis not present

## 2014-08-30 DIAGNOSIS — G894 Chronic pain syndrome: Secondary | ICD-10-CM | POA: Diagnosis not present

## 2014-08-30 DIAGNOSIS — M961 Postlaminectomy syndrome, not elsewhere classified: Secondary | ICD-10-CM | POA: Diagnosis not present

## 2014-08-30 DIAGNOSIS — K5900 Constipation, unspecified: Secondary | ICD-10-CM | POA: Diagnosis not present

## 2014-09-02 ENCOUNTER — Encounter: Payer: Self-pay | Admitting: Family Medicine

## 2014-09-02 ENCOUNTER — Ambulatory Visit (INDEPENDENT_AMBULATORY_CARE_PROVIDER_SITE_OTHER): Payer: Medicare Other | Admitting: Family Medicine

## 2014-09-02 VITALS — BP 127/84 | HR 54 | Temp 98.6°F | Resp 18 | Ht 66.0 in | Wt 186.0 lb

## 2014-09-02 DIAGNOSIS — I10 Essential (primary) hypertension: Secondary | ICD-10-CM

## 2014-09-02 DIAGNOSIS — M797 Fibromyalgia: Secondary | ICD-10-CM

## 2014-09-02 DIAGNOSIS — R221 Localized swelling, mass and lump, neck: Secondary | ICD-10-CM | POA: Diagnosis not present

## 2014-09-02 DIAGNOSIS — E7800 Pure hypercholesterolemia, unspecified: Secondary | ICD-10-CM

## 2014-09-02 DIAGNOSIS — E039 Hypothyroidism, unspecified: Secondary | ICD-10-CM | POA: Diagnosis not present

## 2014-09-02 MED ORDER — MELOXICAM 7.5 MG PO TABS: 15.0000 mg | ORAL_TABLET | Freq: Every day | ORAL | Status: DC

## 2014-09-02 MED ORDER — ALPRAZOLAM 0.5 MG PO TABS: ORAL_TABLET | ORAL | Status: DC

## 2014-09-02 NOTE — Progress Notes (Signed)
Pre visit review using our clinic review tool, if applicable. No additional management support is needed unless otherwise documented below in the visit note. 

## 2014-09-02 NOTE — Progress Notes (Signed)
Office Note 09/06/2014  CC:  F/u chronic med issues  HPI:  [name] is a 68 y.o. White female who is here for f/u HTN, anx/dep, hyperlipidemia, hypothyroidism, CRI stage II, OSA, fibromyalgia and chronic fatigue.   She happily reports that she has been weened off of oxycodone by her pain mgmt MD and is now on tramadol for pain and doing fine--pain reasonably well controlled (deg joint disease).  Not currently in a "fibro flare".    Anxiety level and mood pretty stable lately.  Going to the [facility] and getting out more.  Admits she drinks FOUR 16 oz Dr. Samson Frederic per day.  She had iron infusion via [name] 08/07/14.  He is following her q6 mo or so for monoclonal gammopathy of undetermined significance. She does not monitor her bp at home but is compliant with meds, tries to watch sodium in diet.  She tells me she thinks she feels a little nodule in the front part of her neck near the sternal notch, points to the area of the thyroid gland.    Past Medical History  Diagnosis Date  . OSA (obstructive sleep apnea)     Dr. Halford Chessman  . Allergic rhinitis   . HTN (hypertension)   . Chest pain     cr  . Palpitations   . Hypercholesterolemia   . GERD (gastroesophageal reflux disease)   . Diverticulosis of colon   . History of colonic polyps   . DJD (degenerative joint disease)   . Lumbar back pain   . Fibromyalgia   . Osteopenia 04/2014    T score -2.0 X. 11%/1.8%  . Vitamin D deficiency   . Anxiety   . Depression   . Psoriasis   . Unspecified disorder of plasma protein metabolism     IgM kappa MGUS ([name]) routine f/u has shown stability of this problem.  . IC (interstitial cystitis)     [name] at [facility]  . Iron deficiency anemia, unspecified 06/27/2013  . Memory impairment 10/03/2012    Memory impairment 10/03/2012 >referral to neuro    . Hypothyroid 07/05/2012    TSH ~5.92 >begin Synthroid 40mcg daily 07/05/2012  TSH 2.42 09/28/12 >decreased synthroid  58mcg daily    . FATIGUE / MALAISE 07/01/2009    Chronic  . Chronic renal insufficiency, stage II (mild) 2015    CrCl in th 60s.  . Chronic pain syndrome     right knee--[name]    Past Surgical History  Procedure Laterality Date  . Laminotomy  2010    L-4, L-5 FUSION  . Knee arthroscopy      RIGHT KNEE X 5  . Total knee arthroplasty  12-07 and 4-07    [name], revision 12-09 [name] at [facility]  . Patella fracture surgery  10-08    [name]  . Posterior laminectomy / decompression lumbar spine  9-09    [name]  . Cholecystectomy    . Hand surgery      TRIGGER FINGER  . Hernia repair    . Abdominal hysterectomy      for prolapse with abdominal incision  . Oophorectomy  2010    Laparoscopic BSO (path benign)  . Esophagogastroduodenoscopy  05/29/2009    Esoph normal.  Gastritis (H pylori NEG) + gastric polyps (benign).  Duodenal biopsy NORMAL.  [name] Colonoscopy  04/2006    Diverticulosis, o/w normal.  . Nasal sinus surgery      polypectomy (remote past)  Family History  Problem Relation Age of Onset  . Hypertension Mother   . Hypertension Father   . Breast cancer Sister     Age 62's  . Hypertension Sister   . Leukemia Brother   . Lung disease Brother   . Cancer Brother     Melanoma  . Coronary artery disease Sister   . Lupus Sister     History   Social History  . Marital Status: Married    Spouse Name: [name]    Number of Children: 0  . Years of Education: N/A   Occupational History  .     Social History Main Topics  . Smoking status: Never Smoker   . Smokeless tobacco: Never Used  . Alcohol Use: No  . Drug Use: No  . Sexual Activity: Not Currently    Birth Control/ Protection: Surgical   Other Topics Concern  . Not on file   Social History Narrative   Married, no children.   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   No Tob.  No alc.  No drugs.   Orig from [place].    Outpatient Prescriptions Prior to Visit   Medication Sig Dispense Refill  . Azelastine HCl (ASTEPRO) 0.15 % SOLN 2 sprays each nostril every 12 hours 30 mL 11  . Cholecalciferol (VITAMIN D) 2000 UNITS CAPS Take 4 capsules by mouth daily.     . fosinopril (MONOPRIL) 10 MG tablet Take 1 tablet (10 mg total) by mouth daily. 30 tablet 3  . hydrocortisone 2.5 % cream Apply 1 application topically as needed. RECTAL ITCHING    . Levothyroxine Sodium 75 MCG CAPS 1/2 tab po qd 45 capsule 1  . meclizine (ANTIVERT) 25 MG tablet Take 1 tablet (25 mg total) by mouth every 6 (six) hours as needed for dizziness. 30 tablet 3  . metoprolol tartrate (LOPRESSOR) 25 MG tablet Take 0.5 tablets (12.5 mg total) by mouth every morning. 30 tablet 3  . Multiple Vitamins-Iron (MULTIVITAMIN/IRON PO) Take 1 tablet by mouth daily.     . niacin (NIASPAN) 500 MG CR tablet Take 1 tablet (500 mg total) by mouth at bedtime. 90 tablet 3  . promethazine (PHENERGAN) 25 MG tablet Take 1 tablet (25 mg total) by mouth every 6 (six) hours as needed for nausea or vomiting. 30 tablet 1  . simvastatin (ZOCOR) 40 MG tablet Take 1 tablet (40 mg total) by mouth at bedtime. 30 tablet 5  . traMADol (ULTRAM) 50 MG tablet Take 50 mg by mouth every 6 (six) hours as needed.    . ALPRAZolam (XANAX) 0.5 MG tablet TAKE ONE-HALF TO ONE TABLET BY MOUTH THREE TIMES DAILY AS NEEDED 90 tablet 3  . meloxicam (MOBIC) 7.5 MG tablet TAKE TWO TABLETS BY MOUTH EVERY DAY 180 tablet 1  . fish oil-omega-3 fatty acids 1000 MG capsule Take 2 g by mouth daily.     [name] buPROPion (WELLBUTRIN XL) 150 MG 24 hr tablet Take 1 tablet (150 mg total) by mouth daily. (Patient not taking: Reported on 09/02/2014) 30 tablet 3  . Diphenhyd-Hydrocort-Nystatin (FIRST-DUKES MOUTHWASH) SUSP Take 1 tsp gargle and swallow four times daily as needed. (Patient not taking: Reported on 09/02/2014) 120 mL 5   No facility-administered medications prior to visit.    Allergies  Allergen Reactions  . Cymbalta [Duloxetine Hcl]      Facial edema  . Iohexol      Code: HIVES, Desc: pt states her face and throat swells when administered IV contrast -  KB, Onset Date: [id number]   . Mepergan [Meperidine-Promethazine]     Cardiac arrest  . Augmentin [Amoxicillin-Pot Clavulanate] Nausea Only  . Citalopram Other (See Comments)    Jittery/heart racing   . Hctz [Hydrochlorothiazide] Other (See Comments)    Too much urinating, felt dried out, etc--NO ALLERGY  . Lexapro [Escitalopram Oxalate] Other (See Comments)    Jittery,heart racing  . Lyrica [Pregabalin] Swelling    Face, hands, fingers, ankles  . Valsartan     REACTION: pt states INTOL to Diovan    ROS Review of Systems  Constitutional: Negative for fever and fatigue.  HENT: Negative for congestion and sore throat.   Eyes: Negative for visual disturbance.  Respiratory: Negative for cough.   Cardiovascular: Negative for chest pain.  Gastrointestinal: Negative for nausea and abdominal pain.  Genitourinary: Negative for dysuria.  Musculoskeletal: Negative for back pain and joint swelling.  Skin: Negative for rash.  Neurological: Negative for weakness and headaches.  Hematological: Negative for adenopathy.    PE; Blood pressure 127/84, pulse 54, temperature 98.6 F (37 C), temperature source Temporal, resp. rate 18, height 5\' 6"  (1.676 m), weight 186 lb (84.369 kg), SpO2 97 %. Gen: Alert, well appearing.  Patient is oriented to person, place, time, and situation. NECK: no bruit, carotid 1+ on left, 2+ on right No palpable thyroid gland or thyroid nodule or mass in suprasternal notch region where pt says she feels a lump sometimes. CV: RRR, no m/r/g.   LUNGS: CTA bilat, nonlabored resps, good aeration in all lung fields. EXT: no clubbing, cyanosis, or edema.   Pertinent labs:  None today RECENT: Lab Results  Component Value Date   WBC 7.3 08/05/2014   HGB 12.7 08/05/2014   HCT 37.8 08/05/2014   MCV 87 08/05/2014   PLT 202 08/05/2014     Chemistry       Component Value Date/Time   NA 139 08/05/2014 0850   NA 145 01/15/2010 0835   K 3.8 08/05/2014 0850   K 4.2 01/15/2010 0835   CL 100 08/05/2014 0850   CL 103 01/15/2010 0835   CO2 24 08/05/2014 0850   CO2 30 01/15/2010 0835   BUN 13 08/05/2014 0850   BUN 16 01/15/2010 0835   CREATININE 1.03 08/05/2014 0850   CREATININE 0.9 01/15/2010 0835      Component Value Date/Time   CALCIUM 8.9 08/05/2014 0850   CALCIUM 9.9 01/15/2010 0835   ALKPHOS 96 08/05/2014 0850   ALKPHOS 109* 01/15/2010 0835   AST 18 08/05/2014 0850   AST 18 01/15/2010 0835   ALT 28 08/05/2014 0850   ALT 13 01/15/2010 0835   BILITOT 0.5 08/05/2014 0850   BILITOT 0.60 01/15/2010 0835     Lab Results  Component Value Date   TSH 4.15 03/28/2014   Lab Results  Component Value Date   CHOL 240* 08/13/2013   HDL 44.50 08/13/2013   LDLCALC 47 02/01/2012   LDLDIRECT 187.8 08/13/2013   TRIG 84.0 08/13/2013   CHOLHDL 5 08/13/2013     ASSESSMENT AND PLAN:   1) HTN; The current medical regimen is effective;  continue present plan and medications. Recent lytes/cr fine.  2) Hypothyroidism: recheck TSH at lab visit 09/23/13.  3) Hyperlipidemia: recheck FLP at lab visit 09/23/13.  4) Chronic DJD pain: improved, feeling better off of oxycodone and on tramadol. Continue with pain mgmt clinic.  5) Fibromyalgia: currently "quiescent".  6) Question of neck nodule (palpable by pt): will order soft tissue neck ultrasound.  An After Visit Summary was printed and given to the patient.  Patient declined prevnar today.  FOLLOW UP:  Return in about 6 months (around 03/04/2015) for routine chronic illness f/u.

## 2014-09-05 ENCOUNTER — Ambulatory Visit (HOSPITAL_BASED_OUTPATIENT_CLINIC_OR_DEPARTMENT_OTHER)
Admission: RE | Admit: 2014-09-05 | Discharge: 2014-09-05 | Disposition: A | Discharge status: Home / Self Care | Payer: Medicare Other | Source: Ambulatory Visit | Attending: Family Medicine | Admitting: Family Medicine

## 2014-09-05 DIAGNOSIS — E039 Hypothyroidism, unspecified: Secondary | ICD-10-CM

## 2014-09-05 DIAGNOSIS — R221 Localized swelling, mass and lump, neck: Secondary | ICD-10-CM | POA: Diagnosis not present

## 2014-09-05 DIAGNOSIS — E041 Nontoxic single thyroid nodule: Secondary | ICD-10-CM | POA: Diagnosis not present

## 2014-09-09 DIAGNOSIS — M7062 Trochanteric bursitis, left hip: Secondary | ICD-10-CM | POA: Diagnosis not present

## 2014-09-23 ENCOUNTER — Other Ambulatory Visit (INDEPENDENT_AMBULATORY_CARE_PROVIDER_SITE_OTHER): Payer: Medicare Other

## 2014-09-23 DIAGNOSIS — E785 Hyperlipidemia, unspecified: Secondary | ICD-10-CM

## 2014-09-23 DIAGNOSIS — E039 Hypothyroidism, unspecified: Secondary | ICD-10-CM | POA: Diagnosis not present

## 2014-09-23 DIAGNOSIS — E559 Vitamin D deficiency, unspecified: Secondary | ICD-10-CM | POA: Diagnosis not present

## 2014-09-23 LAB — LIPID PANEL
CHOL/HDL RATIO: 6
CHOLESTEROL: 209 mg/dL — AB (ref 0–200)
HDL: 36.6 mg/dL — AB (ref >39.00)
LDL CALC: 138 mg/dL — AB (ref 0–99)
NonHDL: 172.4
TRIGLYCERIDES: 172 mg/dL — AB (ref 0.0–149.0)
VLDL: 34.4 mg/dL (ref 0.0–40.0)

## 2014-09-23 LAB — VITAMIN D 25 HYDROXY (VIT D DEFICIENCY, FRACTURES): VITD: 43.42 ng/mL (ref 30.00–100.00)

## 2014-09-23 LAB — TSH: TSH: 4.38 u[IU]/mL (ref 0.35–4.50)

## 2014-09-23 MED ORDER — METOPROLOL TARTRATE 25 MG PO TABS
12.5000 mg | ORAL_TABLET | Freq: Every morning | ORAL | Status: DC
Start: 2014-09-23 — End: 2015-07-11

## 2014-09-23 MED ORDER — FOSINOPRIL SODIUM 10 MG PO TABS: 10.0000 mg | ORAL_TABLET | Freq: Every day | ORAL | Status: DC

## 2014-09-23 MED ORDER — SIMVASTATIN 40 MG PO TABS: 40.0000 mg | ORAL_TABLET | Freq: Every day | ORAL | Status: DC

## 2014-09-26 DIAGNOSIS — M5136 Other intervertebral disc degeneration, lumbar region: Secondary | ICD-10-CM | POA: Diagnosis not present

## 2014-09-27 ENCOUNTER — Other Ambulatory Visit: Payer: Self-pay | Admitting: Family Medicine

## 2014-09-27 DIAGNOSIS — G894 Chronic pain syndrome: Secondary | ICD-10-CM | POA: Diagnosis not present

## 2014-09-27 DIAGNOSIS — M961 Postlaminectomy syndrome, not elsewhere classified: Secondary | ICD-10-CM | POA: Diagnosis not present

## 2014-09-27 DIAGNOSIS — M25561 Pain in right knee: Secondary | ICD-10-CM | POA: Diagnosis not present

## 2014-09-27 DIAGNOSIS — K5900 Constipation, unspecified: Secondary | ICD-10-CM | POA: Diagnosis not present

## 2014-09-27 MED ORDER — LEVOTHYROXINE SODIUM 75 MCG PO CAPS: ORAL_CAPSULE | ORAL | Status: DC

## 2014-10-03 DIAGNOSIS — M5136 Other intervertebral disc degeneration, lumbar region: Secondary | ICD-10-CM | POA: Diagnosis not present

## 2014-10-03 DIAGNOSIS — M7062 Trochanteric bursitis, left hip: Secondary | ICD-10-CM | POA: Diagnosis not present

## 2014-10-08 DIAGNOSIS — M545 Low back pain: Secondary | ICD-10-CM | POA: Diagnosis not present

## 2014-10-15 DIAGNOSIS — M5127 Other intervertebral disc displacement, lumbosacral region: Secondary | ICD-10-CM | POA: Diagnosis not present

## 2014-10-15 DIAGNOSIS — M5126 Other intervertebral disc displacement, lumbar region: Secondary | ICD-10-CM | POA: Insufficient documentation

## 2014-10-21 DIAGNOSIS — M25561 Pain in right knee: Secondary | ICD-10-CM | POA: Diagnosis not present

## 2014-10-21 DIAGNOSIS — K5900 Constipation, unspecified: Secondary | ICD-10-CM | POA: Diagnosis not present

## 2014-10-21 DIAGNOSIS — G894 Chronic pain syndrome: Secondary | ICD-10-CM | POA: Diagnosis not present

## 2014-10-21 DIAGNOSIS — M961 Postlaminectomy syndrome, not elsewhere classified: Secondary | ICD-10-CM | POA: Diagnosis not present

## 2014-10-22 DIAGNOSIS — M545 Low back pain: Secondary | ICD-10-CM | POA: Diagnosis not present

## 2014-10-22 DIAGNOSIS — R262 Difficulty in walking, not elsewhere classified: Secondary | ICD-10-CM | POA: Diagnosis not present

## 2014-10-22 DIAGNOSIS — M5127 Other intervertebral disc displacement, lumbosacral region: Secondary | ICD-10-CM | POA: Diagnosis not present

## 2014-10-24 ENCOUNTER — Telehealth: Payer: Self-pay | Admitting: Family Medicine

## 2014-10-24 ENCOUNTER — Encounter: Payer: Medicare Other | Admitting: Gynecology

## 2014-10-24 ENCOUNTER — Other Ambulatory Visit: Payer: Self-pay | Admitting: Family Medicine

## 2014-10-24 DIAGNOSIS — M4806 Spinal stenosis, lumbar region: Secondary | ICD-10-CM | POA: Diagnosis not present

## 2014-10-24 DIAGNOSIS — M461 Sacroiliitis, not elsewhere classified: Secondary | ICD-10-CM | POA: Diagnosis not present

## 2014-10-24 DIAGNOSIS — M5416 Radiculopathy, lumbar region: Secondary | ICD-10-CM | POA: Diagnosis not present

## 2014-10-24 DIAGNOSIS — M5127 Other intervertebral disc displacement, lumbosacral region: Secondary | ICD-10-CM | POA: Diagnosis not present

## 2014-10-24 MED ORDER — PHENAZOPYRIDINE HCL 200 MG PO TABS: 200.0000 mg | ORAL_TABLET | Freq: Three times a day (TID) | ORAL | Status: DC | PRN

## 2014-10-24 NOTE — Telephone Encounter (Signed)
I will send in pyridium but not antibiotics b/c she has a history of interstitial cystitis, which can cause the same sx's as a bladder infection but the urine culture grows no bacteria b/c the inflammation is not due to infection. I recommend she come in and leave a specimen of urine to send for culture (no UA needed b/c her pyridium use will mess the UA up and make it invalid).  If the culture returns showing bacterial growth then I'll rx antibiotics. I reviewed her urine cultures and the last several have not grown any bacteria. She can come in for o/v if she would rather, but just coming in to give urine sample is okay as well.-thx

## 2014-10-24 NOTE — Telephone Encounter (Signed)
Patient requesting cipro and pyridium for UTI / cystitis symptoms.  I looked in pt's past labs and it does look like she has a few urine cultures in there.  Please advise.

## 2014-10-25 DIAGNOSIS — R262 Difficulty in walking, not elsewhere classified: Secondary | ICD-10-CM | POA: Diagnosis not present

## 2014-10-25 DIAGNOSIS — M545 Low back pain: Secondary | ICD-10-CM | POA: Diagnosis not present

## 2014-10-25 DIAGNOSIS — M5127 Other intervertebral disc displacement, lumbosacral region: Secondary | ICD-10-CM | POA: Diagnosis not present

## 2014-10-28 NOTE — Telephone Encounter (Signed)
Pt aware.

## 2014-10-29 DIAGNOSIS — M545 Low back pain: Secondary | ICD-10-CM | POA: Diagnosis not present

## 2014-10-29 DIAGNOSIS — R262 Difficulty in walking, not elsewhere classified: Secondary | ICD-10-CM | POA: Diagnosis not present

## 2014-10-29 DIAGNOSIS — M5127 Other intervertebral disc displacement, lumbosacral region: Secondary | ICD-10-CM | POA: Diagnosis not present

## 2014-11-01 DIAGNOSIS — R262 Difficulty in walking, not elsewhere classified: Secondary | ICD-10-CM | POA: Diagnosis not present

## 2014-11-01 DIAGNOSIS — M5127 Other intervertebral disc displacement, lumbosacral region: Secondary | ICD-10-CM | POA: Diagnosis not present

## 2014-11-01 DIAGNOSIS — M545 Low back pain: Secondary | ICD-10-CM | POA: Diagnosis not present

## 2014-11-05 DIAGNOSIS — M5416 Radiculopathy, lumbar region: Secondary | ICD-10-CM | POA: Diagnosis not present

## 2014-11-05 DIAGNOSIS — M4806 Spinal stenosis, lumbar region: Secondary | ICD-10-CM | POA: Diagnosis not present

## 2014-11-12 DIAGNOSIS — M545 Low back pain: Secondary | ICD-10-CM | POA: Diagnosis not present

## 2014-11-12 DIAGNOSIS — R262 Difficulty in walking, not elsewhere classified: Secondary | ICD-10-CM | POA: Diagnosis not present

## 2014-11-12 DIAGNOSIS — M5127 Other intervertebral disc displacement, lumbosacral region: Secondary | ICD-10-CM | POA: Diagnosis not present

## 2014-11-14 DIAGNOSIS — R262 Difficulty in walking, not elsewhere classified: Secondary | ICD-10-CM | POA: Diagnosis not present

## 2014-11-14 DIAGNOSIS — M5127 Other intervertebral disc displacement, lumbosacral region: Secondary | ICD-10-CM | POA: Diagnosis not present

## 2014-11-14 DIAGNOSIS — M545 Low back pain: Secondary | ICD-10-CM | POA: Diagnosis not present

## 2014-11-19 DIAGNOSIS — M961 Postlaminectomy syndrome, not elsewhere classified: Secondary | ICD-10-CM | POA: Diagnosis not present

## 2014-11-19 DIAGNOSIS — K5900 Constipation, unspecified: Secondary | ICD-10-CM | POA: Diagnosis not present

## 2014-11-19 DIAGNOSIS — M25561 Pain in right knee: Secondary | ICD-10-CM | POA: Diagnosis not present

## 2014-11-19 DIAGNOSIS — G894 Chronic pain syndrome: Secondary | ICD-10-CM | POA: Diagnosis not present

## 2014-11-27 DIAGNOSIS — M461 Sacroiliitis, not elsewhere classified: Secondary | ICD-10-CM | POA: Diagnosis not present

## 2014-12-02 ENCOUNTER — Telehealth: Payer: Self-pay | Admitting: Hematology & Oncology

## 2014-12-02 ENCOUNTER — Ambulatory Visit: Payer: Medicare Other | Admitting: Hematology & Oncology

## 2014-12-02 ENCOUNTER — Other Ambulatory Visit: Payer: Medicare Other | Admitting: Lab

## 2014-12-02 NOTE — Telephone Encounter (Signed)
Pt moved 3-14 to 3-29 she is sick

## 2014-12-03 ENCOUNTER — Encounter: Payer: Self-pay | Admitting: Family Medicine

## 2014-12-03 ENCOUNTER — Ambulatory Visit (INDEPENDENT_AMBULATORY_CARE_PROVIDER_SITE_OTHER): Payer: Medicare Other | Admitting: Family Medicine

## 2014-12-03 VITALS — BP 115/82 | HR 69 | Temp 97.8°F | Resp 18 | Ht 66.0 in | Wt 187.0 lb

## 2014-12-03 DIAGNOSIS — J069 Acute upper respiratory infection, unspecified: Secondary | ICD-10-CM

## 2014-12-03 DIAGNOSIS — B349 Viral infection, unspecified: Secondary | ICD-10-CM | POA: Diagnosis not present

## 2014-12-03 DIAGNOSIS — J208 Acute bronchitis due to other specified organisms: Secondary | ICD-10-CM

## 2014-12-03 NOTE — Progress Notes (Signed)
OFFICE NOTE  12/03/2014  CC:  Chief Complaint  Patient presents with  . Facial Pain    Monday  . Tinnitus  . Cough   HPI: Patient is a 69 y.o. Caucasian female who is here for: describes 4-5 days in a row last week in which she awoke in morning with red and burning cheeks.  No malar distribution.  Present throughout her day but not as bad as mornings.  This went away for good a few days ago, then she started to get a cough (2-3d/a), some left ear discomfort/fullness/?ringing, and says she heard wheezing coming from her chest and says the cough sounds like her sister when she has bronchitis.  No fever. No SOB.  Throat is burning some, hurts when she coughs, voice is a little gravely.  The cough is nonproductive except for once she had a small amount of yellow mucous.  Slight clear runny nose. No new joint complaints.  Pertinent PMH:  Past medical, surgical, social, and family history reviewed and no changes are noted since last office visit.  MEDS:  Outpatient Prescriptions Prior to Visit  Medication Sig Dispense Refill  . ALPRAZolam (XANAX) 0.5 MG tablet TAKE ONE-HALF TO ONE TABLET BY MOUTH THREE TIMES DAILY AS NEEDED 90 tablet 5  . Azelastine HCl (ASTEPRO) 0.15 % SOLN 2 sprays each nostril every 12 hours 30 mL 11  . Cholecalciferol (VITAMIN D) 2000 UNITS CAPS Take 4 capsules by mouth daily.     . fish oil-omega-3 fatty acids 1000 MG capsule Take 2 g by mouth daily.     . fosinopril (MONOPRIL) 10 MG tablet Take 1 tablet (10 mg total) by mouth daily. 30 tablet 3  . hydrocortisone 2.5 % cream Apply 1 application topically as needed. RECTAL ITCHING    . Levothyroxine Sodium 75 MCG CAPS 1/2 tab po qd 45 capsule 1  . meclizine (ANTIVERT) 25 MG tablet Take 1 tablet (25 mg total) by mouth every 6 (six) hours as needed for dizziness. 30 tablet 3  . metoprolol tartrate (LOPRESSOR) 25 MG tablet Take 0.5 tablets (12.5 mg total) by mouth every morning. 30 tablet 3  . phenazopyridine (PYRIDIUM) 200  MG tablet Take 1 tablet (200 mg total) by mouth 3 (three) times daily as needed for pain. 15 tablet 1  . promethazine (PHENERGAN) 25 MG tablet Take 1 tablet (25 mg total) by mouth every 6 (six) hours as needed for nausea or vomiting. 30 tablet 1  . simvastatin (ZOCOR) 40 MG tablet Take 1 tablet (40 mg total) by mouth at bedtime. 30 tablet 3  . meloxicam (MOBIC) 7.5 MG tablet Take 2 tablets (15 mg total) by mouth daily. (Patient not taking: Reported on 12/03/2014) 180 tablet 1  . Multiple Vitamins-Iron (MULTIVITAMIN/IRON PO) Take 1 tablet by mouth daily.     . niacin (NIASPAN) 500 MG CR tablet Take 1 tablet (500 mg total) by mouth at bedtime. 90 tablet 3  . traMADol (ULTRAM) 50 MG tablet Take 50 mg by mouth every 6 (six) hours as needed.     No facility-administered medications prior to visit.    PE: Blood pressure 115/82, pulse 69, temperature 97.8 F (36.6 C), temperature source Temporal, resp. rate 18, height 5\' 6"  (1.676 m), weight 187 lb (84.823 kg), SpO2 95 %. VS: noted--normal. Gen: alert, NAD, NONTOXIC APPEARING. HEENT: eyes without injection, drainage, or swelling.  Ears: EACs clear, TMs with normal light reflex and landmarks.  Nose: Clear rhinorrhea, with some dried, crusty exudate adherent to  mildly injected mucosa.  No purulent d/c.  No paranasal sinus TTP.  No facial swelling.  Throat and mouth without focal lesion.  No pharyngial swelling, erythema, or exudate.   Neck: supple, no LAD.   LUNGS: CTA bilat, nonlabored resps.   CV: RRR, no m/r/g. EXT: no c/c/e SKIN: no rash    IMPRESSION AND PLAN:  Viral respiratory syndrome, possibly parvo B19. Reassured pt, recommended she continue OTC med symptomatic care. If rash on cheeks returns I asked her to take a picture of it or return for exam. Signs/symptoms to call or return for were reviewed and pt expressed understanding.   An After Visit Summary was printed and given to the patient.  FOLLOW UP: prn

## 2014-12-03 NOTE — Progress Notes (Signed)
Pre visit review using our clinic review tool, if applicable. No additional management support is needed unless otherwise documented below in the visit note. 

## 2014-12-17 ENCOUNTER — Ambulatory Visit (HOSPITAL_BASED_OUTPATIENT_CLINIC_OR_DEPARTMENT_OTHER): Payer: Medicare Other | Admitting: Hematology & Oncology

## 2014-12-17 ENCOUNTER — Encounter: Payer: Self-pay | Admitting: Hematology & Oncology

## 2014-12-17 ENCOUNTER — Ambulatory Visit (HOSPITAL_BASED_OUTPATIENT_CLINIC_OR_DEPARTMENT_OTHER): Payer: Medicare Other

## 2014-12-17 VITALS — BP 126/63 | HR 60 | Temp 97.4°F | Resp 14 | Ht 66.0 in | Wt 187.0 lb

## 2014-12-17 DIAGNOSIS — D509 Iron deficiency anemia, unspecified: Secondary | ICD-10-CM | POA: Diagnosis not present

## 2014-12-17 DIAGNOSIS — G894 Chronic pain syndrome: Secondary | ICD-10-CM | POA: Diagnosis not present

## 2014-12-17 DIAGNOSIS — E8809 Other disorders of plasma-protein metabolism, not elsewhere classified: Secondary | ICD-10-CM | POA: Diagnosis not present

## 2014-12-17 DIAGNOSIS — K5900 Constipation, unspecified: Secondary | ICD-10-CM | POA: Diagnosis not present

## 2014-12-17 DIAGNOSIS — M62830 Muscle spasm of back: Secondary | ICD-10-CM | POA: Diagnosis not present

## 2014-12-17 DIAGNOSIS — W108XXA Fall (on) (from) other stairs and steps, initial encounter: Secondary | ICD-10-CM | POA: Diagnosis not present

## 2014-12-17 DIAGNOSIS — M961 Postlaminectomy syndrome, not elsewhere classified: Secondary | ICD-10-CM | POA: Diagnosis not present

## 2014-12-17 DIAGNOSIS — D472 Monoclonal gammopathy: Secondary | ICD-10-CM | POA: Diagnosis not present

## 2014-12-17 LAB — CBC WITH DIFFERENTIAL (CANCER CENTER ONLY)
BASO#: 0 10*3/uL (ref 0.0–0.2)
BASO%: 0.3 % (ref 0.0–2.0)
EOS ABS: 0.2 10*3/uL (ref 0.0–0.5)
EOS%: 2 % (ref 0.0–7.0)
HCT: 40.9 % (ref 34.8–46.6)
HGB: 13.7 g/dL (ref 11.6–15.9)
LYMPH#: 2.4 10*3/uL (ref 0.9–3.3)
LYMPH%: 27.3 % (ref 14.0–48.0)
MCH: 30.2 pg (ref 26.0–34.0)
MCHC: 33.5 g/dL (ref 32.0–36.0)
MCV: 90 fL (ref 81–101)
MONO#: 0.5 10*3/uL (ref 0.1–0.9)
MONO%: 5.8 % (ref 0.0–13.0)
NEUT%: 64.6 % (ref 39.6–80.0)
NEUTROS ABS: 5.7 10*3/uL (ref 1.5–6.5)
Platelets: 217 10*3/uL (ref 145–400)
RBC: 4.54 10*6/uL (ref 3.70–5.32)
RDW: 12.5 % (ref 11.1–15.7)
WBC: 8.9 10*3/uL (ref 3.9–10.0)

## 2014-12-17 LAB — COMPREHENSIVE METABOLIC PANEL
ALBUMIN: 4 g/dL (ref 3.5–5.2)
ALK PHOS: 87 U/L (ref 39–117)
ALT: 11 U/L (ref 0–35)
AST: 10 U/L (ref 0–37)
BUN: 19 mg/dL (ref 6–23)
CO2: 24 mEq/L (ref 19–32)
Calcium: 9.4 mg/dL (ref 8.4–10.5)
Chloride: 103 mEq/L (ref 96–112)
Creatinine, Ser: 0.84 mg/dL (ref 0.50–1.10)
GLUCOSE: 93 mg/dL (ref 70–99)
POTASSIUM: 3.9 meq/L (ref 3.5–5.3)
SODIUM: 141 meq/L (ref 135–145)
Total Bilirubin: 0.7 mg/dL (ref 0.2–1.2)
Total Protein: 7.1 g/dL (ref 6.0–8.3)

## 2014-12-17 LAB — FERRITIN CHCC: Ferritin: 332 ng/ml — ABNORMAL HIGH (ref 9–269)

## 2014-12-17 LAB — IRON AND TIBC CHCC
%SAT: 27 % (ref 21–57)
IRON: 71 ug/dL (ref 41–142)
TIBC: 264 ug/dL (ref 236–444)
UIBC: 193 ug/dL (ref 120–384)

## 2014-12-17 NOTE — Progress Notes (Signed)
Hematology and Oncology Follow Up Visit  [name] [id number] [birth year] 69 y.o. 12/17/2014   Principle Diagnosis:  1. IgM kappa monoclonal gammopathy of undetermined significance. 2. Intermittent iron deficiency anemia.  Current Therapy:    IV iron as indicated     Interim History:  Ms.  [name] is back for follow-up. We last saw her back in November. No point,, her lab studies showed a ferritin of 53 and iron saturation 19%.  Her M spike, which we have been following was 0.21 and this is holding steady. Her IgM level was 417 mg/dL, which again, has been pretty steady. She has problems with her lower back. She has a herniated disc at L3-4 area and she's getting epidural injections which have not helped.[name] Has had back surgery.  Her husband now has Graves' disease. He is being seen out at [facility]. He's had ocular surgery because of muscle entrapment by the Graves' disease.  As always, she does feel tired. She's had no bleeding. She's had no leg swelling. She's had no rashes.  Overall, her performance status is ECOG 1.  Medications:  Current outpatient prescriptions:  .  ALPRAZolam (XANAX) 0.5 MG tablet, TAKE ONE-HALF TO ONE TABLET BY MOUTH THREE TIMES DAILY AS NEEDED, Disp: 90 tablet, Rfl: 5 .  Azelastine HCl (ASTEPRO) 0.15 % SOLN, 2 sprays each nostril every 12 hours, Disp: 30 mL, Rfl: 11 .  Cholecalciferol (VITAMIN D) 2000 UNITS CAPS, Take 4 capsules by mouth daily. , Disp: , Rfl:  .  diphenhydrAMINE (BENADRYL) 25 MG tablet, Take 25 mg by mouth at bedtime as needed., Disp: , Rfl:  .  fish oil-omega-3 fatty acids 1000 MG capsule, Take 2 g by mouth daily. , Disp: , Rfl:  .  fosinopril (MONOPRIL) 10 MG tablet, Take 1 tablet (10 mg total) by mouth daily., Disp: 30 tablet, Rfl: 3 .  hydrocortisone 2.5 % cream, Apply 1 application topically as needed. RECTAL ITCHING, Disp: , Rfl:  .  Levothyroxine Sodium 75 MCG CAPS, 1/2 tab po qd, Disp: 45 capsule, Rfl: 1 .  meclizine (ANTIVERT) 25  MG tablet, Take 1 tablet (25 mg total) by mouth every 6 (six) hours as needed for dizziness., Disp: 30 tablet, Rfl: 3 .  meloxicam (MOBIC) 7.5 MG tablet, Take 2 tablets (15 mg total) by mouth daily., Disp: 180 tablet, Rfl: 1 .  metoprolol tartrate (LOPRESSOR) 25 MG tablet, Take 0.5 tablets (12.5 mg total) by mouth every morning., Disp: 30 tablet, Rfl: 3 .  Multiple Vitamins-Iron (MULTIVITAMIN/IRON PO), Take 1 tablet by mouth daily. , Disp: , Rfl:  .  niacin (NIASPAN) 500 MG CR tablet, Take 1 tablet (500 mg total) by mouth at bedtime., Disp: 90 tablet, Rfl: 3 .  oxyCODONE (OXY IR/ROXICODONE) 5 MG immediate release tablet, Take 5 mg by mouth. One tab every four hours, Disp: , Rfl:  .  phenazopyridine (PYRIDIUM) 200 MG tablet, Take 1 tablet (200 mg total) by mouth 3 (three) times daily as needed for pain., Disp: 15 tablet, Rfl: 1 .  promethazine (PHENERGAN) 25 MG tablet, Take 1 tablet (25 mg total) by mouth every 6 (six) hours as needed for nausea or vomiting., Disp: 30 tablet, Rfl: 1 .  simvastatin (ZOCOR) 40 MG tablet, Take 1 tablet (40 mg total) by mouth at bedtime., Disp: 30 tablet, Rfl: 3 .  traMADol (ULTRAM) 50 MG tablet, Take 50 mg by mouth every 6 (six) hours as needed., Disp: , Rfl:   Allergies:  Allergies  Allergen Reactions  .  Cymbalta [Duloxetine Hcl]     Facial edema  . Iohexol      Code: HIVES, Desc: pt states her face and throat swells when administered IV contrast - KB, Onset Date: [id number]   . Mepergan [Meperidine-Promethazine]     Cardiac arrest  . Augmentin [Amoxicillin-Pot Clavulanate] Nausea Only  . Citalopram Other (See Comments)    Jittery/heart racing   . Hctz [Hydrochlorothiazide] Other (See Comments)    Too much urinating, felt dried out, etc--NO ALLERGY  . Lexapro [Escitalopram Oxalate] Other (See Comments)    Jittery,heart racing  . Lyrica [Pregabalin] Swelling    Face, hands, fingers, ankles  . Valsartan     REACTION: pt states INTOL to Diovan    Past  Medical History, Surgical history, Social history, and Family History were reviewed and updated.  Review of Systems: As above  Physical Exam:  height is 5\' 6"  (1.676 m) and weight is 187 lb (84.823 kg). Her oral temperature is 97.4 F (36.3 C). Her blood pressure is 126/63 and her pulse is 60. Her respiration is 14.   Well-developed and well-nourished white female. She has no ocular or oral lesions. There are no palpable cervical or supraclavicular lymph nodes. Lungs are clear. Cardiac exam regular rate and rhythm with no murmurs, rubs or bruits. Abdomen is soft. She has good bowel sounds. There is no fluid wave. There is no palpable liver or spleen tip. Back exam shows no tenderness over the spine, ribs or hips. She does have a limited 3 scar in the lumbar spine. Extremities shows no clubbing, cyanosis or edema. She has good range motion of her joints. She has good strength. Skin exam shows no rashes, ecchymoses or petechia. Neurological exam is nonfocal.  Lab Results  Component Value Date   WBC 8.9 12/17/2014   HGB 13.7 12/17/2014   HCT 40.9 12/17/2014   MCV 90 12/17/2014   PLT 217 12/17/2014     Chemistry      Component Value Date/Time   NA 139 08/05/2014 0850   NA 145 01/15/2010 0835   K 3.8 08/05/2014 0850   K 4.2 01/15/2010 0835   CL 100 08/05/2014 0850   CL 103 01/15/2010 0835   CO2 24 08/05/2014 0850   CO2 30 01/15/2010 0835   BUN 13 08/05/2014 0850   BUN 16 01/15/2010 0835   CREATININE 1.03 08/05/2014 0850   CREATININE 0.9 01/15/2010 0835      Component Value Date/Time   CALCIUM 8.9 08/05/2014 0850   CALCIUM 9.9 01/15/2010 0835   ALKPHOS 96 08/05/2014 0850   ALKPHOS 109* 01/15/2010 0835   AST 18 08/05/2014 0850   AST 18 01/15/2010 0835   ALT 28 08/05/2014 0850   ALT 13 01/15/2010 0835   BILITOT 0.5 08/05/2014 0850   BILITOT 0.60 01/15/2010 0835      .   Impression and Plan: [name] is 69 year old white female. Her blood work looks pretty good. Her  hemoglobin is holding pretty steady. She got iron back in November. She responds well to this.  I told her about the M spike. She is worried about getting myeloma. I told her that her risk probably will be less than 10%.  I will plan to get her back in 6 months.  [name], MD 3/29/20169:27 AM

## 2014-12-19 LAB — PROTEIN ELECTROPHORESIS, SERUM, WITH REFLEX
ALBUMIN ELP: 3.9 g/dL (ref 3.8–4.8)
ALPHA-1-GLOBULIN: 0.3 g/dL (ref 0.2–0.3)
Abnormal Protein Band1: 0.4 g/dL
Alpha-2-Globulin: 0.9 g/dL (ref 0.5–0.9)
BETA 2: 0.6 g/dL — AB (ref 0.2–0.5)
Beta Globulin: 0.4 g/dL (ref 0.4–0.6)
Gamma Globulin: 1 g/dL (ref 0.8–1.7)
Total Protein, Serum Electrophoresis: 7 g/dL (ref 6.1–8.1)

## 2014-12-19 LAB — IFE INTERPRETATION

## 2014-12-19 LAB — IGG, IGA, IGM
IgA: 274 mg/dL (ref 69–380)
IgG (Immunoglobin G), Serum: 689 mg/dL — ABNORMAL LOW (ref 690–1700)
IgM, Serum: 598 mg/dL — ABNORMAL HIGH (ref 52–322)

## 2014-12-19 LAB — KAPPA/LAMBDA LIGHT CHAINS
KAPPA LAMBDA RATIO: 1.26 (ref 0.26–1.65)
Kappa free light chain: 2.13 mg/dL — ABNORMAL HIGH (ref 0.33–1.94)
Lambda Free Lght Chn: 1.69 mg/dL (ref 0.57–2.63)

## 2014-12-23 ENCOUNTER — Telehealth: Payer: Self-pay | Admitting: *Deleted

## 2014-12-23 NOTE — Telephone Encounter (Signed)
Patient called office concerning something she saw on MyChart. Returned pt's call. Patient wanted to know why she had chronic renal insufficiency in her medical history. Went over history with pt. Patient decided to speak to Dr. [name] about it at her next office visit.

## 2014-12-26 DIAGNOSIS — M7062 Trochanteric bursitis, left hip: Secondary | ICD-10-CM | POA: Diagnosis not present

## 2014-12-26 DIAGNOSIS — M5106 Intervertebral disc disorders with myelopathy, lumbar region: Secondary | ICD-10-CM | POA: Diagnosis not present

## 2015-01-02 DIAGNOSIS — M7062 Trochanteric bursitis, left hip: Secondary | ICD-10-CM | POA: Diagnosis not present

## 2015-01-08 DIAGNOSIS — R350 Frequency of micturition: Secondary | ICD-10-CM | POA: Diagnosis not present

## 2015-01-08 DIAGNOSIS — N3941 Urge incontinence: Secondary | ICD-10-CM | POA: Diagnosis not present

## 2015-01-08 DIAGNOSIS — N390 Urinary tract infection, site not specified: Secondary | ICD-10-CM | POA: Diagnosis not present

## 2015-01-09 DIAGNOSIS — M5126 Other intervertebral disc displacement, lumbar region: Secondary | ICD-10-CM | POA: Diagnosis not present

## 2015-01-09 DIAGNOSIS — Z6831 Body mass index (BMI) 31.0-31.9, adult: Secondary | ICD-10-CM | POA: Diagnosis not present

## 2015-01-10 ENCOUNTER — Other Ambulatory Visit: Payer: Self-pay | Admitting: Neurosurgery

## 2015-01-15 NOTE — Pre-Procedure Instructions (Signed)
[name]  01/15/2015   Your procedure is scheduled on:  Monday Jan 20, 2015 at 7:30 AM.  Report to [facility] Admitting at 5:30 AM.  Call this number if you have problems the morning of surgery: [phone]   Remember:   Do not eat food or drink liquids after midnight.   Take these medicines the morning of surgery with A SIP OF WATER: Alprazolam (Xanax) if needed, Nasal spray, Levothyroxine, Metoprolol (Lopressor), Oxycodone if needed, Solifenacin (Vesicare), Tramadol (Ultram) if needed   Do not wear jewelry, make-up or nail polish.  Do not wear lotions, powders, or perfumes. You may wear deodorant.  Do not shave 48 hours prior to surgery. Men may shave face and neck.  Do not bring valuables to the hospital.  [facility] is not responsible for any belongings or valuables.               Contacts, dentures or bridgework may not be worn into surgery.  Leave suitcase in the car. After surgery it may be brought to your room.  For patients admitted to the hospital, discharge time is determined by your treatment team.               Patients discharged the day of surgery will not be allowed to drive home.  Name and phone number of your driver:   Special Instructions: Shower using CHG soap the night before and the morning of your surgery   Please read over the following fact sheets that you were given: Pain Booklet, Coughing and Deep Breathing, Blood Transfusion Information, MRSA Information and Surgical Site Infection Prevention

## 2015-01-16 ENCOUNTER — Other Ambulatory Visit: Payer: Self-pay | Admitting: Family Medicine

## 2015-01-16 ENCOUNTER — Encounter (HOSPITAL_COMMUNITY): Payer: Self-pay

## 2015-01-16 ENCOUNTER — Encounter (HOSPITAL_COMMUNITY)
Admission: RE | Admit: 2015-01-16 | Discharge: 2015-01-16 | Disposition: A | Discharge status: Home / Self Care | Payer: Medicare Other | Source: Ambulatory Visit | Attending: Neurosurgery | Admitting: Neurosurgery

## 2015-01-16 DIAGNOSIS — E559 Vitamin D deficiency, unspecified: Secondary | ICD-10-CM | POA: Diagnosis not present

## 2015-01-16 DIAGNOSIS — I129 Hypertensive chronic kidney disease with stage 1 through stage 4 chronic kidney disease, or unspecified chronic kidney disease: Secondary | ICD-10-CM | POA: Diagnosis not present

## 2015-01-16 DIAGNOSIS — M5116 Intervertebral disc disorders with radiculopathy, lumbar region: Secondary | ICD-10-CM | POA: Diagnosis not present

## 2015-01-16 DIAGNOSIS — M5126 Other intervertebral disc displacement, lumbar region: Secondary | ICD-10-CM | POA: Diagnosis not present

## 2015-01-16 HISTORY — DX: Irritable bowel syndrome, unspecified: K58.9

## 2015-01-16 LAB — BASIC METABOLIC PANEL
Anion gap: 10 (ref 5–15)
BUN: 15 mg/dL (ref 6–23)
CALCIUM: 8.5 mg/dL (ref 8.4–10.5)
CO2: 22 mmol/L (ref 19–32)
CREATININE: 0.79 mg/dL (ref 0.50–1.10)
Chloride: 107 mmol/L (ref 96–112)
GFR calc Af Amer: >90 mL/min (ref >90)
GFR calc non Af Amer: 84 mL/min — ABNORMAL LOW (ref >90)
GLUCOSE: 107 mg/dL — AB (ref 70–99)
Potassium: 3.9 mmol/L (ref 3.5–5.1)
SODIUM: 139 mmol/L (ref 135–145)

## 2015-01-16 LAB — CBC
HEMATOCRIT: 42.9 % (ref 36.0–46.0)
Hemoglobin: 14.1 g/dL (ref 12.0–15.0)
MCH: 29.1 pg (ref 26.0–34.0)
MCHC: 32.9 g/dL (ref 30.0–36.0)
MCV: 88.6 fL (ref 78.0–100.0)
Platelets: 172 10*3/uL (ref 150–400)
RBC: 4.84 MIL/uL (ref 3.87–5.11)
RDW: 12.7 % (ref 11.5–15.5)
WBC: 9.3 10*3/uL (ref 4.0–10.5)

## 2015-01-16 LAB — TYPE AND SCREEN
ABO/RH(D): A POS
ANTIBODY SCREEN: NEGATIVE

## 2015-01-16 LAB — SURGICAL PCR SCREEN
MRSA, PCR: NEGATIVE
STAPHYLOCOCCUS AUREUS: NEGATIVE

## 2015-01-16 MED ORDER — LEVOTHYROXINE SODIUM 75 MCG PO CAPS: ORAL_CAPSULE | ORAL | Status: DC

## 2015-01-16 NOTE — Progress Notes (Signed)
Patient arrive to PAT via wheelchair. PCP is Shawnie Dapper. Patient informed Nurse that she had a stress test > 5 year ago, and that she had a cardiac cath at [facility] "a while ago". Patient denied having a sleep study or having any acute cardiac or pulmonary issues. Husband at chair side during PAT visit.

## 2015-01-16 NOTE — Telephone Encounter (Signed)
Pt requested rf of thyroid medication to be sent to [facility].  Rx sent.

## 2015-01-17 ENCOUNTER — Encounter (HOSPITAL_COMMUNITY): Payer: Self-pay

## 2015-01-17 DIAGNOSIS — M961 Postlaminectomy syndrome, not elsewhere classified: Secondary | ICD-10-CM | POA: Diagnosis not present

## 2015-01-17 DIAGNOSIS — K5900 Constipation, unspecified: Secondary | ICD-10-CM | POA: Diagnosis not present

## 2015-01-17 DIAGNOSIS — G894 Chronic pain syndrome: Secondary | ICD-10-CM | POA: Diagnosis not present

## 2015-01-17 DIAGNOSIS — M62830 Muscle spasm of back: Secondary | ICD-10-CM | POA: Diagnosis not present

## 2015-01-17 NOTE — Progress Notes (Signed)
Anesthesia Chart Review:  Patient is a 69 year old female scheduled for L2-3, L3-4 PLIF on 01/20/15 by [name].  History includes non-smoker, HTN, GERD, fibromyalgia, anxiety, depression, iron deficiency anemia, hypothyroidism, memory impairment, CKD stage II, IgM kappa monoclonal gammopathy of undetermined significance, IBS, moderate to severe OSA (per 2010 sleep study) without CPAP use, TKA, nasal sinus surgery, back surgery, hysterectomy, cholecystectomy, remote history of cath for chest pain (normal coronaries on 04/10/02). For anesthesia history, she reported post-operative N/V and personal and family history of prolonged emergence.  PCP is Dr. Shawnie Dapper. Hematologist is [name]. She has had periodic evaluations for chest pain (2003 with normal cath) and on 12/31/08 had a low risk stress test. No further work-up was recommended at that time (per Dr. Haroldine Laws). She was also followed ~ 2010-2012 at the former [facility] lipid clinic.   01/16/15 EKG: NSR, minimal voltage criteria for LVH, may be normal variant, non-specific ST abnormality. Confirming cardiologist did not feel that it was significantly changed since 10/02/12.    Preoperative labs noted.   If no acute changes then I would anticipate that she could proceed as planned.  George Hugh [facility]/Anesthesiology Phone [phone] 01/17/2015 10:50 AM

## 2015-01-19 MED ORDER — VANCOMYCIN HCL IN DEXTROSE 1-5 GM/200ML-% IV SOLN
1000.0000 mg | INTRAVENOUS | Status: AC
  Administered 2015-01-20: 1000 mg via INTRAVENOUS
  Filled 2015-01-19: qty 200

## 2015-01-20 ENCOUNTER — Inpatient Hospital Stay (HOSPITAL_COMMUNITY): Payer: Medicare Other | Admitting: Certified Registered Nurse Anesthetist

## 2015-01-20 ENCOUNTER — Encounter (HOSPITAL_COMMUNITY)
Admission: RE | Disposition: A | Discharge status: Home / Self Care | Payer: BLUE CROSS/BLUE SHIELD | Source: Ambulatory Visit | Attending: Neurosurgery

## 2015-01-20 ENCOUNTER — Inpatient Hospital Stay (HOSPITAL_COMMUNITY): Payer: Medicare Other | Admitting: Vascular Surgery

## 2015-01-20 ENCOUNTER — Inpatient Hospital Stay (HOSPITAL_COMMUNITY)
Admission: RE | Admit: 2015-01-20 | Discharge: 2015-01-22 | DRG: 460 | Disposition: A | Discharge status: Home / Self Care | Payer: Medicare Other | Source: Ambulatory Visit | Attending: Neurosurgery | Admitting: Neurosurgery

## 2015-01-20 ENCOUNTER — Inpatient Hospital Stay (HOSPITAL_COMMUNITY): Payer: Medicare Other

## 2015-01-20 ENCOUNTER — Encounter (HOSPITAL_COMMUNITY): Payer: Self-pay | Admitting: *Deleted

## 2015-01-20 DIAGNOSIS — K219 Gastro-esophageal reflux disease without esophagitis: Secondary | ICD-10-CM | POA: Diagnosis present

## 2015-01-20 DIAGNOSIS — Z981 Arthrodesis status: Secondary | ICD-10-CM

## 2015-01-20 DIAGNOSIS — G4733 Obstructive sleep apnea (adult) (pediatric): Secondary | ICD-10-CM | POA: Diagnosis present

## 2015-01-20 DIAGNOSIS — M5416 Radiculopathy, lumbar region: Secondary | ICD-10-CM | POA: Diagnosis not present

## 2015-01-20 DIAGNOSIS — I129 Hypertensive chronic kidney disease with stage 1 through stage 4 chronic kidney disease, or unspecified chronic kidney disease: Secondary | ICD-10-CM | POA: Diagnosis present

## 2015-01-20 DIAGNOSIS — Z01810 Encounter for preprocedural cardiovascular examination: Secondary | ICD-10-CM

## 2015-01-20 DIAGNOSIS — N182 Chronic kidney disease, stage 2 (mild): Secondary | ICD-10-CM | POA: Diagnosis present

## 2015-01-20 DIAGNOSIS — Z79899 Other long term (current) drug therapy: Secondary | ICD-10-CM | POA: Diagnosis not present

## 2015-01-20 DIAGNOSIS — Z96659 Presence of unspecified artificial knee joint: Secondary | ICD-10-CM | POA: Diagnosis present

## 2015-01-20 DIAGNOSIS — F419 Anxiety disorder, unspecified: Secondary | ICD-10-CM | POA: Diagnosis present

## 2015-01-20 DIAGNOSIS — Z01812 Encounter for preprocedural laboratory examination: Secondary | ICD-10-CM

## 2015-01-20 DIAGNOSIS — M51369 Other intervertebral disc degeneration, lumbar region without mention of lumbar back pain or lower extremity pain: Secondary | ICD-10-CM | POA: Diagnosis present

## 2015-01-20 DIAGNOSIS — G8929 Other chronic pain: Secondary | ICD-10-CM | POA: Diagnosis not present

## 2015-01-20 DIAGNOSIS — M5136 Other intervertebral disc degeneration, lumbar region: Secondary | ICD-10-CM | POA: Diagnosis not present

## 2015-01-20 DIAGNOSIS — M4326 Fusion of spine, lumbar region: Secondary | ICD-10-CM | POA: Diagnosis not present

## 2015-01-20 DIAGNOSIS — M5126 Other intervertebral disc displacement, lumbar region: Secondary | ICD-10-CM | POA: Diagnosis present

## 2015-01-20 DIAGNOSIS — E559 Vitamin D deficiency, unspecified: Secondary | ICD-10-CM | POA: Diagnosis present

## 2015-01-20 DIAGNOSIS — E039 Hypothyroidism, unspecified: Secondary | ICD-10-CM | POA: Diagnosis present

## 2015-01-20 DIAGNOSIS — E780 Pure hypercholesterolemia: Secondary | ICD-10-CM | POA: Diagnosis present

## 2015-01-20 DIAGNOSIS — M797 Fibromyalgia: Secondary | ICD-10-CM | POA: Diagnosis present

## 2015-01-20 DIAGNOSIS — M4806 Spinal stenosis, lumbar region: Secondary | ICD-10-CM | POA: Diagnosis present

## 2015-01-20 DIAGNOSIS — M4316 Spondylolisthesis, lumbar region: Secondary | ICD-10-CM | POA: Diagnosis present

## 2015-01-20 DIAGNOSIS — Z419 Encounter for procedure for purposes other than remedying health state, unspecified: Secondary | ICD-10-CM

## 2015-01-20 DIAGNOSIS — M48062 Spinal stenosis, lumbar region with neurogenic claudication: Secondary | ICD-10-CM | POA: Diagnosis present

## 2015-01-20 DIAGNOSIS — K589 Irritable bowel syndrome without diarrhea: Secondary | ICD-10-CM | POA: Diagnosis not present

## 2015-01-20 DIAGNOSIS — M5116 Intervertebral disc disorders with radiculopathy, lumbar region: Principal | ICD-10-CM | POA: Diagnosis present

## 2015-01-20 SURGERY — POSTERIOR LUMBAR FUSION 2 LEVEL
Anesthesia: General | Site: Spine Lumbar

## 2015-01-20 MED ORDER — FENTANYL CITRATE (PF) 250 MCG/5ML IJ SOLN
INTRAMUSCULAR | Status: AC
  Filled 2015-01-20: qty 5

## 2015-01-20 MED ORDER — LIDOCAINE HCL (CARDIAC) 20 MG/ML IV SOLN
INTRAVENOUS | Status: AC
  Filled 2015-01-20: qty 15

## 2015-01-20 MED ORDER — THROMBIN 5000 UNITS EX SOLR
CUTANEOUS | Status: DC | PRN
  Administered 2015-01-20 (×4): 5000 [IU] via TOPICAL

## 2015-01-20 MED ORDER — GLYCOPYRROLATE 0.2 MG/ML IJ SOLN
INTRAMUSCULAR | Status: AC
  Filled 2015-01-20: qty 1

## 2015-01-20 MED ORDER — NEOSTIGMINE METHYLSULFATE 10 MG/10ML IV SOLN
INTRAVENOUS | Status: AC
  Filled 2015-01-20: qty 2

## 2015-01-20 MED ORDER — BUPIVACAINE HCL (PF) 0.25 % IJ SOLN
INTRAMUSCULAR | Status: DC | PRN
  Administered 2015-01-20: 19 mL

## 2015-01-20 MED ORDER — NEOSTIGMINE METHYLSULFATE 10 MG/10ML IV SOLN
INTRAVENOUS | Status: DC | PRN
  Administered 2015-01-20: 2 mg via INTRAVENOUS
  Administered 2015-01-20: 3 mg via INTRAVENOUS

## 2015-01-20 MED ORDER — SODIUM CHLORIDE 0.9 % IR SOLN
Status: DC | PRN
  Administered 2015-01-20: 500 mL

## 2015-01-20 MED ORDER — HEMOSTATIC AGENTS (NO CHARGE) OPTIME
TOPICAL | Status: DC | PRN
  Administered 2015-01-20 (×2): 1 via TOPICAL

## 2015-01-20 MED ORDER — ACETAMINOPHEN 650 MG RE SUPP: 650.0000 mg | RECTAL | Status: DC | PRN

## 2015-01-20 MED ORDER — PHENYLEPHRINE 40 MCG/ML (10ML) SYRINGE FOR IV PUSH (FOR BLOOD PRESSURE SUPPORT)
PREFILLED_SYRINGE | INTRAVENOUS | Status: AC
Start: 2015-01-20 — End: 2015-01-20
  Filled 2015-01-20: qty 10

## 2015-01-20 MED ORDER — VANCOMYCIN HCL IN DEXTROSE 1-5 GM/200ML-% IV SOLN
1000.0000 mg | Freq: Two times a day (BID) | INTRAVENOUS | Status: DC
  Administered 2015-01-20 – 2015-01-22 (×4): 1000 mg via INTRAVENOUS
  Filled 2015-01-20 (×6): qty 200

## 2015-01-20 MED ORDER — SIMVASTATIN 40 MG PO TABS
40.0000 mg | ORAL_TABLET | Freq: Every day | ORAL | Status: DC
  Administered 2015-01-20 – 2015-01-21 (×2): 40 mg via ORAL
  Filled 2015-01-20 (×3): qty 1

## 2015-01-20 MED ORDER — SODIUM CHLORIDE 0.9 % IJ SOLN
3.0000 mL | Freq: Two times a day (BID) | INTRAMUSCULAR | Status: DC
  Administered 2015-01-20 – 2015-01-21 (×3): 3 mL via INTRAVENOUS

## 2015-01-20 MED ORDER — ARTIFICIAL TEARS OP OINT
TOPICAL_OINTMENT | OPHTHALMIC | Status: AC
  Filled 2015-01-20: qty 7

## 2015-01-20 MED ORDER — PROPOFOL 10 MG/ML IV BOLUS
INTRAVENOUS | Status: AC
  Filled 2015-01-20: qty 20

## 2015-01-20 MED ORDER — ALPRAZOLAM 0.5 MG PO TABS: 0.5000 mg | ORAL_TABLET | Freq: Three times a day (TID) | ORAL | Status: DC | PRN

## 2015-01-20 MED ORDER — ONDANSETRON HCL 4 MG/2ML IJ SOLN
INTRAMUSCULAR | Status: AC
  Filled 2015-01-20: qty 2

## 2015-01-20 MED ORDER — DARIFENACIN HYDROBROMIDE ER 7.5 MG PO TB24
7.5000 mg | ORAL_TABLET | Freq: Every day | ORAL | Status: DC
  Administered 2015-01-21: 7.5 mg via ORAL
  Filled 2015-01-20 (×2): qty 1

## 2015-01-20 MED ORDER — ROCURONIUM BROMIDE 100 MG/10ML IV SOLN
INTRAVENOUS | Status: DC | PRN
  Administered 2015-01-20: 10 mg via INTRAVENOUS
  Administered 2015-01-20: 50 mg via INTRAVENOUS
  Administered 2015-01-20 (×2): 10 mg via INTRAVENOUS

## 2015-01-20 MED ORDER — LISINOPRIL 10 MG PO TABS
10.0000 mg | ORAL_TABLET | Freq: Every day | ORAL | Status: DC
  Administered 2015-01-21: 10 mg via ORAL
  Filled 2015-01-20 (×2): qty 1

## 2015-01-20 MED ORDER — ROCURONIUM BROMIDE 50 MG/5ML IV SOLN
INTRAVENOUS | Status: AC
  Filled 2015-01-20: qty 2

## 2015-01-20 MED ORDER — HYDROMORPHONE HCL 1 MG/ML IJ SOLN
0.2500 mg | INTRAMUSCULAR | Status: DC | PRN
  Administered 2015-01-20 (×2): 0.5 mg via INTRAVENOUS

## 2015-01-20 MED ORDER — HYDROMORPHONE HCL 1 MG/ML IJ SOLN
0.5000 mg | INTRAMUSCULAR | Status: DC | PRN
  Administered 2015-01-20 – 2015-01-22 (×5): 1 mg via INTRAVENOUS
  Filled 2015-01-20 (×5): qty 1

## 2015-01-20 MED ORDER — ONDANSETRON HCL 4 MG/2ML IJ SOLN
INTRAMUSCULAR | Status: AC
  Filled 2015-01-20: qty 8

## 2015-01-20 MED ORDER — HYDROMORPHONE HCL 1 MG/ML IJ SOLN
INTRAMUSCULAR | Status: AC
  Administered 2015-01-20: 0.5 mg via INTRAVENOUS
  Filled 2015-01-20: qty 1

## 2015-01-20 MED ORDER — LACTATED RINGERS IV SOLN
INTRAVENOUS | Status: DC | PRN
  Administered 2015-01-20 (×2): via INTRAVENOUS

## 2015-01-20 MED ORDER — VANCOMYCIN HCL 1000 MG IV SOLR
INTRAVENOUS | Status: DC | PRN
  Administered 2015-01-20: 1000 mg via TOPICAL

## 2015-01-20 MED ORDER — AZELASTINE HCL 0.15 % NA SOLN: 2.0000 | Freq: Two times a day (BID) | NASAL | Status: DC | PRN

## 2015-01-20 MED ORDER — METOPROLOL TARTRATE 12.5 MG HALF TABLET
12.5000 mg | ORAL_TABLET | Freq: Every morning | ORAL | Status: DC
  Administered 2015-01-21: 12.5 mg via ORAL
  Filled 2015-01-20 (×2): qty 1

## 2015-01-20 MED ORDER — LIDOCAINE HCL (CARDIAC) 20 MG/ML IV SOLN
INTRAVENOUS | Status: DC | PRN
Start: 2015-01-20 — End: 2015-01-20
  Administered 2015-01-20: 100 mg via INTRAVENOUS

## 2015-01-20 MED ORDER — PROMETHAZINE HCL 25 MG PO TABS
25.0000 mg | ORAL_TABLET | Freq: Four times a day (QID) | ORAL | Status: DC | PRN
  Administered 2015-01-21: 25 mg via ORAL
  Filled 2015-01-20: qty 1

## 2015-01-20 MED ORDER — MENTHOL 3 MG MT LOZG: 1.0000 | LOZENGE | OROMUCOSAL | Status: DC | PRN

## 2015-01-20 MED ORDER — ACETAMINOPHEN 325 MG PO TABS: 650.0000 mg | ORAL_TABLET | ORAL | Status: DC | PRN

## 2015-01-20 MED ORDER — OXYCODONE HCL 5 MG PO TABS
5.0000 mg | ORAL_TABLET | ORAL | Status: DC | PRN
  Administered 2015-01-21: 5 mg via ORAL
  Filled 2015-01-20: qty 1

## 2015-01-20 MED ORDER — DEXAMETHASONE SODIUM PHOSPHATE 4 MG/ML IJ SOLN
INTRAMUSCULAR | Status: AC
  Filled 2015-01-20: qty 2

## 2015-01-20 MED ORDER — TRAMADOL HCL 50 MG PO TABS: 50.0000 mg | ORAL_TABLET | Freq: Four times a day (QID) | ORAL | Status: DC | PRN

## 2015-01-20 MED ORDER — GLYCOPYRROLATE 0.2 MG/ML IJ SOLN
INTRAMUSCULAR | Status: AC
  Filled 2015-01-20: qty 4

## 2015-01-20 MED ORDER — GLYCOPYRROLATE 0.2 MG/ML IJ SOLN
INTRAMUSCULAR | Status: DC | PRN
  Administered 2015-01-20 (×2): .4 mg via INTRAVENOUS

## 2015-01-20 MED ORDER — MIDAZOLAM HCL 5 MG/5ML IJ SOLN
INTRAMUSCULAR | Status: DC | PRN
  Administered 2015-01-20: 2 mg via INTRAVENOUS

## 2015-01-20 MED ORDER — MECLIZINE HCL 25 MG PO TABS: 25.0000 mg | ORAL_TABLET | Freq: Four times a day (QID) | ORAL | Status: DC | PRN

## 2015-01-20 MED ORDER — SODIUM CHLORIDE 0.9 % IV SOLN: 250.0000 mL | INTRAVENOUS | Status: DC

## 2015-01-20 MED ORDER — VANCOMYCIN HCL 1000 MG IV SOLR
INTRAVENOUS | Status: AC
  Filled 2015-01-20: qty 1000

## 2015-01-20 MED ORDER — PHENOL 1.4 % MT LIQD: 1.0000 | OROMUCOSAL | Status: DC | PRN

## 2015-01-20 MED ORDER — DIAZEPAM 5 MG PO TABS
5.0000 mg | ORAL_TABLET | Freq: Four times a day (QID) | ORAL | Status: DC | PRN
  Administered 2015-01-20 – 2015-01-22 (×6): 5 mg via ORAL
  Filled 2015-01-20 (×6): qty 1

## 2015-01-20 MED ORDER — OXYCODONE-ACETAMINOPHEN 5-325 MG PO TABS
1.0000 | ORAL_TABLET | ORAL | Status: DC | PRN
  Administered 2015-01-20 – 2015-01-21 (×4): 2 via ORAL
  Administered 2015-01-21: 1 via ORAL
  Administered 2015-01-21 – 2015-01-22 (×4): 2 via ORAL
  Filled 2015-01-20 (×6): qty 2
  Filled 2015-01-20: qty 1
  Filled 2015-01-20 (×2): qty 2

## 2015-01-20 MED ORDER — INOSITOL NIACINATE 500 MG PO CAPS: 1.0000 | ORAL_CAPSULE | Freq: Every day | ORAL | Status: DC

## 2015-01-20 MED ORDER — DEXAMETHASONE SODIUM PHOSPHATE 4 MG/ML IJ SOLN
INTRAMUSCULAR | Status: DC | PRN
Start: 2015-01-20 — End: 2015-01-20
  Administered 2015-01-20: 4 mg via INTRAVENOUS

## 2015-01-20 MED ORDER — DIPHENHYDRAMINE HCL 25 MG PO TABS
25.0000 mg | ORAL_TABLET | Freq: Every evening | ORAL | Status: DC | PRN
  Filled 2015-01-20: qty 1

## 2015-01-20 MED ORDER — FENTANYL CITRATE (PF) 100 MCG/2ML IJ SOLN
INTRAMUSCULAR | Status: DC | PRN
  Administered 2015-01-20: 100 ug via INTRAVENOUS
  Administered 2015-01-20: 50 ug via INTRAVENOUS
  Administered 2015-01-20: 100 ug via INTRAVENOUS
  Administered 2015-01-20 (×2): 50 ug via INTRAVENOUS

## 2015-01-20 MED ORDER — ONDANSETRON HCL 4 MG/2ML IJ SOLN
INTRAMUSCULAR | Status: DC | PRN
  Administered 2015-01-20 (×2): 4 mg via INTRAVENOUS

## 2015-01-20 MED ORDER — ARTIFICIAL TEARS OP OINT
TOPICAL_OINTMENT | OPHTHALMIC | Status: DC | PRN
Start: 2015-01-20 — End: 2015-01-20
  Administered 2015-01-20: 1 via OPHTHALMIC

## 2015-01-20 MED ORDER — ONDANSETRON HCL 4 MG/2ML IJ SOLN: 4.0000 mg | INTRAMUSCULAR | Status: DC | PRN

## 2015-01-20 MED ORDER — ONDANSETRON HCL 4 MG/2ML IJ SOLN: 4.0000 mg | Freq: Once | INTRAMUSCULAR | Status: DC | PRN

## 2015-01-20 MED ORDER — SUCCINYLCHOLINE CHLORIDE 20 MG/ML IJ SOLN
INTRAMUSCULAR | Status: AC
  Filled 2015-01-20: qty 1

## 2015-01-20 MED ORDER — LEVOTHYROXINE SODIUM 75 MCG PO TABS
37.5000 ug | ORAL_TABLET | Freq: Every morning | ORAL | Status: DC
  Administered 2015-01-21: 37.5 ug via ORAL
  Filled 2015-01-20 (×2): qty 0.5

## 2015-01-20 MED ORDER — SODIUM CHLORIDE 0.9 % IJ SOLN: 3.0000 mL | INTRAMUSCULAR | Status: DC | PRN

## 2015-01-20 MED ORDER — MIDAZOLAM HCL 2 MG/2ML IJ SOLN
INTRAMUSCULAR | Status: AC
  Filled 2015-01-20: qty 2

## 2015-01-20 MED ORDER — 0.9 % SODIUM CHLORIDE (POUR BTL) OPTIME
TOPICAL | Status: DC | PRN
  Administered 2015-01-20: 1000 mL

## 2015-01-20 MED ORDER — PROPOFOL 10 MG/ML IV BOLUS
INTRAVENOUS | Status: DC | PRN
  Administered 2015-01-20: 150 mg via INTRAVENOUS

## 2015-01-20 MED ORDER — SODIUM CHLORIDE 0.9 % IV SOLN
INTRAVENOUS | Status: DC | PRN
  Administered 2015-01-20: 10:00:00 via INTRAVENOUS

## 2015-01-20 MED ORDER — HYDROCODONE-ACETAMINOPHEN 5-325 MG PO TABS
1.0000 | ORAL_TABLET | ORAL | Status: DC | PRN
  Administered 2015-01-22: 2 via ORAL
  Filled 2015-01-20: qty 2

## 2015-01-20 SURGICAL SUPPLY — 68 items
APL SKNCLS STERI-STRIP NONHPOA (GAUZE/BANDAGES/DRESSINGS) ×1
BAG DECANTER FOR FLEXI CONT (MISCELLANEOUS) ×3 IMPLANT
BENZOIN TINCTURE PRP APPL 2/3 (GAUZE/BANDAGES/DRESSINGS) ×3 IMPLANT
BLADE CLIPPER SURG (BLADE) IMPLANT
BRUSH SCRUB EZ PLAIN DRY (MISCELLANEOUS) ×3 IMPLANT
BUR MATCHSTICK NEURO 3.0 LAGG (BURR) ×3 IMPLANT
CAGE CAPSTONE CONTROL 12X22X6 (Cage) ×6 IMPLANT
CANISTER SUCT 3000ML PPV (MISCELLANEOUS) ×3 IMPLANT
CAP LCK SPNE (Orthopedic Implant) ×6 IMPLANT
CAP LOCK SPINE RADIUS (Orthopedic Implant) ×6 IMPLANT
CAP LOCKING (Orthopedic Implant) ×18 IMPLANT
CLOSURE WOUND 1/2 X4 (GAUZE/BANDAGES/DRESSINGS) ×2
CONT SPEC 4OZ CLIKSEAL STRL BL (MISCELLANEOUS) ×6 IMPLANT
COVER BACK TABLE 60X90IN (DRAPES) ×3 IMPLANT
CROSSLINK VARIABLE M-A (Orthopedic Implant) ×3 IMPLANT
DECANTER SPIKE VIAL GLASS SM (MISCELLANEOUS) IMPLANT
DRAPE C-ARM 42X72 X-RAY (DRAPES) ×6 IMPLANT
DRAPE LAPAROTOMY 100X72X124 (DRAPES) ×3 IMPLANT
DRAPE POUCH INSTRU U-SHP 10X18 (DRAPES) ×3 IMPLANT
DRAPE PROXIMA HALF (DRAPES) IMPLANT
DRAPE SURG 17X23 STRL (DRAPES) ×12 IMPLANT
DRSG OPSITE 4X5.5 SM (GAUZE/BANDAGES/DRESSINGS) ×3 IMPLANT
DRSG OPSITE POSTOP 4X8 (GAUZE/BANDAGES/DRESSINGS) ×3 IMPLANT
DURAPREP 26ML APPLICATOR (WOUND CARE) ×3 IMPLANT
ELECT REM PT RETURN 9FT ADLT (ELECTROSURGICAL) ×3
ELECTRODE REM PT RTRN 9FT ADLT (ELECTROSURGICAL) ×1 IMPLANT
EVACUATOR 1/8 PVC DRAIN (DRAIN) ×3 IMPLANT
GAUZE SPONGE 4X4 12PLY STRL (GAUZE/BANDAGES/DRESSINGS) IMPLANT
GAUZE SPONGE 4X4 16PLY XRAY LF (GAUZE/BANDAGES/DRESSINGS) IMPLANT
GLOVE BIO SURGEON STRL SZ7 (GLOVE) ×6 IMPLANT
GLOVE BIOGEL PI IND STRL 7.5 (GLOVE) ×1 IMPLANT
GLOVE BIOGEL PI IND STRL 8 (GLOVE) ×1 IMPLANT
GLOVE BIOGEL PI INDICATOR 7.5 (GLOVE) ×2
GLOVE BIOGEL PI INDICATOR 8 (GLOVE) ×2
GLOVE ECLIPSE 7.5 STRL STRAW (GLOVE) ×3 IMPLANT
GLOVE ECLIPSE 9.0 STRL (GLOVE) ×6 IMPLANT
GLOVE EXAM NITRILE LRG STRL (GLOVE) IMPLANT
GLOVE EXAM NITRILE MD LF STRL (GLOVE) IMPLANT
GLOVE EXAM NITRILE XL STR (GLOVE) IMPLANT
GLOVE EXAM NITRILE XS STR PU (GLOVE) IMPLANT
GLOVE SURG SS PI 7.0 STRL IVOR (GLOVE) ×6 IMPLANT
GOWN STRL REUS W/ TWL LRG LVL3 (GOWN DISPOSABLE) ×1 IMPLANT
GOWN STRL REUS W/ TWL XL LVL3 (GOWN DISPOSABLE) ×3 IMPLANT
GOWN STRL REUS W/TWL 2XL LVL3 (GOWN DISPOSABLE) IMPLANT
GOWN STRL REUS W/TWL LRG LVL3 (GOWN DISPOSABLE) ×3
GOWN STRL REUS W/TWL XL LVL3 (GOWN DISPOSABLE) ×9
KIT BASIN OR (CUSTOM PROCEDURE TRAY) ×3 IMPLANT
KIT ROOM TURNOVER OR (KITS) ×3 IMPLANT
LIQUID BAND (GAUZE/BANDAGES/DRESSINGS) ×3 IMPLANT
NEEDLE HYPO 22GX1.5 SAFETY (NEEDLE) ×3 IMPLANT
NS IRRIG 1000ML POUR BTL (IV SOLUTION) ×3 IMPLANT
PACK LAMINECTOMY NEURO (CUSTOM PROCEDURE TRAY) ×3 IMPLANT
ROD 70MM (Rod) ×3 IMPLANT
ROD 80MM (Rod) ×3 IMPLANT
ROD SPNL 70X5.5XNS TI RDS (Rod) ×1 IMPLANT
ROD SPNL 80X5.5 NS TI RDS (Rod) ×1 IMPLANT
SCREW 5.75X40M (Screw) ×12 IMPLANT
SPONGE SURGIFOAM ABS GEL 100 (HEMOSTASIS) ×3 IMPLANT
STRIP CLOSURE SKIN 1/2X4 (GAUZE/BANDAGES/DRESSINGS) ×4 IMPLANT
SUT VIC AB 0 CT1 18XCR BRD8 (SUTURE) ×2 IMPLANT
SUT VIC AB 0 CT1 8-18 (SUTURE) ×6
SUT VIC AB 2-0 CT1 18 (SUTURE) ×6 IMPLANT
SUT VIC AB 3-0 SH 8-18 (SUTURE) ×6 IMPLANT
SYR 20ML ECCENTRIC (SYRINGE) ×3 IMPLANT
TOWEL OR 17X24 6PK STRL BLUE (TOWEL DISPOSABLE) ×3 IMPLANT
TOWEL OR 17X26 10 PK STRL BLUE (TOWEL DISPOSABLE) ×3 IMPLANT
TRAY FOLEY CATH 14FRSI W/METER (CATHETERS) ×3 IMPLANT
WATER STERILE IRR 1000ML POUR (IV SOLUTION) ×3 IMPLANT

## 2015-01-20 NOTE — Anesthesia Preprocedure Evaluation (Addendum)
Anesthesia Evaluation  Patient identified by MRN, date of birth, ID band Patient awake    Reviewed: Allergy & Precautions, NPO status , Patient's Chart, lab work & pertinent test results  History of Anesthesia Complications (+) PONV  Airway Mallampati: II  TM Distance: >3 FB Neck ROM: Full    Dental   Pulmonary sleep apnea ,    Pulmonary exam normal       Cardiovascular hypertension, Pt. on medications     Neuro/Psych    GI/Hepatic GERD-  Medicated and Controlled,  Endo/Other    Renal/GU Renal diseaseCr - 0.79     Musculoskeletal   Abdominal   Peds  Hematology   Anesthesia Other Findings   Reproductive/Obstetrics                            Anesthesia Physical Anesthesia Plan  ASA: III  Anesthesia Plan: General   Post-op Pain Management:    Induction: Intravenous  Airway Management Planned: Oral ETT  Additional Equipment:   Intra-op Plan:   Post-operative Plan: Extubation in OR  Informed Consent: I have reviewed the patients History and Physical, chart, labs and discussed the procedure including the risks, benefits and alternatives for the proposed anesthesia with the patient or authorized representative who has indicated his/her understanding and acceptance.     Plan Discussed with: CRNA and Surgeon  Anesthesia Plan Comments:         Anesthesia Quick Evaluation

## 2015-01-20 NOTE — Evaluation (Signed)
Physical Therapy Evaluation Patient Details Name: [name] MRN: [id number] DOB: Feb 18, 1946 Today's Date: 01/20/2015   History of Present Illness  69 yo patient with complex medical history including R TKA, presents now s/p Lumbar two-three Lumbar three-four Posterior lumbar interbody fusion with hardware removal.   Clinical Impression  Patient demonstrates deficits in functional mobility as indicated below. Will need continued skilled PT to address deficits and maximize function. Will see as indicated and progress as tolerated.  OF NOTE: patient became extremely anxious and flush with standing, reported dizziness and nausea. Unable to tolerate further activity. Unable to ambulate or transfer OOB to chair.  Provided extensive education prior to mobility regarding restrictions, mobility expectations, pain management, and brace application.    Follow Up Recommendations Supervision/Assistance - 24 hour    Equipment Recommendations  None recommended by PT    Recommendations for Other Services       Precautions / Restrictions Precautions Precautions: Back;Fall Precaution Booklet Issued: Yes (comment) Precaution Comments: verbally reviewed with patient Required Braces or Orthoses: Spinal Brace Spinal Brace: Lumbar corset Restrictions Weight Bearing Restrictions: No      Mobility  Bed Mobility Overal bed mobility: Needs Assistance Bed Mobility: Rolling;Sidelying to Sit;Sit to Sidelying Rolling: Min assist Sidelying to sit: Mod assist     Sit to sidelying: Mod assist General bed mobility comments: VCs for technique and sequencing  Transfers Overall transfer level: Needs assistance Equipment used: Rolling walker (2 wheeled) Transfers: Sit to/from Stand Sit to Stand: Min assist         General transfer comment: VCs for hand placement and safety  Ambulation/Gait                Stairs            Wheelchair Mobility    Modified Rankin (Stroke Patients  Only)       Balance Overall balance assessment: Needs assistance Sitting-balance support: Feet supported Sitting balance-Leahy Scale: Fair     Standing balance support: Bilateral upper extremity supported;During functional activity Standing balance-Leahy Scale: Poor                               Pertinent Vitals/Pain Pain Assessment: 0-10 Pain Score: 7  Pain Location: back Pain Descriptors / Indicators: Aching;Discomfort;Sharp Pain Intervention(s): Limited activity within patient's tolerance    Home Living Family/patient expects to be discharged to:: Private residence Living Arrangements: Spouse/significant other Available Help at Discharge: Family Type of Home: House Home Access: Stairs to enter Entrance Stairs-Rails: None Entrance Stairs-Number of Steps: 2 Home Layout: One level Home Equipment: Environmental consultant - 2 wheels;Cane - single point;Wheelchair - manual;Bedside commode      Prior Function Level of Independence: Independent               Hand Dominance   Dominant Hand: Right    Extremity/Trunk Assessment   Upper Extremity Assessment: Defer to OT evaluation           Lower Extremity Assessment: Generalized weakness;RLE deficits/detail RLE Deficits / Details: limited ROM from previous TKA with residual deficits       Communication   Communication: No difficulties  Cognition Arousal/Alertness: Awake/alert Behavior During Therapy: Anxious Overall Cognitive Status: Within Functional Limits for tasks assessed                      General Comments      Exercises  Assessment/Plan    PT Assessment Patient needs continued PT services  PT Diagnosis Difficulty walking;Abnormality of gait;Generalized weakness;Acute pain   PT Problem List Decreased strength;Decreased range of motion;Decreased activity tolerance;Decreased balance;Decreased mobility;Decreased coordination;Pain  PT Treatment Interventions DME instruction;Gait  training;Stair training;Functional mobility training;Therapeutic activities;Therapeutic exercise;Balance training;Patient/family education   PT Goals (Current goals can be found in the Care Plan section) Acute Rehab PT Goals Patient Stated Goal: to go home PT Goal Formulation: With patient/family Time For Goal Achievement: 02/03/15 Potential to Achieve Goals: Good    Frequency Min 5X/week   Barriers to discharge        Co-evaluation               End of Session Equipment Utilized During Treatment: Gait belt;Back brace Activity Tolerance: Patient limited by pain;Treatment limited secondary to medical complications (Comment) (patient reports dizziness and nausea ) Patient left: in bed;with call bell/phone within reach;with family/visitor present Nurse Communication: Mobility status         Time: 1526-1550 PT Time Calculation (min) (ACUTE ONLY): 24 min   Charges:   PT Evaluation $Initial PT Evaluation Tier I: 1 Procedure PT Treatments $Therapeutic Activity: 8-22 mins $Self Care/Home Management: 8-22   PT G CodesDuncan Dull 02-08-2015, 4:50 PM Alben Deeds, [place] DPT  [phone]

## 2015-01-20 NOTE — Transfer of Care (Signed)
Immediate Anesthesia Transfer of Care Note  Patient: [name]  Procedure(s) Performed: Procedure(s): Lumbar two-three Lumbar three-four Posterior lumbar interbody fusion with hardware removal  (N/A)  Patient Location: PACU  Anesthesia Type:General  Level of Consciousness: awake, alert  and oriented  Airway & Oxygen Therapy: Patient Spontanous Breathing and Patient connected to nasal cannula oxygen  Post-op Assessment: Report given to RN and Post -op Vital signs reviewed and stable  Post vital signs: Reviewed and stable  Last Vitals:  Filed Vitals:   01/20/15 0552  BP: 164/80  Pulse: 59  Temp: 36.6 C  Resp: 20    Complications: No apparent anesthesia complications   Pt denies pain

## 2015-01-20 NOTE — Progress Notes (Signed)
Orthopedic Tech Progress Note Patient Details:  [name] 1945/11/25 [id number]  Patient ID: [name], female   DOB: [birth year], 69 y.o.   MRN: [id number]   [name] 01/20/2015, 12:22 PMCalled bio-tech for lumbar corset.

## 2015-01-20 NOTE — Progress Notes (Signed)
Utilization review completed.  

## 2015-01-20 NOTE — Progress Notes (Signed)
ANTIBIOTIC CONSULT NOTE - INITIAL  Pharmacy Consult for vancomycin  Indication:   Allergies  Allergen Reactions  . Cymbalta [Duloxetine Hcl]     Facial edema  . Iohexol      Code: HIVES, Desc: pt states her face and throat swells when administered IV contrast - KB, Onset Date: 17616073   . Mepergan [Meperidine-Promethazine]     Cardiac arrest  . Augmentin [Amoxicillin-Pot Clavulanate] Nausea Only  . Citalopram Other (See Comments)    Jittery/heart racing   . Hctz [Hydrochlorothiazide] Other (See Comments)    Too much urinating, felt dried out, etc--NO ALLERGY  . Lexapro [Escitalopram Oxalate] Other (See Comments)    Jittery,heart racing  . Lyrica [Pregabalin] Swelling    Face, hands, fingers, ankles  . Valsartan     REACTION: pt states INTOL to Diovan    Patient Measurements: Height: 5\' 6"  (167.6 cm) Weight: 191 lb 6 oz (86.807 kg) IBW/kg (Calculated) : 59.3   Vital Signs: Temp: 97.7 F (36.5 C) (05/02 1146) Temp Source: Oral (05/02 0552) BP: 128/54 mmHg (05/02 1146) Pulse Rate: 62 (05/02 1146) Intake/Output from previous day:   Intake/Output from this shift: Total I/O In: 1390 [I.V.:1300; Blood:90] Out: 515 [Urine:215; Blood:300]  Labs: No results for input(s): WBC, HGB, PLT, LABCREA, CREATININE in the last 72 hours. Estimated Creatinine Clearance: 74.7 mL/min (by C-G formula based on Cr of 0.79). No results for input(s): VANCOTROUGH, VANCOPEAK, VANCORANDOM, GENTTROUGH, GENTPEAK, GENTRANDOM, TOBRATROUGH, TOBRAPEAK, TOBRARND, AMIKACINPEAK, AMIKACINTROU, AMIKACIN in the last 72 hours.   Microbiology: Recent Results (from the past 720 hour(s))  Surgical pcr screen     Status: None   Collection Time: 01/16/15  4:09 PM  Result Value Ref Range Status   MRSA, PCR NEGATIVE NEGATIVE Final   Staphylococcus aureus NEGATIVE NEGATIVE Final    Comment:        The Xpert SA Assay (FDA approved for NASAL specimens in patients over 85 years of age), is one component  of a comprehensive surveillance program.  Test performance has been validated by [facility] for patients greater than or equal to 9 year old. It is not intended to diagnose infection nor to guide or monitor treatment.     Medical History: Past Medical History  Diagnosis Date  . Allergic rhinitis   . HTN (hypertension)   . Chest pain     cr  . Palpitations   . Hypercholesterolemia   . GERD (gastroesophageal reflux disease)   . Diverticulosis of colon   . History of colonic polyps   . DJD (degenerative joint disease)   . Lumbar back pain   . Fibromyalgia   . Osteopenia 04/2014    T score -2.0 X. 11%/1.8%  . Vitamin D deficiency   . Anxiety   . Depression   . Psoriasis   . Unspecified disorder of plasma protein metabolism     IgM kappa MGUS ([name]) routine f/u has shown stability of this problem.  . IC (interstitial cystitis)     [name] at [place]  . Iron deficiency anemia, unspecified 06/27/2013  . Memory impairment 10/03/2012    Memory impairment 10/03/2012 >referral to neuro    . Hypothyroid 07/05/2012    TSH ~5.92 >begin Synthroid 2mcg daily 07/05/2012  TSH 2.42 09/28/12 >decreased synthroid 85mcg daily    . FATIGUE / MALAISE 07/01/2009    Chronic  . Chronic renal insufficiency, stage II (mild) 2015    CrCl in th 60s.  . Chronic pain syndrome  right knee--[name]  . PONV (postoperative nausea and vomiting)     hard to wake up after having anesthesia  . IBS (irritable bowel syndrome)   . Diverticulitis   . IgM deficiency   . OSA (obstructive sleep apnea)     does not have machine; Dr. Halford Chessman  . Family history of adverse reaction to anesthesia     Pts sisters have a diffcult time waking up after anesthesia    Medications:  Prescriptions prior to admission  Medication Sig Dispense Refill Last Dose  . ALPRAZolam (XANAX) 0.5 MG tablet TAKE ONE-HALF TO ONE TABLET BY MOUTH THREE TIMES DAILY AS NEEDED (Patient taking differently:  Take 0.5 mg by mouth 3 (three) times daily as needed for anxiety. ONE TABLET BY MOUTH THREE TIMES DAILY AS NEEDED) 90 tablet 5 01/19/2015 at 2200  . Cholecalciferol (VITAMIN D) 2000 UNITS CAPS Take 5 capsules by mouth daily.    Past Week at Unknown time  . diphenhydrAMINE (BENADRYL) 25 MG tablet Take 25 mg by mouth at bedtime as needed.   Past Week at Unknown time  . fish oil-omega-3 fatty acids 1000 MG capsule Take 2 g by mouth daily.    Past Week at Unknown time  . fosinopril (MONOPRIL) 10 MG tablet Take 1 tablet (10 mg total) by mouth daily. 30 tablet 3 01/19/2015 at Unknown time  . hydrocortisone 2.5 % cream Apply 1 application topically as needed. RECTAL ITCHING   Past Month at Unknown time  . Inositol Niacinate 500 MG CAPS Take 1 capsule by mouth at bedtime.   Past Week at Unknown time  . Levothyroxine Sodium 75 MCG CAPS 1/2 tab po qd 45 capsule 1 01/20/2015 at 0430  . meclizine (ANTIVERT) 25 MG tablet Take 1 tablet (25 mg total) by mouth every 6 (six) hours as needed for dizziness. 30 tablet 3 Past Week at Unknown time  . meloxicam (MOBIC) 7.5 MG tablet Take 2 tablets (15 mg total) by mouth daily. 180 tablet 1 Past Week at Unknown time  . metoprolol tartrate (LOPRESSOR) 25 MG tablet Take 0.5 tablets (12.5 mg total) by mouth every morning. 30 tablet 3 01/20/2015 at 0430  . Multiple Vitamins-Iron (MULTIVITAMIN/IRON PO) Take 1 tablet by mouth daily.    Past Week at Unknown time  . oxyCODONE (OXY IR/ROXICODONE) 5 MG immediate release tablet Take 5 mg by mouth every 4 (four) hours as needed for moderate pain. One tab every four hours   01/20/2015 at 0430  . promethazine (PHENERGAN) 25 MG tablet Take 1 tablet (25 mg total) by mouth every 6 (six) hours as needed for nausea or vomiting. 30 tablet 1 Past Month at Unknown time  . simvastatin (ZOCOR) 40 MG tablet Take 1 tablet (40 mg total) by mouth at bedtime. 30 tablet 3 01/19/2015 at Unknown time  . solifenacin (VESICARE) 5 MG tablet Take 5 mg by mouth daily.    01/20/2015 at 0430  . Azelastine HCl (ASTEPRO) 0.15 % SOLN 2 sprays each nostril every 12 hours (Patient taking differently: Place 2 sprays into the nose 2 (two) times daily as needed (Allergies). 2 sprays each nostril every 12 hours) 30 mL 11 More than a month at Unknown time  . traMADol (ULTRAM) 50 MG tablet Take 50 mg by mouth every 6 (six) hours as needed.   More than a month at Unknown time  . [DISCONTINUED] niacin (NIASPAN) 500 MG CR tablet Take 1 tablet (500 mg total) by mouth at bedtime. (Patient not  taking: Reported on 01/13/2015) 90 tablet 3 Not Taking at Unknown time  . [DISCONTINUED] phenazopyridine (PYRIDIUM) 200 MG tablet Take 1 tablet (200 mg total) by mouth 3 (three) times daily as needed for pain. (Patient not taking: Reported on 01/13/2015) 15 tablet 1 Not Taking at Unknown time   Scheduled:  . [START ON 01/21/2015] darifenacin  7.5 mg Oral Daily  . [START ON 01/21/2015] levothyroxine  37.5 mcg Oral q morning - 10a  . [START ON 01/21/2015] lisinopril  10 mg Oral Daily  . [START ON 01/21/2015] metoprolol tartrate  12.5 mg Oral q morning - 10a  . simvastatin  40 mg Oral QHS  . sodium chloride  3 mL Intravenous Q12H  . vancomycin        Assessment: 69 yo female s/p spinal surgery to begin vancomycin for surgical prophylaxis. Vancomycin 1000mg  IV given at about 7:30am today.  A hemovac drain was left in place. SCr= 0.78 and 4/28 and CrCl ~ 75.   5/2 vanc>>  Goal of Therapy:  Vancomycin trough level 10-15 mcg/ml  Plan:  -Vancomycin 1000mg  IV q12h  -Will  follow plans drain removal and length of therapy -Will consider vancomycin levels and BMET based on length of therapy  Hildred Laser, Pharm D 01/20/2015 1:42 PM

## 2015-01-20 NOTE — Anesthesia Procedure Notes (Signed)
Procedure Name: Intubation Date/Time: 01/20/2015 8:09 AM Performed by: Merdis Delay Pre-anesthesia Checklist: Patient identified, Timeout performed, Patient being monitored, Suction available and Emergency Drugs available Patient Re-evaluated:Patient Re-evaluated prior to inductionOxygen Delivery Method: Circle system utilized Preoxygenation: Pre-oxygenation with 100% oxygen Intubation Type: IV induction Ventilation: Mask ventilation without difficulty Laryngoscope Size: Mac and 3 Grade View: Grade II Tube type: Oral Tube size: 7.0 mm Number of attempts: 1 Airway Equipment and Method: Stylet Placement Confirmation: ETT inserted through vocal cords under direct vision,  breath sounds checked- equal and bilateral,  positive ETCO2 and CO2 detector Secured at: 22 cm Tube secured with: Tape Dental Injury: Teeth and Oropharynx as per pre-operative assessment

## 2015-01-20 NOTE — Anesthesia Postprocedure Evaluation (Signed)
Anesthesia Post Note  Patient: [name]  Procedure(s) Performed: Procedure(s) (LRB): Lumbar two-three Lumbar three-four Posterior lumbar interbody fusion with hardware removal  (N/A)  Anesthesia type: general  Patient location: PACU  Post pain: Pain level controlled  Post assessment: Patient's Cardiovascular Status Stable  Last Vitals:  Filed Vitals:   01/20/15 1146  BP: 128/54  Pulse: 62  Temp: 36.5 C  Resp: 16    Post vital signs: Reviewed and stable  Level of consciousness: sedated  Complications: No apparent anesthesia complications

## 2015-01-20 NOTE — Brief Op Note (Signed)
01/20/2015  10:35 AM  PATIENT:  [name]  69 y.o. female  PRE-OPERATIVE DIAGNOSIS:  Disc Displacement, Lumbar  POST-OPERATIVE DIAGNOSIS:  Disc Displacement, Lumbar   PROCEDURE:  Procedure(s): Lumbar two-three Lumbar three-four Posterior lumbar interbody fusion with hardware removal  (N/A)  SURGEON:  Surgeon(s) and Role:    * Earnie Larsson, MD - Primary    * Jovita Gamma, MD - Assisting  PHYSICIAN ASSISTANT:   ASSISTANTS:    ANESTHESIA:   general  EBL:  Total I/O In: 8590 [I.V.:1100; Blood:90] Out: 515 [Urine:215; Blood:300]  BLOOD ADMINISTERED:none  DRAINS: (Medium) Hemovact drain(s) in the Epidural space with  Suction Open   LOCAL MEDICATIONS USED:  MARCAINE     SPECIMEN:  No Specimen  DISPOSITION OF SPECIMEN:  N/A  COUNTS:  YES  TOURNIQUET:  * No tourniquets in log *  DICTATION: .Dragon Dictation  PLAN OF CARE: Admit to inpatient   PATIENT DISPOSITION:  PACU - hemodynamically stable.   Delay start of Pharmacological VTE agent (>24hrs) due to surgical blood loss or risk of bleeding: yes

## 2015-01-20 NOTE — H&P (Signed)
[name] is an 69 y.o. female.   Chief Complaint: Back and leg pain HPI: 69 year old female status post L4-5 decompression infusion in the past with good results presents with worsening back and bilateral lower extremity symptoms consistent with neurogenic claudication and radiculopathy. Workup demonstrates evidence of advanced adjacent level disease at L 34 with a central disc herniation and severe stenosis. Patient has retrolisthesis of L2 on L3 with marked facet arthropathy. Patient has failed conservative management. She presents now for L2-3 and L3 for decompression infusion in hopes of improving her symptoms.  Past Medical History  Diagnosis Date  . Allergic rhinitis   . HTN (hypertension)   . Chest pain     cr  . Palpitations   . Hypercholesterolemia   . GERD (gastroesophageal reflux disease)   . Diverticulosis of colon   . History of colonic polyps   . DJD (degenerative joint disease)   . Lumbar back pain   . Fibromyalgia   . Osteopenia 04/2014    T score -2.0 X. 11%/1.8%  . Vitamin D deficiency   . Anxiety   . Depression   . Psoriasis   . Unspecified disorder of plasma protein metabolism     IgM kappa MGUS ([name]) routine f/u has shown stability of this problem.  . IC (interstitial cystitis)     [name] at [place]  . Iron deficiency anemia, unspecified 06/27/2013  . Memory impairment 10/03/2012    Memory impairment 10/03/2012 >referral to neuro    . Hypothyroid 07/05/2012    TSH ~5.92 >begin Synthroid 45mcg daily 07/05/2012  TSH 2.42 09/28/12 >decreased synthroid 44mcg daily    . FATIGUE / MALAISE 07/01/2009    Chronic  . Chronic renal insufficiency, stage II (mild) 2015    CrCl in th 60s.  . Chronic pain syndrome     right knee--[name]  . PONV (postoperative nausea and vomiting)     hard to wake up after having anesthesia  . IBS (irritable bowel syndrome)   . Diverticulitis   . IgM deficiency   . OSA (obstructive sleep apnea)      does not have machine; Dr. Halford Chessman  . Family history of adverse reaction to anesthesia     Pts sisters have a diffcult time waking up after anesthesia    Past Surgical History  Procedure Laterality Date  . Laminotomy  2010    L-4, L-5 FUSION  . Knee arthroscopy      RIGHT KNEE X 5  . Total knee arthroplasty  12-07 and 4-07    [name], revision 12-09 [name] at [facility]  . Patella fracture surgery  10-08    [name]  . Posterior laminectomy / decompression lumbar spine  9-09    [name]  . Hand surgery Right     TRIGGER FINGER; index finger  . Abdominal hysterectomy      for prolapse with abdominal incision  . Oophorectomy  2010    Laparoscopic BSO (path benign)  . Esophagogastroduodenoscopy  05/29/2009    Esoph normal.  Gastritis (H pylori NEG) + gastric polyps (benign).  Duodenal biopsy NORMAL.  [name] Colonoscopy  04/2006    Diverticulosis, o/w normal.  . Nasal sinus surgery      polypectomy (remote past)  . Hernia repair      umbilical  . Cholecystectomy    . Cardiac catheterization      no PCI  . Eye surgery Left     "lazy eye"  Family History  Problem Relation Age of Onset  . Hypertension Mother   . Hypertension Father   . Breast cancer Sister     Age 29's  . Hypertension Sister   . Leukemia Brother   . Lung disease Brother   . Cancer Brother     Melanoma  . Coronary artery disease Sister   . Lupus Sister    Social History:  reports that she has never smoked. She has never used smokeless tobacco. She reports that she does not drink alcohol or use illicit drugs.  Allergies:  Allergies  Allergen Reactions  . Cymbalta [Duloxetine Hcl]     Facial edema  . Iohexol      Code: HIVES, Desc: pt states her face and throat swells when administered IV contrast - KB, Onset Date: 70623762   . Mepergan [Meperidine-Promethazine]     Cardiac arrest  . Augmentin [Amoxicillin-Pot Clavulanate] Nausea Only  . Citalopram Other (See Comments)    Jittery/heart racing   .  Hctz [Hydrochlorothiazide] Other (See Comments)    Too much urinating, felt dried out, etc--NO ALLERGY  . Lexapro [Escitalopram Oxalate] Other (See Comments)    Jittery,heart racing  . Lyrica [Pregabalin] Swelling    Face, hands, fingers, ankles  . Valsartan     REACTION: pt states INTOL to Diovan    Medications Prior to Admission  Medication Sig Dispense Refill  . ALPRAZolam (XANAX) 0.5 MG tablet TAKE ONE-HALF TO ONE TABLET BY MOUTH THREE TIMES DAILY AS NEEDED (Patient taking differently: Take 0.5 mg by mouth 3 (three) times daily as needed for anxiety. ONE TABLET BY MOUTH THREE TIMES DAILY AS NEEDED) 90 tablet 5  . Cholecalciferol (VITAMIN D) 2000 UNITS CAPS Take 5 capsules by mouth daily.     . diphenhydrAMINE (BENADRYL) 25 MG tablet Take 25 mg by mouth at bedtime as needed.    . fish oil-omega-3 fatty acids 1000 MG capsule Take 2 g by mouth daily.     . fosinopril (MONOPRIL) 10 MG tablet Take 1 tablet (10 mg total) by mouth daily. 30 tablet 3  . hydrocortisone 2.5 % cream Apply 1 application topically as needed. RECTAL ITCHING    . Inositol Niacinate 500 MG CAPS Take 1 capsule by mouth at bedtime.    . Levothyroxine Sodium 75 MCG CAPS 1/2 tab po qd 45 capsule 1  . meclizine (ANTIVERT) 25 MG tablet Take 1 tablet (25 mg total) by mouth every 6 (six) hours as needed for dizziness. 30 tablet 3  . meloxicam (MOBIC) 7.5 MG tablet Take 2 tablets (15 mg total) by mouth daily. 180 tablet 1  . metoprolol tartrate (LOPRESSOR) 25 MG tablet Take 0.5 tablets (12.5 mg total) by mouth every morning. 30 tablet 3  . Multiple Vitamins-Iron (MULTIVITAMIN/IRON PO) Take 1 tablet by mouth daily.     [name] oxyCODONE (OXY IR/ROXICODONE) 5 MG immediate release tablet Take 5 mg by mouth every 4 (four) hours as needed for moderate pain. One tab every four hours    . promethazine (PHENERGAN) 25 MG tablet Take 1 tablet (25 mg total) by mouth every 6 (six) hours as needed for nausea or vomiting. 30 tablet 1  .  simvastatin (ZOCOR) 40 MG tablet Take 1 tablet (40 mg total) by mouth at bedtime. 30 tablet 3  . solifenacin (VESICARE) 5 MG tablet Take 5 mg by mouth daily.    . Azelastine HCl (ASTEPRO) 0.15 % SOLN 2 sprays each nostril every 12 hours (Patient taking differently: Place 2  sprays into the nose 2 (two) times daily as needed (Allergies). 2 sprays each nostril every 12 hours) 30 mL 11  . niacin (NIASPAN) 500 MG CR tablet Take 1 tablet (500 mg total) by mouth at bedtime. (Patient not taking: Reported on 01/13/2015) 90 tablet 3  . phenazopyridine (PYRIDIUM) 200 MG tablet Take 1 tablet (200 mg total) by mouth 3 (three) times daily as needed for pain. (Patient not taking: Reported on 01/13/2015) 15 tablet 1  . traMADol (ULTRAM) 50 MG tablet Take 50 mg by mouth every 6 (six) hours as needed.      No results found for this or any previous visit (from the past 48 hour(s)). No results found.  Pertinent items are noted in HPI.  Blood pressure 164/80, pulse 59, temperature 97.8 F (36.6 C), temperature source Oral, resp. rate 20, height 5\' 6"  (1.676 m), weight 86.807 kg (191 lb 6 oz), SpO2 98 %.  Awake and alert. Oriented and appropriate. Cranial nerve function intact. Motor and sensory function of the upper extremities normal. Motor and sensory function of the lower extremities reveal some slight weakness of her quadriceps function bilaterally. Otherwise motor strength intact. Sensory examination reveals decreased sensation over light touch in her L3 and L4 dermatomes bilaterally. Deep tendon versus are normal active except her patellar reflexes are diminished and her Achilles reflexes are absent. Wound is well-healed. Examination head ears eyes nose and throat are unremarkable. Neck is supple with full active range of motion. Chest and abdomen are benign. Extremities are free from injury or deformity. Assessment/Plan L2-3 and L3-4 stenosis with severe disc degeneration and resultant neurogenic claudication.  Plan L2-3 and L3-4 decompressive laminectomy with foraminotomies followed by posterior lumbar interbody fusion utilizing interbody peek cages, locally harvested autograft, and supplemented with posterior lateral arthrodesis utilizing segmental pedicle screw fixation. This will require removal of previous L4-5 pedicle screw instrumentation. Risks and benefits of been explained. Patient wishes to proceed.  Ferman Basilio A 01/20/2015, 7:30 AM

## 2015-01-20 NOTE — Op Note (Signed)
Date of procedure: 01/20/2015  Date of dictation: Same  Service: Neurosurgery  Preoperative diagnosis: L2-3, L3-4 degenerative disc disease with stenosis and neurogenic claudication status post previous L4-5 decompression and fusion with instrumentation  Postoperative diagnosis: Same  Procedure Name: L2-3, L3-4 decompressive laminectomies with bilateral L2, L3, L4 decompressive foraminotomies, more than would be required for simple interbody fusion alone.  L2-3, L3-4 posterior lumbar interbody fusion utilizing interbody peek cages and locally harvested autograft.  L2-L4 posterior lateral arthrodesis utilizing segmental pedicle screw fixation and local autografting  Reexploration of L4-5 fusion with removal of hardware   Surgeon:Erubiel Manasco A.Aralyn Nowak, M.D.  Asst. Surgeon: Sherwood Gambler   Anesthesia: General  Indication:69 year old female status post previous L4-5 decompression and fusion with good results presents now with worsening, intractable back and bilateral lower extremity symptoms failing conservative management. Workup demonstrates evidence of severe adjacent level degeneration with a moderately large central disc herniation at L3-4 with severe spinal stenosis and severe facet arthropathy. Patient also with facet arthropathy and retrolisthesis of L2 on L3. Patient presents now for two-level lumbar decompression and fusion in hopes of improving her symptoms   Operative note:After induction of anesthesia, patient position prone onto Wilson frame and appropriately padded. Lumbar region prepped and draped sterilely. Incision made from L2-L5. Dissection performed bilaterally. Retractor placed. Fluoroscopy used. Levels confirmed. Previously placed pedicle screw instrumentation at L4-5 was disassembled and the L5 screws were removed. Fusion at L4-5 was carefully inspected and found to be solid. Pedicle screws at L4 were solid and left in place. Decompressive laminectomies then performed at L2 and L3 by  removing the entire lamina of L2 the entire lamina of L3 the entire inferior facet of L2 bilaterally and inferior facet of L3 bilaterally. Superior facetectomies of L3 and L4 were performed bilaterally as well. All bone was cleaned in use and later autografting. Ligamentum flavum and epidural scar were elevated and resected in piecemeal fashion. Underlying thecal sac and exiting L2, L3, L4 nerve roots were identified. Wide decompressive foraminotomies were performed along the course of all nerve roots. Epidural venous plexus was coagulated and cut. Bilateral discectomies were then performed at L2-3 and L3-4. Disc spaces were distracted and measured for implant placement. With a 12 mm distractor left the patient's left side thecal sac and nerve roots were protected on the right side at L2-3. Disc spaces and reamed and soft tissue was removed from the interspace. Disc spaces further scraped with curettes and further soft tissue was removed. A 12 mm x 22 mm x 6 Medtronic capstone cage packed with morselized locally harvested autograft was then impacted into place and rotated into final position. Distractor was removed patient's left side. Thecal sac and nerve roots were protected on the left side. Disc space was then once again reamed and prepared for interbody fusion. Soft tissue was removed and interspace. Morselize autograft was packed into the interspace. A second 12 x 22 x 6 cage was impacted into place and rotated into final position. Procedure was repeated at L3-4 once again using 12 mm implants. Pedicles of L2 and L3 were identified using surface landmarks and intraoperative fluoroscopy. Superficial bone was removed overlying the pedicles of L2 and L3. Each pedicles and probed using a pedicle awl. Each pedicle awl track was probed and found to be solidly within the bone. Each pedicle awl track was then tapped with a 5.25 mm screw tap. Each screw tap hole was probed and found to be solidly within the bone.  5.75 x 40 mm t [employer]  medical pedicle screws were placed bilaterally at L2 and L3. Transverse processes of L2-L3 and L4 were decorticated. Morselize autograft was packed posterior laterally. Titanium rod was then placed over the screw heads from L2-L4. Locking caps and placed over the screw heads. Locking caps were then engaged with the construct under compression. Transverse connector was placed. Final images revealed good position of the bone graft and hardware at the proper operative level with normal alignment of the spine. Wound is irrigated one final time. Gelfoam was placed topically for hemostasis. Medium Hemovac drain was left in the epidural space. Wounds and close in layers with Vicryl sutures. Vancomycin powder was placed the deep wound space prior to closure area did Steri-Strips and sterile dressing were applied. There were no apparent complications. The patient tolerated the procedure well and she returns to the recovery room postop.

## 2015-01-21 MED FILL — Heparin Sodium (Porcine) Inj 1000 Unit/ML: INTRAMUSCULAR | Qty: 30 | Status: AC

## 2015-01-21 MED FILL — Sodium Chloride IV Soln 0.9%: INTRAVENOUS | Qty: 2000 | Status: AC

## 2015-01-21 NOTE — Evaluation (Addendum)
Occupational Therapy Evaluation Patient Details Name: [name] MRN: [id number] DOB: 05/09/1946 Today's Date: 01/21/2015    History of Present Illness 69 yo patient with complex medical history including R TKA, presents now s/p Lumbar two-three Lumbar three-four Posterior lumbar interbody fusion with hardware removal.    Clinical Impression   Pt s/p above. Pt independent with ADLs, PTA. Feel pt will benefit from acute OT to increase independence prior to d/c. Pt will have spouse to assist her at home. Plan to practice shower transfer next session.    Follow Up Recommendations  No OT follow up;Supervision - Intermittent    Equipment Recommendations  None recommended by OT    Recommendations for Other Services       Precautions / Restrictions Precautions Precautions: Back;Fall Precaution Booklet Issued:  (one in room) Precaution Comments: reviewed precautions; pt able to state 3/3 Required Braces or Orthoses: Spinal Brace Spinal Brace: Lumbar corset;Applied in sitting position Restrictions Weight Bearing Restrictions: No      Mobility Bed Mobility Overal bed mobility: Needs Assistance Bed Mobility: Rolling;Sidelying to Sit;Sit to Sidelying Rolling: Modified independent (Device/Increase time);Supervision Sidelying to sit: Supervision     Sit to sidelying: Modified independent (Device/Increase time) General bed mobility comments: pt dizzy upon sitting up. Cues for technique.  Transfers Overall transfer level: Needs assistance Equipment used: Rolling walker (2 wheeled) Transfers: Sit to/from Stand Sit to Stand: Min guard         General transfer comment: cues for hand placement.    Balance  No LOB in session-balance not formally assesed (supervision for ambulation)                           ADL Overall ADL's : Needs assistance/impaired     Grooming: Wash/dry face;Oral care;Brushing hair;Set up;Supervision/safety;Standing           Upper  Body Dressing : Set up;Supervision/safety;Sitting;Standing Upper Body Dressing Details (indicate cue type and reason): adjusted brace slightly standing Lower Body Dressing: Minimal assistance;Sit to/from stand;With adaptive equipment Lower Body Dressing Details (indicate cue type and reason): pt does not wear socks Toilet Transfer: Supervision/safety;RW;Ambulation (chair/bed)           Functional mobility during ADLs: Supervision/safety;Rolling walker (cues for sidestepping) General ADL Comments: Educated on brace. Discussed incorporating precautions into functional activities. Educated on safety such as safe footwear and sitting for LB bathing and discussed shower transfer technique.  Discussed car transfer technique. Discussed positioning of pillows and not to sit longer than one hour at a time. Encouraged pt to sit up in chair for lunch. Educated on AE and pt practiced.     Vision     Perception     Praxis      Pertinent Vitals/Pain Pain Assessment: 0-10 Pain Score:  (7 at beginning and 5 at end of session) Pain Location: back and left leg Pain Descriptors / Indicators: Burning;Sore Pain Intervention(s): Repositioned;Limited activity within patient's tolerance;Monitored during session;Premedicated before session     Hand Dominance Right   Extremity/Trunk Assessment Upper Extremity Assessment Upper Extremity Assessment: Overall WFL for tasks assessed   Lower Extremity Assessment Lower Extremity Assessment: Defer to PT evaluation       Communication Communication Communication: No difficulties   Cognition Arousal/Alertness: Awake/alert Behavior During Therapy: WFL for tasks assessed/performed Overall Cognitive Status: Within Functional Limits for tasks assessed                     General Comments  Exercises       Shoulder Instructions      Home Living Family/patient expects to be discharged to:: Private residence Living Arrangements:  Spouse/significant other Available Help at Discharge: Family Type of Home: House Home Access: Stairs to enter Technical brewer of Steps: 2 Entrance Stairs-Rails: None Home Layout: One level     Bathroom Shower/Tub: Walk-in shower;Tub only (curtain on walk-in shower)   Bathroom Toilet: Handicapped height     Home Equipment: Walker - 2 wheels;Cane - single point;Wheelchair - [employer];Adaptive equipment Adaptive Equipment: Reacher;Long-handled shoe horn;Other (Comment) (long brush)        Prior Functioning/Environment Level of Independence: Independent             OT Diagnosis: Acute pain   OT Problem List: Decreased knowledge of use of DME or AE;Decreased range of motion;Decreased strength;Decreased activity tolerance;Pain   OT Treatment/Interventions: Self-care/ADL training;DME and/or AE instruction;Balance training;Patient/family education;Therapeutic activities    OT Goals(Current goals can be found in the care plan section) Acute Rehab OT Goals Patient Stated Goal: not stated OT Goal Formulation: With patient Time For Goal Achievement: 01/28/15 Potential to Achieve Goals: Good ADL Goals Pt Will Perform Lower Body Dressing: with set-up;with adaptive equipment;sit to/from stand Pt Will Transfer to Toilet: ambulating;with modified independence (3 in 1 over commode) Pt Will Perform Toileting - Clothing Manipulation and hygiene: with modified independence;sit to/from stand Pt Will Perform Tub/Shower Transfer: Shower transfer;with supervision;ambulating;3 in 1;rolling walker  OT Frequency: Min 2X/week   Barriers to D/C:            Co-evaluation              End of Session Equipment Utilized During Treatment: Gait belt;Back brace;Rolling walker Nurse Communication: Other (comment) (back in bed)  Activity Tolerance: Patient limited by pain Patient left: in bed;with call bell/phone within reach;with family/visitor present   Time:  3818-4037 OT Time Calculation (min): 30 min Charges:  OT General Charges $OT Visit: 1 Procedure OT Evaluation $Initial OT Evaluation Tier I: 1 Procedure OT Treatments $Self Care/Home Management : 8-22 mins G-CodesBenito Mccreedy OTR/L [phone] 01/21/2015, 10:13 AM

## 2015-01-21 NOTE — Progress Notes (Signed)
Postop day 1. Overall doing reasonably well. Patient complains of back pain. Some left anterior thigh discomfort. No complaints of weakness or numbness. Patient ambulating and halls.  Afebrile. Vital signs are stable. Urine output good. Drain output moderate. Awake and alert. Oriented and appropriate. Speech fluent. Motor 5/5 bilaterally. Sensory intact. Abdomen soft. Wound clean and dry.  Progressing reasonably well following 2 level lumbar decompression and fusion. Continue efforts at mobilization and pain control. Possible discharge home tomorrow.

## 2015-01-21 NOTE — Progress Notes (Signed)
Physical Therapy Treatment Patient Details Name: [name] MRN: [id number] DOB: 1946/09/17 Today's Date: 01/21/2015    History of Present Illness 69 yo patient with complex medical history including R TKA, presents now s/p Lumbar two-three Lumbar three-four Posterior lumbar interbody fusion with hardware removal.     PT Comments    Patient demonstrates improvements in mobility. Ambulated well today, continues to have difficulty with transitions. Patient able to tolerate stair negotiation without difficulty. Will continue to see and progress as tolerated.  Follow Up Recommendations  Supervision/Assistance - 24 hour     Equipment Recommendations  None recommended by PT    Recommendations for Other Services       Precautions / Restrictions Precautions Precautions: Back;Fall Precaution Booklet Issued: Yes (comment) Precaution Comments: verbally reviewed with patient Required Braces or Orthoses: Spinal Brace Spinal Brace: Lumbar corset Restrictions Weight Bearing Restrictions: No    Mobility  Bed Mobility Overal bed mobility: Needs Assistance Bed Mobility: Rolling;Sidelying to Sit;Sit to Sidelying Rolling: Min assist Sidelying to sit: Min guard     Sit to sidelying: Min guard General bed mobility comments: VCs for technique and sequencing  Transfers Overall transfer level: Needs assistance Equipment used: Rolling walker (2 wheeled) Transfers: Sit to/from Stand Sit to Stand: Min guard         General transfer comment: VCs for hand placement and safety  Ambulation/Gait Ambulation/Gait assistance: Supervision Ambulation Distance (Feet): 210 Feet Assistive device: Rolling walker (2 wheeled) Gait Pattern/deviations: Step-through pattern;Decreased stride length Gait velocity: decreased Gait velocity interpretation: Below normal speed for age/gender General Gait Details: steady with gait using RW today   Stairs Stairs: Yes Stairs assistance: Min  guard Stair Management: One rail Right;Forwards;Step to pattern Number of Stairs: 4 General stair comments: VCs for sequencing and safety  Wheelchair Mobility    Modified Rankin (Stroke Patients Only)       Balance   Sitting-balance support: Feet supported Sitting balance-Leahy Scale: Fair     Standing balance support: Bilateral upper extremity supported Standing balance-Leahy Scale: Fair                      Cognition Arousal/Alertness: Awake/alert Behavior During Therapy: WFL for tasks assessed/performed Overall Cognitive Status: Within Functional Limits for tasks assessed                      Exercises      General Comments        Pertinent Vitals/Pain Pain Assessment: 0-10 Pain Score: 5  Pain Location: back Pain Descriptors / Indicators: Aching Pain Intervention(s): Monitored during session;Repositioned;Relaxation;Patient requesting pain meds-RN notified    Home Living                      Prior Function            PT Goals (current goals can now be found in the care plan section) Acute Rehab PT Goals Patient Stated Goal: to go home PT Goal Formulation: With patient/family Time For Goal Achievement: 02/03/15 Potential to Achieve Goals: Good Progress towards PT goals: Progressing toward goals    Frequency  Min 5X/week    PT Plan Current plan remains appropriate    Co-evaluation             End of Session Equipment Utilized During Treatment: Gait belt;Back brace Activity Tolerance: Patient limited by pain;Treatment limited secondary to medical complications (Comment) (patient reports dizziness and nausea ) Patient left: in bed;with call  bell/phone within reach;with family/visitor present     Time: 0728-0752 PT Time Calculation (min) (ACUTE ONLY): 24 min  Charges:  $Gait Training: 8-22 mins $Self Care/Home Management: 8-22                    G CodesDuncan Dull 2015/02/04, 8:08 AM Alben Deeds, PT  DPT  [phone]

## 2015-01-22 ENCOUNTER — Encounter (HOSPITAL_COMMUNITY): Payer: Self-pay | Admitting: Neurosurgery

## 2015-01-22 MED ORDER — DIAZEPAM 5 MG PO TABS: 5.0000 mg | ORAL_TABLET | Freq: Four times a day (QID) | ORAL | Status: DC | PRN

## 2015-01-22 MED ORDER — OXYCODONE HCL 5 MG PO TABS: 5.0000 mg | ORAL_TABLET | ORAL | Status: DC | PRN

## 2015-01-22 NOTE — Progress Notes (Signed)
Physical Therapy Treatment Patient Details Name: [name] MRN: [id number] DOB: [birth year] Today's Date: 01/22/2015    History of Present Illness 69 yo patient with complex medical history including R TKA, presents now s/p Lumbar two-three Lumbar three-four Posterior lumbar interbody fusion with hardware removal.     PT Comments    Patient tolerated session well, ambulated and performed simulated car transfer. Patient with good recall of precautions. Worked on technique for bed mobility with and without rail. Anticipate patient will be safe for d/c home with assist.  Follow Up Recommendations  Supervision/Assistance - 24 hour     Equipment Recommendations  None recommended by PT    Recommendations for Other Services       Precautions / Restrictions Precautions Precautions: Back;Fall Precaution Booklet Issued: Yes (comment) Precaution Comments: verbally reviewed with patient Required Braces or Orthoses: Spinal Brace Spinal Brace: Lumbar corset Restrictions Weight Bearing Restrictions: No    Mobility  Bed Mobility Overal bed mobility: Needs Assistance Bed Mobility: Rolling;Sidelying to Sit;Sit to Sidelying Rolling: Min assist Sidelying to sit: Min guard     Sit to sidelying: Min guard General bed mobility comments: VCs for technique and sequencing  Transfers Overall transfer level: Needs assistance Equipment used: Rolling walker (2 wheeled) Transfers: Sit to/from Stand Sit to Stand: Supervision         General transfer comment: VCs for hand placement and safety  Ambulation/Gait Ambulation/Gait assistance: Supervision Ambulation Distance (Feet): 140 Feet Assistive device: Rolling walker (2 wheeled) Gait Pattern/deviations: Step-through pattern;Decreased stride length Gait velocity: decreased   General Gait Details: steady with gait using RW today   Stairs            Wheelchair Mobility    Modified Rankin (Stroke Patients Only)        Balance     Sitting balance-Leahy Scale: Fair       Standing balance-Leahy Scale: Fair                      Cognition Arousal/Alertness: Awake/alert Behavior During Therapy: WFL for tasks assessed/performed Overall Cognitive Status: Within Functional Limits for tasks assessed                      Exercises      General Comments General comments (skin integrity, edema, etc.): educated and performed simulated car transfer with low chair      Pertinent Vitals/Pain Pain Assessment: 0-10 Pain Score: 5  Pain Location: back Pain Descriptors / Indicators: Sore Pain Intervention(s): Monitored during session;Repositioned    Home Living                      Prior Function            PT Goals (current goals can now be found in the care plan section) Acute Rehab PT Goals Patient Stated Goal: to go home PT Goal Formulation: With patient/family Time For Goal Achievement: 02/03/15 Potential to Achieve Goals: Good Progress towards PT goals: Progressing toward goals    Frequency  Min 5X/week    PT Plan Current plan remains appropriate    Co-evaluation             End of Session Equipment Utilized During Treatment: Gait belt;Back brace Activity Tolerance: Patient tolerated treatment well Patient left: in bed;with call bell/phone within reach;with family/visitor present     Time: 0727-0748 PT Time Calculation (min) (ACUTE ONLY): 21 min  Charges:  $Therapeutic Activity: 8-22 mins  G CodesDuncan Dull 02/06/2015, 7:57 AM Alben Deeds, PT DPT  [phone]

## 2015-01-22 NOTE — Progress Notes (Signed)
Pt's hemovac was removed without difficulty. Moderate amount of drainage was present from the removal. Pressure dressing was placed. Will continue to monitor. Holli Humbles, RN

## 2015-01-22 NOTE — Discharge Summary (Signed)
Physician Discharge Summary  Patient ID: [name] MRN: [id number] DOB/AGE: 68/24/1947 68 y.o.  Admit date: 01/20/2015 Discharge date: 01/22/2015  Admission Diagnoses:  Discharge Diagnoses:  Principal Problem:   DDD (degenerative disc disease), lumbar Active Problems:   Spinal stenosis, lumbar region, with neurogenic claudication   Degenerative disc disease, lumbar   Discharged Condition: good  Hospital Course: The patient is admitted to hospital where she underwent an uncomplicated two-level lumbar decompression and fusion. Postoperative she is doing very well. Back and lower extremity pain very much improved. Standing and ambulate without difficulty. Ready for discharge home. Consults:   Significant Diagnostic Studies:   Treatments:   Discharge Exam: Blood pressure 113/58, pulse 89, temperature 99.9 F (37.7 C), temperature source Oral, resp. rate 18, height 5\' 6"  (1.676 m), weight 86.807 kg (191 lb 6 oz), SpO2 93 %. Awake and alert. Oriented and appropriate. Wound clean and dry. Chest and abdomen benign. Motor and sensory function intact.   Disposition: 01-Home or Self Care     Medication List    TAKE these medications        ALPRAZolam 0.5 MG tablet  Commonly known as:  XANAX  TAKE ONE-HALF TO ONE TABLET BY MOUTH THREE TIMES DAILY AS NEEDED     Azelastine HCl 0.15 % Soln  Commonly known as:  ASTEPRO  2 sprays each nostril every 12 hours     diazepam 5 MG tablet  Commonly known as:  VALIUM  Take 1-2 tablets (5-10 mg total) by mouth every 6 (six) hours as needed for muscle spasms.     diphenhydrAMINE 25 MG tablet  Commonly known as:  BENADRYL  Take 25 mg by mouth at bedtime as needed.     fish oil-omega-3 fatty acids 1000 MG capsule  Take 2 g by mouth daily.     fosinopril 10 MG tablet  Commonly known as:  MONOPRIL  Take 1 tablet (10 mg total) by mouth daily.     hydrocortisone 2.5 % cream  Apply 1 application topically as needed. RECTAL ITCHING      Inositol Niacinate 500 MG Caps  Take 1 capsule by mouth at bedtime.     Levothyroxine Sodium 75 MCG Caps  1/2 tab po qd     meclizine 25 MG tablet  Commonly known as:  ANTIVERT  Take 1 tablet (25 mg total) by mouth every 6 (six) hours as needed for dizziness.     meloxicam 7.5 MG tablet  Commonly known as:  MOBIC  Take 2 tablets (15 mg total) by mouth daily.     metoprolol tartrate 25 MG tablet  Commonly known as:  LOPRESSOR  Take 0.5 tablets (12.5 mg total) by mouth every morning.     MULTIVITAMIN/IRON PO  Take 1 tablet by mouth daily.     oxyCODONE 5 MG immediate release tablet  Commonly known as:  Oxy IR/ROXICODONE  Take 1 tablet (5 mg total) by mouth every 4 (four) hours as needed for moderate pain. One tab every four hours     promethazine 25 MG tablet  Commonly known as:  PHENERGAN  Take 1 tablet (25 mg total) by mouth every 6 (six) hours as needed for nausea or vomiting.     simvastatin 40 MG tablet  Commonly known as:  ZOCOR  Take 1 tablet (40 mg total) by mouth at bedtime.     solifenacin 5 MG tablet  Commonly known as:  VESICARE  Take 5 mg by mouth daily.  traMADol 50 MG tablet  Commonly known as:  ULTRAM  Take 50 mg by mouth every 6 (six) hours as needed.     Vitamin D 2000 UNITS Caps  Take 5 capsules by mouth daily.           Follow-up Information    Follow up with Charlie Pitter, MD.   Specialty:  Neurosurgery   Contact information:   [address] [place] [address] [phone]       Signed: Charlie Pitter 01/22/2015, 7:59 AM

## 2015-01-22 NOTE — Progress Notes (Signed)
Pt and husband given D/C instructions with Rx's, verbal understanding was provided. Pt's incision is covered with gauze dressing and has a small amount of drainage from hemovac removal. Pt's IV was removed prior to D/C. Pt D/C'd home via wheelchair @ 1030 per MD order. Pt is stable @ D/C and has no other needs at this time. Holli Humbles, RN

## 2015-01-22 NOTE — Discharge Instructions (Signed)

## 2015-01-22 NOTE — Progress Notes (Signed)
Occupational Therapy Treatment Patient Details Name: [name] MRN: [id number] DOB: 1946/08/15 Today's Date: 01/22/2015    History of present illness 69 yo patient with complex medical history including R TKA, presents now s/p Lumbar two-three Lumbar three-four Posterior lumbar interbody fusion with hardware removal.    OT comments  Pt limited by pain. Education provided to pt and spouse.  Follow Up Recommendations  No OT follow up;Supervision - Intermittent    Equipment Recommendations  Other (comment) (toilet aide)    Recommendations for Other Services      Precautions / Restrictions Precautions Precautions: Back;Fall Precaution Booklet Issued:  (one in room) Precaution Comments: reviewed precautions Required Braces or Orthoses: Spinal Brace Spinal Brace: Lumbar corset;Applied in sitting position Restrictions Weight Bearing Restrictions: No       Mobility Bed Mobility Overal bed mobility: Needs Assistance Bed Mobility: Rolling;Sidelying to Sit Rolling: Supervision Sidelying to sit: Mod assist     Sit to sidelying: Modified independent (Device/Increase time) General bed mobility comments: assist for trunk to come to sitting position and showed spouse how to assist.  Transfers Overall transfer level: Needs assistance Equipment used: Rolling walker (2 wheeled) Transfers: Sit to/from Stand Sit to Stand: Min assist;Supervision         General transfer comment: assist to boost from bed. Pt also performed sit <> stand to/from toilet but without physical assist.    Balance  Supervision for ambulation with RW and short distance without RW.                            ADL Overall ADL's : Needs assistance/impaired     Grooming: Wash/dry hands;Supervision/safety;Standing           Upper Body Dressing : Set up;Supervision/safety;Sitting;Standing       Toilet Transfer: Supervision/safety;Ambulation;Regular Toilet   Toileting- Marine scientist and Hygiene: Sit to/from stand;Minimal assistance (pt able to perform clothing management and hygiene after urination, but simulated reaching behind and pt felt she would not be able to wipe buttocks thoroughly without twisting)       Functional mobility during ADLs: Supervision/safety;Rolling walker General ADL Comments: Educated on shower transfer technique. Educated on safety such as rugs/items on floor and recommended someone be with her for shower transfer, and discussed use of bag/basket on walker. Educated on AE including toilet aide and what pt could use for this at home. Discussed incorporating precautions into functional activities.      Vision                     Perception     Praxis      Cognition  Awake/Alert Behavior During Therapy: WFL for tasks assessed/performed Overall Cognitive Status: Within Functional Limits for tasks assessed                       Extremity/Trunk Assessment               Exercises     Shoulder Instructions       General Comments      Pertinent Vitals/ Pain       Pain Assessment: 0-10 Pain Score: 8  Pain Location: back  Pain Descriptors / Indicators: Sore Pain Intervention(s): Monitored during session;Limited activity within patient's tolerance;Repositioned  Home Living  Prior Functioning/Environment              Frequency Min 2X/week     Progress Toward Goals  OT Goals(current goals can now be found in the care plan section)  Progress towards OT goals: Progressing toward goals  Acute Rehab OT Goals Patient Stated Goal: go home OT Goal Formulation: With patient Time For Goal Achievement: 01/28/15 Potential to Achieve Goals: Good  Plan Discharge plan remains appropriate    Co-evaluation                 End of Session Equipment Utilized During Treatment: Gait belt;Rolling walker;Back brace   Activity Tolerance  Patient limited by pain   Patient Left in bed;with call bell/phone within reach;with family/visitor present   Nurse Communication          Time: 5670-1410 OT Time Calculation (min): 19 min  Charges: OT General Charges $OT Visit: 1 Procedure OT Treatments $Self Care/Home Management : 8-22 mins  Benito Mccreedy OTR/L [phone] 01/22/2015, 10:02 AM

## 2015-01-23 HISTORY — PX: BACK SURGERY: SHX140

## 2015-02-03 ENCOUNTER — Telehealth: Payer: Self-pay | Admitting: Family Medicine

## 2015-02-03 MED ORDER — PROMETHAZINE HCL 25 MG PO TABS: 25.0000 mg | ORAL_TABLET | Freq: Four times a day (QID) | ORAL | Status: DC | PRN

## 2015-02-03 NOTE — Telephone Encounter (Signed)
Pt advised and voiced understanding. Handout put up front for p/u. She wants to know if you could call something in for nausea to [facility].

## 2015-02-03 NOTE — Telephone Encounter (Signed)
I have given her a handout called "modified epley's maneuver" which demonstrates how to do the necessary home treatment for positional vertigo.  If she lost it then she is welcome to come by the office and pick another one up. -thx

## 2015-02-03 NOTE — Telephone Encounter (Signed)
Phenergan tabs sent in today.

## 2015-02-03 NOTE — Telephone Encounter (Signed)
Pt advised and voiced understanding.   

## 2015-02-03 NOTE — Telephone Encounter (Signed)
Spoke to pt and she states that yesterday she started feeling dizzy so she took a meclizine which helped with the dizziness. She stated that this morning she rolled over and the dizziness and nausea came back and now the meclizine is not helping. She wants to know what Dr. [name] recommends. She states that she has a f/u with [name] tomorrow.

## 2015-02-03 NOTE — Telephone Encounter (Signed)
Pt. Had back surgery two weeks ago. Dr. [name] has seen her for vertigo. Yesterday she started getting extremely dizzy and nauseous, she took the meclizine and it is not helping.  Please advise, pt. is very upset.

## 2015-02-07 ENCOUNTER — Encounter (HOSPITAL_COMMUNITY): Payer: Self-pay | Admitting: Neurosurgery

## 2015-02-13 DIAGNOSIS — Z79891 Long term (current) use of opiate analgesic: Secondary | ICD-10-CM | POA: Diagnosis not present

## 2015-02-14 DIAGNOSIS — M961 Postlaminectomy syndrome, not elsewhere classified: Secondary | ICD-10-CM | POA: Diagnosis not present

## 2015-02-14 DIAGNOSIS — G894 Chronic pain syndrome: Secondary | ICD-10-CM | POA: Diagnosis not present

## 2015-02-14 DIAGNOSIS — K5900 Constipation, unspecified: Secondary | ICD-10-CM | POA: Diagnosis not present

## 2015-02-14 DIAGNOSIS — M62830 Muscle spasm of back: Secondary | ICD-10-CM | POA: Diagnosis not present

## 2015-03-04 DIAGNOSIS — Z6830 Body mass index (BMI) 30.0-30.9, adult: Secondary | ICD-10-CM | POA: Diagnosis not present

## 2015-03-04 DIAGNOSIS — M5126 Other intervertebral disc displacement, lumbar region: Secondary | ICD-10-CM | POA: Diagnosis not present

## 2015-03-06 ENCOUNTER — Ambulatory Visit (INDEPENDENT_AMBULATORY_CARE_PROVIDER_SITE_OTHER): Payer: Medicare Other | Admitting: Family Medicine

## 2015-03-06 ENCOUNTER — Encounter: Payer: Self-pay | Admitting: Family Medicine

## 2015-03-06 VITALS — BP 125/82 | HR 67 | Temp 97.7°F | Resp 16 | Wt 187.0 lb

## 2015-03-06 DIAGNOSIS — I10 Essential (primary) hypertension: Secondary | ICD-10-CM | POA: Diagnosis not present

## 2015-03-06 DIAGNOSIS — E7800 Pure hypercholesterolemia, unspecified: Secondary | ICD-10-CM

## 2015-03-06 DIAGNOSIS — R5382 Chronic fatigue, unspecified: Secondary | ICD-10-CM

## 2015-03-06 DIAGNOSIS — E039 Hypothyroidism, unspecified: Secondary | ICD-10-CM

## 2015-03-06 DIAGNOSIS — G9332 Myalgic encephalomyelitis/chronic fatigue syndrome: Secondary | ICD-10-CM

## 2015-03-06 DIAGNOSIS — M797 Fibromyalgia: Secondary | ICD-10-CM

## 2015-03-06 MED ORDER — CITALOPRAM HYDROBROMIDE 20 MG PO TABS: 20.0000 mg | ORAL_TABLET | Freq: Every day | ORAL | Status: DC

## 2015-03-06 NOTE — Progress Notes (Signed)
Pre visit review using our clinic review tool, if applicable. No additional management support is needed unless otherwise documented below in the visit note. 

## 2015-03-06 NOTE — Progress Notes (Signed)
OFFICE NOTE  03/16/2015  CC:  Chief Complaint  Patient presents with  . Follow-up    6 month f/u. Pt is not fasting.    HPI: Patient is a 69 y.o. Caucasian female who is here for 6 mo f/u fibromyalgia syndrome, HTN, hypothyroidism, and hyperlipidemia.  Ruptured 2 discs 09/2014, got fusion surgery 01/2015 by [name]. She is a bit improved but still very much in pain and requiring oxycodone regularly. She recently had f/u with neurosurgeon and she is recovering as expected given the extent of surgery she required. Fibro pain "always" very severe per pt.  Admits to relative noncompliance with meds lately. Wants to put off blood work for a couple months.  She spoke extensively today of her rough marriage and how she is depressed, feels hopeless at times, poor energy, poor concentration and sleep, physical sx's worse than usual lately.  She is isolating herself more lately, has crying spells easily/daily. Denies SI or HI.  Pertinent PMH:  Past medical, surgical, social, and family history reviewed and changes are noted since last office visit.  MEDS:  Outpatient Prescriptions Prior to Visit  Medication Sig Dispense Refill  . ALPRAZolam (XANAX) 0.5 MG tablet TAKE ONE-HALF TO ONE TABLET BY MOUTH THREE TIMES DAILY AS NEEDED (Patient taking differently: Take 0.5 mg by mouth 3 (three) times daily as needed for anxiety. ONE TABLET BY MOUTH THREE TIMES DAILY AS NEEDED) 90 tablet 5  . Azelastine HCl (ASTEPRO) 0.15 % SOLN 2 sprays each nostril every 12 hours (Patient taking differently: Place 2 sprays into the nose 2 (two) times daily as needed (Allergies). 2 sprays each nostril every 12 hours) 30 mL 11  . Cholecalciferol (VITAMIN D) 2000 UNITS CAPS Take 5 capsules by mouth daily.     . diazepam (VALIUM) 5 MG tablet Take 1-2 tablets (5-10 mg total) by mouth every 6 (six) hours as needed for muscle spasms. 60 tablet 0  . diphenhydrAMINE (BENADRYL) 25 MG tablet Take 25 mg by mouth at bedtime as  needed.    . fish oil-omega-3 fatty acids 1000 MG capsule Take 2 g by mouth daily.     . fosinopril (MONOPRIL) 10 MG tablet Take 1 tablet (10 mg total) by mouth daily. 30 tablet 3  . hydrocortisone 2.5 % cream Apply 1 application topically as needed. RECTAL ITCHING    . Levothyroxine Sodium 75 MCG CAPS 1/2 tab po qd 45 capsule 1  . meclizine (ANTIVERT) 25 MG tablet Take 1 tablet (25 mg total) by mouth every 6 (six) hours as needed for dizziness. 30 tablet 3  . meloxicam (MOBIC) 7.5 MG tablet Take 2 tablets (15 mg total) by mouth daily. 180 tablet 1  . metoprolol tartrate (LOPRESSOR) 25 MG tablet Take 0.5 tablets (12.5 mg total) by mouth every morning. 30 tablet 3  . Multiple Vitamins-Iron (MULTIVITAMIN/IRON PO) Take 1 tablet by mouth daily.     [name] oxyCODONE (OXY IR/ROXICODONE) 5 MG immediate release tablet Take 1 tablet (5 mg total) by mouth every 4 (four) hours as needed for moderate pain. One tab every four hours 90 tablet 0  . promethazine (PHENERGAN) 25 MG tablet Take 1 tablet (25 mg total) by mouth every 6 (six) hours as needed for nausea or vomiting. 30 tablet 1  . simvastatin (ZOCOR) 40 MG tablet Take 1 tablet (40 mg total) by mouth at bedtime. 30 tablet 3  . solifenacin (VESICARE) 5 MG tablet Take 5 mg by mouth daily.    . traMADol Veatrice Bourbon)  50 MG tablet Take 50 mg by mouth every 6 (six) hours as needed.    . Inositol Niacinate 500 MG CAPS Take 1 capsule by mouth at bedtime.     No facility-administered medications prior to visit.    PE: Blood pressure 125/82, pulse 67, temperature 97.7 F (36.5 C), temperature source Oral, resp. rate 16, weight 187 lb (84.823 kg), SpO2 95 %. Gen: Alert, well appearing.  Patient is oriented to person, place, time, and situation. Affect: pleasant but gets tearful intermittently.  Lucid thought and speech.  LABS:  Lab Results  Component Value Date   TSH 4.38 09/23/2014   Lab Results  Component Value Date   WBC 9.3 01/16/2015   HGB 14.1 01/16/2015    HCT 42.9 01/16/2015   MCV 88.6 01/16/2015   PLT 172 01/16/2015   Lab Results  Component Value Date   CREATININE 0.79 01/16/2015   BUN 15 01/16/2015   NA 139 01/16/2015   K 3.9 01/16/2015   CL 107 01/16/2015   CO2 22 01/16/2015   Lab Results  Component Value Date   ALT 11 12/17/2014   AST 10 12/17/2014   ALKPHOS 87 12/17/2014   BILITOT 0.7 12/17/2014   Lab Results  Component Value Date   CHOL 209* 09/23/2014   Lab Results  Component Value Date   HDL 36.60* 09/23/2014   Lab Results  Component Value Date   LDLCALC 138* 09/23/2014   Lab Results  Component Value Date   TRIG 172.0* 09/23/2014   Lab Results  Component Value Date   CHOLHDL 6 09/23/2014   IMPRESSION AND PLAN:  1) Major depression, recurrent.  She wants to retry her previous antidepressant which in review of old meds/records appears to be citalopram.  Therapeutic expectations and side effect profile of medication discussed today.  Patient's questions answered.  An After Visit Summary was printed and given to the patient.  Spent 25 min with pt today, with >50% of this time spent in counseling and care coordination regarding the above problems.  FOLLOW UP: 2 mo, for o/v and lab work (TSH, FLP, CMET)

## 2015-03-16 ENCOUNTER — Encounter: Payer: Self-pay | Admitting: Family Medicine

## 2015-03-18 ENCOUNTER — Telehealth: Payer: Self-pay | Admitting: *Deleted

## 2015-03-18 NOTE — Telephone Encounter (Signed)
Pt LMOM stating that the Celexa was making her sick. I spoke to pt and she stated that since she started the Celexa on 03/06/15 she has not felt well, she has had diarrhea, vomiting and muscle jerks/jumping. Spoke with Layne and she recommend that pt stop Celexa and schedule apt with Dr. [name] to discuss this apt made for 03/20/15 at 2:30pm.

## 2015-03-19 MED ORDER — LAMOTRIGINE 25 MG PO TABS: 25.0000 mg | ORAL_TABLET | Freq: Two times a day (BID) | ORAL | Status: DC

## 2015-03-19 NOTE — Telephone Encounter (Signed)
Pt advised and voiced understanding. Rx sent to pharmacy, apt moved to 04/10/15 at 3:15.

## 2015-03-19 NOTE — Telephone Encounter (Signed)
I recommend pt start lamotrigine 25mg  bid as her new med for her depression. Pls eRx lamotrigine 25mg , 1 tab po bid, #60, and I want her to change her appt from 03/20/15 to about 2 weeks from now. Remind her that these types of meds take at least 3 weeks to start helping.  Also, tell her to call or return to office if she starts getting a rash after starting this new med.  Reassure pt-thx.

## 2015-03-20 ENCOUNTER — Ambulatory Visit: Payer: BLUE CROSS/BLUE SHIELD | Admitting: Family Medicine

## 2015-03-25 ENCOUNTER — Other Ambulatory Visit: Payer: Self-pay | Admitting: Family Medicine

## 2015-03-25 NOTE — Telephone Encounter (Signed)
RF request for alprazolam.  LOV: 03/06/15 Next ov: 09/08/15 Last written: 09/02/14 #90 w/ 5RF

## 2015-04-01 ENCOUNTER — Telehealth: Payer: Self-pay | Admitting: Family Medicine

## 2015-04-01 NOTE — Telephone Encounter (Signed)
Noted  

## 2015-04-01 NOTE — Telephone Encounter (Signed)
Patient states the last medication she took for depression made her itch. She said she thinks she is going to have to figure out a way to keep from feeling depressed without medication.

## 2015-04-10 ENCOUNTER — Ambulatory Visit: Payer: BLUE CROSS/BLUE SHIELD | Admitting: Family Medicine

## 2015-04-11 DIAGNOSIS — M62830 Muscle spasm of back: Secondary | ICD-10-CM | POA: Diagnosis not present

## 2015-04-11 DIAGNOSIS — K5900 Constipation, unspecified: Secondary | ICD-10-CM | POA: Diagnosis not present

## 2015-04-11 DIAGNOSIS — M961 Postlaminectomy syndrome, not elsewhere classified: Secondary | ICD-10-CM | POA: Diagnosis not present

## 2015-04-11 DIAGNOSIS — G894 Chronic pain syndrome: Secondary | ICD-10-CM | POA: Diagnosis not present

## 2015-04-21 DIAGNOSIS — M7061 Trochanteric bursitis, right hip: Secondary | ICD-10-CM | POA: Diagnosis not present

## 2015-04-22 DIAGNOSIS — M4806 Spinal stenosis, lumbar region: Secondary | ICD-10-CM | POA: Diagnosis not present

## 2015-04-22 DIAGNOSIS — Z6830 Body mass index (BMI) 30.0-30.9, adult: Secondary | ICD-10-CM | POA: Diagnosis not present

## 2015-04-22 DIAGNOSIS — M5126 Other intervertebral disc displacement, lumbar region: Secondary | ICD-10-CM | POA: Diagnosis not present

## 2015-05-06 ENCOUNTER — Ambulatory Visit: Payer: BLUE CROSS/BLUE SHIELD | Admitting: Family Medicine

## 2015-05-09 ENCOUNTER — Encounter: Payer: Self-pay | Admitting: Gynecology

## 2015-05-09 ENCOUNTER — Ambulatory Visit (INDEPENDENT_AMBULATORY_CARE_PROVIDER_SITE_OTHER): Payer: Medicare Other | Admitting: Gynecology

## 2015-05-09 VITALS — BP 124/86

## 2015-05-09 DIAGNOSIS — L298 Other pruritus: Secondary | ICD-10-CM | POA: Diagnosis not present

## 2015-05-09 DIAGNOSIS — L293 Anogenital pruritus, unspecified: Secondary | ICD-10-CM | POA: Diagnosis not present

## 2015-05-09 DIAGNOSIS — R300 Dysuria: Secondary | ICD-10-CM | POA: Diagnosis not present

## 2015-05-09 DIAGNOSIS — G894 Chronic pain syndrome: Secondary | ICD-10-CM | POA: Diagnosis not present

## 2015-05-09 DIAGNOSIS — K5900 Constipation, unspecified: Secondary | ICD-10-CM | POA: Diagnosis not present

## 2015-05-09 DIAGNOSIS — M62830 Muscle spasm of back: Secondary | ICD-10-CM | POA: Diagnosis not present

## 2015-05-09 DIAGNOSIS — N898 Other specified noninflammatory disorders of vagina: Secondary | ICD-10-CM

## 2015-05-09 DIAGNOSIS — M961 Postlaminectomy syndrome, not elsewhere classified: Secondary | ICD-10-CM | POA: Diagnosis not present

## 2015-05-09 LAB — WET PREP FOR TRICH, YEAST, CLUE
Clue Cells Wet Prep HPF POC: NONE SEEN
TRICH WET PREP: NONE SEEN
YEAST WET PREP: NONE SEEN

## 2015-05-09 LAB — URINALYSIS W MICROSCOPIC + REFLEX CULTURE
BILIRUBIN URINE: NEGATIVE
CASTS: NONE SEEN [LPF]
Crystals: NONE SEEN [HPF]
GLUCOSE, UA: NEGATIVE
KETONES UR: NEGATIVE
Leukocytes, UA: NEGATIVE
NITRITE: NEGATIVE
Protein, ur: NEGATIVE
SPECIFIC GRAVITY, URINE: 1.02 (ref 1.001–1.035)
WBC, UA: NONE SEEN WBC/HPF (ref <=5)
Yeast: NONE SEEN [HPF]
pH: 5.5 (ref 5.0–8.0)

## 2015-05-09 MED ORDER — FLUCONAZOLE 150 MG PO TABS: 150.0000 mg | ORAL_TABLET | Freq: Once | ORAL | Status: DC

## 2015-05-09 NOTE — Patient Instructions (Signed)
Take the one Diflucan pill.  Hold your cholesterol medicine the day you take the pill. Follow up if your itching or discharge continues.

## 2015-05-09 NOTE — Addendum Note (Signed)
Addended by: Nelva Nay on: 05/09/2015 10:17 AM   Modules accepted: Orders

## 2015-05-09 NOTE — Progress Notes (Signed)
[name] September 01, 1946 [id number]        69 y.o.  G0P0 With several days of vaginal discharge with itching. No vaginal odor.  No dysuria, frequency, urgency. No low back pain. No fever or chills.  Past medical history,surgical history, problem list, medications, allergies, family history and social history were all reviewed and documented in the EPIC chart.  Directed ROS with pertinent positives and negatives documented in the history of present illness/assessment and plan.  Exam: Kim assistant Filed Vitals:   05/09/15 0944  BP: 124/86   General appearance:  Normal Abdomen soft nontender without masses guarding rebound Pelvic external BUS vagina with atrophic changes. Scant discharge. Bimanual without masses or tenderness.  Assessment/Plan:  69 y.o. G0P0 with history as above. Wet prep is negative. Urinalysis does show few bacteria. Will check urine culture. Will cover with Diflucan 150 mg 1 dose given her symptoms. Need to hold cholesterol medicine the day of treatment discussed. Follow up if symptoms persist, worsen or recur.    Anastasio Auerbach MD, 10:11 AM 05/09/2015

## 2015-05-10 LAB — URINE CULTURE

## 2015-06-04 ENCOUNTER — Ambulatory Visit (INDEPENDENT_AMBULATORY_CARE_PROVIDER_SITE_OTHER): Payer: Medicare Other | Admitting: Family Medicine

## 2015-06-04 ENCOUNTER — Encounter: Payer: Self-pay | Admitting: Family Medicine

## 2015-06-04 VITALS — BP 127/84 | HR 63 | Temp 98.5°F | Resp 16 | Ht 66.0 in | Wt 184.0 lb

## 2015-06-04 DIAGNOSIS — E039 Hypothyroidism, unspecified: Secondary | ICD-10-CM | POA: Diagnosis not present

## 2015-06-04 DIAGNOSIS — I10 Essential (primary) hypertension: Secondary | ICD-10-CM | POA: Diagnosis not present

## 2015-06-04 DIAGNOSIS — L659 Nonscarring hair loss, unspecified: Secondary | ICD-10-CM

## 2015-06-04 LAB — T4, FREE: FREE T4: 1.04 ng/dL (ref 0.60–1.60)

## 2015-06-04 LAB — T3: T3 TOTAL: 117.5 ng/dL (ref 80.0–204.0)

## 2015-06-04 LAB — COMPREHENSIVE METABOLIC PANEL
ALT: 15 U/L (ref 0–35)
AST: 15 U/L (ref 0–37)
Albumin: 4.1 g/dL (ref 3.5–5.2)
Alkaline Phosphatase: 108 U/L (ref 39–117)
BILIRUBIN TOTAL: 0.4 mg/dL (ref 0.2–1.2)
BUN: 11 mg/dL (ref 6–23)
CHLORIDE: 105 meq/L (ref 96–112)
CO2: 26 mEq/L (ref 19–32)
CREATININE: 0.83 mg/dL (ref 0.40–1.20)
Calcium: 9.2 mg/dL (ref 8.4–10.5)
GFR: 72.55 mL/min (ref >60.00)
GLUCOSE: 117 mg/dL — AB (ref 70–99)
Potassium: 3.8 mEq/L (ref 3.5–5.1)
Sodium: 140 mEq/L (ref 135–145)
Total Protein: 7 g/dL (ref 6.0–8.3)

## 2015-06-04 LAB — SEDIMENTATION RATE: Sed Rate: 49 mm/hr — ABNORMAL HIGH (ref 0–22)

## 2015-06-04 LAB — TSH: TSH: 1.45 u[IU]/mL (ref 0.35–4.50)

## 2015-06-04 NOTE — Progress Notes (Signed)
OFFICE VISIT  06/04/2015   CC:  Chief Complaint  Patient presents with  . Follow-up    Pt is not fasting.   HPI:    Patient is a 70 y.o. Caucasian female who presents for 3 mo f/u HTN, depression, hypothyroidism, depression.  Hair falling out over the last 1 month or so. Says she breaks out in cold clammy sweats when she goes into air conditioning buildings.  She thinks it may be due to her oxycodone.  Goes to pain mgmt again in 2 days, will be discussing possibly going off of her pain meds, although she is still in considerable pain in R knee ("knee replacement gone wrong"--[facility] ortho said no further surgical options available to her).  Home bp monitoring good/normal since last visit.  She was unable to tolerate citalopram ("jerking inside", nervous feeling) or lamictal since I last saw her (itching). Says lexapro caused same feeling as citalopram. She declines offer to see psychiatrist.  She is considering seeing a counselor.  She is not wanting to try any new antidepressants at this time.  Chronic low back pain "still giving me a fit when I walk a lot" but f/u with neurosurgeon each time is reassuring.  Past Medical History  Diagnosis Date  . Allergic rhinitis   . HTN (hypertension)   . Chest pain     cr  . Palpitations   . Hypercholesterolemia   . GERD (gastroesophageal reflux disease)   . Diverticulosis of colon   . History of colonic polyps   . DJD (degenerative joint disease)   . Lumbar back pain   . Fibromyalgia   . Osteopenia 04/2014    T score -2.0 X. 11%/1.8%  . Vitamin D deficiency   . Anxiety   . Major depression, recurrent   . Psoriasis   . Unspecified disorder of plasma protein metabolism     IgM kappa MGUS ([name]) routine f/u has shown stability of this problem.  . IC (interstitial cystitis)     [name] at [facility]  . Iron deficiency anemia, unspecified 06/27/2013  . Memory impairment 10/03/2012    Memory impairment 10/03/2012 >referral to  neuro    . Hypothyroid 07/05/2012  . FATIGUE / MALAISE 07/01/2009    Chronic  . Chronic renal insufficiency, stage II (mild) 2015    CrCl in th 60s.  . Chronic pain syndrome     right knee--[name]  . IBS (irritable bowel syndrome)   . Diverticulitis   . IgM deficiency   . OSA (obstructive sleep apnea)     does not have CPAP; Dr. Halford Chessman    Past Surgical History  Procedure Laterality Date  . Laminotomy  2010    L-4, L-5 FUSION  . Knee arthroscopy      RIGHT KNEE X 5  . Total knee arthroplasty  12-07 and 4-07    [name], revision 12-09 [name] at [facility]  . Patella fracture surgery  10-08    [name]  . Posterior laminectomy / decompression lumbar spine  9-09    [name]  . Hand surgery Right     TRIGGER FINGER; index finger  . Abdominal hysterectomy      for prolapse with abdominal incision  . Oophorectomy  2010    Laparoscopic BSO (path benign)  . Esophagogastroduodenoscopy  05/29/2009    Esoph normal.  Gastritis (H pylori NEG) + gastric polyps (benign).  Duodenal biopsy NORMAL.  [name] Colonoscopy  04/2006  Diverticulosis, o/w normal.  . Nasal sinus surgery      polypectomy (remote past)  . Hernia repair      umbilical  . Cholecystectomy    . Cardiac catheterization      no PCI  . Eye surgery Left     "lazy eye"  . Back surgery  [id number]    L2-3 fusion (Dr. Dutch Quint)    Outpatient Prescriptions Prior to Visit  Medication Sig Dispense Refill  . ALPRAZolam (XANAX) 0.5 MG tablet TAKE ONE-HALF TO ONE TABLET BY MOUTH THREE TIMES DAILY AS NEEDED 90 tablet 5  . Azelastine HCl (ASTEPRO) 0.15 % SOLN 2 sprays each nostril every 12 hours (Patient taking differently: Place 2 sprays into the nose 2 (two) times daily as needed (Allergies). 2 sprays each nostril every 12 hours) 30 mL 11  . Cholecalciferol (VITAMIN D) 2000 UNITS CAPS Take 5 capsules by mouth daily.     . diphenhydrAMINE (BENADRYL) 25 MG tablet Take 25 mg by mouth at bedtime as needed.    . fish  oil-omega-3 fatty acids 1000 MG capsule Take 2 g by mouth daily.     . fosinopril (MONOPRIL) 10 MG tablet Take 1 tablet (10 mg total) by mouth daily. 30 tablet 3  . hydrocortisone 2.5 % cream Apply 1 application topically as needed. RECTAL ITCHING    . Inositol Niacinate 500 MG CAPS Take 1 capsule by mouth at bedtime.    . Levothyroxine Sodium 75 MCG CAPS 1/2 tab po qd 45 capsule 1  . meclizine (ANTIVERT) 25 MG tablet Take 1 tablet (25 mg total) by mouth every 6 (six) hours as needed for dizziness. 30 tablet 3  . meloxicam (MOBIC) 7.5 MG tablet Take 2 tablets (15 mg total) by mouth daily. 180 tablet 1  . metoprolol tartrate (LOPRESSOR) 25 MG tablet Take 0.5 tablets (12.5 mg total) by mouth every morning. 30 tablet 3  . Multiple Vitamins-Iron (MULTIVITAMIN/IRON PO) Take 1 tablet by mouth daily.     [name] oxyCODONE (OXY IR/ROXICODONE) 5 MG immediate release tablet Take 1 tablet (5 mg total) by mouth every 4 (four) hours as needed for moderate pain. One tab every four hours 90 tablet 0  . promethazine (PHENERGAN) 25 MG tablet Take 1 tablet (25 mg total) by mouth every 6 (six) hours as needed for nausea or vomiting. 30 tablet 1  . simvastatin (ZOCOR) 40 MG tablet Take 1 tablet (40 mg total) by mouth at bedtime. 30 tablet 3  . solifenacin (VESICARE) 5 MG tablet Take 5 mg by mouth daily.    . traMADol (ULTRAM) 50 MG tablet Take 50 mg by mouth every 6 (six) hours as needed.    . lamoTRIgine (LAMICTAL) 25 MG tablet Take 1 tablet (25 mg total) by mouth 2 (two) times daily. 60 tablet 0  . diazepam (VALIUM) 5 MG tablet Take 1-2 tablets (5-10 mg total) by mouth every 6 (six) hours as needed for muscle spasms. (Patient not taking: Reported on 06/04/2015) 60 tablet 0  . fluconazole (DIFLUCAN) 150 MG tablet Take 1 tablet (150 mg total) by mouth once. (Patient not taking: Reported on 06/04/2015) 1 tablet 0   No facility-administered medications prior to visit.    Allergies  Allergen Reactions  . Cymbalta  [Duloxetine Hcl]     Facial edema  . Iohexol      Code: HIVES, Desc: pt states her face and throat swells when administered IV contrast - KB, Onset Date: 53010404   . Mepergan [Meperidine-Promethazine]  Cardiac arrest  . Augmentin [Amoxicillin-Pot Clavulanate] Nausea Only  . Citalopram Other (See Comments)    Jittery/heart racing   . Hctz [Hydrochlorothiazide] Other (See Comments)    Too much urinating, felt dried out, etc--NO ALLERGY  . Lexapro [Escitalopram Oxalate] Other (See Comments)    Jittery,heart racing  . Lyrica [Pregabalin] Swelling    Face, hands, fingers, ankles  . Valsartan     REACTION: pt states INTOL to Diovan    ROS As per HPI  PE: Blood pressure 127/84, pulse 63, temperature 98.5 F (36.9 C), temperature source Oral, resp. rate 16, height $RemoveBe'5\' 6"'dTgEpQQSA$  (1.676 m), weight 184 lb (83.462 kg), SpO2 93 %. Gen: Alert, well appearing.  Patient is oriented to person, place, time, and situation. Scalp: diffuse thinning of hair in mid scalp CV: RRR, no m/r/g.   LUNGS: CTA bilat, nonlabored resps, good aeration in all lung fields.   LABS:  Lab Results  Component Value Date   TSH 4.38 09/23/2014     Chemistry      Component Value Date/Time   NA 139 01/16/2015 1610   NA 145 01/15/2010 0835   K 3.9 01/16/2015 1610   K 4.2 01/15/2010 0835   CL 107 01/16/2015 1610   CL 103 01/15/2010 0835   CO2 22 01/16/2015 1610   CO2 30 01/15/2010 0835   BUN 15 01/16/2015 1610   BUN 16 01/15/2010 0835   CREATININE 0.79 01/16/2015 1610   CREATININE 0.9 01/15/2010 0835      Component Value Date/Time   CALCIUM 8.5 01/16/2015 1610   CALCIUM 9.9 01/15/2010 0835   ALKPHOS 87 12/17/2014 0844   ALKPHOS 109* 01/15/2010 0835   AST 10 12/17/2014 0844   AST 18 01/15/2010 0835   ALT 11 12/17/2014 0844   ALT 13 01/15/2010 0835   BILITOT 0.7 12/17/2014 0844   BILITOT 0.60 01/15/2010 0835     Lab Results  Component Value Date   CHOL 209* 09/23/2014   HDL 36.60* 09/23/2014    LDLCALC 138* 09/23/2014   LDLDIRECT 187.8 08/13/2013   TRIG 172.0* 09/23/2014   CHOLHDL 6 09/23/2014   Lab Results  Component Value Date   WBC 9.3 01/16/2015   HGB 14.1 01/16/2015   HCT 42.9 01/16/2015   MCV 88.6 01/16/2015   PLT 172 01/16/2015   IMPRESSION AND PLAN:  1) Hair loss; possibly from uncontrolled hypothyroidism. Check TSH, T4 and T3 today. Will also check ESR today.  2) Hypothyroidism: see #1 above. No dose change at this time.  3) HTN: The current medical regimen is effective;  continue present plan and medications. Lytes/cr today.  4) Chronic dysthymia: she cried in office today again.  Is frustrated with lack of ability to feel happiness.  She declines further trial of antidepressant at this time.  She is considering calling a counselor that a friend recommended to her but admits she doesn't think talking about things will help her.  Gave emotional support to pt today, encouraged her to call if she changes her mind about med trial.  5) Chronic pain: R knee--continue as per pain mgmt MD.  Fibromyalgia pain stable, episodic LBP stable and gradually improving since surgery 01/2015.  6) Hyperlipidemia: tolerating statin, needs repeat FLP at next f/u 08/2015. ALT/AST today.  An After Visit Summary was printed and given to the patient.  FOLLOW UP: Return for keep appt scheduled for 08/2015.

## 2015-06-04 NOTE — Progress Notes (Signed)
Pre visit review using our clinic review tool, if applicable. No additional management support is needed unless otherwise documented below in the visit note. 

## 2015-06-06 DIAGNOSIS — K5900 Constipation, unspecified: Secondary | ICD-10-CM | POA: Diagnosis not present

## 2015-06-06 DIAGNOSIS — G894 Chronic pain syndrome: Secondary | ICD-10-CM | POA: Diagnosis not present

## 2015-06-06 DIAGNOSIS — M961 Postlaminectomy syndrome, not elsewhere classified: Secondary | ICD-10-CM | POA: Diagnosis not present

## 2015-06-06 DIAGNOSIS — Z79891 Long term (current) use of opiate analgesic: Secondary | ICD-10-CM | POA: Diagnosis not present

## 2015-06-06 DIAGNOSIS — M62830 Muscle spasm of back: Secondary | ICD-10-CM | POA: Diagnosis not present

## 2015-06-11 ENCOUNTER — Other Ambulatory Visit: Payer: Self-pay | Admitting: Family Medicine

## 2015-06-11 NOTE — Telephone Encounter (Signed)
RF request for levothyroxine LOV: 06/04/15 Next ov: 09/08/15 Last written: 01/16/15 #45 w/ 1RF 06/06/15 TSH 1.45

## 2015-06-19 ENCOUNTER — Ambulatory Visit (HOSPITAL_BASED_OUTPATIENT_CLINIC_OR_DEPARTMENT_OTHER): Payer: Medicare Other

## 2015-06-19 ENCOUNTER — Ambulatory Visit (HOSPITAL_BASED_OUTPATIENT_CLINIC_OR_DEPARTMENT_OTHER): Payer: Medicare Other | Admitting: Hematology & Oncology

## 2015-06-19 ENCOUNTER — Encounter: Payer: Self-pay | Admitting: Hematology & Oncology

## 2015-06-19 VITALS — BP 127/66 | HR 69 | Temp 97.6°F | Resp 18 | Ht 66.0 in | Wt 189.0 lb

## 2015-06-19 DIAGNOSIS — D509 Iron deficiency anemia, unspecified: Secondary | ICD-10-CM

## 2015-06-19 DIAGNOSIS — E559 Vitamin D deficiency, unspecified: Secondary | ICD-10-CM

## 2015-06-19 DIAGNOSIS — D472 Monoclonal gammopathy: Secondary | ICD-10-CM

## 2015-06-19 LAB — CBC WITH DIFFERENTIAL (CANCER CENTER ONLY)
BASO#: 0 10*3/uL (ref 0.0–0.2)
BASO%: 0.5 % (ref 0.0–2.0)
EOS ABS: 0.5 10*3/uL (ref 0.0–0.5)
EOS%: 7.4 % — ABNORMAL HIGH (ref 0.0–7.0)
HEMATOCRIT: 38.7 % (ref 34.8–46.6)
HEMOGLOBIN: 12.9 g/dL (ref 11.6–15.9)
LYMPH#: 2.7 10*3/uL (ref 0.9–3.3)
LYMPH%: 40.2 % (ref 14.0–48.0)
MCH: 29 pg (ref 26.0–34.0)
MCHC: 33.3 g/dL (ref 32.0–36.0)
MCV: 87 fL (ref 81–101)
MONO#: 0.3 10*3/uL (ref 0.1–0.9)
MONO%: 4.1 % (ref 0.0–13.0)
NEUT%: 47.8 % (ref 39.6–80.0)
NEUTROS ABS: 3.2 10*3/uL (ref 1.5–6.5)
Platelets: 211 10*3/uL (ref 145–400)
RBC: 4.45 10*6/uL (ref 3.70–5.32)
RDW: 13.9 % (ref 11.1–15.7)
WBC: 6.6 10*3/uL (ref 3.9–10.0)

## 2015-06-19 LAB — COMPREHENSIVE METABOLIC PANEL (CC13)
ALT: 20 U/L (ref 0–55)
AST: 18 U/L (ref 5–34)
Albumin: 3.6 g/dL (ref 3.5–5.0)
Alkaline Phosphatase: 105 U/L (ref 40–150)
Anion Gap: 9 mEq/L (ref 3–11)
BILIRUBIN TOTAL: 0.72 mg/dL (ref 0.20–1.20)
BUN: 12.3 mg/dL (ref 7.0–26.0)
CO2: 23 mEq/L (ref 22–29)
Calcium: 9.2 mg/dL (ref 8.4–10.4)
Chloride: 110 mEq/L — ABNORMAL HIGH (ref 98–109)
Creatinine: 0.9 mg/dL (ref 0.6–1.1)
EGFR: 69 mL/min/{1.73_m2} — AB (ref >90)
Glucose: 113 mg/dl (ref 70–140)
Potassium: 3.7 mEq/L (ref 3.5–5.1)
SODIUM: 142 meq/L (ref 136–145)
TOTAL PROTEIN: 7.1 g/dL (ref 6.4–8.3)

## 2015-06-19 LAB — FERRITIN CHCC: Ferritin: 145 ng/ml (ref 9–269)

## 2015-06-19 LAB — IRON AND TIBC CHCC
%SAT: 32 % (ref 21–57)
Iron: 93 ug/dL (ref 41–142)
TIBC: 291 ug/dL (ref 236–444)
UIBC: 198 ug/dL (ref 120–384)

## 2015-06-19 NOTE — Progress Notes (Signed)
Hematology and Oncology Follow Up Visit  [name] [id number] Aug 16, 1946 69 y.o. 06/19/2015   Principle Diagnosis:  1. IgM kappa monoclonal gammopathy of undetermined significance. 2. Intermittent iron deficiency anemia.  Current Therapy:    IV iron as indicated     Interim History:  Ms.  [name] is back for follow-up. We last saw her back in March. We saw her, the monoclonal spike was 0.4 g/dL. Her IgG level was 689 mg/dL. Her Kappa Lightchain was 2.13 mg/dL.  Her iron studies back in March showed a ferritin of 332 with a iron saturation of 27%.  Her last iron was given back in November 2015.  Her husband is still having issues. He has Graves' disease. He panic got some radiation treatments for ocular issues. I'm sure that his ocular muscles were trapped by increased lymphoid tissue from the Graves' disease. She was wondering if he can get cancer from the radiation. I told her that I do not think so because dose of radiation really was not all that high.  She still is bothered by her left knee. She gets around with a cane.  Her back still bothers her. She has had back surgery.   Overall, her performance status is ECOG 1.  Medications:  Current outpatient prescriptions:  .  ALPRAZolam (XANAX) 0.5 MG tablet, TAKE ONE-HALF TO ONE TABLET BY MOUTH THREE TIMES DAILY AS NEEDED, Disp: 90 tablet, Rfl: 5 .  Azelastine HCl (ASTEPRO) 0.15 % SOLN, 2 sprays each nostril every 12 hours (Patient taking differently: Place 2 sprays into the nose 2 (two) times daily as needed (Allergies). 2 sprays each nostril every 12 hours), Disp: 30 mL, Rfl: 11 .  Cholecalciferol (VITAMIN D) 2000 UNITS CAPS, Take 5 capsules by mouth daily. , Disp: , Rfl:  .  cyclobenzaprine (FLEXERIL) 5 MG tablet, , Disp: , Rfl:  .  diphenhydrAMINE (BENADRYL) 25 MG tablet, Take 25 mg by mouth at bedtime as needed., Disp: , Rfl:  .  fish oil-omega-3 fatty acids 1000 MG capsule, Take 2 g by mouth daily. , Disp: , Rfl:  .   fosinopril (MONOPRIL) 10 MG tablet, Take 1 tablet (10 mg total) by mouth daily., Disp: 30 tablet, Rfl: 3 .  hydrocortisone 2.5 % cream, Apply 1 application topically as needed. RECTAL ITCHING, Disp: , Rfl:  .  Inositol Niacinate 500 MG CAPS, Take 1 capsule by mouth at bedtime., Disp: , Rfl:  .  levothyroxine (SYNTHROID, LEVOTHROID) 75 MCG tablet, TAKE ONE-HALF TABLET BY MOUTH ONCE DAILY, Disp: 45 tablet, Rfl: 11 .  meclizine (ANTIVERT) 25 MG tablet, Take 1 tablet (25 mg total) by mouth every 6 (six) hours as needed for dizziness., Disp: 30 tablet, Rfl: 3 .  meloxicam (MOBIC) 7.5 MG tablet, Take 2 tablets (15 mg total) by mouth daily., Disp: 180 tablet, Rfl: 1 .  metoprolol tartrate (LOPRESSOR) 25 MG tablet, Take 0.5 tablets (12.5 mg total) by mouth every morning., Disp: 30 tablet, Rfl: 3 .  Multiple Vitamins-Iron (MULTIVITAMIN/IRON PO), Take 1 tablet by mouth daily. , Disp: , Rfl:  .  oxyCODONE (OXY IR/ROXICODONE) 5 MG immediate release tablet, Take 1 tablet (5 mg total) by mouth every 4 (four) hours as needed for moderate pain. One tab every four hours, Disp: 90 tablet, Rfl: 0 .  promethazine (PHENERGAN) 25 MG tablet, Take 1 tablet (25 mg total) by mouth every 6 (six) hours as needed for nausea or vomiting., Disp: 30 tablet, Rfl: 1 .  simvastatin (ZOCOR) 40 MG tablet,  Take 1 tablet (40 mg total) by mouth at bedtime., Disp: 30 tablet, Rfl: 3 .  solifenacin (VESICARE) 5 MG tablet, Take 5 mg by mouth daily., Disp: , Rfl:  .  traMADol (ULTRAM) 50 MG tablet, Take 50 mg by mouth every 6 (six) hours as needed., Disp: , Rfl:  .  traZODone (DESYREL) 50 MG tablet, , Disp: , Rfl:   Allergies:  Allergies  Allergen Reactions  . Cymbalta [Duloxetine Hcl]     Facial edema  . Iohexol      Code: HIVES, Desc: pt states her face and throat swells when administered IV contrast - KB, Onset Date: 23300762   . Mepergan [Meperidine-Promethazine]     Cardiac arrest  . Augmentin [Amoxicillin-Pot Clavulanate] Nausea  Only  . Citalopram Other (See Comments)    Jittery/heart racing   . Hctz [Hydrochlorothiazide] Other (See Comments)    Too much urinating, felt dried out, etc--NO ALLERGY  . Lexapro [Escitalopram Oxalate] Other (See Comments)    Jittery,heart racing  . Lyrica [Pregabalin] Swelling    Face, hands, fingers, ankles  . Valsartan     REACTION: pt states INTOL to Diovan    Past Medical History, Surgical history, Social history, and Family History were reviewed and updated.  Review of Systems: As above  Physical Exam:  height is 5\' 6"  (1.676 m) and weight is 189 lb (85.73 kg). Her oral temperature is 97.6 F (36.4 C). Her blood pressure is 127/66 and her pulse is 69. Her respiration is 18.   Well-developed and well-nourished white female. She has no ocular or oral lesions. There are no palpable cervical or supraclavicular lymph nodes. Lungs are clear. Cardiac exam regular rate and rhythm with no murmurs, rubs or bruits. Abdomen is soft. She has good bowel sounds. There is no fluid wave. There is no palpable liver or spleen tip. Back exam shows no tenderness over the spine, ribs or hips. She does have a laminectomy scar in the lumbar spine. Extremities shows no clubbing, cyanosis or edema. She has good range motion of her joints. She has good strength. Skin exam shows no rashes, ecchymoses or petechia. Neurological exam is nonfocal.  Lab Results  Component Value Date   WBC 6.6 06/19/2015   HGB 12.9 06/19/2015   HCT 38.7 06/19/2015   MCV 87 06/19/2015   PLT 211 06/19/2015     Chemistry      Component Value Date/Time   NA 140 06/04/2015 1339   NA 145 01/15/2010 0835   K 3.8 06/04/2015 1339   K 4.2 01/15/2010 0835   CL 105 06/04/2015 1339   CL 103 01/15/2010 0835   CO2 26 06/04/2015 1339   CO2 30 01/15/2010 0835   BUN 11 06/04/2015 1339   BUN 16 01/15/2010 0835   CREATININE 0.83 06/04/2015 1339   CREATININE 0.9 01/15/2010 0835      Component Value Date/Time   CALCIUM 9.2  06/04/2015 1339   CALCIUM 9.9 01/15/2010 0835   ALKPHOS 108 06/04/2015 1339   ALKPHOS 109* 01/15/2010 0835   AST 15 06/04/2015 1339   AST 18 01/15/2010 0835   ALT 15 06/04/2015 1339   ALT 13 01/15/2010 0835   BILITOT 0.4 06/04/2015 1339   BILITOT 0.60 01/15/2010 0835      .   Impression and Plan: Ms. Consoli is 69 year old white female. [place] from my point of view, everything looks pretty good. We will see what her monoclonal studies look like. Her last monoclonal spike was up a  little bit.  As all his, we had a good fellowship. We had good prayer. She is struggling a little bit with her faith. Hopefully, she will be able to get through the issues that she is dealing with.  I want to see her back in 6 months.   [name], MD 9/29/201611:58 AM

## 2015-06-20 ENCOUNTER — Telehealth: Payer: Self-pay | Admitting: *Deleted

## 2015-06-20 NOTE — Telephone Encounter (Addendum)
Patient aware of results  ----- Message from [name], MD sent at 06/19/2015  6:13 PM EDT ----- Call and let her know that he iron level is okay. Thank you

## 2015-06-23 ENCOUNTER — Telehealth: Payer: Self-pay | Admitting: *Deleted

## 2015-06-23 LAB — PROTEIN ELECTROPHORESIS, SERUM, WITH REFLEX
ABNORMAL PROTEIN BAND2: 0.4 g/dL
ABNORMAL PROTEIN BAND3: NOT DETECTED g/dL
ALBUMIN ELP: 3.9 g/dL (ref 3.8–4.8)
Abnormal Protein Band1: 0.2 g/dL
Alpha-1-Globulin: 0.3 g/dL (ref 0.2–0.3)
Alpha-2-Globulin: 0.9 g/dL (ref 0.5–0.9)
Beta 2: 0.5 g/dL (ref 0.2–0.5)
Beta Globulin: 0.4 g/dL (ref 0.4–0.6)
Gamma Globulin: 0.9 g/dL (ref 0.8–1.7)
TOTAL PROTEIN, SERUM ELECTROPHOR: 6.9 g/dL (ref 6.1–8.1)

## 2015-06-23 LAB — IGG, IGA, IGM
IgA: 262 mg/dL (ref 69–380)
IgG (Immunoglobin G), Serum: 675 mg/dL — ABNORMAL LOW (ref 690–1700)
IgM, Serum: 514 mg/dL — ABNORMAL HIGH (ref 52–322)

## 2015-06-23 LAB — IFE INTERPRETATION

## 2015-06-23 LAB — KAPPA/LAMBDA LIGHT CHAINS
KAPPA LAMBDA RATIO: 1.12 (ref 0.26–1.65)
Kappa free light chain: 2.17 mg/dL — ABNORMAL HIGH (ref 0.33–1.94)
LAMBDA FREE LGHT CHN: 1.94 mg/dL (ref 0.57–2.63)

## 2015-06-23 NOTE — Telephone Encounter (Signed)
-----   Message from [name], MD sent at 06/23/2015  1:38 PM EDT ----- Call - No increase in abnormal protein!!!  Everything looks ok!!!  [name]

## 2015-06-24 ENCOUNTER — Telehealth: Payer: Self-pay | Admitting: *Deleted

## 2015-06-24 NOTE — Telephone Encounter (Addendum)
Patient is aware of results.  ----- Message from [name], MD sent at 06/23/2015  1:38 PM EDT ----- Call - No increase in abnormal protein!!!  Everything looks ok!!!  [name]

## 2015-06-26 DIAGNOSIS — L218 Other seborrheic dermatitis: Secondary | ICD-10-CM | POA: Diagnosis not present

## 2015-06-26 DIAGNOSIS — L853 Xerosis cutis: Secondary | ICD-10-CM | POA: Diagnosis not present

## 2015-06-26 DIAGNOSIS — L650 Telogen effluvium: Secondary | ICD-10-CM | POA: Diagnosis not present

## 2015-07-02 DIAGNOSIS — M4806 Spinal stenosis, lumbar region: Secondary | ICD-10-CM | POA: Diagnosis not present

## 2015-07-02 DIAGNOSIS — Z6830 Body mass index (BMI) 30.0-30.9, adult: Secondary | ICD-10-CM | POA: Diagnosis not present

## 2015-07-02 DIAGNOSIS — I10 Essential (primary) hypertension: Secondary | ICD-10-CM | POA: Diagnosis not present

## 2015-07-10 ENCOUNTER — Encounter: Payer: Self-pay | Admitting: Gastroenterology

## 2015-07-10 ENCOUNTER — Encounter: Payer: Self-pay | Admitting: Internal Medicine

## 2015-07-11 ENCOUNTER — Other Ambulatory Visit: Payer: Self-pay | Admitting: Family Medicine

## 2015-07-11 DIAGNOSIS — M961 Postlaminectomy syndrome, not elsewhere classified: Secondary | ICD-10-CM | POA: Diagnosis not present

## 2015-07-11 DIAGNOSIS — M62830 Muscle spasm of back: Secondary | ICD-10-CM | POA: Diagnosis not present

## 2015-07-11 DIAGNOSIS — K5900 Constipation, unspecified: Secondary | ICD-10-CM | POA: Diagnosis not present

## 2015-07-11 DIAGNOSIS — G894 Chronic pain syndrome: Secondary | ICD-10-CM | POA: Diagnosis not present

## 2015-07-14 DIAGNOSIS — M545 Low back pain: Secondary | ICD-10-CM | POA: Diagnosis not present

## 2015-07-14 DIAGNOSIS — M4806 Spinal stenosis, lumbar region: Secondary | ICD-10-CM | POA: Diagnosis not present

## 2015-07-16 DIAGNOSIS — M4806 Spinal stenosis, lumbar region: Secondary | ICD-10-CM | POA: Diagnosis not present

## 2015-07-16 DIAGNOSIS — M545 Low back pain: Secondary | ICD-10-CM | POA: Diagnosis not present

## 2015-07-22 DIAGNOSIS — M4806 Spinal stenosis, lumbar region: Secondary | ICD-10-CM | POA: Diagnosis not present

## 2015-07-22 DIAGNOSIS — M545 Low back pain: Secondary | ICD-10-CM | POA: Diagnosis not present

## 2015-07-22 DIAGNOSIS — B3784 Candidal otitis externa: Secondary | ICD-10-CM

## 2015-07-22 HISTORY — DX: Candidal otitis externa: B37.84

## 2015-07-25 DIAGNOSIS — M4806 Spinal stenosis, lumbar region: Secondary | ICD-10-CM | POA: Diagnosis not present

## 2015-07-25 DIAGNOSIS — M545 Low back pain: Secondary | ICD-10-CM | POA: Diagnosis not present

## 2015-07-29 DIAGNOSIS — M4806 Spinal stenosis, lumbar region: Secondary | ICD-10-CM | POA: Diagnosis not present

## 2015-07-29 DIAGNOSIS — M545 Low back pain: Secondary | ICD-10-CM | POA: Diagnosis not present

## 2015-07-31 ENCOUNTER — Encounter: Payer: Self-pay | Admitting: Family Medicine

## 2015-07-31 ENCOUNTER — Ambulatory Visit (INDEPENDENT_AMBULATORY_CARE_PROVIDER_SITE_OTHER): Payer: Medicare Other | Admitting: Family Medicine

## 2015-07-31 VITALS — BP 123/82 | HR 75 | Temp 98.0°F | Resp 16 | Ht 66.0 in | Wt 193.0 lb

## 2015-07-31 DIAGNOSIS — J069 Acute upper respiratory infection, unspecified: Secondary | ICD-10-CM | POA: Diagnosis not present

## 2015-07-31 DIAGNOSIS — F329 Major depressive disorder, single episode, unspecified: Secondary | ICD-10-CM

## 2015-07-31 DIAGNOSIS — H66011 Acute suppurative otitis media with spontaneous rupture of ear drum, right ear: Secondary | ICD-10-CM | POA: Diagnosis not present

## 2015-07-31 DIAGNOSIS — F32A Depression, unspecified: Secondary | ICD-10-CM

## 2015-07-31 DIAGNOSIS — I10 Essential (primary) hypertension: Secondary | ICD-10-CM

## 2015-07-31 MED ORDER — METOPROLOL TARTRATE 25 MG PO TABS: 25.0000 mg | ORAL_TABLET | Freq: Every day | ORAL | Status: DC

## 2015-07-31 MED ORDER — CEFDINIR 300 MG PO CAPS: 300.0000 mg | ORAL_CAPSULE | Freq: Two times a day (BID) | ORAL | Status: DC

## 2015-07-31 MED ORDER — OFLOXACIN 0.3 % OT SOLN: 10.0000 [drp] | Freq: Every day | OTIC | Status: DC

## 2015-07-31 MED ORDER — CITALOPRAM HYDROBROMIDE 10 MG PO TABS: 10.0000 mg | ORAL_TABLET | Freq: Every day | ORAL | Status: DC

## 2015-07-31 NOTE — Progress Notes (Signed)
Pre visit review using our clinic review tool, if applicable. No additional management support is needed unless otherwise documented below in the visit note. 

## 2015-07-31 NOTE — Progress Notes (Signed)
OFFICE VISIT  07/31/2015   CC:  Chief Complaint  Patient presents with  . Ear Pain    right ear x 2 days, seen at [facility] this morning   HPI:    Patient is a 69 y.o. Caucasian female who presents for right ear pain, onset a couple days ago. Has retro-orbital HA, some hoarseness of voice, some nasal congestion--over the last few days as well.  Took some mucinex. Has not been coughing.  Felt R ear pop a couple of mornings ago, then felt wet drainage in ear at that time.  R ear still hurting quite a bit. Her left ear just started aching some today.  She also says she has decided to initiate seeing a psychiatrist, someone on [facility] in [place] but she could only remember the first name--Julie. Until that time, though, patient asks if she can restart citalopram at very low dose since she feels like she didn't tolerate it well for some reason in the past.  Also, says she is now taking a whole 25mg  lopressor pill daily and needs new rx sent in to reflect this dosing.  Says bp at home lately normal.  Past Medical History  Diagnosis Date  . Allergic rhinitis   . HTN (hypertension)   . Chest pain     cr  . Palpitations   . Hypercholesterolemia   . GERD (gastroesophageal reflux disease)   . Diverticulosis of colon     with hx of 'itis  . History of colonic polyps   . DJD (degenerative joint disease)     L spine and knees  . Lumbar back pain   . Fibromyalgia   . Osteopenia 04/2014    T score -2.0 X. 11%/1.8%  . Vitamin D deficiency   . Anxiety   . Major depression, recurrent ([place])     vs dysthymia,chronic  . Psoriasis   . Monoclonal gammopathy of undetermined significance     IgM kappa MGUS ([name]) routine f/u has shown stability of this problem.  . IC (interstitial cystitis)     [name] at [place]  . Iron deficiency anemia, unspecified 06/27/2013  . Memory impairment 10/03/2012    Memory impairment 10/03/2012 >referral to neuro    . Hypothyroid 07/05/2012  . FATIGUE  / MALAISE 07/01/2009    Chronic  . Chronic renal insufficiency, stage II (mild) 2015    CrCl in th 60s.  . Chronic pain syndrome     right knee osteo, hx of TKA in R knee.  Pt was told by [name] that no further surgical options are available for her--[name]  . IBS (irritable bowel syndrome)   . OSA (obstructive sleep apnea)     does not have CPAP; Dr. Halford Chessman    Past Surgical History  Procedure Laterality Date  . Laminotomy  2010    L-4, L-5 FUSION  . Knee arthroscopy      RIGHT KNEE X 5  . Total knee arthroplasty  12-07 and 4-07    [name], revision 12-09 [name] at [facility]  . Patella fracture surgery  10-08    [name]  . Posterior laminectomy / decompression lumbar spine  9-09    [name]  . Hand surgery Right     TRIGGER FINGER; index finger  . Abdominal hysterectomy      for prolapse with abdominal incision  . Oophorectomy  2010    Laparoscopic BSO (path benign)  . Esophagogastroduodenoscopy  05/29/2009  Esoph normal.  Gastritis (H pylori NEG) + gastric polyps (benign).  Duodenal biopsy NORMAL.  [name] Colonoscopy  04/2006    Diverticulosis, o/w normal.  . Nasal sinus surgery      polypectomy (remote past)  . Hernia repair      umbilical  . Cholecystectomy    . Cardiac catheterization      no PCI  . Eye surgery Left     "lazy eye"  . Back surgery  [id number]    L2-3 fusion ([name])    Outpatient Prescriptions Prior to Visit  Medication Sig Dispense Refill  . ALPRAZolam (XANAX) 0.5 MG tablet TAKE ONE-HALF TO ONE TABLET BY MOUTH THREE TIMES DAILY AS NEEDED 90 tablet 5  . Azelastine HCl (ASTEPRO) 0.15 % SOLN 2 sprays each nostril every 12 hours (Patient taking differently: Place 2 sprays into the nose 2 (two) times daily as needed (Allergies). 2 sprays each nostril every 12 hours) 30 mL 11  . Cholecalciferol (VITAMIN D) 2000 UNITS CAPS Take 5 capsules by mouth daily.     . cyclobenzaprine (FLEXERIL) 5 MG tablet     . diphenhydrAMINE (BENADRYL) 25  MG tablet Take 25 mg by mouth at bedtime as needed.    . fish oil-omega-3 fatty acids 1000 MG capsule Take 2 g by mouth daily.     . fosinopril (MONOPRIL) 10 MG tablet Take 1 tablet (10 mg total) by mouth daily. 30 tablet 3  . hydrocortisone 2.5 % cream Apply 1 application topically as needed. RECTAL ITCHING    . Inositol Niacinate 500 MG CAPS Take 1 capsule by mouth at bedtime.    [name] levothyroxine (SYNTHROID, LEVOTHROID) 75 MCG tablet TAKE ONE-HALF TABLET BY MOUTH ONCE DAILY 45 tablet 11  . meclizine (ANTIVERT) 25 MG tablet Take 1 tablet (25 mg total) by mouth every 6 (six) hours as needed for dizziness. 30 tablet 3  . meloxicam (MOBIC) 7.5 MG tablet Take 2 tablets (15 mg total) by mouth daily. 180 tablet 1  . Multiple Vitamins-Iron (MULTIVITAMIN/IRON PO) Take 1 tablet by mouth daily.     [name] oxyCODONE (OXY IR/ROXICODONE) 5 MG immediate release tablet Take 1 tablet (5 mg total) by mouth every 4 (four) hours as needed for moderate pain. One tab every four hours 90 tablet 0  . promethazine (PHENERGAN) 25 MG tablet Take 1 tablet (25 mg total) by mouth every 6 (six) hours as needed for nausea or vomiting. 30 tablet 1  . simvastatin (ZOCOR) 40 MG tablet Take 1 tablet (40 mg total) by mouth at bedtime. 30 tablet 3  . solifenacin (VESICARE) 5 MG tablet Take 5 mg by mouth daily.    . traMADol (ULTRAM) 50 MG tablet Take 50 mg by mouth every 6 (six) hours as needed.    . traZODone (DESYREL) 50 MG tablet     . metoprolol tartrate (LOPRESSOR) 25 MG tablet TAKE ONE-HALF TABLET BY MOUTH IN THE MORNING 30 tablet 0   No facility-administered medications prior to visit.    Allergies  Allergen Reactions  . Cymbalta [Duloxetine Hcl]     Facial edema  . Iohexol      Code: HIVES, Desc: pt states her face and throat swells when administered IV contrast - KB, Onset Date: [id number]   . Mepergan [Meperidine-Promethazine]     Cardiac arrest  . Augmentin [Amoxicillin-Pot Clavulanate] Nausea Only  . Citalopram Other  (See Comments)    Jittery/heart racing   . Hctz [Hydrochlorothiazide] Other (See Comments)    Too  much urinating, felt dried out, etc--NO ALLERGY  . Lexapro [Escitalopram Oxalate] Other (See Comments)    Jittery,heart racing  . Lyrica [Pregabalin] Swelling    Face, hands, fingers, ankles  . Valsartan     REACTION: pt states INTOL to Diovan    ROS As per HPI  PE: Blood pressure 123/82, pulse 75, temperature 98 F (36.7 C), temperature source Oral, resp. rate 16, height 5\' 6"  (1.676 m), weight 193 lb (87.544 kg), SpO2 96 %. VS: noted--normal. Gen: alert, NAD, NONTOXIC APPEARING. AFFECT: pleasant, lucid thought and speech. HEENT: eyes without injection, drainage, or swelling.  Ears: Left EAC clear, TM with normal light reflex and landmarks, slightly retracted.  Left EAC without pain or swelling.  Small amount of white debris in distal canal either in place of the TM or blocking the TM.  Mild erythema round rim of TM can be seen beyond the white debris/pus.  No pain with manipulation of external ear anatomy or insertion of speculum into ear canal.  Nose: Scan clear rhinorrhea, with some dried, crusty exudate adherent to mildly injected mucosa.  No purulent d/c.  No paranasal sinus TTP.  No facial swelling.  Throat and mouth without focal lesion.  No pharyngial swelling, erythema, or exudate.   Neck: supple, no LAD.   LUNGS: CTA bilat, nonlabored resps.   CV: RRR, no m/r/g. EXT: no c/c/e SKIN: no rash  LABS:  none  IMPRESSION AND PLAN:  1) URI, with R AOM with TM rupture. Floxin otic drops, 10 gtts in R ear qd x 10d. Cefdinir 300 mg bid x 7d.  2) Chronic depression: I'm ok with restarting her on citalopram 10mg  tab and she will split this into halfs or quarters per her preference in order to slowly titrate up on the med and hopefully tolerate it plus eventually get therapeutic effect.  3) Essential HTN: The current medical regimen is effective;  continue present plan and  medications. Sent in new rx for lopressor 25mg , 1 tab qd, #90, RF x 3.  An After Visit Summary was printed and given to the patient.  FOLLOW UP: Return for f/u 10-14 d.

## 2015-08-02 ENCOUNTER — Encounter: Payer: Self-pay | Admitting: Family Medicine

## 2015-08-03 ENCOUNTER — Emergency Department (HOSPITAL_BASED_OUTPATIENT_CLINIC_OR_DEPARTMENT_OTHER)
Admission: EM | Admit: 2015-08-03 | Discharge: 2015-08-03 | Disposition: A | Discharge status: Home / Self Care | Payer: Medicare Other | Attending: Emergency Medicine | Admitting: Emergency Medicine

## 2015-08-03 ENCOUNTER — Encounter (HOSPITAL_BASED_OUTPATIENT_CLINIC_OR_DEPARTMENT_OTHER): Payer: Self-pay | Admitting: *Deleted

## 2015-08-03 DIAGNOSIS — I129 Hypertensive chronic kidney disease with stage 1 through stage 4 chronic kidney disease, or unspecified chronic kidney disease: Secondary | ICD-10-CM | POA: Insufficient documentation

## 2015-08-03 DIAGNOSIS — Z86010 Personal history of colonic polyps: Secondary | ICD-10-CM | POA: Insufficient documentation

## 2015-08-03 DIAGNOSIS — Z8719 Personal history of other diseases of the digestive system: Secondary | ICD-10-CM | POA: Diagnosis not present

## 2015-08-03 DIAGNOSIS — M549 Dorsalgia, unspecified: Secondary | ICD-10-CM | POA: Diagnosis not present

## 2015-08-03 DIAGNOSIS — Z79899 Other long term (current) drug therapy: Secondary | ICD-10-CM | POA: Diagnosis not present

## 2015-08-03 DIAGNOSIS — F419 Anxiety disorder, unspecified: Secondary | ICD-10-CM | POA: Diagnosis not present

## 2015-08-03 DIAGNOSIS — G894 Chronic pain syndrome: Secondary | ICD-10-CM | POA: Insufficient documentation

## 2015-08-03 DIAGNOSIS — M797 Fibromyalgia: Secondary | ICD-10-CM | POA: Insufficient documentation

## 2015-08-03 DIAGNOSIS — N182 Chronic kidney disease, stage 2 (mild): Secondary | ICD-10-CM | POA: Diagnosis not present

## 2015-08-03 DIAGNOSIS — M8580 Other specified disorders of bone density and structure, unspecified site: Secondary | ICD-10-CM | POA: Insufficient documentation

## 2015-08-03 DIAGNOSIS — Z862 Personal history of diseases of the blood and blood-forming organs and certain disorders involving the immune mechanism: Secondary | ICD-10-CM | POA: Diagnosis not present

## 2015-08-03 DIAGNOSIS — E559 Vitamin D deficiency, unspecified: Secondary | ICD-10-CM | POA: Insufficient documentation

## 2015-08-03 DIAGNOSIS — E039 Hypothyroidism, unspecified: Secondary | ICD-10-CM | POA: Diagnosis not present

## 2015-08-03 DIAGNOSIS — H6691 Otitis media, unspecified, right ear: Secondary | ICD-10-CM | POA: Diagnosis not present

## 2015-08-03 DIAGNOSIS — H9201 Otalgia, right ear: Secondary | ICD-10-CM | POA: Diagnosis present

## 2015-08-03 DIAGNOSIS — Z872 Personal history of diseases of the skin and subcutaneous tissue: Secondary | ICD-10-CM | POA: Diagnosis not present

## 2015-08-03 DIAGNOSIS — H7291 Unspecified perforation of tympanic membrane, right ear: Secondary | ICD-10-CM | POA: Diagnosis not present

## 2015-08-03 DIAGNOSIS — F339 Major depressive disorder, recurrent, unspecified: Secondary | ICD-10-CM | POA: Insufficient documentation

## 2015-08-03 DIAGNOSIS — E7800 Pure hypercholesterolemia, unspecified: Secondary | ICD-10-CM | POA: Diagnosis not present

## 2015-08-03 MED ORDER — NEOMYCIN-POLYMYXIN-HC 3.5-10000-1 OT SUSP: 3.0000 [drp] | Freq: Four times a day (QID) | OTIC | Status: DC

## 2015-08-03 MED ORDER — NEOMYCIN-POLYMYXIN-HC 1 % OT SOLN
OTIC | Status: AC
  Administered 2015-08-03: 3 [drp]
  Filled 2015-08-03: qty 10

## 2015-08-03 NOTE — Discharge Instructions (Signed)
Your ear canal had pus in the way of where the drops should have been going.  It appears that your ear drum has a large hole in it. Stop taking the Cipro drops you were prescribed and start taking the drops prescribed to you today.  You should take them 3-4 times daily.  Continue the oral antibiotic prescribed.  Follow up with the ear, nose, throat doctor as soon as possible.  Call their office first thing in the morning.  Otitis Media, Adult Otitis media is redness, soreness, and inflammation of the middle ear. Otitis media may be caused by allergies or, most commonly, by infection. Often it occurs as a complication of the common cold. SIGNS AND SYMPTOMS Symptoms of otitis media may include:  Earache.  Fever.  Ringing in your ear.  Headache.  Leakage of fluid from the ear. DIAGNOSIS To diagnose otitis media, your health care provider will examine your ear with an otoscope. This is an instrument that allows your health care provider to see into your ear in order to examine your eardrum. Your health care provider also will ask you questions about your symptoms. TREATMENT  Typically, otitis media resolves on its own within 3-5 days. Your health care provider may prescribe medicine to ease your symptoms of pain. If otitis media does not resolve within 5 days or is recurrent, your health care provider may prescribe antibiotic medicines if he or she suspects that a bacterial infection is the cause. HOME CARE INSTRUCTIONS   If you were prescribed an antibiotic medicine, finish it all even if you start to feel better.  Take medicines only as directed by your health care provider.  Keep all follow-up visits as directed by your health care provider. SEEK MEDICAL CARE IF:  You have otitis media only in one ear, or bleeding from your nose, or both.  You notice a lump on your neck.  You are not getting better in 3-5 days.  You feel worse instead of better. SEEK IMMEDIATE MEDICAL CARE IF:    You have pain that is not controlled with medicine.  You have swelling, redness, or pain around your ear or stiffness in your neck.  You notice that part of your face is paralyzed.  You notice that the bone behind your ear (mastoid) is tender when you touch it. MAKE SURE YOU:   Understand these instructions.  Will watch your condition.  Will get help right away if you are not doing well or get worse.   This information is not intended to replace advice given to you by your health care provider. Make sure you discuss any questions you have with your health care provider.   Document Released: 06/11/2004 Document Revised: 09/27/2014 Document Reviewed: 04/03/2013 Elsevier Interactive Patient Education 2016 [employer].   Tympanic Membrane Perforation The eardrum (tympanic membrane) protects the inner ear from the outside environment. In addition to protection, the eardrum allows you to hear by transmitting sound waves to the bones in your ear and then to the nervous system. The tympanic membrane is easily perforated, which may result in damage to the inner ear. SYMPTOMS   Sometimes there are no symptoms.  Decreased hearing.  Fluid drainage from ear.  Ear pain. CAUSES   Most commonly, a middle ear infection from built-up pressure.  Injury from a cotton swab.  Traumatic injury to the side of the head. RISK INCREASES WITH:  Frequent middle ear infections.  Use of cotton swabs. PREVENTION   Do not use cotton  swabs to clean the ear canal.  If you have ear pain or pressure, see your caregiver to rule out an ear infection that needs treatment. TREATMENT  Protecting the inner ear and allowing the membrane to heal on its own is how tympanic membrane rupture is usually treated. Healing may take several weeks. In order to protect the inner ear, do not allow any fluid to enter the ear canal. Avoid being submerged in water. The use of ear drops may prevent an ear infection from  developing, but they should be used with caution, as ear drops can also cause damage to the inner ear. It is important to follow up with your caregiver to confirm healing of the tympanic membrane. If the membrane does not heal, permanent hearing loss may occur. To avoid serious complications, tympanic membranes that do not heal on their own are repaired with surgery.   This information is not intended to replace advice given to you by your health care provider. Make sure you discuss any questions you have with your health care provider.   Document Released: 09/06/2005 Document Revised: 11/29/2011 Document Reviewed: 03/19/2015 Elsevier Interactive Patient Education [employer].

## 2015-08-03 NOTE — ED Notes (Addendum)
Pt seen at PCP on Thursday for URI and right ear infection with TM rupture. Started on antibiotic ear drops and Omnicef. States pain is worse today with increased pressure in ear

## 2015-08-03 NOTE — ED Provider Notes (Signed)
CSN: [id number]     Arrival date & time 08/03/15  1155 History   First MD Initiated Contact with Patient 08/03/15 1337     Chief Complaint  Patient presents with  . Otalgia     (Consider location/radiation/quality/duration/timing/severity/associated sxs/prior Treatment) HPI Comments: 68 y.o. Female with history of HTN, chronic pain, eczema presents for right ear pain.  The patient was seen by her primary care physician on Thursday for pain in this ear and was diagnosed with a ruptured TM and otitis media.  She was started on Cipro drops and Cefdinir.  She says that the ear has continued to feel like there is pressure in it and the pain has been increasing.  She says it feels like the ear drops are not making it in to where they are supposed to because something is in the way.  Reports the pain does seem to radiate towards her teeth.   Past Medical History  Diagnosis Date  . Allergic rhinitis   . HTN (hypertension)   . Chest pain     cr  . Palpitations   . Hypercholesterolemia   . GERD (gastroesophageal reflux disease)   . Diverticulosis of colon     with hx of 'itis  . History of colonic polyps   . DJD (degenerative joint disease)     L spine and knees  . Lumbar back pain   . Fibromyalgia   . Osteopenia 04/2014    T score -2.0 X. 11%/1.8%  . Vitamin D deficiency   . Anxiety   . Major depression, recurrent ([place])     vs dysthymia,chronic  . Psoriasis   . Monoclonal gammopathy of undetermined significance     IgM kappa MGUS ([name]) routine f/u has shown stability of this problem.  . IC (interstitial cystitis)     [name] at [place]  . Iron deficiency anemia, unspecified 06/27/2013  . Memory impairment 10/03/2012    Memory impairment 10/03/2012 >referral to neuro    . Hypothyroid 07/05/2012  . FATIGUE / MALAISE 07/01/2009    Chronic  . Chronic renal insufficiency, stage II (mild) 2015    CrCl in th 60s.  . Chronic pain syndrome     right knee osteo, hx of TKA  in R knee.  Pt was told by [name] that no further surgical options are available for her--[name]  . IBS (irritable bowel syndrome)   . OSA (obstructive sleep apnea)     does not have CPAP; Dr. Halford Chessman   Past Surgical History  Procedure Laterality Date  . Laminotomy  2010    L-4, L-5 FUSION  . Knee arthroscopy      RIGHT KNEE X 5  . Total knee arthroplasty  12-07 and 4-07    [name], revision 12-09 [name] at [facility]  . Patella fracture surgery  10-08    [name]  . Posterior laminectomy / decompression lumbar spine  9-09    [name]  . Hand surgery Right     TRIGGER FINGER; index finger  . Abdominal hysterectomy      for prolapse with abdominal incision  . Oophorectomy  2010    Laparoscopic BSO (path benign)  . Esophagogastroduodenoscopy  05/29/2009    Esoph normal.  Gastritis (H pylori NEG) + gastric polyps (benign).  Duodenal biopsy NORMAL.  [name] Colonoscopy  04/2006    Diverticulosis, o/w normal.  . Nasal sinus surgery      polypectomy (remote past)  .  Hernia repair      umbilical  . Cholecystectomy    . Cardiac catheterization      no PCI  . Eye surgery Left     "lazy eye"  . Back surgery  [id number]    L2-3 fusion ([name])   Family History  Problem Relation Age of Onset  . Hypertension Mother   . Hypertension Father   . Breast cancer Sister     Age 51's  . Hypertension Sister   . Leukemia Brother   . Lung disease Brother   . Cancer Brother     Melanoma  . Coronary artery disease Sister   . Lupus Sister    Social History  Substance Use Topics  . Smoking status: Never Smoker   . Smokeless tobacco: Never Used     Comment: NEVER USED TOBACCO  . Alcohol Use: No   OB History    Gravida Para Term Preterm AB TAB SAB Ectopic Multiple Living   0              Review of Systems  Constitutional: Negative for fever, chills and fatigue.  HENT: Positive for ear pain (right). Negative for congestion, ear discharge, mouth sores, postnasal drip,  sinus pressure, sore throat and voice change.   Eyes: Negative for pain, redness and visual disturbance.  Respiratory: Negative for cough, chest tightness and shortness of breath.   Cardiovascular: Negative for chest pain and palpitations.  Gastrointestinal: Negative for abdominal pain, diarrhea and constipation.  Genitourinary: Negative for dysuria, urgency, frequency and flank pain.  Musculoskeletal: Positive for back pain (chronic). Negative for myalgias.  Skin: Negative for rash.  Neurological: Negative for weakness, numbness and headaches.  Hematological: Does not bruise/bleed easily.      Allergies  Cymbalta; Iohexol; Mepergan; Augmentin; Citalopram; Hctz; Lexapro; Lyrica; and Valsartan  Home Medications   Prior to Admission medications   Medication Sig Start Date End Date Taking? Authorizing Provider  ALPRAZolam (XANAX) 0.5 MG tablet TAKE ONE-HALF TO ONE TABLET BY MOUTH THREE TIMES DAILY AS NEEDED 03/25/15   [name], MD  Azelastine HCl (ASTEPRO) 0.15 % SOLN 2 sprays each nostril every 12 hours Patient taking differently: Place 2 sprays into the nose 2 (two) times daily as needed (Allergies). 2 sprays each nostril every 12 hours 07/19/14   [name], MD  cefdinir (OMNICEF) 300 MG capsule Take 1 capsule (300 mg total) by mouth 2 (two) times daily. 07/31/15   [name], MD  Cholecalciferol (VITAMIN D) 2000 UNITS CAPS Take 5 capsules by mouth daily.     Historical Provider, MD  citalopram (CELEXA) 10 MG tablet Take 1 tablet (10 mg total) by mouth daily. 07/31/15   [name], MD  cyclobenzaprine (FLEXERIL) 5 MG tablet  05/09/15   Historical Provider, MD  diphenhydrAMINE (BENADRYL) 25 MG tablet Take 25 mg by mouth at bedtime as needed.    Historical Provider, MD  fish oil-omega-3 fatty acids 1000 MG capsule Take 2 g by mouth daily.     Historical Provider, MD  fosinopril (MONOPRIL) 10 MG tablet Take 1 tablet (10 mg total) by mouth daily. 09/23/14   [name], MD  hydrocortisone 2.5 % cream Apply 1 application topically as needed. RECTAL ITCHING 09/08/12   Historical Provider, MD  Inositol Niacinate 500 MG CAPS Take 1 capsule by mouth at bedtime.    Historical Provider, MD  levothyroxine (SYNTHROID, LEVOTHROID) 75 MCG tablet TAKE ONE-HALF TABLET BY MOUTH ONCE DAILY 06/11/15  [name], MD  meclizine (ANTIVERT) 25 MG tablet Take 1 tablet (25 mg total) by mouth every 6 (six) hours as needed for dizziness. 06/25/14   [name], MD  meloxicam (MOBIC) 7.5 MG tablet Take 2 tablets (15 mg total) by mouth daily. 09/02/14   [name], MD  metoprolol tartrate (LOPRESSOR) 25 MG tablet Take 1 tablet (25 mg total) by mouth daily. 07/31/15   [name], MD  Multiple Vitamins-Iron (MULTIVITAMIN/IRON PO) Take 1 tablet by mouth daily.     Historical Provider, MD  ofloxacin (FLOXIN) 0.3 % otic solution Place 10 drops into the right ear daily. 07/31/15   [name], MD  oxyCODONE (OXY IR/ROXICODONE) 5 MG immediate release tablet Take 1 tablet (5 mg total) by mouth every 4 (four) hours as needed for moderate pain. One tab every four hours 01/22/15   Earnie Larsson, MD  promethazine (PHENERGAN) 25 MG tablet Take 1 tablet (25 mg total) by mouth every 6 (six) hours as needed for nausea or vomiting. 02/03/15   [name], MD  simvastatin (ZOCOR) 40 MG tablet Take 1 tablet (40 mg total) by mouth at bedtime. 09/23/14   [name], MD  solifenacin (VESICARE) 5 MG tablet Take 5 mg by mouth daily.    Historical Provider, MD  traMADol (ULTRAM) 50 MG tablet Take 50 mg by mouth every 6 (six) hours as needed.    Historical Provider, MD  traZODone (DESYREL) 50 MG tablet  06/06/15   Historical Provider, MD   BP 119/70 mmHg  Pulse 71  Temp(Src) 98.1 F (36.7 C) (Oral)  Resp 18  Ht 5' 6.5" (1.689 m)  Wt 193 lb (87.544 kg)  BMI 30.69 kg/m2  SpO2 99% Physical Exam  Constitutional: She is oriented to person, place, and time. She appears  well-developed and well-nourished. No distress.  HENT:  Head: Normocephalic and atraumatic.  Right Ear: No mastoid tenderness. Tympanic membrane is perforated (it appears there is a large gapping perforation with purulent drainage).  Left Ear: Tympanic membrane, external ear and ear canal normal.  Nose: Nose normal.  Mouth/Throat: Oropharynx is clear and moist. No oropharyngeal exudate.  Right ear canal without edema or erythema.  Pus into the ear canal initially obstruction visualization of TM which was cleared partially with gentle suctioning.  Right canal and TM without sign of necrotic tissue but with pus present.  Eyes: EOM are normal. Pupils are equal, round, and reactive to light.  Neck: Normal range of motion. Neck supple.  Cardiovascular: Normal rate, regular rhythm, normal heart sounds and intact distal pulses.   No murmur heard. Pulmonary/Chest: Effort normal. No respiratory distress. She has no wheezes. She has no rales.  Abdominal: Soft. She exhibits no distension. There is no tenderness.  Musculoskeletal: Normal range of motion. She exhibits no edema or tenderness.  Neurological: She is alert and oriented to person, place, and time.  Skin: Skin is warm and dry. No rash noted. She is not diaphoretic.  Vitals reviewed.   ED Course  Procedures (including critical care time) Labs Review Labs Reviewed - No data to display  Imaging Review No results found. I have personally reviewed and evaluated these images and lab results as part of my medical decision-making.   EKG Interpretation None      MDM  Patient seen and evaluated in stable condition.  Patient well appearing.  Normal vitals and nontoxic in appearance.  Patient with noted pus in ear canal that was  cleared partially with light suction.  On examination appears to be a large perforation in the TM with purulent drainage.  Ear canal without erythema or edema.  Discussed with [name] from ENT who suggested Ear  culture which was obtained and who also recommended continuing Cefdinir and starting the patient on Cortisporin ear drops.  At this time appearance is not consistent with malignant otitis media and patient appears well.  Discussed at length with patient need for close follow up with ENT tomorrow in the office as recommended by [name].  She expressed agreement and understanding although her husband is scheduled to have surgery tomorrow at 2 PM so she is going to try to be seen in the morning before she needs to take her husband to his procedure.  Patient discharged home with strict return precautions.  Final diagnoses:  None    1. TM perforation  2. Acute otitis media    Harvel Quale, MD 08/03/15 [id number]

## 2015-08-06 DIAGNOSIS — H6241 Otitis externa in other diseases classified elsewhere, right ear: Secondary | ICD-10-CM | POA: Diagnosis not present

## 2015-08-06 DIAGNOSIS — B369 Superficial mycosis, unspecified: Secondary | ICD-10-CM | POA: Diagnosis not present

## 2015-08-08 DIAGNOSIS — G894 Chronic pain syndrome: Secondary | ICD-10-CM | POA: Diagnosis not present

## 2015-08-08 DIAGNOSIS — M4806 Spinal stenosis, lumbar region: Secondary | ICD-10-CM | POA: Diagnosis not present

## 2015-08-08 DIAGNOSIS — M545 Low back pain: Secondary | ICD-10-CM | POA: Diagnosis not present

## 2015-08-08 DIAGNOSIS — K5900 Constipation, unspecified: Secondary | ICD-10-CM | POA: Diagnosis not present

## 2015-08-08 DIAGNOSIS — M62830 Muscle spasm of back: Secondary | ICD-10-CM | POA: Diagnosis not present

## 2015-08-08 DIAGNOSIS — M961 Postlaminectomy syndrome, not elsewhere classified: Secondary | ICD-10-CM | POA: Diagnosis not present

## 2015-08-08 LAB — EAR CULTURE

## 2015-08-09 NOTE — Progress Notes (Signed)
ED Antimicrobial Stewardship Positive Culture Follow Up   [name] is an 69 y.o. female who presented to [facility] on 08/03/2015 with a chief complaint of  Chief Complaint  Patient presents with  . Otalgia    Recent Results (from the past 720 hour(s))  Ear culture     Status: None   Collection Time: 08/03/15  3:55 PM  Result Value Ref Range Status   Specimen Description EAR  Final   Special Requests NONE  Final   Culture   Final    ABUNDANT YEAST CONSISTENT WITH CANDIDA SPECIES Performed at [facility]    Report Status 08/08/2015 FINAL  Final   Patient with a positive ear culture for yeast.  She was to follow-up in Dr. Noreene Filbert office (ENT).  Will fax the culture over to their office.  We have not given anything for fungal treatment here.  ED Provider: Monico Blitz, PA-C  Deontre Allsup L. Nicole Kindred, PharmD PGY2 Infectious Diseases Pharmacy Resident Pager: [phone] 08/09/2015 7:59 AM

## 2015-08-11 DIAGNOSIS — M545 Low back pain: Secondary | ICD-10-CM | POA: Diagnosis not present

## 2015-08-11 DIAGNOSIS — M4806 Spinal stenosis, lumbar region: Secondary | ICD-10-CM | POA: Diagnosis not present

## 2015-08-12 ENCOUNTER — Ambulatory Visit: Payer: Medicare Other | Admitting: Family Medicine

## 2015-08-13 ENCOUNTER — Encounter: Payer: Self-pay | Admitting: Family Medicine

## 2015-08-13 DIAGNOSIS — M7061 Trochanteric bursitis, right hip: Secondary | ICD-10-CM | POA: Diagnosis not present

## 2015-08-21 DIAGNOSIS — Z6831 Body mass index (BMI) 31.0-31.9, adult: Secondary | ICD-10-CM | POA: Diagnosis not present

## 2015-08-21 DIAGNOSIS — M4806 Spinal stenosis, lumbar region: Secondary | ICD-10-CM | POA: Diagnosis not present

## 2015-08-22 DIAGNOSIS — M545 Low back pain: Secondary | ICD-10-CM | POA: Diagnosis not present

## 2015-08-22 DIAGNOSIS — M4806 Spinal stenosis, lumbar region: Secondary | ICD-10-CM | POA: Diagnosis not present

## 2015-08-26 DIAGNOSIS — M545 Low back pain: Secondary | ICD-10-CM | POA: Diagnosis not present

## 2015-08-26 DIAGNOSIS — M4806 Spinal stenosis, lumbar region: Secondary | ICD-10-CM | POA: Diagnosis not present

## 2015-08-27 ENCOUNTER — Ambulatory Visit (INDEPENDENT_AMBULATORY_CARE_PROVIDER_SITE_OTHER): Payer: Medicare Other | Admitting: Gynecology

## 2015-08-27 ENCOUNTER — Encounter: Payer: Self-pay | Admitting: Gynecology

## 2015-08-27 VITALS — BP 122/84 | Ht 66.0 in | Wt 193.0 lb

## 2015-08-27 DIAGNOSIS — N952 Postmenopausal atrophic vaginitis: Secondary | ICD-10-CM | POA: Diagnosis not present

## 2015-08-27 DIAGNOSIS — Z01419 Encounter for gynecological examination (general) (routine) without abnormal findings: Secondary | ICD-10-CM | POA: Diagnosis not present

## 2015-08-27 DIAGNOSIS — M8580 Other specified disorders of bone density and structure, unspecified site: Secondary | ICD-10-CM

## 2015-08-27 NOTE — Progress Notes (Signed)
[name] January 02, 1946 [id number]        69 y.o.  G0P0  For breast and pelvic exam.  Past medical history,surgical history, problem list, medications, allergies, family history and social history were all reviewed and documented as reviewed in the EPIC chart.  ROS:  Performed with pertinent positives and negatives included in the history, assessment and plan.   Additional significant findings :  none   Exam: [name] Vitals:   08/27/15 1200  BP: 122/84  Height: 5\' 6"  (1.676 m)  Weight: 193 lb (87.544 kg)   General appearance:  Normal affect, orientation and appearance. Skin: Grossly normal HEENT: Without gross lesions.  No cervical or supraclavicular adenopathy. Thyroid normal.  Lungs:  Clear without wheezing, rales or rhonchi Cardiac: RR, without RMG Abdominal:  Soft, nontender, without masses, guarding, rebound, organomegaly or hernia Breasts:  Examined lying and sitting without masses, retractions, discharge or axillary adenopathy. Pelvic:  Ext/BUS/vagina with atrophic changes  Adnexa  Without masses or tenderness    Anus and perineum  Normal   Rectovaginal  Normal sphincter tone without palpated masses or tenderness.    Assessment/Plan:  69 y.o. G0P0 female for breast and pelvic exam.   1. Postmenopausal/atrophic genital changes. Status post TAH/BSO in the past. Doing well without significant hot flushes, night sweats, vaginal dryness. Continue to monitor and report any issues. 2. Osteopenia. DEXA 04/2014 T score -2 FRAX 11%/1.8%. Plan repeat DEXA next year at two-year interval. Increased calcium vitamin D reviewed. 3. Mammography do now patients going to call and schedule. SBE monthly reviewed. 4. Pap smear 2011. No Pap smear done today. No history of abnormal Pap smears. We both agree to stop screening based on age and hysterectomy history per current screening guidelines. 5. Colonoscopy 2007. Patient coming due will schedule this coming year.  6. Health  maintenance. No routine lab work done as patient reports is done at her primary physician's office. Follow up 1 year, sooner as needed.   Lisa Auerbach MD, 12:43 PM 08/27/2015

## 2015-08-27 NOTE — Patient Instructions (Signed)
Call to Schedule your mammogram  Facilities in [place]: 1)  [facility]. [facility], [address] Phone: [phone]     Mammogram A mammogram is an X-ray test to find changes in a woman's breast. You should get a mammogram if:  You are 68 years of age or older  You have risk factors.   Your doctor recommends that you have one.  BEFORE THE TEST  Do not schedule the test the week before your period, especially if your breasts are sore during this time.  On the day of your mammogram:  Wash your breasts and armpits well. After washing, do not put on any deodorant or talcum powder on until after your test.   Eat and drink as you usually do.   Take your medicines as usual.   If you are diabetic and take insulin, make sure you:   Eat before coming for your test.   Take your insulin as usual.   If you cannot keep your appointment, call before the appointment to cancel. Schedule another appointment.  TEST  You will need to undress from the waist up. You will put on a hospital gown.   Your breast will be put on the mammogram machine, and it will press firmly on your breast with a piece of plastic called a compression paddle. This will make your breast flatter so that the machine can X-ray all parts of your breast.   Both breasts will be X-rayed. Each breast will be X-rayed from above and from the side. An X-ray might need to be taken again if the picture is not good enough.   The mammogram will last about 15 to 30 minutes.  AFTER THE TEST Finding out the results of your test Ask when your test results will be ready. Make sure you get your test results.  Document Released: 12/03/2008 Document Revised: 08/26/2011 Document Reviewed: 12/03/2008 ExitCare Patient Information 2012 [employer].  You may obtain a copy of any labs that were done today by logging onto MyChart as outlined in the instructions provided with  your AVS (after visit summary). The office will not call with normal lab results but certainly if there are any significant abnormalities then we will contact you.   Health Maintenance Adopting a healthy lifestyle and getting preventive care can go a long way to promote health and wellness. Talk with your health care provider about what schedule of regular examinations is right for you. This is a good chance for you to check in with your provider about disease prevention and staying healthy. In between checkups, there are plenty of things you can do on your own. Experts have done a lot of research about which lifestyle changes and preventive measures are most likely to keep you healthy. Ask your health care provider for more information. WEIGHT AND DIET  Eat a healthy diet  Be sure to include plenty of vegetables, fruits, low-fat dairy products, and lean protein.  Do not eat a lot of foods high in solid fats, added sugars, or salt.  Get regular exercise. This is one of the most important things you can do for your health.  Most adults should exercise for at least 150 minutes each week. The exercise should increase your heart rate and make you sweat (moderate-intensity exercise).  Most adults should also do strengthening exercises at least twice a week. This is in addition to the moderate-intensity exercise.  Maintain a healthy weight    Body mass index (BMI) is a measurement that can be used to identify possible weight problems. It estimates body fat based on height and weight. Your health care provider can help determine your BMI and help you achieve or maintain a healthy weight.  For females 20 years of age and older:   A BMI below 18.5 is considered underweight.  A BMI of 18.5 to 24.9 is normal.  A BMI of 25 to 29.9 is considered overweight.  A BMI of 30 and above is considered obese.  Watch levels of cholesterol and blood lipids  You should start having your blood tested for  lipids and cholesterol at 68 years of age, then have this test every 5 years.  You may need to have your cholesterol levels checked more often if:  Your lipid or cholesterol levels are high.  You are older than 68 years of age.  You are at high risk for heart disease.  CANCER SCREENING   Lung Cancer  Lung cancer screening is recommended for adults 55-80 years old who are at high risk for lung cancer because of a history of smoking.  A yearly low-dose CT scan of the lungs is recommended for people who:  Currently smoke.  Have quit within the past 15 years.  Have at least a 30-pack-year history of smoking. A pack year is smoking an average of one pack of cigarettes a day for 1 year.  Yearly screening should continue until it has been 15 years since you quit.  Yearly screening should stop if you develop a health problem that would prevent you from having lung cancer treatment.  Breast Cancer  Practice breast self-awareness. This means understanding how your breasts normally appear and feel.  It also means doing regular breast self-exams. Let your health care provider know about any changes, no matter how small.  If you are in your 20s or 30s, you should have a clinical breast exam (CBE) by a health care provider every 1-3 years as part of a regular health exam.  If you are 40 or older, have a CBE every year. Also consider having a breast X-ray (mammogram) every year.  If you have a family history of breast cancer, talk to your health care provider about genetic screening.  If you are at high risk for breast cancer, talk to your health care provider about having an MRI and a mammogram every year.  Breast cancer gene (BRCA) assessment is recommended for women who have family members with BRCA-related cancers. BRCA-related cancers include:  Breast.  Ovarian.  Tubal.  Peritoneal cancers.  Results of the assessment will determine the need for genetic counseling and BRCA1  and BRCA2 testing. Cervical Cancer Routine pelvic examinations to screen for cervical cancer are no longer recommended for nonpregnant women who are considered low risk for cancer of the pelvic organs (ovaries, uterus, and vagina) and who do not have symptoms. A pelvic examination may be necessary if you have symptoms including those associated with pelvic infections. Ask your health care provider if a screening pelvic exam is right for you.   The Pap test is the screening test for cervical cancer for women who are considered at risk.  If you had a hysterectomy for a problem that was not cancer or a condition that could lead to cancer, then you no longer need Pap tests.  If you are older than 65 years, and you have had normal Pap tests for the past 10 years, you no longer need   to have Pap tests.  If you have had past treatment for cervical cancer or a condition that could lead to cancer, you need Pap tests and screening for cancer for at least 20 years after your treatment.  If you no longer get a Pap test, assess your risk factors if they change (such as having a new sexual partner). This can affect whether you should start being screened again.  Some women have medical problems that increase their chance of getting cervical cancer. If this is the case for you, your health care provider may recommend more frequent screening and Pap tests.  The human papillomavirus (HPV) test is another test that may be used for cervical cancer screening. The HPV test looks for the virus that can cause cell changes in the cervix. The cells collected during the Pap test can be tested for HPV.  The HPV test can be used to screen women 30 years of age and older. Getting tested for HPV can extend the interval between normal Pap tests from three to five years.  An HPV test also should be used to screen women of any age who have unclear Pap test results.  After 68 years of age, women should have HPV testing as often  as Pap tests.  Colorectal Cancer  This type of cancer can be detected and often prevented.  Routine colorectal cancer screening usually begins at 68 years of age and continues through 68 years of age.  Your health care provider may recommend screening at an earlier age if you have risk factors for colon cancer.  Your health care provider may also recommend using home test kits to check for hidden blood in the stool.  A small camera at the end of a tube can be used to examine your colon directly (sigmoidoscopy or colonoscopy). This is done to check for the earliest forms of colorectal cancer.  Routine screening usually begins at age 50.  Direct examination of the colon should be repeated every 5-10 years through 68 years of age. However, you may need to be screened more often if early forms of precancerous polyps or small growths are found. Skin Cancer  Check your skin from head to toe regularly.  Tell your health care provider about any new moles or changes in moles, especially if there is a change in a mole's shape or color.  Also tell your health care provider if you have a mole that is larger than the size of a pencil eraser.  Always use sunscreen. Apply sunscreen liberally and repeatedly throughout the day.  Protect yourself by wearing long sleeves, pants, a wide-brimmed hat, and sunglasses whenever you are outside. HEART DISEASE, DIABETES, AND HIGH BLOOD PRESSURE   Have your blood pressure checked at least every 1-2 years. High blood pressure causes heart disease and increases the risk of stroke.  If you are between 55 years and 79 years old, ask your health care provider if you should take aspirin to prevent strokes.  Have regular diabetes screenings. This involves taking a blood sample to check your fasting blood sugar level.  If you are at a normal weight and have a low risk for diabetes, have this test once every three years after 68 years of age.  If you are overweight  and have a high risk for diabetes, consider being tested at a younger age or more often. PREVENTING INFECTION  Hepatitis B  If you have a higher risk for hepatitis B, you should be screened for   this virus. You are considered at high risk for hepatitis B if:  You were born in a country where hepatitis B is common. Ask your health care provider which countries are considered high risk.  Your parents were born in a high-risk country, and you have not been immunized against hepatitis B (hepatitis B vaccine).  You have HIV or AIDS.  You use needles to inject street drugs.  You live with someone who has hepatitis B.  You have had sex with someone who has hepatitis B.  You get hemodialysis treatment.  You take certain medicines for conditions, including cancer, organ transplantation, and autoimmune conditions. Hepatitis C  Blood testing is recommended for:  Everyone born from 1945 through 1965.  Anyone with known risk factors for hepatitis C. Sexually transmitted infections (STIs)  You should be screened for sexually transmitted infections (STIs) including gonorrhea and chlamydia if:  You are sexually active and are younger than 68 years of age.  You are older than 68 years of age and your health care provider tells you that you are at risk for this type of infection.  Your sexual activity has changed since you were last screened and you are at an increased risk for chlamydia or gonorrhea. Ask your health care provider if you are at risk.  If you do not have HIV, but are at risk, it may be recommended that you take a prescription medicine daily to prevent HIV infection. This is called pre-exposure prophylaxis (PrEP). You are considered at risk if:  You are sexually active and do not regularly use condoms or know the HIV status of your partner(s).  You take drugs by injection.  You are sexually active with a partner who has HIV. Talk with your health care provider about whether  you are at high risk of being infected with HIV. If you choose to begin PrEP, you should first be tested for HIV. You should then be tested every 3 months for as long as you are taking PrEP.  PREGNANCY   If you are premenopausal and you may become pregnant, ask your health care provider about preconception counseling.  If you may become pregnant, take 400 to 800 micrograms (mcg) of folic acid every day.  If you want to prevent pregnancy, talk to your health care provider about birth control (contraception). OSTEOPOROSIS AND MENOPAUSE   Osteoporosis is a disease in which the bones lose minerals and strength with aging. This can result in serious bone fractures. Your risk for osteoporosis can be identified using a bone density scan.  If you are 65 years of age or older, or if you are at risk for osteoporosis and fractures, ask your health care provider if you should be screened.  Ask your health care provider whether you should take a calcium or vitamin D supplement to lower your risk for osteoporosis.  Menopause may have certain physical symptoms and risks.  Hormone replacement therapy may reduce some of these symptoms and risks. Talk to your health care provider about whether hormone replacement therapy is right for you.  HOME CARE INSTRUCTIONS   Schedule regular health, dental, and eye exams.  Stay current with your immunizations.   Do not use any tobacco products including cigarettes, chewing tobacco, or electronic cigarettes.  If you are pregnant, do not drink alcohol.  If you are breastfeeding, limit how much and how often you drink alcohol.  Limit alcohol intake to no more than 1 drink per day for nonpregnant women. One drink   equals 12 ounces of beer, 5 ounces of wine, or 1 ounces of hard liquor.  Do not use street drugs.  Do not share needles.  Ask your health care provider for help if you need support or information about quitting drugs.  Tell your health care  provider if you often feel depressed.  Tell your health care provider if you have ever been abused or do not feel safe at home. Document Released: 03/22/2011 Document Revised: 01/21/2014 Document Reviewed: 08/08/2013 Exit[employer] Patient Information 2015 [employer]. This information is not intended to replace advice given to you by your health care provider. Make sure you discuss any questions you have with your health care provider.  

## 2015-08-28 DIAGNOSIS — L650 Telogen effluvium: Secondary | ICD-10-CM | POA: Diagnosis not present

## 2015-08-28 DIAGNOSIS — D692 Other nonthrombocytopenic purpura: Secondary | ICD-10-CM | POA: Diagnosis not present

## 2015-08-28 DIAGNOSIS — L853 Xerosis cutis: Secondary | ICD-10-CM | POA: Diagnosis not present

## 2015-08-28 DIAGNOSIS — L821 Other seborrheic keratosis: Secondary | ICD-10-CM | POA: Diagnosis not present

## 2015-09-01 DIAGNOSIS — M4806 Spinal stenosis, lumbar region: Secondary | ICD-10-CM | POA: Diagnosis not present

## 2015-09-01 DIAGNOSIS — M545 Low back pain: Secondary | ICD-10-CM | POA: Diagnosis not present

## 2015-09-04 DIAGNOSIS — M4806 Spinal stenosis, lumbar region: Secondary | ICD-10-CM | POA: Diagnosis not present

## 2015-09-04 DIAGNOSIS — M545 Low back pain: Secondary | ICD-10-CM | POA: Diagnosis not present

## 2015-09-05 DIAGNOSIS — G894 Chronic pain syndrome: Secondary | ICD-10-CM | POA: Diagnosis not present

## 2015-09-05 DIAGNOSIS — M62830 Muscle spasm of back: Secondary | ICD-10-CM | POA: Diagnosis not present

## 2015-09-05 DIAGNOSIS — K5900 Constipation, unspecified: Secondary | ICD-10-CM | POA: Diagnosis not present

## 2015-09-05 DIAGNOSIS — M961 Postlaminectomy syndrome, not elsewhere classified: Secondary | ICD-10-CM | POA: Diagnosis not present

## 2015-09-05 DIAGNOSIS — Z79891 Long term (current) use of opiate analgesic: Secondary | ICD-10-CM | POA: Diagnosis not present

## 2015-09-08 ENCOUNTER — Ambulatory Visit: Payer: Medicare Other | Admitting: Family Medicine

## 2015-09-08 DIAGNOSIS — M545 Low back pain: Secondary | ICD-10-CM | POA: Diagnosis not present

## 2015-09-08 DIAGNOSIS — M4806 Spinal stenosis, lumbar region: Secondary | ICD-10-CM | POA: Diagnosis not present

## 2015-09-10 ENCOUNTER — Ambulatory Visit (INDEPENDENT_AMBULATORY_CARE_PROVIDER_SITE_OTHER): Payer: Medicare Other | Admitting: Family Medicine

## 2015-09-10 ENCOUNTER — Encounter: Payer: Self-pay | Admitting: Family Medicine

## 2015-09-10 VITALS — BP 136/93 | HR 75 | Temp 97.8°F | Resp 16 | Ht 66.0 in | Wt 193.5 lb

## 2015-09-10 DIAGNOSIS — Z23 Encounter for immunization: Secondary | ICD-10-CM

## 2015-09-10 DIAGNOSIS — E785 Hyperlipidemia, unspecified: Secondary | ICD-10-CM

## 2015-09-10 DIAGNOSIS — J0101 Acute recurrent maxillary sinusitis: Secondary | ICD-10-CM

## 2015-09-10 DIAGNOSIS — F341 Dysthymic disorder: Secondary | ICD-10-CM | POA: Diagnosis not present

## 2015-09-10 DIAGNOSIS — I10 Essential (primary) hypertension: Secondary | ICD-10-CM

## 2015-09-10 DIAGNOSIS — M4806 Spinal stenosis, lumbar region: Secondary | ICD-10-CM | POA: Diagnosis not present

## 2015-09-10 DIAGNOSIS — M545 Low back pain: Secondary | ICD-10-CM | POA: Diagnosis not present

## 2015-09-10 MED ORDER — SIMVASTATIN 40 MG PO TABS: 40.0000 mg | ORAL_TABLET | Freq: Every day | ORAL | Status: DC

## 2015-09-10 MED ORDER — PROMETHAZINE HCL 25 MG PO TABS: 25.0000 mg | ORAL_TABLET | Freq: Four times a day (QID) | ORAL | Status: DC | PRN

## 2015-09-10 MED ORDER — AZITHROMYCIN 250 MG PO TABS: ORAL_TABLET | ORAL | Status: DC

## 2015-09-10 NOTE — Progress Notes (Signed)
Pre visit review using our clinic review tool, if applicable. No additional management support is needed unless otherwise documented below in the visit note. 

## 2015-09-10 NOTE — Progress Notes (Signed)
OFFICE VISIT  09/11/2015   CC:  Chief Complaint  Patient presents with  . Follow-up    Pt is not fasting.    HPI:    Patient is a 69 y.o. Caucasian female who presents for 3 mo f/u chronic medical issues/polypharmacy. Recent fungal infection of R ear, saw ENT and he gave her antifungal tx. She is still putting drops in ear --has a 1 mo course.  She is set to f/u with him.    She'll be establishing with a new counselor at the beginning of 2017.  She has been intolerant of several psychotropic meds and wishes to avoid these at these times.  Started getting URI/PND about 10d ago, now the mucous seems to have cleared up some but has pain around eyes and face, upper teeth soreness on L.  Voice a bit hoarse.  Some coughing almost exclusively at night, but no wheezing or fever.  Getting PT for low back currently.  She does occ home bp monitoring: normal range. Past Medical History  Diagnosis Date  . Allergic rhinitis   . HTN (hypertension)   . Chest pain     cr  . Palpitations   . Hypercholesterolemia   . GERD (gastroesophageal reflux disease)   . Diverticulosis of colon     with hx of 'itis  . History of colonic polyps   . DJD (degenerative joint disease)     L spine and knees  . Lumbar back pain   . Fibromyalgia   . Osteopenia 04/2014    T score -2.0 FRAX 11%/1.8%  . Vitamin D deficiency   . Anxiety   . Major depression, recurrent ([place])     vs dysthymia,chronic  . Psoriasis   . Monoclonal gammopathy of undetermined significance     IgM kappa MGUS ([name]) routine f/u has shown stability of this problem.  . IC (interstitial cystitis)     [name] at [facility]  . Iron deficiency anemia, unspecified 06/27/2013  . Memory impairment 10/03/2012    Memory impairment 10/03/2012 >referral to neuro    . Hypothyroid 07/05/2012  . FATIGUE / MALAISE 07/01/2009    Chronic  . Chronic renal insufficiency, stage II (mild) 2015    CrCl in th 60s.  . Chronic pain syndrome     right knee osteo, hx of TKA in R knee.  Pt was told by [name] that no further surgical options are available for her--[name]  . IBS (irritable bowel syndrome)   . OSA (obstructive sleep apnea)     does not have CPAP; Dr. Halford Chessman  . Otitis externa, candida [id number]    [name] tx'd her with clotrimazole 1% external solution x 1 mo    Past Surgical History  Procedure Laterality Date  . Laminotomy  2010    L-4, L-5 FUSION  . Knee arthroscopy      RIGHT KNEE X 5  . Total knee arthroplasty  12-07 and 4-07    [name], revision 12-09 [name] at [facility]  . Patella fracture surgery  10-08    [name]  . Posterior laminectomy / decompression lumbar spine  9-09    [name]  . Hand surgery Right     TRIGGER FINGER; index finger  . Abdominal hysterectomy      for prolapse with abdominal incision  . Oophorectomy  2010    Laparoscopic BSO (path benign)  . Esophagogastroduodenoscopy  05/29/2009    Esoph normal.  Gastritis (H  pylori NEG) + gastric polyps (benign).  Duodenal biopsy NORMAL.  [name] Colonoscopy  04/2006    Diverticulosis, o/w normal.  . Nasal sinus surgery      polypectomy (remote past)  . Hernia repair      umbilical  . Cholecystectomy    . Cardiac catheterization      no PCI  . Eye surgery Left     "lazy eye"  . Back surgery  [id number]    L2-3 fusion ([name])    Outpatient Prescriptions Prior to Visit  Medication Sig Dispense Refill  . ALPRAZolam (XANAX) 0.5 MG tablet TAKE ONE-HALF TO ONE TABLET BY MOUTH THREE TIMES DAILY AS NEEDED 90 tablet 5  . Azelastine HCl (ASTEPRO) 0.15 % SOLN 2 sprays each nostril every 12 hours (Patient taking differently: Place 2 sprays into the nose 2 (two) times daily as needed (Allergies). 2 sprays each nostril every 12 hours) 30 mL 11  . Cholecalciferol (VITAMIN D) 2000 UNITS CAPS Take 5 capsules by mouth daily.     . cyclobenzaprine (FLEXERIL) 5 MG tablet     . diphenhydrAMINE (BENADRYL) 25 MG tablet Take 25 mg by mouth  at bedtime as needed.    . fish oil-omega-3 fatty acids 1000 MG capsule Take 2 g by mouth daily.     . hydrocortisone 2.5 % cream Apply 1 application topically as needed. RECTAL ITCHING    . Inositol Niacinate 500 MG CAPS Take 1 capsule by mouth at bedtime.    [name] levothyroxine (SYNTHROID, LEVOTHROID) 75 MCG tablet TAKE ONE-HALF TABLET BY MOUTH ONCE DAILY 45 tablet 11  . meclizine (ANTIVERT) 25 MG tablet Take 1 tablet (25 mg total) by mouth every 6 (six) hours as needed for dizziness. 30 tablet 3  . meloxicam (MOBIC) 7.5 MG tablet Take 2 tablets (15 mg total) by mouth daily. 180 tablet 1  . metoprolol tartrate (LOPRESSOR) 25 MG tablet Take 1 tablet (25 mg total) by mouth daily. 90 tablet 3  . Multiple Vitamins-Iron (MULTIVITAMIN/IRON PO) Take 1 tablet by mouth daily.     [name] ofloxacin (FLOXIN) 0.3 % otic solution Place 10 drops into the right ear daily. 5 mL 0  . oxyCODONE (OXY IR/ROXICODONE) 5 MG immediate release tablet Take 1 tablet (5 mg total) by mouth every 4 (four) hours as needed for moderate pain. One tab every four hours 90 tablet 0  . solifenacin (VESICARE) 5 MG tablet Take 5 mg by mouth daily.    . traMADol (ULTRAM) 50 MG tablet Take 50 mg by mouth every 6 (six) hours as needed.    . traZODone (DESYREL) 50 MG tablet     . citalopram (CELEXA) 10 MG tablet Take 1 tablet (10 mg total) by mouth daily. 30 tablet 2  . promethazine (PHENERGAN) 25 MG tablet Take 1 tablet (25 mg total) by mouth every 6 (six) hours as needed for nausea or vomiting. 30 tablet 1  . simvastatin (ZOCOR) 40 MG tablet Take 1 tablet (40 mg total) by mouth at bedtime. 30 tablet 3  . fosinopril (MONOPRIL) 10 MG tablet Take 1 tablet (10 mg total) by mouth daily. (Patient not taking: Reported on 09/10/2015) 30 tablet 3   No facility-administered medications prior to visit.    Allergies  Allergen Reactions  . Cymbalta [Duloxetine Hcl]     Facial edema  . Iohexol      Code: HIVES, Desc: pt states her face and throat  swells when administered IV contrast - KB, Onset Date: [id number]   .  Mepergan [Meperidine-Promethazine]     Cardiac arrest  . Augmentin [Amoxicillin-Pot Clavulanate] Nausea Only  . Citalopram Other (See Comments)    Jittery/heart racing   . Hctz [Hydrochlorothiazide] Other (See Comments)    Too much urinating, felt dried out, etc--NO ALLERGY  . Lexapro [Escitalopram Oxalate] Other (See Comments)    Jittery,heart racing  . Lyrica [Pregabalin] Swelling    Face, hands, fingers, ankles  . Valsartan     REACTION: pt states INTOL to Diovan    ROS As per HPI  PE: Blood pressure 136/93, pulse 75, temperature 97.8 F (36.6 C), temperature source Oral, resp. rate 16, height 5\' 6"  (1.676 m), weight 193 lb 8 oz (87.771 kg), SpO2 95 %. VS: noted--normal. Gen: alert, NAD, NONTOXIC APPEARING. HEENT: eyes without injection, drainage, or swelling.  Ears: EACs clear, TMs with normal light reflex and landmarks.  Nose: Clear rhinorrhea, with some dried, crusty exudate adherent to mildly injected mucosa.  No purulent d/c.  Moderate L>R paranasal sinus TTP.  No facial swelling.  Throat and mouth without focal lesion.  No pharyngial swelling, erythema, or exudate.   Neck: supple, no LAD.   LUNGS: CTA bilat, nonlabored resps.   CV: RRR, no m/r/g. EXT: no c/c/e SKIN: no rash  LABS:  None today  Lab Results  Component Value Date   CHOL 209* 09/23/2014   HDL 36.60* 09/23/2014   LDLCALC 138* 09/23/2014   LDLDIRECT 187.8 08/13/2013   TRIG 172.0* 09/23/2014   CHOLHDL 6 09/23/2014   Lab Results  Component Value Date   TSH 1.45 06/04/2015   IMPRESSION AND PLAN:  1) Acute maxillary sinusitis, recurrent: Z pack rx'd today. Saline nasal rinse encouraged.  Continue astepro nasal spray.  2) HTN: The current medical regimen is effective;  continue present plan and medications. Doing fine on 1/2 of 25mg  lopressor bid.  She stopped fosinopril b/c it "didn't agree" with her.  3) Dysthymia: continues  to decline psychotropic meds other than her xanax (due to lack of efficacy as well as hx of intolerance to several). She is going to get set up with a new counselor 09/2015.  4) Hyperlipidemia: unfortunately she continues to come to her f/u appts NOT fasting. Plan on repeat of lipid panel when fasting at next f/u in 2 mo.  5) Recent R fungal otitis externa: resolved/successfully treated by her ENT, [name].  An After Visit Summary was printed and given to the patient.  FOLLOW UP: Return in about 2 months (around 11/11/2015) for routine chronic illness f/u-fasting (30 min).

## 2015-09-11 DIAGNOSIS — J310 Chronic rhinitis: Secondary | ICD-10-CM | POA: Diagnosis not present

## 2015-09-16 DIAGNOSIS — M545 Low back pain: Secondary | ICD-10-CM | POA: Diagnosis not present

## 2015-09-16 DIAGNOSIS — M4806 Spinal stenosis, lumbar region: Secondary | ICD-10-CM | POA: Diagnosis not present

## 2015-09-24 DIAGNOSIS — M545 Low back pain: Secondary | ICD-10-CM | POA: Diagnosis not present

## 2015-09-24 DIAGNOSIS — M4806 Spinal stenosis, lumbar region: Secondary | ICD-10-CM | POA: Diagnosis not present

## 2015-10-01 DIAGNOSIS — M545 Low back pain: Secondary | ICD-10-CM | POA: Diagnosis not present

## 2015-10-01 DIAGNOSIS — M4806 Spinal stenosis, lumbar region: Secondary | ICD-10-CM | POA: Diagnosis not present

## 2015-10-02 ENCOUNTER — Telehealth: Payer: Self-pay | Admitting: *Deleted

## 2015-10-02 DIAGNOSIS — M961 Postlaminectomy syndrome, not elsewhere classified: Secondary | ICD-10-CM | POA: Diagnosis not present

## 2015-10-02 DIAGNOSIS — G894 Chronic pain syndrome: Secondary | ICD-10-CM | POA: Diagnosis not present

## 2015-10-02 DIAGNOSIS — M62830 Muscle spasm of back: Secondary | ICD-10-CM | POA: Diagnosis not present

## 2015-10-02 DIAGNOSIS — K5900 Constipation, unspecified: Secondary | ICD-10-CM | POA: Diagnosis not present

## 2015-10-02 MED ORDER — FLUCONAZOLE 150 MG PO TABS: 150.0000 mg | ORAL_TABLET | Freq: Once | ORAL | Status: DC

## 2015-10-02 NOTE — Telephone Encounter (Signed)
Pt called c/o yeast infection itching and white discharge, asked if you would be willing to send Rx for Diflucan? Please advise

## 2015-10-02 NOTE — Telephone Encounter (Signed)
Pt aware Rx sent, pt will hold off on cholesterol medication that day

## 2015-10-02 NOTE — Telephone Encounter (Signed)
Diflucan 150 mg 1 dose. If she is taking cholesterol medication hold that the day that she takes the Diflucan

## 2015-10-31 DIAGNOSIS — M961 Postlaminectomy syndrome, not elsewhere classified: Secondary | ICD-10-CM | POA: Diagnosis not present

## 2015-10-31 DIAGNOSIS — M62830 Muscle spasm of back: Secondary | ICD-10-CM | POA: Diagnosis not present

## 2015-10-31 DIAGNOSIS — K5900 Constipation, unspecified: Secondary | ICD-10-CM | POA: Diagnosis not present

## 2015-10-31 DIAGNOSIS — G894 Chronic pain syndrome: Secondary | ICD-10-CM | POA: Diagnosis not present

## 2015-11-01 ENCOUNTER — Other Ambulatory Visit: Payer: Self-pay | Admitting: Family Medicine

## 2015-11-03 NOTE — Telephone Encounter (Signed)
Rx faxed

## 2015-11-03 NOTE — Telephone Encounter (Signed)
RF request for alprazolam LOV: 09/10/15 Next ov: 11/12/15 Last written: 03/25/15 #90 w/ 5RF  Please advise. Thanks.

## 2015-11-10 DIAGNOSIS — M1712 Unilateral primary osteoarthritis, left knee: Secondary | ICD-10-CM | POA: Diagnosis not present

## 2015-11-10 DIAGNOSIS — Z96651 Presence of right artificial knee joint: Secondary | ICD-10-CM | POA: Diagnosis not present

## 2015-11-12 ENCOUNTER — Telehealth: Payer: Self-pay | Admitting: Family Medicine

## 2015-11-12 ENCOUNTER — Ambulatory Visit: Payer: BLUE CROSS/BLUE SHIELD | Admitting: Family Medicine

## 2015-11-12 DIAGNOSIS — I10 Essential (primary) hypertension: Secondary | ICD-10-CM

## 2015-11-12 DIAGNOSIS — E785 Hyperlipidemia, unspecified: Secondary | ICD-10-CM

## 2015-11-12 NOTE — Telephone Encounter (Signed)
OK, labs ordered (future--needs to be FASTING!)

## 2015-11-12 NOTE — Telephone Encounter (Signed)
Please advise. Thanks.  

## 2015-11-12 NOTE — Telephone Encounter (Signed)
Pt had to cx her appt today due to attending a funeral. States she was to have a lipid panel drawn and would like to know if she can schedule to have that done still and then have an appt so that she can have the results at the appt.

## 2015-11-13 NOTE — Telephone Encounter (Addendum)
Pts husband advised and voiced understanding. Okay per DPR.

## 2015-11-17 ENCOUNTER — Other Ambulatory Visit: Payer: Self-pay | Admitting: Family Medicine

## 2015-11-17 MED ORDER — MECLIZINE HCL 25 MG PO TABS: 25.0000 mg | ORAL_TABLET | Freq: Four times a day (QID) | ORAL | Status: DC | PRN

## 2015-11-17 NOTE — Telephone Encounter (Signed)
Future lab orders in EMR. Okay for [place] lab.

## 2015-11-17 NOTE — Telephone Encounter (Signed)
RF request for meclizine - 90 days supply LOV: 09/10/15 Next ov: None Last written: 06/25/14 #30 w/ 3RF  Please advise. Thanks.

## 2015-11-17 NOTE — Telephone Encounter (Signed)
Mecilizine 90 day supply to [facility] on [address]

## 2015-11-17 NOTE — Telephone Encounter (Signed)
Patient is going to the [facility] lab, please make sure the order is good for their location. Thanks

## 2015-11-19 DIAGNOSIS — Z6831 Body mass index (BMI) 31.0-31.9, adult: Secondary | ICD-10-CM | POA: Diagnosis not present

## 2015-11-19 DIAGNOSIS — M4806 Spinal stenosis, lumbar region: Secondary | ICD-10-CM | POA: Diagnosis not present

## 2015-11-20 ENCOUNTER — Other Ambulatory Visit (HOSPITAL_BASED_OUTPATIENT_CLINIC_OR_DEPARTMENT_OTHER): Payer: Self-pay | Admitting: Neurosurgery

## 2015-11-20 DIAGNOSIS — M48061 Spinal stenosis, lumbar region without neurogenic claudication: Secondary | ICD-10-CM

## 2015-11-21 DIAGNOSIS — M4806 Spinal stenosis, lumbar region: Secondary | ICD-10-CM | POA: Diagnosis not present

## 2015-11-27 DIAGNOSIS — M7061 Trochanteric bursitis, right hip: Secondary | ICD-10-CM | POA: Diagnosis not present

## 2015-11-29 ENCOUNTER — Ambulatory Visit (HOSPITAL_BASED_OUTPATIENT_CLINIC_OR_DEPARTMENT_OTHER): Admission: RE | Admit: 2015-11-29 | Payer: Medicare Other | Source: Ambulatory Visit

## 2015-11-29 ENCOUNTER — Ambulatory Visit (HOSPITAL_BASED_OUTPATIENT_CLINIC_OR_DEPARTMENT_OTHER): Payer: Medicare Other

## 2015-12-01 DIAGNOSIS — M62830 Muscle spasm of back: Secondary | ICD-10-CM | POA: Diagnosis not present

## 2015-12-01 DIAGNOSIS — G894 Chronic pain syndrome: Secondary | ICD-10-CM | POA: Diagnosis not present

## 2015-12-01 DIAGNOSIS — K5900 Constipation, unspecified: Secondary | ICD-10-CM | POA: Diagnosis not present

## 2015-12-01 DIAGNOSIS — M961 Postlaminectomy syndrome, not elsewhere classified: Secondary | ICD-10-CM | POA: Diagnosis not present

## 2015-12-06 ENCOUNTER — Ambulatory Visit (HOSPITAL_BASED_OUTPATIENT_CLINIC_OR_DEPARTMENT_OTHER)
Admission: RE | Admit: 2015-12-06 | Discharge: 2015-12-06 | Disposition: A | Discharge status: Home / Self Care | Payer: Medicare Other | Source: Ambulatory Visit | Attending: Neurosurgery | Admitting: Neurosurgery

## 2015-12-06 ENCOUNTER — Other Ambulatory Visit: Payer: Self-pay | Admitting: Gynecology

## 2015-12-06 DIAGNOSIS — M48061 Spinal stenosis, lumbar region without neurogenic claudication: Secondary | ICD-10-CM

## 2015-12-06 DIAGNOSIS — M5126 Other intervertebral disc displacement, lumbar region: Secondary | ICD-10-CM | POA: Insufficient documentation

## 2015-12-06 DIAGNOSIS — Z1231 Encounter for screening mammogram for malignant neoplasm of breast: Secondary | ICD-10-CM

## 2015-12-06 DIAGNOSIS — M8938 Hypertrophy of bone, other site: Secondary | ICD-10-CM | POA: Insufficient documentation

## 2015-12-06 DIAGNOSIS — M4806 Spinal stenosis, lumbar region: Secondary | ICD-10-CM | POA: Insufficient documentation

## 2015-12-06 DIAGNOSIS — M4326 Fusion of spine, lumbar region: Secondary | ICD-10-CM | POA: Diagnosis not present

## 2015-12-06 MED ORDER — GADOBENATE DIMEGLUMINE 529 MG/ML IV SOLN: 15.0000 mL | Freq: Once | INTRAVENOUS | Status: DC | PRN

## 2015-12-17 ENCOUNTER — Other Ambulatory Visit (HOSPITAL_BASED_OUTPATIENT_CLINIC_OR_DEPARTMENT_OTHER): Payer: Medicare Other

## 2015-12-17 ENCOUNTER — Ambulatory Visit (HOSPITAL_BASED_OUTPATIENT_CLINIC_OR_DEPARTMENT_OTHER): Payer: Medicare Other | Admitting: Family

## 2015-12-17 ENCOUNTER — Encounter: Payer: Self-pay | Admitting: Hematology & Oncology

## 2015-12-17 VITALS — BP 134/81 | HR 67 | Temp 97.9°F | Resp 16 | Ht 66.0 in | Wt 194.0 lb

## 2015-12-17 DIAGNOSIS — D472 Monoclonal gammopathy: Secondary | ICD-10-CM | POA: Diagnosis not present

## 2015-12-17 DIAGNOSIS — G8929 Other chronic pain: Secondary | ICD-10-CM | POA: Diagnosis not present

## 2015-12-17 DIAGNOSIS — E559 Vitamin D deficiency, unspecified: Secondary | ICD-10-CM | POA: Diagnosis not present

## 2015-12-17 DIAGNOSIS — M545 Low back pain: Secondary | ICD-10-CM

## 2015-12-17 DIAGNOSIS — D509 Iron deficiency anemia, unspecified: Secondary | ICD-10-CM | POA: Diagnosis not present

## 2015-12-17 DIAGNOSIS — E611 Iron deficiency: Secondary | ICD-10-CM

## 2015-12-17 DIAGNOSIS — M25561 Pain in right knee: Secondary | ICD-10-CM

## 2015-12-17 LAB — CBC WITH DIFFERENTIAL (CANCER CENTER ONLY)
BASO#: 0 10*3/uL (ref 0.0–0.2)
BASO%: 0.2 % (ref 0.0–2.0)
EOS%: 1.9 % (ref 0.0–7.0)
Eosinophils Absolute: 0.2 10*3/uL (ref 0.0–0.5)
HCT: 42.6 % (ref 34.8–46.6)
HGB: 14.4 g/dL (ref 11.6–15.9)
LYMPH#: 2.5 10*3/uL (ref 0.9–3.3)
LYMPH%: 24 % (ref 14.0–48.0)
MCH: 29.6 pg (ref 26.0–34.0)
MCHC: 33.8 g/dL (ref 32.0–36.0)
MCV: 88 fL (ref 81–101)
MONO#: 0.5 10*3/uL (ref 0.1–0.9)
MONO%: 4.8 % (ref 0.0–13.0)
NEUT#: 7 10*3/uL — ABNORMAL HIGH (ref 1.5–6.5)
NEUT%: 69.1 % (ref 39.6–80.0)
PLATELETS: 197 10*3/uL (ref 145–400)
RBC: 4.87 10*6/uL (ref 3.70–5.32)
RDW: 13.2 % (ref 11.1–15.7)
WBC: 10.2 10*3/uL — AB (ref 3.9–10.0)

## 2015-12-17 LAB — IRON AND TIBC
%SAT: 17 % — ABNORMAL LOW (ref 21–57)
Iron: 52 ug/dL (ref 41–142)
TIBC: 304 ug/dL (ref 236–444)
UIBC: 252 ug/dL (ref 120–384)

## 2015-12-17 LAB — COMPREHENSIVE METABOLIC PANEL
ALBUMIN: 3.3 g/dL — AB (ref 3.5–5.0)
ALK PHOS: 107 U/L (ref 40–150)
ALT: 18 U/L (ref 0–55)
AST: 12 U/L (ref 5–34)
Anion Gap: 10 mEq/L (ref 3–11)
BILIRUBIN TOTAL: 0.48 mg/dL (ref 0.20–1.20)
BUN: 11.2 mg/dL (ref 7.0–26.0)
CO2: 25 mEq/L (ref 22–29)
CREATININE: 0.9 mg/dL (ref 0.6–1.1)
Calcium: 9.1 mg/dL (ref 8.4–10.4)
Chloride: 106 mEq/L (ref 98–109)
EGFR: 69 mL/min/{1.73_m2} — ABNORMAL LOW (ref >90)
GLUCOSE: 108 mg/dL (ref 70–140)
Potassium: 4 mEq/L (ref 3.5–5.1)
SODIUM: 140 meq/L (ref 136–145)
TOTAL PROTEIN: 7.4 g/dL (ref 6.4–8.3)

## 2015-12-17 LAB — FERRITIN: Ferritin: 177 ng/ml (ref 9–269)

## 2015-12-17 NOTE — Progress Notes (Signed)
Hematology and Oncology Follow Up Visit  [name] [id number] [birth year] 70 y.o. 12/17/2015   Principle Diagnosis:  1. IgM kappa monoclonal gammopathy of undetermined significance 2. Intermittent iron deficiency anemia  Current Therapy:   IV iron as indicated - last received in November 2015     Interim History:  [name] is is here today for a follow-up. She is having a hard time at home and is under a great deal of stress. Her husband is an alcoholic and this is very tough on her. She is a bit teary while talking today. She has the name of a therapist that she plans on seeing. She denies feelings of harming herself of others.  She has had no problem with infections. Her M-spike in September was 0.2 g/dL , IgG level was 675 mg/dL and kappa light chain was 2.17 mg/dL. Levels today are pending.  Her iron studies have been stable and she has not required and infusion since November 2015.  No fever, chills, n/v, cough, rash, dizziness, SOB, chest pain, palpitations, abdominal pain or changes in bowel or bladder habits. She has occasional constipation and uses stool softeners and Miralax as needed for relief.  No lymphadenopathy found on exam.  She has some chronic pain in her right knee and lower back. She uses a cane to ambulate and has had no falls.  No swelling, numbness or tingling in her extremities. No new aches or pains.  She has a good appetite and is staying well hydrated. She does admit to drinking 6 Dr. Samson Frederic a day and plans to cut back and drink more water. Her weight is stable.   Medications:    Medication List       This list is accurate as of: 12/17/15 11:46 AM.  Always use your most recent med list.               ALPRAZolam 0.5 MG tablet  Commonly known as:  XANAX  TAKE ONE-HALF TO ONE TABLET BY MOUTH THREE TIMES DAILY     Azelastine HCl 0.15 % Soln  Commonly known as:  ASTEPRO  2 sprays each nostril every 12 hours     cyclobenzaprine 5 MG tablet    Commonly known as:  FLEXERIL     diphenhydrAMINE 25 MG tablet  Commonly known as:  BENADRYL  Take 25 mg by mouth at bedtime as needed.     fish oil-omega-3 fatty acids 1000 MG capsule  Take 2 g by mouth daily.     fluconazole 150 MG tablet  Commonly known as:  DIFLUCAN  Take 1 tablet (150 mg total) by mouth once.     hydrocortisone 2.5 % cream  Apply 1 application topically as needed. RECTAL ITCHING     Inositol Niacinate 500 MG Caps  Take 1 capsule by mouth at bedtime.     levothyroxine 75 MCG tablet  Commonly known as:  SYNTHROID, LEVOTHROID  TAKE ONE-HALF TABLET BY MOUTH ONCE DAILY     meclizine 25 MG tablet  Commonly known as:  ANTIVERT  Take 1 tablet (25 mg total) by mouth every 6 (six) hours as needed for dizziness.     meloxicam 7.5 MG tablet  Commonly known as:  MOBIC  Take 2 tablets (15 mg total) by mouth daily.     metoprolol tartrate 25 MG tablet  Commonly known as:  LOPRESSOR  Take 1 tablet (25 mg total) by mouth daily.     multivitamin capsule  Take by mouth.  MULTIVITAMIN/IRON PO  Take 1 tablet by mouth daily.     ofloxacin 0.3 % otic solution  Commonly known as:  FLOXIN  Place 10 drops into the right ear daily.     Oxycodone HCl 10 MG Tabs     promethazine 25 MG tablet  Commonly known as:  PHENERGAN  Take 1 tablet (25 mg total) by mouth every 6 (six) hours as needed for nausea or vomiting.     simvastatin 40 MG tablet  Commonly known as:  ZOCOR  Take 1 tablet (40 mg total) by mouth at bedtime.     traMADol 50 MG tablet  Commonly known as:  ULTRAM  Take 50 mg by mouth every 6 (six) hours as needed.     traZODone 50 MG tablet  Commonly known as:  DESYREL     Vitamin D 2000 units Caps  Take 5 capsules by mouth daily.        Allergies:  Allergies  Allergen Reactions  . Cymbalta [Duloxetine Hcl]     Facial edema  . Iohexol      Code: HIVES, Desc: pt states her face and throat swells when administered IV contrast - KB, Onset  Date: [id number]   . Mepergan [Meperidine-Promethazine]     Cardiac arrest  . Augmentin [Amoxicillin-Pot Clavulanate] Nausea Only  . Citalopram Other (See Comments)    Jittery/heart racing   . Hctz [Hydrochlorothiazide] Other (See Comments)    Too much urinating, felt dried out, etc--NO ALLERGY  . Lexapro [Escitalopram Oxalate] Other (See Comments)    Jittery,heart racing  . Lyrica [Pregabalin] Swelling    Face, hands, fingers, ankles  . Valsartan     REACTION: pt states INTOL to Diovan    Past Medical History, Surgical history, Social history, and Family History were reviewed and updated.  Review of Systems: All other 10 point review of systems is negative.   Physical Exam:  height is 5\' 6"  (1.676 m) and weight is 194 lb (87.998 kg). Her oral temperature is 97.9 F (36.6 C). Her blood pressure is 134/81 and her pulse is 67. Her respiration is 16.   Wt Readings from Last 3 Encounters:  12/17/15 194 lb (87.998 kg)  09/10/15 193 lb 8 oz (87.771 kg)  08/27/15 193 lb (87.544 kg)    Ocular: Sclerae unicteric, pupils equal, round and reactive to light Ear-nose-throat: Oropharynx clear, dentition fair Lymphatic: No cervical supraclavicular or axillary adenopathy Lungs no rales or rhonchi, good excursion bilaterally Heart regular rate and rhythm, no murmur appreciated Abd soft, nontender, positive bowel sounds, no liver or spleen tip palpated on exam, no fluid wave MSK no focal spinal tenderness, no joint edema Neuro: non-focal, well-oriented, appropriate affect Breasts: Deferred  Lab Results  Component Value Date   WBC 10.2* 12/17/2015   HGB 14.4 12/17/2015   HCT 42.6 12/17/2015   MCV 88 12/17/2015   PLT 197 12/17/2015   Lab Results  Component Value Date   FERRITIN 145 06/19/2015   IRON 93 06/19/2015   TIBC 291 06/19/2015   UIBC 198 06/19/2015   IRONPCTSAT 32 06/19/2015   Lab Results  Component Value Date   RETICCTPCT 1.2 01/20/2011   RBC 4.87 12/17/2015    RETICCTABS 52.2 01/20/2011   Lab Results  Component Value Date   KPAFRELGTCHN 2.17* 06/19/2015   LAMBDASER 1.94 06/19/2015   KAPLAMBRATIO 1.12 06/19/2015   Lab Results  Component Value Date   IGGSERUM 675* 06/19/2015   IGA 262 06/19/2015   IGMSERUM 514* 06/19/2015  Lab Results  Component Value Date   TOTALPROTELP 6.9 06/19/2015   ALBUMINELP 3.9 06/19/2015   A1GS 0.3 06/19/2015   A2GS 0.9 06/19/2015   BETS 0.4 06/19/2015   BETA2SER 0.5 06/19/2015   GAMS 0.9 06/19/2015   MSPIKE 0.21 08/05/2014   SPEI * 06/19/2015     Chemistry      Component Value Date/Time   NA 142 06/19/2015 1032   NA 140 06/04/2015 1339   NA 145 01/15/2010 0835   K 3.7 06/19/2015 1032   K 3.8 06/04/2015 1339   K 4.2 01/15/2010 0835   CL 105 06/04/2015 1339   CL 103 01/15/2010 0835   CO2 23 06/19/2015 1032   CO2 26 06/04/2015 1339   CO2 30 01/15/2010 0835   BUN 12.3 06/19/2015 1032   BUN 11 06/04/2015 1339   BUN 16 01/15/2010 0835   CREATININE 0.9 06/19/2015 1032   CREATININE 0.83 06/04/2015 1339   CREATININE 0.9 01/15/2010 0835      Component Value Date/Time   CALCIUM 9.2 06/19/2015 1032   CALCIUM 9.2 06/04/2015 1339   CALCIUM 9.9 01/15/2010 0835   ALKPHOS 105 06/19/2015 1032   ALKPHOS 108 06/04/2015 1339   ALKPHOS 109* 01/15/2010 0835   AST 18 06/19/2015 1032   AST 15 06/04/2015 1339   AST 18 01/15/2010 0835   ALT 20 06/19/2015 1032   ALT 15 06/04/2015 1339   ALT 13 01/15/2010 0835   BILITOT 0.72 06/19/2015 1032   BILITOT 0.4 06/04/2015 1339   BILITOT 0.60 01/15/2010 0835                            Impression and Plan: Ms. Landers is 70 yo white female with iron deficiency and MGUS. She is doing fairly well. There is a lot of stress for her at home and she is feeling fatigued. Her M-spike in September was 0.2. Her studies today are pending.  Her iron studies have been stable. We will see what they show and bring her in later this week for an infusion if needed.  We will plan to  see her back in 6 months for labs work and follow-up.  She will contact us with any questions or concerns. We can certainly see her sooner if need be.   Eliezer Bottom, NP 3/29/201711:46 AM

## 2015-12-18 DIAGNOSIS — M4806 Spinal stenosis, lumbar region: Secondary | ICD-10-CM | POA: Diagnosis not present

## 2015-12-18 LAB — KAPPA/LAMBDA LIGHT CHAINS
IG KAPPA FREE LIGHT CHAIN: 14.21 mg/L (ref 3.30–19.40)
IG LAMBDA FREE LIGHT CHAIN: 12.33 mg/L (ref 5.71–26.30)
KAPPA/LAMBDA FLC RATIO: 1.15 (ref 0.26–1.65)

## 2015-12-18 LAB — IGG, IGA, IGM
IGA/IMMUNOGLOBULIN A, SERUM: 255 mg/dL (ref 87–352)
IGM (IMMUNOGLOBIN M), SRM: 592 mg/dL — AB (ref 26–217)
IgG, Qn, Serum: 617 mg/dL — ABNORMAL LOW (ref 700–1600)

## 2015-12-22 LAB — PROTEIN ELECTROPHORESIS, SERUM, WITH REFLEX
A/G RATIO SPE: 0.9 (ref 0.7–1.7)
ALPHA 2: 1.1 g/dL — AB (ref 0.4–1.0)
Albumin: 3.4 g/dL (ref 2.9–4.4)
Alpha 1: 0.3 g/dL (ref 0.0–0.4)
Beta: 1.1 g/dL (ref 0.7–1.3)
GLOBULIN, TOTAL: 3.6 g/dL (ref 2.2–3.9)
Gamma Globulin: 1.1 g/dL (ref 0.4–1.8)
INTERPRETATION(SEE BELOW): 0
M-Spike, %: 0.4 g/dL — ABNORMAL HIGH
TOTAL PROTEIN: 7 g/dL (ref 6.0–8.5)

## 2015-12-23 ENCOUNTER — Ambulatory Visit (HOSPITAL_BASED_OUTPATIENT_CLINIC_OR_DEPARTMENT_OTHER): Payer: Medicare Other

## 2015-12-23 ENCOUNTER — Ambulatory Visit (HOSPITAL_BASED_OUTPATIENT_CLINIC_OR_DEPARTMENT_OTHER)
Admission: RE | Admit: 2015-12-23 | Discharge: 2015-12-23 | Disposition: A | Discharge status: Home / Self Care | Payer: Medicare Other | Source: Ambulatory Visit | Attending: Gynecology | Admitting: Gynecology

## 2015-12-23 ENCOUNTER — Other Ambulatory Visit: Payer: Self-pay | Admitting: Family

## 2015-12-23 VITALS — BP 123/68 | HR 63 | Temp 97.2°F | Resp 16

## 2015-12-23 DIAGNOSIS — Z1231 Encounter for screening mammogram for malignant neoplasm of breast: Secondary | ICD-10-CM | POA: Insufficient documentation

## 2015-12-23 DIAGNOSIS — D509 Iron deficiency anemia, unspecified: Secondary | ICD-10-CM

## 2015-12-23 MED ORDER — SODIUM CHLORIDE 0.9 % IV SOLN
Freq: Once | INTRAVENOUS | Status: AC
  Administered 2015-12-23: 12:00:00 via INTRAVENOUS

## 2015-12-23 MED ORDER — SODIUM CHLORIDE 0.9 % IV SOLN
510.0000 mg | Freq: Once | INTRAVENOUS | Status: AC
  Administered 2015-12-23: 510 mg via INTRAVENOUS
  Filled 2015-12-23: qty 17

## 2015-12-23 NOTE — Patient Instructions (Signed)

## 2015-12-26 ENCOUNTER — Telehealth: Payer: Self-pay | Admitting: Family

## 2015-12-26 NOTE — Telephone Encounter (Signed)
Spoke with [name] and went over her blood work from 12/17/15 with her in detail. Al questions were answered. We will plan to see her at her follow-up in September.

## 2015-12-30 ENCOUNTER — Encounter (HOSPITAL_BASED_OUTPATIENT_CLINIC_OR_DEPARTMENT_OTHER): Payer: Self-pay | Admitting: *Deleted

## 2015-12-30 ENCOUNTER — Emergency Department (HOSPITAL_BASED_OUTPATIENT_CLINIC_OR_DEPARTMENT_OTHER)
Admission: EM | Admit: 2015-12-30 | Discharge: 2015-12-30 | Disposition: A | Discharge status: Home / Self Care | Payer: Medicare Other | Attending: Emergency Medicine | Admitting: Emergency Medicine

## 2015-12-30 DIAGNOSIS — Z79899 Other long term (current) drug therapy: Secondary | ICD-10-CM | POA: Insufficient documentation

## 2015-12-30 DIAGNOSIS — I10 Essential (primary) hypertension: Secondary | ICD-10-CM | POA: Diagnosis not present

## 2015-12-30 DIAGNOSIS — M62830 Muscle spasm of back: Secondary | ICD-10-CM | POA: Diagnosis not present

## 2015-12-30 DIAGNOSIS — K5900 Constipation, unspecified: Secondary | ICD-10-CM | POA: Diagnosis not present

## 2015-12-30 DIAGNOSIS — G894 Chronic pain syndrome: Secondary | ICD-10-CM | POA: Diagnosis not present

## 2015-12-30 DIAGNOSIS — M961 Postlaminectomy syndrome, not elsewhere classified: Secondary | ICD-10-CM | POA: Diagnosis not present

## 2015-12-30 DIAGNOSIS — E039 Hypothyroidism, unspecified: Secondary | ICD-10-CM | POA: Diagnosis not present

## 2015-12-30 DIAGNOSIS — R5381 Other malaise: Secondary | ICD-10-CM | POA: Diagnosis not present

## 2015-12-30 DIAGNOSIS — F333 Major depressive disorder, recurrent, severe with psychotic symptoms: Secondary | ICD-10-CM | POA: Diagnosis not present

## 2015-12-30 DIAGNOSIS — R110 Nausea: Secondary | ICD-10-CM | POA: Diagnosis present

## 2015-12-30 LAB — CBC WITH DIFFERENTIAL/PLATELET
BASOS ABS: 0 10*3/uL (ref 0.0–0.1)
BASOS PCT: 0 %
EOS ABS: 0 10*3/uL (ref 0.0–0.7)
Eosinophils Relative: 1 %
HCT: 41.8 % (ref 36.0–46.0)
HEMOGLOBIN: 13.8 g/dL (ref 12.0–15.0)
LYMPHS PCT: 19 %
Lymphs Abs: 1.3 10*3/uL (ref 0.7–4.0)
MCH: 29 pg (ref 26.0–34.0)
MCHC: 33 g/dL (ref 30.0–36.0)
MCV: 87.8 fL (ref 78.0–100.0)
MONOS PCT: 4 %
Monocytes Absolute: 0.3 10*3/uL (ref 0.1–1.0)
NEUTROS ABS: 5 10*3/uL (ref 1.7–7.7)
NEUTROS PCT: 76 %
Platelets: 218 10*3/uL (ref 150–400)
RBC: 4.76 MIL/uL (ref 3.87–5.11)
RDW: 12.9 % (ref 11.5–15.5)
WBC: 6.7 10*3/uL (ref 4.0–10.5)

## 2015-12-30 LAB — COMPREHENSIVE METABOLIC PANEL
ALBUMIN: 4.1 g/dL (ref 3.5–5.0)
ALT: 27 U/L (ref 14–54)
ANION GAP: 7 (ref 5–15)
AST: 23 U/L (ref 15–41)
Alkaline Phosphatase: 99 U/L (ref 38–126)
BILIRUBIN TOTAL: 0.5 mg/dL (ref 0.3–1.2)
BUN: 7 mg/dL (ref 6–20)
CHLORIDE: 105 mmol/L (ref 101–111)
CO2: 28 mmol/L (ref 22–32)
Calcium: 9.2 mg/dL (ref 8.9–10.3)
Creatinine, Ser: 0.83 mg/dL (ref 0.44–1.00)
GFR calc Af Amer: >60 mL/min (ref >60)
GFR calc non Af Amer: >60 mL/min (ref >60)
GLUCOSE: 118 mg/dL — AB (ref 65–99)
POTASSIUM: 3.6 mmol/L (ref 3.5–5.1)
SODIUM: 140 mmol/L (ref 135–145)
TOTAL PROTEIN: 7.6 g/dL (ref 6.5–8.1)

## 2015-12-30 LAB — LIPASE, BLOOD: Lipase: 15 U/L (ref 11–51)

## 2015-12-30 LAB — TROPONIN I

## 2015-12-30 MED ORDER — ONDANSETRON HCL 4 MG/2ML IJ SOLN
4.0000 mg | Freq: Once | INTRAMUSCULAR | Status: AC
  Administered 2015-12-30: 4 mg via INTRAVENOUS
  Filled 2015-12-30: qty 2

## 2015-12-30 MED ORDER — SODIUM CHLORIDE 0.9 % IV BOLUS (SEPSIS)
1000.0000 mL | Freq: Once | INTRAVENOUS | Status: AC
  Administered 2015-12-30: 1000 mL via INTRAVENOUS

## 2015-12-30 MED ORDER — ONDANSETRON 8 MG PO TBDP: ORAL_TABLET | ORAL | Status: DC

## 2015-12-30 MED ORDER — KETOROLAC TROMETHAMINE 30 MG/ML IJ SOLN
30.0000 mg | Freq: Once | INTRAMUSCULAR | Status: AC
  Administered 2015-12-30: 30 mg via INTRAVENOUS
  Filled 2015-12-30: qty 1

## 2015-12-30 MED ORDER — LORAZEPAM 2 MG/ML IJ SOLN
1.0000 mg | Freq: Once | INTRAMUSCULAR | Status: AC
  Administered 2015-12-30: 1 mg via INTRAVENOUS
  Filled 2015-12-30: qty 1

## 2015-12-30 NOTE — ED Notes (Signed)
MD at bedside. 

## 2015-12-30 NOTE — Discharge Instructions (Signed)
Zofran as prescribed as needed for nausea.  Return to the emergency department if your symptoms significantly worsen or change.   Nausea, Adult Nausea is the feeling that you have an upset stomach or have to vomit. Nausea by itself is not likely a serious concern, but it may be an early sign of more serious medical problems. As nausea gets worse, it can lead to vomiting. If vomiting develops, there is the risk of dehydration.  CAUSES   Viral infections.  Food poisoning.  Medicines.  Pregnancy.  Motion sickness.  Migraine headaches.  Emotional distress.  Severe pain from any source.  Alcohol intoxication. HOME CARE INSTRUCTIONS  Get plenty of rest.  Ask your caregiver about specific rehydration instructions.  Eat small amounts of food and sip liquids more often.  Take all medicines as told by your caregiver. SEEK MEDICAL CARE IF:  You have not improved after 2 days, or you get worse.  You have a headache. SEEK IMMEDIATE MEDICAL CARE IF:   You have a fever.  You faint.  You keep vomiting or have blood in your vomit.  You are extremely weak or dehydrated.  You have dark or bloody stools.  You have severe chest or abdominal pain. MAKE SURE YOU:  Understand these instructions.  Will watch your condition.  Will get help right away if you are not doing well or get worse.   This information is not intended to replace advice given to you by your health care provider. Make sure you discuss any questions you have with your health care provider.   Document Released: 10/14/2004 Document Revised: 09/27/2014 Document Reviewed: 05/19/2011 Elsevier Interactive Patient Education [employer].

## 2015-12-30 NOTE — ED Provider Notes (Signed)
CSN: [id number]     Arrival date & time 12/30/15  1439 History   First MD Initiated Contact with Patient 12/30/15 1656     Chief Complaint  Patient presents with  . Nausea     (Consider location/radiation/quality/duration/timing/severity/associated sxs/prior Treatment) HPI Comments: Patient is a 70 year old [name] with history of fibromyalgia, hypertension, acid reflux. She presents with complaints of "I'm just so sick". She reports nausea, dry mouth and throat, feeling jittery and anxious. She denies any chest pain or shortness of breath. She denies any vomiting or diarrhea.  The history is provided by the patient.    Past Medical History  Diagnosis Date  . Allergic rhinitis   . HTN (hypertension)   . Chest pain     cr  . Palpitations   . Hypercholesterolemia   . GERD (gastroesophageal reflux disease)   . Diverticulosis of colon     with hx of 'itis  . History of colonic polyps   . DJD (degenerative joint disease)     L spine and knees  . Lumbar back pain   . Fibromyalgia   . Osteopenia 04/2014    T score -2.0 FRAX 11%/1.8%  . Vitamin D deficiency   . Anxiety   . Major depression, recurrent ([place])     vs dysthymia,chronic  . Psoriasis   . Monoclonal gammopathy of undetermined significance     IgM kappa MGUS ([name]) routine f/u has shown stability of this problem.  . IC (interstitial cystitis)     [name] at [facility]  . Iron deficiency anemia, unspecified 06/27/2013  . Memory impairment 10/03/2012    Memory impairment 10/03/2012 >referral to neuro    . Hypothyroid 07/05/2012  . FATIGUE / MALAISE 07/01/2009    Chronic  . Chronic renal insufficiency, stage II (mild) 2015    CrCl in th 60s.  . Chronic pain syndrome     right knee osteo, hx of TKA in R knee.  Pt was told by [name] that no further surgical options are available for her--[name]  . IBS (irritable bowel syndrome)   . OSA (obstructive sleep apnea)     does not have CPAP; Dr.  Halford Chessman  . Otitis externa, candida [id number]    [name] tx'd her with clotrimazole 1% external solution x 1 mo   Past Surgical History  Procedure Laterality Date  . Laminotomy  2010    L-4, L-5 FUSION  . Knee arthroscopy      RIGHT KNEE X 5  . Total knee arthroplasty  12-07 and 4-07    [name], revision 12-09 [name] at [facility]  . Patella fracture surgery  10-08    [name]  . Posterior laminectomy / decompression lumbar spine  9-09    [name]  . Hand surgery Right     TRIGGER FINGER; index finger  . Abdominal hysterectomy      for prolapse with abdominal incision  . Oophorectomy  2010    Laparoscopic BSO (path benign)  . Esophagogastroduodenoscopy  05/29/2009    Esoph normal.  Gastritis (H pylori NEG) + gastric polyps (benign).  Duodenal biopsy NORMAL.  [name] Colonoscopy  04/2006    Diverticulosis, o/w normal.  . Nasal sinus surgery      polypectomy (remote past)  . Hernia repair      umbilical  . Cholecystectomy    . Cardiac catheterization      no PCI  . Eye surgery Left     "  lazy eye"  . Back surgery  [id number]    L2-3 fusion ([name])   Family History  Problem Relation Age of Onset  . Hypertension Mother   . Hypertension Father   . Breast cancer Sister     Age 45's  . Hypertension Sister   . Leukemia Brother   . Lung disease Brother   . Cancer Brother     Melanoma  . Coronary artery disease Sister   . Lupus Sister    Social History  Substance Use Topics  . Smoking status: Never Smoker   . Smokeless tobacco: Never Used     Comment: NEVER USED TOBACCO  . Alcohol Use: No   OB History    Gravida Para Term Preterm AB TAB SAB Ectopic Multiple Living   0              Review of Systems  All other systems reviewed and are negative.     Allergies  Cymbalta; Iohexol; Mepergan; Augmentin; Citalopram; Hctz; Lexapro; Lyrica; and Valsartan  Home Medications   Prior to Admission medications   Medication Sig Start Date End Date Taking? Authorizing Provider   ALPRAZolam (XANAX) 0.5 MG tablet TAKE ONE-HALF TO ONE TABLET BY MOUTH THREE TIMES DAILY 11/03/15   [name], MD  Azelastine HCl (ASTEPRO) 0.15 % SOLN 2 sprays each nostril every 12 hours Patient taking differently: Place 2 sprays into the nose 2 (two) times daily as needed (Allergies). 2 sprays each nostril every 12 hours 07/19/14   [name], MD  Cholecalciferol (VITAMIN D) 2000 UNITS CAPS Take 5 capsules by mouth daily.     Historical Provider, MD  cyclobenzaprine (FLEXERIL) 5 MG tablet  05/09/15   Historical Provider, MD  diphenhydrAMINE (BENADRYL) 25 MG tablet Take 25 mg by mouth at bedtime as needed.    Historical Provider, MD  fish oil-omega-3 fatty acids 1000 MG capsule Take 2 g by mouth daily.     Historical Provider, MD  fluconazole (DIFLUCAN) 150 MG tablet Take 1 tablet (150 mg total) by mouth once. Patient not taking: Reported on 12/17/2015 10/02/15   Anastasio Auerbach, MD  hydrocortisone 2.5 % cream Apply 1 application topically as needed. RECTAL ITCHING 09/08/12   Historical Provider, MD  Inositol Niacinate 500 MG CAPS Take 1 capsule by mouth at bedtime.    Historical Provider, MD  levothyroxine (SYNTHROID, LEVOTHROID) Lisa MCG tablet TAKE ONE-HALF TABLET BY MOUTH ONCE DAILY 06/11/15   [name], MD  meclizine (ANTIVERT) 25 MG tablet Take 1 tablet (25 mg total) by mouth every 6 (six) hours as needed for dizziness. 11/17/15   [name], MD  meloxicam (MOBIC) 7.5 MG tablet Take 2 tablets (15 mg total) by mouth daily. 09/02/14   [name], MD  metoprolol tartrate (LOPRESSOR) 25 MG tablet Take 1 tablet (25 mg total) by mouth daily. 07/31/15   [name], MD  Multiple Vitamin (MULTIVITAMIN) capsule Take by mouth.    Historical Provider, MD  Multiple Vitamins-Iron (MULTIVITAMIN/IRON PO) Take 1 tablet by mouth daily.     Historical Provider, MD  ofloxacin (FLOXIN) 0.3 % otic solution Place 10 drops into the right ear daily. 07/31/15   [name], MD   Oxycodone HCl 10 MG TABS  12/01/15   Historical Provider, MD  promethazine (PHENERGAN) 25 MG tablet Take 1 tablet (25 mg total) by mouth every 6 (six) hours as needed for nausea or vomiting. 09/10/15   [name], MD  simvastatin (ZOCOR) 40 MG tablet Take 1 tablet (40 mg total) by mouth at bedtime. 09/10/15   [name], MD  traMADol (ULTRAM) 50 MG tablet Take 50 mg by mouth every 6 (six) hours as needed.    Historical Provider, MD  traZODone (DESYREL) 50 MG tablet  06/06/15   Historical Provider, MD   BP 139/77 mmHg  Pulse 80  Temp(Src) 98 F (36.7 C) (Oral)  Resp 20  Ht 5' 6.5" (1.689 m)  Wt 194 lb (87.998 kg)  BMI 30.85 kg/m2  SpO2 97% Physical Exam  Constitutional: She is oriented to person, place, and time. She appears well-developed and well-nourished. No distress.  HENT:  Head: Normocephalic and atraumatic.  Mouth/Throat: Oropharynx is clear and moist.  Neck: Normal range of motion. Neck supple.  Cardiovascular: Normal rate and regular rhythm.  Exam reveals no gallop and no friction rub.   No murmur heard. Pulmonary/Chest: Effort normal and breath sounds normal. No respiratory distress. She has no wheezes.  Abdominal: Soft. Bowel sounds are normal. She exhibits no distension. There is no tenderness.  Musculoskeletal: Normal range of motion.  Neurological: She is alert and oriented to person, place, and time.  Skin: Skin is warm and dry. She is not diaphoretic.  Nursing note and vitals reviewed.   ED Course  Procedures (including critical care time) Labs Review Labs Reviewed  COMPREHENSIVE METABOLIC PANEL  LIPASE, BLOOD  CBC WITH DIFFERENTIAL/PLATELET  TROPONIN I    Imaging Review No results found. I have personally reviewed and evaluated these images and lab results as part of my medical decision-making.  ED ECG REPORT   Date: 12/30/2015  Rate: 80  Rhythm: normal sinus rhythm  QRS Axis: normal  Intervals: normal  ST/T Wave abnormalities: normal   Conduction Disutrbances:none  Narrative Interpretation:   Old EKG Reviewed: none available  I have personally reviewed the EKG tracing and agree with the computerized printout as noted.   MDM   Final diagnoses:  None    Patient presents with complaints of nausea and generalized malaise. She reports "I feel sick", but has difficulty elaborating further. Her laboratory studies reveal normal electrolytes and LFTs, normal lipase, and a normal CBC. Her EKG is normal and troponin is negative. I suspect some sort of viral illness as I am unable to come up with an alternate explanation. She is feeling better after Zofran, fluids, and Ativan and will be discharged to home.    Veryl Speak, MD 12/30/15 [id number]

## 2015-12-30 NOTE — ED Notes (Signed)
C/o nausea, trembling inside. Her tongue hurts. States I just feel sick. No specific complaint. She had an iron infusion last week.

## 2016-01-27 DIAGNOSIS — G894 Chronic pain syndrome: Secondary | ICD-10-CM | POA: Diagnosis not present

## 2016-01-27 DIAGNOSIS — K5900 Constipation, unspecified: Secondary | ICD-10-CM | POA: Diagnosis not present

## 2016-01-27 DIAGNOSIS — M961 Postlaminectomy syndrome, not elsewhere classified: Secondary | ICD-10-CM | POA: Diagnosis not present

## 2016-01-27 DIAGNOSIS — M62830 Muscle spasm of back: Secondary | ICD-10-CM | POA: Diagnosis not present

## 2016-01-28 ENCOUNTER — Telehealth: Payer: Self-pay

## 2016-01-28 DIAGNOSIS — E039 Hypothyroidism, unspecified: Secondary | ICD-10-CM

## 2016-01-28 NOTE — Telephone Encounter (Signed)
Referral to Dr. Loanne Drilling ordered as per pt's request.

## 2016-01-28 NOTE — Telephone Encounter (Signed)
Patient left vmail requesting to have Dr. [name] refer her to Dr. Loanne Drilling (endocrinology). Spoke to patient to clarify. Patient states her spouse goes to Dr. Loanne Drilling and he told her to just have PCP put in a referral to see him for her thyroid problems. Advised patient would fwd to Dr. [name]. Patient agreed.

## 2016-01-28 NOTE — Telephone Encounter (Signed)
Called patient and informed. Patient was grateful.

## 2016-02-05 DIAGNOSIS — S92345A Nondisplaced fracture of fourth metatarsal bone, left foot, initial encounter for closed fracture: Secondary | ICD-10-CM | POA: Diagnosis not present

## 2016-02-17 DIAGNOSIS — R070 Pain in throat: Secondary | ICD-10-CM | POA: Diagnosis not present

## 2016-02-17 DIAGNOSIS — R682 Dry mouth, unspecified: Secondary | ICD-10-CM | POA: Diagnosis not present

## 2016-02-17 DIAGNOSIS — R490 Dysphonia: Secondary | ICD-10-CM | POA: Insufficient documentation

## 2016-02-17 DIAGNOSIS — H9201 Otalgia, right ear: Secondary | ICD-10-CM | POA: Diagnosis not present

## 2016-02-19 ENCOUNTER — Encounter: Payer: Self-pay | Admitting: Family Medicine

## 2016-02-19 DIAGNOSIS — S92345D Nondisplaced fracture of fourth metatarsal bone, left foot, subsequent encounter for fracture with routine healing: Secondary | ICD-10-CM | POA: Diagnosis not present

## 2016-02-23 DIAGNOSIS — M7061 Trochanteric bursitis, right hip: Secondary | ICD-10-CM | POA: Diagnosis not present

## 2016-02-24 ENCOUNTER — Ambulatory Visit (INDEPENDENT_AMBULATORY_CARE_PROVIDER_SITE_OTHER): Payer: Medicare Other | Admitting: Endocrinology

## 2016-02-24 ENCOUNTER — Encounter: Payer: Self-pay | Admitting: Endocrinology

## 2016-02-24 VITALS — BP 132/80 | HR 69 | Temp 97.5°F | Ht 66.0 in | Wt 192.0 lb

## 2016-02-24 DIAGNOSIS — E039 Hypothyroidism, unspecified: Secondary | ICD-10-CM

## 2016-02-24 DIAGNOSIS — M609 Myositis, unspecified: Secondary | ICD-10-CM

## 2016-02-24 DIAGNOSIS — R682 Dry mouth, unspecified: Secondary | ICD-10-CM | POA: Diagnosis not present

## 2016-02-24 DIAGNOSIS — M2550 Pain in unspecified joint: Secondary | ICD-10-CM | POA: Diagnosis not present

## 2016-02-24 DIAGNOSIS — IMO0001 Reserved for inherently not codable concepts without codable children: Secondary | ICD-10-CM | POA: Insufficient documentation

## 2016-02-24 DIAGNOSIS — M791 Myalgia: Secondary | ICD-10-CM

## 2016-02-24 LAB — SEDIMENTATION RATE: SED RATE: 37 mm/h — AB (ref 0–30)

## 2016-02-24 LAB — T4, FREE: FREE T4: 0.77 ng/dL (ref 0.60–1.60)

## 2016-02-24 LAB — RHEUMATOID FACTOR: Rhuematoid fact SerPl-aCnc: <10 IU/mL (ref <=14)

## 2016-02-24 LAB — T3, FREE: T3, Free: 3.1 pg/mL (ref 2.3–4.2)

## 2016-02-24 LAB — TSH: TSH: 0.97 u[IU]/mL (ref 0.35–4.50)

## 2016-02-24 NOTE — Progress Notes (Signed)
Subjective:    Patient ID: [name], female    DOB: Jul 26, 1946, 70 y.o.   MRN: [id number]  HPI Pt reports hypothyroidism was dx'ed in 2010.  she has been on prescribed thyroid hormone therapy since then.  she has never taken kelp or any other type of non-prescribed thyroid product.  she has never had thyroid surgery, or XRT to the neck.  she has never been on amiodarone or lithium.  She reports slight tremor of the hands, and assoc anxiety.  She has been on synthroid 37.5 mcg/d, x approx 3 years.  He recently saw Dr Erik Obey, who requested several labs.    Past Medical History  Diagnosis Date  . Allergic rhinitis   . HTN (hypertension)   . Chest pain     cr  . Palpitations   . Hypercholesterolemia   . GERD (gastroesophageal reflux disease)   . Diverticulosis of colon     with hx of 'itis  . History of colonic polyps   . DJD (degenerative joint disease)     L spine and knees  . Lumbar back pain   . Fibromyalgia   . Osteopenia 04/2014    T score -2.0 FRAX 11%/1.8%  . Vitamin D deficiency   . Anxiety   . Major depression, recurrent ([place])     vs dysthymia,chronic  . Psoriasis   . Monoclonal gammopathy of undetermined significance     IgM kappa MGUS ([name]) routine f/u has shown stability of this problem.  . IC (interstitial cystitis)     [name] at [facility]  . Iron deficiency anemia, unspecified 06/27/2013  . Memory impairment 10/03/2012    Memory impairment 10/03/2012 >referral to neuro    . Hypothyroid 07/05/2012  . FATIGUE / MALAISE 07/01/2009    Chronic  . Chronic renal insufficiency, stage II (mild) 2015    CrCl in th 60s.  . Chronic pain syndrome     right knee osteo, hx of TKA in R knee.  Pt was told by [name] that no further surgical options are available for her--[name]  . IBS (irritable bowel syndrome)   . OSA (obstructive sleep apnea)     does not have CPAP; Dr. Halford Chessman  . Otitis externa, candida 96/2836    [name] tx'd  her with clotrimazole 1% external solution x 1 mo  . Xerostomia     and xerophthalmia--per [name] (he suggests SSA, SSB, ESR, rh factor, ANA, and antimicrosmal and antithyroglobulin ab's as of 02/17/16)    Past Surgical History  Procedure Laterality Date  . Laminotomy  2010    L-4, L-5 FUSION  . Knee arthroscopy      RIGHT KNEE X 5  . Total knee arthroplasty  12-07 and 4-07    [name], revision 12-09 [name] at [facility]  . Patella fracture surgery  10-08    [name]  . Posterior laminectomy / decompression lumbar spine  9-09    [name]  . Hand surgery Right     TRIGGER FINGER; index finger  . Abdominal hysterectomy      for prolapse with abdominal incision  . Oophorectomy  2010    Laparoscopic BSO (path benign)  . Esophagogastroduodenoscopy  05/29/2009    Esoph normal.  Gastritis (H pylori NEG) + gastric polyps (benign).  Duodenal biopsy NORMAL.  [name] Colonoscopy  04/2006    Diverticulosis, o/w normal.  . Nasal sinus surgery      polypectomy (remote  past)  . Hernia repair      umbilical  . Cholecystectomy    . Cardiac catheterization      no PCI  . Eye surgery Left     "lazy eye"  . Back surgery  [id number]    L2-3 fusion ([name])    Social History   Social History  . Marital Status: Married    Spouse Name: Chyenne Sobczak  . Number of Children: 0  . Years of Education: N/A   Occupational History  .     Social History Main Topics  . Smoking status: Never Smoker   . Smokeless tobacco: Never Used     Comment: NEVER USED TOBACCO  . Alcohol Use: No  . Drug Use: No  . Sexual Activity: Not Currently    Birth Control/ Protection: Surgical     Comment: 1st intercourse 70 yo-Fewer than 5 partners   Other Topics Concern  . Not on file   Social History Narrative   Married, no children.   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   No Tob.  No alc.  No drugs.   Orig from [place].    Current Outpatient Prescriptions on File Prior to Visit  Medication  Sig Dispense Refill  . ALPRAZolam (XANAX) 0.5 MG tablet TAKE ONE-HALF TO ONE TABLET BY MOUTH THREE TIMES DAILY 90 tablet 5  . Azelastine HCl (ASTEPRO) 0.15 % SOLN 2 sprays each nostril every 12 hours (Patient taking differently: Place 2 sprays into the nose 2 (two) times daily as needed (Allergies). 2 sprays each nostril every 12 hours) 30 mL 11  . Cholecalciferol (VITAMIN D) 2000 UNITS CAPS Take 5 capsules by mouth daily.     . cyclobenzaprine (FLEXERIL) 5 MG tablet     . diphenhydrAMINE (BENADRYL) 25 MG tablet Take 25 mg by mouth at bedtime as needed.    . fish oil-omega-3 fatty acids 1000 MG capsule Take 2 g by mouth daily.     . fluconazole (DIFLUCAN) 150 MG tablet Take 1 tablet (150 mg total) by mouth once. (Patient taking differently: Take 150 mg by mouth as needed. ) 1 tablet 0  . hydrocortisone 2.5 % cream Apply 1 application topically as needed. RECTAL ITCHING    . Inositol Niacinate 500 MG CAPS Take 1 capsule by mouth at bedtime.    [name] levothyroxine (SYNTHROID, LEVOTHROID) 75 MCG tablet TAKE ONE-HALF TABLET BY MOUTH ONCE DAILY 45 tablet 11  . meclizine (ANTIVERT) 25 MG tablet Take 1 tablet (25 mg total) by mouth every 6 (six) hours as needed for dizziness. 360 tablet 3  . meloxicam (MOBIC) 7.5 MG tablet Take 2 tablets (15 mg total) by mouth daily. 180 tablet 1  . metoprolol tartrate (LOPRESSOR) 25 MG tablet Take 1 tablet (25 mg total) by mouth daily. 90 tablet 3  . Multiple Vitamin (MULTIVITAMIN) capsule Take by mouth.    . Multiple Vitamins-Iron (MULTIVITAMIN/IRON PO) Take 1 tablet by mouth daily.     [name] ofloxacin (FLOXIN) 0.3 % otic solution Place 10 drops into the right ear daily. (Patient taking differently: Place 10 drops into the right ear as needed. ) 5 mL 0  . ondansetron (ZOFRAN ODT) 8 MG disintegrating tablet '8mg'$  ODT q4 hours prn nausea (Patient taking differently: PRN) 6 tablet 0  . Oxycodone HCl 10 MG TABS 1 every 6 hours for pain    . promethazine (PHENERGAN) 25 MG tablet Take  1 tablet (25 mg total) by mouth every 6 (six) hours as needed for  nausea or vomiting. 90 tablet 3  . simvastatin (ZOCOR) 40 MG tablet Take 1 tablet (40 mg total) by mouth at bedtime. 90 tablet 3  . traMADol (ULTRAM) 50 MG tablet Take 50 mg by mouth every 6 (six) hours as needed.    . traZODone (DESYREL) 50 MG tablet      No current facility-administered medications on file prior to visit.    Allergies  Allergen Reactions  . Cymbalta [Duloxetine Hcl]     Facial edema  . Iohexol      Code: HIVES, Desc: pt states her face and throat swells when administered IV contrast - KB, Onset Date: [id number]   . Mepergan [Meperidine-Promethazine]     Cardiac arrest  . Augmentin [Amoxicillin-Pot Clavulanate] Nausea Only  . Citalopram Other (See Comments)    Jittery/heart racing   . Hctz [Hydrochlorothiazide] Other (See Comments)    Too much urinating, felt dried out, etc--NO ALLERGY  . Lexapro [Escitalopram Oxalate] Other (See Comments)    Jittery,heart racing  . Lyrica [Pregabalin] Swelling    Face, hands, fingers, ankles  . Valsartan     REACTION: pt states INTOL to Diovan    Family History  Problem Relation Age of Onset  . Hypertension Mother   . Hypertension Father   . Breast cancer Sister     Age 40's  . Hypertension Sister   . Leukemia Brother   . Lung disease Brother   . Cancer Brother     Melanoma  . Coronary artery disease Sister   . Lupus Sister   . Thyroid disease Mother   . Thyroid disease Sister   . Thyroid disease Brother     BP 132/80 mmHg  Pulse 69  Temp(Src) 97.5 F (36.4 C) (Oral)  Ht 5\' 6"  (1.676 m)  Wt 192 lb (87.091 kg)  BMI 31.00 kg/m2  SpO2 93%  Review of Systems denies sob, numbness, cold intolerance, rhinorrhea, and syncope.  She has dry mouth, arthralgias, hair loss, fatigue, depression, dry eyes, hoarseness, constipation, myalgias, easy bruising, and excessive diaphoresis.      Objective:   Physical Exam VS: see vs page GEN: no distress HEAD:  head: no deformity eyes: no periorbital swelling, no proptosis external nose and ears are normal mouth: no lesion seen NECK: supple, thyroid is not enlarged CHEST WALL: no deformity LUNGS: clear to auscultation CV: reg rate and rhythm, no murmur ABD: abdomen is soft, nontender.  no hepatosplenomegaly.  not distended.  no hernia.  MUSCULOSKELETAL: muscle bulk and strength are grossly normal.  no obvious joint swelling.  gait is steady with a cane EXTEMITIES: no deformity.  no edema PULSES: no carotid bruit.  DP pulses are intact bilaterally. NEURO:  cn 2-12 grossly intact.   readily moves all 4's.  sensation is intact to touch on all 4's.   Slight tremor of the hands. SKIN:  Normal texture and temperature.  No rash or suspicious lesion is visible.   NODES:  None palpable at the neck PSYCH: alert, well-oriented.  Does not appear anxious nor depressed.    Thyroid US: normal.    Lab Results  Component Value Date   TSH 0.97 02/24/2016   T3TOTAL 117.5 06/04/2015    i personally reviewed electrocardiogram tracing (12/30/15): Indication: nausea Impression: LVH    Assessment & Plan:  Hypothyroidism: new to me: well-replaced.   Tremor and other sxs: not thyroid-related.  I have requested records from ENT.    Patient is advised the following: Patient Instructions  blood  tests are requested for you today.  We'll let you know about the results.   Renato Shin, MD

## 2016-02-24 NOTE — Patient Instructions (Signed)
blood tests are requested for you today.  We'll let you know about the results.  

## 2016-02-25 LAB — SJOGRENS SYNDROME-B EXTRACTABLE NUCLEAR ANTIBODY: SSB (LA) (ENA) ANTIBODY, IGG: NEGATIVE

## 2016-02-25 LAB — SJOGRENS SYNDROME-A EXTRACTABLE NUCLEAR ANTIBODY: SSA (RO) (ENA) ANTIBODY, IGG: NEGATIVE

## 2016-02-25 LAB — THYROID PEROXIDASE ANTIBODY: Thyroperoxidase Ab SerPl-aCnc: <1 IU/mL (ref <9)

## 2016-02-26 DIAGNOSIS — M961 Postlaminectomy syndrome, not elsewhere classified: Secondary | ICD-10-CM | POA: Diagnosis not present

## 2016-02-26 DIAGNOSIS — G894 Chronic pain syndrome: Secondary | ICD-10-CM | POA: Diagnosis not present

## 2016-02-26 DIAGNOSIS — K5900 Constipation, unspecified: Secondary | ICD-10-CM | POA: Diagnosis not present

## 2016-02-26 DIAGNOSIS — M62830 Muscle spasm of back: Secondary | ICD-10-CM | POA: Diagnosis not present

## 2016-02-26 LAB — ANA: ANA: NEGATIVE

## 2016-03-01 ENCOUNTER — Ambulatory Visit (INDEPENDENT_AMBULATORY_CARE_PROVIDER_SITE_OTHER): Payer: Medicare Other | Admitting: Family Medicine

## 2016-03-01 ENCOUNTER — Encounter: Payer: Self-pay | Admitting: Family Medicine

## 2016-03-01 VITALS — BP 124/91 | HR 73 | Temp 97.9°F | Resp 20 | Wt 188.2 lb

## 2016-03-01 DIAGNOSIS — J029 Acute pharyngitis, unspecified: Secondary | ICD-10-CM | POA: Insufficient documentation

## 2016-03-01 DIAGNOSIS — R682 Dry mouth, unspecified: Secondary | ICD-10-CM | POA: Diagnosis not present

## 2016-03-01 MED ORDER — CHLORHEXIDINE GLUCONATE 0.12 % MT SOLN: 15.0000 mL | Freq: Two times a day (BID) | OROMUCOSAL | Status: DC

## 2016-03-01 MED ORDER — FLUCONAZOLE 150 MG PO TABS: ORAL_TABLET | ORAL | Status: DC

## 2016-03-01 MED ORDER — FLUCONAZOLE 150 MG PO TABS: 150.0000 mg | ORAL_TABLET | Freq: Once | ORAL | Status: DC

## 2016-03-01 NOTE — Progress Notes (Signed)
Patient ID: [name], female   DOB: 1945-12-14, 70 y.o.   MRN: [id number]    [name] , December 10, 1945, 70 y.o., female MRN: [id number]  CC: Tongue discomfort Subjective: Pt presents for an acute OV with complaints of tongue discomfort of few days duration. Patient states she's had chronic right ear discomfort and recent referral appointment with ENT. Per patient ENT stated that she had a fungal infection in her ear and gave her ear drops. Patient states that they looked at her throat and mouth, and so did her endocrinologist. Her ENT stated that she probably has fungus creating her sinus problems. They also completed antibodies for Sjogren's secondary to her dry mouth, which were negative. Patient states that yesterday her throat became more sore again, and she was unable to get into ENT. He feels the right side sometimes swells in her neck, and she has noticed "white dots" on her throat and tongue. Patient denies fevers, chills, nausea, vomit, unintentional weight loss. She does have a history of GERD, denies heartburn. Patient has a history of MGUS and is followed routinely by oncology, no medications needed at this time. Patient is on chronic narcotics for knee pain.   Allergies  Allergen Reactions  . Cymbalta [Duloxetine Hcl]     Facial edema  . Iohexol      Code: HIVES, Desc: pt states her face and throat swells when administered IV contrast - KB, Onset Date: [id number]   . Mepergan [Meperidine-Promethazine]     Cardiac arrest  . Augmentin [Amoxicillin-Pot Clavulanate] Nausea Only  . Citalopram Other (See Comments)    Jittery/heart racing   . Hctz [Hydrochlorothiazide] Other (See Comments)    Too much urinating, felt dried out, etc--NO ALLERGY  . Lexapro [Escitalopram Oxalate] Other (See Comments)    Jittery,heart racing  . Lyrica [Pregabalin] Swelling    Face, hands, fingers, ankles  . Valsartan     REACTION: pt states INTOL to Diovan   Social History  Substance Use  Topics  . Smoking status: Never Smoker   . Smokeless tobacco: Never Used     Comment: NEVER USED TOBACCO  . Alcohol Use: No   Past Medical History  Diagnosis Date  . Allergic rhinitis   . HTN (hypertension)   . Chest pain     cr  . Palpitations   . Hypercholesterolemia   . GERD (gastroesophageal reflux disease)   . Diverticulosis of colon     with hx of 'itis  . History of colonic polyps   . DJD (degenerative joint disease)     L spine and knees  . Lumbar back pain   . Fibromyalgia   . Osteopenia 04/2014    T score -2.0 FRAX 11%/1.8%  . Vitamin D deficiency   . Anxiety   . Major depression, recurrent ([place])     vs dysthymia,chronic  . Psoriasis   . Monoclonal gammopathy of undetermined significance     IgM kappa MGUS ([name]) routine f/u has shown stability of this problem.  . IC (interstitial cystitis)     [name] at [place]  . Iron deficiency anemia, unspecified 06/27/2013  . Memory impairment 10/03/2012    Memory impairment 10/03/2012 >referral to neuro    . Hypothyroid 07/05/2012  . FATIGUE / MALAISE 07/01/2009    Chronic  . Chronic renal insufficiency, stage II (mild) 2015    CrCl in th 60s.  . Chronic pain syndrome     right knee osteo, hx of TKA in  R knee.  Pt was told by [name] that no further surgical options are available for her--[name]  . IBS (irritable bowel syndrome)   . OSA (obstructive sleep apnea)     does not have CPAP; Dr. Halford Chessman  . Otitis externa, candida 30/0762    [name] tx'd her with clotrimazole 1% external solution x 1 mo  . Xerostomia     and xerophthalmia--per [name] (he suggests SSA, SSB, ESR, rh factor, ANA, and antimicrosmal and antithyroglobulin ab's as of 02/17/16)   Past Surgical History  Procedure Laterality Date  . Laminotomy  2010    L-4, L-5 FUSION  . Knee arthroscopy      RIGHT KNEE X 5  . Total knee arthroplasty  12-07 and 4-07    [name], revision 12-09 [name] at [facility]  .  Patella fracture surgery  10-08    [name]  . Posterior laminectomy / decompression lumbar spine  9-09    [name]  . Hand surgery Right     TRIGGER FINGER; index finger  . Abdominal hysterectomy      for prolapse with abdominal incision  . Oophorectomy  2010    Laparoscopic BSO (path benign)  . Esophagogastroduodenoscopy  05/29/2009    Esoph normal.  Gastritis (H pylori NEG) + gastric polyps (benign).  Duodenal biopsy NORMAL.  [name] Colonoscopy  04/2006    Diverticulosis, o/w normal.  . Nasal sinus surgery      polypectomy (remote past)  . Hernia repair      umbilical  . Cholecystectomy    . Cardiac catheterization      no PCI  . Eye surgery Left     "lazy eye"  . Back surgery  [id number]    L2-3 fusion ([name])   Family History  Problem Relation Age of Onset  . Hypertension Mother   . Hypertension Father   . Breast cancer Sister     Age 27's  . Hypertension Sister   . Leukemia Brother   . Lung disease Brother   . Cancer Brother     Melanoma  . Coronary artery disease Sister   . Lupus Sister   . Thyroid disease Mother   . Thyroid disease Sister   . Thyroid disease Brother      Medication List       This list is accurate as of: 03/01/16  1:11 PM.  Always use your most recent med list.               ALPRAZolam 0.5 MG tablet  Commonly known as:  XANAX  TAKE ONE-HALF TO ONE TABLET BY MOUTH THREE TIMES DAILY     Azelastine HCl 0.15 % Soln  Commonly known as:  ASTEPRO  2 sprays each nostril every 12 hours     cyclobenzaprine 5 MG tablet  Commonly known as:  FLEXERIL     diphenhydrAMINE 25 MG tablet  Commonly known as:  BENADRYL  Take 25 mg by mouth at bedtime as needed.     fish oil-omega-3 fatty acids 1000 MG capsule  Take 2 g by mouth daily.     fluconazole 150 MG tablet  Commonly known as:  DIFLUCAN  Take 1 tablet (150 mg total) by mouth once.     hydrocortisone 2.5 % cream  Apply 1 application topically as needed. RECTAL ITCHING     Inositol  Niacinate 500 MG Caps  Take 1 capsule by mouth at bedtime.  levothyroxine 75 MCG tablet  Commonly known as:  SYNTHROID, LEVOTHROID  TAKE ONE-HALF TABLET BY MOUTH ONCE DAILY     meclizine 25 MG tablet  Commonly known as:  ANTIVERT  Take 1 tablet (25 mg total) by mouth every 6 (six) hours as needed for dizziness.     meloxicam 7.5 MG tablet  Commonly known as:  MOBIC  Take 2 tablets (15 mg total) by mouth daily.     metoprolol tartrate 25 MG tablet  Commonly known as:  LOPRESSOR  Take 1 tablet (25 mg total) by mouth daily.     multivitamin capsule  Take by mouth.     MULTIVITAMIN/IRON PO  Take 1 tablet by mouth daily.     ofloxacin 0.3 % otic solution  Commonly known as:  FLOXIN  Place 10 drops into the right ear daily.     ondansetron 8 MG disintegrating tablet  Commonly known as:  ZOFRAN ODT  '8mg'$  ODT q4 hours prn nausea     Oxycodone HCl 10 MG Tabs  1 every 6 hours for pain     promethazine 25 MG tablet  Commonly known as:  PHENERGAN  Take 1 tablet (25 mg total) by mouth every 6 (six) hours as needed for nausea or vomiting.     simvastatin 40 MG tablet  Commonly known as:  ZOCOR  Take 1 tablet (40 mg total) by mouth at bedtime.     traMADol 50 MG tablet  Commonly known as:  ULTRAM  Take 50 mg by mouth every 6 (six) hours as needed.     traZODone 50 MG tablet  Commonly known as:  DESYREL     Vitamin D 2000 units Caps  Take 5 capsules by mouth daily.       ROS: Negative, with the exception of above mentioned in HPI Objective:  BP 124/91 mmHg  Pulse 73  Temp(Src) 97.9 F (36.6 C) (Oral)  Resp 20  Wt 188 lb 4 oz (85.39 kg)  SpO2 93% Body mass index is 30.4 kg/(m^2). Gen: Afebrile. No acute distress. Nontoxic in appearance. Pleasant Caucasian female. HENT: AT. [place]. Bilateral TM visualized and normal in appearance. External auditory meatus dry/scaly. Dry tacky mucous membranes. No oral lesions. No exudates. Geographical tongue. Bilateral nares no  erythema or swelling. Throat with mild erythema. No exudates. Appears very dry posterior pharynx. Eyes:Pupils Equal Round Reactive to light, Extraocular movements intact,  Conjunctiva without redness, discharge or icterus. Neck/lymp/endocrine: Supple, no lymphadenopathy CV: RRR Chest: CTAB, no wheeze or crackles. Good air movement, normal resp effort.  Skin: No rashes, purpura or petechiae.   Assessment/Plan: AUBRIEGH MINCH is a 70 y.o. female present for acute OV for  pharyngitis, unspecified etiology/Dry mouth  - fluconazole (DIFLUCAN) 150 MG tablet; Take one tab, then repeat 3 days.  Dispense: 2 tablet; Refill: 0 - Dry mouth likely from narcotic use. Encouraged to drink plenty of fluids. Sjogren's work up negative.  - Fasting glucose always around 100 or lower, likely not DM. Patient checks at home. - Does not seem to have changed since ENT appt.  - Discussed possible yeast vs GERD vs narcotic SE.  - Unknown etiology, does not appear infectious.  - Chlorhexidine swish - Follow-up. When necessary  > 25 minutes spent with patient, >50% of time spent face to face counseling patient and coordinating care.  electronically signed by:  Howard Pouch, DO  [place]

## 2016-03-01 NOTE — Patient Instructions (Signed)
Dry mouth may be from pain medications. Drink plenty of water.  Will attempt to treat with diflucan incase of mild yeast component.

## 2016-03-02 ENCOUNTER — Telehealth: Payer: Self-pay | Admitting: Family Medicine

## 2016-03-02 NOTE — Telephone Encounter (Signed)
Please call pt: - I wanted to try a mouthwash with her to help with bacteria load/dry mouth. I have called this in for her to use twice a day.

## 2016-03-02 NOTE — Telephone Encounter (Signed)
Spoke with patient reviewed information. Patient verbalized understanding 

## 2016-03-19 ENCOUNTER — Encounter: Payer: Self-pay | Admitting: Gastroenterology

## 2016-04-22 DIAGNOSIS — K5900 Constipation, unspecified: Secondary | ICD-10-CM | POA: Diagnosis not present

## 2016-04-22 DIAGNOSIS — G894 Chronic pain syndrome: Secondary | ICD-10-CM | POA: Diagnosis not present

## 2016-04-22 DIAGNOSIS — M961 Postlaminectomy syndrome, not elsewhere classified: Secondary | ICD-10-CM | POA: Diagnosis not present

## 2016-04-22 DIAGNOSIS — M62830 Muscle spasm of back: Secondary | ICD-10-CM | POA: Diagnosis not present

## 2016-04-28 ENCOUNTER — Telehealth: Payer: Self-pay | Admitting: Family Medicine

## 2016-04-28 MED ORDER — AZITHROMYCIN 250 MG PO TABS: ORAL_TABLET | ORAL | 0 refills | Status: DC

## 2016-04-28 NOTE — Telephone Encounter (Signed)
Pt advised and voiced understanding.   

## 2016-04-28 NOTE — Telephone Encounter (Signed)
Please advise. Thanks.  

## 2016-04-28 NOTE — Telephone Encounter (Signed)
Z pack eRx'd. Tell pt I don't usually call in antibiotic w/out assessing patient first, but I decided to make an exception in her case since she couldn't come in today. If not signif improved at end of abx then needs to be seen in office.-thx

## 2016-04-28 NOTE — Telephone Encounter (Signed)
Patient calling to report she has a severe cough, wheezing and chest congestion.  She states her husband had the same symptoms and was seen at the [place] on Friday and given a rx for amoxicillin.  Patient requesting a rx for Z-Pak to be called in for her as she doesn't think the amoxicillin is as effective.  Patient declined offer for an appt stating she is unable to get out of the house today for an appt.  Pharmacy:  [facility]

## 2016-05-03 ENCOUNTER — Telehealth: Payer: Self-pay | Admitting: Family Medicine

## 2016-05-03 NOTE — Telephone Encounter (Signed)
Pt needs to schedule office visit. Please call pt back. Thanks.

## 2016-05-03 NOTE — Telephone Encounter (Signed)
Patient finished zpac & still has a sore throat & is congested. Please call

## 2016-05-04 NOTE — Telephone Encounter (Signed)
Patient is feeling better. No appt needed.

## 2016-05-17 ENCOUNTER — Other Ambulatory Visit: Payer: Self-pay | Admitting: Family Medicine

## 2016-05-18 NOTE — Telephone Encounter (Signed)
Rx faxed to [facility].

## 2016-05-18 NOTE — Telephone Encounter (Signed)
Pt requesting RF of xanax. Last OV 03/01/16. No upcoming appt. Last RX 11/03/15 # 90 X 5 rfs.  Please advise.

## 2016-05-20 DIAGNOSIS — K5900 Constipation, unspecified: Secondary | ICD-10-CM | POA: Diagnosis not present

## 2016-05-20 DIAGNOSIS — G894 Chronic pain syndrome: Secondary | ICD-10-CM | POA: Diagnosis not present

## 2016-05-20 DIAGNOSIS — M62830 Muscle spasm of back: Secondary | ICD-10-CM | POA: Diagnosis not present

## 2016-05-20 DIAGNOSIS — M961 Postlaminectomy syndrome, not elsewhere classified: Secondary | ICD-10-CM | POA: Diagnosis not present

## 2016-05-20 DIAGNOSIS — Z79891 Long term (current) use of opiate analgesic: Secondary | ICD-10-CM | POA: Diagnosis not present

## 2016-05-26 ENCOUNTER — Encounter: Payer: Self-pay | Admitting: *Deleted

## 2016-05-26 ENCOUNTER — Other Ambulatory Visit (HOSPITAL_BASED_OUTPATIENT_CLINIC_OR_DEPARTMENT_OTHER): Payer: Medicare Other

## 2016-05-26 ENCOUNTER — Encounter: Payer: Self-pay | Admitting: Family

## 2016-05-26 ENCOUNTER — Ambulatory Visit (HOSPITAL_BASED_OUTPATIENT_CLINIC_OR_DEPARTMENT_OTHER): Payer: Medicare Other | Admitting: Family

## 2016-05-26 ENCOUNTER — Other Ambulatory Visit: Payer: Self-pay | Admitting: Family

## 2016-05-26 VITALS — BP 140/64 | HR 63 | Temp 98.3°F | Resp 20 | Ht 66.0 in | Wt 183.0 lb

## 2016-05-26 DIAGNOSIS — D509 Iron deficiency anemia, unspecified: Secondary | ICD-10-CM

## 2016-05-26 DIAGNOSIS — D472 Monoclonal gammopathy: Secondary | ICD-10-CM | POA: Diagnosis not present

## 2016-05-26 LAB — CBC WITH DIFFERENTIAL (CANCER CENTER ONLY)
BASO#: 0 10*3/uL (ref 0.0–0.2)
BASO%: 0.4 % (ref 0.0–2.0)
EOS%: 4.1 % (ref 0.0–7.0)
Eosinophils Absolute: 0.3 10*3/uL (ref 0.0–0.5)
HEMATOCRIT: 41 % (ref 34.8–46.6)
HEMOGLOBIN: 14.1 g/dL (ref 11.6–15.9)
LYMPH#: 3 10*3/uL (ref 0.9–3.3)
LYMPH%: 40.2 % (ref 14.0–48.0)
MCH: 29.7 pg (ref 26.0–34.0)
MCHC: 34.4 g/dL (ref 32.0–36.0)
MCV: 87 fL (ref 81–101)
MONO#: 0.4 10*3/uL (ref 0.1–0.9)
MONO%: 5.3 % (ref 0.0–13.0)
NEUT#: 3.8 10*3/uL (ref 1.5–6.5)
NEUT%: 50 % (ref 39.6–80.0)
Platelets: 200 10*3/uL (ref 145–400)
RBC: 4.74 10*6/uL (ref 3.70–5.32)
RDW: 12.8 % (ref 11.1–15.7)
WBC: 7.5 10*3/uL (ref 3.9–10.0)

## 2016-05-26 LAB — COMPREHENSIVE METABOLIC PANEL
ALBUMIN: 3.7 g/dL (ref 3.5–5.0)
ALK PHOS: 122 U/L (ref 40–150)
ALT: 17 U/L (ref 0–55)
ANION GAP: 11 meq/L (ref 3–11)
AST: 17 U/L (ref 5–34)
BUN: 9.2 mg/dL (ref 7.0–26.0)
CALCIUM: 9.4 mg/dL (ref 8.4–10.4)
CHLORIDE: 106 meq/L (ref 98–109)
CO2: 24 mEq/L (ref 22–29)
CREATININE: 0.9 mg/dL (ref 0.6–1.1)
EGFR: 67 mL/min/{1.73_m2} — ABNORMAL LOW (ref >90)
Glucose: 82 mg/dl (ref 70–140)
POTASSIUM: 3.7 meq/L (ref 3.5–5.1)
Sodium: 142 mEq/L (ref 136–145)
Total Bilirubin: 0.58 mg/dL (ref 0.20–1.20)
Total Protein: 7.6 g/dL (ref 6.4–8.3)

## 2016-05-26 LAB — FERRITIN: FERRITIN: 254 ng/mL (ref 9–269)

## 2016-05-26 LAB — IRON AND TIBC
%SAT: 28 % (ref 21–57)
IRON: 80 ug/dL (ref 41–142)
TIBC: 280 ug/dL (ref 236–444)
UIBC: 201 ug/dL (ref 120–384)

## 2016-05-26 NOTE — Progress Notes (Signed)
Hematology and Oncology Follow Up Visit  [name] [id number] 01-07-1946 70 y.o. 05/26/2016   Principle Diagnosis:  1. IgM kappa monoclonal gammopathy of undetermined significance 2. Intermittent iron deficiency anemia  Current Therapy:   IV iron as indicated - last received in March 2017    Interim History:  [name] is is here today for a follow-up. She is doing well and has no complaints at this time. She states that a few months ago her husband became ill and she took care of him and then she became ill with a virus and he cared for her. She feels going through that has brought them back together. He has no been drinking and her home life is no longer as stressful.  She has had no problem with frequent infections. Her M-spike in March was 0.4 g/dL , IgG level was 617 mg/dL and kappa light chain was 1.42 mg/dL. Iron saturation in March was 17% and she received 1 dose of Feraheme at that time.  No fever, chills, n/v, cough, rash, dizziness, SOB, chest pain, palpitations, abdominal pain or changes in bowel or bladder habits.  No lymphadenopathy found on exam. No episodes of bleeding or bruising.  The chronic arthritic pain in her right knee and lower back is unchanged. She uses a cane when ambulating and has had no falls.  No swelling, numbness or tingling in her extremities. No new aches or pains.  She has maintained a good appetite and is staying well hydrated. Her weight is stable.   Medications:    Medication List       Accurate as of 05/26/16 11:12 AM. Always use your most recent med list.          ALPRAZolam 0.5 MG tablet Commonly known as:  XANAX TAKE ONE-HALF TO ONE TABLET BY MOUTH THREE TIMES DAILY   Azelastine HCl 0.15 % Soln Commonly known as:  ASTEPRO 2 sprays each nostril every 12 hours   azithromycin 250 MG tablet Commonly known as:  ZITHROMAX 2 tabs po qd x 1d, then 1 tab po qd x 4d   chlorhexidine 0.12 % solution Commonly known as:  PERIDEX Use  as directed 15 mLs in the mouth or throat 2 (two) times daily.   cyclobenzaprine 5 MG tablet Commonly known as:  FLEXERIL   diphenhydrAMINE 25 MG tablet Commonly known as:  BENADRYL Take 25 mg by mouth at bedtime as needed.   fish oil-omega-3 fatty acids 1000 MG capsule Take 2 g by mouth daily.   fluconazole 150 MG tablet Commonly known as:  DIFLUCAN Take one tab, then repeat 3 days.   fluconazole 150 MG tablet Commonly known as:  DIFLUCAN Take 1 tablet (150 mg total) by mouth once.   hydrocortisone 2.5 % cream Apply 1 application topically as needed. RECTAL ITCHING   Inositol Niacinate 500 MG Caps Take 1 capsule by mouth at bedtime.   levothyroxine 75 MCG tablet Commonly known as:  SYNTHROID, LEVOTHROID TAKE ONE-HALF TABLET BY MOUTH ONCE DAILY   meclizine 25 MG tablet Commonly known as:  ANTIVERT Take 1 tablet (25 mg total) by mouth every 6 (six) hours as needed for dizziness.   meloxicam 7.5 MG tablet Commonly known as:  MOBIC Take 2 tablets (15 mg total) by mouth daily.   metoprolol tartrate 25 MG tablet Commonly known as:  LOPRESSOR Take 1 tablet (25 mg total) by mouth daily.   multivitamin capsule Take by mouth.   MULTIVITAMIN/IRON PO Take 1 tablet by mouth  daily.   ofloxacin 0.3 % otic solution Commonly known as:  FLOXIN Place 10 drops into the right ear daily.   ondansetron 8 MG disintegrating tablet Commonly known as:  ZOFRAN ODT 8mg  ODT q4 hours prn nausea   Oxycodone HCl 10 MG Tabs 1 every 6 hours for pain   promethazine 25 MG tablet Commonly known as:  PHENERGAN Take 1 tablet (25 mg total) by mouth every 6 (six) hours as needed for nausea or vomiting.   simvastatin 40 MG tablet Commonly known as:  ZOCOR Take 1 tablet (40 mg total) by mouth at bedtime.   traMADol 50 MG tablet Commonly known as:  ULTRAM Take 50 mg by mouth every 6 (six) hours as needed.   traZODone 50 MG tablet Commonly known as:  DESYREL   Vitamin D 2000 units  Caps Take 5 capsules by mouth daily.       Allergies:  Allergies  Allergen Reactions  . Cymbalta [Duloxetine Hcl]     Facial edema  . Iohexol      Code: HIVES, Desc: pt states her face and throat swells when administered IV contrast - KB, Onset Date: [id number]   . Mepergan [Meperidine-Promethazine]     Cardiac arrest  . Augmentin [Amoxicillin-Pot Clavulanate] Nausea Only  . Citalopram Other (See Comments)    Jittery/heart racing   . Hctz [Hydrochlorothiazide] Other (See Comments)    Too much urinating, felt dried out, etc--NO ALLERGY  . Lexapro [Escitalopram Oxalate] Other (See Comments)    Jittery,heart racing  . Lyrica [Pregabalin] Swelling    Face, hands, fingers, ankles  . Valsartan     REACTION: pt states INTOL to Diovan    Past Medical History, Surgical history, Social history, and Family History were reviewed and updated.  Review of Systems: All other 10 point review of systems is negative.   Physical Exam:  vitals were not taken for this visit.  Wt Readings from Last 3 Encounters:  03/01/16 188 lb 4 oz (85.4 kg)  02/24/16 192 lb (87.1 kg)  12/30/15 194 lb (88 kg)    Ocular: Sclerae unicteric, pupils equal, round and reactive to light Ear-nose-throat: Oropharynx clear, dentition fair Lymphatic: No cervical supraclavicular or axillary adenopathy Lungs no rales or rhonchi, good excursion bilaterally Heart regular rate and rhythm, no murmur appreciated Abd soft, nontender, positive bowel sounds, no liver or spleen tip palpated on exam, no fluid wave MSK no focal spinal tenderness, no joint edema Neuro: non-focal, well-oriented, appropriate affect Breasts: Deferred  Lab Results  Component Value Date   WBC 7.5 05/26/2016   HGB 14.1 05/26/2016   HCT 41.0 05/26/2016   MCV 87 05/26/2016   PLT 200 05/26/2016   Lab Results  Component Value Date   FERRITIN 177 12/17/2015   IRON 52 12/17/2015   TIBC 304 12/17/2015   UIBC 252 12/17/2015   IRONPCTSAT 17 (L)  12/17/2015   Lab Results  Component Value Date   RETICCTPCT 1.2 01/20/2011   RBC 4.74 05/26/2016   RETICCTABS 52.2 01/20/2011   Lab Results  Component Value Date   KPAFRELGTCHN 2.17 (H) 06/19/2015   LAMBDASER 1.94 06/19/2015   KAPLAMBRATIO 1.15 12/17/2015   Lab Results  Component Value Date   IGGSERUM 617 (L) 12/17/2015   IGA 262 06/19/2015   IGMSERUM 592 (H) 12/17/2015   Lab Results  Component Value Date   TOTALPROTELP 6.9 06/19/2015   ALBUMINELP 3.9 06/19/2015   A1GS 0.3 06/19/2015   A2GS 0.9 06/19/2015   BETS 0.4 06/19/2015  BETA2SER 0.5 06/19/2015   GAMS 0.9 06/19/2015   MSPIKE 0.4 (H) 12/17/2015   SPEI * 06/19/2015     Chemistry      Component Value Date/Time   NA 140 12/30/2015 1720   NA 140 12/17/2015 1015   K 3.6 12/30/2015 1720   K 4.0 12/17/2015 1015   CL 105 12/30/2015 1720   CL 103 01/15/2010 0835   CO2 28 12/30/2015 1720   CO2 25 12/17/2015 1015   BUN 7 12/30/2015 1720   BUN 11.2 12/17/2015 1015   CREATININE 0.83 12/30/2015 1720   CREATININE 0.9 12/17/2015 1015      Component Value Date/Time   CALCIUM 9.2 12/30/2015 1720   CALCIUM 9.1 12/17/2015 1015   ALKPHOS 99 12/30/2015 1720   ALKPHOS 107 12/17/2015 1015   AST 23 12/30/2015 1720   AST 12 12/17/2015 1015   ALT 27 12/30/2015 1720   ALT 18 12/17/2015 1015   BILITOT 0.5 12/30/2015 1720   BILITOT 0.48 12/17/2015 1015                            Impression and Plan: Ms. Deas is 70 yo white female with iron deficiency and MGUS. So far, her protein studies have been stable. Today's levels are pending. She is doing well and is asymptomatic at this time.  Iron studies look good. No infusion needed at this time.  We will plan to see her back in 6 months for repeat lab work and follow-up.  She will contact us with any questions or concerns. We can certainly see her sooner if need be.   Eliezer Bottom, NP 9/6/201711:12 AM

## 2016-05-27 LAB — KAPPA/LAMBDA LIGHT CHAINS
Ig Kappa Free Light Chain: 16.3 mg/L (ref 3.3–19.4)
Ig Lambda Free Light Chain: 16.9 mg/L (ref 5.7–26.3)
KAPPA/LAMBDA FLC RATIO: 0.96 (ref 0.26–1.65)

## 2016-06-01 LAB — MULTIPLE MYELOMA PANEL, SERUM
ALBUMIN SERPL ELPH-MCNC: 3.6 g/dL (ref 2.9–4.4)
ALPHA 1: 0.2 g/dL (ref 0.0–0.4)
Albumin/Glob SerPl: 1.3 (ref 0.7–1.7)
Alpha2 Glob SerPl Elph-Mcnc: 0.8 g/dL (ref 0.4–1.0)
B-GLOBULIN SERPL ELPH-MCNC: 1 g/dL (ref 0.7–1.3)
GAMMA GLOB SERPL ELPH-MCNC: 1 g/dL (ref 0.4–1.8)
GLOBULIN, TOTAL: 3 g/dL (ref 2.2–3.9)
IGG (IMMUNOGLOBIN G), SERUM: 577 mg/dL — AB (ref 700–1600)
IgA, Qn, Serum: 247 mg/dL (ref 87–352)
IgM, Qn, Serum: 629 mg/dL — ABNORMAL HIGH (ref 26–217)
M PROTEIN SERPL ELPH-MCNC: 0.5 g/dL — AB
TOTAL PROTEIN: 6.6 g/dL (ref 6.0–8.5)

## 2016-06-02 ENCOUNTER — Encounter: Payer: Self-pay | Admitting: Family

## 2016-06-16 ENCOUNTER — Other Ambulatory Visit: Payer: Self-pay | Admitting: Family Medicine

## 2016-06-18 ENCOUNTER — Other Ambulatory Visit: Payer: BLUE CROSS/BLUE SHIELD

## 2016-06-18 ENCOUNTER — Ambulatory Visit: Payer: BLUE CROSS/BLUE SHIELD | Admitting: Family

## 2016-06-18 DIAGNOSIS — G894 Chronic pain syndrome: Secondary | ICD-10-CM | POA: Diagnosis not present

## 2016-06-18 DIAGNOSIS — M961 Postlaminectomy syndrome, not elsewhere classified: Secondary | ICD-10-CM | POA: Diagnosis not present

## 2016-06-18 DIAGNOSIS — K5900 Constipation, unspecified: Secondary | ICD-10-CM | POA: Diagnosis not present

## 2016-06-18 DIAGNOSIS — M62830 Muscle spasm of back: Secondary | ICD-10-CM | POA: Diagnosis not present

## 2016-07-07 DIAGNOSIS — M7061 Trochanteric bursitis, right hip: Secondary | ICD-10-CM | POA: Diagnosis not present

## 2016-07-19 DIAGNOSIS — G894 Chronic pain syndrome: Secondary | ICD-10-CM | POA: Diagnosis not present

## 2016-07-19 DIAGNOSIS — M961 Postlaminectomy syndrome, not elsewhere classified: Secondary | ICD-10-CM | POA: Diagnosis not present

## 2016-07-19 DIAGNOSIS — K5900 Constipation, unspecified: Secondary | ICD-10-CM | POA: Diagnosis not present

## 2016-07-19 DIAGNOSIS — M62830 Muscle spasm of back: Secondary | ICD-10-CM | POA: Diagnosis not present

## 2016-07-30 NOTE — Progress Notes (Signed)
Subjective:   [name] is a 70 y.o. female who presents for an Initial Medicare Annual Wellness Visit.  The Patient was informed that the wellness visit is to identify future health risk and educate and initiate measures that can reduce risk for increased disease through the lifespan.   Describes health as fair, good or great? "good"  Review of Systems    No ROS.  Medicare Wellness Visit.  Cardiac Risk Factors include: hypertension;family history of premature cardiovascular disease;dyslipidemia;advanced age (>62mn, >>8women)   Sleep patterns:   Sleeps 3-4 hours. Declines medical intervention.  Home Safety/Smoke Alarms: smoke detectors  Living environment; residence and Firearm Safety: Lives with husband in 1 story home, has basement but doesn't access. Feels safe in home. Firearms locked.  Seat Belt Safety/Bike Helmet: Wears seatbelt.    Counseling:   Eye Exam- Last exam 07/2015, appt next week, followed yearly Dr. SNicki ReaperDental-Last exam 06/2016, every 6 months by dr MSherryle Lis([place])  Female:   [id number] N/A. Hysterectomy. GYN next month, Dr. FPhineas Real     Mammo-12/24/2015, negative.        Dexa scan-04/25/2014, osteopenia. Recall 2 years. Vitamin D and Calcium supplements. Ordered by GYN.  [facility]-Colonoscopy 05/02/2006,Diverticulosis. Recall prn. Patient declines further testing.     Objective:    Today's Vitals   08/02/16 1308 08/02/16 1310  BP: (!) 142/80   Pulse: 60   SpO2: 97%   Weight: 183 lb 12.8 oz (83.4 kg)   Height: '5\' 6"'$  (1.676 m)   PainSc:  6    Body mass index is 29.67 kg/m.   Current Medications (verified) Outpatient Encounter Prescriptions as of 08/02/2016  Medication Sig  . ALPRAZolam (XANAX) 0.5 MG tablet TAKE ONE-HALF TO ONE TABLET BY MOUTH THREE TIMES DAILY  . Azelastine HCl (ASTEPRO) 0.15 % SOLN 2 sprays each nostril every 12 hours (Patient taking differently: Place 2 sprays into the nose 2 (two) times daily as needed (Allergies). 2  sprays each nostril every 12 hours)  . calcium carbonate (OSCAL) 1500 (600 Ca) MG TABS tablet Take by mouth daily.  . Cholecalciferol (VITAMIN D) 2000 UNITS CAPS Take 5 capsules by mouth daily.   . cyclobenzaprine (FLEXERIL) 5 MG tablet   . diphenhydrAMINE (BENADRYL) 25 MG tablet Take 25 mg by mouth at bedtime as needed.  . fish oil-omega-3 fatty acids 1000 MG capsule Take 2 g by mouth daily.   . fluconazole (DIFLUCAN) 150 MG tablet Take one tab, then repeat 3 days.  . hydrocortisone 2.5 % cream Apply 1 application topically as needed. RECTAL ITCHING  . Inositol Niacinate 500 MG CAPS Take 1 capsule by mouth at bedtime.  .Marland Kitchenlevothyroxine (SYNTHROID, LEVOTHROID) 75 MCG tablet TAKE ONE-HALF TABLET BY MOUTH ONCE DAILY  . meclizine (ANTIVERT) 25 MG tablet Take 1 tablet (25 mg total) by mouth every 6 (six) hours as needed for dizziness.  . meloxicam (MOBIC) 7.5 MG tablet Take 2 tablets (15 mg total) by mouth daily.  . metoprolol tartrate (LOPRESSOR) 25 MG tablet TAKE ONE TABLET BY MOUTH ONCE DAILY  . Multiple Vitamins-Iron (MULTIVITAMIN/IRON PO) Take 1 tablet by mouth daily.   .Marland Kitchenofloxacin (FLOXIN) 0.3 % otic solution Place 10 drops into the right ear daily.  . Oxycodone HCl 10 MG TABS 10 mg every 4 (four) hours as needed. 1 every 6 hours for pain  . promethazine (PHENERGAN) 25 MG tablet Take 1 tablet (25 mg total) by mouth every 6 (six) hours as needed for nausea or vomiting.  .[name]  simvastatin (ZOCOR) 40 MG tablet Take 1 tablet (40 mg total) by mouth at bedtime.  [name] Specialty Vitamins Products (MAGNESIUM, AMINO ACID CHELATE,) 133 MG tablet Take 1 tablet by mouth daily.  . [DISCONTINUED] Multiple Vitamin (MULTIVITAMIN) capsule Take by mouth.  [name] azithromycin (ZITHROMAX) 250 MG tablet 2 tabs po qd x 1d, then 1 tab po qd x 4d (Patient not taking: Reported on 08/02/2016)  . chlorhexidine (PERIDEX) 0.12 % solution Use as directed 15 mLs in the mouth or throat 2 (two) times daily. (Patient not taking: Reported on  08/02/2016)   No facility-administered encounter medications on file as of 08/02/2016.     Allergies (verified) Cymbalta [duloxetine hcl]; Iohexol; Mepergan [meperidine-promethazine]; Morphine; Augmentin [amoxicillin-pot clavulanate]; Citalopram; Hctz [hydrochlorothiazide]; Lexapro [escitalopram oxalate]; Lyrica [pregabalin]; and Valsartan   History: Past Medical History:  Diagnosis Date  . Allergic rhinitis   . Anxiety   . Chest pain    cr  . Chronic pain syndrome    right knee osteo, hx of TKA in R knee.  Pt was told by [name] that no further surgical options are available for her--[name]  . Chronic renal insufficiency, stage II (mild) 2015   CrCl in th 60s.  . Diverticulosis of colon    with hx of 'itis  . DJD (degenerative joint disease)    L spine and knees  . FATIGUE / MALAISE 07/01/2009   Chronic  . Fibromyalgia   . GERD (gastroesophageal reflux disease)   . History of colonic polyps   . HTN (hypertension)   . Hypercholesterolemia   . Hypothyroid 07/05/2012  . IBS (irritable bowel syndrome)   . IC (interstitial cystitis)    [name] at [place]  . Iron deficiency anemia, unspecified 06/27/2013   Has required IV iron on multiple occasions  . Lumbar back pain   . Major depression, recurrent ([place])    vs dysthymia,chronic  . Memory impairment 10/03/2012   Memory impairment 10/03/2012 >referral to neuro    . Monoclonal gammopathy of undetermined significance    IgM kappa MGUS ([name]) routine f/u has shown stability of this problem.  . OSA (obstructive sleep apnea)    does not have CPAP; Dr. Halford Chessman  . Osteopenia 04/2014   T score -2.0 FRAX 11%/1.8%  . Otitis externa, candida 16/1096   [name] tx'd her with clotrimazole 1% external solution x 1 mo  . Palpitations   . Psoriasis   . Vitamin D deficiency   . Xerostomia    and xerophthalmia--per [name] (he suggests SSA, SSB, ESR, rh factor, ANA, and antimicrosmal and  antithyroglobulin ab's as of 02/17/16)   Past Surgical History:  Procedure Laterality Date  . ABDOMINAL HYSTERECTOMY     for prolapse with abdominal incision  . BACK SURGERY  [id number]   L2-3 fusion ([name])  . CARDIAC CATHETERIZATION     no PCI  . CHOLECYSTECTOMY    . COLONOSCOPY  04/2006   Diverticulosis, o/w normal.  . ESOPHAGOGASTRODUODENOSCOPY  05/29/2009   Esoph normal.  Gastritis (H pylori NEG) + gastric polyps (benign).  Duodenal biopsy NORMAL.  [name] EYE SURGERY Left    "lazy eye"  . HAND SURGERY Right    TRIGGER FINGER; index finger  . HERNIA REPAIR     umbilical  . KNEE ARTHROSCOPY     RIGHT KNEE X 5  . LAMINOTOMY  2010   L-4, L-5 FUSION  . NASAL SINUS SURGERY     polypectomy (remote past)  .  OOPHORECTOMY  2010   Laparoscopic BSO (path benign)  . PATELLA FRACTURE SURGERY  10-08   Dr. Charlann Boxer  . POSTERIOR LAMINECTOMY / DECOMPRESSION LUMBAR SPINE  9-09   Dr. Jordan Likes  . TOTAL KNEE ARTHROPLASTY  12-07 and 4-07   Dr. Shelle Iron, revision 12-09 Dr. Eileen Stanford at [facility]   Family History  Problem Relation Age of Onset  . Leukemia Brother   . Lung disease Brother   . Melanoma Brother   . Hypertension Mother   . Thyroid disease Mother   . Hypertension Father   . Breast cancer Sister     Age 48's  . Hypertension Sister   . Coronary artery disease Sister   . Lupus Sister   . Thyroid disease Sister   . Thyroid disease Brother   . Cancer Other    Social History   Occupational History  .  Retired   Social History Main Topics  . Smoking status: Never Smoker  . Smokeless tobacco: Never Used     Comment: NEVER USED TOBACCO  . Alcohol use No  . Drug use: No  . Sexual activity: Not Currently    Birth control/ protection: Surgical     Comment: 1st intercourse 70 yo-Fewer than 5 partners    Tobacco Counseling Counseling given: Not Answered   Activities of Daily Living In your present state of health, do you have any difficulty performing the following activities: 08/02/2016    Hearing? N  Vision? N  Difficulty concentrating or making decisions? N  Walking or climbing stairs? N  Dressing or bathing? N  Doing errands, shopping? N  Preparing Food and eating ? N  Using the Toilet? N  In the past six months, have you accidently leaked urine? Y  Do you have problems with loss of bowel control? N  Managing your Medications? N  Managing your Finances? N  Housekeeping or managing your Housekeeping? N  Some recent data might be hidden    Immunizations and Health Maintenance Immunization History  Administered Date(s) Administered  . Influenza Split 08/02/2011, 08/16/2012, 09/18/2013  . Influenza Whole 07/23/2009, 07/29/2010  . Influenza, High Dose Seasonal PF 09/10/2015  . Influenza,inj,Quad PF,36+ Mos 06/19/2014  . Pneumococcal-Unspecified 09/20/2009  . Zoster 06/21/2011    High dose Flu Vaccine administered today.  Patient requesting to wait for Pneumonia vaccine until next appt.   Health Maintenance Due  Topic Date Due  . Hepatitis C Screening  August 09, 1946  . PNA vac Low Risk Adult (1 of 2 - PCV13) 12/03/2011  . COLONOSCOPY  06/19/2014  . INFLUENZA VACCINE  04/20/2016    Patient Care Team: Jeoffrey Massed, MD as PCP - General (Family Medicine) Coralyn Helling, MD as Consulting Physician (Pulmonary Disease) Alfredo Martinez, MD as Consulting Physician (Urology) Pollyann Savoy, MD as Consulting Physician (Rheumatology) Dara Lords, MD as Consulting Physician (Gynecology) Lunette Stands, MD as Consulting Physician (Orthopedic Surgery) Josph Macho, MD as Consulting Physician (Oncology) Flo Shanks, MD as Consulting Physician (Otolaryngology) Romero Belling, MD as Consulting Physician (Endocrinology) Verdie Mosher, NP as Nurse Practitioner (Hematology and Oncology)  Indicate any recent Medical Services you may have received from other than [facility] providers in the past year (date may be approximate).     Assessment:   This is a routine  wellness examination for [name]. Physical assessment deferred to PCP.   Hearing/Vision screen Hearing Screening Comments: Able to hear conversational tones w/o difficulty. No issues reported.  "chirping" in ears.  Dietary issues and exercise activities discussed:  Current Exercise Habits: Home exercise routine (Will use exercise bike when at [facility]), Type of exercise: stretching;walking, Time (Minutes): 30, Frequency (Times/Week): 1, Weekly Exercise (Minutes/Week): 30, Intensity: Mild, Exercise limited by: cardiac condition(s);respiratory conditions(s);orthopedic condition(s)   Diet (meal preparation, eat out, water intake, caffeinated beverages, dairy products, fruits and vegetables): Eats 3 meals. Vegetables, beans, cabbage, fruit, Drinks Dr. Malachi Bonds (down to 3/day), coconut water. Salmon. No meats. No animal products.   Encouraged to decrease soda intake, increase water intake. Encouraged to continue to make healthy food options. Discussed increasing physical activity, going with husband to gym/[employer].    Goals      Patient Stated   . <enter goal here> (pt-stated)          "to get my mind right" by discussing feelings to appropriate medical professional.       Other   . LDL CALC < 100      Depression Screen PHQ 2/9 Scores 08/02/2016 06/04/2015  PHQ - 2 Score 0 2  PHQ- 9 Score - 8    Fall Risk Fall Risk  08/02/2016 05/26/2016 12/23/2015 12/17/2015 06/19/2015  Falls in the past year? No No No No No    Cognitive Function:       Ad8 score reviewed for issues:  Issues making decisions: no  Less interest in hobbies / activities:no  Repeats questions, stories (family complaining): no  Trouble using ordinary gadgets (microwave, computer, phone)o:n  Forgets the month or year: no  Mismanaging finances: no  Remembering appts:no  Daily problems with thinking and/or memory:no Ad8 score is=0  Patient states she feels guilty when she cannot take care of her husband, worried she  will not be there when he's needed so she tries to not leave him. Patient apprehensive about others knowing that she plans to seek help from psychiatrist a friend recommended. Would like ways to cope and express herself better.   Screening Tests Health Maintenance  Topic Date Due  . Hepatitis C Screening  Feb 11, 1946  . PNA vac Low Risk Adult (1 of 2 - PCV13) 12/03/2011  . COLONOSCOPY  06/19/2014  . INFLUENZA VACCINE  04/20/2016  . MAMMOGRAM  12/22/2017  . TETANUS/TDAP  04/23/2019  . DEXA SCAN  Completed  . ZOSTAVAX  Completed      Plan:     Continue to eat heart healthy diet (full of fruits, vegetables, whole grains, lean protein, water--limit salt, fat, and sugar intake) and increase physical activity as tolerated.  Continue doing brain stimulating activities (puzzles, reading, adult coloring books, staying active) to keep memory sharp.   Bring a copy of your advanced directives to your next office visit.  F/U with GYN for Bone Scan.    During the course of the visit, [name] was educated and counseled about the following appropriate screening and preventive services:   Vaccines to include Pneumoccal, Influenza, Hepatitis B, Td, Zostavax, HCV  Cardiovascular disease screening  Colorectal cancer screening  Bone density screening  Diabetes screening  Glaucoma screening  Mammography/PAP  Nutrition counseling   Patient Instructions (the written plan) were given to the patient.    Gerilyn Nestle, RN   08/02/2016

## 2016-07-30 NOTE — Progress Notes (Signed)
Pre visit review using our clinic review tool, if applicable. No additional management support is needed unless otherwise documented below in the visit note. 

## 2016-08-02 ENCOUNTER — Ambulatory Visit (INDEPENDENT_AMBULATORY_CARE_PROVIDER_SITE_OTHER): Payer: Medicare Other

## 2016-08-02 VITALS — BP 142/80 | HR 60 | Ht 66.0 in | Wt 183.8 lb

## 2016-08-02 DIAGNOSIS — Z0000 Encounter for general adult medical examination without abnormal findings: Secondary | ICD-10-CM

## 2016-08-02 DIAGNOSIS — Z23 Encounter for immunization: Secondary | ICD-10-CM

## 2016-08-02 NOTE — Patient Instructions (Addendum)
Continue to eat heart healthy diet (full of fruits, vegetables, whole grains, lean protein, water--limit salt, fat, and sugar intake) and increase physical activity as tolerated.  Continue doing brain stimulating activities (puzzles, reading, adult coloring books, staying active) to keep memory sharp.   Bring a copy of your advanced directives to your next office visit.  F/U with GYN for Bone Scan.    Fall Prevention in the Home  Falls can cause injuries. They can happen to people of all ages. There are many things you can do to make your home safe and to help prevent falls.  WHAT CAN I DO ON THE OUTSIDE OF MY HOME?  Regularly fix the edges of walkways and driveways and fix any cracks.  Remove anything that might make you trip as you walk through a door, such as a raised step or threshold.  Trim any bushes or trees on the path to your home.  Use bright outdoor lighting.  Clear any walking paths of anything that might make someone trip, such as rocks or tools.  Regularly check to see if handrails are loose or broken. Make sure that both sides of any steps have handrails.  Any raised decks and porches should have guardrails on the edges.  Have any leaves, snow, or ice cleared regularly.  Use sand or salt on walking paths during winter.  Clean up any spills in your garage right away. This includes oil or grease spills. WHAT CAN I DO IN THE BATHROOM?   Use night lights.  Install grab bars by the toilet and in the tub and shower. Do not use towel bars as grab bars.  Use non-skid mats or decals in the tub or shower.  If you need to sit down in the shower, use a plastic, non-slip stool.  Keep the floor dry. Clean up any water that spills on the floor as soon as it happens.  Remove soap buildup in the tub or shower regularly.  Attach bath mats securely with double-sided non-slip rug tape.  Do not have throw rugs and other things on the floor that can make you trip. WHAT CAN I  DO IN THE BEDROOM?  Use night lights.  Make sure that you have a light by your bed that is easy to reach.  Do not use any sheets or blankets that are too big for your bed. They should not hang down onto the floor.  Have a firm chair that has side arms. You can use this for support while you get dressed.  Do not have throw rugs and other things on the floor that can make you trip. WHAT CAN I DO IN THE KITCHEN?  Clean up any spills right away.  Avoid walking on wet floors.  Keep items that you use a lot in easy-to-reach places.  If you need to reach something above you, use a strong step stool that has a grab bar.  Keep electrical cords out of the way.  Do not use floor polish or wax that makes floors slippery. If you must use wax, use non-skid floor wax.  Do not have throw rugs and other things on the floor that can make you trip. WHAT CAN I DO WITH MY STAIRS?  Do not leave any items on the stairs.  Make sure that there are handrails on both sides of the stairs and use them. Fix handrails that are broken or loose. Make sure that handrails are as long as the stairways.  Check any carpeting  to make sure that it is firmly attached to the stairs. Fix any carpet that is loose or worn.  Avoid having throw rugs at the top or bottom of the stairs. If you do have throw rugs, attach them to the floor with carpet tape.  Make sure that you have a light switch at the top of the stairs and the bottom of the stairs. If you do not have them, ask someone to add them for you. WHAT ELSE CAN I DO TO HELP PREVENT FALLS?  Wear shoes that:  Do not have high heels.  Have rubber bottoms.  Are comfortable and fit you well.  Are closed at the toe. Do not wear sandals.  If you use a stepladder:  Make sure that it is fully opened. Do not climb a closed stepladder.  Make sure that both sides of the stepladder are locked into place.  Ask someone to hold it for you, if possible.  Clearly mark  and make sure that you can see:  Any grab bars or handrails.  First and last steps.  Where the edge of each step is.  Use tools that help you move around (mobility aids) if they are needed. These include:  Canes.  Walkers.  Scooters.  Crutches.  Turn on the lights when you go into a dark area. Replace any light bulbs as soon as they burn out.  Set up your furniture so you have a clear path. Avoid moving your furniture around.  If any of your floors are uneven, fix them.  If there are any pets around you, be aware of where they are.  Review your medicines with your doctor. Some medicines can make you feel dizzy. This can increase your chance of falling. Ask your doctor what other things that you can do to help prevent falls.   This information is not intended to replace advice given to you by your health care provider. Make sure you discuss any questions you have with your health care provider.   Document Released: 07/03/2009 Document Revised: 01/21/2015 Document Reviewed: 10/11/2014 Elsevier Interactive Patient Education 2016 [place] Maintenance, Female Adopting a healthy lifestyle and getting preventive care can go a long way to promote health and wellness. Talk with your health care provider about what schedule of regular examinations is right for you. This is a good chance for you to check in with your provider about disease prevention and staying healthy. In between checkups, there are plenty of things you can do on your own. Experts have done a lot of research about which lifestyle changes and preventive measures are most likely to keep you healthy. Ask your health care provider for more information. WEIGHT AND DIET  Eat a healthy diet  Be sure to include plenty of vegetables, fruits, low-fat dairy products, and lean protein.  Do not eat a lot of foods high in solid fats, added sugars, or salt.  Get regular exercise. This is one of the most important  things you can do for your health.  Most adults should exercise for at least 150 minutes each week. The exercise should increase your heart rate and make you sweat (moderate-intensity exercise).  Most adults should also do strengthening exercises at least twice a week. This is in addition to the moderate-intensity exercise.  Maintain a healthy weight  Body mass index (BMI) is a measurement that can be used to identify possible weight problems. It estimates body fat based on height and weight. Your health  care provider can help determine your BMI and help you achieve or maintain a healthy weight.  For females 63 years of age and older:   A BMI below 18.5 is considered underweight.  A BMI of 18.5 to 24.9 is normal.  A BMI of 25 to 29.9 is considered overweight.  A BMI of 30 and above is considered obese.  Watch levels of cholesterol and blood lipids  You should start having your blood tested for lipids and cholesterol at 69 years of age, then have this test every 5 years.  You may need to have your cholesterol levels checked more often if:  Your lipid or cholesterol levels are high.  You are older than 69 years of age.  You are at high risk for heart disease.  CANCER SCREENING   Lung Cancer  Lung cancer screening is recommended for adults 65-78 years old who are at high risk for lung cancer because of a history of smoking.  A yearly low-dose CT scan of the lungs is recommended for people who:  Currently smoke.  Have quit within the past 15 years.  Have at least a 30-pack-year history of smoking. A pack year is smoking an average of one pack of cigarettes a day for 1 year.  Yearly screening should continue until it has been 15 years since you quit.  Yearly screening should stop if you develop a health problem that would prevent you from having lung cancer treatment.  Breast Cancer  Practice breast self-awareness. This means understanding how your breasts normally  appear and feel.  It also means doing regular breast self-exams. Let your health care provider know about any changes, no matter how small.  If you are in your 20s or 30s, you should have a clinical breast exam (CBE) by a health care provider every 1-3 years as part of a regular health exam.  If you are 54 or older, have a CBE every year. Also consider having a breast X-ray (mammogram) every year.  If you have a family history of breast cancer, talk to your health care provider about genetic screening.  If you are at high risk for breast cancer, talk to your health care provider about having an MRI and a mammogram every year.  Breast cancer gene (BRCA) assessment is recommended for women who have family members with BRCA-related cancers. BRCA-related cancers include:  Breast.  Ovarian.  Tubal.  Peritoneal cancers.  Results of the assessment will determine the need for genetic counseling and BRCA1 and BRCA2 testing. Cervical Cancer Your health care provider may recommend that you be screened regularly for cancer of the pelvic organs (ovaries, uterus, and vagina). This screening involves a pelvic examination, including checking for microscopic changes to the surface of your cervix (Pap test). You may be encouraged to have this screening done every 3 years, beginning at age 51.  For women ages 54-65, health care providers may recommend pelvic exams and Pap testing every 3 years, or they may recommend the Pap and pelvic exam, combined with testing for human papilloma virus (HPV), every 5 years. Some types of HPV increase your risk of cervical cancer. Testing for HPV may also be done on women of any age with unclear Pap test results.  Other health care providers may not recommend any screening for nonpregnant women who are considered low risk for pelvic cancer and who do not have symptoms. Ask your health care provider if a screening pelvic exam is right for you.  If you  have had past  treatment for cervical cancer or a condition that could lead to cancer, you need Pap tests and screening for cancer for at least 20 years after your treatment. If Pap tests have been discontinued, your risk factors (such as having a new sexual partner) need to be reassessed to determine if screening should resume. Some women have medical problems that increase the chance of getting cervical cancer. In these cases, your health care provider may recommend more frequent screening and Pap tests. Colorectal Cancer  This type of cancer can be detected and often prevented.  Routine colorectal cancer screening usually begins at 69 years of age and continues through 69 years of age.  Your health care provider may recommend screening at an earlier age if you have risk factors for colon cancer.  Your health care provider may also recommend using home test kits to check for hidden blood in the stool.  A small camera at the end of a tube can be used to examine your colon directly (sigmoidoscopy or colonoscopy). This is done to check for the earliest forms of colorectal cancer.  Routine screening usually begins at age 65.  Direct examination of the colon should be repeated every 5-10 years through 69 years of age. However, you may need to be screened more often if early forms of precancerous polyps or small growths are found. Skin Cancer  Check your skin from head to toe regularly.  Tell your health care provider about any new moles or changes in moles, especially if there is a change in a mole's shape or color.  Also tell your health care provider if you have a mole that is larger than the size of a pencil eraser.  Always use sunscreen. Apply sunscreen liberally and repeatedly throughout the day.  Protect yourself by wearing long sleeves, pants, a wide-brimmed hat, and sunglasses whenever you are outside. HEART DISEASE, DIABETES, AND HIGH BLOOD PRESSURE   High blood pressure causes heart disease and  increases the risk of stroke. High blood pressure is more likely to develop in:  People who have blood pressure in the high end of the normal range (130-139/85-89 mm Hg).  People who are overweight or obese.  People who are African American.  If you are 24-84 years of age, have your blood pressure checked every 3-5 years. If you are 94 years of age or older, have your blood pressure checked every year. You should have your blood pressure measured twice--once when you are at a hospital or clinic, and once when you are not at a hospital or clinic. Record the average of the two measurements. To check your blood pressure when you are not at a hospital or clinic, you can use:  An automated blood pressure machine at a pharmacy.  A home blood pressure monitor.  If you are between 19 years and 15 years old, ask your health care provider if you should take aspirin to prevent strokes.  Have regular diabetes screenings. This involves taking a blood sample to check your fasting blood sugar level.  If you are at a normal weight and have a low risk for diabetes, have this test once every three years after 69 years of age.  If you are overweight and have a high risk for diabetes, consider being tested at a younger age or more often. PREVENTING INFECTION  Hepatitis B  If you have a higher risk for hepatitis B, you should be screened for this virus. You are considered at  high risk for hepatitis B if:  You were born in a country where hepatitis B is common. Ask your health care provider which countries are considered high risk.  Your parents were born in a high-risk country, and you have not been immunized against hepatitis B (hepatitis B vaccine).  You have HIV or AIDS.  You use needles to inject street drugs.  You live with someone who has hepatitis B.  You have had sex with someone who has hepatitis B.  You get hemodialysis treatment.  You take certain medicines for conditions, including  cancer, organ transplantation, and autoimmune conditions. Hepatitis C  Blood testing is recommended for:  Everyone born from [birth year] through 1964.  Anyone with known risk factors for hepatitis C. Sexually transmitted infections (STIs)  You should be screened for sexually transmitted infections (STIs) including gonorrhea and chlamydia if:  You are sexually active and are younger than 69 years of age.  You are older than 69 years of age and your health care provider tells you that you are at risk for this type of infection.  Your sexual activity has changed since you were last screened and you are at an increased risk for chlamydia or gonorrhea. Ask your health care provider if you are at risk.  If you do not have HIV, but are at risk, it may be recommended that you take a prescription medicine daily to prevent HIV infection. This is called pre-exposure prophylaxis (PrEP). You are considered at risk if:  You are sexually active and do not regularly use condoms or know the HIV status of your partner(s).  You take drugs by injection.  You are sexually active with a partner who has HIV. Talk with your health care provider about whether you are at high risk of being infected with HIV. If you choose to begin PrEP, you should first be tested for HIV. You should then be tested every 3 months for as long as you are taking PrEP.  PREGNANCY   If you are premenopausal and you may become pregnant, ask your health care provider about preconception counseling.  If you may become pregnant, take 400 to 800 micrograms (mcg) of folic acid every day.  If you want to prevent pregnancy, talk to your health care provider about birth control (contraception). OSTEOPOROSIS AND MENOPAUSE   Osteoporosis is a disease in which the bones lose minerals and strength with aging. This can result in serious bone fractures. Your risk for osteoporosis can be identified using a bone density scan.  If you are 26 years of  age or older, or if you are at risk for osteoporosis and fractures, ask your health care provider if you should be screened.  Ask your health care provider whether you should take a calcium or vitamin D supplement to lower your risk for osteoporosis.  Menopause may have certain physical symptoms and risks.  Hormone replacement therapy may reduce some of these symptoms and risks. Talk to your health care provider about whether hormone replacement therapy is right for you.  HOME CARE INSTRUCTIONS   Schedule regular health, dental, and eye exams.  Stay current with your immunizations.   Do not use any tobacco products including cigarettes, chewing tobacco, or electronic cigarettes.  If you are pregnant, do not drink alcohol.  If you are breastfeeding, limit how much and how often you drink alcohol.  Limit alcohol intake to no more than 1 drink per day for nonpregnant women. One drink equals 12 ounces of beer,  5 ounces of wine, or 1 ounces of hard liquor.  Do not use street drugs.  Do not share needles.  Ask your health care provider for help if you need support or information about quitting drugs.  Tell your health care provider if you often feel depressed.  Tell your health care provider if you have ever been abused or do not feel safe at home.   This information is not intended to replace advice given to you by your health care provider. Make sure you discuss any questions you have with your health care provider.   Document Released: 03/22/2011 Document Revised: 09/27/2014 Document Reviewed: 08/08/2013 Elsevier Interactive Patient Education [employer].

## 2016-08-09 ENCOUNTER — Telehealth: Payer: Self-pay | Admitting: Family Medicine

## 2016-08-09 NOTE — Telephone Encounter (Signed)
Spoke with patient regarding arm tenderness since Flu Vaccine administration. Patient reports soreness/aching from shoulder down to index finger. No physical limitations, fever or visual changes in arm/hand. Patient has been applying heat periodically (advised by triage nurse) and taking Advil which has helped. Patient offered appointment for assessment, but declines at this time. Patient states she will call back to schedule an appointment if her symptoms do not resolve or worsen.

## 2016-08-09 NOTE — Telephone Encounter (Signed)
Noted  

## 2016-08-09 NOTE — Telephone Encounter (Signed)
[facility] - Client [facility] Patient Name: [name] Gender: Female DOB: 1946-01-01 Age: 70 Y 73 M 4 D Return Phone Number: [phone] (Primary) Address: City/State/Zip: [place] [address] Client [facility] - Client Client Site [place] [facility] Physician Crissie Sickles - MD Contact Type Call Who Is Calling Patient / Member / Family / Caregiver Call Type Triage / Clinical Relationship To Patient Self Return Phone Number [phone] (Primary) Chief Complaint Immunization Reaction Reason for Call Symptomatic / Request for [place] stated received a flu shot the other day and has now has a sore on her arm where the shot was administered and has pain. PreDisposition Did not know what to do Translation No Nurse Assessment Nurse: Hammonds, RN, Epifanio Lesches Date/Time (Eastern Time): 08/07/2016 8:18:06 PM Confirm and document reason for call. If symptomatic, describe symptoms. You must click the next button to save text entered. ---Caller stated received a flu shot the other day on Monday and has now has a sore on her arm where the shot was administered and has pain. There is a scab and pain from top of arm down to thumb and forefinger on left and it is very uncomfortable and aching. Has the patient traveled out of the country within the last 30 days? ---No Does the patient have any new or worsening symptoms? ---Yes Will a triage be completed? ---Yes Related visit to physician within the last 2 weeks? ---Yes Does the PT have any chronic conditions? (i.e. diabetes, asthma, etc.) ---Yes List chronic conditions. ---HTN under control with meds Fibromylgia Is this a behavioral health or substance abuse call? ---No Guidelines Guideline Title Affirmed Question Affirmed Notes Nurse Date/Time (Eastern Time) Immunization Reactions [1]  Pain, tenderness, or swelling at the injection site AND [2] persists > 3 days Hammonds, RN, Lissa 08/07/2016 8:21:21 PM Disp. Time Eilene Ghazi Time) Disposition Final User PLEASE NOTE: All timestamps contained within this report are represented as [place] Standard Time. CONFIDENTIALTY NOTICE: This fax transmission is intended only for the addressee. It contains information that is legally privileged, confidential or otherwise protected from use or disclosure. If you are not the intended recipient, you are strictly prohibited from reviewing, disclosing, copying using or disseminating any of this information or taking any action in reliance on or regarding this information. If you have received this fax in error, please notify us immediately by telephone so that we can arrange for its return to [place]. Phone: [phone], Toll-Free: [phone], Fax: [phone] Page: 2 of 2 Call Id: [id number] 08/07/2016 8:25:12 PM See PCP When Office is Open (within 3 days) Yes Hammonds, RN, Epifanio Lesches Caller Understands: Yes Disagree/Comply: Comply Care Advice Given Per Guideline SEE PCP WITHIN 3 DAYS: * You need to be seen within 2 or 3 days. Call your doctor during regular office hours and make an appointment. An urgent care center is often the best source of care if your doctor's office is closed or you can't get an appointment. NOTE: If office will be open tomorrow, tell caller to call then, not in 3 days. PAIN MEDICINES: ACETAMINOPHEN (E.G., TYLENOL): * Take 650 mg (two 325 mg pills) by mouth every 4-6 hours as needed. Each Regular Strength Tylenol pill has 325 mg of acetaminophen. The most you should take each day is 3,250 mg (10 Regular Strength pills a day). CAUTION - NSAIDS (E.G., IBUPROFEN, NAPROXEN): * Do not take nonsteroidal anti-inflammatory drugs (NSAIDs) if  you have stomach problems, kidney disease, heart failure, or other contraindications to using this type of medication. LOCAL HEAT: Apply warm wet  washcloth or a heating pad for 20 minutes four times a day for pain relief. CALL BACK IF: * Fever occurs * You become worse. CARE ADVICE given per Immunization Reactions (Adult) guideline. Referrals REFERRED TO PCP OFFICE

## 2016-08-18 DIAGNOSIS — K5900 Constipation, unspecified: Secondary | ICD-10-CM | POA: Diagnosis not present

## 2016-08-18 DIAGNOSIS — M961 Postlaminectomy syndrome, not elsewhere classified: Secondary | ICD-10-CM | POA: Diagnosis not present

## 2016-08-18 DIAGNOSIS — M62830 Muscle spasm of back: Secondary | ICD-10-CM | POA: Diagnosis not present

## 2016-08-18 DIAGNOSIS — G894 Chronic pain syndrome: Secondary | ICD-10-CM | POA: Diagnosis not present

## 2016-09-01 DIAGNOSIS — M25561 Pain in right knee: Secondary | ICD-10-CM | POA: Diagnosis not present

## 2016-09-15 ENCOUNTER — Telehealth: Payer: Self-pay | Admitting: Family Medicine

## 2016-09-15 NOTE — Telephone Encounter (Signed)
No.  Must have o/v to determine dx and appropriate treatment.

## 2016-09-15 NOTE — Telephone Encounter (Signed)
Pt advised and voiced understanding.  Apt made for 09/16/16 at 2:30pm.

## 2016-09-15 NOTE — Telephone Encounter (Signed)
Patient has sore throat, runny nose, tongue is sore, headache. Can she get a Zpak sent to [facility]?

## 2016-09-15 NOTE — Telephone Encounter (Signed)
Please advise. Thanks.  

## 2016-09-16 ENCOUNTER — Ambulatory Visit (INDEPENDENT_AMBULATORY_CARE_PROVIDER_SITE_OTHER): Payer: Medicare Other | Admitting: Family Medicine

## 2016-09-16 ENCOUNTER — Encounter: Payer: Self-pay | Admitting: Family Medicine

## 2016-09-16 VITALS — BP 134/78 | HR 68 | Temp 97.7°F | Resp 16 | Ht 66.0 in | Wt 186.5 lb

## 2016-09-16 DIAGNOSIS — M961 Postlaminectomy syndrome, not elsewhere classified: Secondary | ICD-10-CM | POA: Diagnosis not present

## 2016-09-16 DIAGNOSIS — K5900 Constipation, unspecified: Secondary | ICD-10-CM | POA: Diagnosis not present

## 2016-09-16 DIAGNOSIS — J019 Acute sinusitis, unspecified: Secondary | ICD-10-CM

## 2016-09-16 DIAGNOSIS — G894 Chronic pain syndrome: Secondary | ICD-10-CM | POA: Diagnosis not present

## 2016-09-16 DIAGNOSIS — M62830 Muscle spasm of back: Secondary | ICD-10-CM | POA: Diagnosis not present

## 2016-09-16 DIAGNOSIS — J0100 Acute maxillary sinusitis, unspecified: Secondary | ICD-10-CM

## 2016-09-16 MED ORDER — AZITHROMYCIN 250 MG PO TABS: ORAL_TABLET | ORAL | 0 refills | Status: DC

## 2016-09-16 MED ORDER — FLUCONAZOLE 150 MG PO TABS: 150.0000 mg | ORAL_TABLET | Freq: Once | ORAL | 0 refills | Status: AC

## 2016-09-16 NOTE — Progress Notes (Signed)
OFFICE VISIT  09/16/2016   CC:  Chief Complaint  Patient presents with  . URI    x 3-4 days   HPI:    Patient is a 70 y.o. Caucasian female who presents for respiratory complaints.  Onset 3 d/a, nasal congestion/runny nose, sinus pressure, peri-orbital HA, ST.  No cough.  No fever.  +Malaise.  No n/v/d. Tried mucinex and sudafed and aspirin for her symptoms.  She got her flu vaccine reaction this season. Prior to last 3-4d of symptoms, she had milder allergic rhinitis vs URI sx's for about a week or so.    Past Medical History:  Diagnosis Date  . Allergic rhinitis   . Anxiety   . Chest pain    cr  . Chronic pain syndrome    right knee osteo, hx of TKA in R knee.  Pt was told by [name] that no further surgical options are available for her--[name]  . Chronic renal insufficiency, stage II (mild) 2015   CrCl in th 60s.  . Diverticulosis of colon    with hx of 'itis  . DJD (degenerative joint disease)    L spine and knees  . FATIGUE / MALAISE 07/01/2009   Chronic  . Fibromyalgia   . GERD (gastroesophageal reflux disease)   . History of colonic polyps   . HTN (hypertension)   . Hypercholesterolemia   . Hypothyroid 07/05/2012  . IBS (irritable bowel syndrome)   . IC (interstitial cystitis)    [name] at [facility]  . Iron deficiency anemia, unspecified 06/27/2013   Has required IV iron on multiple occasions  . Lumbar back pain   . Major depression, recurrent ([place])    vs dysthymia,chronic  . Memory impairment 10/03/2012   Memory impairment 10/03/2012 >referral to neuro    . Monoclonal gammopathy of undetermined significance    IgM kappa MGUS ([name]) routine f/u has shown stability of this problem.  . OSA (obstructive sleep apnea)    does not have CPAP; Dr. Halford Chessman  . Osteopenia 04/2014   T score -2.0 FRAX 11%/1.8%  . Otitis externa, candida 21/3086   [name] tx'd her with clotrimazole 1% external solution x 1 mo  . Palpitations   .  Psoriasis   . Vitamin D deficiency   . Xerostomia    and xerophthalmia--per [name] (he suggests SSA, SSB, ESR, rh factor, ANA, and antimicrosmal and antithyroglobulin ab's as of 02/17/16)    Past Surgical History:  Procedure Laterality Date  . ABDOMINAL HYSTERECTOMY     for prolapse with abdominal incision  . BACK SURGERY  [id number]   L2-3 fusion ([name])  . CARDIAC CATHETERIZATION     no PCI  . CHOLECYSTECTOMY    . COLONOSCOPY  04/2006   Diverticulosis, o/w normal.  . ESOPHAGOGASTRODUODENOSCOPY  05/29/2009   Esoph normal.  Gastritis (H pylori NEG) + gastric polyps (benign).  Duodenal biopsy NORMAL.  [name] EYE SURGERY Left    "lazy eye"  . HAND SURGERY Right    TRIGGER FINGER; index finger  . HERNIA REPAIR     umbilical  . KNEE ARTHROSCOPY     RIGHT KNEE X 5  . LAMINOTOMY  2010   L-4, L-5 FUSION  . NASAL SINUS SURGERY     polypectomy (remote past)  . OOPHORECTOMY  2010   Laparoscopic BSO (path benign)  . PATELLA FRACTURE SURGERY  10-08   [name]  . POSTERIOR LAMINECTOMY / DECOMPRESSION LUMBAR SPINE  9-09  [name]  . TOTAL KNEE ARTHROPLASTY  12-07 and 4-07   [name], revision 12-09 [name] at [facility]    Outpatient Medications Prior to Visit  Medication Sig Dispense Refill  . ALPRAZolam (XANAX) 0.5 MG tablet TAKE ONE-HALF TO ONE TABLET BY MOUTH THREE TIMES DAILY 90 tablet 5  . Azelastine HCl (ASTEPRO) 0.15 % SOLN 2 sprays each nostril every 12 hours (Patient taking differently: Place 2 sprays into the nose 2 (two) times daily as needed (Allergies). 2 sprays each nostril every 12 hours) 30 mL 11  . calcium carbonate (OSCAL) 1500 (600 Ca) MG TABS tablet Take by mouth daily.    . Cholecalciferol (VITAMIN D) 2000 UNITS CAPS Take 5 capsules by mouth daily.     . cyclobenzaprine (FLEXERIL) 5 MG tablet     . diphenhydrAMINE (BENADRYL) 25 MG tablet Take 25 mg by mouth at bedtime as needed.    . fish oil-omega-3 fatty acids 1000 MG capsule Take 2 g by mouth daily.     .  hydrocortisone 2.5 % cream Apply 1 application topically as needed. RECTAL ITCHING    . Inositol Niacinate 500 MG CAPS Take 1 capsule by mouth at bedtime.    [name] levothyroxine (SYNTHROID, LEVOTHROID) 75 MCG tablet TAKE ONE-HALF TABLET BY MOUTH ONCE DAILY 45 tablet 1  . meclizine (ANTIVERT) 25 MG tablet Take 1 tablet (25 mg total) by mouth every 6 (six) hours as needed for dizziness. 360 tablet 3  . meloxicam (MOBIC) 7.5 MG tablet Take 2 tablets (15 mg total) by mouth daily. 180 tablet 1  . metoprolol tartrate (LOPRESSOR) 25 MG tablet TAKE ONE TABLET BY MOUTH ONCE DAILY 90 tablet 1  . Multiple Vitamins-Iron (MULTIVITAMIN/IRON PO) Take 1 tablet by mouth daily.     [name] ofloxacin (FLOXIN) 0.3 % otic solution Place 10 drops into the right ear daily. 5 mL 0  . Oxycodone HCl 10 MG TABS 10 mg every 4 (four) hours as needed. 1 every 6 hours for pain    . promethazine (PHENERGAN) 25 MG tablet Take 1 tablet (25 mg total) by mouth every 6 (six) hours as needed for nausea or vomiting. 90 tablet 3  . simvastatin (ZOCOR) 40 MG tablet Take 1 tablet (40 mg total) by mouth at bedtime. 90 tablet 3  . Specialty Vitamins Products (MAGNESIUM, AMINO ACID CHELATE,) 133 MG tablet Take 1 tablet by mouth daily.    [name] azithromycin (ZITHROMAX) 250 MG tablet 2 tabs po qd x 1d, then 1 tab po qd x 4d (Patient not taking: Reported on 09/16/2016) 6 tablet 0  . chlorhexidine (PERIDEX) 0.12 % solution Use as directed 15 mLs in the mouth or throat 2 (two) times daily. (Patient not taking: Reported on 09/16/2016) 120 mL 1  . fluconazole (DIFLUCAN) 150 MG tablet Take one tab, then repeat 3 days. (Patient not taking: Reported on 09/16/2016) 2 tablet 0   No facility-administered medications prior to visit.     Allergies  Allergen Reactions  . Cymbalta [Duloxetine Hcl]     Facial edema  . Iohexol      Code: HIVES, Desc: pt states her face and throat swells when administered IV contrast - KB, Onset Date: [id number]   . Mepergan  [Meperidine-Promethazine]     Cardiac arrest  . Morphine Rash  . Augmentin [Amoxicillin-Pot Clavulanate] Nausea Only  . Citalopram Other (See Comments)    Jittery/heart racing   . Hctz [Hydrochlorothiazide] Other (See Comments)    Too much urinating, felt dried  out, etc--NO ALLERGY  . Lexapro [Escitalopram Oxalate] Other (See Comments)    Jittery,heart racing  . Lyrica [Pregabalin] Swelling    Face, hands, fingers, ankles  . Valsartan     REACTION: pt states INTOL to Diovan    ROS As per HPI  PE: Blood pressure 134/78, pulse 68, temperature 97.7 F (36.5 C), temperature source Oral, resp. rate 16, height _0  (1.676 m), weight 186 lb 8 oz (84.6 kg), SpO2 95 %. VS: noted--normal. Gen: alert, NAD, NONTOXIC APPEARING. HEENT: eyes without injection, drainage, or swelling.  Ears: EACs clear, TMs with normal light reflex and landmarks.  Nose: Clear rhinorrhea, with some dried, crusty exudate adherent to mildly injected mucosa.  No purulent d/c.  Diffuse paranasal sinus TTP, worse over ethmoids region and L maxillary region.  No facial swelling.  Throat and mouth without focal lesion.  No pharyngial swelling, erythema, or exudate.   Neck: supple, no LAD.   LUNGS: CTA bilat, nonlabored resps.   CV: RRR, no m/r/g. EXT: no c/c/e SKIN: no rash  LABS:  None today  IMPRESSION AND PLAN:  Acute sinusitis: continue symptomatic care, add Z-pack. Add saline nasal spray 2-3 times per day.  An After Visit Summary was printed and given to the patient.  FOLLOW UP: Return if symptoms worsen or fail to improve.  Signed:  Crissie Sickles, MD           09/16/2016

## 2016-09-16 NOTE — Progress Notes (Signed)
Pre visit review using our clinic review tool, if applicable. No additional management support is needed unless otherwise documented below in the visit note. 

## 2016-09-30 ENCOUNTER — Telehealth: Payer: Self-pay | Admitting: Family Medicine

## 2016-09-30 MED ORDER — CEFDINIR 300 MG PO CAPS: 300.0000 mg | ORAL_CAPSULE | Freq: Two times a day (BID) | ORAL | 0 refills | Status: DC

## 2016-09-30 NOTE — Telephone Encounter (Signed)
Patient states she is still very hoarse and her eyes / forehead are hurting.  Patient wants to know if there is anyway you can send in different antibiotic?  Please advise.   ( I advised patient that she may need appointment )

## 2016-09-30 NOTE — Telephone Encounter (Signed)
New antibiotic (cefdinir) eRx'd to her local pharmacy.-thx

## 2016-09-30 NOTE — Telephone Encounter (Signed)
Patient advised and expressed understanding.

## 2016-10-01 ENCOUNTER — Encounter: Payer: Medicare Other | Admitting: Gynecology

## 2016-10-15 DIAGNOSIS — K5900 Constipation, unspecified: Secondary | ICD-10-CM | POA: Diagnosis not present

## 2016-10-15 DIAGNOSIS — G894 Chronic pain syndrome: Secondary | ICD-10-CM | POA: Diagnosis not present

## 2016-10-15 DIAGNOSIS — M62830 Muscle spasm of back: Secondary | ICD-10-CM | POA: Diagnosis not present

## 2016-10-15 DIAGNOSIS — M961 Postlaminectomy syndrome, not elsewhere classified: Secondary | ICD-10-CM | POA: Diagnosis not present

## 2016-10-19 ENCOUNTER — Telehealth: Payer: Self-pay | Admitting: Family Medicine

## 2016-10-19 DIAGNOSIS — G4733 Obstructive sleep apnea (adult) (pediatric): Secondary | ICD-10-CM

## 2016-10-19 DIAGNOSIS — G4710 Hypersomnia, unspecified: Secondary | ICD-10-CM

## 2016-10-19 NOTE — Telephone Encounter (Signed)
Please advise. Thanks.  

## 2016-10-19 NOTE — Telephone Encounter (Signed)
Pt asking for referral to have a sleep study done.

## 2016-10-19 NOTE — Telephone Encounter (Signed)
Referral ordered as per pt request. 

## 2016-10-20 NOTE — Telephone Encounter (Signed)
Patient notified and verbalized understanding. 

## 2016-10-25 ENCOUNTER — Telehealth: Payer: Self-pay | Admitting: *Deleted

## 2016-10-25 NOTE — Telephone Encounter (Signed)
Patient is calling c/o nausea and vomiting each morning for several weeks along with frequent diarrhea. She wants to be seen for assessment.   This patient is treated in this office for MGUS and Iron deficiency anemia. Spoke with Laverna Peace NP and she is deferring patient to her PCP since symptoms are unrelated to her problems treated by this office.  Patient aware and understanding. She will call her PCP.

## 2016-10-26 ENCOUNTER — Encounter: Payer: Self-pay | Admitting: Family Medicine

## 2016-10-26 ENCOUNTER — Other Ambulatory Visit: Payer: Medicare Other

## 2016-10-26 ENCOUNTER — Ambulatory Visit (INDEPENDENT_AMBULATORY_CARE_PROVIDER_SITE_OTHER): Payer: Medicare Other | Admitting: Family Medicine

## 2016-10-26 ENCOUNTER — Ambulatory Visit: Payer: Medicare Other | Admitting: Family

## 2016-10-26 VITALS — BP 133/85 | HR 61 | Temp 97.9°F | Resp 20 | Wt 188.2 lb

## 2016-10-26 DIAGNOSIS — R8290 Unspecified abnormal findings in urine: Secondary | ICD-10-CM | POA: Diagnosis not present

## 2016-10-26 DIAGNOSIS — R195 Other fecal abnormalities: Secondary | ICD-10-CM

## 2016-10-26 DIAGNOSIS — R110 Nausea: Secondary | ICD-10-CM

## 2016-10-26 DIAGNOSIS — R194 Change in bowel habit: Secondary | ICD-10-CM | POA: Diagnosis not present

## 2016-10-26 LAB — POC URINALSYSI DIPSTICK (AUTOMATED)
BILIRUBIN UA: NEGATIVE
GLUCOSE UA: NEGATIVE
KETONES UA: NEGATIVE
Nitrite, UA: NEGATIVE
Protein, UA: NEGATIVE
SPEC GRAV UA: 1.01
UROBILINOGEN UA: 0.2
pH, UA: 5

## 2016-10-26 MED ORDER — ONDANSETRON HCL 4 MG PO TABS: 4.0000 mg | ORAL_TABLET | Freq: Three times a day (TID) | ORAL | 0 refills | Status: DC | PRN

## 2016-10-26 MED ORDER — DICYCLOMINE HCL 10 MG PO CAPS: 10.0000 mg | ORAL_CAPSULE | Freq: Three times a day (TID) | ORAL | 0 refills | Status: DC

## 2016-10-26 MED ORDER — ALIGN 4 MG PO CAPS: 1.0000 | ORAL_CAPSULE | Freq: Every day | ORAL | 0 refills | Status: DC

## 2016-10-26 NOTE — Progress Notes (Signed)
[name] , 1945/09/27, 71 y.o., female MRN: [id number] Patient Care Team    Relationship Specialty Notifications Start End  [name], MD PCP - General Family Medicine  12/17/13    Comment: Transfer from Dr. Lenna Gilford (Merged)  Chesley Mires, MD Consulting Physician Pulmonary Disease  12/17/13   Bjorn Loser, MD Consulting Physician Urology  12/17/13   Bo Merino, MD Consulting Physician Rheumatology  12/17/13   Anastasio Auerbach, MD Consulting Physician Gynecology  12/17/13   Almedia Balls, MD Consulting Physician Orthopedic Surgery  12/17/13   [name], MD Consulting Physician Oncology  6/55/37   Jodi Marble, MD Consulting Physician Otolaryngology  02/19/16   Renato Shin, MD Consulting Physician Endocrinology  02/19/16   Eliezer Bottom, NP Nurse Practitioner Hematology and Oncology  05/26/16   Macarthur Critchley, [place] Referring Physician Optometry  08/02/16   Lacretia Leigh, DMD  Dentistry  08/02/16     CC: Diarrhea Subjective: Pt presents for an acute OV with complaints of frequent stools of 2 weeks duration.  Pt was treated for an infection with zpack and then omnicef and stool changes followed.  She states it is not truly diarrhea, but more frequent stools up to 4x a day that is associated with nausea. She states the nausea is worse when she is having the BM. She endorses cramping and fecal urgency. She denies dysuria or urinary frequnecy.She denies  fever, chills or vomit. She denies melana/hematochezia, foul odor or changes in stool color. She has tried the [facility] anti-diarrheal medication and imodium, neither worked. She reports IBS symptoms many years ago in the past, but never took medications for it. She had a colonoscopy ~ 10 years ago with diverticulosis only.   Allergies  Allergen Reactions  . Cymbalta [Duloxetine Hcl]     Facial edema  . Iohexol      Code: HIVES, Desc: pt states her face and throat swells when administered IV contrast - KB, Onset Date:  [id number]   . Mepergan [Meperidine-Promethazine]     Cardiac arrest  . Morphine Rash  . Augmentin [Amoxicillin-Pot Clavulanate] Nausea Only  . Citalopram Other (See Comments)    Jittery/heart racing   . Hctz [Hydrochlorothiazide] Other (See Comments)    Too much urinating, felt dried out, etc--NO ALLERGY  . Lexapro [Escitalopram Oxalate] Other (See Comments)    Jittery,heart racing  . Lyrica [Pregabalin] Swelling    Face, hands, fingers, ankles  . Valsartan     REACTION: pt states INTOL to Diovan   Social History  Substance Use Topics  . Smoking status: Never Smoker  . Smokeless tobacco: Never Used     Comment: NEVER USED TOBACCO  . Alcohol use No   Past Medical History:  Diagnosis Date  . Allergic rhinitis   . Anxiety   . Chest pain    cr  . Chronic pain syndrome    right knee osteo, hx of TKA in R knee.  Pt was told by [name] that no further surgical options are available for her--[name]  . Chronic renal insufficiency, stage II (mild) 2015   CrCl in th 60s.  . Diverticulosis of colon    with hx of 'itis  . DJD (degenerative joint disease)    L spine and knees  . FATIGUE / MALAISE 07/01/2009   Chronic  . Fibromyalgia   . GERD (gastroesophageal reflux disease)   . History of colonic polyps   . HTN (hypertension)   .  Hypercholesterolemia   . Hypothyroid 07/05/2012  . IBS (irritable bowel syndrome)   . IC (interstitial cystitis)    [name] at [place]  . Iron deficiency anemia, unspecified 06/27/2013   Has required IV iron on multiple occasions  . Lumbar back pain   . Major depression, recurrent ([place])    vs dysthymia,chronic  . Memory impairment 10/03/2012   Memory impairment 10/03/2012 >referral to neuro    . Monoclonal gammopathy of undetermined significance    IgM kappa MGUS ([name]) routine f/u has shown stability of this problem.  . OSA (obstructive sleep apnea)    does not have CPAP; Dr. Halford Chessman  . Osteopenia 04/2014   T  score -2.0 FRAX 11%/1.8%  . Otitis externa, candida 82/9562   [name] tx'd her with clotrimazole 1% external solution x 1 mo  . Palpitations   . Psoriasis   . Vitamin D deficiency   . Xerostomia    and xerophthalmia--per [name] (he suggests SSA, SSB, ESR, rh factor, ANA, and antimicrosmal and antithyroglobulin ab's as of 02/17/16)   Past Surgical History:  Procedure Laterality Date  . ABDOMINAL HYSTERECTOMY     for prolapse with abdominal incision  . BACK SURGERY  [id number]   L2-3 fusion ([name])  . CARDIAC CATHETERIZATION     no PCI  . CHOLECYSTECTOMY    . COLONOSCOPY  04/2006   Diverticulosis, o/w normal.  . ESOPHAGOGASTRODUODENOSCOPY  05/29/2009   Esoph normal.  Gastritis (H pylori NEG) + gastric polyps (benign).  Duodenal biopsy NORMAL.  [name] EYE SURGERY Left    "lazy eye"  . HAND SURGERY Right    TRIGGER FINGER; index finger  . HERNIA REPAIR     umbilical  . KNEE ARTHROSCOPY     RIGHT KNEE X 5  . LAMINOTOMY  2010   L-4, L-5 FUSION  . NASAL SINUS SURGERY     polypectomy (remote past)  . OOPHORECTOMY  2010   Laparoscopic BSO (path benign)  . PATELLA FRACTURE SURGERY  10-08   [name]  . POSTERIOR LAMINECTOMY / DECOMPRESSION LUMBAR SPINE  9-09   [name]  . TOTAL KNEE ARTHROPLASTY  12-07 and 4-07   [name], revision 12-09 [name] at [facility]   Family History  Problem Relation Age of Onset  . Leukemia Brother   . Lung disease Brother   . Melanoma Brother   . Hypertension Mother   . Thyroid disease Mother   . Hypertension Father   . Breast cancer Sister     Age 72's  . Hypertension Sister   . Coronary artery disease Sister   . Lupus Sister   . Thyroid disease Sister   . Thyroid disease Brother   . Cancer Other    Allergies as of 10/26/2016      Reactions   Cymbalta [duloxetine Hcl]    Facial edema   Iohexol     Code: HIVES, Desc: pt states her face and throat swells when administered IV contrast - KB, Onset Date: [id number]   Mepergan  [meperidine-promethazine]    Cardiac arrest   Morphine Rash   Augmentin [amoxicillin-pot Clavulanate] Nausea Only   Citalopram Other (See Comments)   Jittery/heart racing   Hctz [hydrochlorothiazide] Other (See Comments)   Too much urinating, felt dried out, etc--NO ALLERGY   Lexapro [escitalopram Oxalate] Other (See Comments)   Jittery,heart racing   Lyrica [pregabalin] Swelling   Face, hands, fingers, ankles   Valsartan    REACTION: pt states INTOL  to Diovan      Medication List       Accurate as of 10/26/16  1:55 PM. Always use your most recent med list.          ALPRAZolam 0.5 MG tablet Commonly known as:  XANAX TAKE ONE-HALF TO ONE TABLET BY MOUTH THREE TIMES DAILY   Azelastine HCl 0.15 % Soln Commonly known as:  ASTEPRO 2 sprays each nostril every 12 hours   calcium carbonate 1500 (600 Ca) MG Tabs tablet Commonly known as:  OSCAL Take by mouth daily.   cefdinir 300 MG capsule Commonly known as:  OMNICEF Take 1 capsule (300 mg total) by mouth 2 (two) times daily.   cyclobenzaprine 5 MG tablet Commonly known as:  FLEXERIL   diphenhydrAMINE 25 MG tablet Commonly known as:  BENADRYL Take 25 mg by mouth at bedtime as needed.   fish oil-omega-3 fatty acids 1000 MG capsule Take 2 g by mouth daily.   hydrocortisone 2.5 % cream Apply 1 application topically as needed. RECTAL ITCHING   Inositol Niacinate 500 MG Caps Take 1 capsule by mouth at bedtime.   levothyroxine 75 MCG tablet Commonly known as:  SYNTHROID, LEVOTHROID TAKE ONE-HALF TABLET BY MOUTH ONCE DAILY   magnesium (amino acid chelate) 133 MG tablet Take 1 tablet by mouth daily.   meclizine 25 MG tablet Commonly known as:  ANTIVERT Take 1 tablet (25 mg total) by mouth every 6 (six) hours as needed for dizziness.   meloxicam 7.5 MG tablet Commonly known as:  MOBIC Take 2 tablets (15 mg total) by mouth daily.   metoprolol tartrate 25 MG tablet Commonly known as:  LOPRESSOR TAKE ONE TABLET BY  MOUTH ONCE DAILY   MULTIVITAMIN/IRON PO Take 1 tablet by mouth daily.   ofloxacin 0.3 % otic solution Commonly known as:  FLOXIN Place 10 drops into the right ear daily.   Oxycodone HCl 10 MG Tabs 10 mg every 4 (four) hours as needed. 1 every 6 hours for pain   promethazine 25 MG tablet Commonly known as:  PHENERGAN Take 1 tablet (25 mg total) by mouth every 6 (six) hours as needed for nausea or vomiting.   simvastatin 40 MG tablet Commonly known as:  ZOCOR Take 1 tablet (40 mg total) by mouth at bedtime.   Vitamin D 2000 units Caps Take 5 capsules by mouth daily.       No results found for this or any previous visit (from the past 24 hour(s)). No results found.   ROS: Negative, with the exception of above mentioned in HPI   Objective:  BP 133/85 (BP Location: Left Arm, Patient Position: Sitting, Cuff Size: Large)   Pulse 61   Temp 97.9 F (36.6 C)   Resp 20   Wt 188 lb 4 oz (85.4 kg)   SpO2 96%   BMI 30.38 kg/m  Body mass index is 30.38 kg/m. Gen: Afebrile. No acute distress. Nontoxic in appearance, well developed, well nourished.  HENT: AT. [place]. MMM Eyes:Pupils Equal Round Reactive to light, Extraocular movements intact,  Conjunctiva without redness, discharge or icterus. CV: RRR, no edema Chest: CTAB, no wheeze or crackles. Good air movement, normal resp effort.  Abd: Soft. obese. NTND. BS present/normal. no Masses palpated. No rebound or guarding.  Skin: no rashes, purpura or petechiae.  Neuro: Normal gait (walks with cane). PERLA. EOMi. Alert. Oriented x3  Psych: Normal affect, dress and demeanor. Normal speech. Normal thought content and judgment.  Results for orders placed or  performed in visit on 10/26/16 (from the past 24 hour(s))  POCT Urinalysis Dipstick (Automated)     Status: Abnormal   Collection Time: 10/26/16  2:10 PM  Result Value Ref Range   Color, UA yellow    Clarity, UA clear    Glucose, UA Negative    Bilirubin, UA Negative     Ketones, UA Negative    Spec Grav, UA 1.010    Blood, UA trace    pH, UA 5.0    Protein, UA Negative    Urobilinogen, UA 0.2    Nitrite, UA Negative    Leukocytes, UA Trace (A) Negative    Assessment/Plan: [name] is a 71 y.o. female present for acute OV for  Nausea/Abnomral urine - Start zofran as needed for nausea only.  - urine culture - POCT Urinalysis Dipstick (Automated) - Clostridium Difficile by PCR; Future - dicyclomine (BENTYL) 10 MG capsule; Take 1 capsule (10 mg total) by mouth 4 (four) times daily -  before meals and at bedtime.  Dispense: 120 capsule; Refill: 0  Bowel habit changes/Loose stools - no red flags on exam. ? IBS flare (prior history), vs C.diff (less likely but can not r/o with recent abx use prior to symptoms) - Clostridium Difficile by PCR; Future - dicyclomine (BENTYL) 10 MG capsule; Take 1 capsule (10 mg total) by mouth 4 (four) times daily -  before meals and at bedtime.  Dispense: 120 capsule; Refill: 0 - complete stool sample and bring back in for testing.  - Start bentyl to calm down gut. Pt has h/o IBS symptoms in the past.  - Start probiotic align for at least 8 weeks.  - Stop magnesium or cut back,  for now. May restart once BM normalize.  - Try to consume cottage cheese/yogurt  to help flora in gut.  - pt will be called with results, if worsening symptoms or unresolved in 1-2 weeks please follow with pcp.   electronically signed by:  Howard Pouch, DO  [place]

## 2016-10-26 NOTE — Patient Instructions (Signed)
Please complete stool sample and bring back in for testing.  Start zofran as needed for nausea only.  Start bentyl to calm down gut, once starting to improve cut back on doses until off medicine.  Start probiotic align.  Stop magnesium, or for now. May restart once BM normalize.  I will send your urine to culture to be sure not infectious. You had a small amount of blood and WBC in it today.  Try to eat cottage cheese/yogurt also to help flora in gut.  We will call you with results, if worsening symptoms or unresolved in 1-2 weeks please follow with pcp.

## 2016-10-27 LAB — URINE CULTURE: Organism ID, Bacteria: NO GROWTH

## 2016-10-28 ENCOUNTER — Telehealth: Payer: Self-pay | Admitting: Family Medicine

## 2016-10-28 ENCOUNTER — Telehealth: Payer: Self-pay

## 2016-10-28 NOTE — Telephone Encounter (Signed)
Patient notified and verbalized understanding. She will complete stool studies and turn in when done.

## 2016-10-28 NOTE — Telephone Encounter (Signed)
Please call pt: - I sent her urine for culture to make certain not part of cause of nausea. Her urine had no signs of infection. If she is still have nausea/losse stools, please encourage her to complete the C.Diff kit for testing. Thanks.

## 2016-10-28 NOTE — Telephone Encounter (Signed)
Message left on voice mail for patient to return call. 

## 2016-10-29 DIAGNOSIS — R110 Nausea: Secondary | ICD-10-CM | POA: Diagnosis not present

## 2016-10-29 DIAGNOSIS — R195 Other fecal abnormalities: Secondary | ICD-10-CM | POA: Diagnosis not present

## 2016-10-29 DIAGNOSIS — R194 Change in bowel habit: Secondary | ICD-10-CM | POA: Diagnosis not present

## 2016-10-29 NOTE — Telephone Encounter (Signed)
Opened in error

## 2016-10-29 NOTE — Addendum Note (Signed)
Addended by: Ralph Dowdy on: 10/29/2016 01:09 PM   Modules accepted: Orders

## 2016-10-31 LAB — CLOSTRIDIUM DIFFICILE BY PCR: CDIFFPCR: NOT DETECTED

## 2016-11-01 ENCOUNTER — Telehealth: Payer: Self-pay | Admitting: Family Medicine

## 2016-11-01 NOTE — Telephone Encounter (Signed)
Please call pt: Her c.diif testing is negative.

## 2016-11-01 NOTE — Telephone Encounter (Signed)
Left message with results on patient voice mail per DPR 

## 2016-11-04 ENCOUNTER — Encounter: Payer: Medicare Other | Admitting: Gynecology

## 2016-11-15 DIAGNOSIS — M961 Postlaminectomy syndrome, not elsewhere classified: Secondary | ICD-10-CM | POA: Diagnosis not present

## 2016-11-15 DIAGNOSIS — Z79891 Long term (current) use of opiate analgesic: Secondary | ICD-10-CM | POA: Diagnosis not present

## 2016-11-15 DIAGNOSIS — F329 Major depressive disorder, single episode, unspecified: Secondary | ICD-10-CM | POA: Diagnosis not present

## 2016-11-15 DIAGNOSIS — M4807 Spinal stenosis, lumbosacral region: Secondary | ICD-10-CM | POA: Diagnosis not present

## 2016-11-15 DIAGNOSIS — M1712 Unilateral primary osteoarthritis, left knee: Secondary | ICD-10-CM | POA: Diagnosis not present

## 2016-11-15 DIAGNOSIS — M62830 Muscle spasm of back: Secondary | ICD-10-CM | POA: Diagnosis not present

## 2016-11-15 DIAGNOSIS — G4700 Insomnia, unspecified: Secondary | ICD-10-CM | POA: Diagnosis not present

## 2016-11-15 DIAGNOSIS — M47817 Spondylosis without myelopathy or radiculopathy, lumbosacral region: Secondary | ICD-10-CM | POA: Diagnosis not present

## 2016-11-15 DIAGNOSIS — K5900 Constipation, unspecified: Secondary | ICD-10-CM | POA: Diagnosis not present

## 2016-11-15 DIAGNOSIS — G894 Chronic pain syndrome: Secondary | ICD-10-CM | POA: Diagnosis not present

## 2016-11-19 DIAGNOSIS — M7061 Trochanteric bursitis, right hip: Secondary | ICD-10-CM | POA: Diagnosis not present

## 2016-11-25 ENCOUNTER — Other Ambulatory Visit: Payer: BLUE CROSS/BLUE SHIELD

## 2016-11-25 ENCOUNTER — Ambulatory Visit: Payer: BLUE CROSS/BLUE SHIELD | Admitting: Hematology & Oncology

## 2016-11-25 ENCOUNTER — Ambulatory Visit (HOSPITAL_BASED_OUTPATIENT_CLINIC_OR_DEPARTMENT_OTHER): Payer: Medicare Other | Admitting: Family

## 2016-11-25 ENCOUNTER — Other Ambulatory Visit (HOSPITAL_BASED_OUTPATIENT_CLINIC_OR_DEPARTMENT_OTHER): Payer: Medicare Other

## 2016-11-25 VITALS — BP 142/68 | HR 64 | Temp 97.8°F | Resp 18 | Wt 186.5 lb

## 2016-11-25 DIAGNOSIS — D472 Monoclonal gammopathy: Secondary | ICD-10-CM | POA: Diagnosis not present

## 2016-11-25 DIAGNOSIS — D509 Iron deficiency anemia, unspecified: Secondary | ICD-10-CM

## 2016-11-25 DIAGNOSIS — M545 Low back pain: Secondary | ICD-10-CM

## 2016-11-25 DIAGNOSIS — M25561 Pain in right knee: Secondary | ICD-10-CM

## 2016-11-25 DIAGNOSIS — D508 Other iron deficiency anemias: Secondary | ICD-10-CM

## 2016-11-25 LAB — CBC WITH DIFFERENTIAL (CANCER CENTER ONLY)
BASO#: 0 10*3/uL (ref 0.0–0.2)
BASO%: 0.3 % (ref 0.0–2.0)
EOS ABS: 0.4 10*3/uL (ref 0.0–0.5)
EOS%: 4.2 % (ref 0.0–7.0)
HCT: 44.1 % (ref 34.8–46.6)
HEMOGLOBIN: 15.2 g/dL (ref 11.6–15.9)
LYMPH#: 2.7 10*3/uL (ref 0.9–3.3)
LYMPH%: 27.6 % (ref 14.0–48.0)
MCH: 30.1 pg (ref 26.0–34.0)
MCHC: 34.5 g/dL (ref 32.0–36.0)
MCV: 87 fL (ref 81–101)
MONO#: 0.6 10*3/uL (ref 0.1–0.9)
MONO%: 5.8 % (ref 0.0–13.0)
NEUT%: 62.1 % (ref 39.6–80.0)
NEUTROS ABS: 6.1 10*3/uL (ref 1.5–6.5)
PLATELETS: 210 10*3/uL (ref 145–400)
RBC: 5.05 10*6/uL (ref 3.70–5.32)
RDW: 13 % (ref 11.1–15.7)
WBC: 9.8 10*3/uL (ref 3.9–10.0)

## 2016-11-25 LAB — COMPREHENSIVE METABOLIC PANEL
ALBUMIN: 4 g/dL (ref 3.5–5.0)
ALT: 30 U/L (ref 0–55)
AST: 24 U/L (ref 5–34)
Alkaline Phosphatase: 99 U/L (ref 40–150)
Anion Gap: 8 mEq/L (ref 3–11)
BUN: 15.3 mg/dL (ref 7.0–26.0)
CHLORIDE: 105 meq/L (ref 98–109)
CO2: 25 meq/L (ref 22–29)
Calcium: 9.2 mg/dL (ref 8.4–10.4)
Creatinine: 0.8 mg/dL (ref 0.6–1.1)
EGFR: 71 mL/min/{1.73_m2} — AB (ref >90)
GLUCOSE: 111 mg/dL (ref 70–140)
POTASSIUM: 3.8 meq/L (ref 3.5–5.1)
SODIUM: 138 meq/L (ref 136–145)
Total Bilirubin: 0.61 mg/dL (ref 0.20–1.20)
Total Protein: 7.6 g/dL (ref 6.4–8.3)

## 2016-11-25 LAB — IRON AND TIBC
%SAT: 23 % (ref 21–57)
Iron: 67 ug/dL (ref 41–142)
TIBC: 295 ug/dL (ref 236–444)
UIBC: 228 ug/dL (ref 120–384)

## 2016-11-25 LAB — FERRITIN: Ferritin: 176 ng/ml (ref 9–269)

## 2016-11-25 NOTE — Progress Notes (Addendum)
Hematology and Oncology Follow Up Visit  [name] [id number] 14-Feb-1946 71 y.o. 11/25/2016   Principle Diagnosis:  1. IgM kappa monoclonal gammopathy of undetermined significance 2. Intermittent iron deficiency anemia  Current Therapy:   IV iron as indicated - last received in April 2017    Interim History: [name] is is here today for a follow-up. She is doing fairly well and has gotten over a recent bout with a stomach virus that included n/v/d. She is feeling better and no longer symptomatic.  He only complaint at this time is depression. Her husband is a verbally abusive alcoholic. She denies any feelings of self harm or harming others. She states that she has tried multiple different antidepressants with her pain management doctor with no improvement. She would like to talk to a councilor but is having trouble committing to one.   We spent a lot of time talking talking today and she states that she just needed "to vent". She is thankful for her best friend and sister who have both been a great support for her.  No anemia. WBC count is 9.8. Iron studies are pending but have been stable for quite a while.  Her proteins studies have remained stable so far. M-spike was 0.5 g/dL, IgG level was 577 mg/dLand kappa light chain was 16.3 mg/L No fever, chills, n/v, cough, rash, dizziness, SOB, chest pain, palpitations, abdominal pain or changes in bowel or bladder habits. She takes Mirilax for occasional constipation as needed.  No lymphadenopathy found on exam. No episodes of bleeding or bruising.  The chronic arthritic pain in her right knee and lower back is unchanged. She uses a cane when ambulating and has had no falls or syncopal episodes.  No swelling, numbness or tingling in her extremities. No new aches or pains.  She has maintained a good appetite and is staying well hydrated. Her weight is stable.   Medications:  Allergies as of 11/25/2016      Reactions   Cymbalta  [duloxetine Hcl]    Facial edema   Iohexol     Code: HIVES, Desc: pt states her face and throat swells when administered IV contrast - KB, Onset Date: [id number]   Mepergan [meperidine-promethazine]    Cardiac arrest   Morphine Rash   Augmentin [amoxicillin-pot Clavulanate] Nausea Only   Citalopram Other (See Comments)   Jittery/heart racing   Hctz [hydrochlorothiazide] Other (See Comments)   Too much urinating, felt dried out, etc--NO ALLERGY   Lexapro [escitalopram Oxalate] Other (See Comments)   Jittery,heart racing   Lyrica [pregabalin] Swelling   Face, hands, fingers, ankles   Valsartan    REACTION: pt states INTOL to Diovan      Medication List       Accurate as of 11/25/16 11:44 AM. Always use your most recent med list.          ALIGN 4 MG Caps Take 1 capsule (4 mg total) by mouth daily.   ALPRAZolam 0.5 MG tablet Commonly known as:  XANAX TAKE ONE-HALF TO ONE TABLET BY MOUTH THREE TIMES DAILY   Azelastine HCl 0.15 % Soln Commonly known as:  ASTEPRO 2 sprays each nostril every 12 hours   calcium carbonate 1500 (600 Ca) MG Tabs tablet Commonly known as:  OSCAL Take by mouth daily.   cyclobenzaprine 5 MG tablet Commonly known as:  FLEXERIL   dicyclomine 10 MG capsule Commonly known as:  BENTYL Take 1 capsule (10 mg total) by mouth 4 (four) times daily -  before meals and at bedtime.   diphenhydrAMINE 25 MG tablet Commonly known as:  BENADRYL Take 25 mg by mouth at bedtime as needed.   fish oil-omega-3 fatty acids 1000 MG capsule Take 2 g by mouth daily.   hydrocortisone 2.5 % cream Apply 1 application topically as needed. RECTAL ITCHING   Inositol Niacinate 500 MG Caps Take 1 capsule by mouth at bedtime.   levothyroxine 75 MCG tablet Commonly known as:  SYNTHROID, LEVOTHROID TAKE ONE-HALF TABLET BY MOUTH ONCE DAILY   magnesium (amino acid chelate) 133 MG tablet Take 1 tablet by mouth daily.   meclizine 25 MG tablet Commonly known as:   ANTIVERT Take 1 tablet (25 mg total) by mouth every 6 (six) hours as needed for dizziness.   meloxicam 7.5 MG tablet Commonly known as:  MOBIC Take 2 tablets (15 mg total) by mouth daily.   metoprolol tartrate 25 MG tablet Commonly known as:  LOPRESSOR TAKE ONE TABLET BY MOUTH ONCE DAILY   MULTIVITAMIN/IRON PO Take 1 tablet by mouth daily.   ondansetron 4 MG tablet Commonly known as:  ZOFRAN Take 1 tablet (4 mg total) by mouth every 8 (eight) hours as needed for nausea or vomiting.   Oxycodone HCl 10 MG Tabs 10 mg every 4 (four) hours as needed. 1 every 6 hours for pain   promethazine 25 MG tablet Commonly known as:  PHENERGAN Take 1 tablet (25 mg total) by mouth every 6 (six) hours as needed for nausea or vomiting.   simvastatin 40 MG tablet Commonly known as:  ZOCOR Take 1 tablet (40 mg total) by mouth at bedtime.   Vitamin D 2000 units Caps Take 5 capsules by mouth daily.       Allergies:  Allergies  Allergen Reactions  . Cymbalta [Duloxetine Hcl]     Facial edema  . Iohexol      Code: HIVES, Desc: pt states her face and throat swells when administered IV contrast - KB, Onset Date: [id number]   . Mepergan [Meperidine-Promethazine]     Cardiac arrest  . Morphine Rash  . Augmentin [Amoxicillin-Pot Clavulanate] Nausea Only  . Citalopram Other (See Comments)    Jittery/heart racing   . Hctz [Hydrochlorothiazide] Other (See Comments)    Too much urinating, felt dried out, etc--NO ALLERGY  . Lexapro [Escitalopram Oxalate] Other (See Comments)    Jittery,heart racing  . Lyrica [Pregabalin] Swelling    Face, hands, fingers, ankles  . Valsartan     REACTION: pt states INTOL to Diovan    Past Medical History, Surgical history, Social history, and Family History were reviewed and updated.  Review of Systems: All other 10 point review of systems is negative.   Physical Exam:  vitals were not taken for this visit.  Wt Readings from Last 3 Encounters:  10/26/16  188 lb 4 oz (85.4 kg)  09/16/16 186 lb 8 oz (84.6 kg)  08/02/16 183 lb 12.8 oz (83.4 kg)    Ocular: Sclerae unicteric, pupils equal, round and reactive to light Ear-nose-throat: Oropharynx clear, dentition fair Lymphatic: No cervical supraclavicular or axillary adenopathy Lungs no rales or rhonchi, good excursion bilaterally Heart regular rate and rhythm, no murmur appreciated Abd soft, nontender, positive bowel sounds, no liver or spleen tip palpated on exam, no fluid wave MSK no focal spinal tenderness, no joint edema Neuro: non-focal, well-oriented, appropriate affect Breasts: Deferred  Lab Results  Component Value Date   WBC 7.5 05/26/2016   HGB 14.1 05/26/2016   HCT 41.0  05/26/2016   MCV 87 05/26/2016   PLT 200 05/26/2016   Lab Results  Component Value Date   FERRITIN 254 05/26/2016   IRON 80 05/26/2016   TIBC 280 05/26/2016   UIBC 201 05/26/2016   IRONPCTSAT 28 05/26/2016   Lab Results  Component Value Date   RETICCTPCT 1.2 01/20/2011   RBC 4.74 05/26/2016   RETICCTABS 52.2 01/20/2011   Lab Results  Component Value Date   KPAFRELGTCHN 2.17 (H) 06/19/2015   LAMBDASER 1.94 06/19/2015   KAPLAMBRATIO 0.96 05/26/2016   Lab Results  Component Value Date   IGGSERUM 577 (L) 05/26/2016   IGA 262 06/19/2015   IGMSERUM 629 (H) 05/26/2016   Lab Results  Component Value Date   TOTALPROTELP 6.9 06/19/2015   ALBUMINELP 3.9 06/19/2015   A1GS 0.3 06/19/2015   A2GS 0.9 06/19/2015   BETS 0.4 06/19/2015   BETA2SER 0.5 06/19/2015   GAMS 0.9 06/19/2015   MSPIKE 0.4 (H) 12/17/2015   SPEI * 06/19/2015     Chemistry      Component Value Date/Time   NA 142 05/26/2016 1054   K 3.7 05/26/2016 1054   CL 105 12/30/2015 1720   CL 103 01/15/2010 0835   CO2 24 05/26/2016 1054   BUN 9.2 05/26/2016 1054   CREATININE 0.9 05/26/2016 1054      Component Value Date/Time   CALCIUM 9.4 05/26/2016 1054   ALKPHOS 122 05/26/2016 1054   AST 17 05/26/2016 1054   ALT 17 05/26/2016  1054   BILITOT 0.58 05/26/2016 1054                           Impression and Plan: Ms. Skow is 71 yo white female with iron deficiency and MGUS. So far, her protein studies have been stable and this has not been an issue for her.   She has not needed Iv iron in quite a while. Today's iron studies are pending. We will bring her back in next week for an infusion if needed.  Protein studies are pending.  She plans to follow-up with a counselor once she finds one she wants to go to.  We will plan to see her back in 6 months for repeat lab work and follow-up.  She will contact our office with any questions or concerns. We can certainly see her sooner if need be.   Eliezer Bottom, NP 3/8/201811:44 AM

## 2016-11-26 ENCOUNTER — Telehealth: Payer: Self-pay | Admitting: *Deleted

## 2016-11-26 LAB — KAPPA/LAMBDA LIGHT CHAINS
Ig Kappa Free Light Chain: 17.4 mg/L (ref 3.3–19.4)
Ig Lambda Free Light Chain: 17.9 mg/L (ref 5.7–26.3)
Kappa/Lambda FluidC Ratio: 0.97 (ref 0.26–1.65)

## 2016-11-26 NOTE — Telephone Encounter (Addendum)
Patient aware of results  ----- Message from Eliezer Bottom, NP sent at 11/26/2016  9:00 AM EST ----- Regarding: Iron  Iron studies look good. No infusion needed. Protein studies still pending.    Sarah  ----- Message ----- From: Interface, Lab In Three Zero One Sent: 11/25/2016  11:48 AM To: Eliezer Bottom, NP

## 2016-11-29 ENCOUNTER — Other Ambulatory Visit: Payer: Self-pay | Admitting: Family Medicine

## 2016-11-30 ENCOUNTER — Telehealth: Payer: Self-pay | Admitting: *Deleted

## 2016-11-30 LAB — MULTIPLE MYELOMA PANEL, SERUM
ALPHA 1: 0.2 g/dL (ref 0.0–0.4)
ALPHA2 GLOB SERPL ELPH-MCNC: 0.8 g/dL (ref 0.4–1.0)
Albumin SerPl Elph-Mcnc: 3.8 g/dL (ref 2.9–4.4)
Albumin/Glob SerPl: 1.2 (ref 0.7–1.7)
B-GLOBULIN SERPL ELPH-MCNC: 1.1 g/dL (ref 0.7–1.3)
GAMMA GLOB SERPL ELPH-MCNC: 1.1 g/dL (ref 0.4–1.8)
GLOBULIN, TOTAL: 3.3 g/dL (ref 2.2–3.9)
IGG (IMMUNOGLOBIN G), SERUM: 597 mg/dL — AB (ref 700–1600)
IgA, Qn, Serum: 258 mg/dL (ref 87–352)
IgM, Qn, Serum: 629 mg/dL — ABNORMAL HIGH (ref 26–217)
M PROTEIN SERPL ELPH-MCNC: 0.4 g/dL — AB
Total Protein: 7.1 g/dL (ref 6.0–8.5)

## 2016-11-30 NOTE — Telephone Encounter (Addendum)
Generic message left on voice mail  ----- Message from Eliezer Bottom, NP sent at 11/30/2016  1:13 PM EDT ----- Regarding: MGUS Protein studies are stable. Thank you!  Sarah  ----- Message ----- From: Interface, Lab In Three Zero One Sent: 11/25/2016  11:48 AM To: Eliezer Bottom, NP

## 2016-12-07 ENCOUNTER — Institutional Professional Consult (permissible substitution): Payer: BLUE CROSS/BLUE SHIELD | Admitting: Pulmonary Disease

## 2016-12-13 DIAGNOSIS — M62830 Muscle spasm of back: Secondary | ICD-10-CM | POA: Diagnosis not present

## 2016-12-13 DIAGNOSIS — M961 Postlaminectomy syndrome, not elsewhere classified: Secondary | ICD-10-CM | POA: Diagnosis not present

## 2016-12-13 DIAGNOSIS — K5900 Constipation, unspecified: Secondary | ICD-10-CM | POA: Diagnosis not present

## 2016-12-13 DIAGNOSIS — G894 Chronic pain syndrome: Secondary | ICD-10-CM | POA: Diagnosis not present

## 2016-12-14 ENCOUNTER — Telehealth: Payer: Self-pay | Admitting: Family Medicine

## 2016-12-14 NOTE — Telephone Encounter (Signed)
Please advise. Thanks.  

## 2016-12-14 NOTE — Telephone Encounter (Signed)
Patient called asking for clarification on why insurance would not cover her and her husbands CPE appointment that was changed to office visit. Just wanted a call back and clarification.

## 2016-12-20 NOTE — Telephone Encounter (Signed)
Pt was called.

## 2016-12-20 NOTE — Telephone Encounter (Signed)
Patient called to inquire about her insurance and the coverage. Please call patient to advise as soon as possible. Patient stated that she would like a call back today if possible.

## 2016-12-20 NOTE — Telephone Encounter (Signed)
Please advise. Thanks.  

## 2016-12-21 ENCOUNTER — Other Ambulatory Visit: Payer: Self-pay | Admitting: Family Medicine

## 2016-12-21 NOTE — Telephone Encounter (Signed)
[employer].  RF request for meloxicam LOV: 09/10/15 Next ov: 01/19/17 Last written: 09/02/14 #180 w/ 1RF  Please advise. Thanks.

## 2016-12-28 DIAGNOSIS — R1013 Epigastric pain: Secondary | ICD-10-CM | POA: Diagnosis not present

## 2016-12-29 ENCOUNTER — Ambulatory Visit (INDEPENDENT_AMBULATORY_CARE_PROVIDER_SITE_OTHER): Payer: Medicare Other | Admitting: Family Medicine

## 2016-12-29 ENCOUNTER — Emergency Department (HOSPITAL_BASED_OUTPATIENT_CLINIC_OR_DEPARTMENT_OTHER)
Admission: EM | Admit: 2016-12-29 | Discharge: 2016-12-30 | Disposition: A | Discharge status: Home / Self Care | Payer: Medicare Other | Attending: Emergency Medicine | Admitting: Emergency Medicine

## 2016-12-29 ENCOUNTER — Encounter (HOSPITAL_BASED_OUTPATIENT_CLINIC_OR_DEPARTMENT_OTHER): Payer: Self-pay | Admitting: *Deleted

## 2016-12-29 ENCOUNTER — Encounter: Payer: Self-pay | Admitting: Family Medicine

## 2016-12-29 ENCOUNTER — Other Ambulatory Visit: Payer: Self-pay

## 2016-12-29 ENCOUNTER — Emergency Department (HOSPITAL_BASED_OUTPATIENT_CLINIC_OR_DEPARTMENT_OTHER): Payer: Medicare Other

## 2016-12-29 ENCOUNTER — Ambulatory Visit (HOSPITAL_BASED_OUTPATIENT_CLINIC_OR_DEPARTMENT_OTHER)
Admission: RE | Admit: 2016-12-29 | Discharge: 2016-12-29 | Disposition: A | Discharge status: Home / Self Care | Payer: Medicare Other | Source: Ambulatory Visit | Attending: Family Medicine | Admitting: Family Medicine

## 2016-12-29 VITALS — BP 127/67 | HR 77 | Temp 98.4°F | Resp 16 | Ht 66.0 in | Wt 182.5 lb

## 2016-12-29 DIAGNOSIS — R1013 Epigastric pain: Secondary | ICD-10-CM

## 2016-12-29 DIAGNOSIS — K2900 Acute gastritis without bleeding: Secondary | ICD-10-CM | POA: Insufficient documentation

## 2016-12-29 DIAGNOSIS — R1011 Right upper quadrant pain: Secondary | ICD-10-CM | POA: Diagnosis not present

## 2016-12-29 DIAGNOSIS — R52 Pain, unspecified: Secondary | ICD-10-CM

## 2016-12-29 DIAGNOSIS — E039 Hypothyroidism, unspecified: Secondary | ICD-10-CM | POA: Insufficient documentation

## 2016-12-29 DIAGNOSIS — R141 Gas pain: Secondary | ICD-10-CM

## 2016-12-29 DIAGNOSIS — N182 Chronic kidney disease, stage 2 (mild): Secondary | ICD-10-CM | POA: Insufficient documentation

## 2016-12-29 DIAGNOSIS — Z79899 Other long term (current) drug therapy: Secondary | ICD-10-CM | POA: Insufficient documentation

## 2016-12-29 DIAGNOSIS — I129 Hypertensive chronic kidney disease with stage 1 through stage 4 chronic kidney disease, or unspecified chronic kidney disease: Secondary | ICD-10-CM | POA: Diagnosis not present

## 2016-12-29 DIAGNOSIS — I700 Atherosclerosis of aorta: Secondary | ICD-10-CM | POA: Insufficient documentation

## 2016-12-29 DIAGNOSIS — R109 Unspecified abdominal pain: Secondary | ICD-10-CM | POA: Diagnosis not present

## 2016-12-29 DIAGNOSIS — Z0389 Encounter for observation for other suspected diseases and conditions ruled out: Secondary | ICD-10-CM | POA: Diagnosis not present

## 2016-12-29 LAB — CBC WITH DIFFERENTIAL/PLATELET
BASOS PCT: 0.3 % (ref 0.0–3.0)
BASOS PCT: 0 %
Basophils Absolute: 0 10*3/uL (ref 0.0–0.1)
Basophils Absolute: 0 10*3/uL (ref 0.0–0.1)
EOS ABS: 0 10*3/uL (ref 0.0–0.7)
EOS PCT: 0 %
Eosinophils Absolute: 0.1 10*3/uL (ref 0.0–0.7)
Eosinophils Relative: 0.7 % (ref 0.0–5.0)
HCT: 39.8 % (ref 36.0–46.0)
HEMATOCRIT: 41.1 % (ref 36.0–46.0)
HEMOGLOBIN: 13.8 g/dL (ref 12.0–15.0)
Hemoglobin: 12.9 g/dL (ref 12.0–15.0)
LYMPHS PCT: 13.7 % (ref 12.0–46.0)
Lymphocytes Relative: 14 %
Lymphs Abs: 1.4 10*3/uL (ref 0.7–4.0)
Lymphs Abs: 1.4 10*3/uL (ref 0.7–4.0)
MCH: 29.2 pg (ref 26.0–34.0)
MCHC: 33.5 g/dL (ref 30.0–36.0)
MCHC: 32.4 g/dL (ref 30.0–36.0)
MCV: 88.2 fl (ref 78.0–100.0)
MCV: 90 fL (ref 78.0–100.0)
MONOS PCT: 5 %
Monocytes Absolute: 0.5 10*3/uL (ref 0.1–1.0)
Monocytes Absolute: 0.6 10*3/uL (ref 0.1–1.0)
Monocytes Relative: 5.2 % (ref 3.0–12.0)
Neutro Abs: 8.4 10*3/uL — ABNORMAL HIGH (ref 1.7–7.7)
Neutro Abs: 8.5 10*3/uL — ABNORMAL HIGH (ref 1.4–7.7)
Neutrophils Relative %: 80.1 % — ABNORMAL HIGH (ref 43.0–77.0)
Neutrophils Relative %: 81 %
PLATELETS: 163 10*3/uL (ref 150–400)
Platelets: 191 10*3/uL (ref 150.0–400.0)
RBC: 4.42 MIL/uL (ref 3.87–5.11)
RBC: 4.66 Mil/uL (ref 3.87–5.11)
RDW: 13.1 % (ref 11.5–15.5)
RDW: 12.4 % (ref 11.5–15.5)
WBC: 10.3 10*3/uL (ref 4.0–10.5)
WBC: 10.6 10*3/uL — AB (ref 4.0–10.5)

## 2016-12-29 LAB — BASIC METABOLIC PANEL
Anion gap: 5 (ref 5–15)
BUN: 11 mg/dL (ref 6–20)
CO2: 29 mmol/L (ref 22–32)
CREATININE: 1.06 mg/dL — AB (ref 0.44–1.00)
Calcium: 8.8 mg/dL — ABNORMAL LOW (ref 8.9–10.3)
Chloride: 103 mmol/L (ref 101–111)
GFR calc Af Amer: >60 mL/min (ref >60)
GFR, EST NON AFRICAN AMERICAN: 52 mL/min — AB (ref >60)
GLUCOSE: 123 mg/dL — AB (ref 65–99)
Potassium: 3.9 mmol/L (ref 3.5–5.1)
Sodium: 137 mmol/L (ref 135–145)

## 2016-12-29 LAB — COMPREHENSIVE METABOLIC PANEL
ALBUMIN: 4.2 g/dL (ref 3.5–5.2)
ALK PHOS: 98 U/L (ref 39–117)
ALT: 16 U/L (ref 0–35)
AST: 12 U/L (ref 0–37)
BUN: 13 mg/dL (ref 6–23)
CALCIUM: 9.2 mg/dL (ref 8.4–10.5)
CHLORIDE: 103 meq/L (ref 96–112)
CO2: 29 mEq/L (ref 19–32)
CREATININE: 0.9 mg/dL (ref 0.40–1.20)
GFR: 65.78 mL/min (ref >60.00)
Glucose, Bld: 114 mg/dL — ABNORMAL HIGH (ref 70–99)
POTASSIUM: 4.8 meq/L (ref 3.5–5.1)
Sodium: 139 mEq/L (ref 135–145)
Total Bilirubin: 0.9 mg/dL (ref 0.2–1.2)
Total Protein: 7 g/dL (ref 6.0–8.3)

## 2016-12-29 LAB — H. PYLORI ANTIBODY, IGG: H PYLORI IGG: NEGATIVE

## 2016-12-29 MED ORDER — SUCRALFATE 1 G PO TABS: 1.0000 g | ORAL_TABLET | Freq: Three times a day (TID) | ORAL | 0 refills | Status: DC

## 2016-12-29 MED ORDER — OMEPRAZOLE 40 MG PO CPDR: 40.0000 mg | DELAYED_RELEASE_CAPSULE | Freq: Every day | ORAL | 3 refills | Status: DC

## 2016-12-29 MED ORDER — GI COCKTAIL ~~LOC~~
30.0000 mL | Freq: Once | ORAL | Status: AC
  Administered 2016-12-29: 30 mL via ORAL
  Filled 2016-12-29: qty 30

## 2016-12-29 NOTE — Progress Notes (Signed)
Pre visit review using our clinic review tool, if applicable. No additional management support is needed unless otherwise documented below in the visit note. 

## 2016-12-29 NOTE — ED Triage Notes (Signed)
Pt c/o epigastic/chest pain x 2 days, seen at PMD office today for same DX gastritis

## 2016-12-29 NOTE — Patient Instructions (Signed)
Stop mobic.  OK to take oxycodone.

## 2016-12-29 NOTE — Progress Notes (Signed)
OFFICE VISIT  12/29/2016  CC:  Chief Complaint  Patient presents with  . Abdominal Pain    RUQ x 1 day   HPI:    Patient is a 71 y.o. Caucasian female with a history of IBS and diverticulitis who presents accompanied by her husband for evaluation of recent abdominal pain for which she was seen at an urgent care in [place], [place] on 12/28/16. Onset of pain 2 nights ago in mid upper abd and RUQ.  She had eaten chinese for lunch but no supper that night. She woke up from sleep with the pain.  No nausea at that time.  The pain is constant, no matter what position.  Eating dry toast didn't change anything--not worse or improved.  No fever.  Nausea has briefly come and gone in the last couple days, but no vomiting.  Pain is worse when taking deep breath. No diarrhea, no BRB in stool.  Having a BM daily, no melena.  She had just restarted taking mobic qd about 3 days prior to the onset of pain.  No other NSAIDs.  No ASA.  Not taking oxycodone that she usually takes for chonic musculoskeletal/joint pain. She went to an UC in [place], [place] the morning after the sx's started, says no testing was done.  Says they had trouble getting their EKG machine to work and they eventually just told her to go to the ER.  She and her husband just got in their car and came back to [facility] yesterday evening.  ROS: upper back/shoulders myalgias c/w past fibromyalgia pain.   No chest pain, no SOB, no pain in mid or upper back.  No LUQ pain.  No lower abd pain.  No abd distention noted. No urinary complaints.    Past Medical History:  Diagnosis Date  . Allergic rhinitis   . Anxiety   . Chest pain    cr  . Chronic pain syndrome    right knee osteo, hx of TKA in R knee.  Pt was told by [name] that no further surgical options are available for her--[name]  . Chronic renal insufficiency, stage II (mild) 2015   CrCl in th 60s.  . Diverticulosis of colon    with hx of 'itis  . DJD  (degenerative joint disease)    L spine and knees  . FATIGUE / MALAISE 07/01/2009   Chronic  . Fibromyalgia   . GERD (gastroesophageal reflux disease)   . History of colonic polyps   . HTN (hypertension)   . Hypercholesterolemia   . Hypothyroid 07/05/2012  . IBS (irritable bowel syndrome)   . IC (interstitial cystitis)    [name] at [facility]  . Iron deficiency anemia, unspecified 06/27/2013   Has required IV iron on multiple occasions  . Lumbar back pain   . Major depression, recurrent ([place])    vs dysthymia,chronic  . Memory impairment 10/03/2012   Memory impairment 10/03/2012 >referral to neuro    . Monoclonal gammopathy of undetermined significance    IgM kappa MGUS ([name]) routine f/u has shown stability of this problem.  . OSA (obstructive sleep apnea)    does not have CPAP; Dr. Halford Chessman  . Osteopenia 04/2014   T score -2.0 FRAX 11%/1.8%  . Otitis externa, candida 41/9379   [name] tx'd her with clotrimazole 1% external solution x 1 mo  . Palpitations   . Psoriasis   . Vitamin D deficiency   . Xerostomia    and  xerophthalmia--per [name] (he suggests SSA, SSB, ESR, rh factor, ANA, and antimicrosmal and antithyroglobulin ab's as of 02/17/16)    Past Surgical History:  Procedure Laterality Date  . ABDOMINAL HYSTERECTOMY     for prolapse with abdominal incision  . BACK SURGERY  [id number]   L2-3 fusion ([name])  . CARDIAC CATHETERIZATION     no PCI  . CHOLECYSTECTOMY    . COLONOSCOPY  04/2006   Diverticulosis, o/w normal.  . ESOPHAGOGASTRODUODENOSCOPY  05/29/2009   Esoph normal.  Gastritis (H pylori NEG) + gastric polyps (benign).  Duodenal biopsy NORMAL.  [name] EYE SURGERY Left    "lazy eye"  . HAND SURGERY Right    TRIGGER FINGER; index finger  . HERNIA REPAIR     umbilical  . KNEE ARTHROSCOPY     RIGHT KNEE X 5  . LAMINOTOMY  2010   L-4, L-5 FUSION  . NASAL SINUS SURGERY     polypectomy (remote past)  . OOPHORECTOMY  2010   Laparoscopic BSO  (path benign)  . PATELLA FRACTURE SURGERY  10-08   [name]  . POSTERIOR LAMINECTOMY / DECOMPRESSION LUMBAR SPINE  9-09   [name]  . TOTAL KNEE ARTHROPLASTY  12-07 and 4-07   [name], revision 12-09 [name] at [facility]    Outpatient Medications Prior to Visit  Medication Sig Dispense Refill  . ALPRAZolam (XANAX) 0.5 MG tablet TAKE ONE-HALF TO ONE TABLET BY MOUTH THREE TIMES DAILY 90 tablet 5  . Azelastine HCl (ASTEPRO) 0.15 % SOLN 2 sprays each nostril every 12 hours (Patient taking differently: Place 2 sprays into the nose 2 (two) times daily as needed (Allergies). 2 sprays each nostril every 12 hours) 30 mL 11  . calcium carbonate (OSCAL) 1500 (600 Ca) MG TABS tablet Take by mouth daily.    . Cholecalciferol (VITAMIN D) 2000 UNITS CAPS Take 5 capsules by mouth daily.     . cyclobenzaprine (FLEXERIL) 5 MG tablet     . diphenhydrAMINE (BENADRYL) 25 MG tablet Take 25 mg by mouth at bedtime as needed.    . fish oil-omega-3 fatty acids 1000 MG capsule Take 2 g by mouth daily.     . hydrocortisone 2.5 % cream Apply 1 application topically as needed. RECTAL ITCHING    . Inositol Niacinate 500 MG CAPS Take 1 capsule by mouth at bedtime.    [name] levothyroxine (SYNTHROID, LEVOTHROID) 75 MCG tablet TAKE ONE-HALF TABLET BY MOUTH ONCE DAILY 45 tablet 1  . meclizine (ANTIVERT) 25 MG tablet Take 1 tablet (25 mg total) by mouth every 6 (six) hours as needed for dizziness. 360 tablet 3  . meloxicam (MOBIC) 7.5 MG tablet TAKE TWO TABLETS BY MOUTH ONCE DAILY 180 tablet 1  . metoprolol tartrate (LOPRESSOR) 25 MG tablet TAKE ONE TABLET BY MOUTH ONCE DAILY 90 tablet 1  . Multiple Vitamins-Iron (MULTIVITAMIN/IRON PO) Take 1 tablet by mouth daily.     . Oxycodone HCl 10 MG TABS 10 mg every 4 (four) hours as needed. 1 every 6 hours for pain    . Probiotic Product (ALIGN) 4 MG CAPS Take 1 capsule (4 mg total) by mouth daily. 60 capsule 0  . promethazine (PHENERGAN) 25 MG tablet TAKE ONE TABLET BY MOUTH EVERY 6 HOURS  AS NEEDED FOR NAUSEA AND VOMITING 90 tablet 3  . simvastatin (ZOCOR) 40 MG tablet Take 1 tablet (40 mg total) by mouth at bedtime. 90 tablet 3  . Specialty Vitamins Products (MAGNESIUM, AMINO ACID CHELATE,) 133  MG tablet Take 1 tablet by mouth daily.    . ondansetron (ZOFRAN) 4 MG tablet Take 1 tablet (4 mg total) by mouth every 8 (eight) hours as needed for nausea or vomiting. (Patient not taking: Reported on 12/29/2016) 20 tablet 0   No facility-administered medications prior to visit.     Allergies  Allergen Reactions  . Cymbalta [Duloxetine Hcl]     Facial edema  . Iohexol      Code: HIVES, Desc: pt states her face and throat swells when administered IV contrast - KB, Onset Date: [id number]   . Mepergan [Meperidine-Promethazine]     Cardiac arrest  . Morphine Rash  . Augmentin [Amoxicillin-Pot Clavulanate] Nausea Only  . Citalopram Other (See Comments)    Jittery/heart racing   . Hctz [Hydrochlorothiazide] Other (See Comments)    Too much urinating, felt dried out, etc--NO ALLERGY  . Lexapro [Escitalopram Oxalate] Other (See Comments)    Jittery,heart racing  . Lyrica [Pregabalin] Swelling    Face, hands, fingers, ankles  . Valsartan     REACTION: pt states INTOL to Diovan    ROS As per HPI  PE: Blood pressure 127/67, pulse 77, temperature 98.4 F (36.9 C), temperature source Oral, resp. rate 16, height _0  (1.676 m), weight 182 lb 8 oz (82.8 kg), SpO2 94 %. Gen: Alert, well appearing.  Patient is oriented to person, place, time, and situation. QJJ:HERD: no injection, icteris, swelling, or exudate.  EOMI, PERRLA. Mouth: lips without lesion/swelling.  Oral mucosa pink and moist. Oropharynx without erythema, exudate, or swelling.  Neck: supple/nontender.  No LAD, mass, or TM.  Carotid pulses 2+ bilaterally, without bruits. CV: RRR, no m/r/g.   LUNGS: CTA bilat, nonlabored resps, good aeration in all lung fields. Chest wall: some TTP just over R upper breast region. ABD:  soft, nondistended, BS normal, no HSM or mass or bruit.  She has significant mid epigastric/subxyphoid TTP as well as signif RUQ TTP.  Voluntary guarding but no rebound tenderness.  LABS:  Lab Results  Component Value Date   TSH 0.97 02/24/2016   Lab Results  Component Value Date   WBC 9.8 11/25/2016   HGB 15.2 11/25/2016   HCT 44.1 11/25/2016   MCV 87 11/25/2016   PLT 210 11/25/2016   Lab Results  Component Value Date   CREATININE 0.8 11/25/2016   BUN 15.3 11/25/2016   NA 138 11/25/2016   K 3.8 11/25/2016   CL 105 12/30/2015   CO2 25 11/25/2016   Lab Results  Component Value Date   ALT 30 11/25/2016   AST 24 11/25/2016   ALKPHOS 99 11/25/2016   BILITOT 0.61 11/25/2016   Lab Results  Component Value Date   CHOL 209 (H) 09/23/2014   Lab Results  Component Value Date   HDL 36.60 (L) 09/23/2014   Lab Results  Component Value Date   LDLCALC 138 (H) 09/23/2014   Lab Results  Component Value Date   TRIG 172.0 (H) 09/23/2014   Lab Results  Component Value Date   CHOLHDL 6 09/23/2014   IMPRESSION AND PLAN:  Acute epigastric and RUQ pain. Gastritis suspected.  Other ddx are choledocholithiasis, PUD, SBO.  Less likely dissecting aortic aneurism.  Low suspicion of anything cardiopulmonary. Check CBC, CMET, H pylori ab, hemoccults x 3. Also check acute abdominal x-ray series. Start carafate 1g qid prn, start omeprazole 81m qd. Stop mobic.  An After Visit Summary was printed and given to the patient.  Spent 30 min with  pt today, with >50% of this time spent in counseling and care coordination regarding the above problems.  FOLLOW UP: Return for to be determined based on results of work-up.  Signed:  Crissie Sickles, MD           12/29/2016

## 2016-12-30 ENCOUNTER — Telehealth: Payer: Self-pay | Admitting: *Deleted

## 2016-12-30 ENCOUNTER — Encounter (HOSPITAL_BASED_OUTPATIENT_CLINIC_OR_DEPARTMENT_OTHER): Payer: Self-pay | Admitting: Emergency Medicine

## 2016-12-30 DIAGNOSIS — K2900 Acute gastritis without bleeding: Secondary | ICD-10-CM | POA: Diagnosis not present

## 2016-12-30 LAB — TROPONIN I: Troponin I: <0.03 ng/mL (ref <0.03)

## 2016-12-30 MED ORDER — DICYCLOMINE HCL 20 MG PO TABS: 20.0000 mg | ORAL_TABLET | Freq: Two times a day (BID) | ORAL | 0 refills | Status: DC

## 2016-12-30 MED ORDER — SUCRALFATE 1 GM/10ML PO SUSP: 1.0000 g | Freq: Three times a day (TID) | ORAL | 0 refills | Status: DC

## 2016-12-30 MED ORDER — DICYCLOMINE HCL 10 MG PO CAPS
10.0000 mg | ORAL_CAPSULE | Freq: Once | ORAL | Status: AC
  Administered 2016-12-30: 10 mg via ORAL
  Filled 2016-12-30: qty 1

## 2016-12-30 NOTE — Telephone Encounter (Signed)
PLEASE NOTE: All timestamps contained within this report are represented as [place] Standard Time. CONFIDENTIALTY NOTICE: This fax transmission is intended only for the addressee. It contains information that is legally privileged, confidential or otherwise protected from use or disclosure. If you are not the intended recipient, you are strictly prohibited from reviewing, disclosing, copying using or disseminating any of this information or taking any action in reliance on or regarding this information. If you have received this fax in error, please notify us immediately by telephone so that we can arrange for its return to [place]. Phone: [phone], Toll-Free: [phone], Fax: [phone] Page: 1 of 1 Call Id: [id number] [place] Patient Name: [name] Gender: Female DOB: Sep 11, 1946 Age: 51 Y 28 D Return Phone Number: [phone] (Primary) City/State/Zip: [place] [address] Client [facility] [facility] - Client Client Site [facility] Physician Crissie Sickles - MD Who Is Calling Patient / Member / Family / Caregiver Call Type Triage / Clinical Relationship To Patient Self Return Phone Number [phone] (Primary) Chief Complaint ABDOMINAL PAIN - Severe and only in abdomen Reason for Call Symptomatic / Request for [place] saw dr today was sent for X rays, temp of 101, pain in abdomen severe, has not gotten worse. Nurse Assessment Nurse: Venetia Maxon, RN, Manuela Schwartz Date/Time (Eastern Time): 12/29/2016 8:39:35 PM Confirm and document reason for call. If symptomatic, describe symptoms. ---Caller saw dr today was sent for X rays of chest and abdomen , temp of 101 orally, pain in abdomen severe, has not gotten worse. she did not have a fever . hx of diverticulitis . Last urine was a few min ago. not burning. Does the PT have  any chronic conditions? (i.e. diabetes, asthma, etc.) ---Yes List chronic conditions. ---HTN, knee RT replacement 5 surgeries cholesterol Guidelines Guideline Title Affirmed Question Abdominal Pain - Upper [1] SEVERE pain (e.g., excruciating) AND [2] present > 1 hour Disp. Time Eilene Ghazi Time) Disposition Final User 12/29/2016 8:43:34 PM Go to ED Now Yes Venetia Maxon, RN, Manuela Schwartz Referrals [facility] - ED Care Advice Given Per Guideline GO TO ED NOW: You need to be seen in the Emergency Department. Go to the ER at ___________ [place] now. Drive carefully. NOTHING BY MOUTH: Do not eat or drink anything for now. (Reason: condition may need surgery and general anesthesia.) CARE ADVICE given per Abdominal Pain, Upper (Adult) guideline. * Please bring a list of your current medicines when you go to the Emergency Department (ER).

## 2016-12-30 NOTE — Telephone Encounter (Signed)
Pt went to ER as advised.

## 2016-12-30 NOTE — ED Provider Notes (Signed)
[facility] Provider Note   CSN: [id number] Arrival date & time: 12/29/16  2115     History   Chief Complaint Chief Complaint  Patient presents with  . Chest Pain    HPI [name] is a 71 y.o. female.  The history is provided by the patient.  Chest Pain   This is a new problem. The current episode started more than 2 days ago. The problem occurs constantly. The problem has not changed since onset.The pain is associated with rest. The pain is present in the epigastric region. The pain is moderate. Quality: cramping. The pain does not radiate. Exacerbated by: none. Associated symptoms include abdominal pain. Pertinent negatives include no back pain, no claudication, no cough, no diaphoresis, no dizziness, no exertional chest pressure, no fever, no headaches, no hemoptysis, no irregular heartbeat, no leg pain, no lower extremity edema, no malaise/fatigue, no nausea, no near-syncope, no numbness, no orthopnea, no palpitations, no PND, no shortness of breath, no sputum production, no syncope, no vomiting and no weakness. She has tried nothing for the symptoms. The treatment provided no relief. Risk factors include obesity.  Pertinent negatives for past medical history include no aneurysm.  Pertinent negatives for family medical history include: no sickle cell disease.  Procedure history is negative for EPS study.    Past Medical History:  Diagnosis Date  . Allergic rhinitis   . Anxiety   . Chest pain    cr  . Chronic pain syndrome    right knee osteo, hx of TKA in R knee.  Pt was told by [name] that no further surgical options are available for her--[name]  . Chronic renal insufficiency, stage II (mild) 2015   [facility] in th 60s.  . Diverticulosis of colon    with hx of 'itis  . DJD (degenerative joint disease)    L spine and knees  . FATIGUE / MALAISE 07/01/2009   Chronic  . Fibromyalgia   . GERD (gastroesophageal reflux disease)   .  History of colonic polyps   . HTN (hypertension)   . Hypercholesterolemia   . Hypothyroid 07/05/2012  . IBS (irritable bowel syndrome)   . IC (interstitial cystitis)    [name] at [facility]  . Iron deficiency anemia, unspecified 06/27/2013   Has required IV iron on multiple occasions  . Lumbar back pain   . Major depression, recurrent (HCC)    vs dysthymia,chronic  . Memory impairment 10/03/2012   Memory impairment 10/03/2012 >referral to neuro    . Monoclonal gammopathy of undetermined significance    IgM kappa MGUS (Dr. Myna Hidalgo) routine f/u has shown stability of this problem.  . OSA (obstructive sleep apnea)    does not have CPAP; Dr. Craige Cotta  . Osteopenia 04/2014   T score -2.0 FRAX 11%/1.8%  . Otitis externa, candida 07/2015   Dr. Lazarus Salines tx'd her with clotrimazole 1% external solution x 1 mo  . Palpitations   . Psoriasis   . Vitamin D deficiency   . Xerostomia    and xerophthalmia--per Dr. Lazarus Salines (he suggests SSA, SSB, ESR, rh factor, ANA, and antimicrosmal and antithyroglobulin ab's as of 02/17/16)    Patient Active Problem List   Diagnosis Date Noted  . Acute pharyngitis 03/01/2016  . Myalgia and myositis 02/24/2016  . Arthralgia 02/24/2016  . Dry mouth 02/24/2016  . MGUS (monoclonal gammopathy of unknown significance) 12/17/2015  . DDD (degenerative disc disease), lumbar 01/20/2015  . Spinal stenosis, lumbar region, with  neurogenic claudication 01/20/2015  . Degenerative disc disease, lumbar 01/20/2015  . Uncontrolled hypertension 06/01/2014  . Depression 06/01/2014  . Chronic fatigue fibromyalgia syndrome 02/13/2014  . Left-sided chest wall pain 02/13/2014  . Insomnia 02/13/2014  . Iron deficiency anemia 06/27/2013  . Hypothyroid 07/05/2012  . Involuntary muscle contractions 07/04/2012  . Chronic pain syndrome 01/27/2011  . OBSTRUCTIVE SLEEP APNEA 08/28/2009  . CENTRAL SLEEP APNEA CONDS CLASSIFIED ELSEWHERE 08/28/2009  . GASTRITIS 08/21/2008  . PERSONAL HX  COLONIC POLYPS 08/05/2008  . Vitamin D deficiency 01/13/2008  . PSORIASIS 01/13/2008  . Disorder of plasma protein metabolism 01/12/2008  . DEGENERATIVE JOINT DISEASE 01/12/2008  . HYPERCHOLESTEROLEMIA 08/01/2007  . Essential hypertension 08/01/2007  . GERD 08/01/2007  . Fibromyalgia syndrome 08/01/2007  . PALPITATIONS, HX OF 08/01/2007    Past Surgical History:  Procedure Laterality Date  . ABDOMINAL HYSTERECTOMY     for prolapse with abdominal incision  . BACK SURGERY  [id number]   L2-3 fusion ([name])  . CARDIAC CATHETERIZATION     no PCI  . CHOLECYSTECTOMY    . COLONOSCOPY  04/2006   Diverticulosis, o/w normal.  . ESOPHAGOGASTRODUODENOSCOPY  05/29/2009   Esoph normal.  Gastritis (H pylori NEG) + gastric polyps (benign).  Duodenal biopsy NORMAL.  [name] EYE SURGERY Left    "lazy eye"  . HAND SURGERY Right    TRIGGER FINGER; index finger  . HERNIA REPAIR     umbilical  . KNEE ARTHROSCOPY     RIGHT KNEE X 5  . LAMINOTOMY  2010   L-4, L-5 FUSION  . NASAL SINUS SURGERY     polypectomy (remote past)  . OOPHORECTOMY  2010   Laparoscopic BSO (path benign)  . PATELLA FRACTURE SURGERY  10-08   [name]  . POSTERIOR LAMINECTOMY / DECOMPRESSION LUMBAR SPINE  9-09   [name]  . TOTAL KNEE ARTHROPLASTY  12-07 and 4-07   [name], revision 12-09 [name] at [facility] History    Gravida Para Term Preterm AB Living   0             SAB TAB Ectopic Multiple Live Births                   Home Medications    Prior to Admission medications   Medication Sig Start Date End Date Taking? Authorizing Provider  acetaminophen (TYLENOL) 325 MG tablet Take 650 mg by mouth every 6 (six) hours as needed.   Yes Historical Provider, MD  ALPRAZolam (XANAX) 0.5 MG tablet TAKE ONE-HALF TO ONE TABLET BY MOUTH THREE TIMES DAILY 05/18/16   [name], MD  Azelastine HCl (ASTEPRO) 0.15 % SOLN 2 sprays each nostril every 12 hours Patient taking differently: Place 2 sprays into the nose 2  (two) times daily as needed (Allergies). 2 sprays each nostril every 12 hours 07/19/14   [name], MD  calcium carbonate (OSCAL) 1500 (600 Ca) MG TABS tablet Take by mouth daily.    Historical Provider, MD  Cholecalciferol (VITAMIN D) 2000 UNITS CAPS Take 5 capsules by mouth daily.     Historical Provider, MD  cyclobenzaprine (FLEXERIL) 5 MG tablet  05/09/15   Historical Provider, MD  diphenhydrAMINE (BENADRYL) 25 MG tablet Take 25 mg by mouth at bedtime as needed.    Historical Provider, MD  fish oil-omega-3 fatty acids 1000 MG capsule Take 2 g by mouth daily.     Historical Provider, MD  hydrocortisone 2.5 % cream  Apply 1 application topically as needed. RECTAL ITCHING 09/08/12   Historical Provider, MD  Inositol Niacinate 500 MG CAPS Take 1 capsule by mouth at bedtime.    Historical Provider, MD  levothyroxine (SYNTHROID, LEVOTHROID) 75 MCG tablet TAKE ONE-HALF TABLET BY MOUTH ONCE DAILY 06/16/16   [name], MD  meclizine (ANTIVERT) 25 MG tablet Take 1 tablet (25 mg total) by mouth every 6 (six) hours as needed for dizziness. 11/17/15   [name], MD  meloxicam (MOBIC) 7.5 MG tablet TAKE TWO TABLETS BY MOUTH ONCE DAILY 12/21/16   [name], MD  metoprolol tartrate (LOPRESSOR) 25 MG tablet TAKE ONE TABLET BY MOUTH ONCE DAILY 06/16/16   [name], MD  Multiple Vitamins-Iron (MULTIVITAMIN/IRON PO) Take 1 tablet by mouth daily.     Historical Provider, MD  omeprazole (PRILOSEC) 40 MG capsule Take 1 capsule (40 mg total) by mouth daily. 12/29/16   [name], MD  Oxycodone HCl 10 MG TABS 10 mg every 4 (four) hours as needed. 1 every 6 hours for pain 12/01/15   Historical Provider, MD  Probiotic Product (ALIGN) 4 MG CAPS Take 1 capsule (4 mg total) by mouth daily. 10/26/16   Renee A Kuneff, DO  promethazine (PHENERGAN) 25 MG tablet TAKE ONE TABLET BY MOUTH EVERY 6 HOURS AS NEEDED FOR NAUSEA AND VOMITING 11/29/16   [name], MD  simvastatin (ZOCOR) 40 MG tablet  Take 1 tablet (40 mg total) by mouth at bedtime. 09/10/15   [name], MD  Specialty Vitamins Products (MAGNESIUM, AMINO ACID CHELATE,) 133 MG tablet Take 1 tablet by mouth daily.    Historical Provider, MD  sucralfate (CARAFATE) 1 g tablet Take 1 tablet (1 g total) by mouth 4 (four) times daily -  with meals and at bedtime. 12/29/16   [name], MD    Family History Family History  Problem Relation Age of Onset  . Leukemia Brother   . Lung disease Brother   . Melanoma Brother   . Hypertension Mother   . Thyroid disease Mother   . Hypertension Father   . Breast cancer Sister     Age 88's  . Hypertension Sister   . Coronary artery disease Sister   . Lupus Sister   . Thyroid disease Sister   . Thyroid disease Brother   . Cancer Other     Social History Social History  Substance Use Topics  . Smoking status: Never Smoker  . Smokeless tobacco: Never Used     Comment: NEVER USED TOBACCO  . Alcohol use No     Allergies   Cymbalta [duloxetine hcl]; Iohexol; Mepergan [meperidine-promethazine]; Morphine; Augmentin [amoxicillin-pot clavulanate]; Citalopram; Hctz [hydrochlorothiazide]; Lexapro [escitalopram oxalate]; Lyrica [pregabalin]; and Valsartan   Review of Systems Review of Systems  Constitutional: Negative for diaphoresis, fever and malaise/fatigue.  Respiratory: Negative for cough, hemoptysis, sputum production, chest tightness and shortness of breath.   Cardiovascular: Positive for chest pain. Negative for palpitations, orthopnea, claudication, leg swelling, syncope, PND and near-syncope.  Gastrointestinal: Positive for abdominal pain. Negative for nausea and vomiting.  Musculoskeletal: Negative for back pain.  Neurological: Negative for dizziness, weakness, numbness and headaches.  All other systems reviewed and are negative.    Physical Exam Updated Vital Signs BP (!) 165/70   Pulse 80   Temp 99.4 F (37.4 C) (Oral)   Resp 17   SpO2 94%    Physical Exam  Constitutional: She is oriented to person, place, and time.  She appears well-developed and well-nourished. No distress.  HENT:  Head: Normocephalic and atraumatic.  Mouth/Throat: No oropharyngeal exudate.  Eyes: Conjunctivae and EOM are normal. Pupils are equal, round, and reactive to light.  Neck: Normal range of motion. Neck supple. No JVD present.  Cardiovascular: Normal rate, regular rhythm, normal heart sounds and intact distal pulses.   Pulmonary/Chest: Effort normal and breath sounds normal. No stridor. She has no wheezes. She has no rales.  Abdominal: Soft. She exhibits no mass. There is no hepatosplenomegaly. There is no rigidity, no rebound, no guarding, no tenderness at McBurney's point and negative [name]'s sign.  Gassy throughout  Musculoskeletal: Normal range of motion. She exhibits no tenderness.  Lymphadenopathy:    She has no cervical adenopathy.  Neurological: She is alert and oriented to person, place, and time. She displays normal reflexes. No sensory deficit. She exhibits normal muscle tone. Coordination normal.  Skin: Skin is warm and dry. Capillary refill takes less than 2 seconds.  Psychiatric: She has a normal mood and affect.     ED Treatments / Results   Vitals:   12/29/16 2220 12/30/16 0000  BP: (!) 165/70 122/63  Pulse: 80 70  Resp: 17 18  Temp:      Labs (all labs ordered are listed, but only abnormal results are displayed)  Results for orders placed or performed during the hospital encounter of 12/29/16  CBC with Differential/Platelet  Result Value Ref Range   WBC 10.3 4.0 - 10.5 K/uL   RBC 4.42 3.87 - 5.11 MIL/uL   Hemoglobin 12.9 12.0 - 15.0 g/dL   HCT 39.8 36.0 - 46.0 %   MCV 90.0 78.0 - 100.0 fL   MCH 29.2 26.0 - 34.0 pg   MCHC 32.4 30.0 - 36.0 g/dL   RDW 12.4 11.5 - 15.5 %   Platelets 163 150 - 400 K/uL   Neutrophils Relative % 81 %   Neutro Abs 8.4 (H) 1.7 - 7.7 K/uL   Lymphocytes Relative 14 %   Lymphs Abs 1.4  0.7 - 4.0 K/uL   Monocytes Relative 5 %   Monocytes Absolute 0.5 0.1 - 1.0 K/uL   Eosinophils Relative 0 %   Eosinophils Absolute 0.0 0.0 - 0.7 K/uL   Basophils Relative 0 %   Basophils Absolute 0.0 0.0 - 0.1 K/uL  Basic metabolic panel  Result Value Ref Range   Sodium 137 135 - 145 mmol/L   Potassium 3.9 3.5 - 5.1 mmol/L   Chloride 103 101 - 111 mmol/L   CO2 29 22 - 32 mmol/L   Glucose, Bld 123 (H) 65 - 99 mg/dL   BUN 11 6 - 20 mg/dL   Creatinine, Ser 1.06 (H) 0.44 - 1.00 mg/dL   Calcium 8.8 (L) 8.9 - 10.3 mg/dL   GFR calc non Af Amer 52 (L) >60 mL/min   GFR calc Af Amer >60 >60 mL/min   Anion gap 5 5 - 15  Troponin I  Result Value Ref Range   Troponin I <0.03 <0.03 ng/mL   Ct Abdomen Pelvis Wo Contrast  Result Date: 12/30/2016 CLINICAL DATA:  Left upper quadrant pain for 2 days. EXAM: CT ABDOMEN AND PELVIS WITHOUT CONTRAST TECHNIQUE: Multidetector CT imaging of the abdomen and pelvis was performed following the standard protocol without IV contrast. COMPARISON:  CT abdomen pelvis 05/03/2014 FINDINGS: Lower chest: There is bilateral dependent subsegmental atelectasis. Hepatobiliary: Normal hepatic size and contours. No perihepatic ascites. No intra- or extrahepatic biliary dilatation. Status post cholecystectomy. Pancreas: Normal  pancreatic contours. No peripancreatic fluid collection or pancreatic ductal dilatation. Spleen: Normal. Adrenals/Urinary Tract: Normal adrenal glands. No hydronephrosis or nephrolithiasis. No abnormal perinephric stranding. No ureteral obstruction. Stomach/Bowel: No abnormal bowel dilatation. No bowel wall thickening or adjacent fat stranding to indicate acute inflammation. No abdominal fluid collection. Normal appendix. Vascular/Lymphatic: There is atherosclerotic calcification of the non aneurysmal abdominal aorta. No abdominal or pelvic adenopathy. Reproductive: Status post hysterectomy. No adnexal lesion. No free fluid in the pelvis. Musculoskeletal: There is  posterior lumbar interbody fusion extending from L2-L5 with bilateral transpedicular screws at L2-L4. There is posterior decompression at these levels. Normal visualized extrathoracic and extraperitoneal soft tissues. Other: No contributory non-categorized findings. IMPRESSION: 1. No acute abnormality of the abdomen or pelvis. 2. Aortic atherosclerosis. Electronically Signed   By: Ulyses Jarred M.D.   On: 12/30/2016 00:02   Dg Abd Acute W/chest  Result Date: 12/29/2016 CLINICAL DATA:  Upper abdominal pain for the past 2 days. History of gastritis, hypertension, gastroesophageal reflux. EXAM: DG ABDOMEN ACUTE W/ 1V CHEST COMPARISON:  Chest x-ray of September 29, 2013 and CT scan of the abdomen and pelvis of May 03, 2014. FINDINGS: The lungs are well-expanded and clear. The heart and pulmonary vascularity are normal. The mediastinum is normal in width. There is calcification in the wall of the aortic arch. There is no pleural effusion. The bony thorax is unremarkable. Within the abdomen the colonic stool burden is normal. No small or large bowel obstructive pattern is observed. There surgical clips in the gallbladder fossa. The patient has undergone posterior and interbody fusion from L2 through L4 with L4-5 interbody fusion. There are no abnormal soft tissue calcifications. IMPRESSION: There is no evidence of bowel obstruction or ileus. No abnormal abdominal soft tissue calcifications are observed. Further evaluation with right upper quadrant abdominal ultrasound may be useful. No acute cardiopulmonary abnormality. Thoracic aortic atherosclerosis. Electronically Signed   By: David  Martinique M.D.   On: 12/29/2016 15:54    EKG  EKG Interpretation  Date/Time:  Wednesday Arrin Pintor 11 2018 21:24:35 EDT Ventricular Rate:  78 PR Interval:  142 QRS Duration: 82 QT Interval:  374 QTC Calculation: 426 R Axis:   12 Text Interpretation:  Normal sinus rhythm Confirmed by [facility]  MD, Lamyiah Crawshaw ([id number]) on 12/29/2016  11:03:46 PM       Radiology Ct Abdomen Pelvis Wo Contrast  Result Date: 12/30/2016 CLINICAL DATA:  Left upper quadrant pain for 2 days. EXAM: CT ABDOMEN AND PELVIS WITHOUT CONTRAST TECHNIQUE: Multidetector CT imaging of the abdomen and pelvis was performed following the standard protocol without IV contrast. COMPARISON:  CT abdomen pelvis 05/03/2014 FINDINGS: Lower chest: There is bilateral dependent subsegmental atelectasis. Hepatobiliary: Normal hepatic size and contours. No perihepatic ascites. No intra- or extrahepatic biliary dilatation. Status post cholecystectomy. Pancreas: Normal pancreatic contours. No peripancreatic fluid collection or pancreatic ductal dilatation. Spleen: Normal. Adrenals/Urinary Tract: Normal adrenal glands. No hydronephrosis or nephrolithiasis. No abnormal perinephric stranding. No ureteral obstruction. Stomach/Bowel: No abnormal bowel dilatation. No bowel wall thickening or adjacent fat stranding to indicate acute inflammation. No abdominal fluid collection. Normal appendix. Vascular/Lymphatic: There is atherosclerotic calcification of the non aneurysmal abdominal aorta. No abdominal or pelvic adenopathy. Reproductive: Status post hysterectomy. No adnexal lesion. No free fluid in the pelvis. Musculoskeletal: There is posterior lumbar interbody fusion extending from L2-L5 with bilateral transpedicular screws at L2-L4. There is posterior decompression at these levels. Normal visualized extrathoracic and extraperitoneal soft tissues. Other: No contributory non-categorized findings. IMPRESSION: 1. No acute abnormality of the  abdomen or pelvis. 2. Aortic atherosclerosis. Electronically Signed   By: Ulyses Jarred M.D.   On: 12/30/2016 00:02   Dg Abd Acute W/chest  Result Date: 12/29/2016 CLINICAL DATA:  Upper abdominal pain for the past 2 days. History of gastritis, hypertension, gastroesophageal reflux. EXAM: DG ABDOMEN ACUTE W/ 1V CHEST COMPARISON:  Chest x-ray of September 29, 2013 and CT scan of the abdomen and pelvis of May 03, 2014. FINDINGS: The lungs are well-expanded and clear. The heart and pulmonary vascularity are normal. The mediastinum is normal in width. There is calcification in the wall of the aortic arch. There is no pleural effusion. The bony thorax is unremarkable. Within the abdomen the colonic stool burden is normal. No small or large bowel obstructive pattern is observed. There surgical clips in the gallbladder fossa. The patient has undergone posterior and interbody fusion from L2 through L4 with L4-5 interbody fusion. There are no abnormal soft tissue calcifications. IMPRESSION: There is no evidence of bowel obstruction or ileus. No abnormal abdominal soft tissue calcifications are observed. Further evaluation with right upper quadrant abdominal ultrasound may be useful. No acute cardiopulmonary abnormality. Thoracic aortic atherosclerosis. Electronically Signed   By: David  Martinique M.D.   On: 12/29/2016 15:54    Procedures Procedures (including critical care time)  Medications Ordered in ED  Medications  dicyclomine (BENTYL) capsule 10 mg (not administered)  gi cocktail (Maalox,Lidocaine,Donnatal) (30 mLs Oral Given 12/29/16 2345)    Final Clinical Impressions(s) / ED Diagnoses  Gastritis:  Ruled out for MI with multiple days of pain with negative EKG and troponin.  Highly doubt PE in this patient as symptoms are in the epigastric and eating makes them worse.  Extremely gassy on exam.  Will start carafate and have patient follow up with GI for EGD and H Pylori testing. Exam, labs and vitals are benign and reassuring. The patient is nontoxic-appearing and imaging is negative for acute finding. Return for fevers, intractable pain, vomiting, hard abdomen inability to pass stool, rectal bleedingor any concerns.  After history, exam, and medical workup I feel the patient has been appropriately medically screened and is safe for discharge home.  Pertinent diagnoses were discussed with the patient. Patient was given return precautions. New Prescriptions New Prescriptions   No medications on file     Lourdez Mcgahan, MD 12/30/16 [phone]

## 2016-12-31 ENCOUNTER — Ambulatory Visit (INDEPENDENT_AMBULATORY_CARE_PROVIDER_SITE_OTHER): Payer: Medicare Other | Admitting: Family Medicine

## 2016-12-31 ENCOUNTER — Encounter: Payer: Self-pay | Admitting: Gastroenterology

## 2016-12-31 ENCOUNTER — Encounter: Payer: Self-pay | Admitting: Family Medicine

## 2016-12-31 VITALS — BP 119/64 | HR 60 | Temp 97.8°F | Resp 16 | Ht 66.0 in | Wt 181.2 lb

## 2016-12-31 DIAGNOSIS — R110 Nausea: Secondary | ICD-10-CM

## 2016-12-31 DIAGNOSIS — R1011 Right upper quadrant pain: Secondary | ICD-10-CM

## 2016-12-31 DIAGNOSIS — K2900 Acute gastritis without bleeding: Secondary | ICD-10-CM

## 2016-12-31 DIAGNOSIS — R1013 Epigastric pain: Secondary | ICD-10-CM | POA: Diagnosis not present

## 2016-12-31 NOTE — Progress Notes (Signed)
OFFICE VISIT  12/31/2016  CC:  Chief Complaint  Patient presents with  . Follow-up    Abdominal Pain   HPI:    Patient is a 71 y.o. Caucasian female who presents for 2 day f/u acute epigastric and RUQ pain--I dx'd her with gastritis. Labs 2 days ago here were all normal, as was an acute abdominal x-ray series.  I rx'd carafate tabs and started a PPI, also gave hemoccults cards X3 to take home. Yesterday she present to the ED b/c pain ongoing + temp 101 at home.  I have reviewed these records.  Exam documented as benign, EKG normal, CT abd/pelv normal.  Pt was given dx of gastritis again, and was rx'd carafate suspension and bentyl and it was recommended she go GI for possible EGD.  CT abd/pelv w/out contrast 12/30/16: IMPRESSION: 1. No acute abnormality of the abdomen or pelvis. 2. Aortic atherosclerosis.  She no longer hurts when sitting still.  When taking a deep breath she has epigastric pain and RUQ pain. No recurrence of fever.  She is taking carafate tabs, tylenol, PPI, and zantac qd.  Some intermittent nausea, no vomiting.  No diarrhea.  Has had a couple of BM since I last saw her and it was brown.  She has 2 of her hemoccult cards done. They are interested in GI referral ASAP.    ROS: no ST, no cough, no nasal sx's, no urinary complaints.    Past Medical History:  Diagnosis Date  . Allergic rhinitis   . Anxiety   . Chest pain    cr  . Chronic pain syndrome    right knee osteo, hx of TKA in R knee.  Pt was told by [name] that no further surgical options are available for her--[name]  . Chronic renal insufficiency, stage II (mild) 2015   CrCl in th 60s.  . Diverticulosis of colon    with hx of 'itis  . DJD (degenerative joint disease)    L spine and knees  . FATIGUE / MALAISE 07/01/2009   Chronic  . Fibromyalgia   . GERD (gastroesophageal reflux disease)   . History of colonic polyps   . HTN (hypertension)   . Hypercholesterolemia   .  Hypothyroid 07/05/2012  . IBS (irritable bowel syndrome)   . IC (interstitial cystitis)    [name] at [facility]  . Iron deficiency anemia, unspecified 06/27/2013   Has required IV iron on multiple occasions  . Lumbar back pain   . Major depression, recurrent ([place])    vs dysthymia,chronic  . Memory impairment 10/03/2012   Memory impairment 10/03/2012 >referral to neuro    . Monoclonal gammopathy of undetermined significance    IgM kappa MGUS ([name]) routine f/u has shown stability of this problem.  . OSA (obstructive sleep apnea)    does not have CPAP; Dr. Halford Chessman  . Osteopenia 04/2014   T score -2.0 FRAX 11%/1.8%  . Otitis externa, candida 24/2683   [name] tx'd her with clotrimazole 1% external solution x 1 mo  . Palpitations   . Psoriasis   . Vitamin D deficiency   . Xerostomia    and xerophthalmia--per [name] (he suggests SSA, SSB, ESR, rh factor, ANA, and antimicrosmal and antithyroglobulin ab's as of 02/17/16)    Past Surgical History:  Procedure Laterality Date  . ABDOMINAL HYSTERECTOMY     for prolapse with abdominal incision  . BACK SURGERY  [id number]   L2-3 fusion ([name])  .  CARDIAC CATHETERIZATION     no PCI  . CHOLECYSTECTOMY    . COLONOSCOPY  04/2006   Diverticulosis, o/w normal.  . ESOPHAGOGASTRODUODENOSCOPY  05/29/2009   Esoph normal.  Gastritis (H pylori NEG) + gastric polyps (benign).  Duodenal biopsy NORMAL.  [name] EYE SURGERY Left    "lazy eye"  . HAND SURGERY Right    TRIGGER FINGER; index finger  . HERNIA REPAIR     umbilical  . KNEE ARTHROSCOPY     RIGHT KNEE X 5  . LAMINOTOMY  2010   L-4, L-5 FUSION  . NASAL SINUS SURGERY     polypectomy (remote past)  . OOPHORECTOMY  2010   Laparoscopic BSO (path benign)  . PATELLA FRACTURE SURGERY  10-08   [name]  . POSTERIOR LAMINECTOMY / DECOMPRESSION LUMBAR SPINE  9-09   [name]  . TOTAL KNEE ARTHROPLASTY  12-07 and 4-07   [name], revision 12-09 [name] at [facility]    Outpatient  Medications Prior to Visit  Medication Sig Dispense Refill  . acetaminophen (TYLENOL) 325 MG tablet Take 650 mg by mouth every 6 (six) hours as needed.    . ALPRAZolam (XANAX) 0.5 MG tablet TAKE ONE-HALF TO ONE TABLET BY MOUTH THREE TIMES DAILY 90 tablet 5  . Azelastine HCl (ASTEPRO) 0.15 % SOLN 2 sprays each nostril every 12 hours (Patient taking differently: Place 2 sprays into the nose 2 (two) times daily as needed (Allergies). 2 sprays each nostril every 12 hours) 30 mL 11  . calcium carbonate (OSCAL) 1500 (600 Ca) MG TABS tablet Take by mouth daily.    . Cholecalciferol (VITAMIN D) 2000 UNITS CAPS Take 5 capsules by mouth daily.     . cyclobenzaprine (FLEXERIL) 5 MG tablet     . dicyclomine (BENTYL) 20 MG tablet Take 1 tablet (20 mg total) by mouth 2 (two) times daily. 20 tablet 0  . diphenhydrAMINE (BENADRYL) 25 MG tablet Take 25 mg by mouth at bedtime as needed.    . fish oil-omega-3 fatty acids 1000 MG capsule Take 2 g by mouth daily.     . hydrocortisone 2.5 % cream Apply 1 application topically as needed. RECTAL ITCHING    . Inositol Niacinate 500 MG CAPS Take 1 capsule by mouth at bedtime.    [name] levothyroxine (SYNTHROID, LEVOTHROID) 75 MCG tablet TAKE ONE-HALF TABLET BY MOUTH ONCE DAILY 45 tablet 1  . meclizine (ANTIVERT) 25 MG tablet Take 1 tablet (25 mg total) by mouth every 6 (six) hours as needed for dizziness. 360 tablet 3  . meloxicam (MOBIC) 7.5 MG tablet TAKE TWO TABLETS BY MOUTH ONCE DAILY 180 tablet 1  . metoprolol tartrate (LOPRESSOR) 25 MG tablet TAKE ONE TABLET BY MOUTH ONCE DAILY 90 tablet 1  . Multiple Vitamins-Iron (MULTIVITAMIN/IRON PO) Take 1 tablet by mouth daily.     [name] omeprazole (PRILOSEC) 40 MG capsule Take 1 capsule (40 mg total) by mouth daily. 30 capsule 3  . Oxycodone HCl 10 MG TABS 10 mg every 4 (four) hours as needed. 1 every 6 hours for pain    . Probiotic Product (ALIGN) 4 MG CAPS Take 1 capsule (4 mg total) by mouth daily. 60 capsule 0  . promethazine  (PHENERGAN) 25 MG tablet TAKE ONE TABLET BY MOUTH EVERY 6 HOURS AS NEEDED FOR NAUSEA AND VOMITING 90 tablet 3  . simvastatin (ZOCOR) 40 MG tablet Take 1 tablet (40 mg total) by mouth at bedtime. 90 tablet 3  . Specialty Vitamins Products (  MAGNESIUM, AMINO ACID CHELATE,) 133 MG tablet Take 1 tablet by mouth daily.    . sucralfate (CARAFATE) 1 GM/10ML suspension Take 10 mLs (1 g total) by mouth 4 (four) times daily -  with meals and at bedtime. (Patient not taking: Reported on 12/31/2016) 420 mL 0   No facility-administered medications prior to visit.     Allergies  Allergen Reactions  . Cymbalta [Duloxetine Hcl]     Facial edema  . Iohexol      Code: HIVES, Desc: pt states her face and throat swells when administered IV contrast - KB, Onset Date: [id number]   . Mepergan [Meperidine-Promethazine]     Cardiac arrest  . Morphine Rash  . Augmentin [Amoxicillin-Pot Clavulanate] Nausea Only  . Citalopram Other (See Comments)    Jittery/heart racing   . Hctz [Hydrochlorothiazide] Other (See Comments)    Too much urinating, felt dried out, etc--NO ALLERGY  . Lexapro [Escitalopram Oxalate] Other (See Comments)    Jittery,heart racing  . Lyrica [Pregabalin] Swelling    Face, hands, fingers, ankles  . Valsartan     REACTION: pt states INTOL to Diovan    ROS As per HPI  PE: Blood pressure 119/64, pulse 60, temperature 97.8 F (36.6 C), temperature source Oral, resp. rate 16, height 5' 6" (1.676 m), weight 181 lb 4 oz (82.2 kg), SpO2 93 %. Gen: Alert, well appearing.  Patient is oriented to person, place, time, and situation. AFFECT: pleasant, lucid thought and speech. TWS:FKCL: no injection, icteris, swelling, or exudate.  EOMI, PERRLA. Mouth: lips without lesion/swelling.  Oral mucosa pink and tacky. Oropharynx without erythema, exudate, or swelling.  Neck - No masses or thyromegaly or limitation in range of motion CV: RRR, no m/r/g.   LUNGS: CTA bilat, nonlabored resps, good aeration in  all lung fields. ABD: soft, moderate TTP in mid epigastric region/subxyphoid region and RUQ, with neg murphy's sign.  No guarding or rebound tenderness.  ND, BS normal.  No hepatospenomegaly or mass.  No bruits. EXT: no clubbing, cyanosis, or edema.      LABS:    Chemistry      Component Value Date/Time   NA 137 12/29/2016 2330   NA 138 11/25/2016 1147   K 3.9 12/29/2016 2330   K 3.8 11/25/2016 1147   CL 103 12/29/2016 2330   CL 103 01/15/2010 0835   CO2 29 12/29/2016 2330   CO2 25 11/25/2016 1147   BUN 11 12/29/2016 2330   BUN 15.3 11/25/2016 1147   CREATININE 1.06 (H) 12/29/2016 2330   CREATININE 0.8 11/25/2016 1147      Component Value Date/Time   CALCIUM 8.8 (L) 12/29/2016 2330   CALCIUM 9.2 11/25/2016 1147   ALKPHOS 98 12/29/2016 1501   ALKPHOS 99 11/25/2016 1147   AST 12 12/29/2016 1501   AST 24 11/25/2016 1147   ALT 16 12/29/2016 1501   ALT 30 11/25/2016 1147   BILITOT 0.9 12/29/2016 1501   BILITOT 0.61 11/25/2016 1147     Lab Results  Component Value Date   WBC 10.3 12/29/2016   HGB 12.9 12/29/2016   HCT 39.8 12/29/2016   MCV 90.0 12/29/2016   PLT 163 12/29/2016   Hemoccult x 2 neg today.  IMPRESSION AND PLAN:  Acute epigastric and RUQ pain: suspect gastritis. W/u with me and via the emergency department yesterday shows no evidence of competing dx at this time. Pt improving a little on PPI, H2 blocker, and carafate. NO NSAIDs. She is very anxious to be  seen by a GI MD so I submitted an urgent referral today.  An After Visit Summary was printed and given to the patient.  FOLLOW UP: Return for to be determined based on date and results of GI eval.  Signed:  Crissie Sickles, MD           12/31/2016

## 2016-12-31 NOTE — Progress Notes (Signed)
Pre visit review using our clinic review tool, if applicable. No additional management support is needed unless otherwise documented below in the visit note. 

## 2017-01-10 ENCOUNTER — Other Ambulatory Visit: Payer: Self-pay | Admitting: Family Medicine

## 2017-01-10 DIAGNOSIS — M62830 Muscle spasm of back: Secondary | ICD-10-CM | POA: Diagnosis not present

## 2017-01-10 DIAGNOSIS — M961 Postlaminectomy syndrome, not elsewhere classified: Secondary | ICD-10-CM | POA: Diagnosis not present

## 2017-01-10 DIAGNOSIS — K5900 Constipation, unspecified: Secondary | ICD-10-CM | POA: Diagnosis not present

## 2017-01-10 DIAGNOSIS — G894 Chronic pain syndrome: Secondary | ICD-10-CM | POA: Diagnosis not present

## 2017-01-10 NOTE — Telephone Encounter (Signed)
[facility] [employer].  RF request for levothyroxine LOV: 06/04/15 Next ov: 01/20/17 Last written: 06/16/16 #45 w/ 1RF  02/24/16 TSH 0.97  Must keep apt on 01/20/17 for more refills.

## 2017-01-13 ENCOUNTER — Encounter: Payer: Self-pay | Admitting: Family Medicine

## 2017-01-13 ENCOUNTER — Other Ambulatory Visit: Payer: Medicare Other

## 2017-01-13 DIAGNOSIS — R1011 Right upper quadrant pain: Secondary | ICD-10-CM

## 2017-01-13 DIAGNOSIS — R1013 Epigastric pain: Secondary | ICD-10-CM

## 2017-01-13 LAB — HEMOCCULT SLIDES (X 3 CARDS)
OCCULT 1: NEGATIVE
OCCULT 2: NEGATIVE

## 2017-01-18 ENCOUNTER — Institutional Professional Consult (permissible substitution): Payer: BLUE CROSS/BLUE SHIELD | Admitting: Pulmonary Disease

## 2017-01-18 ENCOUNTER — Encounter: Payer: Self-pay | Admitting: Internal Medicine

## 2017-01-18 ENCOUNTER — Ambulatory Visit (INDEPENDENT_AMBULATORY_CARE_PROVIDER_SITE_OTHER): Payer: Medicare Other | Admitting: Internal Medicine

## 2017-01-18 VITALS — BP 116/78 | HR 72 | Resp 16 | Ht 66.0 in | Wt 178.4 lb

## 2017-01-18 DIAGNOSIS — R682 Dry mouth, unspecified: Secondary | ICD-10-CM

## 2017-01-18 DIAGNOSIS — G4733 Obstructive sleep apnea (adult) (pediatric): Secondary | ICD-10-CM

## 2017-01-18 NOTE — Progress Notes (Addendum)
01/18/2017- 71 year old female never smoker with history OSA last seen in 2010 and now here to reestablish. FOLLOW UP FOR OSA WITH HOME HEALTH CARE Other problems include HBP, GERD, hypothyroid, degenerative disc disease NPSG- 07/25/09-Complex Sleep Apnea with both obstructive and central apnea events, distinctly worse while supine and in rem. AHI 25 (24 central apneas, 19 mixed apneas and the remainder obstructive). REM AHI 77/hour. Desaturation to 74%, body weight 190 pounds She tried wearing CPAP after initial diagnosis but struggled with persistent mask leak, settling on a fullface mask. She eventually stop CPAP because of cost with no insurance at that time. She does remember CPAP did help sleep quality. Now she complains of always feeling tired. Husband has witnessed apneas and tells her she snores loudly. Bedtime around 9:30 PM but consistently waking around 11:30 and again around 3 AM. Takes melatonin and Xanax 0.5 mg at bedtime. Takes oxycodone, Tylenol, meloxicam when needed during the day for pain related to complicated right knee replacement and old spinal fusions. Little caffeine. No naps feeling that she couldn't sleep if she wanted to. Blames pain medications for dry mouth and hoarseness. We reviewed her medical list noting several antihistamines which might contribute to this. Also history of GERD. ENT surgery-left nasal polyp with sinus obstruction repaired and no problems since.  04/06/17-Addendum to update-we have made a series of adjustments in CPAP pressure using auto titration but have been unable to provide adequate control of her complex obstructive/central events at pressures that she can tolerate. Most recently auto range 5-18 has resulted in complaints of swallowed air and gas discomfort. Her home study, like the original NPSG, shown a complex apnea pattern with obstructive and central events. I think we have to hope we can with AutoPap. I am going to recommend a trial of ASV:  5-12, PS min 4, max 12, Breath Rate auto BrPM.  Prior to Admission medications   Medication Sig Start Date End Date Taking? Authorizing Provider  acetaminophen (TYLENOL) 325 MG tablet Take 650 mg by mouth every 6 (six) hours as needed.   Yes Historical Provider, MD  ALPRAZolam (XANAX) 0.5 MG tablet TAKE ONE-HALF TO ONE TABLET BY MOUTH THREE TIMES DAILY 05/18/16  Yes [name], MD  Azelastine HCl (ASTEPRO) 0.15 % SOLN 2 sprays each nostril every 12 hours Patient taking differently: Place 2 sprays into the nose 2 (two) times daily as needed (Allergies). 2 sprays each nostril every 12 hours 07/19/14  Yes [name], MD  calcium carbonate (OSCAL) 1500 (600 Ca) MG TABS tablet Take by mouth daily.   Yes Historical Provider, MD  Cholecalciferol (VITAMIN D) 2000 UNITS CAPS Take 5 capsules by mouth daily.    Yes Historical Provider, MD  cyclobenzaprine (FLEXERIL) 5 MG tablet  05/09/15  Yes Historical Provider, MD  dicyclomine (BENTYL) 20 MG tablet Take 1 tablet (20 mg total) by mouth 2 (two) times daily. 12/30/16  Yes April Palumbo, MD  diphenhydrAMINE (BENADRYL) 25 MG tablet Take 25 mg by mouth at bedtime as needed.   Yes Historical Provider, MD  fish oil-omega-3 fatty acids 1000 MG capsule Take 2 g by mouth daily.    Yes Historical Provider, MD  hydrocortisone 2.5 % cream Apply 1 application topically as needed. RECTAL ITCHING 09/08/12  Yes Historical Provider, MD  Inositol Niacinate 500 MG CAPS Take 1 capsule by mouth at bedtime.   Yes Historical Provider, MD  levothyroxine (SYNTHROID, LEVOTHROID) 75 MCG tablet TAKE ONE-HALF TABLET BY MOUTH ONCE DAILY 01/10/17  Yes  [name], MD  meclizine (ANTIVERT) 25 MG tablet Take 1 tablet (25 mg total) by mouth every 6 (six) hours as needed for dizziness. 11/17/15  Yes [name], MD  meloxicam (MOBIC) 7.5 MG tablet TAKE TWO TABLETS BY MOUTH ONCE DAILY 12/21/16  Yes [name], MD  metoprolol tartrate (LOPRESSOR) 25 MG tablet TAKE ONE  TABLET BY MOUTH ONCE DAILY 06/16/16  Yes [name], MD  Multiple Vitamins-Iron (MULTIVITAMIN/IRON PO) Take 1 tablet by mouth daily.    Yes Historical Provider, MD  omeprazole (PRILOSEC) 40 MG capsule Take 1 capsule (40 mg total) by mouth daily. 12/29/16  Yes [name], MD  Oxycodone HCl 10 MG TABS 10 mg every 4 (four) hours as needed. 1 every 6 hours for pain 12/01/15  Yes Historical Provider, MD  Probiotic Product (ALIGN) 4 MG CAPS Take 1 capsule (4 mg total) by mouth daily. 10/26/16  Yes Renee A Kuneff, DO  promethazine (PHENERGAN) 25 MG tablet TAKE ONE TABLET BY MOUTH EVERY 6 HOURS AS NEEDED FOR NAUSEA AND VOMITING 11/29/16  Yes [name], MD  simvastatin (ZOCOR) 40 MG tablet Take 1 tablet (40 mg total) by mouth at bedtime. 09/10/15  Yes [name], MD  Specialty Vitamins Products (MAGNESIUM, AMINO ACID CHELATE,) 133 MG tablet Take 1 tablet by mouth daily.   Yes Historical Provider, MD  sucralfate (CARAFATE) 1 g tablet Take 1 tablet by mouth 4 (four) times daily as needed. 12/29/16  Yes Historical Provider, MD   Past Medical History:  Diagnosis Date  . Allergic rhinitis   . Anxiety   . Chest pain    cr  . Chronic pain syndrome    right knee osteo, hx of TKA in R knee.  Pt was told by [name] that no further surgical options are available for her--[name]  . Chronic renal insufficiency, stage II (mild) 2015   CrCl in th 60s.  . Diverticulosis of colon    with hx of 'itis  . DJD (degenerative joint disease)    L spine and knees  . FATIGUE / MALAISE 07/01/2009   Chronic  . Fibromyalgia   . GERD (gastroesophageal reflux disease)   . History of colonic polyps   . HTN (hypertension)   . Hypercholesterolemia   . Hypothyroid 07/05/2012  . IBS (irritable bowel syndrome)   . IC (interstitial cystitis)    [name] at [facility]  . Iron deficiency anemia, unspecified 06/27/2013   Has required IV iron on multiple occasions.  Hemoccults neg 12/2016.   . Lumbar back pain   . Major depression, recurrent ([place])    vs dysthymia,chronic  . Memory impairment 10/03/2012   Memory impairment 10/03/2012 >referral to neuro    . Monoclonal gammopathy of undetermined significance    IgM kappa MGUS ([name]) routine f/u has shown stability of this problem.  . OSA (obstructive sleep apnea)    does not have CPAP; Dr. Halford Chessman  . Osteopenia 04/2014   T score -2.0 FRAX 11%/1.8%  . Otitis externa, candida 65/4650   [name] tx'd her with clotrimazole 1% external solution x 1 mo  . Palpitations   . Psoriasis   . Vitamin D deficiency   . Xerostomia    and xerophthalmia--per [name] (he suggests SSA, SSB, ESR, rh factor, ANA, and antimicrosmal and antithyroglobulin ab's as of 02/17/16)   Past Surgical History:  Procedure Laterality Date  . ABDOMINAL HYSTERECTOMY     for  prolapse with abdominal incision  . BACK SURGERY  [id number]   L2-3 fusion ([name])  . CARDIAC CATHETERIZATION     no PCI  . CHOLECYSTECTOMY    . COLONOSCOPY  04/2006   Diverticulosis, o/w normal.  . ESOPHAGOGASTRODUODENOSCOPY  05/29/2009   Esoph normal.  Gastritis (H pylori NEG) + gastric polyps (benign).  Duodenal biopsy NORMAL.  [name] EYE SURGERY Left    "lazy eye"  . HAND SURGERY Right    TRIGGER FINGER; index finger  . HERNIA REPAIR     umbilical  . KNEE ARTHROSCOPY     RIGHT KNEE X 5  . LAMINOTOMY  2010   L-4, L-5 FUSION  . NASAL SINUS SURGERY     polypectomy (remote past)  . OOPHORECTOMY  2010   Laparoscopic BSO (path benign)  . PATELLA FRACTURE SURGERY  10-08   [name]  . POSTERIOR LAMINECTOMY / DECOMPRESSION LUMBAR SPINE  9-09   [name]  . TOTAL KNEE ARTHROPLASTY  12-07 and 4-07   [name], revision 12-09 [name] at [facility]   Family History  Problem Relation Age of Onset  . Leukemia Brother   . Lung disease Brother   . Melanoma Brother   . Hypertension Mother   . Thyroid disease Mother   . Hypertension Father   . Breast cancer Sister     Age 48's   . Hypertension Sister   . Coronary artery disease Sister   . Lupus Sister   . Thyroid disease Sister   . Thyroid disease Brother   . Cancer Other    Social History   Social History  . Marital status: Married    Spouse name: Soila Printup  . Number of children: 0  . Years of education: N/A   Occupational History  .  Retired   Social History Main Topics  . Smoking status: Never Smoker  . Smokeless tobacco: Never Used     Comment: NEVER USED TOBACCO  . Alcohol use No  . Drug use: No  . Sexual activity: Not Currently    Birth control/ protection: Surgical     Comment: 1st intercourse 71 yo-Fewer than 5 partners   Other Topics Concern  . Not on file   Social History Narrative   Married, no children.   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   No Tob.  No alc.  No drugs.   Orig from [place].   ROS-see HPI    "+" = pos Constitutional:    weight loss, night sweats, fevers, chills,+ fatigue, lassitude. HEENT:    headaches, difficulty swallowing, tooth/dental problems, sore throat,       sneezing, itching, ear ache, nasal congestion, post nasal drip, snoring CV:    chest pain, orthopnea, PND, swelling in lower extremities, anasarca,                                                     dizziness, palpitations Resp:   shortness of breath with exertion or at rest.                productive cough,   non-productive cough, coughing up of blood.              change in color of mucus.  wheezing.   Skin:    rash or lesions. GI:  No-  heartburn, indigestion, abdominal pain, nausea, vomiting, diarrhea,                 change in bowel habits, loss of appetite GU: dysuria, change in color of urine, no urgency or frequency.   flank pain. MS:   joint pain, stiffness, decreased range of motion, +back pain. Neuro-     nothing unusual Psych:  change in mood or affect.  depression or anxiety.   memory loss.  OBJ- Physical Exam General- Alert, Oriented, Affect-appropriate,  Distress- none acute Skin- rash-none, lesions- none, excoriation- none Lymphadenopathy- none Head- atraumatic            Eyes- Gross vision intact, PERRLA, conjunctivae and secretions clear            Ears- Hearing, canals-normal            Nose- Clear, no-Septal dev, mucus, polyps, erosion, perforation             Throat- Mallampati II-III , mucosa clear , drainage- none, tonsils- atrophic, + Dry mouth, dental repair Neck- flexible , trachea midline, no stridor , thyroid nl, carotid no bruit Chest - symmetrical excursion , unlabored           Heart/CV- RRR , no murmur , no gallop  , no rub, nl s1 s2                           - JVD- none , edema- none, stasis changes- none, varices- none           Lung- clear to P&A, wheeze- none, cough- none , dullness-none, rub- none           Chest wall-  Abd-  Br/ Gen/ Rectal- Not done, not indicated Extrem- cyanosis- none, clubbing, none, atrophy- none, strength- nl Neuro- grossly intact to observation

## 2017-01-18 NOTE — Assessment & Plan Note (Signed)
Exam and history strongly suggest persistent obstructive sleep apnea. Sleep quality is disrupted additionally by somatic pain. We may be helpful manage an insomnia component but we needed new sleep study to requalify her and establish current pattern. I anticipate CPAP will be our first choice of therapy again and humidification may actually help her.

## 2017-01-18 NOTE — Assessment & Plan Note (Signed)
Several of her medications, including antihistamines, likely contributed to dry mouth. Her dentist has expressed concern because of tooth damage.

## 2017-01-18 NOTE — Patient Instructions (Signed)
Order- schedule unattended Home Sleep Test      Dx OSA  We will set up a return visit to follow up with you once we know when the home study is scheduled.

## 2017-01-19 ENCOUNTER — Ambulatory Visit: Payer: Medicare Other | Admitting: Family Medicine

## 2017-01-20 ENCOUNTER — Ambulatory Visit (INDEPENDENT_AMBULATORY_CARE_PROVIDER_SITE_OTHER): Payer: Medicare Other | Admitting: Family Medicine

## 2017-01-20 ENCOUNTER — Other Ambulatory Visit: Payer: Self-pay | Admitting: *Deleted

## 2017-01-20 ENCOUNTER — Encounter: Payer: Self-pay | Admitting: Family Medicine

## 2017-01-20 VITALS — BP 138/75 | HR 65 | Temp 97.9°F | Resp 16 | Ht 66.0 in | Wt 178.5 lb

## 2017-01-20 DIAGNOSIS — E7800 Pure hypercholesterolemia, unspecified: Secondary | ICD-10-CM | POA: Diagnosis not present

## 2017-01-20 DIAGNOSIS — Z0000 Encounter for general adult medical examination without abnormal findings: Secondary | ICD-10-CM | POA: Diagnosis not present

## 2017-01-20 DIAGNOSIS — G9332 Myalgic encephalomyelitis/chronic fatigue syndrome: Secondary | ICD-10-CM

## 2017-01-20 DIAGNOSIS — E039 Hypothyroidism, unspecified: Secondary | ICD-10-CM | POA: Diagnosis not present

## 2017-01-20 DIAGNOSIS — E2839 Other primary ovarian failure: Secondary | ICD-10-CM

## 2017-01-20 DIAGNOSIS — M797 Fibromyalgia: Secondary | ICD-10-CM | POA: Diagnosis not present

## 2017-01-20 DIAGNOSIS — R5382 Chronic fatigue, unspecified: Secondary | ICD-10-CM

## 2017-01-20 DIAGNOSIS — I10 Essential (primary) hypertension: Secondary | ICD-10-CM

## 2017-01-20 DIAGNOSIS — M8580 Other specified disorders of bone density and structure, unspecified site: Secondary | ICD-10-CM

## 2017-01-20 LAB — TSH: TSH: 1.06 u[IU]/mL (ref 0.35–4.50)

## 2017-01-20 MED ORDER — DICYCLOMINE HCL 20 MG PO TABS: 20.0000 mg | ORAL_TABLET | Freq: Two times a day (BID) | ORAL | 3 refills | Status: DC

## 2017-01-20 MED ORDER — ALPRAZOLAM 0.5 MG PO TABS: ORAL_TABLET | ORAL | 1 refills | Status: DC

## 2017-01-20 NOTE — Progress Notes (Signed)
Pre visit review using our clinic review tool, if applicable. No additional management support is needed unless otherwise documented below in the visit note. 

## 2017-01-20 NOTE — Progress Notes (Signed)
OFFICE VISIT  01/20/2017   CC:  Chief Complaint  Patient presents with  . Follow-up    RCI, pt is not fasting.    HPI:    Patient is a 71 y.o. Caucasian female who presents for f/u chronic problems/polypharmacy. I last saw her for this 09/10/15.  She has had several acute visits in the interval. One of these is recent acute gastritis, referred her to GI in addition to giving acute treatment, has GI appointment 02/01/17.  HTN: home bp/HR monitoring: bp's consistently normal, but after a HR in the 40s during which she had lightheadedness, she decreased her toprol xl to 1/2 tab qd and bp has still been normal and HR high 50s-60s.  Chronic fatigue syndrome and fibromyalgia syndrome: she goes to an acupuncturist when she has acute flares of fibromyalgia.  Has had adverse psych and physical side effects from cymbalta and lyrica.  Doesn't want to try any further meds for this.  She sees a pain mgmt specialist and gets oxycodone.  Hyperlipidemia: takes simvastatin daily.  Not sure if any side effects from this med.  Has spent a period of time off of this med to see if she felt improvement in body pain but didn't notice a difference.  She has not been taking simva lately since having her gastritis.  Hypothyroidism: taking med correctly.    Past Medical History:  Diagnosis Date  . Allergic rhinitis   . Anxiety   . Chest pain    cr  . Chronic pain syndrome    right knee osteo, hx of TKA in R knee.  Pt was told by [name] that no further surgical options are available for her--[name]  . Chronic renal insufficiency, stage II (mild) 2015   CrCl in th 60s.  . Diverticulosis of colon    with hx of 'itis  . DJD (degenerative joint disease)    L spine and knees  . FATIGUE / MALAISE 07/01/2009   Chronic  . Fibromyalgia   . GERD (gastroesophageal reflux disease)   . History of colonic polyps   . HTN (hypertension)   . Hypercholesterolemia   . Hypothyroid 07/05/2012  .  IBS (irritable bowel syndrome)   . IC (interstitial cystitis)    [name] at [facility]  . Iron deficiency anemia, unspecified 06/27/2013   Has required IV iron on multiple occasions.  Hemoccults neg 12/2016.  . Lumbar back pain   . Major depression, recurrent ([place])    vs dysthymia,chronic  . Memory impairment 10/03/2012   Memory impairment 10/03/2012 >referral to neuro    . Monoclonal gammopathy of undetermined significance    IgM kappa MGUS ([name]) routine f/u has shown stability of this problem.  . OSA (obstructive sleep apnea)    does not have CPAP; Dr. Halford Chessman  . Osteopenia 04/2014   T score -2.0 FRAX 11%/1.8%  . Otitis externa, candida 82/9562   [name] tx'd her with clotrimazole 1% external solution x 1 mo  . Palpitations   . Psoriasis   . Vitamin D deficiency   . Xerostomia    and xerophthalmia--per [name] (he suggests SSA, SSB, ESR, rh factor, ANA, and antimicrosmal and antithyroglobulin ab's as of 02/17/16)    Past Surgical History:  Procedure Laterality Date  . ABDOMINAL HYSTERECTOMY     for prolapse with abdominal incision  . BACK SURGERY  [id number]   L2-3 fusion ([name])  . CARDIAC CATHETERIZATION     no PCI  .  CHOLECYSTECTOMY    . COLONOSCOPY  04/2006   Diverticulosis, o/w normal.  . ESOPHAGOGASTRODUODENOSCOPY  05/29/2009   Esoph normal.  Gastritis (H pylori NEG) + gastric polyps (benign).  Duodenal biopsy NORMAL.  [name] EYE SURGERY Left    "lazy eye"  . HAND SURGERY Right    TRIGGER FINGER; index finger  . HERNIA REPAIR     umbilical  . KNEE ARTHROSCOPY     RIGHT KNEE X 5  . LAMINOTOMY  2010   L-4, L-5 FUSION  . NASAL SINUS SURGERY     polypectomy (remote past)  . OOPHORECTOMY  2010   Laparoscopic BSO (path benign)  . PATELLA FRACTURE SURGERY  10-08   [name]  . POSTERIOR LAMINECTOMY / DECOMPRESSION LUMBAR SPINE  9-09   [name]  . TOTAL KNEE ARTHROPLASTY  12-07 and 4-07   [name], revision 12-09 [name] at [facility]    Outpatient  Medications Prior to Visit  Medication Sig Dispense Refill  . acetaminophen (TYLENOL) 325 MG tablet Take 650 mg by mouth every 6 (six) hours as needed.    . Azelastine HCl (ASTEPRO) 0.15 % SOLN 2 sprays each nostril every 12 hours (Patient taking differently: Place 2 sprays into the nose 2 (two) times daily as needed (Allergies). 2 sprays each nostril every 12 hours) 30 mL 11  . calcium carbonate (OSCAL) 1500 (600 Ca) MG TABS tablet Take by mouth daily.    . Cholecalciferol (VITAMIN D) 2000 UNITS CAPS Take 5 capsules by mouth daily.     . cyclobenzaprine (FLEXERIL) 5 MG tablet     . diphenhydrAMINE (BENADRYL) 25 MG tablet Take 25 mg by mouth at bedtime as needed.    . fish oil-omega-3 fatty acids 1000 MG capsule Take 2 g by mouth daily.     . hydrocortisone 2.5 % cream Apply 1 application topically as needed. RECTAL ITCHING    . Inositol Niacinate 500 MG CAPS Take 1 capsule by mouth at bedtime.    [name] levothyroxine (SYNTHROID, LEVOTHROID) 75 MCG tablet TAKE ONE-HALF TABLET BY MOUTH ONCE DAILY 45 tablet 0  . meclizine (ANTIVERT) 25 MG tablet Take 1 tablet (25 mg total) by mouth every 6 (six) hours as needed for dizziness. 360 tablet 3  . meloxicam (MOBIC) 7.5 MG tablet TAKE TWO TABLETS BY MOUTH ONCE DAILY 180 tablet 1  . metoprolol tartrate (LOPRESSOR) 25 MG tablet TAKE ONE TABLET BY MOUTH ONCE DAILY 90 tablet 1  . Multiple Vitamins-Iron (MULTIVITAMIN/IRON PO) Take 1 tablet by mouth daily.     [name] omeprazole (PRILOSEC) 40 MG capsule Take 1 capsule (40 mg total) by mouth daily. 30 capsule 3  . Oxycodone HCl 10 MG TABS 10 mg every 4 (four) hours as needed. 1 every 6 hours for pain    . Probiotic Product (ALIGN) 4 MG CAPS Take 1 capsule (4 mg total) by mouth daily. 60 capsule 0  . promethazine (PHENERGAN) 25 MG tablet TAKE ONE TABLET BY MOUTH EVERY 6 HOURS AS NEEDED FOR NAUSEA AND VOMITING 90 tablet 3  . simvastatin (ZOCOR) 40 MG tablet Take 1 tablet (40 mg total) by mouth at bedtime. 90 tablet 3  .  Specialty Vitamins Products (MAGNESIUM, AMINO ACID CHELATE,) 133 MG tablet Take 1 tablet by mouth daily.    . sucralfate (CARAFATE) 1 g tablet Take 1 tablet by mouth 4 (four) times daily as needed.    . ALPRAZolam (XANAX) 0.5 MG tablet TAKE ONE-HALF TO ONE TABLET BY MOUTH THREE TIMES DAILY  90 tablet 5  . dicyclomine (BENTYL) 20 MG tablet Take 1 tablet (20 mg total) by mouth 2 (two) times daily. 20 tablet 0   No facility-administered medications prior to visit.     Allergies  Allergen Reactions  . Cymbalta [Duloxetine Hcl]     Facial edema  . Iohexol      Code: HIVES, Desc: pt states her face and throat swells when administered IV contrast - KB, Onset Date: [id number]   . Mepergan [Meperidine-Promethazine]     Cardiac arrest  . Morphine Rash  . Augmentin [Amoxicillin-Pot Clavulanate] Nausea Only  . Citalopram Other (See Comments)    Jittery/heart racing   . Hctz [Hydrochlorothiazide] Other (See Comments)    Too much urinating, felt dried out, etc--NO ALLERGY  . Lexapro [Escitalopram Oxalate] Other (See Comments)    Jittery,heart racing  . Lyrica [Pregabalin] Swelling    Face, hands, fingers, ankles  . Valsartan     REACTION: pt states INTOL to Diovan    ROS As per HPI  PE: Blood pressure 138/75, pulse 65, temperature 97.9 F (36.6 C), temperature source Oral, resp. rate 16, height _0  (1.676 m), weight 178 lb 8 oz (81 kg), SpO2 96 %. Gen: Alert, well appearing.  Patient is oriented to person, place, time, and situation. AFFECT: pleasant, lucid thought and speech. CV: RRR, no m/r/g.   LUNGS: CTA bilat, nonlabored resps, good aeration in all lung fields. EXT: no clubbing, cyanosis, or edema.   LABS:  Lab Results  Component Value Date   TSH 0.97 02/24/2016   Lab Results  Component Value Date   WBC 10.3 12/29/2016   HGB 12.9 12/29/2016   HCT 39.8 12/29/2016   MCV 90.0 12/29/2016   PLT 163 12/29/2016   Lab Results  Component Value Date   CREATININE 1.06 (H)  12/29/2016   BUN 11 12/29/2016   NA 137 12/29/2016   K 3.9 12/29/2016   CL 103 12/29/2016   CO2 29 12/29/2016   Lab Results  Component Value Date   ALT 16 12/29/2016   AST 12 12/29/2016   ALKPHOS 98 12/29/2016   BILITOT 0.9 12/29/2016   Lab Results  Component Value Date   CHOL 209 (H) 09/23/2014   Lab Results  Component Value Date   HDL 36.60 (L) 09/23/2014   Lab Results  Component Value Date   LDLCALC 138 (H) 09/23/2014   Lab Results  Component Value Date   TRIG 172.0 (H) 09/23/2014   Lab Results  Component Value Date   CHOLHDL 6 09/23/2014    IMPRESSION AND PLAN:  1) Hypothyroidism: due for recheck TSH.   The current medical regimen is effective;  continue present plan and medications.  2) HTN: The current medical regimen is effective;  continue present plan and medications. (1/2 of 54m toprol xl qd). Lytes/cr good less than a month ago.  3) Hyperlipidemia: usually takes statin, but has stopped this for about the last 6 weeks b/c she was afraid to take it with her gastritis. Will plan on her getting back on this med in near future and we'll plan recheck fasting lipid panel at next f/u in 6 mo. Hepatic panel good less than a month ago.  4) Chronic fatigue syndrome and fibromyalgia syndrome: still hurting/tired all the time, but she is not interested in trial of any additional med for this condition at this time--mainly due to poor tolerance of the meds in the past.  Unfortunately, since she has [employer] is  not eligible to go to [facility] [place].  She will continue to go to her chiropracter prn "flares".  5) Preventative health: screening for osteoporosis/DEXA due--she says she'll make appt with Dr. Wayne Both, her GYN, for this.  An After Visit Summary was printed and given to the patient.  FOLLOW UP: Return in about 6 months (around 07/23/2017) for routine chronic illness f/u (fasting).  Signed:  Crissie Sickles, MD            01/20/2017

## 2017-01-21 ENCOUNTER — Encounter: Payer: Self-pay | Admitting: *Deleted

## 2017-01-21 ENCOUNTER — Encounter: Payer: Self-pay | Admitting: Family Medicine

## 2017-01-27 ENCOUNTER — Other Ambulatory Visit: Payer: Self-pay | Admitting: Gynecology

## 2017-01-27 DIAGNOSIS — Z780 Asymptomatic menopausal state: Secondary | ICD-10-CM

## 2017-01-28 ENCOUNTER — Institutional Professional Consult (permissible substitution): Payer: BLUE CROSS/BLUE SHIELD | Admitting: Pulmonary Disease

## 2017-02-01 ENCOUNTER — Encounter: Payer: Self-pay | Admitting: Gastroenterology

## 2017-02-01 ENCOUNTER — Ambulatory Visit (INDEPENDENT_AMBULATORY_CARE_PROVIDER_SITE_OTHER): Payer: Medicare Other | Admitting: Gastroenterology

## 2017-02-01 VITALS — BP 130/70 | HR 68 | Ht 66.5 in | Wt 177.2 lb

## 2017-02-01 DIAGNOSIS — R1013 Epigastric pain: Secondary | ICD-10-CM

## 2017-02-01 DIAGNOSIS — G8929 Other chronic pain: Secondary | ICD-10-CM | POA: Diagnosis not present

## 2017-02-01 NOTE — Patient Instructions (Addendum)
If you are age 70 or older, your body mass index should be between 23-30. Your Body mass index is 28.17 kg/m. If this is out of the aforementioned range listed, please consider follow up with your Primary Care Provider.  If you are age 74 or younger, your body mass index should be between 19-25. Your Body mass index is 28.17 kg/m. If this is out of the aformentioned range listed, please consider follow up with your Primary Care Provider.   You have been scheduled for an endoscopy. Please follow written instructions given to you at your visit today. If you use inhalers (even only as needed), please bring them with you on the day of your procedure. Your physician has requested that you go to www.startemmi.com and enter the access code given to you at your visit today. This web site gives a general overview about your procedure. However, you should still follow specific instructions given to you by our office regarding your preparation for the procedure.  Thank you for choosing [facility]  Dr Wilfrid Lund III

## 2017-02-01 NOTE — Progress Notes (Signed)
[facility] Consult Note:  History: [name] 02/01/2017  Referring physician: [name], MD  Reason for consult/chief complaint: gastritis (pain in center of chest and under right breast today. onset in early April while at the beach , drove home, seen by PCP and then [facility] ED)   Subjective  HPI:  This is a 71 year old woman referred by Dr. [name] a primary care for epigastric pain. She describes it as a sharp or dull epigastric pain that is sometimes in the right upper quadrant. It apparently began suddenly evening after eating [place] food while she was vacationing at [employer]. She reports going to a local urgent care, and then driving [place] to be [place] [facility] [place]. She ruled out for cardiac cause, was given a PPI and Carafate. She has continued to take both of these, and the pain is slowly improving or at least becoming less frequent. Although she has a chronic pain syndrome, she does not take NSAIDs, including the [employer] she was recently prescribed. She feels that this pain might be reminiscent of a prior "ulcer". The details of that diagnosis are unknown. We do have a report from an upper endoscopy by Dr. [name] at [place] in 2010, at which time gastric polyps were found. Epigastric to RUQ pain.  Suddenly Began at beach after [place] food  She denies dysphagia, odynophagia, nausea, vomiting, early satiety or weight loss.   ROS:  Review of Systems  Constitutional: Positive for fatigue. Negative for appetite change and unexpected weight change.  HENT: Negative for mouth sores and voice change.   Eyes: Negative for pain and redness.  Respiratory: Negative for cough and shortness of breath.   Cardiovascular: Negative for chest pain and palpitations.  Genitourinary: Negative for dysuria and hematuria.  Musculoskeletal: Positive for arthralgias and back pain. Negative for myalgias.  Skin: Negative for pallor and rash.    Allergic/Immunologic: Positive for environmental allergies.  Neurological: Negative for weakness and headaches.  Hematological: Negative for adenopathy.  Psychiatric/Behavioral: Positive for dysphoric mood. The patient is nervous/anxious.      Past Medical History: Past Medical History:  Diagnosis Date  . Allergic rhinitis   . Anxiety   . Chest pain    cr  . Chronic pain syndrome    right knee osteo, hx of TKA in R knee.  Pt was told by [name] that no further surgical options are available for her--[name]  . Chronic renal insufficiency, stage II (mild) 2015   CrCl in th 60s.  . Diverticulosis of colon    with hx of 'itis  . DJD (degenerative joint disease)    L spine and knees  . FATIGUE / MALAISE 07/01/2009   Chronic  . Fibromyalgia   . GERD (gastroesophageal reflux disease)   . History of colonic polyps   . HTN (hypertension)   . Hypercholesterolemia   . Hypothyroid 07/05/2012  . IBS (irritable bowel syndrome)   . IC (interstitial cystitis)    [name] at [facility]  . Iron deficiency anemia, unspecified 06/27/2013   Has required IV iron on multiple occasions.  Hemoccults neg 12/2016.  . Lumbar back pain   . Major depression, recurrent ([place])    vs dysthymia,chronic  . Memory impairment 10/03/2012   Memory impairment 10/03/2012 >referral to neuro    . Monoclonal gammopathy of undetermined significance    IgM kappa MGUS ([name]) routine f/u has shown stability of this problem.  [name]  OSA (obstructive sleep apnea)    As of 01/2017 pulm re-eval, plan is for home sleep study and likely restart of CPAP.  [name] Osteopenia 04/2014   T score -2.0 FRAX 11%/1.8%  . Otitis externa, candida 78/2956   [name] tx'd her with clotrimazole 1% external solution x 1 mo  . Palpitations   . Psoriasis   . Vitamin D deficiency   . Xerostomia    and xerophthalmia--per [name] (he suggests SSA, SSB, ESR, rh factor, ANA, and antimicrosmal and antithyroglobulin  ab's as of 02/17/16)     Past Surgical History: Past Surgical History:  Procedure Laterality Date  . ABDOMINAL HYSTERECTOMY     for prolapse with abdominal incision  . BACK SURGERY  [id number]   L2-3 fusion ([name])  . CARDIAC CATHETERIZATION     no PCI  . CHOLECYSTECTOMY    . COLONOSCOPY  04/2006   Diverticulosis, o/w normal.  . ESOPHAGOGASTRODUODENOSCOPY  05/29/2009   Esoph normal.  Gastritis (H pylori NEG) + gastric polyps (benign).  Duodenal biopsy NORMAL.  [name] EYE SURGERY Left    "lazy eye"  . HAND SURGERY Right    TRIGGER FINGER; index finger  . HERNIA REPAIR     umbilical  . KNEE ARTHROSCOPY     RIGHT KNEE X 5  . LAMINOTOMY  2010   L-4, L-5 FUSION  . NASAL SINUS SURGERY     polypectomy (remote past)  . OOPHORECTOMY  2010   Laparoscopic BSO (path benign)  . PATELLA FRACTURE SURGERY  10-08   [name]  . POSTERIOR LAMINECTOMY / DECOMPRESSION LUMBAR SPINE  9-09   [name]  . TOTAL KNEE ARTHROPLASTY  12-07 and 4-07   [name], revision 12-09 [name] at [facility]     Family History: Family History  Problem Relation Age of Onset  . Leukemia Brother   . Lung disease Brother   . Thyroid disease Brother   . Melanoma Brother   . Hypertension Mother   . Thyroid disease Mother   . Hypertension Father   . Breast cancer Sister        Age 4's  . Hypertension Sister   . Coronary artery disease Sister   . Lupus Sister   . Thyroid disease Sister   . Stomach cancer Brother   . Cancer Other        "all over"    Social History: Social History   Social History  . Marital status: Married    Spouse name: Braylinn Gulden  . Number of children: 0  . Years of education: N/A   Occupational History  .  Retired   Social History Main Topics  . Smoking status: Never Smoker  . Smokeless tobacco: Never Used     Comment: NEVER USED TOBACCO  . Alcohol use No  . Drug use: No  . Sexual activity: Not Currently    Birth control/ protection: Surgical     Comment: 1st intercourse  71 yo-Fewer than 5 partners   Other Topics Concern  . None   Social History Narrative   Married, no children.   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   No Tob.  No alc.  No drugs.   Orig from [place].    Allergies: Allergies  Allergen Reactions  . Cymbalta [Duloxetine Hcl]     Facial edema  . Iohexol      Code: HIVES, Desc: pt states her face and throat swells when administered IV contrast - KB, Onset Date: [id number]   .  Mepergan [Meperidine-Promethazine]     Cardiac arrest  . Morphine Rash  . Augmentin [Amoxicillin-Pot Clavulanate] Nausea Only  . Citalopram Other (See Comments)    Jittery/heart racing   . Hctz [Hydrochlorothiazide] Other (See Comments)    Too much urinating, felt dried out, etc--NO ALLERGY  . Lexapro [Escitalopram Oxalate] Other (See Comments)    Jittery,heart racing  . Lyrica [Pregabalin] Swelling    Face, hands, fingers, ankles  . Valsartan     REACTION: pt states INTOL to Diovan    Outpatient Meds: Current Outpatient Prescriptions  Medication Sig Dispense Refill  . acetaminophen (TYLENOL) 325 MG tablet Take 650 mg by mouth every 6 (six) hours as needed.    . ALPRAZolam (XANAX) 0.5 MG tablet TAKE ONE-HALF TO ONE TABLET BY MOUTH THREE TIMES DAILY 270 tablet 1  . Azelastine HCl (ASTEPRO) 0.15 % SOLN 2 sprays each nostril every 12 hours (Patient taking differently: Place 2 sprays into the nose 2 (two) times daily as needed (Allergies). 2 sprays each nostril every 12 hours) 30 mL 11  . calcium carbonate (OSCAL) 1500 (600 Ca) MG TABS tablet Take by mouth daily.    . Cholecalciferol (VITAMIN D) 2000 UNITS CAPS Take 5 capsules by mouth daily.     . cyclobenzaprine (FLEXERIL) 5 MG tablet     . dicyclomine (BENTYL) 20 MG tablet Take 1 tablet (20 mg total) by mouth 2 (two) times daily. 60 tablet 3  . diphenhydrAMINE (BENADRYL) 25 MG tablet Take 25 mg by mouth at bedtime as needed.    . fish oil-omega-3 fatty acids 1000 MG capsule Take 2 g by  mouth daily.     . hydrocortisone 2.5 % cream Apply 1 application topically as needed. RECTAL ITCHING    . Inositol Niacinate 500 MG CAPS Take 1 capsule by mouth at bedtime.    [name] levothyroxine (SYNTHROID, LEVOTHROID) 75 MCG tablet TAKE ONE-HALF TABLET BY MOUTH ONCE DAILY 45 tablet 0  . meclizine (ANTIVERT) 25 MG tablet Take 1 tablet (25 mg total) by mouth every 6 (six) hours as needed for dizziness. 360 tablet 3  . meloxicam (MOBIC) 7.5 MG tablet TAKE TWO TABLETS BY MOUTH ONCE DAILY 180 tablet 1  . metoprolol tartrate (LOPRESSOR) 25 MG tablet TAKE ONE TABLET BY MOUTH ONCE DAILY 90 tablet 1  . Multiple Vitamins-Iron (MULTIVITAMIN/IRON PO) Take 1 tablet by mouth daily.     [name] omeprazole (PRILOSEC) 40 MG capsule Take 1 capsule (40 mg total) by mouth daily. 30 capsule 3  . Oxycodone HCl 10 MG TABS 10 mg every 4 (four) hours as needed. 1 every 6 hours for pain    . Probiotic Product (ALIGN) 4 MG CAPS Take 1 capsule (4 mg total) by mouth daily. 60 capsule 0  . promethazine (PHENERGAN) 25 MG tablet TAKE ONE TABLET BY MOUTH EVERY 6 HOURS AS NEEDED FOR NAUSEA AND VOMITING 90 tablet 3  . simvastatin (ZOCOR) 40 MG tablet Take 1 tablet (40 mg total) by mouth at bedtime. 90 tablet 3  . Specialty Vitamins Products (MAGNESIUM, AMINO ACID CHELATE,) 133 MG tablet Take 1 tablet by mouth daily.    . sucralfate (CARAFATE) 1 g tablet Take 1 tablet by mouth 4 (four) times daily as needed.     No current facility-administered medications for this visit.       ___________________________________________________________________ Objective   Exam:  BP 130/70   Pulse 68   Ht 5' 6.5" (1.689 m)   Wt 177 lb 3.2 oz (80.4 kg)  BMI 28.17 kg/m    General: this is a(n) Chronically ill-appearing woman with an antalgic gait, gets on exam table slowly but without assistance   Eyes: sclera anicteric, no redness  ENT: oral mucosa moist without lesions, no cervical or supraclavicular lymphadenopathy, good  dentition  CV: RRR without murmur, S1/S2, no JVD, no peripheral edema  Resp: clear to auscultation bilaterally, normal RR and effort noted  GI: soft, mild epigastric tenderness, with active bowel sounds. No guarding or palpable organomegaly noted.  Skin; warm and dry, no rash or jaundice noted  Neuro: awake, alert and oriented x 3. Normal gross motor function and fluent speech  Labs:  CMP Latest Ref Rng & Units 12/29/2016 12/29/2016 11/25/2016  Glucose 65 - 99 mg/dL 123(H) 114(H) 111  BUN 6 - 20 mg/dL 11 13 15.3  Creatinine 0.44 - 1.00 mg/dL 1.06(H) 0.90 0.8  Sodium 135 - 145 mmol/L 137 139 138  Potassium 3.5 - 5.1 mmol/L 3.9 4.8 3.8  Chloride 101 - 111 mmol/L 103 103 -  CO2 22 - 32 mmol/L '29 29 25  '$ Calcium 8.9 - 10.3 mg/dL 8.8(L) 9.2 9.2  Total Protein 6.0 - 8.3 g/dL - 7.0 7.6  Total Bilirubin 0.2 - 1.2 mg/dL - 0.9 0.61  Alkaline Phos 39 - 117 U/L - 98 99  AST 0 - 37 U/L - 12 24  ALT 0 - 35 U/L - 16 30   CBC Latest Ref Rng & Units 12/29/2016 12/29/2016 11/25/2016  WBC 4.0 - 10.5 K/uL 10.3 10.6(H) 9.8  Hemoglobin 12.0 - 15.0 g/dL 12.9 13.8 15.2  Hematocrit 36.0 - 46.0 % 39.8 41.1 44.1  Platelets 150 - 400 K/uL 163 191.0 210   CMP Latest Ref Rng & Units 12/29/2016 12/29/2016 11/25/2016  Glucose 65 - 99 mg/dL 123(H) 114(H) 111  BUN 6 - 20 mg/dL 11 13 15.3  Creatinine 0.44 - 1.00 mg/dL 1.06(H) 0.90 0.8  Sodium 135 - 145 mmol/L 137 139 138  Potassium 3.5 - 5.1 mmol/L 3.9 4.8 3.8  Chloride 101 - 111 mmol/L 103 103 -  CO2 22 - 32 mmol/L '29 29 25  '$ Calcium 8.9 - 10.3 mg/dL 8.8(L) 9.2 9.2  Total Protein 6.0 - 8.3 g/dL - 7.0 7.6  Total Bilirubin 0.2 - 1.2 mg/dL - 0.9 0.61  Alkaline Phos 39 - 117 U/L - 98 99  AST 0 - 37 U/L - 12 24  ALT 0 - 35 U/L - 16 30     Radiologic Studies:  No recent imaging  Assessment: Encounter Diagnosis  Name Primary?  . Abdominal pain, chronic, epigastric Yes    The nature of this epigastric pain is unclear. It was of acute onset, but there was no  associated nausea and vomiting to indicate food poisoning or other infectious cause. She is artery had her gallbladder out and her LFTs are normal, speaking against common bile duct stone. It may be slowly improving, but difficult to tell based on her description. It may be a prolonged postinfectious dyspepsia in a patient with a chronic pain syndrome. Plan:  EGD  The benefits and risks of the planned procedure were described in detail with the patient or (when appropriate) their health care proxy.  Risks were outlined as including, but not limited to, bleeding, infection, perforation, adverse medication reaction leading to cardiac or pulmonary decompensation, or pancreatitis (if ERCP).  The limitation of incomplete mucosal visualization was also discussed.  No guarantees or warranties were given.  Discontinue Carafate  Thank you for the  courtesy of this consult.  Please call me with any questions or concerns.  Nelida Meuse III  CC: McGowen, Adrian Blackwater, MD

## 2017-02-02 ENCOUNTER — Encounter: Payer: Self-pay | Admitting: Gynecology

## 2017-02-02 ENCOUNTER — Ambulatory Visit (INDEPENDENT_AMBULATORY_CARE_PROVIDER_SITE_OTHER): Payer: Medicare Other | Admitting: Gynecology

## 2017-02-02 VITALS — BP 116/72 | Ht 66.0 in | Wt 176.0 lb

## 2017-02-02 DIAGNOSIS — Z01411 Encounter for gynecological examination (general) (routine) with abnormal findings: Secondary | ICD-10-CM

## 2017-02-02 DIAGNOSIS — M8580 Other specified disorders of bone density and structure, unspecified site: Secondary | ICD-10-CM

## 2017-02-02 DIAGNOSIS — N952 Postmenopausal atrophic vaginitis: Secondary | ICD-10-CM

## 2017-02-02 NOTE — Patient Instructions (Signed)
Follow up for the bone density as scheduled  Schedule your mammogram. 

## 2017-02-02 NOTE — Progress Notes (Signed)
    [name] March 18, 1946 [id number]        71 y.o.  G0P0 for breast and pelvic exam   Past medical history,surgical history, problem list, medications, allergies, family history and social history were all reviewed and documented as reviewed in the EPIC chart.  ROS:  Performed with pertinent positives and negatives included in the history, assessment and plan.   Additional significant findings :   none   Exam Caryn Bee assistant Vitals:   02/02/17 1036  BP: 116/72  Weight: 176 lb (79.8 kg)  Height: 5\' 6"  (1.676 m)   Body mass index is 28.41 kg/m.  General appearance:  Normal affect, orientation and appearance. Skin: Grossly normal HEENT: Without gross lesions.  No cervical or supraclavicular adenopathy. Thyroid normal.  Lungs:  Clear without wheezing, rales or rhonchi Cardiac: RR, without RMG Abdominal:  Soft, nontender, without masses, guarding, rebound, organomegaly or hernia Breasts:  Examined lying and sitting without masses, retractions, discharge or axillary adenopathy. Pelvic:  Ext, BUS, Vagina: With atrophic changes  Adnexa: Without masses or tenderness    Anus and perineum: Normal   Rectovaginal: Normal sphincter tone without palpated masses or tenderness.    Assessment/Plan:  71 y.o. G0P0 female for breast and pelvic exam.   1. Postmenopausal/atrophic genital changes. Status post TAH/BSO. Doing well without complaints of significant hot flushes, night sweats, vaginal dryness. 2. Osteopenia. DEXA 04/2014 T score -2 FRAX 11%/1.8%. Patient has DEXA scheduled over the next week or 2 and will follow up for this. 3. Mammography due now and I reminded the patient to schedule. SBE monthly reviewed. 4. Pap smear 2011. No Pap smear done today. No history of significant abnormal Pap smears. We both agree to stop screening based on age and hysterectomy history per current screening guidelines. 5. Colonoscopy due now and patient is going to arrange for this. 6. Health  maintenance. No routine lab work done as patient does this elsewhere. Follow up 1 year, sooner as needed.   Anastasio Auerbach MD, 11:07 AM 02/02/2017

## 2017-02-04 ENCOUNTER — Encounter: Payer: Medicare Other | Admitting: Gastroenterology

## 2017-02-09 DIAGNOSIS — M62830 Muscle spasm of back: Secondary | ICD-10-CM | POA: Diagnosis not present

## 2017-02-09 DIAGNOSIS — M961 Postlaminectomy syndrome, not elsewhere classified: Secondary | ICD-10-CM | POA: Diagnosis not present

## 2017-02-09 DIAGNOSIS — K5900 Constipation, unspecified: Secondary | ICD-10-CM | POA: Diagnosis not present

## 2017-02-09 DIAGNOSIS — G894 Chronic pain syndrome: Secondary | ICD-10-CM | POA: Diagnosis not present

## 2017-02-15 DIAGNOSIS — G4733 Obstructive sleep apnea (adult) (pediatric): Secondary | ICD-10-CM | POA: Diagnosis not present

## 2017-02-16 DIAGNOSIS — G4733 Obstructive sleep apnea (adult) (pediatric): Secondary | ICD-10-CM | POA: Diagnosis not present

## 2017-02-17 ENCOUNTER — Telehealth: Payer: Self-pay | Admitting: Internal Medicine

## 2017-02-17 NOTE — Telephone Encounter (Signed)
lmtcb for pt.  

## 2017-02-18 ENCOUNTER — Other Ambulatory Visit: Payer: Self-pay | Admitting: *Deleted

## 2017-02-18 DIAGNOSIS — G4733 Obstructive sleep apnea (adult) (pediatric): Secondary | ICD-10-CM

## 2017-02-18 NOTE — Telephone Encounter (Signed)
Called and spoke with pt and she stated that she had the sleep test on Tuesday night. She is aware of the process of reading the sleep studies and she is aware that we will call her with these results once CY and reviewed.  CY please advise once sleep study results are done.   Thanks

## 2017-02-18 NOTE — Telephone Encounter (Signed)
Patient returned call [phone]

## 2017-02-21 ENCOUNTER — Other Ambulatory Visit: Payer: Self-pay | Admitting: Internal Medicine

## 2017-02-21 DIAGNOSIS — G4733 Obstructive sleep apnea (adult) (pediatric): Secondary | ICD-10-CM

## 2017-02-21 NOTE — Progress Notes (Signed)
dme

## 2017-02-23 NOTE — Telephone Encounter (Signed)
CY please advise of the sleep study results once these are available. thanks

## 2017-02-24 ENCOUNTER — Encounter: Payer: Self-pay | Admitting: Family Medicine

## 2017-02-25 NOTE — Telephone Encounter (Signed)
CY please advise of these results. Thanks

## 2017-02-28 ENCOUNTER — Other Ambulatory Visit: Payer: Self-pay | Admitting: Gynecology

## 2017-02-28 DIAGNOSIS — Z1231 Encounter for screening mammogram for malignant neoplasm of breast: Secondary | ICD-10-CM

## 2017-02-28 NOTE — Telephone Encounter (Signed)
Notes recorded by Lorane Gell, CMA on 02/21/2017 at 1:15 PM EDT Pt is aware of results, order placed for CPAP set up and OV made for 05-26-17 at 2:00pm for 3 month follow up. ------  Notes recorded by Deneise Lever, MD on 02/18/2017 at 3:24 PM EDT Home Sleep Test- study did confirm she still has moderately severe obstructive sleep apnea, stopping 17 times per hour.  As we discussed at office visit, I recommend we retry CPAP.  Please order new DME, new CPAP, mask of choice, humidifier, supplies, [employer]  Dx OSA  Please make her a return ov with me in 3 moths for CPAP follow-up.

## 2017-02-28 NOTE — Telephone Encounter (Signed)
Waiting on results of PFT from CY.  Pt is aware.

## 2017-03-01 ENCOUNTER — Ambulatory Visit (AMBULATORY_SURGERY_CENTER): Payer: Medicare Other | Admitting: Gastroenterology

## 2017-03-01 ENCOUNTER — Encounter: Payer: Self-pay | Admitting: Gastroenterology

## 2017-03-01 VITALS — BP 149/70 | HR 53 | Temp 97.1°F | Resp 15 | Ht 66.5 in | Wt 177.0 lb

## 2017-03-01 DIAGNOSIS — R1013 Epigastric pain: Secondary | ICD-10-CM

## 2017-03-01 DIAGNOSIS — K2950 Unspecified chronic gastritis without bleeding: Secondary | ICD-10-CM

## 2017-03-01 DIAGNOSIS — K2960 Other gastritis without bleeding: Secondary | ICD-10-CM

## 2017-03-01 MED ORDER — SODIUM CHLORIDE 0.9 % IV SOLN: 500.0000 mL | INTRAVENOUS | Status: DC

## 2017-03-01 NOTE — Progress Notes (Signed)
Called to room to assist during endoscopic procedure.  Patient ID and intended procedure confirmed with present staff. Received instructions for my participation in the procedure from the performing physician.  

## 2017-03-01 NOTE — Progress Notes (Signed)
Pt's states no medical or surgical changes since previsit or office visit. 

## 2017-03-01 NOTE — Op Note (Signed)
[facility] Patient Name: [name] Procedure Date: 03/01/2017 9:33 AM MRN: [id number] Endoscopist: Mallie Mussel L. Loletha Carrow , MD Age: 71 Referring MD:  Date of Birth: 04-Jun-1946 Gender: Female Account #: [id number] Procedure:                Upper GI endoscopy Indications:              Epigastric abdominal pain, Abdominal pain in the                            right upper quadrant Medicines:                Monitored Anesthesia Care Procedure:                Pre-Anesthesia Assessment:                           - Prior to the procedure, a History and Physical                            was performed, and patient medications and                            allergies were reviewed. The patient's tolerance of                            previous anesthesia was also reviewed. The risks                            and benefits of the procedure and the sedation                            options and risks were discussed with the patient.                            All questions were answered, and informed consent                            was obtained. Prior Anticoagulants: The patient has                            taken no previous anticoagulant or antiplatelet                            agents. ASA Grade Assessment: III - A patient with                            severe systemic disease. After reviewing the risks                            and benefits, the patient was deemed in                            satisfactory condition to undergo the procedure.  After obtaining informed consent, the endoscope was                            passed under direct vision. Throughout the                            procedure, the patient's blood pressure, pulse, and                            oxygen saturations were monitored continuously. The                            Model GIF-[id number]) scope was introduced                            through the mouth, and advanced  to the second part                            of duodenum. The upper GI endoscopy was                            accomplished without difficulty. The patient                            tolerated the procedure well. Scope In: Scope Out: Findings:                 The larynx was normal.                           The esophagus was normal.                           A few sessile fundic gland polyps with no stigmata                            of recent bleeding were found in the gastric fundus.                           Multiple erosions were found in the gastric antrum.                            Four biopsies were obtained on the anterior wall of                            the gastric body, on the posterior wall of the                            gastric body, on the anterior wall of the gastric                            antrum and on the posterior wall of the gastric  antrum with cold forceps for histology.                           The cardia and gastric fundus were normal on                            retroflexion.                           The examined duodenum was normal. Complications:            No immediate complications. Estimated Blood Loss:     Estimated blood loss was minimal. Impression:               - Normal larynx.                           - Normal esophagus.                           - A few fundic gland polyps (common, benign                            finding).                           - Erosive gastropathy. Suspected to be from NSAID                            use and less likely H. pylori.                           - Normal examined duodenum.                           - Four biopsies were obtained on the anterior wall                            of the gastric body, on the posterior wall of the                            gastric body, on the anterior wall of the gastric                            antrum and on the posterior wall of the  gastric                            antrum.                           Symptoms are most consistent with functional                            abdominal pain and are expected to resolve. Recommendation:           - Patient has a contact number available for  emergencies. The signs and symptoms of potential                            delayed complications were discussed with the                            patient. Return to normal activities tomorrow.                            Written discharge instructions were provided to the                            patient.                           - Resume previous diet.                           - Continue present medications.                           - Await pathology results. Henry L. Loletha Carrow, MD 03/01/2017 9:56:59 AM This report has been signed electronically.

## 2017-03-01 NOTE — Progress Notes (Signed)
Alert and oriented x3, pleased with MAC, report to [place] advisory given to patient

## 2017-03-01 NOTE — Patient Instructions (Signed)
YOU HAD AN ENDOSCOPIC PROCEDURE TODAY AT THE [facility]:   Refer to the procedure report that was given to you for any specific questions about what was found during the examination.  If the procedure report does not answer your questions, please call your gastroenterologist to clarify.  If you requested that your care partner not be given the details of your procedure findings, then the procedure report has been included in a sealed envelope for you to review at your convenience later.  YOU SHOULD EXPECT: Some feelings of bloating in the abdomen. Passage of more gas than usual.  Walking can help get rid of the air that was put into your GI tract during the procedure and reduce the bloating. If you had a lower endoscopy (such as a colonoscopy or flexible sigmoidoscopy) you may notice spotting of blood in your stool or on the toilet paper. If you underwent a bowel prep for your procedure, you may not have a normal bowel movement for a few days.  Please Note:  You might notice some irritation and congestion in your nose or some drainage.  This is from the oxygen used during your procedure.  There is no need for concern and it should clear up in a day or so.  SYMPTOMS TO REPORT IMMEDIATELY:    Following upper endoscopy (EGD)  Vomiting of blood or coffee ground material  New chest pain or pain under the shoulder blades  Painful or persistently difficult swallowing  New shortness of breath  Fever of 100F or higher  Black, tarry-looking stools  For urgent or emergent issues, a gastroenterologist can be reached at any hour by calling [phone].   DIET:  We do recommend a small meal at first, but then you may proceed to your regular diet.  Drink plenty of fluids but you should avoid alcoholic beverages for 24 hours.  ACTIVITY:  You should plan to take it easy for the rest of today and you should NOT DRIVE or use heavy machinery until tomorrow (because of the sedation medicines used  during the test).    FOLLOW UP: Our staff will call the number listed on your records the next business day following your procedure to check on you and address any questions or concerns that you may have regarding the information given to you following your procedure. If we do not reach you, we will leave a message.  However, if you are feeling well and you are not experiencing any problems, there is no need to return our call.  We will assume that you have returned to your regular daily activities without incident.  If any biopsies were taken you will be contacted by phone or by letter within the next 1-3 weeks.  Please call us at [phone] if you have not heard about the biopsies in 3 weeks.    SIGNATURES/CONFIDENTIALITY: You and/or your care partner have signed paperwork which will be entered into your electronic medical record.  These signatures attest to the fact that that the information above on your After Visit Summary has been reviewed and is understood.  Full responsibility of the confidentiality of this discharge information lies with you and/or your care-partner.  Gastritis information given. 

## 2017-03-02 ENCOUNTER — Telehealth: Payer: Self-pay | Admitting: *Deleted

## 2017-03-02 NOTE — Telephone Encounter (Signed)
  Follow up Call-  Call back number 03/01/2017  Post procedure Call Back phone  # [phone]  Permission to leave phone message Yes  Some recent data might be hidden     Patient questions:  Do you have a fever, pain , or abdominal swelling? No. Pain Score  0 *  Have you tolerated food without any problems? Yes.    Have you been able to return to your normal activities? Yes.    Do you have any questions about your discharge instructions: Diet   No. Medications  No. Follow up visit  No.  Do you have questions or concerns about your Care? No.  Actions: * If pain score is 4 or above: No action needed, pain <4.

## 2017-03-07 DIAGNOSIS — M7061 Trochanteric bursitis, right hip: Secondary | ICD-10-CM | POA: Diagnosis not present

## 2017-03-08 ENCOUNTER — Telehealth: Payer: Self-pay | Admitting: Internal Medicine

## 2017-03-08 ENCOUNTER — Encounter: Payer: Self-pay | Admitting: Gastroenterology

## 2017-03-08 ENCOUNTER — Other Ambulatory Visit (HOSPITAL_BASED_OUTPATIENT_CLINIC_OR_DEPARTMENT_OTHER): Payer: Medicare Other

## 2017-03-08 ENCOUNTER — Ambulatory Visit (HOSPITAL_BASED_OUTPATIENT_CLINIC_OR_DEPARTMENT_OTHER)
Admission: RE | Admit: 2017-03-08 | Discharge: 2017-03-08 | Disposition: A | Discharge status: Home / Self Care | Payer: Medicare Other | Source: Ambulatory Visit | Attending: Gynecology | Admitting: Gynecology

## 2017-03-08 ENCOUNTER — Encounter (HOSPITAL_BASED_OUTPATIENT_CLINIC_OR_DEPARTMENT_OTHER): Payer: Self-pay

## 2017-03-08 DIAGNOSIS — Z1231 Encounter for screening mammogram for malignant neoplasm of breast: Secondary | ICD-10-CM | POA: Diagnosis not present

## 2017-03-08 NOTE — Telephone Encounter (Signed)
CY  Please Advise-  This pt had to reschedule her appt that she had with you on 9/6 due to the fact that she will be out of town. Pt started her cpap on 6/7 and she needs a follow up appt within 90 days. Your next first available appt is not until 9/26. Please advise where we could fit this pt in or are you ok with pt seeing an NP.

## 2017-03-08 NOTE — Telephone Encounter (Signed)
Pt has been scheduled with TP on 04/19/17 @ 11:15a. Nothing further needed.

## 2017-03-08 NOTE — Telephone Encounter (Signed)
Okay to set her up with an NP for CPAP follow-up visit

## 2017-03-22 ENCOUNTER — Telehealth: Payer: Self-pay | Admitting: *Deleted

## 2017-03-22 ENCOUNTER — Telehealth: Payer: Self-pay | Admitting: Internal Medicine

## 2017-03-22 DIAGNOSIS — G4733 Obstructive sleep apnea (adult) (pediatric): Secondary | ICD-10-CM

## 2017-03-22 NOTE — Telephone Encounter (Signed)
[name] is still down at this time.

## 2017-03-22 NOTE — Telephone Encounter (Signed)
Spoke with pt, requesting we place an order to [facility] to lower her cpap pressure.  Per pt, the pressure is too high and she is unable to get restful sleep with the pressure. Per last order, her pressure is set at auto 5-20cm. Attempted to obtain cpap download off of [employer], but website is currently down. Called [facility] to have download faxed over, the site is down on their end as well.  Per RT, they received an email stating that it can take anywhere from 2-6 hours for the site to be back up and running.   Pt aware that the site is down, and that we will need this DL in order to change pressures.  Will hold message in triage to continue following up on [employer].

## 2017-03-22 NOTE — Telephone Encounter (Signed)
Patient has been experiencing bone pain in her legs. In addition, she has noticed more bruising without any known injury. She is wondering if these symptoms could be related to her MGUS diagnosis.  Spoke to Dr Marin Olp and he would like [name] to see her next week for assessment. Message sent to scheduler and patient aware.

## 2017-03-23 ENCOUNTER — Encounter: Payer: Self-pay | Admitting: Internal Medicine

## 2017-03-24 ENCOUNTER — Other Ambulatory Visit: Payer: Self-pay | Admitting: *Deleted

## 2017-03-24 NOTE — Telephone Encounter (Signed)
Please order [facility]ME [facility]-   Reduce CPAP auto range to 5-15    Dx OSA

## 2017-03-24 NOTE — Telephone Encounter (Signed)
Spoke with pt. She is aware that CY wants to change her pressure. Order has been placed. Nothing further was needed.

## 2017-03-24 NOTE — Telephone Encounter (Signed)
DL has been printed out and placed on CY cart to review.  CY please advise. Thanks

## 2017-03-24 NOTE — Telephone Encounter (Signed)
Patient is calling to follow up on cpap pressure.

## 2017-03-25 ENCOUNTER — Ambulatory Visit (HOSPITAL_BASED_OUTPATIENT_CLINIC_OR_DEPARTMENT_OTHER): Payer: Medicare Other | Admitting: Family

## 2017-03-25 ENCOUNTER — Other Ambulatory Visit (HOSPITAL_BASED_OUTPATIENT_CLINIC_OR_DEPARTMENT_OTHER): Payer: Medicare Other

## 2017-03-25 VITALS — BP 147/62 | HR 53 | Temp 98.3°F | Resp 16 | Wt 169.0 lb

## 2017-03-25 DIAGNOSIS — D472 Monoclonal gammopathy: Secondary | ICD-10-CM

## 2017-03-25 DIAGNOSIS — D508 Other iron deficiency anemias: Secondary | ICD-10-CM | POA: Diagnosis not present

## 2017-03-25 DIAGNOSIS — D509 Iron deficiency anemia, unspecified: Secondary | ICD-10-CM

## 2017-03-25 LAB — CBC WITH DIFFERENTIAL (CANCER CENTER ONLY)
BASO#: 0 10*3/uL (ref 0.0–0.2)
BASO%: 0.4 % (ref 0.0–2.0)
EOS%: 3.4 % (ref 0.0–7.0)
Eosinophils Absolute: 0.3 10*3/uL (ref 0.0–0.5)
HCT: 40.6 % (ref 34.8–46.6)
HGB: 13.5 g/dL (ref 11.6–15.9)
LYMPH#: 2.2 10*3/uL (ref 0.9–3.3)
LYMPH%: 28.4 % (ref 14.0–48.0)
MCH: 29.3 pg (ref 26.0–34.0)
MCHC: 33.3 g/dL (ref 32.0–36.0)
MCV: 88 fL (ref 81–101)
MONO#: 0.5 10*3/uL (ref 0.1–0.9)
MONO%: 5.8 % (ref 0.0–13.0)
NEUT#: 4.8 10*3/uL (ref 1.5–6.5)
NEUT%: 62 % (ref 39.6–80.0)
PLATELETS: 184 10*3/uL (ref 145–400)
RBC: 4.61 10*6/uL (ref 3.70–5.32)
RDW: 12.7 % (ref 11.1–15.7)
WBC: 7.7 10*3/uL (ref 3.9–10.0)

## 2017-03-25 LAB — COMPREHENSIVE METABOLIC PANEL (CC13)
ALT: 15 IU/L (ref 0–32)
AST (SGOT): 13 IU/L (ref 0–40)
Albumin, Serum: 4 g/dL (ref 3.5–4.8)
Albumin/Globulin Ratio: 1.3 (ref 1.2–2.2)
Alkaline Phosphatase, S: 111 IU/L (ref 39–117)
BUN/Creatinine Ratio: 16 (ref 12–28)
BUN: 12 mg/dL (ref 8–27)
Bilirubin Total: 0.4 mg/dL (ref 0.0–1.2)
CALCIUM: 9.5 mg/dL (ref 8.7–10.3)
CO2: 24 mmol/L (ref 20–29)
CREATININE: 0.76 mg/dL (ref 0.57–1.00)
Chloride, Ser: 104 mmol/L (ref 96–106)
GFR calc Af Amer: 92 mL/min/{1.73_m2} (ref >59)
GFR, EST NON AFRICAN AMERICAN: 80 mL/min/{1.73_m2} (ref >59)
Globulin, Total: 3.1 g/dL (ref 1.5–4.5)
Glucose: 104 mg/dL — ABNORMAL HIGH (ref 65–99)
Potassium, Ser: 4.3 mmol/L (ref 3.5–5.2)
Sodium: 138 mmol/L (ref 134–144)
Total Protein: 7.1 g/dL (ref 6.0–8.5)

## 2017-03-25 NOTE — Progress Notes (Signed)
Hematology and Oncology Follow Up Visit  [name] [id number] [birth year] 71 y.o. 03/25/2017   Principle Diagnosis:  1. IgM kappa monoclonal gammopathy of undetermined significance 2. Intermittent iron deficiency anemia  Current Therapy:   IV iron as indicated - last received in April 2017   Interim History:  [name] is here today with her husband for follow-up. She is doing well but still having pain in the left knee. She states that she is seeing her orthopedist and receiving knee injections as needed. She has had no numbness or tingling in her extremities.  Her CBC today looks good. M-spike at her last visit was down to 0.4. Protein studies today are pending.  She bruises occasionally, not in excess. No episodes of bleeding, no petechiae.  No fever, chills, n/v, cough, rash, SOB, chest pain or changes in bowel or bladder habits.  She had a sleep study done and is now using a CPAP. She is still trying to get used to it.  She takes her metoprolol daily and has had no recent episodes of palpitations.  She has vertigo and will have occasional episodes of dizziness. She takes Meclizine as needed.  She went to the ED with abdominal pain last month. Endoscopy showed errosive gastropathy felt to be from NSAIDS. Biopsies were benign.  Her mammogram in June was negative.  Her appetite comes and goes. She is staying hydrated. She stopped drinking pop and has lost 19 lbs since we last saw her in March.   ECOG Performance Status: 1 - Symptomatic but completely ambulatory  Medications:  Allergies as of 03/25/2017      Reactions   Cymbalta [duloxetine Hcl]    Facial edema   Iohexol     Code: HIVES, Desc: pt states her face and throat swells when administered IV contrast - KB, Onset Date: [id number]   Mepergan [meperidine-promethazine]    Cardiac arrest   Morphine Rash   Augmentin [amoxicillin-pot Clavulanate] Nausea Only   Citalopram Other (See Comments)   Jittery/heart racing   Hctz  [hydrochlorothiazide] Other (See Comments)   Too much urinating, felt dried out, etc--NO ALLERGY   Lexapro [escitalopram Oxalate] Other (See Comments)   Jittery,heart racing   Lyrica [pregabalin] Swelling   Face, hands, fingers, ankles   Valsartan    REACTION: pt states INTOL to Diovan      Medication List       Accurate as of 03/25/17  2:02 PM. Always use your most recent med list.          acetaminophen 325 MG tablet Commonly known as:  TYLENOL Take 650 mg by mouth every 6 (six) hours as needed.   ALIGN 4 MG Caps Take 1 capsule (4 mg total) by mouth daily.   ALPRAZolam 0.5 MG tablet Commonly known as:  XANAX TAKE ONE-HALF TO ONE TABLET BY MOUTH THREE TIMES DAILY   Azelastine HCl 0.15 % Soln Commonly known as:  ASTEPRO 2 sprays each nostril every 12 hours   calcium carbonate 1500 (600 Ca) MG Tabs tablet Commonly known as:  OSCAL Take by mouth daily.   cyclobenzaprine 5 MG tablet Commonly known as:  FLEXERIL   dicyclomine 20 MG tablet Commonly known as:  BENTYL Take 1 tablet (20 mg total) by mouth 2 (two) times daily.   diphenhydrAMINE 25 MG tablet Commonly known as:  BENADRYL Take 25 mg by mouth at bedtime as needed.   fish oil-omega-3 fatty acids 1000 MG capsule Take 2 g by mouth daily.  hydrocortisone 2.5 % cream Apply 1 application topically as needed. RECTAL ITCHING   Inositol Niacinate 500 MG Caps Take 1 capsule by mouth at bedtime.   levothyroxine 75 MCG tablet Commonly known as:  SYNTHROID, LEVOTHROID TAKE ONE-HALF TABLET BY MOUTH ONCE DAILY   magnesium (amino acid chelate) 133 MG tablet Take 1 tablet by mouth daily.   meclizine 25 MG tablet Commonly known as:  ANTIVERT Take 1 tablet (25 mg total) by mouth every 6 (six) hours as needed for dizziness.   MELATONIN PO Take by mouth.   meloxicam 7.5 MG tablet Commonly known as:  MOBIC TAKE TWO TABLETS BY MOUTH ONCE DAILY   metoprolol tartrate 25 MG tablet Commonly known as:   LOPRESSOR TAKE ONE TABLET BY MOUTH ONCE DAILY   MULTIVITAMIN/IRON PO Take 1 tablet by mouth daily.   omeprazole 40 MG capsule Commonly known as:  PRILOSEC Take 1 capsule (40 mg total) by mouth daily.   Oxycodone HCl 10 MG Tabs 10 mg every 4 (four) hours as needed. 1 every 6 hours for pain   promethazine 25 MG tablet Commonly known as:  PHENERGAN TAKE ONE TABLET BY MOUTH EVERY 6 HOURS AS NEEDED FOR NAUSEA AND VOMITING   simvastatin 40 MG tablet Commonly known as:  ZOCOR Take 1 tablet (40 mg total) by mouth at bedtime.   Vitamin D 2000 units Caps Take 5 capsules by mouth daily.       Allergies:  Allergies  Allergen Reactions  . Cymbalta [Duloxetine Hcl]     Facial edema  . Iohexol      Code: HIVES, Desc: pt states her face and throat swells when administered IV contrast - KB, Onset Date: 10272536   . Mepergan [Meperidine-Promethazine]     Cardiac arrest  . Morphine Rash  . Augmentin [Amoxicillin-Pot Clavulanate] Nausea Only  . Citalopram Other (See Comments)    Jittery/heart racing   . Hctz [Hydrochlorothiazide] Other (See Comments)    Too much urinating, felt dried out, etc--NO ALLERGY  . Lexapro [Escitalopram Oxalate] Other (See Comments)    Jittery,heart racing  . Lyrica [Pregabalin] Swelling    Face, hands, fingers, ankles  . Valsartan     REACTION: pt states INTOL to Diovan    Past Medical History, Surgical history, Social history, and Family History were reviewed and updated.  Review of Systems: All other 10 point review of systems is negative.   Physical Exam:  weight is 169 lb (76.7 kg). Her oral temperature is 98.3 F (36.8 C). Her blood pressure is 147/62 (abnormal) and her pulse is 53 (abnormal). Her respiration is 16 and oxygen saturation is 93%.   Wt Readings from Last 3 Encounters:  03/25/17 169 lb (76.7 kg)  03/01/17 177 lb (80.3 kg)  02/02/17 176 lb (79.8 kg)    Ocular: Sclerae unicteric, pupils equal, round and reactive to  light Ear-nose-throat: Oropharynx clear, dentition fair Lymphatic: No cervical, supraclavicular or axillary adenopathy Lungs no rales or rhonchi, good excursion bilaterally Heart regular rate and rhythm, no murmur appreciated Abd soft, nontender, positive bowel sounds, no liver or spleen tip palpated on exam, no fluid wave MSK no focal spinal tenderness, no joint edema Neuro: non-focal, well-oriented, appropriate affect Breasts: Deferred   Lab Results  Component Value Date   WBC 7.7 03/25/2017   HGB 13.5 03/25/2017   HCT 40.6 03/25/2017   MCV 88 03/25/2017   PLT 184 03/25/2017   Lab Results  Component Value Date   FERRITIN 176 11/25/2016  IRON 67 11/25/2016   TIBC 295 11/25/2016   UIBC 228 11/25/2016   IRONPCTSAT 23 11/25/2016   Lab Results  Component Value Date   RETICCTPCT 1.2 01/20/2011   RBC 4.61 03/25/2017   RETICCTABS 52.2 01/20/2011   Lab Results  Component Value Date   KPAFRELGTCHN 2.17 (H) 06/19/2015   LAMBDASER 1.94 06/19/2015   KAPLAMBRATIO 0.97 11/25/2016   Lab Results  Component Value Date   IGGSERUM 597 (L) 11/25/2016   IGA 262 06/19/2015   IGMSERUM 629 (H) 11/25/2016   Lab Results  Component Value Date   TOTALPROTELP 6.9 06/19/2015   ALBUMINELP 3.9 06/19/2015   A1GS 0.3 06/19/2015   A2GS 0.9 06/19/2015   BETS 0.4 06/19/2015   BETA2SER 0.5 06/19/2015   GAMS 0.9 06/19/2015   MSPIKE 0.4 (H) 12/17/2015   SPEI * 06/19/2015     Chemistry      Component Value Date/Time   NA 137 12/29/2016 2330   NA 138 11/25/2016 1147   K 3.9 12/29/2016 2330   K 3.8 11/25/2016 1147   CL 103 12/29/2016 2330   CL 103 01/15/2010 0835   CO2 29 12/29/2016 2330   CO2 25 11/25/2016 1147   BUN 11 12/29/2016 2330   BUN 15.3 11/25/2016 1147   CREATININE 1.06 (H) 12/29/2016 2330   CREATININE 0.8 11/25/2016 1147      Component Value Date/Time   CALCIUM 8.8 (L) 12/29/2016 2330   CALCIUM 9.2 11/25/2016 1147   ALKPHOS 98 12/29/2016 1501   ALKPHOS 99 11/25/2016  1147   AST 12 12/29/2016 1501   AST 24 11/25/2016 1147   ALT 16 12/29/2016 1501   ALT 30 11/25/2016 1147   BILITOT 0.9 12/29/2016 1501   BILITOT 0.61 11/25/2016 1147      Impression and Plan: Ms. Goeden is a very pleasant 71 yo caucasian female with MGUS and history of iron deficiency. Her CBC today is good. M-spike has remained stable. Protein studies for today are pending.  She has had no new issues since we last saw her. We will see what her iron studies show and bring her back in next week for an infusion if needed.  We will go ahead and plan to see her back again in 6 months for repeat lab work and follow-up.  Greater than 50% of her 15 minute face to face visit was spent counseling and coordinating care.  She will contact our office with any questions or concerns. We can certainly see her sooner if need be.    Eliezer Bottom, NP 7/6/20182:02 PM

## 2017-03-26 LAB — IGG, IGA, IGM
IGM (IMMUNOGLOBIN M), SRM: 576 mg/dL — AB (ref 26–217)
IgA, Qn, Serum: 259 mg/dL (ref 87–352)
IgG, Qn, Serum: 613 mg/dL — ABNORMAL LOW (ref 700–1600)

## 2017-03-28 LAB — KAPPA/LAMBDA LIGHT CHAINS
IG KAPPA FREE LIGHT CHAIN: 13.8 mg/L (ref 3.3–19.4)
Ig Lambda Free Light Chain: 14 mg/L (ref 5.7–26.3)
Kappa/Lambda FluidC Ratio: 0.99 (ref 0.26–1.65)

## 2017-03-28 LAB — IRON AND TIBC
%SAT: 27 % (ref 21–57)
IRON: 67 ug/dL (ref 41–142)
TIBC: 253 ug/dL (ref 236–444)
UIBC: 185 ug/dL (ref 120–384)

## 2017-03-28 LAB — FERRITIN: Ferritin: 230 ng/ml (ref 9–269)

## 2017-03-30 ENCOUNTER — Encounter: Payer: Self-pay | Admitting: Family Medicine

## 2017-03-30 LAB — PROTEIN ELECTROPHORESIS, SERUM, WITH REFLEX
A/G RATIO SPE: 1.2 (ref 0.7–1.7)
Albumin: 3.6 g/dL (ref 2.9–4.4)
Alpha 1: 0.2 g/dL (ref 0.0–0.4)
Alpha 2: 0.8 g/dL (ref 0.4–1.0)
Beta: 1 g/dL (ref 0.7–1.3)
GAMMA GLOBULIN: 1.1 g/dL (ref 0.4–1.8)
Globulin, Total: 3.1 g/dL (ref 2.2–3.9)
Interpretation(See Below): 0
M-SPIKE, %: 0.5 g/dL — AB
TOTAL PROTEIN: 6.7 g/dL (ref 6.0–8.5)

## 2017-03-31 ENCOUNTER — Telehealth: Payer: Self-pay | Admitting: Internal Medicine

## 2017-03-31 DIAGNOSIS — G4733 Obstructive sleep apnea (adult) (pediatric): Secondary | ICD-10-CM

## 2017-03-31 NOTE — Telephone Encounter (Signed)
Pt uses full face mask on CPAP but states she recently got chin strap as well; pt is concerned with putting pressure up to auto 5-20 as last time she had issues with increase in gas. Pt would like to know from CY what else can be done to help.

## 2017-04-01 NOTE — Telephone Encounter (Signed)
Let's try compromise- see if she can tolerate auto 5-18        Dx OSA

## 2017-04-01 NOTE — Telephone Encounter (Signed)
LM x 1 

## 2017-04-01 NOTE — Telephone Encounter (Signed)
Patient returning call - she can be reached at [phone] -pr

## 2017-04-01 NOTE — Telephone Encounter (Addendum)
Pt is aware of CY's recommendations and voiced her understanding. Pt states she is willing to try auto 5-18, but will contact our office if she develops gas which this pressure. Order has been placed to [facility] for pressure change.  Nothing further needed.

## 2017-04-06 ENCOUNTER — Telehealth: Payer: Self-pay | Admitting: Internal Medicine

## 2017-04-06 DIAGNOSIS — G4731 Primary central sleep apnea: Secondary | ICD-10-CM

## 2017-04-06 NOTE — Telephone Encounter (Signed)
Katie- please get her in with me instead of TP in about a month

## 2017-04-06 NOTE — Telephone Encounter (Signed)
I have addended my last office note to cover need to try change from CPAP to ASV. Please order [facility]- please change CPAP to Auto ASV EPAP min 5, max 11 cwp; PS min 4, max 12, Breath Rate Auto BrPM.     Dx Complex Sleep Apnea We will need download in 1 month to determine efficacy of ASV.

## 2017-04-06 NOTE — Telephone Encounter (Signed)
CY  Please Advise-  Spoke with patient and she stated that she is not tolerating her pressure on 5-18cm. Pt states last night she was only able to sleep with her cpap for 2 hours due to the pressure causing her too much gas and discomfort in her abdomen. She is unsure of what can be done next.

## 2017-04-06 NOTE — Telephone Encounter (Signed)
CY  Please Advise-  Spoke with pt and informed her of your recommendations. She agreed to the order being placed for ASV. She had a f/u appt with TP on 04/19/17 but we wanted to find out from you when we should push it back since she is being switched.

## 2017-04-07 DIAGNOSIS — M961 Postlaminectomy syndrome, not elsewhere classified: Secondary | ICD-10-CM | POA: Diagnosis not present

## 2017-04-07 DIAGNOSIS — M62830 Muscle spasm of back: Secondary | ICD-10-CM | POA: Diagnosis not present

## 2017-04-07 DIAGNOSIS — K5900 Constipation, unspecified: Secondary | ICD-10-CM | POA: Diagnosis not present

## 2017-04-07 DIAGNOSIS — G894 Chronic pain syndrome: Secondary | ICD-10-CM | POA: Diagnosis not present

## 2017-04-07 NOTE — Telephone Encounter (Signed)
Spoke with pt and she is aware of CY changing her appt and moving it out. She agreed to the new appt which was made and her appt with TP has been canceled. She had no additional questions at this time. Nothing further is needed

## 2017-04-07 NOTE — Telephone Encounter (Signed)
Monday 05/09/17 at 11:30am can be used. Thanks.

## 2017-04-07 NOTE — Telephone Encounter (Signed)
Katie please see CY's message

## 2017-04-08 ENCOUNTER — Telehealth: Payer: Self-pay | Admitting: Internal Medicine

## 2017-04-08 DIAGNOSIS — G4733 Obstructive sleep apnea (adult) (pediatric): Secondary | ICD-10-CM

## 2017-04-08 NOTE — Telephone Encounter (Signed)
Called and spoke with pt and she is aware of CY recs.  She will call back on Monday to update.

## 2017-04-08 NOTE — Telephone Encounter (Signed)
Called and spoke with pt and she stated that she was started on the cpap on 20.  She stated that they did change the pressure to 15 due to the abd pain and bloating that she was having.  She stated that they had to increase her to 18 due to too many events on 15.   She stated that the gastritis is horrible with lots of abd pain, she stated that she has now started with diarrhea and for the last 2 nights she has not used the machine.    She was advised by her DME that CY was going to change her machine, but they told her that medicare will not cover this.  Pt wants to be compliant with the machine, but says that her stomach is really not tolerating this.  CY please advise. Thanks

## 2017-04-08 NOTE — Telephone Encounter (Signed)
Called and spoke with Barbaraann Rondo at [facility]---  He stated that with the new order---her insurance will not cover this change without a cpap titration and an OV to document this information.  Will forward to CY to make him aware.

## 2017-04-08 NOTE — Telephone Encounter (Signed)
I suggest she stop CPAP over the weekend to let her stomach settle down and see how she feels Monday. I need to hear from her DME what specifically her insurance is saying.

## 2017-04-11 ENCOUNTER — Other Ambulatory Visit: Payer: Self-pay | Admitting: Family Medicine

## 2017-04-12 ENCOUNTER — Telehealth: Payer: Self-pay | Admitting: Internal Medicine

## 2017-04-12 DIAGNOSIS — G4733 Obstructive sleep apnea (adult) (pediatric): Secondary | ICD-10-CM

## 2017-04-12 NOTE — Telephone Encounter (Signed)
Her D[facility] has told us that insurance won't cover the kind of pressure set-up I was thinking of unless we document a CPAP titration study first, then an office visit.  Please order CPAP titration sleep study for dx OSA  Please let me know what date this is scheduled for, so that Katie and I can find a work-in ov spot when that result is available.  Please ask how she is sleeping and how her stomach is feeling, now that she is not using her BIPAP.

## 2017-04-12 NOTE — Telephone Encounter (Signed)
Patient called to clinic to follow up on message that was left, pt contact # [phone].[name]

## 2017-04-12 NOTE — Telephone Encounter (Signed)
Pt aware that this has been sent to Dr Annamaria Boots to advise on next steps.  Pt requests that we leave a detailed message on her voicemail if she does not answer  Please advise Dr Annamaria Boots. Thanks.

## 2017-04-12 NOTE — Telephone Encounter (Signed)
[facility] unable to get through on number listed.  WCB

## 2017-04-12 NOTE — Telephone Encounter (Signed)
Spoke with patient. She wants to proceed with CPAP titration. Will place order. Pt is aware.

## 2017-04-13 NOTE — Telephone Encounter (Signed)
Called and spoke to pt. Informed her of the recs per CY. Order placed. Pt verbalized understanding and denied any further questions or concerns at this time.   

## 2017-04-13 NOTE — Telephone Encounter (Signed)
Spoke with the pt  She states wanting to try her CPAP again on 15  She has not been using machine at all per CDY's recs to help with gastritis  She states her stomach symptoms are improving  She fears ins will stop paying for CPAP due to non compliance She feels like she did the best on CPAP 15 and wonders if she can try this  Note that she is having another sleep study in Sept 2018  Please advise, thanks!

## 2017-04-13 NOTE — Telephone Encounter (Signed)
Ok to ask DME to change CPAP to fixed 15 at patient request    Dx OSA

## 2017-04-14 ENCOUNTER — Telehealth: Payer: Self-pay | Admitting: Internal Medicine

## 2017-04-14 NOTE — Telephone Encounter (Addendum)
CY- FYI  Pt called in and stated although she is aware that an order was placed yesterday for her pressure to be changed to 15 before she went to sleep it was still 5-18 and she was experiencing gastritis,abdominal pain and lots of gas. I called [facility] and spoke with respiratory therapist Ailene Ravel who took a verbal of the pressure change. Pt just wanted to make you aware of what she experienced last night   FYI to nurse: [facility] is reaching out to the patient today

## 2017-04-14 NOTE — Telephone Encounter (Signed)
Noted  

## 2017-04-19 ENCOUNTER — Ambulatory Visit: Payer: Medicare Other | Admitting: Adult Health

## 2017-04-25 ENCOUNTER — Encounter: Payer: Self-pay | Admitting: Family Medicine

## 2017-04-25 ENCOUNTER — Ambulatory Visit (INDEPENDENT_AMBULATORY_CARE_PROVIDER_SITE_OTHER): Payer: Medicare Other | Admitting: Family Medicine

## 2017-04-25 VITALS — BP 135/81 | HR 61 | Temp 98.5°F | Resp 16 | Ht 66.0 in | Wt 176.0 lb

## 2017-04-25 DIAGNOSIS — R1031 Right lower quadrant pain: Secondary | ICD-10-CM

## 2017-04-25 DIAGNOSIS — K5909 Other constipation: Secondary | ICD-10-CM

## 2017-04-25 DIAGNOSIS — E559 Vitamin D deficiency, unspecified: Secondary | ICD-10-CM | POA: Diagnosis not present

## 2017-04-25 DIAGNOSIS — R5382 Chronic fatigue, unspecified: Secondary | ICD-10-CM

## 2017-04-25 NOTE — Progress Notes (Signed)
OFFICE VISIT  04/25/2017   CC:  Chief Complaint  Patient presents with  . Side Pain    right side   HPI:    Patient is a 71 y.o. Caucasian female who presents for right side pain. Last few weeks feels a pain in RLQ region, comes and goes, sometimes more intense than others, typically lasts 30-40 min.  Lots of constipation lately, sometimes lots of straining, BM size sometimes large, sometimes small.  She started taking miralax and then dulcolax.   No blood or pus/mucous in BMs.  No fevers.  Appetite is good.  No n/v.  No radiation of the pain into flank or groin.  No hematuria or dysuria.  Has had some extra gas and "gastritis" due to being on CPAP lately, caused excessive upper and lower GI gas.  Her pressure was decreased and these sx's improved but her OSA got worse.  Her pulm went back up on her CPAP pressure. Says gastritis sx's/gassiness is a little better now.   Also, pt has chronic fatigue, asks for vit B12 and magnesium level check. Also, has hx of vit D def, asks for vit D level check.  CT abd/pelv w/out contrast NORMAL 12/2016.  Past Medical History:  Diagnosis Date  . Allergic rhinitis   . Anxiety   . Chest pain    cr  . Chronic gastritis    dx'd by EGD 2018 (erosive, likely NSAID-induced).  . Chronic pain syndrome    right knee osteo, hx of TKA in R knee.  Pt was told by [name] that no further surgical options are available for her--[name]  . Chronic renal insufficiency, stage II (mild) 2015   CrCl in th 60s.  . Diverticulosis of colon    with hx of 'itis  . DJD (degenerative joint disease)    L spine and knees  . Epigastric pain    As of 01/2017, GI (Dr. Loletha Carrow) planned EGD.  [name] FATIGUE / MALAISE 07/01/2009   Chronic  . Fibromyalgia   . GERD (gastroesophageal reflux disease)   . History of colonic polyps   . HTN (hypertension)   . Hypercholesterolemia   . Hypothyroid 07/05/2012  . IBS (irritable bowel syndrome)   . IC (interstitial  cystitis)    [name] at [place]  . Iron deficiency anemia, unspecified 06/27/2013   Has required IV iron on multiple occasions.  Hemoccults neg 12/2016.  . Lumbar back pain   . Major depression, recurrent ([place])    vs dysthymia,chronic  . Memory impairment 10/03/2012   Memory impairment 10/03/2012 >referral to neuro    . Monoclonal gammopathy of undetermined significance    IgM kappa MGUS ([name]) routine f/u has shown stability of this problem.  . OSA (obstructive sleep apnea)    As of 01/2017 pulm re-eval, plan is for home sleep study and likely restart of CPAP.  [name] Osteopenia 04/2014   T score -2.0 FRAX 11%/1.8%  . Otitis externa, candida 33/0076   [name] tx'd her with clotrimazole 1% external solution x 1 mo  . Palpitations   . Psoriasis   . Vitamin D deficiency   . Xerostomia    and xerophthalmia--per [name] (he suggests SSA, SSB, ESR, rh factor, ANA, and antimicrosmal and antithyroglobulin ab's as of 02/17/16)    Past Surgical History:  Procedure Laterality Date  . ABDOMINAL HYSTERECTOMY     for prolapse with abdominal incision  . BACK SURGERY  [id number]   L2-3 fusion (  [name])  . CARDIAC CATHETERIZATION     no PCI  . CHOLECYSTECTOMY    . COLONOSCOPY  04/2006   Diverticulosis, o/w normal.  . ESOPHAGOGASTRODUODENOSCOPY  05/29/2009; 03/01/17   Esoph normal.  Gastritis (H pylori NEG) + gastric polyps (benign).  Duodenal biopsy NORMAL.  2018-mild chronic gastritis, H pylori NEG.  . EYE SURGERY Left    "lazy eye"  . HAND SURGERY Right    TRIGGER FINGER; index finger  . HERNIA REPAIR     umbilical  . KNEE ARTHROSCOPY     RIGHT KNEE X 5  . LAMINOTOMY  2010   L-4, L-5 FUSION  . NASAL SINUS SURGERY     polypectomy (remote past)  . OOPHORECTOMY  2010   Laparoscopic BSO (path benign)  . PATELLA FRACTURE SURGERY  10-08   [name]  . POSTERIOR LAMINECTOMY / DECOMPRESSION LUMBAR SPINE  9-09   [name]  . TOTAL KNEE ARTHROPLASTY  12-07 and 4-07   [name],  revision 12-09 [name] at [facility]    Outpatient Medications Prior to Visit  Medication Sig Dispense Refill  . acetaminophen (TYLENOL) 325 MG tablet Take 650 mg by mouth every 6 (six) hours as needed.    . ALPRAZolam (XANAX) 0.5 MG tablet TAKE ONE-HALF TO ONE TABLET BY MOUTH THREE TIMES DAILY 270 tablet 1  . Azelastine HCl (ASTEPRO) 0.15 % SOLN 2 sprays each nostril every 12 hours (Patient taking differently: Place 2 sprays into the nose 2 (two) times daily as needed (Allergies). 2 sprays each nostril every 12 hours) 30 mL 11  . calcium carbonate (OSCAL) 1500 (600 Ca) MG TABS tablet Take by mouth daily.    . Cholecalciferol (VITAMIN D) 2000 UNITS CAPS Take 5 capsules by mouth daily.     . cyclobenzaprine (FLEXERIL) 5 MG tablet     . dicyclomine (BENTYL) 20 MG tablet Take 1 tablet (20 mg total) by mouth 2 (two) times daily. 60 tablet 3  . diphenhydrAMINE (BENADRYL) 25 MG tablet Take 25 mg by mouth at bedtime as needed.    . fish oil-omega-3 fatty acids 1000 MG capsule Take 2 g by mouth daily.     . hydrocortisone 2.5 % cream Apply 1 application topically as needed. RECTAL ITCHING    . Inositol Niacinate 500 MG CAPS Take 1 capsule by mouth at bedtime.    [name] levothyroxine (SYNTHROID, LEVOTHROID) 75 MCG tablet TAKE 1/2 (ONE-HALF) TABLET BY MOUTH ONCE DAILY 45 tablet 0  . meclizine (ANTIVERT) 25 MG tablet Take 1 tablet (25 mg total) by mouth every 6 (six) hours as needed for dizziness. 360 tablet 3  . MELATONIN PO Take by mouth.    . meloxicam (MOBIC) 7.5 MG tablet TAKE TWO TABLETS BY MOUTH ONCE DAILY 180 tablet 1  . metoprolol tartrate (LOPRESSOR) 25 MG tablet TAKE ONE TABLET BY MOUTH ONCE DAILY 90 tablet 1  . Multiple Vitamins-Iron (MULTIVITAMIN/IRON PO) Take 1 tablet by mouth daily.     [name] omeprazole (PRILOSEC) 40 MG capsule Take 1 capsule (40 mg total) by mouth daily. 30 capsule 3  . Oxycodone HCl 10 MG TABS 10 mg every 4 (four) hours as needed. 1 every 6 hours for pain    . Probiotic Product  (ALIGN) 4 MG CAPS Take 1 capsule (4 mg total) by mouth daily. 60 capsule 0  . promethazine (PHENERGAN) 25 MG tablet TAKE ONE TABLET BY MOUTH EVERY 6 HOURS AS NEEDED FOR NAUSEA AND VOMITING 90 tablet 3  . simvastatin (  ZOCOR) 40 MG tablet Take 1 tablet (40 mg total) by mouth at bedtime. 90 tablet 3  . Specialty Vitamins Products (MAGNESIUM, AMINO ACID CHELATE,) 133 MG tablet Take 1 tablet by mouth daily.    [name] 0.9 %  sodium chloride infusion      No facility-administered medications prior to visit.     Allergies  Allergen Reactions  . Cymbalta [Duloxetine Hcl]     Facial edema  . Iohexol      Code: HIVES, Desc: pt states her face and throat swells when administered IV contrast - KB, Onset Date: [id number]   . Mepergan [Meperidine-Promethazine]     Cardiac arrest  . Morphine Rash  . Augmentin [Amoxicillin-Pot Clavulanate] Nausea Only  . Citalopram Other (See Comments)    Jittery/heart racing   . Hctz [Hydrochlorothiazide] Other (See Comments)    Too much urinating, felt dried out, etc--NO ALLERGY  . Lexapro [Escitalopram Oxalate] Other (See Comments)    Jittery,heart racing  . Lyrica [Pregabalin] Swelling    Face, hands, fingers, ankles  . Valsartan     REACTION: pt states INTOL to Diovan    ROS As per HPI  PE: Blood pressure 135/81, pulse 61, temperature 98.5 F (36.9 C), temperature source Oral, resp. rate 16, height _0  (1.676 m), weight 176 lb (79.8 kg), SpO2 96 %. Gen: Alert, well appearing.  Patient is oriented to person, place, time, and situation. AFFECT: pleasant, lucid thought and speech. IOX:BDZH: no injection, icteris, swelling, or exudate.  EOMI, PERRLA. Mouth: lips without lesion/swelling.  Oral mucosa pink and moist. Oropharynx without erythema, exudate, or swelling.  CV: RRR, no m/r/g.   LUNGS: CTA bilat, nonlabored resps, good aeration in all lung fields. ABD: soft,  ND, BS normal.  She has RLQ TTP w/out guarding or rebound tenderness.   Her abd tenderness  does not change when she is asked to do a crunch/sit up while I palpate in RLQ.  No hepatospenomegaly or mass.  No bruits. EXT: no clubbing, cyanosis, or edema.     LABS:    Chemistry      Component Value Date/Time   NA 138 03/25/2017 1333   NA 138 11/25/2016 1147   K 4.3 03/25/2017 1333   K 3.8 11/25/2016 1147   CL 104 03/25/2017 1333   CL 103 01/15/2010 0835   CO2 24 03/25/2017 1333   CO2 25 11/25/2016 1147   BUN 12 03/25/2017 1333   BUN 15.3 11/25/2016 1147   CREATININE 0.76 03/25/2017 1333   CREATININE 0.8 11/25/2016 1147      Component Value Date/Time   CALCIUM 9.5 03/25/2017 1333   CALCIUM 9.2 11/25/2016 1147   ALKPHOS 111 03/25/2017 1333   ALKPHOS 99 11/25/2016 1147   AST 13 03/25/2017 1333   AST 24 11/25/2016 1147   ALT 15 03/25/2017 1333   ALT 30 11/25/2016 1147   BILITOT 0.4 03/25/2017 1333   BILITOT 0.61 11/25/2016 1147     Lab Results  Component Value Date   VITAMINB12 346 07/03/2012   Lab Results  Component Value Date   WBC 7.7 03/25/2017   HGB 13.5 03/25/2017   HCT 40.6 03/25/2017   MCV 88 03/25/2017   PLT 184 03/25/2017    IMPRESSION AND PLAN:  1) RLQ abd pain, subacute, intermittent. No red flags for infectious etiology or inflammatory etiology. Suspect either abd wall pain OR some colonic spasm associated with her constipation. Plan is for watchful waiting approach as well as constipation meds as discussed  in #2 below. No NSAIDs recommended (for possible abd wall pain) b/c of pt's recent hx of gastritis.  2) Chronic constipation; highly associated with chronic opioid pain med use. Recommended 2 otc senakot s tabs every night.  3) Chronic fatigue: vit B12 check not necessary given her normal hb and MCV recently. Magnesium level will be checked, though.  4) Vit D deficiency: recheck vit D level today.  An After Visit Summary was printed and given to the patient.  FOLLOW UP: Return if symptoms worsen or fail to improve.  Signed:  Crissie Sickles, MD           04/25/2017

## 2017-04-25 NOTE — Patient Instructions (Signed)
Take 2 over the counter generic senakot S tabs every night (senna S is a common generic name for this) for treatment of constipation.

## 2017-04-26 ENCOUNTER — Encounter: Payer: Self-pay | Admitting: *Deleted

## 2017-04-26 LAB — MAGNESIUM: MAGNESIUM: 2.1 mg/dL (ref 1.5–2.5)

## 2017-04-26 LAB — VITAMIN D 25 HYDROXY (VIT D DEFICIENCY, FRACTURES): VIT D 25 HYDROXY: 32 ng/mL (ref 30–100)

## 2017-04-27 ENCOUNTER — Telehealth: Payer: Self-pay | Admitting: Internal Medicine

## 2017-04-27 NOTE — Telephone Encounter (Signed)
If she finds CPAP not tolerable, then maybe she will just do best if she stops it for now until we can see her again as scheduled.

## 2017-04-27 NOTE — Telephone Encounter (Signed)
Spoke with pt,aware of recs.  Nothing further needed.  

## 2017-04-27 NOTE — Telephone Encounter (Signed)
Spoke with pt regarding another issue, states she was contacted today by sleep lab and was told that her sleep study can be moved to 8/9 if her insurance has approved the sleep study.  I advised pt that our computer system was down for several hours today, and that the PCCs have left for the day, but that I would send this message to be addressed tomorrow morning.    PCC's please advise if sleep study PA has been approved.  Thanks!

## 2017-04-27 NOTE — Telephone Encounter (Signed)
Will forward to [facility] to advise of appt. Delay in response due to computer system down.

## 2017-04-27 NOTE — Telephone Encounter (Signed)
Called and spoke with pt. Pt states she is having a lot of gastritis at night caused by cpap.  Pt states due to not being able to sleep at night, she is having Fibromyalgia flare. Pt states she f/u with her PCP on 04/25/17 for abdominal pain.   CY please advise. Thanks.  Current Outpatient Prescriptions on File Prior to Visit  Medication Sig Dispense Refill  . acetaminophen (TYLENOL) 325 MG tablet Take 650 mg by mouth every 6 (six) hours as needed.    . ALPRAZolam (XANAX) 0.5 MG tablet TAKE ONE-HALF TO ONE TABLET BY MOUTH THREE TIMES DAILY 270 tablet 1  . Azelastine HCl (ASTEPRO) 0.15 % SOLN 2 sprays each nostril every 12 hours (Patient taking differently: Place 2 sprays into the nose 2 (two) times daily as needed (Allergies). 2 sprays each nostril every 12 hours) 30 mL 11  . calcium carbonate (OSCAL) 1500 (600 Ca) MG TABS tablet Take by mouth daily.    . Cholecalciferol (VITAMIN D) 2000 UNITS CAPS Take 5 capsules by mouth daily.     . cyclobenzaprine (FLEXERIL) 5 MG tablet     . dicyclomine (BENTYL) 20 MG tablet Take 1 tablet (20 mg total) by mouth 2 (two) times daily. 60 tablet 3  . diphenhydrAMINE (BENADRYL) 25 MG tablet Take 25 mg by mouth at bedtime as needed.    . fish oil-omega-3 fatty acids 1000 MG capsule Take 2 g by mouth daily.     . hydrocortisone 2.5 % cream Apply 1 application topically as needed. RECTAL ITCHING    . Inositol Niacinate 500 MG CAPS Take 1 capsule by mouth at bedtime.    [name] levothyroxine (SYNTHROID, LEVOTHROID) 75 MCG tablet TAKE 1/2 (ONE-HALF) TABLET BY MOUTH ONCE DAILY 45 tablet 0  . meclizine (ANTIVERT) 25 MG tablet Take 1 tablet (25 mg total) by mouth every 6 (six) hours as needed for dizziness. 360 tablet 3  . MELATONIN PO Take by mouth.    . meloxicam (MOBIC) 7.5 MG tablet TAKE TWO TABLETS BY MOUTH ONCE DAILY 180 tablet 1  . metoprolol tartrate (LOPRESSOR) 25 MG tablet TAKE ONE TABLET BY MOUTH ONCE DAILY 90 tablet 1  . Multiple Vitamins-Iron (MULTIVITAMIN/IRON PO)  Take 1 tablet by mouth daily.     [name] omeprazole (PRILOSEC) 40 MG capsule Take 1 capsule (40 mg total) by mouth daily. 30 capsule 3  . Oxycodone HCl 10 MG TABS 10 mg every 4 (four) hours as needed. 1 every 6 hours for pain    . Probiotic Product (ALIGN) 4 MG CAPS Take 1 capsule (4 mg total) by mouth daily. 60 capsule 0  . promethazine (PHENERGAN) 25 MG tablet TAKE ONE TABLET BY MOUTH EVERY 6 HOURS AS NEEDED FOR NAUSEA AND VOMITING 90 tablet 3  . simvastatin (ZOCOR) 40 MG tablet Take 1 tablet (40 mg total) by mouth at bedtime. 90 tablet 3  . Specialty Vitamins Products (MAGNESIUM, AMINO ACID CHELATE,) 133 MG tablet Take 1 tablet by mouth daily.     No current facility-administered medications on file prior to visit.     Allergies  Allergen Reactions  . Cymbalta [Duloxetine Hcl]     Facial edema  . Iohexol      Code: HIVES, Desc: pt states her face and throat swells when administered IV contrast - KB, Onset Date: [id number]   . Mepergan [Meperidine-Promethazine]     Cardiac arrest  . Morphine Rash  . Augmentin [Amoxicillin-Pot Clavulanate] Nausea Only  . Citalopram Other (See Comments)  Jittery/heart racing   . Hctz [Hydrochlorothiazide] Other (See Comments)    Too much urinating, felt dried out, etc--NO ALLERGY  . Lexapro [Escitalopram Oxalate] Other (See Comments)    Jittery,heart racing  . Lyrica [Pregabalin] Swelling    Face, hands, fingers, ankles  . Valsartan     REACTION: pt states INTOL to Diovan

## 2017-04-28 ENCOUNTER — Ambulatory Visit (HOSPITAL_BASED_OUTPATIENT_CLINIC_OR_DEPARTMENT_OTHER): Payer: Medicare Other | Attending: Internal Medicine | Admitting: Internal Medicine

## 2017-04-28 VITALS — Ht 66.0 in | Wt 175.0 lb

## 2017-04-28 DIAGNOSIS — G4733 Obstructive sleep apnea (adult) (pediatric): Secondary | ICD-10-CM | POA: Insufficient documentation

## 2017-04-28 NOTE — Telephone Encounter (Signed)
Pt's insurance does not have to be precerted and I have spoken to [email or web address]  lab pt ok to go tonight pt will be called and moved [place]

## 2017-04-29 ENCOUNTER — Other Ambulatory Visit (HOSPITAL_BASED_OUTPATIENT_CLINIC_OR_DEPARTMENT_OTHER): Payer: Self-pay

## 2017-04-29 DIAGNOSIS — G4733 Obstructive sleep apnea (adult) (pediatric): Secondary | ICD-10-CM

## 2017-04-30 DIAGNOSIS — G4733 Obstructive sleep apnea (adult) (pediatric): Secondary | ICD-10-CM

## 2017-04-30 NOTE — Procedures (Signed)
Patient Name: [name], [name] Date: 04/28/2017 Gender: Female D.O.B: 17-Jul-1946 Age (years): 48 Referring Provider: Baird Lyons MD, ABSM Height (inches): 66 Interpreting Physician: Baird Lyons MD, ABSM Weight (lbs): 175 RPSGT: Baxter Flattery BMI: 28 MRN: [id number] Neck Size: 15.00 CLINICAL INFORMATION The patient is referred for a CPAP titration to treat sleep apnea.  Date of NPSG, Split Night or HST:  Unattended HST 02/15/17  AHI 17/ hr, desaturation to 79%, body weight 178.6 lbs.               Original NPSG 07/25/09 AHI 25/ hr. CPAP titration 09/01/09 incomplete control at high pressures- recommended then to return for BIPAP titration.  SLEEP STUDY TECHNIQUE As per the AASM Manual for the Scoring of Sleep and Associated Events v2.3 (April 2016) with a hypopnea requiring 4% desaturations.  The channels recorded and monitored were frontal, central and occipital EEG, electrooculogram (EOG), submentalis EMG (chin), nasal and oral airflow, thoracic and abdominal wall motion, anterior tibialis EMG, snore microphone, electrocardiogram, and pulse oximetry. Continuous positive airway pressure (CPAP) was initiated at the beginning of the study and titrated to treat sleep-disordered breathing.  MEDICATIONS Medications self-administered by patient taken the night of the study : MELATONIN, ALPRAZOLAM  TECHNICIAN COMMENTS Comments added by technician: Patient had difficulty initiating sleep. Patient was restless all through the night.  Comments added by scorer: N/A RESPIRATORY PARAMETERS Optimal PAP Pressure (cm): 9 AHI at Optimal Pressure (/hr): 0.0 Overall Minimal O2 (%): 79.00 Supine % at Optimal Pressure (%): 36 Minimal O2 at Optimal Pressure (%): 91.0    SLEEP ARCHITECTURE The study was initiated at 10:42:34 PM and ended at 5:08:22 AM.  Sleep onset time was 22.2 minutes and the sleep efficiency was 49.4%. The total sleep time was 190.5 minutes.  The patient spent 6.04% of  the night in stage N1 sleep, 93.96% in stage N2 sleep, 0.00% in stage N3 and 0.00% in REM.Stage REM latency was N/A minutes  Wake after sleep onset was 173.1. Alpha intrusion was absent. Supine sleep was 18.25%.  CARDIAC DATA The 2 lead EKG demonstrated sinus rhythm. The mean heart rate was 69.22 beats per minute. Other EKG findings include: None.  LEG MOVEMENT DATA The total Periodic Limb Movements of Sleep (PLMS) were 0. The PLMS index was 0.00. A PLMS index of <15 is considered normal in adults.  IMPRESSIONS - The optimal PAP pressure was 9 cm of water. - Central sleep apnea was not noted during this titration (CAI = 0.0/h). - Severe oxygen desaturations were observed during this titration (min O2 = 79.00%; Min 91% at CPAP 9). - No snoring was audible during this study. - No cardiac abnormalities were observed during this study. - Clinically significant periodic limb movements were not noted during this study. Arousals associated with PLMs were rare. - Patient had difficulty initiating and maintaining sleep, described as restless, with pain through night.  DIAGNOSIS - Obstructive Sleep Apnea (327.23 [G47.33 ICD-10])  RECOMMENDATIONS - Trial of CPAP therapy on 9 cm H2O with a Medium size Resmed Full Face Mask AirFit F20 mask and heated humidification. - Sleep hygiene should be reviewed to assess factors that may improve sleep quality. - Weight management and regular exercise should be initiated or continued.  [Electronically signed] 04/30/2017 10:43 AM  Baird Lyons MD, ABSM Diplomate, American Board of Sleep Medicine   NPI: [id number]  [place], American Board of Sleep Medicine  ELECTRONICALLY SIGNED ON:  04/30/2017, 10:36 AM [place]: [phone]  [place]: [phone] [place]

## 2017-05-04 DIAGNOSIS — M25562 Pain in left knee: Secondary | ICD-10-CM | POA: Diagnosis not present

## 2017-05-04 DIAGNOSIS — M7061 Trochanteric bursitis, right hip: Secondary | ICD-10-CM | POA: Diagnosis not present

## 2017-05-09 ENCOUNTER — Ambulatory Visit (INDEPENDENT_AMBULATORY_CARE_PROVIDER_SITE_OTHER): Payer: Medicare Other | Admitting: Internal Medicine

## 2017-05-09 ENCOUNTER — Encounter: Payer: Self-pay | Admitting: Internal Medicine

## 2017-05-09 VITALS — BP 116/74 | HR 54 | Ht 66.0 in | Wt 166.8 lb

## 2017-05-09 DIAGNOSIS — M961 Postlaminectomy syndrome, not elsewhere classified: Secondary | ICD-10-CM | POA: Diagnosis not present

## 2017-05-09 DIAGNOSIS — G894 Chronic pain syndrome: Secondary | ICD-10-CM | POA: Diagnosis not present

## 2017-05-09 DIAGNOSIS — Z79891 Long term (current) use of opiate analgesic: Secondary | ICD-10-CM | POA: Diagnosis not present

## 2017-05-09 DIAGNOSIS — M47817 Spondylosis without myelopathy or radiculopathy, lumbosacral region: Secondary | ICD-10-CM | POA: Diagnosis not present

## 2017-05-09 DIAGNOSIS — R682 Dry mouth, unspecified: Secondary | ICD-10-CM | POA: Diagnosis not present

## 2017-05-09 DIAGNOSIS — M1712 Unilateral primary osteoarthritis, left knee: Secondary | ICD-10-CM | POA: Diagnosis not present

## 2017-05-09 DIAGNOSIS — G4733 Obstructive sleep apnea (adult) (pediatric): Secondary | ICD-10-CM | POA: Diagnosis not present

## 2017-05-09 DIAGNOSIS — K5900 Constipation, unspecified: Secondary | ICD-10-CM | POA: Diagnosis not present

## 2017-05-09 DIAGNOSIS — F329 Major depressive disorder, single episode, unspecified: Secondary | ICD-10-CM | POA: Diagnosis not present

## 2017-05-09 DIAGNOSIS — M4807 Spinal stenosis, lumbosacral region: Secondary | ICD-10-CM | POA: Diagnosis not present

## 2017-05-09 DIAGNOSIS — G4700 Insomnia, unspecified: Secondary | ICD-10-CM | POA: Diagnosis not present

## 2017-05-09 DIAGNOSIS — M62830 Muscle spasm of back: Secondary | ICD-10-CM | POA: Diagnosis not present

## 2017-05-09 NOTE — Assessment & Plan Note (Signed)
Air swallowing is magnified by CPAP, causing gas pains. Plan-try just with chinstrap, try reducing CPAP pressure to 8, and we are also referring for consideration of an oral appliance instead of CPAP. If her own dentist makes these devices, she may choose to go there.

## 2017-05-09 NOTE — Patient Instructions (Signed)
Order- DME [facility]- please reduce CPAP pressure to 8 cwp   Dx OSA  Order- referral to Dr Oneal Grout orthodontist    Consider oral appliance for OSA  Ok to try using just the chin strap. See if it prevents snoring and you can sleep restfully with it.   Please call as needed

## 2017-05-09 NOTE — Progress Notes (Signed)
01/18/2017- 71 year old female never smoker with history OSA last seen in 2010 and now here to reestablish. FOLLOW UP FOR OSA WITH HOME HEALTH CARE Other problems include HBP, GERD, hypothyroid, degenerative disc disease NPSG- 07/25/09-Complex Sleep Apnea with both obstructive and central apnea events, distinctly worse while supine and in rem. AHI 25 (24 central apneas, 19 mixed apneas and the remainder obstructive). REM AHI 77/hour. Desaturation to 74%, body weight 190 pounds She tried wearing CPAP after initial diagnosis but struggled with persistent mask leak, settling on a fullface mask. She eventually stop CPAP because of cost with no insurance at that time. She does remember CPAP did help sleep quality. Now she complains of always feeling tired. Husband has witnessed apneas and tells her she snores loudly. Bedtime around 9:30 PM but consistently waking around 11:30 and again around 3 AM. Takes melatonin and Xanax 0.5 mg at bedtime. Takes oxycodone, Tylenol, meloxicam when needed during the day for pain related to complicated right knee replacement and old spinal fusions. Little caffeine. No naps feeling that she couldn't sleep if she wanted to. Blames pain medications for dry mouth and hoarseness. We reviewed her medical list noting several antihistamines which might contribute to this. Also history of GERD. ENT surgery-left nasal polyp with sinus obstruction repaired and no problems since.  04/06/17-Addendum to update-we have made a series of adjustments in CPAP pressure using auto titration but have been unable to provide adequate control of her complex obstructive/central events at pressures that she can tolerate. Most recently auto range 5-18 has resulted in complaints of swallowed air and gas discomfort. Her home study, like the original NPSG, shown a complex apnea pattern with obstructive and central events. I think we have done all we can with AutoPap. I am going to recommend a trial of ASV:  5-12, PS min 4, max 12, Breath Rate auto BrPM.  05/09/17-  71 year old female never smoker followed for OSA, complicated by HBP, GERD, hypothyroid, degenerative disc disease, depression She reported ongoing problems with gas pain and cramping on CPAP, reflecting swallowed air. Tried BiPAP with no improvement. DME told us she would need  CPAP auto 5-15/[facility]> today reduce to 8 Unattended Home Sleep Test 02/15/17-AHI 17/hour, desaturation to 79%, body weight 178 pounds  CPAP titration study 04/28/17 recommended 9 CWP. Sleep quality was restless with report of pain throughout the nigh Follow up for OSA. Pt had a CPAP titration on 04/28/17. Settings were changed on 05/04/17 by [facility].t. She has repeatedly complained of gas pains and stomach cramps using CPAP. Initial titration set indicated need for high pressures. CPAP titration indicated good control at 9 CWP which is much lower than she had been previously set on. With this she had much less cramping. She also has trouble with dry mouth from medications, not relieved by CPAP humidifier. If the humidifier setting is high for increased moisture, the air is too hot for her. Using Xymelt lozenges for dry mouth.  ROS-see HPI    "+" = pos Constitutional:    weight loss, night sweats, fevers, chills,+ fatigue, lassitude. HEENT:    headaches, difficulty swallowing, tooth/dental problems, sore throat,       sneezing, itching, ear ache, nasal congestion, post nasal drip, snoring CV:    chest pain, orthopnea, PND, swelling in lower extremities, anasarca,  dizziness, palpitations Resp:   shortness of breath with exertion or at rest.                productive cough,   non-productive cough, coughing up of blood.              change in color of mucus.  wheezing.   Skin:    rash or lesions. GI:  No-   heartburn, indigestion, abdominal pain, nausea, vomiting, diarrhea,                 change in bowel habits, loss of  appetite GU: dysuria, change in color of urine, no urgency or frequency.   flank pain. MS:   joint pain, stiffness, decreased range of motion, +back pain. Neuro-     nothing unusual Psych:  change in mood or affect.  depression or anxiety.   memory loss.  OBJ- Physical Exam General- Alert, Oriented, Affect-appropriate, Distress- none acute   + air swallowing and borborygmi noted Skin- rash-none, lesions- none, excoriation- none Lymphadenopathy- none Head- atraumatic            Eyes- Gross vision intact, PERRLA, conjunctivae and secretions clear            Ears- Hearing, canals-normal            Nose- Clear, no-Septal dev, mucus, polyps, erosion, perforation             Throat- Mallampati II-III , mucosa- drier than average , drainage- none, tonsils- atrophic, + Dry mouth, dental repair Neck- flexible , trachea midline, no stridor , thyroid nl, carotid no bruit Chest - symmetrical excursion , unlabored           Heart/CV- RRR , no murmur , no gallop  , no rub, nl s1 s2                           - JVD- none , edema- none, stasis changes- none, varices- none           Lung- clear to P&A, wheeze- none, cough- none , dullness-none, rub- none           Chest wall-  Abd-  Br/ Gen/ Rectal- Not done, not indicated Extrem- cyanosis- none, clubbing, none, atrophy- none, strength- nl Neuro- grossly intact to observation

## 2017-05-09 NOTE — Assessment & Plan Note (Signed)
She blames medication. Xymelt lozenges some benefit.

## 2017-05-11 ENCOUNTER — Telehealth: Payer: Self-pay | Admitting: Internal Medicine

## 2017-05-11 DIAGNOSIS — G4733 Obstructive sleep apnea (adult) (pediatric): Secondary | ICD-10-CM

## 2017-05-11 NOTE — Telephone Encounter (Signed)
Ok to order DME please dc CPAP at patient request.

## 2017-05-11 NOTE — Telephone Encounter (Signed)
Spoke with patient. She is aware that CY is ok with discontinuing the CPAP. Order has been placed to [facility]. Nothing else needed at time of call.

## 2017-05-11 NOTE — Telephone Encounter (Signed)
Spoke with patient. She has tried to sleep with her CPAP machine since her visit on 8/20. She is only to sleep about 20-25 mins. When she woke up this morning, she stated that she felt "full of gas" and miserable. She wants to discontinue using the CPAP machine and see what the oral appliance will do for her. They are supposed to reach out to her on Thursday to schedule her for her appointment.   Pt uses [facility] as her DME.   CY, please advise. Thanks!

## 2017-05-18 ENCOUNTER — Encounter: Payer: Self-pay | Admitting: Family Medicine

## 2017-05-26 ENCOUNTER — Ambulatory Visit: Payer: Medicare Other | Admitting: Internal Medicine

## 2017-06-02 ENCOUNTER — Other Ambulatory Visit: Payer: Medicare Other

## 2017-06-02 ENCOUNTER — Ambulatory Visit: Payer: Medicare Other | Admitting: Hematology & Oncology

## 2017-06-08 ENCOUNTER — Encounter (HOSPITAL_BASED_OUTPATIENT_CLINIC_OR_DEPARTMENT_OTHER): Payer: Medicare Other

## 2017-06-09 DIAGNOSIS — M961 Postlaminectomy syndrome, not elsewhere classified: Secondary | ICD-10-CM | POA: Diagnosis not present

## 2017-06-09 DIAGNOSIS — K5900 Constipation, unspecified: Secondary | ICD-10-CM | POA: Diagnosis not present

## 2017-06-09 DIAGNOSIS — M62830 Muscle spasm of back: Secondary | ICD-10-CM | POA: Diagnosis not present

## 2017-06-09 DIAGNOSIS — G894 Chronic pain syndrome: Secondary | ICD-10-CM | POA: Diagnosis not present

## 2017-06-13 ENCOUNTER — Other Ambulatory Visit: Payer: Self-pay | Admitting: Family Medicine

## 2017-07-08 ENCOUNTER — Other Ambulatory Visit: Payer: Self-pay | Admitting: Family Medicine

## 2017-07-08 DIAGNOSIS — M62830 Muscle spasm of back: Secondary | ICD-10-CM | POA: Diagnosis not present

## 2017-07-08 DIAGNOSIS — G894 Chronic pain syndrome: Secondary | ICD-10-CM | POA: Diagnosis not present

## 2017-07-08 DIAGNOSIS — M961 Postlaminectomy syndrome, not elsewhere classified: Secondary | ICD-10-CM | POA: Diagnosis not present

## 2017-07-08 DIAGNOSIS — K5900 Constipation, unspecified: Secondary | ICD-10-CM | POA: Diagnosis not present

## 2017-07-20 DIAGNOSIS — M545 Low back pain: Secondary | ICD-10-CM | POA: Diagnosis not present

## 2017-07-20 DIAGNOSIS — M7061 Trochanteric bursitis, right hip: Secondary | ICD-10-CM | POA: Diagnosis not present

## 2017-07-22 ENCOUNTER — Ambulatory Visit (INDEPENDENT_AMBULATORY_CARE_PROVIDER_SITE_OTHER): Payer: Medicare Other | Admitting: Family Medicine

## 2017-07-22 ENCOUNTER — Encounter: Payer: Self-pay | Admitting: *Deleted

## 2017-07-22 ENCOUNTER — Encounter: Payer: Self-pay | Admitting: Family Medicine

## 2017-07-22 VITALS — BP 153/80 | HR 55 | Temp 97.9°F | Resp 16 | Ht 66.0 in | Wt 174.5 lb

## 2017-07-22 DIAGNOSIS — K5903 Drug induced constipation: Secondary | ICD-10-CM | POA: Diagnosis not present

## 2017-07-22 DIAGNOSIS — M797 Fibromyalgia: Secondary | ICD-10-CM

## 2017-07-22 DIAGNOSIS — R350 Frequency of micturition: Secondary | ICD-10-CM

## 2017-07-22 DIAGNOSIS — R3915 Urgency of urination: Secondary | ICD-10-CM

## 2017-07-22 DIAGNOSIS — T402X5A Adverse effect of other opioids, initial encounter: Secondary | ICD-10-CM

## 2017-07-22 DIAGNOSIS — R8290 Unspecified abnormal findings in urine: Secondary | ICD-10-CM | POA: Diagnosis not present

## 2017-07-22 DIAGNOSIS — Z79899 Other long term (current) drug therapy: Secondary | ICD-10-CM

## 2017-07-22 DIAGNOSIS — I10 Essential (primary) hypertension: Secondary | ICD-10-CM

## 2017-07-22 DIAGNOSIS — F411 Generalized anxiety disorder: Secondary | ICD-10-CM | POA: Diagnosis not present

## 2017-07-22 DIAGNOSIS — E7800 Pure hypercholesterolemia, unspecified: Secondary | ICD-10-CM

## 2017-07-22 DIAGNOSIS — K5909 Other constipation: Secondary | ICD-10-CM | POA: Diagnosis not present

## 2017-07-22 LAB — POCT URINALYSIS DIPSTICK
BILIRUBIN UA: NEGATIVE
Glucose, UA: NEGATIVE
KETONES UA: NEGATIVE
LEUKOCYTES UA: NEGATIVE
Nitrite, UA: NEGATIVE
PH UA: 5.5 (ref 5.0–8.0)
Protein, UA: NEGATIVE
Spec Grav, UA: 1.02 (ref 1.010–1.025)
Urobilinogen, UA: 0.2 E.U./dL

## 2017-07-22 NOTE — Progress Notes (Signed)
OFFICE VISIT  07/22/2017   CC:  Chief Complaint  Patient presents with  . Follow-up    RCI   HPI:    Patient is a 71 y.o. Caucasian female who presents for f/u of HTN, HLD, and chronic anxiety. She has a multitude of medical problems, the most bothersome for her being fibromyalgia and IBS.  Says she is going through a bad spell of fibromyalgia currently.  This is what she was originally rx'd her alprazolam for. We discussed CSC today for this med.  She gets UDS regularly through her pain mgmt MD and she will have these results faxed to my office instead of duplicating UDS's here.  She has another L spine MRI soon to re-eval her DDD and see if any further intervention is needed.  Onset about 10 days ago, urinary frequency, urgency, odor to urine.  No dysuria.  Seems to be getting a little worse over time.  Says she feels like hydration is going ok.  HTN: home monitoring always 120s-130s over 70s-80s, HR upper 50s.  HLD: tolerating simvastatin good.  Not fasting today.  Has chronic constip--mostly due to chronic opioid use, lots of gas.  Has daily BM but they are small "balls".  Taking senakot S almost daily.  ROS: intermittent abd discomfort, roving, intermittent loose BMs.  No joint swelling or redness.  No rash.  No SOB or CP. No melena or hematochezia.  Past Medical History:  Diagnosis Date  . Allergic rhinitis   . Anxiety   . Chest pain    cr  . Chronic gastritis    dx'd by EGD 2018 (erosive, likely NSAID-induced).  . Chronic pain syndrome    right knee osteo, hx of TKA in R knee.  Pt was told by [name] that no further surgical options are available for her--[name]  . Chronic renal insufficiency, stage II (mild) 2015   vs acute fluctuations depending on hydration?---GFR from 60s up to 80s.  . Diverticulosis of colon    with hx of 'itis  . DJD (degenerative joint disease)    L spine and knees  . Epigastric pain    As of 01/2017, GI (Dr. Loletha Carrow)  planned EGD.  [name] FATIGUE / MALAISE 07/01/2009   Chronic  . Fibromyalgia   . GERD (gastroesophageal reflux disease)   . History of colonic polyps   . HTN (hypertension)   . Hypercholesterolemia   . Hypothyroid 07/05/2012  . IBS (irritable bowel syndrome)   . IC (interstitial cystitis)    [name] at [place]  . Iron deficiency anemia, unspecified 06/27/2013   Has required IV iron on multiple occasions.  Hemoccults neg 12/2016.  . Lumbar back pain   . Major depression, recurrent ([place])    vs dysthymia,chronic  . Memory impairment 10/03/2012   Memory impairment 10/03/2012 >referral to neuro    . Monoclonal gammopathy of undetermined significance    IgM kappa MGUS ([name]) routine f/u has shown stability of this problem.  . OSA (obstructive sleep apnea)    Latest sleep study 01/2017.  Some adjustments to CPAP being made+ dental referral for consideration of oral appliance --per pulm MD.  . Osteopenia 04/2014   T score -2.0 FRAX 11%/1.8%  . Otitis externa, candida 66/4403   [name] tx'd her with clotrimazole 1% external solution x 1 mo  . Palpitations   . Psoriasis   . Vitamin D deficiency   . Xerostomia    and xerophthalmia--per [name] (he  suggests SSA, SSB, ESR, rh factor, ANA, and antimicrosmal and antithyroglobulin ab's as of 02/17/16--all these labs were NORMAL.    Past Surgical History:  Procedure Laterality Date  . ABDOMINAL HYSTERECTOMY     for prolapse with abdominal incision  . BACK SURGERY  [id number]   L2-3 fusion ([name])  . CARDIAC CATHETERIZATION     no PCI  . CHOLECYSTECTOMY    . COLONOSCOPY  04/2006   Diverticulosis, o/w normal.  . ESOPHAGOGASTRODUODENOSCOPY  05/29/2009; 03/01/17   Esoph normal.  Gastritis (H pylori NEG) + gastric polyps (benign).  Duodenal biopsy NORMAL.  2018-mild chronic gastritis, H pylori NEG.  . EYE SURGERY Left    "lazy eye"  . HAND SURGERY Right    TRIGGER FINGER; index finger  . HERNIA REPAIR     umbilical  . KNEE  ARTHROSCOPY     RIGHT KNEE X 5  . LAMINOTOMY  2010   L-4, L-5 FUSION  . NASAL SINUS SURGERY     polypectomy (remote past)  . OOPHORECTOMY  2010   Laparoscopic BSO (path benign)  . PATELLA FRACTURE SURGERY  10-08   [name]  . POSTERIOR LAMINECTOMY / DECOMPRESSION LUMBAR SPINE  9-09   [name]  . TOTAL KNEE ARTHROPLASTY  12-07 and 4-07   [name], revision 12-09 [name] at [facility]    Outpatient Medications Prior to Visit  Medication Sig Dispense Refill  . acetaminophen (TYLENOL) 325 MG tablet Take 650 mg by mouth every 6 (six) hours as needed.    . ALPRAZolam (XANAX) 0.5 MG tablet TAKE ONE-HALF TO ONE TABLET BY MOUTH THREE TIMES DAILY 270 tablet 1  . Azelastine HCl (ASTEPRO) 0.15 % SOLN 2 sprays each nostril every 12 hours (Patient taking differently: Place 2 sprays into the nose 2 (two) times daily as needed (Allergies). 2 sprays each nostril every 12 hours) 30 mL 11  . calcium carbonate (OSCAL) 1500 (600 Ca) MG TABS tablet Take by mouth daily.    . Cholecalciferol (VITAMIN D) 2000 UNITS CAPS Take 5 capsules by mouth daily.     . cyclobenzaprine (FLEXERIL) 5 MG tablet     . dicyclomine (BENTYL) 20 MG tablet Take 1 tablet (20 mg total) by mouth 2 (two) times daily. 60 tablet 3  . diphenhydrAMINE (BENADRYL) 25 MG tablet Take 25 mg by mouth at bedtime as needed.    . fish oil-omega-3 fatty acids 1000 MG capsule Take 2 g by mouth daily.     . hydrocortisone 2.5 % cream Apply 1 application topically as needed. RECTAL ITCHING    . Inositol Niacinate 500 MG CAPS Take 1 capsule by mouth at bedtime.    [name] levothyroxine (SYNTHROID, LEVOTHROID) 75 MCG tablet TAKE 1/2 (ONE-HALF) TABLET BY MOUTH ONCE DAILY 45 tablet 3  . meclizine (ANTIVERT) 25 MG tablet Take 1 tablet (25 mg total) by mouth every 6 (six) hours as needed for dizziness. 360 tablet 3  . MELATONIN PO Take by mouth.    . meloxicam (MOBIC) 7.5 MG tablet TAKE TWO TABLETS BY MOUTH ONCE DAILY 180 tablet 1  . metoprolol tartrate (LOPRESSOR)  25 MG tablet TAKE ONE TABLET BY MOUTH ONCE DAILY 90 tablet 1  . Multiple Vitamins-Iron (MULTIVITAMIN/IRON PO) Take 1 tablet by mouth daily.     [name] omeprazole (PRILOSEC) 40 MG capsule Take 1 capsule (40 mg total) by mouth daily. 30 capsule 3  . Oxycodone HCl 10 MG TABS 10 mg every 4 (four) hours as needed. 1 every 6  hours for pain    . Probiotic Product (ALIGN) 4 MG CAPS Take 1 capsule (4 mg total) by mouth daily. 60 capsule 0  . promethazine (PHENERGAN) 25 MG tablet TAKE ONE TABLET BY MOUTH EVERY 6 HOURS AS NEEDED FOR NAUSEA AND VOMITING 90 tablet 3  . simvastatin (ZOCOR) 40 MG tablet Take 1 tablet (40 mg total) by mouth at bedtime. 90 tablet 3  . Specialty Vitamins Products (MAGNESIUM, AMINO ACID CHELATE,) 133 MG tablet Take 1 tablet by mouth daily.     No facility-administered medications prior to visit.     Allergies  Allergen Reactions  . Cymbalta [Duloxetine Hcl]     Facial edema  . Iohexol      Code: HIVES, Desc: pt states her face and throat swells when administered IV contrast - KB, Onset Date: [id number]   . Mepergan [Meperidine-Promethazine]     Cardiac arrest  . Morphine Rash  . Augmentin [Amoxicillin-Pot Clavulanate] Nausea Only  . Citalopram Other (See Comments)    Jittery/heart racing   . Hctz [Hydrochlorothiazide] Other (See Comments)    Too much urinating, felt dried out, etc--NO ALLERGY  . Lexapro [Escitalopram Oxalate] Other (See Comments)    Jittery,heart racing  . Lyrica [Pregabalin] Swelling    Face, hands, fingers, ankles  . Valsartan     REACTION: pt states INTOL to Diovan    ROS As per HPI  PE: Blood pressure (!) 153/80, pulse (!) 55, temperature 97.9 F (36.6 C), temperature source Oral, resp. rate 16, height '5\' 6"'$  (1.676 m), weight 174 lb 8 oz (79.2 kg), SpO2 96 %. Gen: Alert, well appearing.  Patient is oriented to person, place, time, and situation. AFFECT: pleasant, lucid thought and speech. CV: RRR, no m/r/g.   LUNGS: CTA bilat, nonlabored  resps, good aeration in all lung fields. ABD: soft, NT, ND, BS normal.  No hepatospenomegaly or mass.  No bruits.  LABS:   CC UA here today: trace intact blood, otherwise normal.  Lab Results  Component Value Date   TSH 1.06 01/20/2017   Lab Results  Component Value Date   WBC 7.7 03/25/2017   HGB 13.5 03/25/2017   HCT 40.6 03/25/2017   MCV 88 03/25/2017   PLT 184 03/25/2017   Lab Results  Component Value Date   CREATININE 0.76 03/25/2017   BUN 12 03/25/2017   NA 138 03/25/2017   K 4.3 03/25/2017   CL 104 03/25/2017   CO2 24 03/25/2017   Lab Results  Component Value Date   ALT 15 03/25/2017   AST 13 03/25/2017   ALKPHOS 111 03/25/2017   BILITOT 0.4 03/25/2017   Lab Results  Component Value Date   CHOL 209 (H) 09/23/2014   Lab Results  Component Value Date   HDL 36.60 (L) 09/23/2014   Lab Results  Component Value Date   LDLCALC 138 (H) 09/23/2014   Lab Results  Component Value Date   TRIG 172.0 (H) 09/23/2014   Lab Results  Component Value Date   CHOLHDL 6 09/23/2014   Vit D and magnesium levels normal 04/2017 here.  IMPRESSION AND PLAN:  1) HTN; The current medical regimen is effective;  continue present plan and medications. Lytes/cr when she returns for fasting labs.  2) HLD: tolerating statin.  Last cholesterol panel was almost 3 yrs ago. FLP ordered and she'll return at earliest convenience for FLP.  3) Chronic anxiety and fibromyalgia: alprazolam rx'd for both of these conditions for her. CSC signed, in chart. Expect  UDS results from her pain mgmt office to be sent to [place] in future.  4) Chronic constipation, opioid induced: continue senakot-S, add miralax qd-qod.  If this does not help then she is agreeable to trying either amitiza or linzess (she states she has never tried either one of these).  5) urinary symptoms: UA looks pretty good, just some trace blood.  Will hold off on any abx at this time and send urine for c/s.  An After Visit  Summary was printed and given to the patient.  FOLLOW UP: Return in about 6 months (around 01/19/2018) for routine chronic illness f/u.  Signed:  Crissie Sickles, MD           07/22/2017

## 2017-07-24 LAB — URINE CULTURE
MICRO NUMBER: 81235089
SPECIMEN QUALITY: ADEQUATE

## 2017-07-26 DIAGNOSIS — M545 Low back pain: Secondary | ICD-10-CM | POA: Diagnosis not present

## 2017-08-08 DIAGNOSIS — M62830 Muscle spasm of back: Secondary | ICD-10-CM | POA: Diagnosis not present

## 2017-08-08 DIAGNOSIS — G894 Chronic pain syndrome: Secondary | ICD-10-CM | POA: Diagnosis not present

## 2017-08-08 DIAGNOSIS — K5900 Constipation, unspecified: Secondary | ICD-10-CM | POA: Diagnosis not present

## 2017-08-08 DIAGNOSIS — M961 Postlaminectomy syndrome, not elsewhere classified: Secondary | ICD-10-CM | POA: Diagnosis not present

## 2017-09-05 DIAGNOSIS — M62830 Muscle spasm of back: Secondary | ICD-10-CM | POA: Diagnosis not present

## 2017-09-05 DIAGNOSIS — M961 Postlaminectomy syndrome, not elsewhere classified: Secondary | ICD-10-CM | POA: Diagnosis not present

## 2017-09-05 DIAGNOSIS — G894 Chronic pain syndrome: Secondary | ICD-10-CM | POA: Diagnosis not present

## 2017-09-05 DIAGNOSIS — K5900 Constipation, unspecified: Secondary | ICD-10-CM | POA: Diagnosis not present

## 2017-09-14 ENCOUNTER — Ambulatory Visit: Payer: Medicare Other | Admitting: Internal Medicine

## 2017-09-14 DIAGNOSIS — K5900 Constipation, unspecified: Secondary | ICD-10-CM | POA: Diagnosis not present

## 2017-09-14 DIAGNOSIS — M62830 Muscle spasm of back: Secondary | ICD-10-CM | POA: Diagnosis not present

## 2017-09-14 DIAGNOSIS — M961 Postlaminectomy syndrome, not elsewhere classified: Secondary | ICD-10-CM | POA: Diagnosis not present

## 2017-09-14 DIAGNOSIS — G894 Chronic pain syndrome: Secondary | ICD-10-CM | POA: Diagnosis not present

## 2017-09-29 DIAGNOSIS — M48062 Spinal stenosis, lumbar region with neurogenic claudication: Secondary | ICD-10-CM | POA: Diagnosis not present

## 2017-10-06 ENCOUNTER — Inpatient Hospital Stay (HOSPITAL_BASED_OUTPATIENT_CLINIC_OR_DEPARTMENT_OTHER): Payer: Medicare Other | Admitting: Hematology & Oncology

## 2017-10-06 ENCOUNTER — Inpatient Hospital Stay: Payer: Medicare Other | Attending: Hematology & Oncology

## 2017-10-06 VITALS — BP 164/78 | HR 58 | Temp 97.8°F | Resp 17 | Wt 177.5 lb

## 2017-10-06 DIAGNOSIS — K5900 Constipation, unspecified: Secondary | ICD-10-CM | POA: Diagnosis not present

## 2017-10-06 DIAGNOSIS — D510 Vitamin B12 deficiency anemia due to intrinsic factor deficiency: Secondary | ICD-10-CM

## 2017-10-06 DIAGNOSIS — M1990 Unspecified osteoarthritis, unspecified site: Secondary | ICD-10-CM | POA: Insufficient documentation

## 2017-10-06 DIAGNOSIS — D509 Iron deficiency anemia, unspecified: Secondary | ICD-10-CM | POA: Insufficient documentation

## 2017-10-06 DIAGNOSIS — D472 Monoclonal gammopathy: Secondary | ICD-10-CM | POA: Insufficient documentation

## 2017-10-06 DIAGNOSIS — G894 Chronic pain syndrome: Secondary | ICD-10-CM | POA: Diagnosis not present

## 2017-10-06 DIAGNOSIS — M961 Postlaminectomy syndrome, not elsewhere classified: Secondary | ICD-10-CM | POA: Diagnosis not present

## 2017-10-06 DIAGNOSIS — D500 Iron deficiency anemia secondary to blood loss (chronic): Secondary | ICD-10-CM

## 2017-10-06 DIAGNOSIS — M62830 Muscle spasm of back: Secondary | ICD-10-CM | POA: Diagnosis not present

## 2017-10-06 DIAGNOSIS — D508 Other iron deficiency anemias: Secondary | ICD-10-CM

## 2017-10-06 LAB — CBC WITH DIFFERENTIAL (CANCER CENTER ONLY)
Basophils Absolute: 0 10*3/uL (ref 0.0–0.1)
Basophils Relative: 0 %
Eosinophils Absolute: 0.3 10*3/uL (ref 0.0–0.5)
Eosinophils Relative: 4 %
HEMATOCRIT: 41.3 % (ref 34.8–46.6)
Hemoglobin: 13.8 g/dL (ref 11.6–15.9)
LYMPHS PCT: 37 %
Lymphs Abs: 2.6 10*3/uL (ref 0.9–3.3)
MCH: 29.3 pg (ref 26.0–34.0)
MCHC: 33.4 g/dL (ref 32.0–36.0)
MCV: 87.7 fL (ref 81.0–101.0)
MONO ABS: 0.3 10*3/uL (ref 0.1–0.9)
MONOS PCT: 4 %
NEUTROS ABS: 3.9 10*3/uL (ref 1.5–6.5)
Neutrophils Relative %: 55 %
Platelet Count: 168 10*3/uL (ref 145–400)
RBC: 4.71 MIL/uL (ref 3.70–5.32)
RDW: 12.5 % (ref 11.1–15.7)
WBC Count: 7.1 10*3/uL (ref 3.9–10.3)

## 2017-10-06 LAB — CMP (CANCER CENTER ONLY)
ALT: 18 U/L (ref 0–55)
ANION GAP: 16 — AB (ref 5–15)
AST: 18 U/L (ref 5–34)
Albumin: 3.9 g/dL (ref 3.5–5.0)
Alkaline Phosphatase: 109 U/L — ABNORMAL HIGH (ref 26–84)
BILIRUBIN TOTAL: 0.6 mg/dL (ref 0.2–1.2)
BUN: 12 mg/dL (ref 7–22)
CALCIUM: 9.2 mg/dL (ref 8.0–10.3)
CHLORIDE: 100 mmol/L (ref 98–108)
CO2: 29 mmol/L (ref 18–33)
Creatinine: 1 mg/dL (ref 0.60–1.10)
Glucose, Bld: 95 mg/dL (ref 73–118)
Potassium: 3.7 mmol/L (ref 3.5–5.1)
Sodium: 145 mmol/L (ref 128–145)
Total Protein: 7.2 g/dL (ref 6.4–8.1)

## 2017-10-06 NOTE — Progress Notes (Signed)
Hematology and Oncology Follow Up Visit  [name] [id number] 05-15-1946 72 y.o. 10/06/2017   Principle Diagnosis:  1. IgM kappa monoclonal gammopathy of undetermined significance 2. Intermittent iron deficiency anemia  Current Therapy:   IV iron as indicated - last received in April 2017   Interim History:  [name] is here today with her friend for follow-up.  We last saw her back in July.  She still has a lot of pain issues because of arthritis.  She has knee issues.  She has back and hip issues.  She sees a pain management doctor.  She will be getting some injections into her hips.  Her last M spike was 0.5 g/dL in July.  Her IgM level was 576 mg/dL.  Her kappa light chain was 1.4 mg/dL.  She might need cataract surgery.  She has had no bleeding.  There is been no change in bowel or bladder habits.  She has had no issues with nausea or vomiting.  Her weight goes up.  She ate quite well over the holidays.   ECOG Performance Status: 1 - Symptomatic but completely ambulatory  Medications:  Allergies as of 10/06/2017      Reactions   Cymbalta [duloxetine Hcl]    Facial edema   Iohexol     Code: HIVES, Desc: pt states her face and throat swells when administered IV contrast - KB, Onset Date: [id number]   Mepergan [meperidine-promethazine]    Cardiac arrest   Morphine Rash   Augmentin [amoxicillin-pot Clavulanate] Nausea Only   Citalopram Other (See Comments)   Jittery/heart racing   Hctz [hydrochlorothiazide] Other (See Comments)   Too much urinating, felt dried out, etc--NO ALLERGY   Lexapro [escitalopram Oxalate] Other (See Comments)   Jittery,heart racing   Lyrica [pregabalin] Swelling   Face, hands, fingers, ankles   Valsartan    REACTION: pt states INTOL to Diovan      Medication List        Accurate as of 10/06/17  4:47 PM. Always use your most recent med list.          acetaminophen 325 MG tablet Commonly known as:  TYLENOL Take 650 mg by mouth  every 6 (six) hours as needed.   ALIGN 4 MG Caps Take 1 capsule (4 mg total) by mouth daily.   ALPRAZolam 0.5 MG tablet Commonly known as:  XANAX TAKE ONE-HALF TO ONE TABLET BY MOUTH THREE TIMES DAILY   Azelastine HCl 0.15 % Soln Commonly known as:  ASTEPRO 2 sprays each nostril every 12 hours   calcium carbonate 1500 (600 Ca) MG Tabs tablet Commonly known as:  OSCAL Take by mouth daily.   cyclobenzaprine 5 MG tablet Commonly known as:  FLEXERIL   dicyclomine 20 MG tablet Commonly known as:  BENTYL Take 1 tablet (20 mg total) by mouth 2 (two) times daily.   diphenhydrAMINE 25 MG tablet Commonly known as:  BENADRYL Take 25 mg by mouth at bedtime as needed.   fish oil-omega-3 fatty acids 1000 MG capsule Take 2 g by mouth daily.   hydrocortisone 2.5 % cream Apply 1 application topically as needed. RECTAL ITCHING   Inositol Niacinate 500 MG Caps Take 1 capsule by mouth at bedtime.   levothyroxine 75 MCG tablet Commonly known as:  SYNTHROID, LEVOTHROID TAKE 1/2 (ONE-HALF) TABLET BY MOUTH ONCE DAILY   magnesium (amino acid chelate) 133 MG tablet Take 1 tablet by mouth daily.   meclizine 25 MG tablet Commonly known as:  ANTIVERT  Take 1 tablet (25 mg total) by mouth every 6 (six) hours as needed for dizziness.   MELATONIN PO Take by mouth.   meloxicam 7.5 MG tablet Commonly known as:  MOBIC TAKE TWO TABLETS BY MOUTH ONCE DAILY   metoprolol tartrate 25 MG tablet Commonly known as:  LOPRESSOR TAKE ONE TABLET BY MOUTH ONCE DAILY   MULTIVITAMIN/IRON PO Take 1 tablet by mouth daily.   omeprazole 40 MG capsule Commonly known as:  PRILOSEC Take 1 capsule (40 mg total) by mouth daily.   Oxycodone HCl 10 MG Tabs 10 mg every 4 (four) hours as needed. 1 every 6 hours for pain   promethazine 25 MG tablet Commonly known as:  PHENERGAN TAKE ONE TABLET BY MOUTH EVERY 6 HOURS AS NEEDED FOR NAUSEA AND VOMITING   simvastatin 40 MG tablet Commonly known as:  ZOCOR Take  1 tablet (40 mg total) by mouth at bedtime.   Vitamin D 2000 units Caps Take 5 capsules by mouth daily.       Allergies:  Allergies  Allergen Reactions  . Cymbalta [Duloxetine Hcl]     Facial edema  . Iohexol      Code: HIVES, Desc: pt states her face and throat swells when administered IV contrast - KB, Onset Date: [id number]   . Mepergan [Meperidine-Promethazine]     Cardiac arrest  . Morphine Rash  . Augmentin [Amoxicillin-Pot Clavulanate] Nausea Only  . Citalopram Other (See Comments)    Jittery/heart racing   . Hctz [Hydrochlorothiazide] Other (See Comments)    Too much urinating, felt dried out, etc--NO ALLERGY  . Lexapro [Escitalopram Oxalate] Other (See Comments)    Jittery,heart racing  . Lyrica [Pregabalin] Swelling    Face, hands, fingers, ankles  . Valsartan     REACTION: pt states INTOL to Diovan    Past Medical History, Surgical history, Social history, and Family History were reviewed and updated.  Review of Systems: Review of Systems  Constitutional: Positive for malaise/fatigue.  Eyes: Positive for blurred vision.  Respiratory: Positive for wheezing.   Cardiovascular: Positive for palpitations.  Gastrointestinal: Positive for nausea.  Genitourinary: Positive for urgency.  Musculoskeletal: Positive for back pain, joint pain and myalgias.  Neurological: Positive for tingling.  Endo/Heme/Allergies: Negative.   Psychiatric/Behavioral: Negative.      Physical Exam:  weight is 177 lb 8 oz (80.5 kg). Her oral temperature is 97.8 F (36.6 C). Her blood pressure is 164/78 (abnormal) and her pulse is 58 (abnormal). Her respiration is 17 and oxygen saturation is 99%.   Wt Readings from Last 3 Encounters:  10/06/17 177 lb 8 oz (80.5 kg)  07/22/17 174 lb 8 oz (79.2 kg)  05/09/17 166 lb 12.8 oz (75.7 kg)    Physical Exam  Lab Results  Component Value Date   WBC 7.1 10/06/2017   HGB 13.5 03/25/2017   HCT 41.3 10/06/2017   MCV 87.7 10/06/2017   PLT  168 10/06/2017   Lab Results  Component Value Date   FERRITIN 230 03/25/2017   IRON 67 03/25/2017   TIBC 253 03/25/2017   UIBC 185 03/25/2017   IRONPCTSAT 27 03/25/2017   Lab Results  Component Value Date   RETICCTPCT 1.2 01/20/2011   RBC 4.71 10/06/2017   RETICCTABS 52.2 01/20/2011   Lab Results  Component Value Date   KPAFRELGTCHN 2.17 (H) 06/19/2015   LAMBDASER 1.94 06/19/2015   KAPLAMBRATIO 0.99 03/25/2017   Lab Results  Component Value Date   IGGSERUM 613 (L) 03/25/2017  IGA 262 06/19/2015   IGMSERUM 576 (H) 03/25/2017   Lab Results  Component Value Date   TOTALPROTELP 6.9 06/19/2015   ALBUMINELP 3.9 06/19/2015   A1GS 0.3 06/19/2015   A2GS 0.9 06/19/2015   BETS 0.4 06/19/2015   BETA2SER 0.5 06/19/2015   GAMS 0.9 06/19/2015   MSPIKE 0.5 (H) 03/25/2017   SPEI * 06/19/2015     Chemistry      Component Value Date/Time   NA 145 10/06/2017 1429   NA 138 03/25/2017 1333   NA 138 11/25/2016 1147   K 3.7 10/06/2017 1429   K 4.3 03/25/2017 1333   K 3.8 11/25/2016 1147   CL 100 10/06/2017 1429   CL 104 03/25/2017 1333   CL 103 01/15/2010 0835   CO2 29 10/06/2017 1429   CO2 24 03/25/2017 1333   CO2 25 11/25/2016 1147   BUN 12 10/06/2017 1429   BUN 12 03/25/2017 1333   BUN 15.3 11/25/2016 1147   CREATININE 0.76 03/25/2017 1333   CREATININE 0.8 11/25/2016 1147      Component Value Date/Time   CALCIUM 9.2 10/06/2017 1429   CALCIUM 9.5 03/25/2017 1333   CALCIUM 9.2 11/25/2016 1147   ALKPHOS 109 (H) 10/06/2017 1429   ALKPHOS 111 03/25/2017 1333   ALKPHOS 99 11/25/2016 1147   AST 18 10/06/2017 1429   AST 24 11/25/2016 1147   ALT 18 10/06/2017 1429   ALT 30 11/25/2016 1147   BILITOT 0.6 10/06/2017 1429   BILITOT 0.61 11/25/2016 1147      Impression and Plan: Ms. Jablon is a very pleasant 72 yo caucasian female with MGUS and history of iron deficiency.  She has an IgM MGUS.  This we will have to watch closely.  We have been watching her for several  years.  So far, we have not seen anything that shows progression to a lymphoplasmacytic lymphoma or myeloma.  We will go ahead and plan to get her back in 6 months.  It is always nice to talk with her.  [name], MD 1/17/20194:47 PM

## 2017-10-07 ENCOUNTER — Encounter: Payer: Self-pay | Admitting: Family Medicine

## 2017-10-07 LAB — IRON AND TIBC
IRON: 75 ug/dL (ref 41–142)
SATURATION RATIOS: 26 % (ref 21–57)
TIBC: 289 ug/dL (ref 236–444)
UIBC: 214 ug/dL

## 2017-10-07 LAB — FERRITIN: FERRITIN: 134 ng/mL (ref 9–269)

## 2017-10-10 LAB — PROTEIN ELECTROPHORESIS, SERUM
A/G Ratio: 1.1 (ref 0.7–1.7)
ALBUMIN ELP: 3.7 g/dL (ref 2.9–4.4)
Alpha-1-Globulin: 0.2 g/dL (ref 0.0–0.4)
Alpha-2-Globulin: 0.8 g/dL (ref 0.4–1.0)
Beta Globulin: 1.1 g/dL (ref 0.7–1.3)
Gamma Globulin: 1.1 g/dL (ref 0.4–1.8)
Globulin, Total: 3.3 g/dL (ref 2.2–3.9)
M-Spike, %: 0.6 g/dL — ABNORMAL HIGH
TOTAL PROTEIN ELP: 7 g/dL (ref 6.0–8.5)

## 2017-10-13 IMAGING — MR MR LUMBAR SPINE WO/W CM
5 of 7 series · 27 of 48 positions shown · IV contrast (20 ML MULTIHANCE)
Comparison: CT lumbar spine 12/06/2015.  Lumbar   MRI 10/08/2014

CLINICAL DATA: Low back pain with bilateral leg pain and numbness.
Lumbar fusion

EXAM:
MRI LUMBAR SPINE WITHOUT AND WITH CONTRAST
TECHNIQUE: Multiplanar and multiecho pulse sequences of the lumbar spine were
obtained without and with intravenous contrast.
CONTRAST:  15 mL MultiHance IV

[Series 3: T1 · sagittal · 4.0mm · 0.51mm/px · 5 of 14 slices shown (1 of 2)]
[im 1/14]
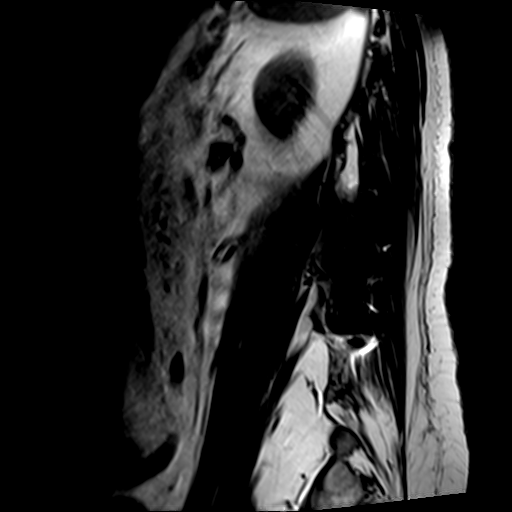
[im 4/14]
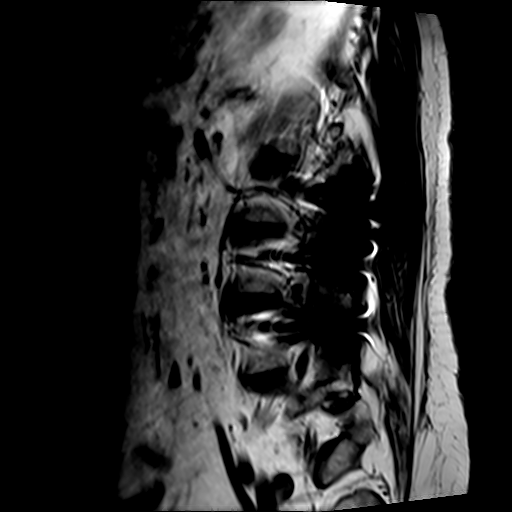
[im 7/14]
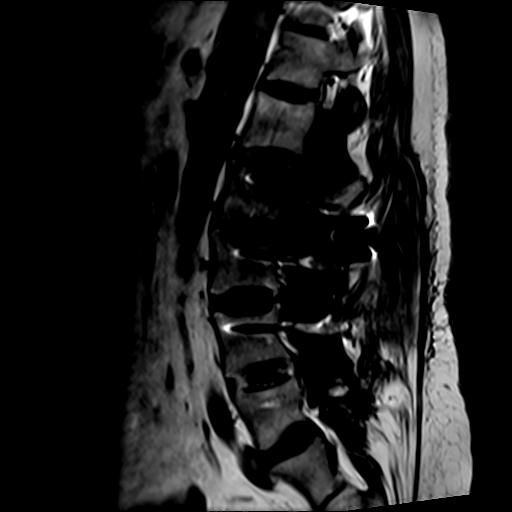
[im 10/14]
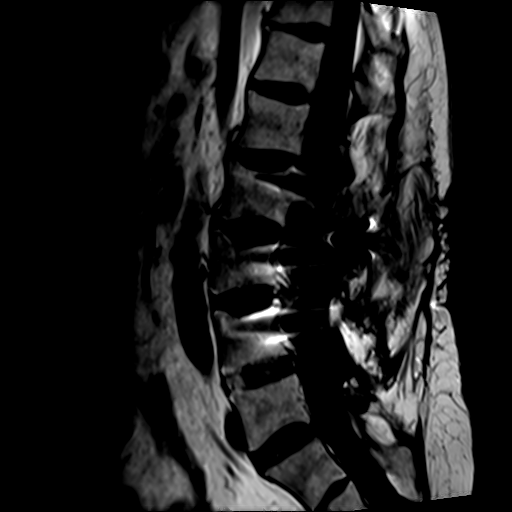
[im 14/14]
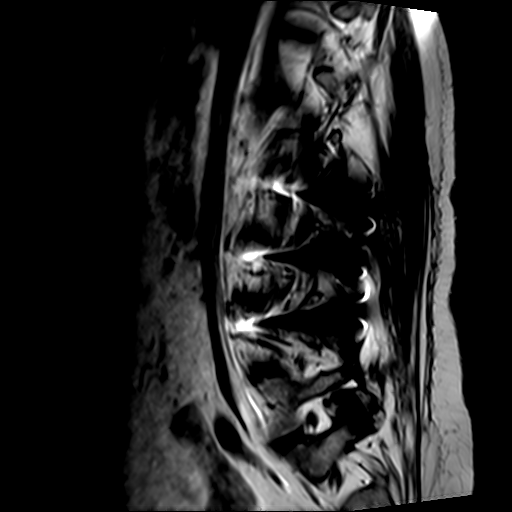

[Series 4: T2 · axial · 4.0mm · 0.39mm/px · z∈[-123,+70]mm · 8 of 35 slices shown (1 of 2)]
[im 1/35]
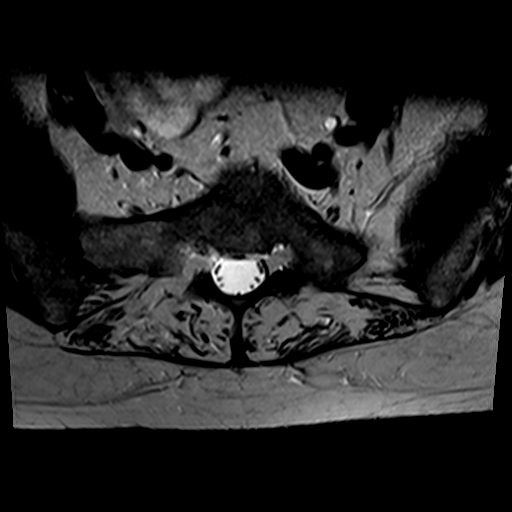
[im 4/35]
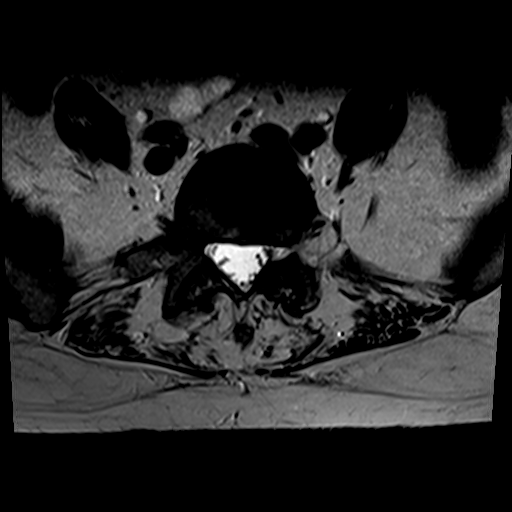
[im 12/35]
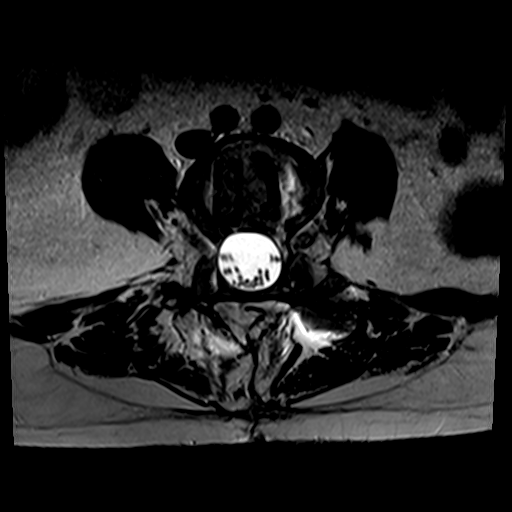
[im 16/35]
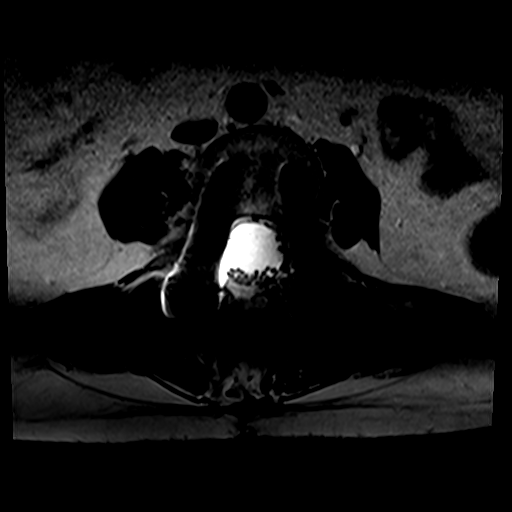
[im 19/35]
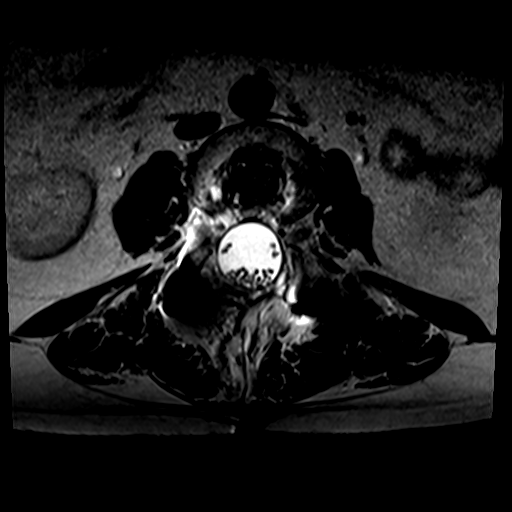
[im 23/35]
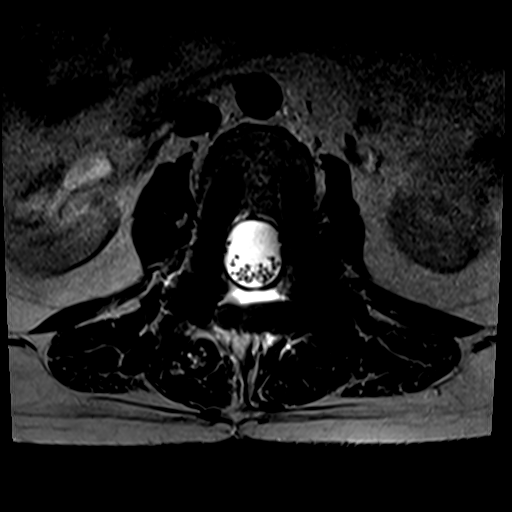
[im 31/35]
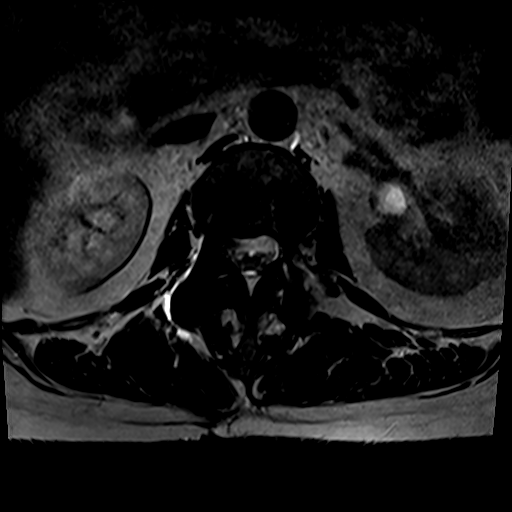
[im 35/35]
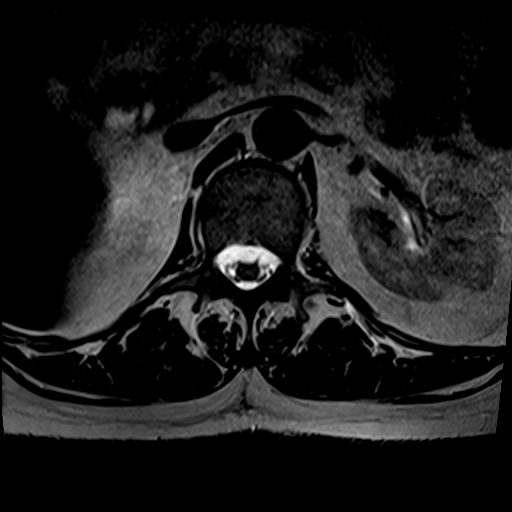

[Series 5: T1 · axial · 4.0mm · 0.78mm/px · z∈[-123,+70]mm · 8 of 35 slices shown (2 of 2)]
[im 1/35]
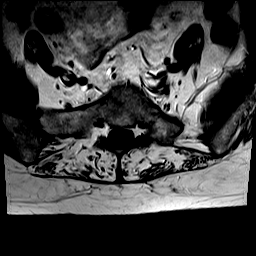
[im 4/35]
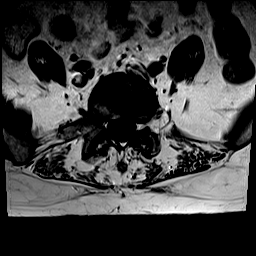
[im 12/35]
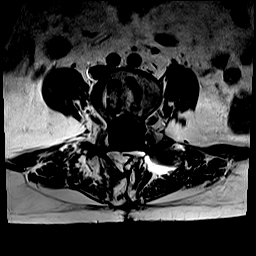
[im 16/35]
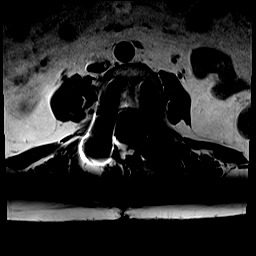
[im 19/35]
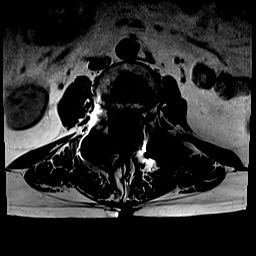
[im 23/35]
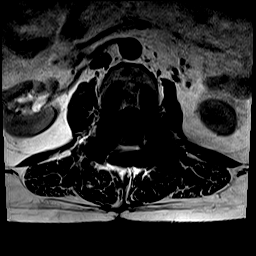
[im 31/35]
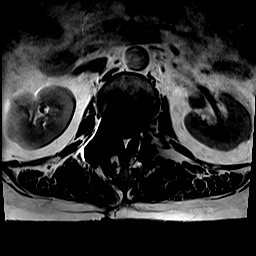
[im 35/35]
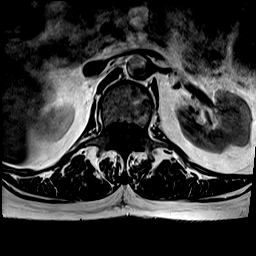

[Series 6: T2 · sagittal · 4.0mm · 0.81mm/px · 4 of 14 slices shown (2 of 2)]
[im 1/14]
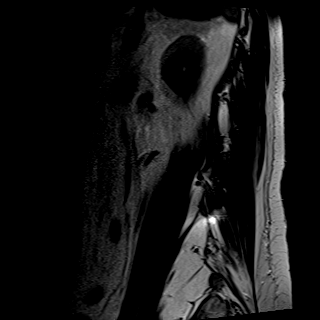
[im 5/14]
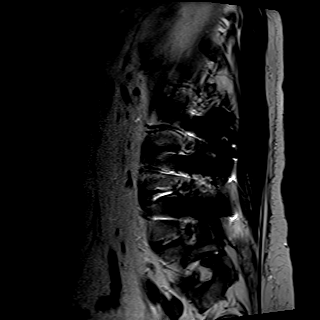
[im 9/14]
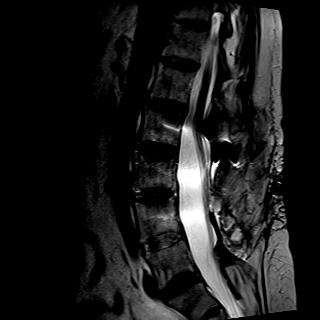
[im 14/14]
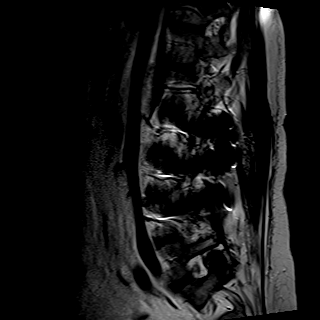

[Series 7: T1 fat-sat post-contrast · sagittal · 4.0mm · 0.51mm/px · 2 of 14 slices shown]
[im 1/14]
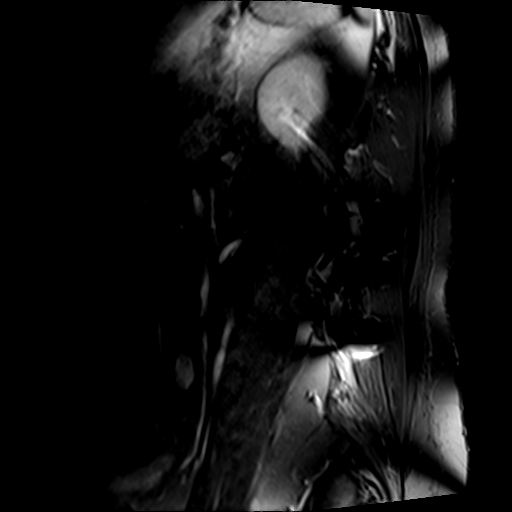
[im 5/14]
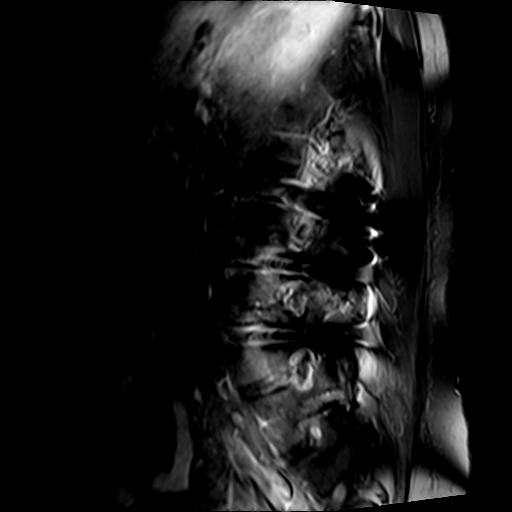

[27 of 48 positions shown; findings below may reference images not displayed]

FINDINGS: Slight retrolisthesis L1-2. Negative for fracture or mass lesion. No
enhancing lesions postcontrast. No fluid collection. Conus
medullaris normal and terminates at L2-3 unchanged from the prior
study. This is the lower limit of normal.

L1-2: Small right-sided disc protrusion. Extruded disc fragment
extending caudally behind the L2 vertebral body on the right. This
indents the thecal sac but is not causing nerve root compression or
significant spinal stenosis. This was not present on the prior MRI.
Mild facet degeneration bilaterally

L2-3: Pedicle screw and interbody fusion with posterior
decompression. Negative for stenosis

L3-4: Pedicle screw and interbody fusion. Posterior decompression
without spinal stenosis

L4-5: Pedicle screws have been removed from L5. Solid interbody
fusion. Posterior decompression without spinal stenosis

L5-S1: Bilateral facet degeneration left greater than right. Mild
left foraminal narrowing due to bony hypertrophy.
IMPRESSION: Small extruded disc fragment on the right at L1-2 with downgoing
disc material. This was not present on the prior MRI of 10/08/2014.
No significant nerve root compression is identified.

Lumbar fusion L2 through L5 without stenosis.

Mild left foraminal narrowing due to facet hypertrophy.

## 2017-10-13 IMAGING — CT CT L SPINE W/O CM
3 series · 13 of 35 positions shown, 16 images · non-contrast
Comparison: Lumbar MRI from today.  Lumbar MRI 10/08/2014

CLINICAL DATA: Back pain with right leg pain. History lumbar
fusion.

EXAM:
CT LUMBAR SPINE WITHOUT CONTRAST
TECHNIQUE: Multidetector CT imaging of the lumbar spine was performed without
intravenous contrast administration. Multiplanar CT image
reconstructions were also generated.

[Series 4: l spine soft · axial · 0.37mm/px · z∈[-398,-170]mm · 5 of 166 slices shown, 7 images]
[im 26/166  soft-tissue]
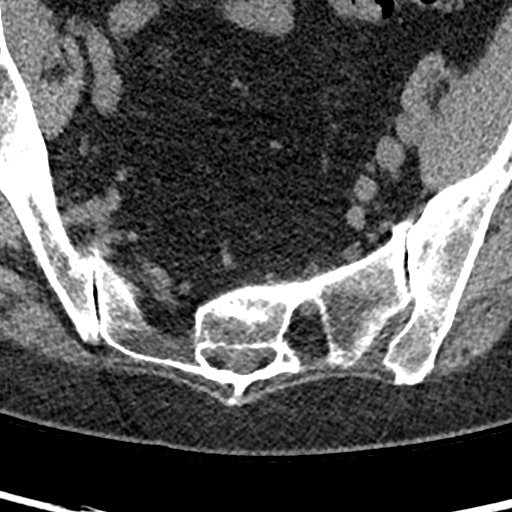
[im 26/166  bone]
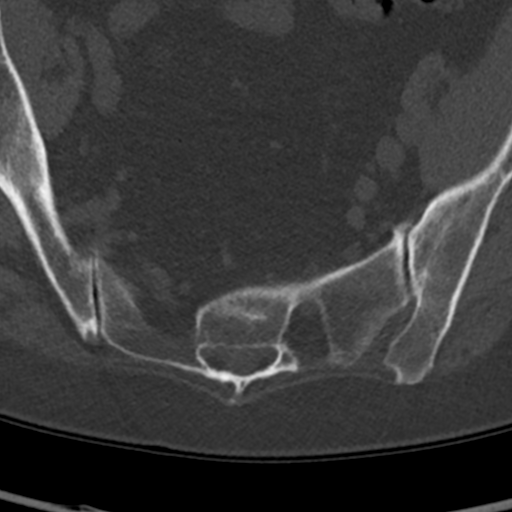
[im 51/166  bone]
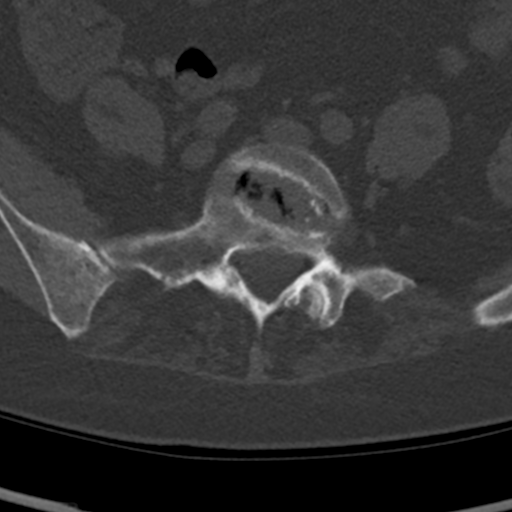
[im 89/166  bone]
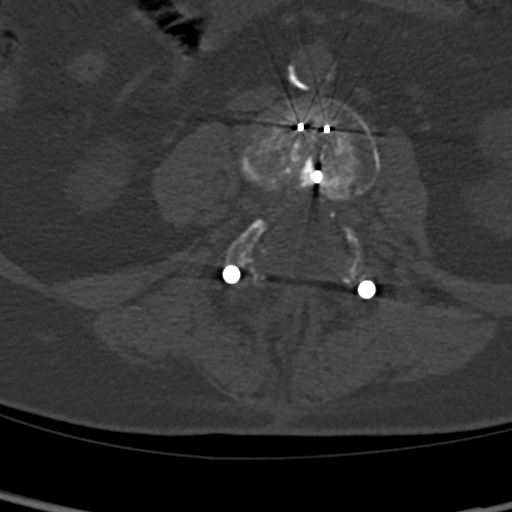
[im 115/166  bone]
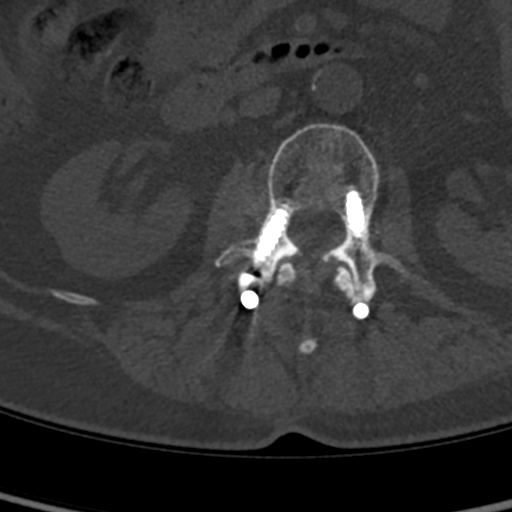
[im 140/166  soft-tissue]
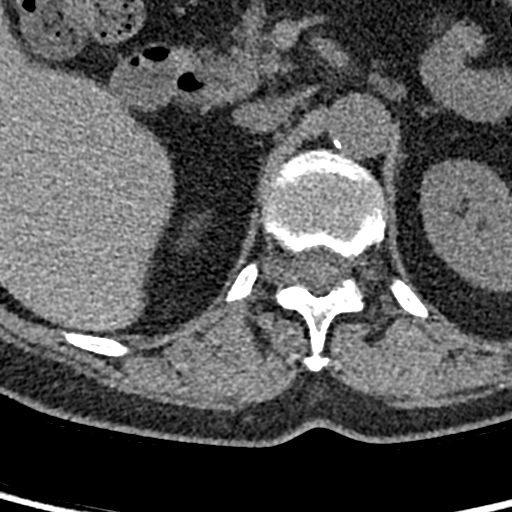
[im 140/166  bone]
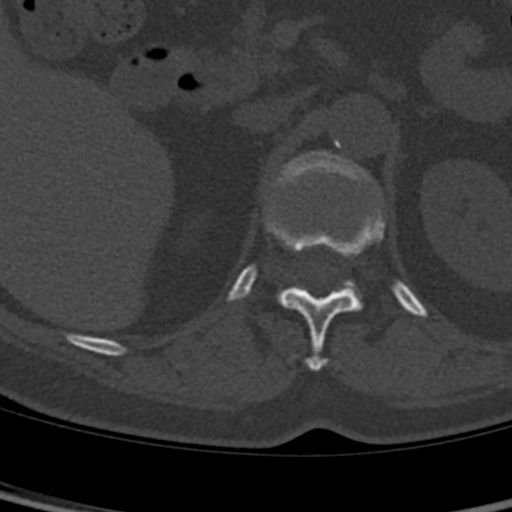

[Series 7: sagittal bone · sagittal · 0.48mm/px · 5 of 93 slices shown, 6 images]
[im 31/93  bone]
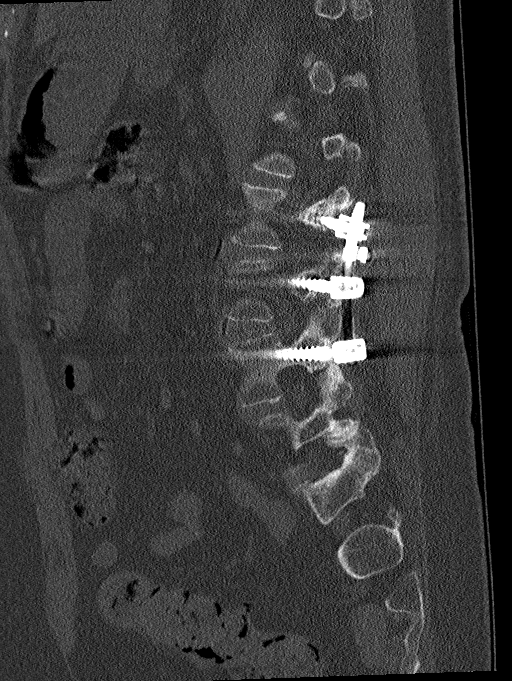
[im 39/93  bone]
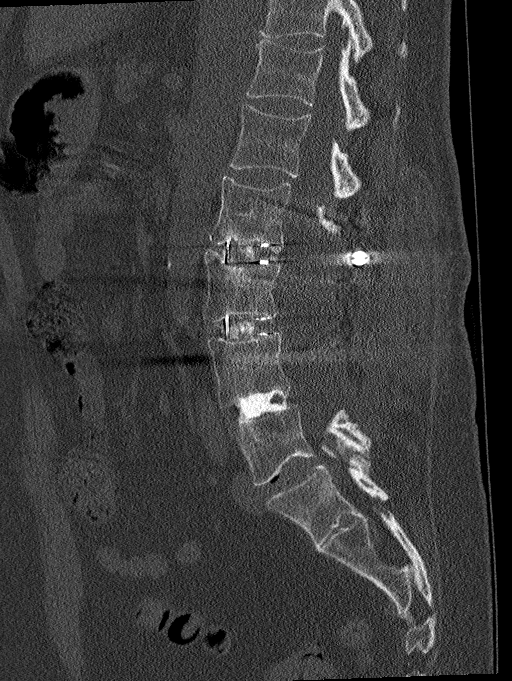
[im 47/93  soft-tissue]
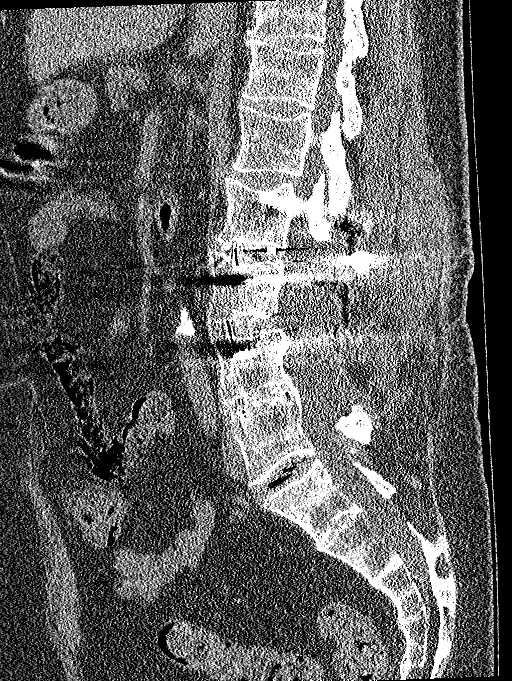
[im 47/93  bone]
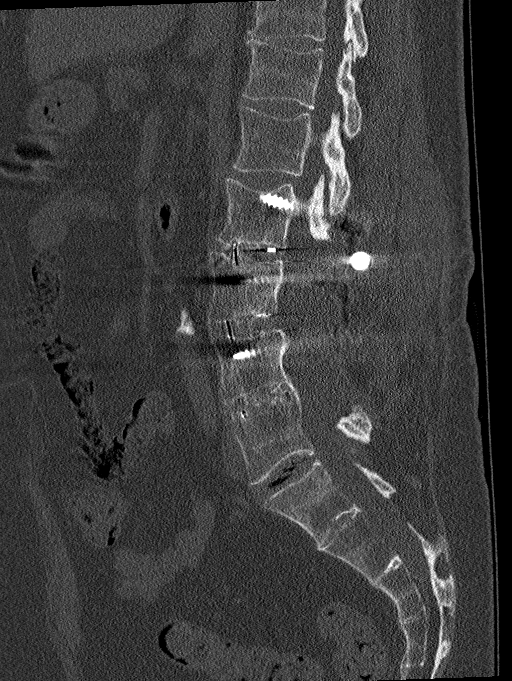
[im 54/93  bone]
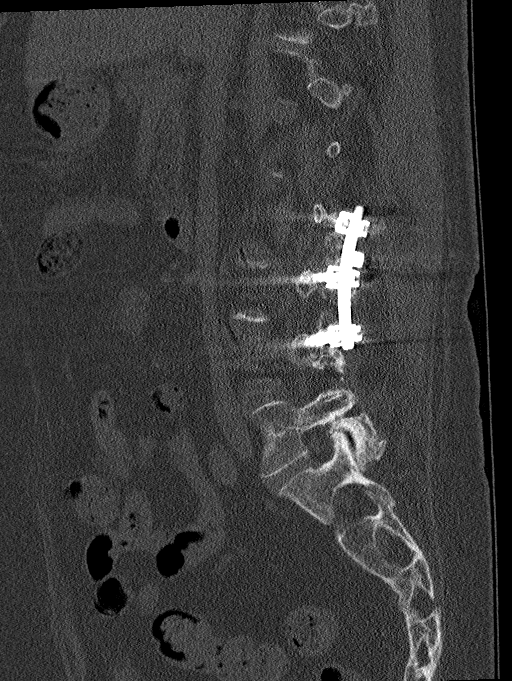
[im 62/93  bone]
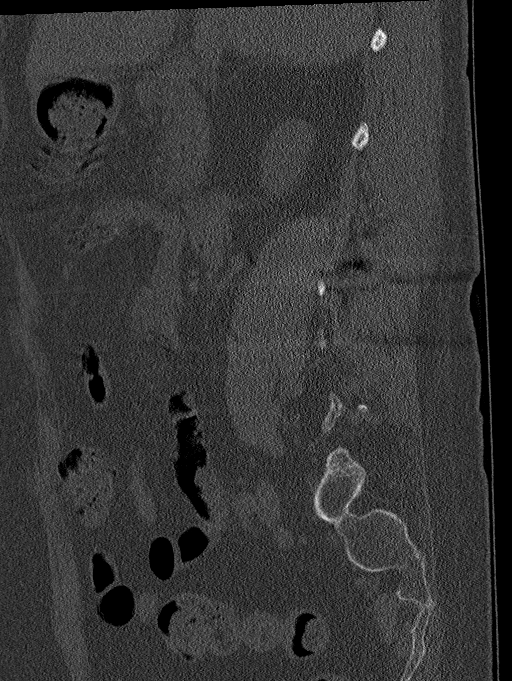

[Series 8: coronal bone · coronal · 0.47mm/px · 3 of 70 slices shown]
[im 14/70  bone]
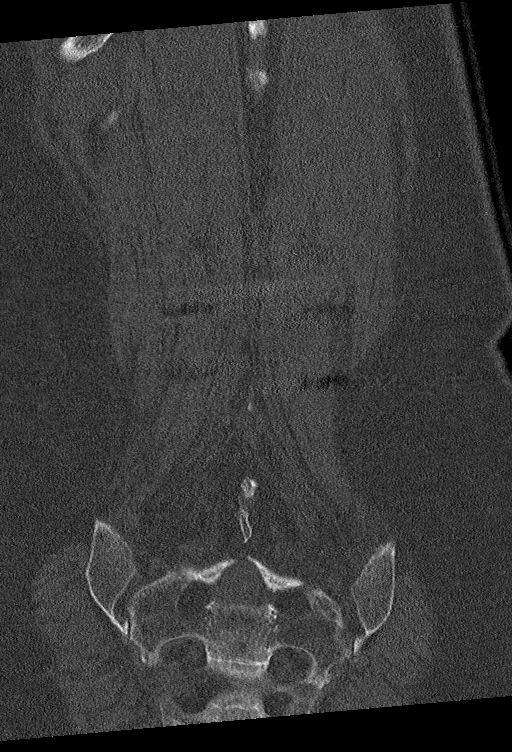
[im 28/70  bone]
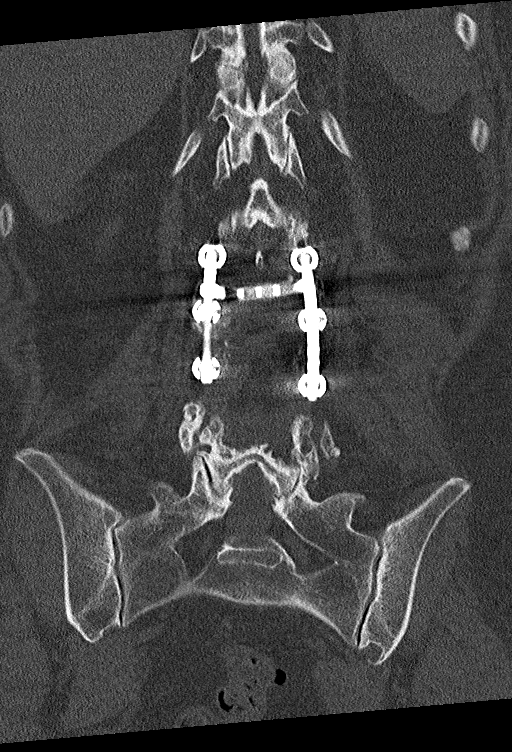
[im 42/70  bone]
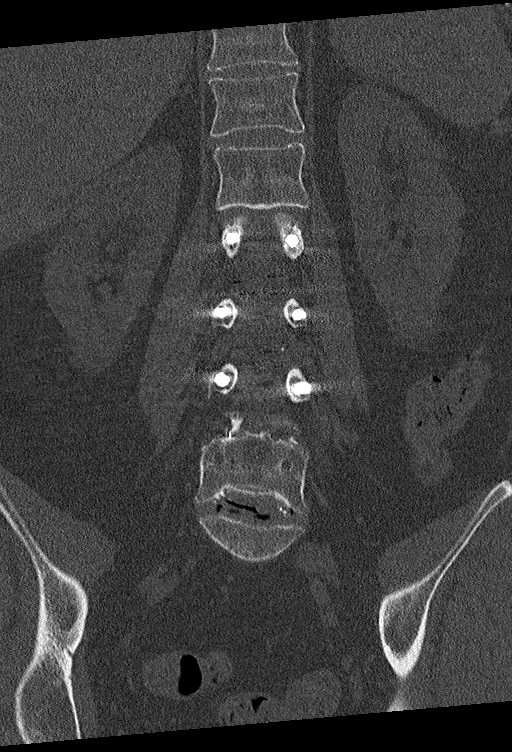

[13 of 35 positions shown; findings below may reference images not displayed]

FINDINGS: Slight retrolisthesis L1-2. Negative for fracture or mass lesion.
Total laminectomy of L3 and L4 with partial laminectomy L2 and L5.
Pedicle screw and posterior rod fusion L2-3 and L3-4. Solid
interbody bony fusion L2-3 and L3-4. Solid interbody bone fusion at
L4-5. Pedicle screws at L5 have been removed. No fusion at L5-S1.
Mild levoscoliosis.

T12-L1:  Mild facet degeneration without spinal stenosis

L1-2: Mild retrolisthesis. Mild facet degeneration. Small
right-sided disc protrusion, better seen on MRI. Based on the MRI,
this may be a small extruded disc fragment extending caudally. No
significant spinal stenosis. Mild facet degeneration.

L2-3: Bilateral pedicle screws in good position. Solid interbody
fusion. Posterior laminectomy. No spinal stenosis.

L3-4: Pedicle screw position is satisfactory. Solid interbody
fusion. Posterior laminectomy. No significant stenosis.

L4-5: Solid interbody fusion. Posterior laminectomy without spinal
stenosis.

L5-S1: Disc and facet degeneration. Moderate left foraminal
narrowing due to facet hypertrophy.
IMPRESSION: Small extruded disc fragment on the right at L1-2 is better seen on
MRI performed today. See separate MRI report.

Solid fusion L2 through L5 with posterior laminectomy. No hardware
complication and no spinal stenosis

L5-S1 facet hypertrophy on the left causing left foraminal
encroachment.

## 2017-10-14 LAB — IGG, IGA, IGM
IGG (IMMUNOGLOBIN G), SERUM: 639 mg/dL — AB (ref 700–1600)
IgA: 244 mg/dL (ref 87–352)
IgM (Immunoglobulin M), Srm: 559 mg/dL — ABNORMAL HIGH (ref 26–217)

## 2017-10-14 LAB — KAPPA/LAMBDA LIGHT CHAINS
KAPPA FREE LGHT CHN: 12.5 mg/L (ref 3.3–19.4)
KAPPA, LAMDA LIGHT CHAIN RATIO: 0.67 (ref 0.26–1.65)
Lambda free light chains: 18.6 mg/L (ref 5.7–26.3)

## 2017-10-17 ENCOUNTER — Telehealth: Payer: Self-pay | Admitting: *Deleted

## 2017-10-17 NOTE — Telephone Encounter (Addendum)
Patient is aware of results  ----- Message from [name], MD sent at 10/14/2017  3:25 PM EST ----- Call - protein level is stable!!  Laurey Arrow

## 2017-10-18 DIAGNOSIS — M7062 Trochanteric bursitis, left hip: Secondary | ICD-10-CM | POA: Diagnosis not present

## 2017-10-18 DIAGNOSIS — M7061 Trochanteric bursitis, right hip: Secondary | ICD-10-CM | POA: Diagnosis not present

## 2017-11-02 DIAGNOSIS — F411 Generalized anxiety disorder: Secondary | ICD-10-CM | POA: Diagnosis not present

## 2017-11-04 DIAGNOSIS — M961 Postlaminectomy syndrome, not elsewhere classified: Secondary | ICD-10-CM | POA: Diagnosis not present

## 2017-11-04 DIAGNOSIS — M62830 Muscle spasm of back: Secondary | ICD-10-CM | POA: Diagnosis not present

## 2017-11-04 DIAGNOSIS — K5900 Constipation, unspecified: Secondary | ICD-10-CM | POA: Diagnosis not present

## 2017-11-04 DIAGNOSIS — G894 Chronic pain syndrome: Secondary | ICD-10-CM | POA: Diagnosis not present

## 2017-11-15 DIAGNOSIS — F411 Generalized anxiety disorder: Secondary | ICD-10-CM | POA: Diagnosis not present

## 2017-12-02 DIAGNOSIS — M961 Postlaminectomy syndrome, not elsewhere classified: Secondary | ICD-10-CM | POA: Diagnosis not present

## 2017-12-02 DIAGNOSIS — K5900 Constipation, unspecified: Secondary | ICD-10-CM | POA: Diagnosis not present

## 2017-12-02 DIAGNOSIS — G894 Chronic pain syndrome: Secondary | ICD-10-CM | POA: Diagnosis not present

## 2017-12-02 DIAGNOSIS — Z79891 Long term (current) use of opiate analgesic: Secondary | ICD-10-CM | POA: Diagnosis not present

## 2017-12-02 DIAGNOSIS — M62830 Muscle spasm of back: Secondary | ICD-10-CM | POA: Diagnosis not present

## 2017-12-19 DIAGNOSIS — F411 Generalized anxiety disorder: Secondary | ICD-10-CM | POA: Diagnosis not present

## 2017-12-20 DIAGNOSIS — M7061 Trochanteric bursitis, right hip: Secondary | ICD-10-CM | POA: Diagnosis not present

## 2017-12-20 DIAGNOSIS — M1712 Unilateral primary osteoarthritis, left knee: Secondary | ICD-10-CM | POA: Diagnosis not present

## 2017-12-20 DIAGNOSIS — M47896 Other spondylosis, lumbar region: Secondary | ICD-10-CM | POA: Diagnosis not present

## 2017-12-20 DIAGNOSIS — M7062 Trochanteric bursitis, left hip: Secondary | ICD-10-CM | POA: Diagnosis not present

## 2017-12-26 ENCOUNTER — Telehealth: Payer: Self-pay | Admitting: Family Medicine

## 2017-12-26 DIAGNOSIS — M47896 Other spondylosis, lumbar region: Secondary | ICD-10-CM | POA: Diagnosis not present

## 2017-12-26 DIAGNOSIS — M7061 Trochanteric bursitis, right hip: Secondary | ICD-10-CM | POA: Diagnosis not present

## 2017-12-26 DIAGNOSIS — M1712 Unilateral primary osteoarthritis, left knee: Secondary | ICD-10-CM | POA: Diagnosis not present

## 2017-12-26 DIAGNOSIS — M7062 Trochanteric bursitis, left hip: Secondary | ICD-10-CM | POA: Diagnosis not present

## 2017-12-26 MED ORDER — AMOXICILLIN 500 MG PO CAPS: ORAL_CAPSULE | ORAL | 2 refills | Status: DC

## 2017-12-26 NOTE — Telephone Encounter (Signed)
Copied from [place]. Topic: Quick Communication - See Telephone Encounter >> Dec 26, 2017 12:09 PM Conception Chancy, NT wrote: CRM for notification. See Telephone encounter for: 12/26/17.  Patient is calling and states since she had knee replacement surgery she states she has to take Amoxacillin anytime she goes to the dentist. Patient states she has a appt with the dentist on 12/29/17 and is needing it called in before hand. Please advise.  [id number] - [place], [address] [place] [address] Phone: [phone] Fax: [phone]

## 2017-12-26 NOTE — Telephone Encounter (Signed)
OK, amoxil eRx'd.

## 2017-12-26 NOTE — Telephone Encounter (Signed)
Please advise. Thanks.  

## 2017-12-26 NOTE — Telephone Encounter (Signed)
Pt advised and voiced understanding.   

## 2017-12-28 DIAGNOSIS — F411 Generalized anxiety disorder: Secondary | ICD-10-CM | POA: Diagnosis not present

## 2017-12-30 DIAGNOSIS — M7061 Trochanteric bursitis, right hip: Secondary | ICD-10-CM | POA: Diagnosis not present

## 2017-12-30 DIAGNOSIS — M47896 Other spondylosis, lumbar region: Secondary | ICD-10-CM | POA: Diagnosis not present

## 2017-12-30 DIAGNOSIS — M7062 Trochanteric bursitis, left hip: Secondary | ICD-10-CM | POA: Diagnosis not present

## 2017-12-30 DIAGNOSIS — M1712 Unilateral primary osteoarthritis, left knee: Secondary | ICD-10-CM | POA: Diagnosis not present

## 2018-01-02 DIAGNOSIS — M961 Postlaminectomy syndrome, not elsewhere classified: Secondary | ICD-10-CM | POA: Diagnosis not present

## 2018-01-02 DIAGNOSIS — G894 Chronic pain syndrome: Secondary | ICD-10-CM | POA: Diagnosis not present

## 2018-01-02 DIAGNOSIS — M62830 Muscle spasm of back: Secondary | ICD-10-CM | POA: Diagnosis not present

## 2018-01-02 DIAGNOSIS — K5900 Constipation, unspecified: Secondary | ICD-10-CM | POA: Diagnosis not present

## 2018-01-03 DIAGNOSIS — M1712 Unilateral primary osteoarthritis, left knee: Secondary | ICD-10-CM | POA: Diagnosis not present

## 2018-01-03 DIAGNOSIS — M7062 Trochanteric bursitis, left hip: Secondary | ICD-10-CM | POA: Diagnosis not present

## 2018-01-03 DIAGNOSIS — M47896 Other spondylosis, lumbar region: Secondary | ICD-10-CM | POA: Diagnosis not present

## 2018-01-03 DIAGNOSIS — M7061 Trochanteric bursitis, right hip: Secondary | ICD-10-CM | POA: Diagnosis not present

## 2018-01-04 DIAGNOSIS — F411 Generalized anxiety disorder: Secondary | ICD-10-CM | POA: Diagnosis not present

## 2018-01-05 DIAGNOSIS — M7061 Trochanteric bursitis, right hip: Secondary | ICD-10-CM | POA: Diagnosis not present

## 2018-01-05 DIAGNOSIS — M47896 Other spondylosis, lumbar region: Secondary | ICD-10-CM | POA: Diagnosis not present

## 2018-01-05 DIAGNOSIS — M1712 Unilateral primary osteoarthritis, left knee: Secondary | ICD-10-CM | POA: Diagnosis not present

## 2018-01-05 DIAGNOSIS — M7062 Trochanteric bursitis, left hip: Secondary | ICD-10-CM | POA: Diagnosis not present

## 2018-01-13 DIAGNOSIS — M7061 Trochanteric bursitis, right hip: Secondary | ICD-10-CM | POA: Diagnosis not present

## 2018-01-13 DIAGNOSIS — M7062 Trochanteric bursitis, left hip: Secondary | ICD-10-CM | POA: Diagnosis not present

## 2018-01-13 DIAGNOSIS — M1712 Unilateral primary osteoarthritis, left knee: Secondary | ICD-10-CM | POA: Diagnosis not present

## 2018-01-13 DIAGNOSIS — M47896 Other spondylosis, lumbar region: Secondary | ICD-10-CM | POA: Diagnosis not present

## 2018-01-17 DIAGNOSIS — M1712 Unilateral primary osteoarthritis, left knee: Secondary | ICD-10-CM | POA: Diagnosis not present

## 2018-01-17 DIAGNOSIS — M47896 Other spondylosis, lumbar region: Secondary | ICD-10-CM | POA: Diagnosis not present

## 2018-01-17 DIAGNOSIS — M7061 Trochanteric bursitis, right hip: Secondary | ICD-10-CM | POA: Diagnosis not present

## 2018-01-17 DIAGNOSIS — M7062 Trochanteric bursitis, left hip: Secondary | ICD-10-CM | POA: Diagnosis not present

## 2018-01-20 DIAGNOSIS — M7062 Trochanteric bursitis, left hip: Secondary | ICD-10-CM | POA: Diagnosis not present

## 2018-01-20 DIAGNOSIS — M47896 Other spondylosis, lumbar region: Secondary | ICD-10-CM | POA: Diagnosis not present

## 2018-01-20 DIAGNOSIS — M1712 Unilateral primary osteoarthritis, left knee: Secondary | ICD-10-CM | POA: Diagnosis not present

## 2018-01-20 DIAGNOSIS — M7061 Trochanteric bursitis, right hip: Secondary | ICD-10-CM | POA: Diagnosis not present

## 2018-01-23 ENCOUNTER — Ambulatory Visit (INDEPENDENT_AMBULATORY_CARE_PROVIDER_SITE_OTHER): Payer: Medicare Other | Admitting: Family Medicine

## 2018-01-23 ENCOUNTER — Encounter: Payer: Self-pay | Admitting: Family Medicine

## 2018-01-23 VITALS — BP 110/74 | HR 55 | Temp 98.1°F | Resp 16 | Ht 66.0 in | Wt 176.4 lb

## 2018-01-23 DIAGNOSIS — E039 Hypothyroidism, unspecified: Secondary | ICD-10-CM | POA: Diagnosis not present

## 2018-01-23 DIAGNOSIS — E663 Overweight: Secondary | ICD-10-CM | POA: Diagnosis not present

## 2018-01-23 DIAGNOSIS — E559 Vitamin D deficiency, unspecified: Secondary | ICD-10-CM | POA: Diagnosis not present

## 2018-01-23 NOTE — Progress Notes (Signed)
OFFICE VISIT  01/23/2018   CC:  Chief Complaint  Patient presents with  . Follow-up    RCI, pt is not fasting.     HPI:    Patient is a 72 y.o. Caucasian female who presents for 6 mo f/u fibromyalgia, HTN, HLD, chronic anxiety/depression.  Anxiety/fibromyalgia:  I use alprazolam for tx for her for both of these problems: she is seeing a counselor named Amada Jupiter.  Has lots of chronic pain and chronic fatigue.  She takes this med 3 times per day most days.  She stopped her statin b/c she wanted to see if this was making her pain worse.  She notices no signif difference in her pain or fatigue of the med. She does not want to restart the statin.  She wants to try Red Yeast rice and then check cholesterol levels and if no improvement then she will likely agree to going back on statin.  She sees a pain mgmt MD for chronic LBP, DDD.  Has seen [name] recently and surgery was not recommended.  PT being done.  She did 12 treatments of "[employer]" laser therapy.  This helped temporarily.  HTN: bp consistently <130/80.  Compliant with med. No exercise due to her chronic debilitating pain.  HLD: see info on her statin above.  Hypothyroidism: takes levothyroxine daily on empty stomach w/out PPI, iron, or vitamins.  Past Medical History:  Diagnosis Date  . Allergic rhinitis   . Anxiety   . Chest pain    cr  . Chronic gastritis    dx'd by EGD 2018 (erosive, likely NSAID-induced).  . Chronic pain syndrome    right knee osteo, hx of TKA in R knee.  Pt was told by [name] that no further surgical options are available for her--[name]  . Chronic renal insufficiency, stage II (mild) 2015   vs acute fluctuations depending on hydration?---GFR from 60s up to 80s.  . Diverticulosis of colon    with hx of 'itis  . DJD (degenerative joint disease)    L spine and knees  . Epigastric pain    As of 01/2017, GI (Dr. Loletha Carrow) planned EGD.  [name] FATIGUE / MALAISE 07/01/2009   Chronic  . Fibromyalgia   . GERD (gastroesophageal reflux disease)   . History of colonic polyps   . HTN (hypertension)   . Hypercholesterolemia   . Hypothyroid 07/05/2012  . IBS (irritable bowel syndrome)   . IC (interstitial cystitis)    [name] at [facility]  . Iron deficiency anemia, unspecified 06/27/2013   Has required IV iron on multiple occasions ([name]).  Hemoccults neg 12/2016.  . Lumbar back pain   . Major depression, recurrent ([place])    vs dysthymia,chronic  . Memory impairment 10/03/2012   Memory impairment 10/03/2012 >referral to neuro    . Monoclonal gammopathy of undetermined significance    IgM kappa MGUS ([name]) routine f/u has shown stability of this problem.  . OSA (obstructive sleep apnea)    Latest sleep study 01/2017.  Some adjustments to CPAP being made+ dental referral for consideration of oral appliance --per pulm MD.  . Osteopenia 04/2014   T score -2.0 FRAX 11%/1.8%  . Otitis externa, candida 16/1096   [name] tx'd her with clotrimazole 1% external solution x 1 mo  . Palpitations   . Psoriasis   . Vitamin D deficiency   . Xerostomia    and xerophthalmia--per [name] (he suggests SSA, SSB, ESR,  rh factor, ANA, and antimicrosmal and antithyroglobulin ab's as of 02/17/16--all these labs were NORMAL.    Past Surgical History:  Procedure Laterality Date  . ABDOMINAL HYSTERECTOMY     for prolapse with abdominal incision  . BACK SURGERY  [id number]   L2-3 fusion ([name])  . CARDIAC CATHETERIZATION     no PCI  . CHOLECYSTECTOMY    . COLONOSCOPY  04/2006   Diverticulosis, o/w normal.  . ESOPHAGOGASTRODUODENOSCOPY  05/29/2009; 03/01/17   Esoph normal.  Gastritis (H pylori NEG) + gastric polyps (benign).  Duodenal biopsy NORMAL.  2018-mild chronic gastritis, H pylori NEG.  . EYE SURGERY Left    "lazy eye"  . HAND SURGERY Right    TRIGGER FINGER; index finger  . HERNIA REPAIR     umbilical  . KNEE ARTHROSCOPY     RIGHT KNEE X 5  .  LAMINOTOMY  2010   L-4, L-5 FUSION  . NASAL SINUS SURGERY     polypectomy (remote past)  . OOPHORECTOMY  2010   Laparoscopic BSO (path benign)  . PATELLA FRACTURE SURGERY  10-08   [name]  . POSTERIOR LAMINECTOMY / DECOMPRESSION LUMBAR SPINE  9-09   [name]  . TOTAL KNEE ARTHROPLASTY  12-07 and 4-07   [name], revision 12-09 [name] at [facility]    Outpatient Medications Prior to Visit  Medication Sig Dispense Refill  . acetaminophen (TYLENOL) 325 MG tablet Take 650 mg by mouth every 6 (six) hours as needed.    . ALPRAZolam (XANAX) 0.5 MG tablet TAKE ONE-HALF TO ONE TABLET BY MOUTH THREE TIMES DAILY 270 tablet 1  . Azelastine HCl (ASTEPRO) 0.15 % SOLN 2 sprays each nostril every 12 hours (Patient taking differently: Place 2 sprays into the nose 2 (two) times daily as needed (Allergies). 2 sprays each nostril every 12 hours) 30 mL 11  . calcium carbonate (OSCAL) 1500 (600 Ca) MG TABS tablet Take by mouth daily.    . Cholecalciferol (VITAMIN D) 2000 UNITS CAPS Take 5 capsules by mouth daily.     . cyclobenzaprine (FLEXERIL) 5 MG tablet     . dicyclomine (BENTYL) 20 MG tablet Take 1 tablet (20 mg total) by mouth 2 (two) times daily. 60 tablet 3  . diphenhydrAMINE (BENADRYL) 25 MG tablet Take 25 mg by mouth at bedtime as needed.    . hydrocortisone 2.5 % cream Apply 1 application topically as needed. RECTAL ITCHING    . Inositol Niacinate 500 MG CAPS Take 1 capsule by mouth at bedtime.    [name] levothyroxine (SYNTHROID, LEVOTHROID) 75 MCG tablet TAKE 1/2 (ONE-HALF) TABLET BY MOUTH ONCE DAILY 45 tablet 3  . meclizine (ANTIVERT) 25 MG tablet Take 1 tablet (25 mg total) by mouth every 6 (six) hours as needed for dizziness. 360 tablet 3  . MELATONIN PO Take by mouth.    . meloxicam (MOBIC) 7.5 MG tablet TAKE TWO TABLETS BY MOUTH ONCE DAILY 180 tablet 1  . metoprolol tartrate (LOPRESSOR) 25 MG tablet TAKE ONE TABLET BY MOUTH ONCE DAILY 90 tablet 1  . Multiple Vitamins-Iron (MULTIVITAMIN/IRON PO)  Take 1 tablet by mouth daily.     [name] omeprazole (PRILOSEC) 40 MG capsule Take 1 capsule (40 mg total) by mouth daily. 30 capsule 3  . Oxycodone HCl 10 MG TABS 10 mg every 4 (four) hours as needed. 1 every 6 hours for pain    . Probiotic Product (ALIGN) 4 MG CAPS Take 1 capsule (4 mg total) by mouth daily.  60 capsule 0  . promethazine (PHENERGAN) 25 MG tablet TAKE ONE TABLET BY MOUTH EVERY 6 HOURS AS NEEDED FOR NAUSEA AND VOMITING 90 tablet 3  . Specialty Vitamins Products (MAGNESIUM, AMINO ACID CHELATE,) 133 MG tablet Take 1 tablet by mouth daily.    [name] amoxicillin (AMOXIL) 500 MG capsule 4 tabs po x 1 dose, one hour prior to dental procedure (Patient not taking: Reported on 01/23/2018) 4 capsule 2  . traZODone (DESYREL) 50 MG tablet Take 1 tablet by mouth at bedtime.  2  . fish oil-omega-3 fatty acids 1000 MG capsule Take 2 g by mouth daily.     . simvastatin (ZOCOR) 40 MG tablet Take 1 tablet (40 mg total) by mouth at bedtime. (Patient not taking: Reported on 01/23/2018) 90 tablet 3   No facility-administered medications prior to visit.     Allergies  Allergen Reactions  . Cymbalta [Duloxetine Hcl]     Facial edema  . Iohexol      Code: HIVES, Desc: pt states her face and throat swells when administered IV contrast - KB, Onset Date: [id number]   . Mepergan [Meperidine-Promethazine]     Cardiac arrest  . Morphine Rash  . Augmentin [Amoxicillin-Pot Clavulanate] Nausea Only  . Citalopram Other (See Comments)    Jittery/heart racing   . Hctz [Hydrochlorothiazide] Other (See Comments)    Too much urinating, felt dried out, etc--NO ALLERGY  . Lexapro [Escitalopram Oxalate] Other (See Comments)    Jittery,heart racing  . Lyrica [Pregabalin] Swelling    Face, hands, fingers, ankles  . Valsartan     REACTION: pt states INTOL to Diovan    ROS As per HPI  PE: Blood pressure 110/74, pulse (!) 55, temperature 98.1 F (36.7 C), temperature source Oral, resp. rate 16, height _0  (1.676 m),  weight 176 lb 6 oz (80 kg), SpO2 93 %. Gen: Alert, well appearing.  Patient is oriented to person, place, time, and situation. AFFECT: pleasant, lucid thought and speech. CV: RRR, no m/r/g.   LUNGS: CTA bilat, nonlabored resps, good aeration in all lung fields. EXT: no clubbing, cyanosis, or edema.   LABS:  Lab Results  Component Value Date   TSH 1.06 01/20/2017   Lab Results  Component Value Date   WBC 7.1 10/06/2017   HGB 13.8 10/06/2017   HCT 41.3 10/06/2017   MCV 87.7 10/06/2017   PLT 168 10/06/2017   Lab Results  Component Value Date   CREATININE 1.00 10/06/2017   BUN 12 10/06/2017   NA 145 10/06/2017   K 3.7 10/06/2017   CL 100 10/06/2017   CO2 29 10/06/2017   Lab Results  Component Value Date   ALT 18 10/06/2017   AST 18 10/06/2017   ALKPHOS 109 (H) 10/06/2017   BILITOT 0.6 10/06/2017   Lab Results  Component Value Date   CHOL 209 (H) 09/23/2014   Lab Results  Component Value Date   HDL 36.60 (L) 09/23/2014   Lab Results  Component Value Date   LDLCALC 138 (H) 09/23/2014   Lab Results  Component Value Date   TRIG 172.0 (H) 09/23/2014   Lab Results  Component Value Date   CHOLHDL 6 09/23/2014     IMPRESSION AND PLAN:  1) Fibromyalgia: her baseline is pretty bad chronic widespread musculoskeletal pain--this is pretty stable. No change in management today.  Continue xanax 0.6m, 1 tid prn.  Also cont mobic and flexeril. No new rx's were given today. Controlled substance contract 07/22/17. UDS 05/09/17.  2) Anxiety: fairly stable.  See info on alprazolam in #1 above.  3) HTN: The current medical regimen is effective;  continue present plan and medications. BMET already ordered-future.  4) Hypercholesterolemia: will get baseline FLP at her earliest convenience---then she'll take red yeast rice and after 6-12 mo if her cholesterol is not significantly improved then she'll reconsider getting on statin again.  5) Hypothyroidism: due for TSH  check--ordered future so she can get when she comes in for fasting lipid panel.  6) Vit D def: vit D level ordered future.  7) Hx of hypomagnesemia: mag level ordered future.  An After Visit Summary was printed and given to the patient.  FOLLOW UP: 6 mo RCI  Signed:  Crissie Sickles, MD           01/23/2018

## 2018-01-24 ENCOUNTER — Other Ambulatory Visit (INDEPENDENT_AMBULATORY_CARE_PROVIDER_SITE_OTHER): Payer: Medicare Other

## 2018-01-24 DIAGNOSIS — E559 Vitamin D deficiency, unspecified: Secondary | ICD-10-CM | POA: Diagnosis not present

## 2018-01-24 DIAGNOSIS — E7800 Pure hypercholesterolemia, unspecified: Secondary | ICD-10-CM | POA: Diagnosis not present

## 2018-01-24 DIAGNOSIS — I10 Essential (primary) hypertension: Secondary | ICD-10-CM

## 2018-01-24 DIAGNOSIS — E039 Hypothyroidism, unspecified: Secondary | ICD-10-CM

## 2018-01-24 LAB — MAGNESIUM: Magnesium: 2.2 mg/dL (ref 1.5–2.5)

## 2018-01-24 LAB — BASIC METABOLIC PANEL
BUN: 14 mg/dL (ref 6–23)
CALCIUM: 9.1 mg/dL (ref 8.4–10.5)
CO2: 30 mEq/L (ref 19–32)
Chloride: 104 mEq/L (ref 96–112)
Creatinine, Ser: 0.94 mg/dL (ref 0.40–1.20)
GFR: 62.37 mL/min (ref >60.00)
GLUCOSE: 102 mg/dL — AB (ref 70–99)
Potassium: 4.3 mEq/L (ref 3.5–5.1)
SODIUM: 140 meq/L (ref 135–145)

## 2018-01-24 LAB — LIPID PANEL
CHOLESTEROL: 226 mg/dL — AB (ref 0–200)
HDL: 32 mg/dL — ABNORMAL LOW (ref >39.00)
NonHDL: 193.91
Total CHOL/HDL Ratio: 7
Triglycerides: 204 mg/dL — ABNORMAL HIGH (ref 0.0–149.0)
VLDL: 40.8 mg/dL — ABNORMAL HIGH (ref 0.0–40.0)

## 2018-01-24 LAB — TSH: TSH: 3.17 u[IU]/mL (ref 0.35–4.50)

## 2018-01-24 LAB — LDL CHOLESTEROL, DIRECT: Direct LDL: 157 mg/dL

## 2018-01-24 LAB — VITAMIN D 25 HYDROXY (VIT D DEFICIENCY, FRACTURES): VITD: 39.34 ng/mL (ref 30.00–100.00)

## 2018-01-25 ENCOUNTER — Other Ambulatory Visit: Payer: Self-pay

## 2018-01-25 DIAGNOSIS — E7800 Pure hypercholesterolemia, unspecified: Secondary | ICD-10-CM

## 2018-01-26 ENCOUNTER — Ambulatory Visit (INDEPENDENT_AMBULATORY_CARE_PROVIDER_SITE_OTHER): Payer: Medicare Other | Admitting: Sports Medicine

## 2018-01-26 VITALS — BP 118/82 | Ht 66.0 in | Wt 174.0 lb

## 2018-01-26 DIAGNOSIS — M48062 Spinal stenosis, lumbar region with neurogenic claudication: Secondary | ICD-10-CM | POA: Diagnosis not present

## 2018-01-26 DIAGNOSIS — M25579 Pain in unspecified ankle and joints of unspecified foot: Secondary | ICD-10-CM | POA: Diagnosis not present

## 2018-01-26 DIAGNOSIS — Z6829 Body mass index (BMI) 29.0-29.9, adult: Secondary | ICD-10-CM | POA: Diagnosis not present

## 2018-01-26 NOTE — Progress Notes (Signed)
   HPI  CC: would like custom orthotic inserts  Patient presents asking for custom orthotics. She has a history of chronic bilateral knee pain (R worse than L) secondary to osteoarthritis now s/p right total knee replacement x2 (2007 and 2010), as well as lower back pain with history of multiple fusions. She has attempted to manage chronic pain with pain management, laser treatments with the chiropractor, and physical therapy.  Her chiropractor and physical therapist pointed out right foot pronation and advised her to obtain orthotics. She has tried orthotics in the past which she sent back because she did not like them, and would like custom orthotics to be made to see if this helps with her chronic pain.  Surgeries: 2016 L2-L3 fusion 2010 L4-L5 fusion 2010 Right total knee replacement 2007 Right total knee replacement   CC, smoking and family hx reviewed.  ROS: Per HPI; in addition no fever, no rash, no additional weakness, no additional numbness, no additional paresthesias, and no additional falls/injury.   Objective: BP 118/82   Ht 5\' 6"  (1.676 m)   Wt 174 lb (78.9 kg)   BMI 28.08 kg/m  Gen: NAD, well groomed, a/o x3, normal affect.  CV: Well-perfused. Warm.  Resp: Non-labored.  Neuro: Sensation intact throughout. No gross coordination deficits.  Gait: +Left calcaneal valgus. +bilateral pes planus, right more significant than left. +Left transverse arch collapse. Bilateral foot pronation with Right more notable than left.  Assessment and Plan:  1. Pes planus and bilateral foot pronation: Temporary athletic sole inserts to be placed today. Plan to have the patient wear these inserts for about 1 month to ensure she tolerates inserts. After one month we can have her follow up in the office to make custom orthotic inserts.  Discussed with patient and her husband that we can see if the inserts help with her chronic pain, but this may not resolve her pain.  Everrett Coombe, MD PGY-2 [facility]  Patient seen and evaluated with the resident. I agree with the above plan of care. We will try some temporary inserts first and will consider custom orthotics at follow-up if she finds the temporary inserts to be helpful. I did explain to her that I am not sure if her chronic pain will resolve with orthotics but is certainly reasonable to try.

## 2018-01-27 ENCOUNTER — Encounter: Payer: Self-pay | Admitting: Sports Medicine

## 2018-01-27 DIAGNOSIS — M7062 Trochanteric bursitis, left hip: Secondary | ICD-10-CM | POA: Diagnosis not present

## 2018-01-27 DIAGNOSIS — M47896 Other spondylosis, lumbar region: Secondary | ICD-10-CM | POA: Diagnosis not present

## 2018-01-27 DIAGNOSIS — M1712 Unilateral primary osteoarthritis, left knee: Secondary | ICD-10-CM | POA: Diagnosis not present

## 2018-01-27 DIAGNOSIS — M7061 Trochanteric bursitis, right hip: Secondary | ICD-10-CM | POA: Diagnosis not present

## 2018-01-30 DIAGNOSIS — M7061 Trochanteric bursitis, right hip: Secondary | ICD-10-CM | POA: Diagnosis not present

## 2018-01-30 DIAGNOSIS — M25561 Pain in right knee: Secondary | ICD-10-CM | POA: Diagnosis not present

## 2018-01-30 DIAGNOSIS — M1712 Unilateral primary osteoarthritis, left knee: Secondary | ICD-10-CM | POA: Diagnosis not present

## 2018-02-01 DIAGNOSIS — M961 Postlaminectomy syndrome, not elsewhere classified: Secondary | ICD-10-CM | POA: Diagnosis not present

## 2018-02-01 DIAGNOSIS — G894 Chronic pain syndrome: Secondary | ICD-10-CM | POA: Diagnosis not present

## 2018-02-01 DIAGNOSIS — K5900 Constipation, unspecified: Secondary | ICD-10-CM | POA: Diagnosis not present

## 2018-02-01 DIAGNOSIS — M62830 Muscle spasm of back: Secondary | ICD-10-CM | POA: Diagnosis not present

## 2018-02-03 DIAGNOSIS — M7061 Trochanteric bursitis, right hip: Secondary | ICD-10-CM | POA: Diagnosis not present

## 2018-02-03 DIAGNOSIS — M1712 Unilateral primary osteoarthritis, left knee: Secondary | ICD-10-CM | POA: Diagnosis not present

## 2018-02-03 DIAGNOSIS — M47896 Other spondylosis, lumbar region: Secondary | ICD-10-CM | POA: Diagnosis not present

## 2018-02-03 DIAGNOSIS — M7062 Trochanteric bursitis, left hip: Secondary | ICD-10-CM | POA: Diagnosis not present

## 2018-02-07 DIAGNOSIS — M1712 Unilateral primary osteoarthritis, left knee: Secondary | ICD-10-CM | POA: Diagnosis not present

## 2018-02-07 DIAGNOSIS — M7062 Trochanteric bursitis, left hip: Secondary | ICD-10-CM | POA: Diagnosis not present

## 2018-02-07 DIAGNOSIS — M7061 Trochanteric bursitis, right hip: Secondary | ICD-10-CM | POA: Diagnosis not present

## 2018-02-07 DIAGNOSIS — M47896 Other spondylosis, lumbar region: Secondary | ICD-10-CM | POA: Diagnosis not present

## 2018-02-14 ENCOUNTER — Encounter: Payer: Self-pay | Admitting: Family Medicine

## 2018-02-14 DIAGNOSIS — M7062 Trochanteric bursitis, left hip: Secondary | ICD-10-CM | POA: Diagnosis not present

## 2018-02-14 DIAGNOSIS — M47896 Other spondylosis, lumbar region: Secondary | ICD-10-CM | POA: Diagnosis not present

## 2018-02-14 DIAGNOSIS — M7061 Trochanteric bursitis, right hip: Secondary | ICD-10-CM | POA: Diagnosis not present

## 2018-02-14 DIAGNOSIS — M1712 Unilateral primary osteoarthritis, left knee: Secondary | ICD-10-CM | POA: Diagnosis not present

## 2018-02-17 DIAGNOSIS — M7062 Trochanteric bursitis, left hip: Secondary | ICD-10-CM | POA: Diagnosis not present

## 2018-02-17 DIAGNOSIS — M47896 Other spondylosis, lumbar region: Secondary | ICD-10-CM | POA: Diagnosis not present

## 2018-02-17 DIAGNOSIS — M1712 Unilateral primary osteoarthritis, left knee: Secondary | ICD-10-CM | POA: Diagnosis not present

## 2018-02-17 DIAGNOSIS — M7061 Trochanteric bursitis, right hip: Secondary | ICD-10-CM | POA: Diagnosis not present

## 2018-02-20 ENCOUNTER — Ambulatory Visit (INDEPENDENT_AMBULATORY_CARE_PROVIDER_SITE_OTHER): Payer: Medicare Other | Admitting: Sports Medicine

## 2018-02-20 VITALS — BP 132/84 | Ht 66.5 in | Wt 173.0 lb

## 2018-02-20 DIAGNOSIS — M25579 Pain in unspecified ankle and joints of unspecified foot: Secondary | ICD-10-CM

## 2018-02-20 NOTE — Progress Notes (Signed)
  Patient comes in today for follow-up. Unfortunately, she has not found the green sports insoles to be of much help. She claims that they make her feet hot and the scaphoid pad is uncomfortable. She also states that the physical therapist does not believe that her pronation is well corrected with the temporary inserts.[name] Despite all of this, she would like to go ahead with custom orthotics today.  Custom orthotics were created for her today. Total of 30 minutes was spent with the patient in face-to-face consultation discussing orthotic construction, instruction, and fitting. Pronation of her right foot was much better but still not 100% corrected. She'll follow-up with me again in 4 weeks for reevaluation. I might consider adding a small scaphoid pad at that time but I want her to feel comfortable in the base orthotic first. I also explained to both her and her husband that there is no guarantee that these orthotics will help with her chronic pain. They understand.  Patient was fitted for a : standard, cushioned, semi-rigid orthotic. The orthotic was heated and afterward the patient stood on the orthotic blank positioned on the orthotic stand. The patient was positioned in subtalar neutral position and 10 degrees of ankle dorsiflexion in a weight bearing stance. After completion of molding, a stable base was applied to the orthotic blank. The blank was ground to a stable position for weight bearing. Size: 9 (had to trim down to fit into her shoe) Base: Blue EVA Posting: none Additional orthotic padding: none

## 2018-02-22 DIAGNOSIS — M1712 Unilateral primary osteoarthritis, left knee: Secondary | ICD-10-CM | POA: Diagnosis not present

## 2018-02-22 DIAGNOSIS — M7061 Trochanteric bursitis, right hip: Secondary | ICD-10-CM | POA: Diagnosis not present

## 2018-02-22 DIAGNOSIS — M47896 Other spondylosis, lumbar region: Secondary | ICD-10-CM | POA: Diagnosis not present

## 2018-02-22 DIAGNOSIS — M7062 Trochanteric bursitis, left hip: Secondary | ICD-10-CM | POA: Diagnosis not present

## 2018-02-24 DIAGNOSIS — M7062 Trochanteric bursitis, left hip: Secondary | ICD-10-CM | POA: Diagnosis not present

## 2018-02-24 DIAGNOSIS — M47896 Other spondylosis, lumbar region: Secondary | ICD-10-CM | POA: Diagnosis not present

## 2018-02-24 DIAGNOSIS — M7061 Trochanteric bursitis, right hip: Secondary | ICD-10-CM | POA: Diagnosis not present

## 2018-02-24 DIAGNOSIS — M1712 Unilateral primary osteoarthritis, left knee: Secondary | ICD-10-CM | POA: Diagnosis not present

## 2018-02-27 ENCOUNTER — Ambulatory Visit (INDEPENDENT_AMBULATORY_CARE_PROVIDER_SITE_OTHER): Payer: Medicare Other

## 2018-02-27 ENCOUNTER — Telehealth: Payer: Self-pay | Admitting: *Deleted

## 2018-02-27 ENCOUNTER — Other Ambulatory Visit: Payer: Self-pay

## 2018-02-27 VITALS — BP 146/80 | HR 55 | Ht 67.0 in | Wt 178.2 lb

## 2018-02-27 DIAGNOSIS — Z0000 Encounter for general adult medical examination without abnormal findings: Secondary | ICD-10-CM | POA: Diagnosis not present

## 2018-02-27 DIAGNOSIS — Z1239 Encounter for other screening for malignant neoplasm of breast: Secondary | ICD-10-CM

## 2018-02-27 DIAGNOSIS — E039 Hypothyroidism, unspecified: Secondary | ICD-10-CM

## 2018-02-27 DIAGNOSIS — E2839 Other primary ovarian failure: Secondary | ICD-10-CM | POA: Diagnosis not present

## 2018-02-27 DIAGNOSIS — E7800 Pure hypercholesterolemia, unspecified: Secondary | ICD-10-CM

## 2018-02-27 NOTE — Telephone Encounter (Signed)
-----   Message from Ralph Dowdy, [place] sent at 02/27/2018 12:02 PM EDT ----- Regarding: labs  Hi,   Inice was here to see Maudie Mercury and she wants to get blood work prior to her next appt.  If needed, can you please place orders?  Thank you!

## 2018-02-27 NOTE — Patient Instructions (Addendum)
Schedule mammogram and bone scan.   Bring a copy of your living will and/or healthcare power of attorney to your next office visit.  Continue doing brain stimulating activities (puzzles, reading, adult coloring books, staying active) to keep memory sharp.   Health Maintenance, Female Adopting a healthy lifestyle and getting preventive care can go a long way to promote health and wellness. Talk with your health care provider about what schedule of regular examinations is right for you. This is a good chance for you to check in with your provider about disease prevention and staying healthy. In between checkups, there are plenty of things you can do on your own. Experts have done a lot of research about which lifestyle changes and preventive measures are most likely to keep you healthy. Ask your health care provider for more information. Weight and diet Eat a healthy diet  Be sure to include plenty of vegetables, fruits, low-fat dairy products, and lean protein.  Do not eat a lot of foods high in solid fats, added sugars, or salt.  Get regular exercise. This is one of the most important things you can do for your health. ? Most adults should exercise for at least 150 minutes each week. The exercise should increase your heart rate and make you sweat (moderate-intensity exercise). ? Most adults should also do strengthening exercises at least twice a week. This is in addition to the moderate-intensity exercise.  Maintain a healthy weight  Body mass index (BMI) is a measurement that can be used to identify possible weight problems. It estimates body fat based on height and weight. Your health care provider can help determine your BMI and help you achieve or maintain a healthy weight.  For females 40 years of age and older: ? A BMI below 18.5 is considered underweight. ? A BMI of 18.5 to 24.9 is normal. ? A BMI of 25 to 29.9 is considered overweight. ? A BMI of 30 and above is considered  obese.  Watch levels of cholesterol and blood lipids  You should start having your blood tested for lipids and cholesterol at 71 years of age, then have this test every 5 years.  You may need to have your cholesterol levels checked more often if: ? Your lipid or cholesterol levels are high. ? You are older than 71 years of age. ? You are at high risk for heart disease.  Cancer screening Lung Cancer  Lung cancer screening is recommended for adults 55-67 years old who are at high risk for lung cancer because of a history of smoking.  A yearly low-dose CT scan of the lungs is recommended for people who: ? Currently smoke. ? Have quit within the past 15 years. ? Have at least a 30-pack-year history of smoking. A pack year is smoking an average of one pack of cigarettes a day for 1 year.  Yearly screening should continue until it has been 15 years since you quit.  Yearly screening should stop if you develop a health problem that would prevent you from having lung cancer treatment.  Breast Cancer  Practice breast self-awareness. This means understanding how your breasts normally appear and feel.  It also means doing regular breast self-exams. Let your health care provider know about any changes, no matter how small.  If you are in your 20s or 30s, you should have a clinical breast exam (CBE) by a health care provider every 1-3 years as part of a regular health exam.  If you are  65 or older, have a CBE every year. Also consider having a breast X-ray (mammogram) every year.  If you have a family history of breast cancer, talk to your health care provider about genetic screening.  If you are at high risk for breast cancer, talk to your health care provider about having an MRI and a mammogram every year.  Breast cancer gene (BRCA) assessment is recommended for women who have family members with BRCA-related cancers. BRCA-related cancers  include: ? Breast. ? Ovarian. ? Tubal. ? Peritoneal cancers.  Results of the assessment will determine the need for genetic counseling and BRCA1 and BRCA2 testing.  Cervical Cancer Your health care provider may recommend that you be screened regularly for cancer of the pelvic organs (ovaries, uterus, and vagina). This screening involves a pelvic examination, including checking for microscopic changes to the surface of your cervix (Pap test). You may be encouraged to have this screening done every 3 years, beginning at age 72.  For women ages 67-65, health care providers may recommend pelvic exams and Pap testing every 3 years, or they may recommend the Pap and pelvic exam, combined with testing for human papilloma virus (HPV), every 5 years. Some types of HPV increase your risk of cervical cancer. Testing for HPV may also be done on women of any age with unclear Pap test results.  Other health care providers may not recommend any screening for nonpregnant women who are considered low risk for pelvic cancer and who do not have symptoms. Ask your health care provider if a screening pelvic exam is right for you.  If you have had past treatment for cervical cancer or a condition that could lead to cancer, you need Pap tests and screening for cancer for at least 20 years after your treatment. If Pap tests have been discontinued, your risk factors (such as having a new sexual partner) need to be reassessed to determine if screening should resume. Some women have medical problems that increase the chance of getting cervical cancer. In these cases, your health care provider may recommend more frequent screening and Pap tests.  Colorectal Cancer  This type of cancer can be detected and often prevented.  Routine colorectal cancer screening usually begins at 71 years of age and continues through 71 years of age.  Your health care provider may recommend screening at an earlier age if you have risk factors  for colon cancer.  Your health care provider may also recommend using home test kits to check for hidden blood in the stool.  A small camera at the end of a tube can be used to examine your colon directly (sigmoidoscopy or colonoscopy). This is done to check for the earliest forms of colorectal cancer.  Routine screening usually begins at age 25.  Direct examination of the colon should be repeated every 5-10 years through 71 years of age. However, you may need to be screened more often if early forms of precancerous polyps or small growths are found.  Skin Cancer  Check your skin from head to toe regularly.  Tell your health care provider about any new moles or changes in moles, especially if there is a change in a mole's shape or color.  Also tell your health care provider if you have a mole that is larger than the size of a pencil eraser.  Always use sunscreen. Apply sunscreen liberally and repeatedly throughout the day.  Protect yourself by wearing long sleeves, pants, a wide-brimmed hat, and sunglasses whenever you  are outside.  Heart disease, diabetes, and high blood pressure  High blood pressure causes heart disease and increases the risk of stroke. High blood pressure is more likely to develop in: ? People who have blood pressure in the high end of the normal range (130-139/85-89 mm Hg). ? People who are overweight or obese. ? People who are African American.  If you are 51-4 years of age, have your blood pressure checked every 3-5 years. If you are 45 years of age or older, have your blood pressure checked every year. You should have your blood pressure measured twice-once when you are at a hospital or clinic, and once when you are not at a hospital or clinic. Record the average of the two measurements. To check your blood pressure when you are not at a hospital or clinic, you can use: ? An automated blood pressure machine at a pharmacy. ? A home blood pressure monitor.  If  you are between 12 years and 83 years old, ask your health care provider if you should take aspirin to prevent strokes.  Have regular diabetes screenings. This involves taking a blood sample to check your fasting blood sugar level. ? If you are at a normal weight and have a low risk for diabetes, have this test once every three years after 71 years of age. ? If you are overweight and have a high risk for diabetes, consider being tested at a younger age or more often. Preventing infection Hepatitis B  If you have a higher risk for hepatitis B, you should be screened for this virus. You are considered at high risk for hepatitis B if: ? You were born in a country where hepatitis B is common. Ask your health care provider which countries are considered high risk. ? Your parents were born in a high-risk country, and you have not been immunized against hepatitis B (hepatitis B vaccine). ? You have HIV or AIDS. ? You use needles to inject street drugs. ? You live with someone who has hepatitis B. ? You have had sex with someone who has hepatitis B. ? You get hemodialysis treatment. ? You take certain medicines for conditions, including cancer, organ transplantation, and autoimmune conditions.  Hepatitis C  Blood testing is recommended for: ? Everyone born from [birth year] through 1964. ? Anyone with known risk factors for hepatitis C.  Sexually transmitted infections (STIs)  You should be screened for sexually transmitted infections (STIs) including gonorrhea and chlamydia if: ? You are sexually active and are younger than 71 years of age. ? You are older than 71 years of age and your health care provider tells you that you are at risk for this type of infection. ? Your sexual activity has changed since you were last screened and you are at an increased risk for chlamydia or gonorrhea. Ask your health care provider if you are at risk.  If you do not have HIV, but are at risk, it may be recommended  that you take a prescription medicine daily to prevent HIV infection. This is called pre-exposure prophylaxis (PrEP). You are considered at risk if: ? You are sexually active and do not regularly use condoms or know the HIV status of your partner(s). ? You take drugs by injection. ? You are sexually active with a partner who has HIV.  Talk with your health care provider about whether you are at high risk of being infected with HIV. If you choose to begin PrEP, you should first be  for HIV. You should then be tested every 3 months for as long as you are taking PrEP. Pregnancy  If you are premenopausal and you may become pregnant, ask your health care provider about preconception counseling.  If you may become pregnant, take 400 to 800 micrograms (mcg) of folic acid every day.  If you want to prevent pregnancy, talk to your health care provider about birth control (contraception). Osteoporosis and menopause  Osteoporosis is a disease in which the bones lose minerals and strength with aging. This can result in serious bone fractures. Your risk for osteoporosis can be identified using a bone density scan.  If you are 65 years of age or older, or if you are at risk for osteoporosis and fractures, ask your health care provider if you should be screened.  Ask your health care provider whether you should take a calcium or vitamin D supplement to lower your risk for osteoporosis.  Menopause may have certain physical symptoms and risks.  Hormone replacement therapy may reduce some of these symptoms and risks. Talk to your health care provider about whether hormone replacement therapy is right for you. Follow these instructions at home:  Schedule regular health, dental, and eye exams.  Stay current with your immunizations.  Do not use any tobacco products including cigarettes, chewing tobacco, or electronic cigarettes.  If you are pregnant, do not drink alcohol.  If you are  breastfeeding, limit how much and how often you drink alcohol.  Limit alcohol intake to no more than 1 drink per day for nonpregnant women. One drink equals 12 ounces of beer, 5 ounces of wine, or 1 ounces of hard liquor.  Do not use street drugs.  Do not share needles.  Ask your health care provider for help if you need support or information about quitting drugs.  Tell your health care provider if you often feel depressed.  Tell your health care provider if you have ever been abused or do not feel safe at home. This information is not intended to replace advice given to you by your health care provider. Make sure you discuss any questions you have with your health care provider. Document Released: 03/22/2011 Document Revised: 02/12/2016 Document Reviewed: 06/10/2015 [employer]vier Interactive Patient Education  2018 [employer]  

## 2018-02-27 NOTE — Telephone Encounter (Signed)
Labs ordered---but her next f/u should not be until about 5-6 mo from now.-thx

## 2018-02-27 NOTE — Telephone Encounter (Signed)
Pt advised and voiced understanding.   

## 2018-02-27 NOTE — Telephone Encounter (Signed)
Will pt be due for any labs at her next f/u? Please advise. Thanks.

## 2018-02-27 NOTE — Progress Notes (Signed)
Subjective:   [name] is a 72 y.o. female who presents for Medicare Annual (Subsequent) preventive examination.  Review of Systems:  No ROS.  Medicare Wellness Visit. Additional risk factors are reflected in the social history.  Cardiac Risk Factors include: advanced age (>62mn, >>85women);hypertension;dyslipidemia;family history of premature cardiovascular disease   Sleep patterns: Sleeps 5 hours, reports sleep apnea. Does not wear CPAP.  Home Safety/Smoke Alarms: Feels safe in home. Smoke alarms in place.  Living environment; residence and Firearm Safety: Lives with husband in 1 story home.  Seat Belt Safety/Bike Helmet: Wears seat belt.   Female:   [id number]      Mammo-03/08/2017, BI-RADS CATEGORY  1: Negative. Ordered today. MCHP Dexa scan-04/25/2014, osteopenia. Ordered today. MCHP        CCS-Colonoscopy 05/02/2006, Diverticulosis. Recall prn. Declines testing at this time.      Objective:     Vitals: BP (!) 146/80 (BP Location: Left Arm, Patient Position: Sitting, Cuff Size: Normal)   Pulse (!) 55   Ht _0  (1.702 m)   Wt 178 lb 4 oz (80.9 kg)   SpO2 97%   BMI 27.92 kg/m   Body mass index is 27.92 kg/m.  Advanced Directives 02/27/2018 10/06/2017 04/28/2017 03/25/2017 12/29/2016 08/02/2016 05/26/2016  Does Patient Have a Medical Advance Directive? Yes Yes Yes No No Yes Yes  Type of Advance Directive Living will;Healthcare Power of Attorney Living will Living will - - Living will;[place]care [place]  Does patient want to make changes to medical advance directive? - No - Patient declined - - - No - Patient declined -  Copy of [place]? No - copy requested - - - - No - copy requested No - copy requested    Tobacco Social History   Tobacco Use  Smoking Status Never Smoker  Smokeless Tobacco Never Used  Tobacco Comment   NEVER USED TOBACCO     Counseling given: Not Answered Comment:  NEVER USED TOBACCO   Past Medical History:  Diagnosis Date  . Allergic rhinitis   . Anxiety   . Chest pain    cr  . Chronic gastritis    dx'd by EGD 2018 (erosive, likely NSAID-induced).  . Chronic pain syndrome    right knee osteo, hx of TKA in R knee.  Pt was told by [name] that no further surgical options are available for her--[name]  . Chronic renal insufficiency, stage II (mild) 2015   vs acute fluctuations depending on hydration?---GFR from 60s up to 80s.  . Diverticulosis of colon    with hx of 'itis  . DJD (degenerative joint disease)    L spine and knees  . Epigastric pain    As of 01/2017, GI (Dr. DLoletha Carrow planned EGD.  .Marland KitchenFATIGUE / MALAISE 07/01/2009   Chronic  . Fibromyalgia   . GERD (gastroesophageal reflux disease)   . History of colonic polyps   . HTN (hypertension)   . Hypercholesterolemia   . Hypothyroid 07/05/2012  . IBS (irritable bowel syndrome)   . IC (interstitial cystitis)    [name] at [facility] . Iron deficiency anemia, unspecified 06/27/2013   Has required IV iron on multiple occasions (Dr. EMarin Olp.  Hemoccults neg 12/2016.  . Lumbar back pain    Spinal stenosis, lumbar region with neurogenic claudication  . Major depression, recurrent ([place]    vs dysthymia,chronic  . Memory impairment 10/03/2012  Memory impairment 10/03/2012 >referral to neuro    . Monoclonal gammopathy of undetermined significance    IgM kappa MGUS ([name]) routine f/u has shown stability of this problem.  . OSA (obstructive sleep apnea)    Latest sleep study 01/2017.  Some adjustments to CPAP being made+ dental referral for consideration of oral appliance --per pulm MD.  . Osteopenia 04/2014   T score -2.0 FRAX 11%/1.8%  . Otitis externa, candida 79/1505   [name] tx'd her with clotrimazole 1% external solution x 1 mo  . Palpitations   . Psoriasis   . Vitamin D deficiency   . Xerostomia    and xerophthalmia--per [name] (he suggests  SSA, SSB, ESR, rh factor, ANA, and antimicrosmal and antithyroglobulin ab's as of 02/17/16--all these labs were NORMAL.   Past Surgical History:  Procedure Laterality Date  . ABDOMINAL HYSTERECTOMY     for prolapse with abdominal incision  . BACK SURGERY  01/23/2015   Lumbar decompression with L2-L5 fusion ([name])  . CARDIAC CATHETERIZATION     no PCI  . CHOLECYSTECTOMY    . COLONOSCOPY  04/2006   Diverticulosis, o/w normal.  . ESOPHAGOGASTRODUODENOSCOPY  05/29/2009; 03/01/17   Esoph normal.  Gastritis (H pylori NEG) + gastric polyps (benign).  Duodenal biopsy NORMAL.  2018-mild chronic gastritis, H pylori NEG.  . EYE SURGERY Left    "lazy eye"  . HAND SURGERY Right    TRIGGER FINGER; index finger  . HERNIA REPAIR     umbilical  . KNEE ARTHROSCOPY     RIGHT KNEE X 5  . LAMINOTOMY  2010   L-4, L-5 FUSION  . NASAL SINUS SURGERY     polypectomy (remote past)  . OOPHORECTOMY  2010   Laparoscopic BSO (path benign)  . PATELLA FRACTURE SURGERY  10-08   [name]  . POSTERIOR LAMINECTOMY / DECOMPRESSION LUMBAR SPINE  9-09   [name]  . TOTAL KNEE ARTHROPLASTY  12-07 and 4-07   [name], revision 12-09 [name] at [facility]   Family History  Problem Relation Age of Onset  . Leukemia Brother   . Lung disease Brother   . Thyroid disease Brother   . Melanoma Brother   . Hypertension Mother   . Thyroid disease Mother   . Hypertension Father   . Breast cancer Sister        Age 80's  . Hypertension Sister   . Coronary artery disease Sister   . Lupus Sister   . Thyroid disease Sister   . Stomach cancer Brother   . Cancer Other        "all over"  . Parkinson's disease Sister    Social History   Socioeconomic History  . Marital status: Married    Spouse name: Malvina Schadler  . Number of children: 0  . Years of education: Not on file  . Highest education level: Not on file  Occupational History    Employer: RETIRED  Social Needs  . Financial resource strain: Not on file  .  Food insecurity:    Worry: Not on file    Inability: Not on file  . Transportation needs:    Medical: Not on file    Non-medical: Not on file  Tobacco Use  . Smoking status: Never Smoker  . Smokeless tobacco: Never Used  . Tobacco comment: NEVER USED TOBACCO  Substance and Sexual Activity  . Alcohol use: No    Alcohol/week: 0.0 oz  . Drug use: No  .  Sexual activity: Not Currently    Birth control/protection: Surgical    Comment: 1st intercourse 72 yo-Fewer than 5 partners  Lifestyle  . Physical activity:    Days per week: Not on file    Minutes per session: Not on file  . Stress: Not on file  Relationships  . Social connections:    Talks on phone: Not on file    Gets together: Not on file    Attends religious service: Not on file    Active member of club or organization: Not on file    Attends meetings of clubs or organizations: Not on file    Relationship status: Not on file  Other Topics Concern  . Not on file  Social History Narrative   Married, no children.   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   No Tob.  No alc.  No drugs.   Orig from [place].    Outpatient Encounter Medications as of 02/27/2018  Medication Sig  . acetaminophen (TYLENOL) 325 MG tablet Take 650 mg by mouth every 6 (six) hours as needed.  . ALPRAZolam (XANAX) 0.5 MG tablet TAKE ONE-HALF TO ONE TABLET BY MOUTH THREE TIMES DAILY  . Azelastine HCl (ASTEPRO) 0.15 % SOLN 2 sprays each nostril every 12 hours (Patient taking differently: Place 2 sprays into the nose 2 (two) times daily as needed (Allergies). 2 sprays each nostril every 12 hours)  . calcium carbonate (OSCAL) 1500 (600 Ca) MG TABS tablet Take by mouth daily.  . Cholecalciferol (VITAMIN D) 2000 UNITS CAPS Take 5 capsules by mouth daily.   . cyclobenzaprine (FLEXERIL) 5 MG tablet as needed.   . dicyclomine (BENTYL) 20 MG tablet Take 1 tablet (20 mg total) by mouth 2 (two) times daily.  . diphenhydrAMINE (BENADRYL) 25 MG tablet  Take 25 mg by mouth at bedtime as needed.  . hydrocortisone 2.5 % cream Apply 1 application topically as needed. RECTAL ITCHING  . levothyroxine (SYNTHROID, LEVOTHROID) 75 MCG tablet TAKE 1/2 (ONE-HALF) TABLET BY MOUTH ONCE DAILY  . meclizine (ANTIVERT) 25 MG tablet Take 1 tablet (25 mg total) by mouth every 6 (six) hours as needed for dizziness.  . meloxicam (MOBIC) 7.5 MG tablet TAKE TWO TABLETS BY MOUTH ONCE DAILY  . metoprolol tartrate (LOPRESSOR) 25 MG tablet TAKE ONE TABLET BY MOUTH ONCE DAILY  . Multiple Vitamins-Iron (MULTIVITAMIN/IRON PO) Take 1 tablet by mouth daily.   [name] NIACIN PO Take by mouth.  [name] omeprazole (PRILOSEC) 40 MG capsule Take 1 capsule (40 mg total) by mouth daily.  . Oxycodone HCl 10 MG TABS 10 mg every 4 (four) hours as needed. 1 every 6 hours for pain  . Probiotic Product (ALIGN) 4 MG CAPS Take 1 capsule (4 mg total) by mouth daily.  . promethazine (PHENERGAN) 25 MG tablet TAKE ONE TABLET BY MOUTH EVERY 6 HOURS AS NEEDED FOR NAUSEA AND VOMITING  . Specialty Vitamins Products (MAGNESIUM, AMINO ACID CHELATE,) 133 MG tablet Take 1 tablet by mouth daily.  . traZODone (DESYREL) 50 MG tablet Take 1 tablet by mouth at bedtime.  [name] amoxicillin (AMOXIL) 500 MG capsule 4 tabs po x 1 dose, one hour prior to dental procedure (Patient not taking: Reported on 02/27/2018)  . MELATONIN PO Take by mouth.  . [DISCONTINUED] Inositol Niacinate 500 MG CAPS Take 1 capsule by mouth at bedtime.   No facility-administered encounter medications on file as of 02/27/2018.     Activities of Daily Living In your present state of health, do you have  any difficulty performing the following activities: 02/27/2018  Hearing? N  Vision? N  Difficulty concentrating or making decisions? N  Walking or climbing stairs? N  Dressing or bathing? N  Doing errands, shopping? N  Preparing Food and eating ? N  Using the Toilet? N  In the past six months, have you accidently leaked urine? N  Do you have  problems with loss of bowel control? N  Managing your Medications? N  Managing your Finances? N  Housekeeping or managing your Housekeeping? N  Some recent data might be hidden    Patient Care Team: [name], MD as PCP - General (Family Medicine) Chesley Mires, MD as Consulting Physician (Pulmonary Disease) MacDiarmid, Nicki Reaper, MD as Consulting Physician (Urology) Bo Merino, MD as Consulting Physician (Rheumatology) Fontaine, Belinda Block, MD as Consulting Physician (Gynecology) Almedia Balls, MD as Consulting Physician (Orthopedic Surgery) [name], MD as Consulting Physician (Oncology) Jodi Marble, MD as Consulting Physician (Otolaryngology) Renato Shin, MD as Consulting Physician (Endocrinology) [place], Holli Humbles, NP as Nurse Practitioner (Hematology and Oncology) Macarthur Critchley, [place] as Referring Physician (Optometry) Lacretia Leigh, DMD (Dentistry) Deneise Lever, MD as Consulting Physician (Pulmonary Disease) Danis, Kirke Corin, MD as Consulting Physician (Gastroenterology) Margaretha Sheffield, MD as Consulting Physician (Physical Medicine and Rehabilitation) Micheline Chapman, Carlos Levering, DO as Consulting Physician (Sports Medicine) Earnie Larsson, MD as Consulting Physician (Neurosurgery)    Assessment:   This is a routine wellness examination for [name].  Exercise Activities and Dietary recommendations Current Exercise Habits: Structured exercise class(Maintains housework), Time (Minutes): 60, Frequency (Times/Week): 2, Weekly Exercise (Minutes/Week): 120, Exercise limited by: orthopedic condition(s)   Diet (meal preparation, eat out, water intake, caffeinated beverages, dairy products, fruits and vegetables): Drinks water (1 bottle/day), tea and Dr. Malachi Bonds.   Breakfast: Dr Malachi Bonds Lunch: beans; corn; vegetable Dinner: vegetables     Goals    . LDL CALC < 100    . Patient Stated     Decrease Dr. Malachi Bonds intake.        Fall Risk Fall Risk  02/27/2018 01/23/2018  08/02/2016 05/26/2016 12/23/2015  Falls in the past year? _0     Depression Screen PHQ 2/9 Scores 02/27/2018 01/20/2017 08/02/2016 06/04/2015  PHQ - 2 Score 1 3 0 2  PHQ- 9 Score 11 8 - 8     Cognitive Function       Ad8 score reviewed for issues:  Issues making decisions: no  Less interest in hobbies / activities: no  Repeats questions, stories (family complaining): no  Trouble using ordinary gadgets (microwave, computer, phone): no  Forgets the month or year: no  Mismanaging finances: no  Remembering appts: no   Daily problems with thinking and/or memory:no Ad8 score is=0     Immunization History  Administered Date(s) Administered  . Influenza Split 08/02/2011, 08/16/2012, 09/18/2013  . Influenza Whole 07/23/2009, 07/29/2010  . Influenza, High Dose Seasonal PF 09/10/2015, 08/02/2016  . Influenza,inj,Quad PF,6+ Mos 06/19/2014  . Pneumococcal-Unspecified 09/20/2009  . Zoster 06/21/2011    Screening Tests Health Maintenance  Topic Date Due  . PNA vac Low Risk Adult (1 of 2 - PCV13) 07/22/2018 (Originally 12/03/2011)  . COLONOSCOPY  02/28/2019 (Originally 06/19/2014)  . Hepatitis C Screening  02/28/2019 (Originally 1946/09/11)  . INFLUENZA VACCINE  04/20/2018  . MAMMOGRAM  03/09/2019  . TETANUS/TDAP  04/23/2019  . DEXA SCAN  Completed       Plan:     Schedule mammogram and bone scan.  Bring a copy of your living will and/or healthcare power of attorney to your next office visit.  Continue doing brain stimulating activities (puzzles, reading, adult coloring books, staying active) to keep memory sharp.   I have personally reviewed and noted the following in the patient's chart:   . Medical and social history . Use of alcohol, tobacco or illicit drugs  . Current medications and supplements . Functional ability and status . Nutritional status . Physical activity . Advanced directives . List of other physicians . Hospitalizations, surgeries, and  ER visits in previous 12 months . Vitals . Screenings to include cognitive, depression, and falls . Referrals and appointments  In addition, I have reviewed and discussed with patient certain preventive protocols, quality metrics, and best practice recommendations. A written personalized care plan for preventive services as well as general preventive health recommendations were provided to patient.     Gerilyn Nestle, RN  02/27/2018   PCP Notes: -PHQ9=11, declines medical intervention -Currently going to PT 2/week for back/knee pain.  -F/U with PCP 07/2018

## 2018-02-28 DIAGNOSIS — M1712 Unilateral primary osteoarthritis, left knee: Secondary | ICD-10-CM | POA: Diagnosis not present

## 2018-02-28 DIAGNOSIS — M47896 Other spondylosis, lumbar region: Secondary | ICD-10-CM | POA: Diagnosis not present

## 2018-02-28 DIAGNOSIS — M7061 Trochanteric bursitis, right hip: Secondary | ICD-10-CM | POA: Diagnosis not present

## 2018-02-28 DIAGNOSIS — M7062 Trochanteric bursitis, left hip: Secondary | ICD-10-CM | POA: Diagnosis not present

## 2018-03-02 DIAGNOSIS — M1712 Unilateral primary osteoarthritis, left knee: Secondary | ICD-10-CM | POA: Diagnosis not present

## 2018-03-02 DIAGNOSIS — M47896 Other spondylosis, lumbar region: Secondary | ICD-10-CM | POA: Diagnosis not present

## 2018-03-02 DIAGNOSIS — M7062 Trochanteric bursitis, left hip: Secondary | ICD-10-CM | POA: Diagnosis not present

## 2018-03-02 DIAGNOSIS — M7061 Trochanteric bursitis, right hip: Secondary | ICD-10-CM | POA: Diagnosis not present

## 2018-03-02 NOTE — Progress Notes (Signed)
AWV reviewed and agree. Signed:  Phil McGowen, MD           03/02/2018  

## 2018-03-03 DIAGNOSIS — G894 Chronic pain syndrome: Secondary | ICD-10-CM | POA: Diagnosis not present

## 2018-03-03 DIAGNOSIS — M62830 Muscle spasm of back: Secondary | ICD-10-CM | POA: Diagnosis not present

## 2018-03-03 DIAGNOSIS — K5900 Constipation, unspecified: Secondary | ICD-10-CM | POA: Diagnosis not present

## 2018-03-03 DIAGNOSIS — M961 Postlaminectomy syndrome, not elsewhere classified: Secondary | ICD-10-CM | POA: Diagnosis not present

## 2018-03-06 DIAGNOSIS — M7062 Trochanteric bursitis, left hip: Secondary | ICD-10-CM | POA: Diagnosis not present

## 2018-03-06 DIAGNOSIS — M1712 Unilateral primary osteoarthritis, left knee: Secondary | ICD-10-CM | POA: Diagnosis not present

## 2018-03-06 DIAGNOSIS — M7061 Trochanteric bursitis, right hip: Secondary | ICD-10-CM | POA: Diagnosis not present

## 2018-03-06 DIAGNOSIS — M47896 Other spondylosis, lumbar region: Secondary | ICD-10-CM | POA: Diagnosis not present

## 2018-03-13 DIAGNOSIS — M47896 Other spondylosis, lumbar region: Secondary | ICD-10-CM | POA: Diagnosis not present

## 2018-03-13 DIAGNOSIS — M7061 Trochanteric bursitis, right hip: Secondary | ICD-10-CM | POA: Diagnosis not present

## 2018-03-13 DIAGNOSIS — M1712 Unilateral primary osteoarthritis, left knee: Secondary | ICD-10-CM | POA: Diagnosis not present

## 2018-03-13 DIAGNOSIS — M7062 Trochanteric bursitis, left hip: Secondary | ICD-10-CM | POA: Diagnosis not present

## 2018-03-15 DIAGNOSIS — M47896 Other spondylosis, lumbar region: Secondary | ICD-10-CM | POA: Diagnosis not present

## 2018-03-15 DIAGNOSIS — M7061 Trochanteric bursitis, right hip: Secondary | ICD-10-CM | POA: Diagnosis not present

## 2018-03-15 DIAGNOSIS — M1712 Unilateral primary osteoarthritis, left knee: Secondary | ICD-10-CM | POA: Diagnosis not present

## 2018-03-15 DIAGNOSIS — M7062 Trochanteric bursitis, left hip: Secondary | ICD-10-CM | POA: Diagnosis not present

## 2018-03-20 ENCOUNTER — Other Ambulatory Visit: Payer: Self-pay | Admitting: Family Medicine

## 2018-03-20 ENCOUNTER — Encounter: Payer: Medicare Other | Admitting: Sports Medicine

## 2018-03-20 DIAGNOSIS — M7061 Trochanteric bursitis, right hip: Secondary | ICD-10-CM | POA: Diagnosis not present

## 2018-03-20 DIAGNOSIS — M7062 Trochanteric bursitis, left hip: Secondary | ICD-10-CM | POA: Diagnosis not present

## 2018-03-20 DIAGNOSIS — M47896 Other spondylosis, lumbar region: Secondary | ICD-10-CM | POA: Diagnosis not present

## 2018-03-20 DIAGNOSIS — M1712 Unilateral primary osteoarthritis, left knee: Secondary | ICD-10-CM | POA: Diagnosis not present

## 2018-03-20 NOTE — Telephone Encounter (Signed)
Copied from [place] [id number]. Topic: Quick Communication - Rx Refill/Question >> Mar 20, 2018 12:29 PM Neva Seat wrote: ALPRAZolam Duanne Moron) 0.5 MG tablet  Needing Rx called into new location.  [id number] [place] [place] [address] Phone: [phone] Fax: [phone]

## 2018-03-20 NOTE — Telephone Encounter (Signed)
Xanax refill Last Refill:01/20/17 #270 with 1 refill Last OV: 01/23/18 PCP: [place] [address]

## 2018-03-21 ENCOUNTER — Telehealth: Payer: Self-pay | Admitting: *Deleted

## 2018-03-21 NOTE — Telephone Encounter (Signed)
Patient requesting refill on her xanax. Please advise.

## 2018-03-21 NOTE — Telephone Encounter (Signed)
Patient up to date on OV.  Last OV was 5/6 for follow up w/ DR McGowen.  AWV w/ Maudie Mercury was 02/27/18.    Please advise if RX for alprazolam can be sent for patient to [address] [facility].

## 2018-03-22 DIAGNOSIS — M7062 Trochanteric bursitis, left hip: Secondary | ICD-10-CM | POA: Diagnosis not present

## 2018-03-22 DIAGNOSIS — M47896 Other spondylosis, lumbar region: Secondary | ICD-10-CM | POA: Diagnosis not present

## 2018-03-22 DIAGNOSIS — M1712 Unilateral primary osteoarthritis, left knee: Secondary | ICD-10-CM | POA: Diagnosis not present

## 2018-03-22 DIAGNOSIS — M7061 Trochanteric bursitis, right hip: Secondary | ICD-10-CM | POA: Diagnosis not present

## 2018-03-27 ENCOUNTER — Ambulatory Visit (INDEPENDENT_AMBULATORY_CARE_PROVIDER_SITE_OTHER): Payer: Medicare Other | Admitting: Sports Medicine

## 2018-03-27 VITALS — BP 122/80 | Ht 66.5 in | Wt 172.0 lb

## 2018-03-27 DIAGNOSIS — M25579 Pain in unspecified ankle and joints of unspecified foot: Secondary | ICD-10-CM

## 2018-03-27 NOTE — Progress Notes (Signed)
   Subjective:    Patient ID: [name], female    DOB: 1946/05/11, 72 y.o.   MRN: [id number]  HPI   Patient comes in today for follow-up. Orthotics have definitely helped with her foot pain. Her physical therapist is concerned about a possible leg length discrepancy.   Review of Systems As above    Objective:   Physical Exam  Well developed, well-nourished. No acute distress. Awake alert and oriented 3.  Gait analysis still shows pronation on the right despite custom orthotics. Right-sided shoulder drop with ambulation. Mild antalgic gait.  Evaluation of her hips does show some pelvic inequality with a negative Trendelenburg.  Leg lengths are equal with the patient lying supine      Assessment & Plan:   Improving foot pain secondary to pronation  I think the patient's shoulder drop with ambulating is due in part to her persistent and severe pronation on the right. I do not appreciate a true leg length discrepancy but I do agree that she may have some pelvic tilt which may be contributing. After placing the scaphoid pad on her right orthotic her gait was improved. However, there is still some noticeable pronation and I'm not sure how much more of a correction we will be able to achieve. She will follow-up with me in 4 weeks for reevaluation. Call with questions or concerns in the interim.

## 2018-03-29 ENCOUNTER — Other Ambulatory Visit: Payer: Self-pay

## 2018-03-29 DIAGNOSIS — M1712 Unilateral primary osteoarthritis, left knee: Secondary | ICD-10-CM | POA: Diagnosis not present

## 2018-03-29 DIAGNOSIS — M7062 Trochanteric bursitis, left hip: Secondary | ICD-10-CM | POA: Diagnosis not present

## 2018-03-29 DIAGNOSIS — M47896 Other spondylosis, lumbar region: Secondary | ICD-10-CM | POA: Diagnosis not present

## 2018-03-29 DIAGNOSIS — M7061 Trochanteric bursitis, right hip: Secondary | ICD-10-CM | POA: Diagnosis not present

## 2018-03-29 MED ORDER — ALPRAZOLAM 0.5 MG PO TABS: ORAL_TABLET | ORAL | 1 refills | Status: DC

## 2018-03-29 NOTE — Telephone Encounter (Signed)
Patient notified

## 2018-03-29 NOTE — Telephone Encounter (Signed)
[facility]razolam eRx'd to [facility] [address] just now.-thx

## 2018-03-31 DIAGNOSIS — M7061 Trochanteric bursitis, right hip: Secondary | ICD-10-CM | POA: Diagnosis not present

## 2018-03-31 DIAGNOSIS — M47896 Other spondylosis, lumbar region: Secondary | ICD-10-CM | POA: Diagnosis not present

## 2018-03-31 DIAGNOSIS — M1712 Unilateral primary osteoarthritis, left knee: Secondary | ICD-10-CM | POA: Diagnosis not present

## 2018-03-31 DIAGNOSIS — M7062 Trochanteric bursitis, left hip: Secondary | ICD-10-CM | POA: Diagnosis not present

## 2018-03-31 MED ORDER — MELOXICAM 7.5 MG PO TABS: 15.0000 mg | ORAL_TABLET | Freq: Every day | ORAL | 1 refills | Status: DC

## 2018-04-04 ENCOUNTER — Encounter: Payer: Self-pay | Admitting: Family Medicine

## 2018-04-04 DIAGNOSIS — G894 Chronic pain syndrome: Secondary | ICD-10-CM | POA: Diagnosis not present

## 2018-04-04 DIAGNOSIS — K5900 Constipation, unspecified: Secondary | ICD-10-CM | POA: Diagnosis not present

## 2018-04-04 DIAGNOSIS — M62830 Muscle spasm of back: Secondary | ICD-10-CM | POA: Diagnosis not present

## 2018-04-04 DIAGNOSIS — M961 Postlaminectomy syndrome, not elsewhere classified: Secondary | ICD-10-CM | POA: Diagnosis not present

## 2018-04-05 DIAGNOSIS — M47896 Other spondylosis, lumbar region: Secondary | ICD-10-CM | POA: Diagnosis not present

## 2018-04-05 DIAGNOSIS — M1712 Unilateral primary osteoarthritis, left knee: Secondary | ICD-10-CM | POA: Diagnosis not present

## 2018-04-05 DIAGNOSIS — M7061 Trochanteric bursitis, right hip: Secondary | ICD-10-CM | POA: Diagnosis not present

## 2018-04-05 DIAGNOSIS — M7062 Trochanteric bursitis, left hip: Secondary | ICD-10-CM | POA: Diagnosis not present

## 2018-04-06 ENCOUNTER — Ambulatory Visit (HOSPITAL_BASED_OUTPATIENT_CLINIC_OR_DEPARTMENT_OTHER)
Admission: RE | Admit: 2018-04-06 | Discharge: 2018-04-06 | Disposition: A | Discharge status: Home / Self Care | Payer: Medicare Other | Source: Ambulatory Visit | Attending: Family Medicine | Admitting: Family Medicine

## 2018-04-06 ENCOUNTER — Inpatient Hospital Stay: Payer: Medicare Other | Attending: Hematology & Oncology | Admitting: Hematology & Oncology

## 2018-04-06 ENCOUNTER — Encounter (HOSPITAL_BASED_OUTPATIENT_CLINIC_OR_DEPARTMENT_OTHER): Payer: Self-pay

## 2018-04-06 ENCOUNTER — Encounter: Payer: Self-pay | Admitting: Hematology & Oncology

## 2018-04-06 ENCOUNTER — Inpatient Hospital Stay: Payer: Medicare Other

## 2018-04-06 ENCOUNTER — Other Ambulatory Visit: Payer: Self-pay

## 2018-04-06 VITALS — BP 152/76 | HR 58 | Temp 98.3°F | Resp 16 | Wt 177.0 lb

## 2018-04-06 DIAGNOSIS — E2839 Other primary ovarian failure: Secondary | ICD-10-CM | POA: Diagnosis not present

## 2018-04-06 DIAGNOSIS — M1990 Unspecified osteoarthritis, unspecified site: Secondary | ICD-10-CM | POA: Diagnosis not present

## 2018-04-06 DIAGNOSIS — D472 Monoclonal gammopathy: Secondary | ICD-10-CM

## 2018-04-06 DIAGNOSIS — Z1239 Encounter for other screening for malignant neoplasm of breast: Secondary | ICD-10-CM

## 2018-04-06 DIAGNOSIS — E611 Iron deficiency: Secondary | ICD-10-CM | POA: Diagnosis not present

## 2018-04-06 DIAGNOSIS — M85851 Other specified disorders of bone density and structure, right thigh: Secondary | ICD-10-CM | POA: Diagnosis not present

## 2018-04-06 DIAGNOSIS — Z1231 Encounter for screening mammogram for malignant neoplasm of breast: Secondary | ICD-10-CM | POA: Insufficient documentation

## 2018-04-06 DIAGNOSIS — Z13820 Encounter for screening for osteoporosis: Secondary | ICD-10-CM | POA: Insufficient documentation

## 2018-04-06 DIAGNOSIS — D500 Iron deficiency anemia secondary to blood loss (chronic): Secondary | ICD-10-CM

## 2018-04-06 DIAGNOSIS — D510 Vitamin B12 deficiency anemia due to intrinsic factor deficiency: Secondary | ICD-10-CM

## 2018-04-06 DIAGNOSIS — Z780 Asymptomatic menopausal state: Secondary | ICD-10-CM | POA: Diagnosis not present

## 2018-04-06 DIAGNOSIS — M85832 Other specified disorders of bone density and structure, left forearm: Secondary | ICD-10-CM | POA: Diagnosis not present

## 2018-04-06 DIAGNOSIS — M8589 Other specified disorders of bone density and structure, multiple sites: Secondary | ICD-10-CM | POA: Diagnosis not present

## 2018-04-06 HISTORY — PX: OTHER SURGICAL HISTORY: SHX169

## 2018-04-06 LAB — CBC WITH DIFFERENTIAL (CANCER CENTER ONLY)
BASOS PCT: 1 %
Basophils Absolute: 0 10*3/uL (ref 0.0–0.1)
EOS ABS: 0.3 10*3/uL (ref 0.0–0.5)
EOS PCT: 4 %
HCT: 38.8 % (ref 34.8–46.6)
HEMOGLOBIN: 12.9 g/dL (ref 11.6–15.9)
LYMPHS ABS: 1.3 10*3/uL (ref 0.9–3.3)
Lymphocytes Relative: 21 %
MCH: 29 pg (ref 26.0–34.0)
MCHC: 33.2 g/dL (ref 32.0–36.0)
MCV: 87.2 fL (ref 81.0–101.0)
Monocytes Absolute: 0.3 10*3/uL (ref 0.1–0.9)
Monocytes Relative: 6 %
Neutro Abs: 4.3 10*3/uL (ref 1.5–6.5)
Neutrophils Relative %: 68 %
PLATELETS: 186 10*3/uL (ref 145–400)
RBC: 4.45 MIL/uL (ref 3.70–5.32)
RDW: 12.6 % (ref 11.1–15.7)
WBC Count: 6.2 10*3/uL (ref 3.9–10.0)

## 2018-04-06 LAB — CMP (CANCER CENTER ONLY)
ALBUMIN: 3.9 g/dL (ref 3.5–5.0)
ALK PHOS: 114 U/L (ref 38–126)
ALT: 12 U/L (ref 0–44)
ANION GAP: 8 (ref 5–15)
AST: 16 U/L (ref 15–41)
BUN: 10 mg/dL (ref 8–23)
CALCIUM: 9.1 mg/dL (ref 8.9–10.3)
CO2: 26 mmol/L (ref 22–32)
Chloride: 106 mmol/L (ref 98–111)
Creatinine: 0.82 mg/dL (ref 0.44–1.00)
GFR, Estimated: >60 mL/min (ref >60)
GLUCOSE: 102 mg/dL — AB (ref 70–99)
Potassium: 3.7 mmol/L (ref 3.5–5.1)
SODIUM: 140 mmol/L (ref 135–145)
Total Bilirubin: 0.6 mg/dL (ref 0.3–1.2)
Total Protein: 7.4 g/dL (ref 6.5–8.1)

## 2018-04-06 LAB — VITAMIN B12: VITAMIN B 12: 310 pg/mL (ref 180–914)

## 2018-04-06 NOTE — Progress Notes (Signed)
Hematology and Oncology Follow Up Visit  [name] [id number] 06/16/1946 72 y.o. 04/06/2018   Principle Diagnosis:  1. IgM kappa monoclonal gammopathy of undetermined significance 2. Intermittent iron deficiency anemia  Current Therapy:   IV iron as indicated - last received in April 2017   Interim History:  [name] is here today with her friend for follow-up.  We last saw her back in January.  She still has a lot of pain issues because of arthritis.  She has knee issues.  She has back and hip issues.  She sees a pain management doctor.  She will be getting some injections into her hips.  Her last M spike was 0.6 g/dL in July.  Her IgM level was 559 mg/dL.  Her kappa light chain was 1.3 mg/dL.  She might need cataract surgery for her right eye..  She has had no bleeding.  There is been no change in bowel or bladder habits.  She has had no issues with nausea or vomiting.  Her weight goes up.  She ate quite well over the holidays.   ECOG Performance Status: 1 - Symptomatic but completely ambulatory  Medications:  Allergies as of 04/06/2018      Reactions   Cymbalta [duloxetine Hcl]    Facial edema   Iohexol     Code: HIVES, Desc: pt states her face and throat swells when administered IV contrast - KB, Onset Date: [id number]   Mepergan [meperidine-promethazine]    Cardiac arrest   Morphine Rash   Augmentin [amoxicillin-pot Clavulanate] Nausea Only   Citalopram Other (See Comments)   Jittery/heart racing   Hctz [hydrochlorothiazide] Other (See Comments)   Too much urinating, felt dried out, etc--NO ALLERGY   Lexapro [escitalopram Oxalate] Other (See Comments)   Jittery,heart racing   Lyrica [pregabalin] Swelling   Face, hands, fingers, ankles   Valsartan    REACTION: pt states INTOL to Diovan      Medication List        Accurate as of 04/06/18  1:18 PM. Always use your most recent med list.          acetaminophen 325 MG tablet Commonly known as:   TYLENOL Take 650 mg by mouth every 6 (six) hours as needed.   ALIGN 4 MG Caps Take 1 capsule (4 mg total) by mouth daily.   ALPRAZolam 0.5 MG tablet Commonly known as:  XANAX TAKE ONE-HALF TO ONE TABLET BY MOUTH THREE TIMES DAILY   amoxicillin 500 MG capsule Commonly known as:  AMOXIL 4 tabs po x 1 dose, one hour prior to dental procedure   Azelastine HCl 0.15 % Soln Commonly known as:  ASTEPRO 2 sprays each nostril every 12 hours   calcium carbonate 1500 (600 Ca) MG Tabs tablet Commonly known as:  OSCAL Take by mouth daily.   cyclobenzaprine 5 MG tablet Commonly known as:  FLEXERIL as needed.   dicyclomine 20 MG tablet Commonly known as:  BENTYL Take 1 tablet (20 mg total) by mouth 2 (two) times daily.   diphenhydrAMINE 25 MG tablet Commonly known as:  BENADRYL Take 25 mg by mouth at bedtime as needed.   hydrocortisone 2.5 % cream Apply 1 application topically as needed. RECTAL ITCHING   levothyroxine 75 MCG tablet Commonly known as:  SYNTHROID, LEVOTHROID TAKE 1/2 (ONE-HALF) TABLET BY MOUTH ONCE DAILY   magnesium (amino acid chelate) 133 MG tablet Take 1 tablet by mouth daily.   meclizine 25 MG tablet Commonly known as:  ANTIVERT  Take 1 tablet (25 mg total) by mouth every 6 (six) hours as needed for dizziness.   MELATONIN PO Take by mouth.   meloxicam 7.5 MG tablet Commonly known as:  MOBIC Take 2 tablets (15 mg total) by mouth daily.   metoprolol tartrate 25 MG tablet Commonly known as:  LOPRESSOR TAKE ONE TABLET BY MOUTH ONCE DAILY   MULTIVITAMIN/IRON PO Take 1 tablet by mouth daily.   NIACIN PO Take by mouth.   omeprazole 40 MG capsule Commonly known as:  PRILOSEC Take 1 capsule (40 mg total) by mouth daily.   Oxycodone HCl 10 MG Tabs 10 mg every 4 (four) hours as needed. 1 every 6 hours for pain   promethazine 25 MG tablet Commonly known as:  PHENERGAN TAKE ONE TABLET BY MOUTH EVERY 6 HOURS AS NEEDED FOR NAUSEA AND VOMITING   RED YEAST  RICE PO Take by mouth.   traZODone 50 MG tablet Commonly known as:  DESYREL Take 1 tablet by mouth at bedtime.   Vitamin D 2000 units Caps Take 5 capsules by mouth daily.       Allergies:  Allergies  Allergen Reactions  . Cymbalta [Duloxetine Hcl]     Facial edema  . Iohexol      Code: HIVES, Desc: pt states her face and throat swells when administered IV contrast - KB, Onset Date: [id number]   . Mepergan [Meperidine-Promethazine]     Cardiac arrest  . Morphine Rash  . Augmentin [Amoxicillin-Pot Clavulanate] Nausea Only  . Citalopram Other (See Comments)    Jittery/heart racing   . Hctz [Hydrochlorothiazide] Other (See Comments)    Too much urinating, felt dried out, etc--NO ALLERGY  . Lexapro [Escitalopram Oxalate] Other (See Comments)    Jittery,heart racing  . Lyrica [Pregabalin] Swelling    Face, hands, fingers, ankles  . Valsartan     REACTION: pt states INTOL to Diovan    Past Medical History, Surgical history, Social history, and Family History were reviewed and updated.  Review of Systems: Review of Systems  Constitutional: Positive for malaise/fatigue.  Eyes: Positive for blurred vision.  Respiratory: Positive for wheezing.   Cardiovascular: Positive for palpitations.  Gastrointestinal: Positive for nausea.  Genitourinary: Positive for urgency.  Musculoskeletal: Positive for back pain, joint pain and myalgias.  Neurological: Positive for tingling.  Endo/Heme/Allergies: Negative.   Psychiatric/Behavioral: Negative.      Physical Exam:  weight is 177 lb (80.3 kg). Her oral temperature is 98.3 F (36.8 C). Her blood pressure is 152/76 (abnormal) and her pulse is 58 (abnormal). Her respiration is 16 and oxygen saturation is 96%.   Wt Readings from Last 3 Encounters:  04/06/18 177 lb (80.3 kg)  03/27/18 172 lb (78 kg)  02/27/18 178 lb 4 oz (80.9 kg)    Physical Exam  Constitutional: She is oriented to person, place, and time.  HENT:  Head:  Normocephalic and atraumatic.  Mouth/Throat: Oropharynx is clear and moist.  Eyes: Pupils are equal, round, and reactive to light. EOM are normal.  Neck: Normal range of motion.  Cardiovascular: Normal rate, regular rhythm and normal heart sounds.  Pulmonary/Chest: Effort normal and breath sounds normal.  Abdominal: Soft. Bowel sounds are normal.  Musculoskeletal: Normal range of motion. She exhibits no edema, tenderness or deformity.  Lymphadenopathy:    She has no cervical adenopathy.  Neurological: She is alert and oriented to person, place, and time.  Skin: Skin is warm and dry. No rash noted. No erythema.  Psychiatric: She has  a normal mood and affect. Her behavior is normal. Judgment and thought content normal.  Vitals reviewed.   Lab Results  Component Value Date   WBC 6.2 04/06/2018   HGB 12.9 04/06/2018   HCT 38.8 04/06/2018   MCV 87.2 04/06/2018   PLT 186 04/06/2018   Lab Results  Component Value Date   FERRITIN 134 10/06/2017   IRON 75 10/06/2017   TIBC 289 10/06/2017   UIBC 214 10/06/2017   IRONPCTSAT 26 10/06/2017   Lab Results  Component Value Date   RETICCTPCT 1.2 01/20/2011   RBC 4.45 04/06/2018   RETICCTABS 52.2 01/20/2011   Lab Results  Component Value Date   KPAFRELGTCHN 12.5 10/06/2017   LAMBDASER 18.6 10/06/2017   KAPLAMBRATIO 0.67 10/06/2017   Lab Results  Component Value Date   IGGSERUM 639 (L) 10/06/2017   IGA 244 10/06/2017   IGMSERUM 559 (H) 10/06/2017   Lab Results  Component Value Date   TOTALPROTELP 7.0 10/06/2017   ALBUMINELP 3.7 10/06/2017   A1GS 0.2 10/06/2017   A2GS 0.8 10/06/2017   BETS 1.1 10/06/2017   BETA2SER 0.5 06/19/2015   GAMS 1.1 10/06/2017   MSPIKE 0.6 (H) 10/06/2017   SPEI Comment 10/06/2017     Chemistry      Component Value Date/Time   NA 140 01/24/2018 0922   NA 138 03/25/2017 1333   NA 138 11/25/2016 1147   K 4.3 01/24/2018 0922   K 4.3 03/25/2017 1333   K 3.8 11/25/2016 1147   CL 104 01/24/2018  0922   CL 104 03/25/2017 1333   CL 103 01/15/2010 0835   CO2 30 01/24/2018 0922   CO2 24 03/25/2017 1333   CO2 25 11/25/2016 1147   BUN 14 01/24/2018 0922   BUN 12 03/25/2017 1333   BUN 15.3 11/25/2016 1147   CREATININE 0.94 01/24/2018 0922   CREATININE 1.00 10/06/2017 1429   CREATININE 0.76 03/25/2017 1333   CREATININE 0.8 11/25/2016 1147      Component Value Date/Time   CALCIUM 9.1 01/24/2018 0922   CALCIUM 9.5 03/25/2017 1333   CALCIUM 9.2 11/25/2016 1147   ALKPHOS 109 (H) 10/06/2017 1429   ALKPHOS 111 03/25/2017 1333   ALKPHOS 99 11/25/2016 1147   AST 18 10/06/2017 1429   AST 24 11/25/2016 1147   ALT 18 10/06/2017 1429   ALT 30 11/25/2016 1147   BILITOT 0.6 10/06/2017 1429   BILITOT 0.61 11/25/2016 1147      Impression and Plan: Ms. Degraffenreid is a very pleasant 72 yo caucasian female with MGUS and history of iron deficiency.  She has an IgM MGUS.  This we will have to watch closely.  We have been watching her for several years.  So far, we have not seen anything that shows progression to a lymphoplasmacytic lymphoma or myeloma.  We will go ahead and plan to get her back in 6 months.  It is always nice to talk with her.  [name], MD 7/18/20191:18 PM

## 2018-04-07 ENCOUNTER — Ambulatory Visit: Payer: Medicare Other | Admitting: Gynecology

## 2018-04-07 ENCOUNTER — Encounter: Payer: Self-pay | Admitting: Gynecology

## 2018-04-07 ENCOUNTER — Ambulatory Visit (INDEPENDENT_AMBULATORY_CARE_PROVIDER_SITE_OTHER): Payer: Medicare Other | Admitting: Gynecology

## 2018-04-07 ENCOUNTER — Telehealth: Payer: Self-pay | Admitting: *Deleted

## 2018-04-07 VITALS — BP 120/78

## 2018-04-07 DIAGNOSIS — R350 Frequency of micturition: Secondary | ICD-10-CM | POA: Diagnosis not present

## 2018-04-07 DIAGNOSIS — N898 Other specified noninflammatory disorders of vagina: Secondary | ICD-10-CM

## 2018-04-07 DIAGNOSIS — M47896 Other spondylosis, lumbar region: Secondary | ICD-10-CM | POA: Diagnosis not present

## 2018-04-07 DIAGNOSIS — M1712 Unilateral primary osteoarthritis, left knee: Secondary | ICD-10-CM | POA: Diagnosis not present

## 2018-04-07 DIAGNOSIS — M7061 Trochanteric bursitis, right hip: Secondary | ICD-10-CM | POA: Diagnosis not present

## 2018-04-07 DIAGNOSIS — M7062 Trochanteric bursitis, left hip: Secondary | ICD-10-CM | POA: Diagnosis not present

## 2018-04-07 LAB — IRON AND TIBC
IRON: 31 ug/dL — AB (ref 41–142)
Saturation Ratios: 11 % — ABNORMAL LOW (ref 21–57)
TIBC: 284 ug/dL (ref 236–444)
UIBC: 253 ug/dL

## 2018-04-07 LAB — IGG, IGA, IGM
IGA: 254 mg/dL (ref 64–422)
IGG (IMMUNOGLOBIN G), SERUM: 595 mg/dL — AB (ref 700–1600)
IgM (Immunoglobulin M), Srm: 589 mg/dL — ABNORMAL HIGH (ref 26–217)

## 2018-04-07 LAB — FERRITIN: Ferritin: 143 ng/mL (ref 11–307)

## 2018-04-07 LAB — KAPPA/LAMBDA LIGHT CHAINS
KAPPA FREE LGHT CHN: 17.3 mg/L (ref 3.3–19.4)
Kappa, lambda light chain ratio: 1.04 (ref 0.26–1.65)
Lambda free light chains: 16.6 mg/L (ref 5.7–26.3)

## 2018-04-07 LAB — WET PREP FOR TRICH, YEAST, CLUE

## 2018-04-07 MED ORDER — FLUCONAZOLE 150 MG PO TABS: 150.0000 mg | ORAL_TABLET | Freq: Once | ORAL | 0 refills | Status: AC

## 2018-04-07 NOTE — Telephone Encounter (Signed)
Patient notified per order of [name] that iron is low and that she will need one dose of IV iron. Patient appreciative of call and has no questions at this time. Scheduling not available at this time, so message sent.

## 2018-04-07 NOTE — Progress Notes (Signed)
    [name] Sep 24, 1945 [id number]        72 y.o.  G0P0 presents with 1 week history of vaginal itching and some urinary frequency.  Not sure if it is her interstitial cystitis or not.  No significant discharge or odor.  No urgency low back pain fever or chills.  Past medical history,surgical history, problem list, medications, allergies, family history and social history were all reviewed and documented in the EPIC chart.  Directed ROS with pertinent positives and negatives documented in the history of present illness/assessment and plan.  Exam: Caryn Bee assistant Vitals:   04/07/18 1458  BP: 120/78   General appearance:  Normal Spine straight without CVA tenderness Abdomen soft nontender without masses guarding rebound Pelvic external BUS vagina with atrophic changes.  Scant discharge noted.  Bimanual without masses or tenderness.  Assessment/Plan:  72 y.o. G0P0 with vaginal itching.  No significant discharge or odor.  Some urinary frequency but urine analysis is completely negative consistent with interstitial cystitis causing her symptoms.  Will cover the itching with Diflucan 150 mg x 1 dose in the event of a low-grade yeast.  Patient will follow-up if her symptoms persist, worsen or recur.    Anastasio Auerbach MD, 3:46 PM 04/07/2018

## 2018-04-07 NOTE — Telephone Encounter (Signed)
-----   Message from [name], MD sent at 04/07/2018 10:21 AM EDT ----- Call - iron is actually low!!!   Needs 1 dose of IV iron!!  Please set up.  Laurey Arrow

## 2018-04-07 NOTE — Patient Instructions (Signed)
Take the one Diflucan pill.  Follow-up if your symptoms persist, worsen or recur. 

## 2018-04-08 LAB — URINALYSIS, COMPLETE W/RFL CULTURE
BACTERIA UA: NONE SEEN /HPF
Bilirubin Urine: NEGATIVE
Glucose, UA: NEGATIVE
HYALINE CAST: NONE SEEN /LPF
Hgb urine dipstick: NEGATIVE
Ketones, ur: NEGATIVE
Leukocyte Esterase: NEGATIVE
Nitrites, Initial: NEGATIVE
Protein, ur: NEGATIVE
RBC / HPF: NONE SEEN /HPF (ref 0–2)
Specific Gravity, Urine: 1.01 (ref 1.001–1.03)
WBC, UA: NONE SEEN /HPF (ref 0–5)
pH: 5 (ref 5.0–8.0)

## 2018-04-08 LAB — NO CULTURE INDICATED

## 2018-04-10 LAB — PROTEIN ELECTROPHORESIS, SERUM, WITH REFLEX
A/G RATIO SPE: 1 (ref 0.7–1.7)
Albumin ELP: 3.3 g/dL (ref 2.9–4.4)
Alpha-1-Globulin: 0.2 g/dL (ref 0.0–0.4)
Alpha-2-Globulin: 0.9 g/dL (ref 0.4–1.0)
BETA GLOBULIN: 1 g/dL (ref 0.7–1.3)
GLOBULIN, TOTAL: 3.4 g/dL (ref 2.2–3.9)
Gamma Globulin: 1.2 g/dL (ref 0.4–1.8)
M-Spike, %: 0.5 g/dL — ABNORMAL HIGH
SPEP INTERP: 0
Total Protein ELP: 6.7 g/dL (ref 6.0–8.5)

## 2018-04-10 LAB — IMMUNOFIXATION REFLEX, SERUM
IGM (IMMUNOGLOBULIN M), SRM: 630 mg/dL — AB (ref 26–217)
IgA: 287 mg/dL (ref 64–422)
IgG (Immunoglobin G), Serum: 667 mg/dL — ABNORMAL LOW (ref 700–1600)

## 2018-04-11 ENCOUNTER — Inpatient Hospital Stay: Payer: Medicare Other

## 2018-04-11 VITALS — BP 135/75 | HR 56 | Temp 98.0°F | Resp 16

## 2018-04-11 DIAGNOSIS — D500 Iron deficiency anemia secondary to blood loss (chronic): Secondary | ICD-10-CM

## 2018-04-11 DIAGNOSIS — D472 Monoclonal gammopathy: Secondary | ICD-10-CM | POA: Diagnosis not present

## 2018-04-11 DIAGNOSIS — E611 Iron deficiency: Secondary | ICD-10-CM | POA: Diagnosis not present

## 2018-04-11 DIAGNOSIS — M1990 Unspecified osteoarthritis, unspecified site: Secondary | ICD-10-CM | POA: Diagnosis not present

## 2018-04-11 MED ORDER — FERUMOXYTOL INJECTION 510 MG/17 ML
510.0000 mg | Freq: Once | INTRAVENOUS | Status: AC
  Administered 2018-04-11: 510 mg via INTRAVENOUS
  Filled 2018-04-11: qty 17

## 2018-04-11 MED ORDER — SODIUM CHLORIDE 0.9 % IV SOLN
Freq: Once | INTRAVENOUS | Status: AC
  Administered 2018-04-11: 11:00:00 via INTRAVENOUS

## 2018-04-11 NOTE — Patient Instructions (Signed)

## 2018-04-12 ENCOUNTER — Ambulatory Visit: Payer: Medicare Other

## 2018-04-14 DIAGNOSIS — M7061 Trochanteric bursitis, right hip: Secondary | ICD-10-CM | POA: Diagnosis not present

## 2018-04-14 DIAGNOSIS — M7062 Trochanteric bursitis, left hip: Secondary | ICD-10-CM | POA: Diagnosis not present

## 2018-04-14 DIAGNOSIS — M47896 Other spondylosis, lumbar region: Secondary | ICD-10-CM | POA: Diagnosis not present

## 2018-04-14 DIAGNOSIS — M1712 Unilateral primary osteoarthritis, left knee: Secondary | ICD-10-CM | POA: Diagnosis not present

## 2018-04-24 ENCOUNTER — Other Ambulatory Visit: Payer: Self-pay

## 2018-04-24 MED ORDER — METOPROLOL TARTRATE 25 MG PO TABS: 25.0000 mg | ORAL_TABLET | Freq: Every day | ORAL | 0 refills | Status: DC

## 2018-04-27 DIAGNOSIS — I10 Essential (primary) hypertension: Secondary | ICD-10-CM | POA: Diagnosis not present

## 2018-04-27 DIAGNOSIS — M48062 Spinal stenosis, lumbar region with neurogenic claudication: Secondary | ICD-10-CM | POA: Diagnosis not present

## 2018-04-27 DIAGNOSIS — Z6828 Body mass index (BMI) 28.0-28.9, adult: Secondary | ICD-10-CM | POA: Diagnosis not present

## 2018-05-01 ENCOUNTER — Ambulatory Visit (INDEPENDENT_AMBULATORY_CARE_PROVIDER_SITE_OTHER): Payer: Medicare Other | Admitting: Sports Medicine

## 2018-05-01 VITALS — BP 132/86 | Ht 66.0 in | Wt 174.0 lb

## 2018-05-01 DIAGNOSIS — M25579 Pain in unspecified ankle and joints of unspecified foot: Secondary | ICD-10-CM

## 2018-05-02 NOTE — Progress Notes (Signed)
   Subjective:    Patient ID: [name], female    DOB: [birth year], 72 y.o.   MRN: [id number]  HPI   Patient comes in today for follow-up. Unfortunately she has found the scaphoid pad that we added to her right orthotic to be uncomfortable. She removed the scaphoid pad which did help some. She is also scheduled to undergo epidural steroid injections for her chronic low back pain sometime in the near future. This was ordered by [name].    Review of Systems As above    Objective:   Physical Exam  Well developed, well-nourished. No acute distress.  Patient still shows significant pronation right greater than left, with ambulation. Right shoulder does drop with ambulation suggesting a shorter right leg. Please note that this gait analysis was performed without orthotics or arch support.      Assessment & Plan:  Pronation Leg length discrepancy  I've added a 5/16 inch heel lift to the orthotic for the shorter right leg. Although I did not appreciate a leg length discrepancy earlier, her gait does suggest that her right leg is shorter than the left. The lift did help some but was not a full correction. She said that it felt pretty comfortable. I would like for her to follow-up again in 4 weeks for reevaluation. I once again reiterated that I'm not sure how much of her chronic pain in her back and her knees will be resolved with orthotics.She understands.

## 2018-05-03 DIAGNOSIS — M1712 Unilateral primary osteoarthritis, left knee: Secondary | ICD-10-CM | POA: Diagnosis not present

## 2018-05-03 DIAGNOSIS — K5900 Constipation, unspecified: Secondary | ICD-10-CM | POA: Diagnosis not present

## 2018-05-03 DIAGNOSIS — M961 Postlaminectomy syndrome, not elsewhere classified: Secondary | ICD-10-CM | POA: Diagnosis not present

## 2018-05-03 DIAGNOSIS — M7061 Trochanteric bursitis, right hip: Secondary | ICD-10-CM | POA: Diagnosis not present

## 2018-05-03 DIAGNOSIS — M47896 Other spondylosis, lumbar region: Secondary | ICD-10-CM | POA: Diagnosis not present

## 2018-05-03 DIAGNOSIS — M7062 Trochanteric bursitis, left hip: Secondary | ICD-10-CM | POA: Diagnosis not present

## 2018-05-03 DIAGNOSIS — M62830 Muscle spasm of back: Secondary | ICD-10-CM | POA: Diagnosis not present

## 2018-05-03 DIAGNOSIS — G894 Chronic pain syndrome: Secondary | ICD-10-CM | POA: Diagnosis not present

## 2018-05-07 ENCOUNTER — Encounter: Payer: Self-pay | Admitting: Family Medicine

## 2018-05-12 ENCOUNTER — Telehealth: Payer: Self-pay | Admitting: *Deleted

## 2018-05-12 MED ORDER — FLUCONAZOLE 150 MG PO TABS: 150.0000 mg | ORAL_TABLET | Freq: Once | ORAL | 0 refills | Status: AC

## 2018-05-12 NOTE — Telephone Encounter (Signed)
Rx sent, pt aware 

## 2018-05-12 NOTE — Telephone Encounter (Signed)
Patient was treated with Diflucan 150 mg on OV 04/07/18, called asking she can have refill on medication c/o vaginal itching. Please advise

## 2018-05-12 NOTE — Telephone Encounter (Signed)
Assuming the symptoms totally resolved with the first Diflucan and then recurred then okay to refill x1.

## 2018-05-29 ENCOUNTER — Ambulatory Visit: Payer: Medicare Other | Admitting: Sports Medicine

## 2018-06-01 DIAGNOSIS — K5900 Constipation, unspecified: Secondary | ICD-10-CM | POA: Diagnosis not present

## 2018-06-01 DIAGNOSIS — M961 Postlaminectomy syndrome, not elsewhere classified: Secondary | ICD-10-CM | POA: Diagnosis not present

## 2018-06-01 DIAGNOSIS — G894 Chronic pain syndrome: Secondary | ICD-10-CM | POA: Diagnosis not present

## 2018-06-01 DIAGNOSIS — M62830 Muscle spasm of back: Secondary | ICD-10-CM | POA: Diagnosis not present

## 2018-06-12 DIAGNOSIS — M5417 Radiculopathy, lumbosacral region: Secondary | ICD-10-CM | POA: Diagnosis not present

## 2018-06-26 ENCOUNTER — Ambulatory Visit (INDEPENDENT_AMBULATORY_CARE_PROVIDER_SITE_OTHER): Payer: Medicare Other | Admitting: Family Medicine

## 2018-06-26 ENCOUNTER — Encounter: Payer: Self-pay | Admitting: Family Medicine

## 2018-06-26 ENCOUNTER — Ambulatory Visit
Admission: RE | Admit: 2018-06-26 | Discharge: 2018-06-26 | Disposition: A | Discharge status: Home / Self Care | Payer: Medicare Other | Source: Ambulatory Visit | Attending: Family Medicine | Admitting: Family Medicine

## 2018-06-26 VITALS — BP 132/76 | HR 56 | Temp 98.2°F | Resp 16 | Ht 66.0 in | Wt 173.2 lb

## 2018-06-26 DIAGNOSIS — R0781 Pleurodynia: Secondary | ICD-10-CM | POA: Diagnosis not present

## 2018-06-26 DIAGNOSIS — R0789 Other chest pain: Secondary | ICD-10-CM

## 2018-06-26 NOTE — Progress Notes (Signed)
OFFICE VISIT  06/26/2018   CC:  Chief Complaint  Patient presents with  . Abdominal Pain    RUQ, low grade fever 99.82F   HPI:    Patient is a 72 y.o. Caucasian female who presents accompanied by her husband for sharp, right sided, upper abdominal pain and lower rib pain. Onset yesterday afternoon pain under R rib cage that was sharp when she took a deep breath.  If sitting still and not taking a deep breath, then NO pain.  She has not tried eating anything since the pain came on. Felt chills right after it started, Tm 99.9.  She took tylenol.  Now still with a sharp pain in same areas every deep breath.  No cough.  Some nausea feeling "up in the throat area, not down low" but no vomiting.  No abd distention. No melena or hematochezia. No diarrhea.  Temp at home this morning normal.   No SOB.  No new pain in legs.   Takes meloxicam only rarely, most recent dose was 1 mo ago. She does take oxycodone chronically for chronic back pain and this has not changed.   Past Medical History:  Diagnosis Date  . Allergic rhinitis   . Anxiety   . Chest pain    cr  . Chronic gastritis    dx'd by EGD 2018 (erosive, likely NSAID-induced).  . Chronic pain syndrome    right knee osteo, hx of TKA in R knee.  Pt was told by [name] that no further surgical options are available for her--[name]  . Chronic renal insufficiency, stage II (mild) 2015   vs acute fluctuations depending on hydration?---GFR from 60s up to 80s.  . Diverticulosis of colon    with hx of 'itis  . DJD (degenerative joint disease)    L spine and knees.  R knee steroid injection 03/15/18 by Dr. Lynann Bologna.  [name] Epigastric pain    As of 01/2017, GI (Dr. Loletha Carrow) planned EGD.  [name] FATIGUE / MALAISE 07/01/2009   Chronic  . Fibromyalgia   . GERD (gastroesophageal reflux disease)   . History of colonic polyps   . HTN (hypertension)   . Hypercholesterolemia   . Hypothyroid 07/05/2012  . IBS (irritable bowel syndrome)    . IC (interstitial cystitis)    [name] at [place]  . Iron deficiency anemia, unspecified 06/27/2013   Has required IV iron on multiple occasions ([name]).  Hemoccults neg 12/2016.  . Lumbar back pain    Spinal stenosis, lumbar region with neurogenic claudication L5-S1 sx's--(to undergoe ESI's as of 04/2018 f/u with [name]).)  . Major depression, recurrent ([place])    vs dysthymia,chronic  . Memory impairment 10/03/2012   Memory impairment 10/03/2012 >referral to neuro    . Monoclonal gammopathy of undetermined significance    IgM kappa MGUS ([name]) routine f/u has shown stability of this problem.  . OSA (obstructive sleep apnea)    Latest sleep study 01/2017.  Some adjustments to CPAP being made+ dental referral for consideration of oral appliance --per pulm MD.  . Lebron Quam 04/2014; 03/2018   2015: T score -2.0 FRAX 11%/1.8%.  2019 FRAX 10%/1.5%.  . Otitis externa, candida 28/3662   [name] tx'd her with clotrimazole 1% external solution x 1 mo  . Palpitations   . Psoriasis   . Vitamin D deficiency   . Xerostomia    and xerophthalmia--per [name] (he suggests SSA, SSB, ESR, rh factor, ANA, and antimicrosmal and  antithyroglobulin ab's as of 02/17/16--all these labs were NORMAL.    Past Surgical History:  Procedure Laterality Date  . ABDOMINAL HYSTERECTOMY     for prolapse with abdominal incision  . BACK SURGERY  01/23/2015   Lumbar decompression with L2-L5 fusion ([name])  . CARDIAC CATHETERIZATION     no PCI  . CHOLECYSTECTOMY    . COLONOSCOPY  04/2006   Diverticulosis, o/w normal.  . DEXA  04/06/2018   T score -1.5 femur neck, -2.2 radius.  FRAX 10%/1.5%.  Repeat DEXA 2 yrs.  . ESOPHAGOGASTRODUODENOSCOPY  05/29/2009; 03/01/17   Esoph normal.  Gastritis (H pylori NEG) + gastric polyps (benign).  Duodenal biopsy NORMAL.  2018-mild chronic gastritis, H pylori NEG.  . EYE SURGERY Left    "lazy eye"  . HAND SURGERY Right    TRIGGER FINGER; index finger   . HERNIA REPAIR     umbilical  . KNEE ARTHROSCOPY     RIGHT KNEE X 5  . LAMINOTOMY  2010   L-4, L-5 FUSION  . NASAL SINUS SURGERY     polypectomy (remote past)  . OOPHORECTOMY  2010   Laparoscopic BSO (path benign)  . PATELLA FRACTURE SURGERY  10-08   [name]  . POSTERIOR LAMINECTOMY / DECOMPRESSION LUMBAR SPINE  9-09   [name]  . TOTAL KNEE ARTHROPLASTY  12-07 and 4-07   [name], revision 12-09 [name] at [facility]    Outpatient Medications Prior to Visit  Medication Sig Dispense Refill  . acetaminophen (TYLENOL) 325 MG tablet Take 650 mg by mouth every 6 (six) hours as needed.    . ALPRAZolam (XANAX) 0.5 MG tablet TAKE ONE-HALF TO ONE TABLET BY MOUTH THREE TIMES DAILY 270 tablet 1  . Azelastine HCl (ASTEPRO) 0.15 % SOLN 2 sprays each nostril every 12 hours (Patient taking differently: Place 2 sprays into the nose 2 (two) times daily as needed (Allergies). 2 sprays each nostril every 12 hours) 30 mL 11  . calcium carbonate (OSCAL) 1500 (600 Ca) MG TABS tablet Take by mouth daily.    . Cholecalciferol (VITAMIN D3) 5000 units CAPS Take 2 capsules by mouth daily.    [name] dicyclomine (BENTYL) 20 MG tablet Take 1 tablet (20 mg total) by mouth 2 (two) times daily. 60 tablet 3  . diphenhydrAMINE (BENADRYL) 25 MG tablet Take 25 mg by mouth at bedtime as needed.    . hydrocortisone 2.5 % cream Apply 1 application topically as needed. RECTAL ITCHING    . levothyroxine (SYNTHROID, LEVOTHROID) 75 MCG tablet TAKE 1/2 (ONE-HALF) TABLET BY MOUTH ONCE DAILY 45 tablet 3  . meclizine (ANTIVERT) 25 MG tablet Take 1 tablet (25 mg total) by mouth every 6 (six) hours as needed for dizziness. 360 tablet 3  . MELATONIN PO Take by mouth.    . meloxicam (MOBIC) 7.5 MG tablet Take 2 tablets (15 mg total) by mouth daily. 180 tablet 1  . metoprolol tartrate (LOPRESSOR) 25 MG tablet Take 1 tablet (25 mg total) by mouth daily. 90 tablet 0  . Multiple Vitamins-Iron (MULTIVITAMIN/IRON PO) Take 1 tablet by mouth  daily.     [name] NIACIN PO Take by mouth.    [name] omeprazole (PRILOSEC) 40 MG capsule Take 1 capsule (40 mg total) by mouth daily. 30 capsule 3  . Oxycodone HCl 10 MG TABS 10 mg every 4 (four) hours as needed. 1 every 6 hours for pain    . Probiotic Product (ALIGN) 4 MG CAPS Take 1 capsule (4  mg total) by mouth daily. 60 capsule 0  . promethazine (PHENERGAN) 25 MG tablet TAKE ONE TABLET BY MOUTH EVERY 6 HOURS AS NEEDED FOR NAUSEA AND VOMITING 90 tablet 3  . Red Yeast Rice Extract (RED YEAST RICE PO) Take by mouth.    [name] Specialty Vitamins Products (MAGNESIUM, AMINO ACID CHELATE,) 133 MG tablet Take 1 tablet by mouth daily.    . traZODone (DESYREL) 50 MG tablet Take 1 tablet by mouth at bedtime.  2  . cholecalciferol (VITAMIN D) 1000 units tablet Take 5 capsules by mouth daily.     . cyclobenzaprine (FLEXERIL) 5 MG tablet as needed.      No facility-administered medications prior to visit.     Allergies  Allergen Reactions  . Cymbalta [Duloxetine Hcl]     Facial edema  . Iohexol      Code: HIVES, Desc: pt states her face and throat swells when administered IV contrast - KB, Onset Date: [id number]   . Mepergan [Meperidine-Promethazine]     Cardiac arrest  . Morphine Rash  . Augmentin [Amoxicillin-Pot Clavulanate] Nausea Only  . Citalopram Other (See Comments)    Jittery/heart racing   . Hctz [Hydrochlorothiazide] Other (See Comments)    Too much urinating, felt dried out, etc--NO ALLERGY  . Lexapro [Escitalopram Oxalate] Other (See Comments)    Jittery,heart racing  . Lyrica [Pregabalin] Swelling    Face, hands, fingers, ankles  . Valsartan     REACTION: pt states INTOL to Diovan    ROS As per HPI  PE: Blood pressure 132/76, pulse (!) 56, temperature 98.2 F (36.8 C), temperature source Oral, resp. rate 16, height _0  (1.676 m), weight 173 lb 4 oz (78.6 kg), SpO2 96 %. Gen: Alert, well appearing.  Patient is oriented to person, place, time, and situation. AFFECT: pleasant, lucid  thought and speech. FXT:KWIO: no injection, icteris, swelling, or exudate.  EOMI, PERRLA. Mouth: lips without lesion/swelling.  Oral mucosa pink and moist. Oropharynx without erythema, exudate, or swelling.  CV: RRR, no m/r/g.   LUNGS: CTA bilat, nonlabored resps, good aeration in all lung fields. Abd: soft, non distended.  TTP in RUQ in subcostal region diffusely in anterior portion.  No mass.  Also with signif TTP over lower anterior ribs region on R.  LABS:  Lab Results  Component Value Date   WBC 6.2 04/06/2018   HGB 12.9 04/06/2018   HCT 38.8 04/06/2018   MCV 87.2 04/06/2018   PLT 186 04/06/2018     Chemistry      Component Value Date/Time   NA 140 04/06/2018 1145   NA 138 03/25/2017 1333   NA 138 11/25/2016 1147   K 3.7 04/06/2018 1145   K 4.3 03/25/2017 1333   K 3.8 11/25/2016 1147   CL 106 04/06/2018 1145   CL 104 03/25/2017 1333   CL 103 01/15/2010 0835   CO2 26 04/06/2018 1145   CO2 24 03/25/2017 1333   CO2 25 11/25/2016 1147   BUN 10 04/06/2018 1145   BUN 12 03/25/2017 1333   BUN 15.3 11/25/2016 1147   CREATININE 0.82 04/06/2018 1145   CREATININE 0.76 03/25/2017 1333   CREATININE 0.8 11/25/2016 1147      Component Value Date/Time   CALCIUM 9.1 04/06/2018 1145   CALCIUM 9.5 03/25/2017 1333   CALCIUM 9.2 11/25/2016 1147   ALKPHOS 114 04/06/2018 1145   ALKPHOS 111 03/25/2017 1333   ALKPHOS 99 11/25/2016 1147   AST 16 04/06/2018 1145   AST  24 11/25/2016 1147   ALT 12 04/06/2018 1145   ALT 30 11/25/2016 1147   BILITOT 0.6 04/06/2018 1145   BILITOT 0.61 11/25/2016 1147       IMPRESSION AND PLAN:  1) Pleuritic chest and RUQ pain.  Suspect pleurisy but also has signs of chest wall inflammation on exam (palpable tenderness). Low suspicion of bacterial infection, PE, or pneumothorax or ACS. Low suspicion of biliary tract etiology or gastritis. Could be early shingles, but I would expect the pain to be more constant at this point.  Plan: check CXR  today. Start bland diet. Take meloxicam 59m qd x 7d with food.  An After Visit Summary was printed and given to the patient.  FOLLOW UP: Return if symptoms worsen or fail to improve in 7-10 days.  Signed:  PCrissie Sickles MD           06/26/2018

## 2018-06-26 NOTE — Patient Instructions (Signed)
After you get some food in your stomach, start taking TWO of your 7.5mg  meloxicam tabs once a day for 7 days.   Bland Diet A bland diet consists of foods that do not have a lot of fat or fiber. Foods without fat or fiber are easier for the body to digest. They are also less likely to irritate your mouth, throat, stomach, and other parts of your gastrointestinal tract. A bland diet is sometimes called a BRAT diet. What is my plan? Your health care provider or dietitian may recommend specific changes to your diet to prevent and treat your symptoms, such as:  Eating small meals often.  Cooking food until it is soft enough to chew easily.  Chewing your food well.  Drinking fluids slowly.  Not eating foods that are very spicy, sour, or fatty.  Not eating citrus fruits, such as oranges and grapefruit.  What do I need to know about this diet?  Eat a variety of foods from the bland diet food list.  Do not follow a bland diet longer than you have to.  Ask your health care provider whether you should take vitamins. What foods can I eat? Grains  Hot cereals, such as cream of wheat. Bread, crackers, or tortillas made from refined white flour. Rice. Vegetables Canned or cooked vegetables. Mashed or boiled potatoes. Fruits Bananas. Applesauce. Other types of cooked or canned fruit with the skin and seeds removed, such as canned peaches or pears. Meats and Other Protein Sources Scrambled eggs. Creamy peanut butter or other nut butters. Lean, well-cooked meats, such as chicken or fish. Tofu. Soups or broths. Dairy Low-fat dairy products, such as milk, cottage cheese, or yogurt. Beverages Water. Herbal tea. Apple juice. Sweets and Desserts Pudding. Custard. Fruit gelatin. Ice cream. Fats and Oils Mild salad dressings. Canola or olive oil. The items listed above may not be a complete list of allowed foods or beverages. Contact your dietitian for more options. What foods are not  recommended? Foods and ingredients that are often not recommended include:  Spicy foods, such as hot sauce or salsa.  Fried foods.  Sour foods, such as pickled or fermented foods.  Raw vegetables or fruits, especially citrus or berries.  Caffeinated drinks.  Alcohol.  Strongly flavored seasonings or condiments.  The items listed above may not be a complete list of foods and beverages that are not allowed. Contact your dietitian for more information. This information is not intended to replace advice given to you by your health care provider. Make sure you discuss any questions you have with your health care provider. Document Released: 12/29/2015 Document Revised: 02/12/2016 Document Reviewed: 09/18/2014 Elsevier Interactive Patient Education  2018 [employer].

## 2018-06-27 ENCOUNTER — Encounter: Payer: Self-pay | Admitting: *Deleted

## 2018-06-30 DIAGNOSIS — K5900 Constipation, unspecified: Secondary | ICD-10-CM | POA: Diagnosis not present

## 2018-06-30 DIAGNOSIS — M961 Postlaminectomy syndrome, not elsewhere classified: Secondary | ICD-10-CM | POA: Diagnosis not present

## 2018-06-30 DIAGNOSIS — G894 Chronic pain syndrome: Secondary | ICD-10-CM | POA: Diagnosis not present

## 2018-06-30 DIAGNOSIS — M62830 Muscle spasm of back: Secondary | ICD-10-CM | POA: Diagnosis not present

## 2018-07-04 ENCOUNTER — Telehealth: Payer: Self-pay | Admitting: Family Medicine

## 2018-07-04 NOTE — Telephone Encounter (Signed)
Please advise. Thanks.  

## 2018-07-04 NOTE — Telephone Encounter (Signed)
Copied from [place] [id number]. Topic: Quick Communication - See Telephone Encounter >> Jul 04, 2018 11:21 AM Conception Chancy, NT wrote: CRM for notification. See Telephone encounter for: 07/04/18.  Patient is calling and states she was seen on 06/26/18 for pain under her breast. She states she pain has now moved down to her lower stomach and that it comes and goes. She would like to know can she get a scan, ultrasound or anything that Dr. [name] suggest.

## 2018-07-04 NOTE — Telephone Encounter (Signed)
I would need to re-evaluate her in the office before deciding what the next appropriate step would be.-thx

## 2018-07-05 NOTE — Telephone Encounter (Signed)
Pt advised, she stated that she is feeling better today and will call back to schedule an apt if she starts to fill bad again.

## 2018-07-17 ENCOUNTER — Other Ambulatory Visit: Payer: Self-pay | Admitting: Family Medicine

## 2018-07-24 ENCOUNTER — Ambulatory Visit (INDEPENDENT_AMBULATORY_CARE_PROVIDER_SITE_OTHER): Payer: Medicare Other | Admitting: Family Medicine

## 2018-07-24 ENCOUNTER — Other Ambulatory Visit: Payer: Self-pay

## 2018-07-24 ENCOUNTER — Encounter: Payer: Self-pay | Admitting: Family Medicine

## 2018-07-24 ENCOUNTER — Encounter: Payer: Self-pay | Admitting: *Deleted

## 2018-07-24 VITALS — BP 145/80 | HR 52 | Temp 98.3°F | Resp 16 | Ht 66.0 in | Wt 174.5 lb

## 2018-07-24 DIAGNOSIS — R5382 Chronic fatigue, unspecified: Secondary | ICD-10-CM | POA: Diagnosis not present

## 2018-07-24 DIAGNOSIS — F411 Generalized anxiety disorder: Secondary | ICD-10-CM | POA: Diagnosis not present

## 2018-07-24 DIAGNOSIS — M797 Fibromyalgia: Secondary | ICD-10-CM | POA: Diagnosis not present

## 2018-07-24 DIAGNOSIS — G9332 Myalgic encephalomyelitis/chronic fatigue syndrome: Secondary | ICD-10-CM

## 2018-07-24 DIAGNOSIS — E039 Hypothyroidism, unspecified: Secondary | ICD-10-CM

## 2018-07-24 DIAGNOSIS — E663 Overweight: Secondary | ICD-10-CM | POA: Diagnosis not present

## 2018-07-24 DIAGNOSIS — E7800 Pure hypercholesterolemia, unspecified: Secondary | ICD-10-CM

## 2018-07-24 DIAGNOSIS — I10 Essential (primary) hypertension: Secondary | ICD-10-CM | POA: Diagnosis not present

## 2018-07-24 DIAGNOSIS — Z23 Encounter for immunization: Secondary | ICD-10-CM

## 2018-07-24 DIAGNOSIS — F341 Dysthymic disorder: Secondary | ICD-10-CM

## 2018-07-24 LAB — LIPID PANEL
CHOL/HDL RATIO: 7
CHOLESTEROL: 226 mg/dL — AB (ref 0–200)
HDL: 30.5 mg/dL — ABNORMAL LOW (ref >39.00)
NonHDL: 195.65
TRIGLYCERIDES: 289 mg/dL — AB (ref 0.0–149.0)
VLDL: 57.8 mg/dL — AB (ref 0.0–40.0)

## 2018-07-24 LAB — LDL CHOLESTEROL, DIRECT: Direct LDL: 159 mg/dL

## 2018-07-24 MED ORDER — METOPROLOL TARTRATE 25 MG PO TABS: 25.0000 mg | ORAL_TABLET | Freq: Every day | ORAL | 1 refills | Status: DC

## 2018-07-24 NOTE — Progress Notes (Signed)
OFFICE VISIT  07/30/2018   CC:  Chief Complaint  Patient presents with  . Follow-up    RCI, pt is not fasting.    HPI:    Patient is a 72 y.o. Caucasian female who presents for 6 mo f/u HTN,  hypothyroidism, fibromyalgia, HLD, dysthymia with anxiety.  Hypoth: taking med correctly, TSH 6 mo ago was good; we've been on an annual TSH monitoring schedule.  HLD: taking red yeast rice extract.  She has fasted for the last 8 hours, plan is to repeat FLP today.  She is willing to get back on simva IF chol "VERY HIGH".  Dysthymia/anxiety:  Has a lot of anxiety, mainly due to worry about family members (her sister [place], esp). She takes xanax at bedtime to help anxiety related sleep problems and her pain md recently added trazodone. She does not want to try anything new for this right now:   Fibromyalgia and chronic fatigue syndrome: her mid and upper back is where she is usually symptomatic with this, but she says this has not been bothering her lately at all. Still suffering from R knee pain and lumbar back pain: got injections in her back a few weeks back, has neurosurgeon following her ([name]), but she does NOT want to resort to surgery.  HTN: home monitoring shows <120/80 consistently-->she has not taken her metoprolol yet today (she does take this med EVERY DAY for HTN, not prn for palpitations).   Past Medical History:  Diagnosis Date  . Allergic rhinitis   . Anxiety   . Chest pain    cr  . Chronic gastritis    dx'd by EGD 2018 (erosive, likely NSAID-induced).  . Chronic pain syndrome    right knee osteo, hx of TKA in R knee.  Pt was told by [name] that no further surgical options are available for her--[name]  . Chronic renal insufficiency, stage II (mild) 2015   vs acute fluctuations depending on hydration?---GFR from 60s up to 80s.  . Diverticulosis of colon    with hx of 'itis  . DJD (degenerative joint disease)    L spine and knees.  R knee  steroid injection 03/15/18 by Dr. Lynann Bologna.  [name] Epigastric pain    As of 01/2017, GI (Dr. Loletha Carrow) planned EGD.  [name] FATIGUE / MALAISE 07/01/2009   Chronic  . Fibromyalgia   . GERD (gastroesophageal reflux disease)   . History of colonic polyps   . HTN (hypertension)   . Hypercholesterolemia    recommended restart of simvastatin 07/2018  . Hypothyroid 07/05/2012  . IBS (irritable bowel syndrome)   . IC (interstitial cystitis)    [name] at [place]  . Iron deficiency anemia, unspecified 06/27/2013   Has required IV iron on multiple occasions ([name]).  Hemoccults neg 12/2016.  . Lumbar back pain    Spinal stenosis, lumbar region with neurogenic claudication L5-S1 sx's--(to undergoe ESI's as of 04/2018 f/u with [name]).)  . Major depression, recurrent ([place])    vs dysthymia,chronic  . Memory impairment 10/03/2012   Memory impairment 10/03/2012 >referral to neuro    . Monoclonal gammopathy of undetermined significance    IgM kappa MGUS ([name]) routine f/u has shown stability of this problem.  . OSA (obstructive sleep apnea)    Latest sleep study 01/2017.  Some adjustments to CPAP being made+ dental referral for consideration of oral appliance --per pulm MD.  . Lebron Quam 04/2014; 03/2018   2015: T score -2.0  FRAX 11%/1.8%.  2019 FRAX 10%/1.5%.  . Otitis externa, candida 54/6270   [name] tx'd her with clotrimazole 1% external solution x 1 mo  . Palpitations   . Psoriasis   . Vitamin D deficiency   . Xerostomia    and xerophthalmia--per [name] (he suggests SSA, SSB, ESR, rh factor, ANA, and antimicrosmal and antithyroglobulin ab's as of 02/17/16--all these labs were NORMAL.    Past Surgical History:  Procedure Laterality Date  . ABDOMINAL HYSTERECTOMY     for prolapse with abdominal incision  . BACK SURGERY  01/23/2015   Lumbar decompression with L2-L5 fusion ([name])  . CARDIAC CATHETERIZATION     no PCI  . CHOLECYSTECTOMY    . COLONOSCOPY  04/2006    Diverticulosis, o/w normal.  . DEXA  04/06/2018   T score -1.5 femur neck, -2.2 radius.  FRAX 10%/1.5%.  Repeat DEXA 2 yrs.  . ESOPHAGOGASTRODUODENOSCOPY  05/29/2009; 03/01/17   Esoph normal.  Gastritis (H pylori NEG) + gastric polyps (benign).  Duodenal biopsy NORMAL.  2018-mild chronic gastritis, H pylori NEG.  . EYE SURGERY Left    "lazy eye"  . HAND SURGERY Right    TRIGGER FINGER; index finger  . HERNIA REPAIR     umbilical  . KNEE ARTHROSCOPY     RIGHT KNEE X 5  . LAMINOTOMY  2010   L-4, L-5 FUSION  . NASAL SINUS SURGERY     polypectomy (remote past)  . OOPHORECTOMY  2010   Laparoscopic BSO (path benign)  . PATELLA FRACTURE SURGERY  10-08   [name]  . POSTERIOR LAMINECTOMY / DECOMPRESSION LUMBAR SPINE  9-09   [name]  . TOTAL KNEE ARTHROPLASTY  12-07 and 4-07   [name], revision 12-09 [name] at [facility]    Outpatient Medications Prior to Visit  Medication Sig Dispense Refill  . acetaminophen (TYLENOL) 325 MG tablet Take 650 mg by mouth every 6 (six) hours as needed.    . ALPRAZolam (XANAX) 0.5 MG tablet TAKE ONE-HALF TO ONE TABLET BY MOUTH THREE TIMES DAILY 270 tablet 1  . Azelastine HCl (ASTEPRO) 0.15 % SOLN 2 sprays each nostril every 12 hours (Patient taking differently: Place 2 sprays into the nose 2 (two) times daily as needed (Allergies). 2 sprays each nostril every 12 hours) 30 mL 11  . calcium carbonate (OSCAL) 1500 (600 Ca) MG TABS tablet Take by mouth daily.    . Cholecalciferol (VITAMIN D3) 5000 units CAPS Take 2 capsules by mouth daily.    . hydrocortisone 2.5 % cream Apply 1 application topically as needed. RECTAL ITCHING    . meclizine (ANTIVERT) 25 MG tablet Take 1 tablet (25 mg total) by mouth every 6 (six) hours as needed for dizziness. (Patient taking differently: Take 25 mg by mouth every 6 (six) hours as needed for dizziness. ) 360 tablet 3  . MELATONIN PO Take by mouth.    . meloxicam (MOBIC) 7.5 MG tablet Take 2 tablets (15 mg total) by mouth daily. 180  tablet 1  . Multiple Vitamins-Iron (MULTIVITAMIN/IRON PO) Take 1 tablet by mouth daily.     [name] NIACIN PO Take by mouth.    [name] omeprazole (PRILOSEC) 40 MG capsule Take 1 capsule (40 mg total) by mouth daily. 30 capsule 3  . Oxycodone HCl 10 MG TABS 10 mg every 4 (four) hours as needed. 1 every 6 hours for pain    . Probiotic Product (ALIGN) 4 MG CAPS Take 1 capsule (4 mg  total) by mouth daily. 60 capsule 0  . promethazine (PHENERGAN) 25 MG tablet TAKE ONE TABLET BY MOUTH EVERY 6 HOURS AS NEEDED FOR NAUSEA AND VOMITING 90 tablet 3  . Red Yeast Rice Extract (RED YEAST RICE PO) Take by mouth.    [name] Specialty Vitamins Products (MAGNESIUM, AMINO ACID CHELATE,) 133 MG tablet Take 1 tablet by mouth daily.    . traZODone (DESYREL) 50 MG tablet Take 1 tablet by mouth at bedtime.  2  . levothyroxine (SYNTHROID, LEVOTHROID) 75 MCG tablet TAKE 1/2 (ONE-HALF) TABLET BY MOUTH ONCE DAILY 45 tablet 3  . metoprolol tartrate (LOPRESSOR) 25 MG tablet Take 1 tablet (25 mg total) by mouth daily. 90 tablet 0  . dicyclomine (BENTYL) 20 MG tablet Take 1 tablet (20 mg total) by mouth 2 (two) times daily. (Patient not taking: Reported on 07/24/2018) 60 tablet 3  . diphenhydrAMINE (BENADRYL) 25 MG tablet Take 25 mg by mouth at bedtime as needed.      No facility-administered medications prior to visit.     Allergies  Allergen Reactions  . Cymbalta [Duloxetine Hcl]     Facial edema  . Iohexol      Code: HIVES, Desc: pt states her face and throat swells when administered IV contrast - KB, Onset Date: 97989211   . Mepergan [Meperidine-Promethazine]     Cardiac arrest  . Morphine Rash  . Augmentin [Amoxicillin-Pot Clavulanate] Nausea Only  . Citalopram Other (See Comments)    Jittery/heart racing   . Hctz [Hydrochlorothiazide] Other (See Comments)    Too much urinating, felt dried out, etc--NO ALLERGY  . Lexapro [Escitalopram Oxalate] Other (See Comments)    Jittery,heart racing  . Lyrica [Pregabalin] Swelling     Face, hands, fingers, ankles  . Valsartan     REACTION: pt states INTOL to Diovan    ROS As per HPI  PE: Blood pressure (!) 145/80, pulse (!) 52, temperature 98.3 F (36.8 C), temperature source Oral, resp. rate 16, height '5\' 6"'$  (1.676 m), weight 174 lb 8 oz (79.2 kg), SpO2 97 %. Gen: Alert, well appearing.  Patient is oriented to person, place, time, and situation. AFFECT: pleasant, lucid thought and speech. No further exam today.  LABS:    Chemistry      Component Value Date/Time   NA 140 04/06/2018 1145   NA 138 03/25/2017 1333   NA 138 11/25/2016 1147   K 3.7 04/06/2018 1145   K 4.3 03/25/2017 1333   K 3.8 11/25/2016 1147   CL 106 04/06/2018 1145   CL 104 03/25/2017 1333   CL 103 01/15/2010 0835   CO2 26 04/06/2018 1145   CO2 24 03/25/2017 1333   CO2 25 11/25/2016 1147   BUN 10 04/06/2018 1145   BUN 12 03/25/2017 1333   BUN 15.3 11/25/2016 1147   CREATININE 0.82 04/06/2018 1145   CREATININE 0.76 03/25/2017 1333   CREATININE 0.8 11/25/2016 1147      Component Value Date/Time   CALCIUM 9.1 04/06/2018 1145   CALCIUM 9.5 03/25/2017 1333   CALCIUM 9.2 11/25/2016 1147   ALKPHOS 114 04/06/2018 1145   ALKPHOS 111 03/25/2017 1333   ALKPHOS 99 11/25/2016 1147   AST 16 04/06/2018 1145   AST 24 11/25/2016 1147   ALT 12 04/06/2018 1145   ALT 30 11/25/2016 1147   BILITOT 0.6 04/06/2018 1145   BILITOT 0.61 11/25/2016 1147     Lab Results  Component Value Date   CHOL 226 (H) 07/24/2018   HDL  30.50 (L) 07/24/2018   LDLCALC 138 (H) 09/23/2014   LDLDIRECT 159.0 07/24/2018   TRIG 289.0 (H) 07/24/2018   CHOLHDL 7 07/24/2018   Lab Results  Component Value Date   TSH 3.17 01/24/2018   Lab Results  Component Value Date   WBC 6.2 04/06/2018   HGB 12.9 04/06/2018   HCT 38.8 04/06/2018   MCV 87.2 04/06/2018   PLT 186 04/06/2018    IMPRESSION AND PLAN:  1) HTN: The current medical regimen is effective;  continue present plan and medications. Lytes/cr stable 4 mo  ago.  Continue lopressor 25 mg qd.  2) Hyperlipidemia: lipid panel 01/2018 significantly abnl.  She declined med and wanted to try red yeast rice extract. FLP today.  Statin if numbers similar to last check (simva  3) Fibromyalgia and chronic fatigue: stable at this time.  4) Hypothyroidism: stable, takes med correctly.  Plan recheck TSH 6 mo.  5) Dysthymia and GAD: she goes through periods of trouble, then things improve for a while. Right now she is struggling but denies any SI or HI AND she does not want to change medication approach at all at this time.  She is not interested in counseling.  6) Chronic LBP: ESI's, neurosurg f/u, pt very much does not want to have to resort to surgery.  An After Visit Summary was printed and given to the patient.  FOLLOW UP: Return in about 6 months (around 01/22/2019) for routine chronic illness f/u.  Signed:  Crissie Sickles, MD           07/30/2018

## 2018-07-25 ENCOUNTER — Other Ambulatory Visit: Payer: Self-pay | Admitting: Family Medicine

## 2018-07-25 MED ORDER — SIMVASTATIN 20 MG PO TABS: 20.0000 mg | ORAL_TABLET | Freq: Every day | ORAL | 2 refills | Status: DC

## 2018-07-25 NOTE — Progress Notes (Signed)
Rx for Simvastatin sent to pharmacy per Lab result orders.

## 2018-07-26 ENCOUNTER — Encounter: Payer: Self-pay | Admitting: Gynecology

## 2018-07-26 ENCOUNTER — Ambulatory Visit (INDEPENDENT_AMBULATORY_CARE_PROVIDER_SITE_OTHER): Payer: Medicare Other | Admitting: Gynecology

## 2018-07-26 VITALS — BP 118/76

## 2018-07-26 DIAGNOSIS — N898 Other specified noninflammatory disorders of vagina: Secondary | ICD-10-CM

## 2018-07-26 LAB — WET PREP FOR TRICH, YEAST, CLUE

## 2018-07-26 MED ORDER — FLUCONAZOLE 150 MG PO TABS: 150.0000 mg | ORAL_TABLET | Freq: Once | ORAL | 0 refills | Status: AC

## 2018-07-26 NOTE — Patient Instructions (Signed)
Take the Diflucan pill x1 dose.  If the symptoms persist then call the office.  I gave you an additional pill to keep at home in the event you have a recurrence of your symptoms in the future.

## 2018-07-26 NOTE — Progress Notes (Signed)
    MELONEE GERSTEL 06-Aug-1946 [id number]        72 y.o.  G0P0 presents with several weeks of vaginal itching and slight discharge.  No odor.  A history of interstitial cystitis but has no new symptoms to suggest UTI such as more frequency urgency low back pain fever or chills.  Past medical history,surgical history, problem list, medications, allergies, family history and social history were all reviewed and documented in the EPIC chart.  Directed ROS with pertinent positives and negatives documented in the history of present illness/assessment and plan.  Exam: Caryn Bee assistant Vitals:   07/26/18 1220  BP: 118/76   General appearance:  Normal Abdomen soft nontender without masses guarding rebound Pelvic external BUS vagina with atrophic changes.  Scant discharge noted.  Bimanual without masses or tenderness.  Assessment/Plan:  72 y.o. G0P0 with 2 weeks of vaginal itching.  Wet prep was negative.  Exam showed scant discharge.  She had been treated with Diflucan during the summer for vaginal itching which resolved her symptoms.  I am going to arbitrarily go ahead and treat her with Diflucan 150 mg x 1 dose and see if this does not eradicate her symptoms.  If she continues with itching then she will call.  I did give her an additional dose to keep at home in the event that she has recurrent symptoms.    Anastasio Auerbach MD, 12:33 PM 07/26/2018

## 2018-07-28 ENCOUNTER — Other Ambulatory Visit: Payer: Self-pay

## 2018-07-28 MED ORDER — LEVOTHYROXINE SODIUM 75 MCG PO TABS: ORAL_TABLET | ORAL | 3 refills | Status: DC

## 2018-07-31 DIAGNOSIS — M62830 Muscle spasm of back: Secondary | ICD-10-CM | POA: Diagnosis not present

## 2018-07-31 DIAGNOSIS — G894 Chronic pain syndrome: Secondary | ICD-10-CM | POA: Diagnosis not present

## 2018-07-31 DIAGNOSIS — Z79891 Long term (current) use of opiate analgesic: Secondary | ICD-10-CM | POA: Diagnosis not present

## 2018-07-31 DIAGNOSIS — K5900 Constipation, unspecified: Secondary | ICD-10-CM | POA: Diagnosis not present

## 2018-07-31 DIAGNOSIS — M961 Postlaminectomy syndrome, not elsewhere classified: Secondary | ICD-10-CM | POA: Diagnosis not present

## 2018-08-03 DIAGNOSIS — M1712 Unilateral primary osteoarthritis, left knee: Secondary | ICD-10-CM | POA: Diagnosis not present

## 2018-08-03 DIAGNOSIS — M25561 Pain in right knee: Secondary | ICD-10-CM | POA: Diagnosis not present

## 2018-08-09 DIAGNOSIS — Z6828 Body mass index (BMI) 28.0-28.9, adult: Secondary | ICD-10-CM | POA: Diagnosis not present

## 2018-08-09 DIAGNOSIS — I10 Essential (primary) hypertension: Secondary | ICD-10-CM | POA: Diagnosis not present

## 2018-08-09 DIAGNOSIS — M48062 Spinal stenosis, lumbar region with neurogenic claudication: Secondary | ICD-10-CM | POA: Diagnosis not present

## 2018-08-10 ENCOUNTER — Other Ambulatory Visit: Payer: Self-pay | Admitting: Neurosurgery

## 2018-08-10 DIAGNOSIS — M48062 Spinal stenosis, lumbar region with neurogenic claudication: Secondary | ICD-10-CM

## 2018-08-20 ENCOUNTER — Encounter: Payer: Self-pay | Admitting: Family Medicine

## 2018-08-29 DIAGNOSIS — G894 Chronic pain syndrome: Secondary | ICD-10-CM | POA: Diagnosis not present

## 2018-08-29 DIAGNOSIS — M961 Postlaminectomy syndrome, not elsewhere classified: Secondary | ICD-10-CM | POA: Diagnosis not present

## 2018-08-29 DIAGNOSIS — K5900 Constipation, unspecified: Secondary | ICD-10-CM | POA: Diagnosis not present

## 2018-08-29 DIAGNOSIS — M62830 Muscle spasm of back: Secondary | ICD-10-CM | POA: Diagnosis not present

## 2018-09-01 ENCOUNTER — Ambulatory Visit: Payer: Self-pay | Admitting: *Deleted

## 2018-09-01 NOTE — Telephone Encounter (Signed)
SW pt, she agreed to come in next week. Apt made for 09/05/18 at 10:30am.   Maudie Mercury, I have put a hold on this apt time. Please put pt in. Thanks.)

## 2018-09-01 NOTE — Telephone Encounter (Signed)
Patient reports abdominal in R side for several weeks. She reports pain radiating to upper throat pain with deep breath yesterday. Patient reports that pain is better today- but still has the R abdominal pain.  Patient does have constipation due to narcotic use. Patient has been using stool softener and laxative. Patient has had hard round stool with blood in toilet today. This is the first time she has ever seen blood with the stool- so this is upsetting to her. PCP is at 100% today- offered another provider and patient strongly declines. She wants to see PCP. Call to office- they request note to be sent for provider review and they will contact patient.  Reason for Disposition . [1] MILD pain (e.g., does not interfere with normal activities) AND [2] pain comes and goes (cramps) AND [3] present > 48 hours  Answer Assessment - Initial Assessment Questions 1. APPEARANCE of BLOOD: "What color is it?" "Is it passed separately, on the surface of the stool, or mixed in with the stool?"      Bright red, surface on stool 2. AMOUNT: "How much blood was passed?"       teaspoon 3. FREQUENCY: "How many times has blood been passed with the stools?"      Once this morning 4. ONSET: "When was the blood first seen in the stools?" (Days or weeks)      This morning 5. DIARRHEA: "Is there also some diarrhea?" If so, ask: "How many diarrhea stools were passed in past 24 hours?"      no 6. CONSTIPATION: "Do you have constipation?" If so, "How bad is it?"     Yes- off/on all the time, this episode of constipation- 3 weeks 7. RECURRENT SYMPTOMS: "Have you had blood in your stools before?" If so, ask: "When was the last time?" and "What happened that time?"      no 8. BLOOD THINNERS: "Do you take any blood thinners?" (e.g., Coumadin/warfarin, Pradaxa/dabigatran, aspirin)     no 9. OTHER SYMPTOMS: "Do you have any other symptoms?"  (e.g., abdominal pain, vomiting, dizziness, fever)     Abdominal pain 10.  PREGNANCY: "Is there any chance you are pregnant?" "When was your last menstrual period?"       n/a  Answer Assessment - Initial Assessment Questions 1. LOCATION: "Where does it hurt?"      R abdomin  2. RADIATION: "Does the pain shoot anywhere else?" (e.g., chest, back)     Lower abdomin- cramping due constipation, last night it went to throat 3. ONSET: "When did the pain begin?" (e.g., minutes, hours or days ago)      Several weeks- started out as gnawing pain- last night it got more severe 4. SUDDEN: "Gradual or sudden onset?"     gradual onset 5. PATTERN "Does the pain come and go, or is it constant?"    - If constant: "Is it getting better, staying the same, or worsening?"      (Note: Constant means the pain never goes away completely; most serious pain is constant and it progresses)     - If intermittent: "How long does it last?" "Do you have pain now?"     (Note: Intermittent means the pain goes away completely between bouts)     Constant gnawing pain 6. SEVERITY: "How bad is the pain?"  (e.g., Scale 1-10; mild, moderate, or severe)   - MILD (1-3): doesn't interfere with normal activities, abdomen soft and not tender to touch    - MODERATE (  4-7): interferes with normal activities or awakens from sleep, tender to touch    - SEVERE (8-10): excruciating pain, doubled over, unable to do any normal activities      Now- 3 7. RECURRENT SYMPTOM: "Have you ever had this type of abdominal pain before?" If so, ask: "When was the last time?" and "What happened that time?"      no 8. CAUSE: "What do you think is causing the abdominal pain?"     Patient was thinking it was related to constipation 9. RELIEVING/AGGRAVATING FACTORS: "What makes it better or worse?" (e.g., movement, antacids, bowel movement)     no 10. OTHER SYMPTOMS: "Has there been any vomiting, diarrhea, constipation, or urine problems?"       Constipation, blood in stool today 11. PREGNANCY: "Is there any chance you are  pregnant?" "When was your last menstrual period?"       n/a  Protocols used: ABDOMINAL PAIN - FEMALE-A-AH, RECTAL BLEEDING-A-AH

## 2018-09-05 ENCOUNTER — Encounter: Payer: Self-pay | Admitting: Family Medicine

## 2018-09-05 ENCOUNTER — Ambulatory Visit (HOSPITAL_BASED_OUTPATIENT_CLINIC_OR_DEPARTMENT_OTHER)
Admission: RE | Admit: 2018-09-05 | Discharge: 2018-09-05 | Disposition: A | Discharge status: Home / Self Care | Payer: Medicare Other | Source: Ambulatory Visit | Attending: Family Medicine | Admitting: Family Medicine

## 2018-09-05 ENCOUNTER — Ambulatory Visit (INDEPENDENT_AMBULATORY_CARE_PROVIDER_SITE_OTHER): Payer: Medicare Other | Admitting: Family Medicine

## 2018-09-05 VITALS — BP 104/66 | HR 53 | Temp 98.0°F | Resp 16 | Ht 66.0 in | Wt 178.0 lb

## 2018-09-05 DIAGNOSIS — R1310 Dysphagia, unspecified: Secondary | ICD-10-CM

## 2018-09-05 DIAGNOSIS — R1030 Lower abdominal pain, unspecified: Secondary | ICD-10-CM | POA: Insufficient documentation

## 2018-09-05 DIAGNOSIS — K222 Esophageal obstruction: Secondary | ICD-10-CM

## 2018-09-05 DIAGNOSIS — K5909 Other constipation: Secondary | ICD-10-CM

## 2018-09-05 DIAGNOSIS — Z9889 Other specified postprocedural states: Secondary | ICD-10-CM | POA: Diagnosis not present

## 2018-09-05 NOTE — Progress Notes (Signed)
OFFICE VISIT  09/05/2018   CC:  Chief Complaint  Patient presents with  . Abdominal Pain    right side   HPI:    Patient is a 72 y.o. Caucasian female who presents for abdominal pain. Onset about 1 mo ago, nearly constant nawing RLQ pain that gradually increased in severity over the last 1 week.  Worse with taking deep breath.  She does battle with constipation b/c she takes oxycodone chronically. She has small bits of dry stool once a day usually.  No diarrhea.  Takes senakot and occ overshoots the mark. Eating does not affect the pain.  Feels some bloating sensation in lower abd diffusely.  No fevers.  BRB in stool x 1 occasion but no recurrence.  No melena.  No upper abd burning type pain.  No nausea or vomiting.  Takes tylenol and/or oxycodone and this doesn't help.  Describes chronic dysphagia, occ has regurgitation.  Has long/recurrent hx of this and has had to have esoph dilatation in the past multiple times per her report.  ROS: no CP, no SOB, no wheezing, no cough, no dizziness, no HAs, no rashes,  No polyuria or polydipsia.    Past Medical History:  Diagnosis Date  . Allergic rhinitis   . Anxiety   . Chest pain    cr  . Chronic gastritis    dx'd by EGD 2018 (erosive, likely NSAID-induced).  . Chronic pain syndrome    right knee osteo, hx of TKA in R knee.  Pt was told by [name] that no further surgical options are available for her; also, chronic lower back pain.--[name].  . Chronic renal insufficiency, stage II (mild) 2015   vs acute fluctuations depending on hydration?---GFR from 60s up to 80s.  . Diverticulosis of colon    with hx of 'itis  . DJD (degenerative joint disease)    L spine and knees.  R knee steroid injection 03/15/18 by Dr. Voytek.  . Epigastric pain    As of 01/2017, GI (Dr. Danis) planned EGD.  . FATIGUE / MALAISE 07/01/2009   Chronic  . Fibromyalgia   . GERD (gastroesophageal reflux disease)   . History of colonic polyps    . HTN (hypertension)   . Hypercholesterolemia    recommended restart of simvastatin 07/2018  . Hypothyroid 07/05/2012  . IBS (irritable bowel syndrome)   . IC (interstitial cystitis)    [name] at [facility]  . Iron deficiency anemia, unspecified 06/27/2013   Has required IV iron on multiple occasions (Dr. Ennever).  Hemoccults neg 12/2016.  . Lumbar back pain    Spinal stenosis, lumbar region with neurogenic claudication L5-S1 sx's--ESI's no help.  Dr. Pool to do CT myelogram as of 07/2018.  . Major depression, recurrent (HCC)    vs dysthymia,chronic  . Memory impairment 10/03/2012   Memory impairment 10/03/2012 >referral to neuro    . Monoclonal gammopathy of undetermined significance    IgM kappa MGUS (Dr. Ennever) routine f/u has shown stability of this problem.  . OSA (obstructive sleep apnea)    Latest sleep study 01/2017.  Some adjustments to CPAP being made+ dental referral for consideration of oral appliance --per pulm MD.  . Osteopenia 04/2014; 03/2018   2015: T score -2.0 FRAX 11%/1.8%.  2019 FRAX 10%/1.5%.  . Otitis externa, candida 07/2015   Dr. Wolicki tx'd her with clotrimazole 1% external solution x 1 mo  . Palpitations   . Psoriasis   . Vitamin D deficiency   .   Xerostomia    and xerophthalmia--per Dr. Wolicki (he suggests SSA, SSB, ESR, rh factor, ANA, and antimicrosmal and antithyroglobulin ab's as of 02/17/16--all these labs were NORMAL.    Past Surgical History:  Procedure Laterality Date  . ABDOMINAL HYSTERECTOMY     for prolapse with abdominal incision  . BACK SURGERY  01/23/2015   Lumbar decompression with L2-L5 fusion (Dr. Poole)  . CARDIAC CATHETERIZATION     no PCI  . CHOLECYSTECTOMY    . COLONOSCOPY  04/2006   Diverticulosis, o/w normal.  . DEXA  04/06/2018   T score -1.5 femur neck, -2.2 radius.  FRAX 10%/1.5%.  Repeat DEXA 2 yrs.  . ESOPHAGOGASTRODUODENOSCOPY  05/29/2009; 03/01/17   Esoph normal.  Gastritis (H pylori NEG) + gastric polyps (benign).   Duodenal biopsy NORMAL.  2018-mild chronic gastritis, H pylori NEG.  . EYE SURGERY Left    "lazy eye"  . HAND SURGERY Right    TRIGGER FINGER; index finger  . HERNIA REPAIR     umbilical  . KNEE ARTHROSCOPY     RIGHT KNEE X 5  . LAMINOTOMY  2010   L-4, L-5 FUSION  . NASAL SINUS SURGERY     polypectomy (remote past)  . OOPHORECTOMY  2010   Laparoscopic BSO (path benign)  . PATELLA FRACTURE SURGERY  10-08   Dr. Olin  . POSTERIOR LAMINECTOMY / DECOMPRESSION LUMBAR SPINE  9-09   Dr. Pool  . TOTAL KNEE ARTHROPLASTY  12-07 and 4-07   Dr. Beane, revision 12-09 Dr. Jenna at [facility]    Outpatient Medications Prior to Visit  Medication Sig Dispense Refill  . acetaminophen (TYLENOL) 325 MG tablet Take 650 mg by mouth every 6 (six) hours as needed.    . ALPRAZolam (XANAX) 0.5 MG tablet TAKE ONE-HALF TO ONE TABLET BY MOUTH THREE TIMES DAILY 270 tablet 1  . Azelastine HCl (ASTEPRO) 0.15 % SOLN 2 sprays each nostril every 12 hours (Patient taking differently: Place 2 sprays into the nose 2 (two) times daily as needed (Allergies). 2 sprays each nostril every 12 hours) 30 mL 11  . calcium carbonate (OSCAL) 1500 (600 Ca) MG TABS tablet Take by mouth daily.    . Cholecalciferol (VITAMIN D3) 5000 units CAPS Take 2 capsules by mouth daily.    . hydrocortisone 2.5 % cream Apply 1 application topically as needed. RECTAL ITCHING    . levothyroxine (SYNTHROID, LEVOTHROID) 75 MCG tablet TAKE 1/2 (ONE-HALF) TABLET BY MOUTH ONCE DAILY 45 tablet 3  . meclizine (ANTIVERT) 25 MG tablet Take 1 tablet (25 mg total) by mouth every 6 (six) hours as needed for dizziness. (Patient taking differently: Take 25 mg by mouth every 6 (six) hours as needed for dizziness. ) 360 tablet 3  . MELATONIN PO Take by mouth.    . meloxicam (MOBIC) 7.5 MG tablet Take 2 tablets (15 mg total) by mouth daily. 180 tablet 1  . metoprolol tartrate (LOPRESSOR) 25 MG tablet Take 1 tablet (25 mg total) by mouth daily. 90 tablet 1  . Multiple  Vitamins-Iron (MULTIVITAMIN/IRON PO) Take 1 tablet by mouth daily.     . NIACIN PO Take by mouth.    . omeprazole (PRILOSEC) 40 MG capsule Take 1 capsule (40 mg total) by mouth daily. 30 capsule 3  . Oxycodone HCl 10 MG TABS 10 mg every 4 (four) hours as needed. 1 every 6 hours for pain    . Probiotic Product (ALIGN) 4 MG CAPS Take 1 capsule (4 mg total) by mouth   daily. 60 capsule 0  . promethazine (PHENERGAN) 25 MG tablet TAKE ONE TABLET BY MOUTH EVERY 6 HOURS AS NEEDED FOR NAUSEA AND VOMITING 90 tablet 3  . Red Yeast Rice Extract (RED YEAST RICE PO) Take by mouth.    . simvastatin (ZOCOR) 20 MG tablet Take 1 tablet (20 mg total) by mouth at bedtime. 30 tablet 2  . Specialty Vitamins Products (MAGNESIUM, AMINO ACID CHELATE,) 133 MG tablet Take 1 tablet by mouth daily.    . traZODone (DESYREL) 50 MG tablet Take 1 tablet by mouth at bedtime.  2   No facility-administered medications prior to visit.     Allergies  Allergen Reactions  . Cymbalta [Duloxetine Hcl]     Facial edema  . Iohexol      Code: HIVES, Desc: pt states her face and throat swells when administered IV contrast - KB, Onset Date: 75916384   . Mepergan [Meperidine-Promethazine]     Cardiac arrest  . Morphine Rash  . Augmentin [Amoxicillin-Pot Clavulanate] Nausea Only  . Citalopram Other (See Comments)    Jittery/heart racing   . Hctz [Hydrochlorothiazide] Other (See Comments)    Too much urinating, felt dried out, etc--NO ALLERGY  . Lexapro [Escitalopram Oxalate] Other (See Comments)    Jittery,heart racing  . Lyrica [Pregabalin] Swelling    Face, hands, fingers, ankles  . Valsartan     REACTION: pt states INTOL to Diovan    ROS As per HPI  PE: Blood pressure 104/66, pulse (!) 53, temperature 98 F (36.7 C), temperature source Oral, resp. rate 16, height 5' 6" (1.676 m), weight 178 lb (80.7 kg), SpO2 96 %. Gen: Alert, well appearing.  Patient is oriented to person, place, time, and situation. YKZ:LDJT: no  injection, icteris, swelling, or exudate.  EOMI, PERRLA. Mouth: lips without lesion/swelling.  Oral mucosa pink and tacky. Oropharynx without erythema, exudate, or swelling.  CV: RRR, no m/r/g.   LUNGS: CTA bilat, nonlabored resps, good aeration in all lung fields. ABD: soft, ND, BS normal.  No hepatospenomegaly or mass.  No bruits. TTP RLQ w/out guarding or rebound.  Milder TTP in mid lower abd and RUQ.  LABS:  Lab Results  Component Value Date   WBC 6.2 04/06/2018   HGB 12.9 04/06/2018   HCT 38.8 04/06/2018   MCV 87.2 04/06/2018   PLT 186 04/06/2018     Chemistry      Component Value Date/Time   NA 140 04/06/2018 1145   NA 138 03/25/2017 1333   NA 138 11/25/2016 1147   K 3.7 04/06/2018 1145   K 4.3 03/25/2017 1333   K 3.8 11/25/2016 1147   CL 106 04/06/2018 1145   CL 104 03/25/2017 1333   CL 103 01/15/2010 0835   CO2 26 04/06/2018 1145   CO2 24 03/25/2017 1333   CO2 25 11/25/2016 1147   BUN 10 04/06/2018 1145   BUN 12 03/25/2017 1333   BUN 15.3 11/25/2016 1147   CREATININE 0.82 04/06/2018 1145   CREATININE 0.76 03/25/2017 1333   CREATININE 0.8 11/25/2016 1147      Component Value Date/Time   CALCIUM 9.1 04/06/2018 1145   CALCIUM 9.5 03/25/2017 1333   CALCIUM 9.2 11/25/2016 1147   ALKPHOS 114 04/06/2018 1145   ALKPHOS 111 03/25/2017 1333   ALKPHOS 99 11/25/2016 1147   AST 16 04/06/2018 1145   AST 24 11/25/2016 1147   ALT 12 04/06/2018 1145   ALT 30 11/25/2016 1147   BILITOT 0.6 04/06/2018 1145  BILITOT 0.61 11/25/2016 1147       IMPRESSION AND PLAN:  1) Lower abd pain, LLQ>mid>RLQ. Suspect constipation.  Diverticulitis less likely. Appears well today. Check KUB.  If no signif stool burden, then will do empiric trial of cipro and flagyl.  2) dysphagia, with hx of esoph stenosis requiring dilatation in the past: restart prilosec 40 mg qd and see GI (she'll call for appt b/c she is established with them already).  An After Visit Summary was printed and  given to the patient.  FOLLOW UP: To be determined based on results of pending workup.  Signed:  Phil McGowen, MD           09/05/2018     

## 2018-09-06 ENCOUNTER — Encounter: Payer: Self-pay | Admitting: *Deleted

## 2018-09-14 ENCOUNTER — Encounter: Payer: Medicare Other | Admitting: Gynecology

## 2018-09-24 ENCOUNTER — Other Ambulatory Visit: Payer: Self-pay | Admitting: Family Medicine

## 2018-09-25 NOTE — Telephone Encounter (Signed)
RF request for meloxicam LOV: 07/24/18 Next ov: 01/22/19 Last written: 03/31/18 #180 w/ 1RF  Please advise. Thanks.

## 2018-09-28 DIAGNOSIS — G894 Chronic pain syndrome: Secondary | ICD-10-CM | POA: Diagnosis not present

## 2018-09-28 DIAGNOSIS — M62830 Muscle spasm of back: Secondary | ICD-10-CM | POA: Diagnosis not present

## 2018-09-28 DIAGNOSIS — M961 Postlaminectomy syndrome, not elsewhere classified: Secondary | ICD-10-CM | POA: Diagnosis not present

## 2018-09-28 DIAGNOSIS — K5900 Constipation, unspecified: Secondary | ICD-10-CM | POA: Diagnosis not present

## 2018-10-04 DIAGNOSIS — M7061 Trochanteric bursitis, right hip: Secondary | ICD-10-CM | POA: Diagnosis not present

## 2018-10-05 ENCOUNTER — Inpatient Hospital Stay: Payer: Medicare Other | Attending: Hematology & Oncology | Admitting: Hematology & Oncology

## 2018-10-05 ENCOUNTER — Inpatient Hospital Stay: Payer: Medicare Other

## 2018-10-05 ENCOUNTER — Other Ambulatory Visit: Payer: Self-pay

## 2018-10-05 ENCOUNTER — Encounter: Payer: Self-pay | Admitting: Hematology & Oncology

## 2018-10-05 VITALS — BP 135/52 | HR 52 | Temp 97.8°F | Resp 18 | Wt 174.0 lb

## 2018-10-05 DIAGNOSIS — R109 Unspecified abdominal pain: Secondary | ICD-10-CM | POA: Insufficient documentation

## 2018-10-05 DIAGNOSIS — D500 Iron deficiency anemia secondary to blood loss (chronic): Secondary | ICD-10-CM

## 2018-10-05 DIAGNOSIS — D472 Monoclonal gammopathy: Secondary | ICD-10-CM | POA: Insufficient documentation

## 2018-10-05 DIAGNOSIS — E611 Iron deficiency: Secondary | ICD-10-CM

## 2018-10-05 LAB — CMP (CANCER CENTER ONLY)
ALBUMIN: 4.9 g/dL (ref 3.5–5.0)
ALT: 14 U/L (ref 0–44)
ANION GAP: 10 (ref 5–15)
AST: 11 U/L — AB (ref 15–41)
Alkaline Phosphatase: 104 U/L (ref 38–126)
BUN: 12 mg/dL (ref 8–23)
CHLORIDE: 103 mmol/L (ref 98–111)
CO2: 29 mmol/L (ref 22–32)
Calcium: 9.4 mg/dL (ref 8.9–10.3)
Creatinine: 0.93 mg/dL (ref 0.44–1.00)
GFR, Est AFR Am: >60 mL/min (ref >60)
GFR, Estimated: >60 mL/min (ref >60)
GLUCOSE: 113 mg/dL — AB (ref 70–99)
POTASSIUM: 3.8 mmol/L (ref 3.5–5.1)
SODIUM: 142 mmol/L (ref 135–145)
TOTAL PROTEIN: 8 g/dL (ref 6.5–8.1)
Total Bilirubin: 0.5 mg/dL (ref 0.3–1.2)

## 2018-10-05 LAB — CBC WITH DIFFERENTIAL (CANCER CENTER ONLY)
Abs Immature Granulocytes: 0.05 10*3/uL (ref 0.00–0.07)
BASOS ABS: 0 10*3/uL (ref 0.0–0.1)
BASOS PCT: 0 %
EOS ABS: 0 10*3/uL (ref 0.0–0.5)
Eosinophils Relative: 0 %
HCT: 45.9 % (ref 36.0–46.0)
Hemoglobin: 14.8 g/dL (ref 12.0–15.0)
IMMATURE GRANULOCYTES: 0 %
Lymphocytes Relative: 12 %
Lymphs Abs: 1.7 10*3/uL (ref 0.7–4.0)
MCH: 29.5 pg (ref 26.0–34.0)
MCHC: 32.2 g/dL (ref 30.0–36.0)
MCV: 91.4 fL (ref 80.0–100.0)
MONOS PCT: 3 %
Monocytes Absolute: 0.5 10*3/uL (ref 0.1–1.0)
NEUTROS PCT: 85 %
NRBC: 0 % (ref 0.0–0.2)
Neutro Abs: 11.9 10*3/uL — ABNORMAL HIGH (ref 1.7–7.7)
PLATELETS: 260 10*3/uL (ref 150–400)
RBC: 5.02 MIL/uL (ref 3.87–5.11)
RDW: 12.4 % (ref 11.5–15.5)
WBC Count: 14.1 10*3/uL — ABNORMAL HIGH (ref 4.0–10.5)

## 2018-10-05 MED ORDER — DICYCLOMINE HCL 10 MG PO CAPS: 10.0000 mg | ORAL_CAPSULE | Freq: Three times a day (TID) | ORAL | 2 refills | Status: DC

## 2018-10-05 NOTE — Progress Notes (Signed)
Hematology and Oncology Follow Up Visit  EMUNAH TEXIDOR [id number] 1946-04-10 73 y.o. 10/05/2018   Principle Diagnosis:  1. IgM kappa monoclonal gammopathy of undetermined significance 2. Intermittent iron deficiency anemia  Current Therapy:   IV iron as indicated - last received in July 2019   Interim History:  Ms. Giambra is here today with her friend for follow-up.  Surprisingly enough, she is spending most of her time now taking care of her husband.  He is an alcoholic.  He has fallen and has broken his left arm.  He also fractured his left ankle and needed surgery for this.  He is still drinking.  She is complaining of some abdominal discomfort.  She had an abdominal x-ray a month ago.  This showed constipation.  Her family doctor is treating this and she is feeling a little bit better.  I will try her on some hyoscyamine (10 mg p.o. q. before meals as needed).  We had to give her some IV iron back in July 2019.  Her ferritin was 143 with an iron saturation of only 11%.  As far as her MGUS is concerned, we last saw her in July, her M spike was 0.5 g/dL.  Her IgM level was 630 mg/dL.  Her kappa light chain was 1.7 mg/dL.  She has had no fever.  She has had no infections.  She has had no cough.  There is been no rashes.  Overall, her performance status is ECOG 1.    Medications:  Allergies as of 10/05/2018      Reactions   Cymbalta [duloxetine Hcl]    Facial edema   Iohexol     Code: HIVES, Desc: pt states her face and throat swells when administered IV contrast - KB, Onset Date: [id number]   Mepergan [meperidine-promethazine]    Cardiac arrest   Morphine Rash   Augmentin [amoxicillin-pot Clavulanate] Nausea Only   Citalopram Other (See Comments)   Jittery/heart racing   Hctz [hydrochlorothiazide] Other (See Comments)   Too much urinating, felt dried out, etc--NO ALLERGY   Lexapro [escitalopram Oxalate] Other (See Comments)   Jittery,heart racing   Lyrica [pregabalin]  Swelling   Face, hands, fingers, ankles   Valsartan    REACTION: pt states INTOL to Diovan      Medication List       Accurate as of October 05, 2018 12:55 PM. Always use your most recent med list.        acetaminophen 325 MG tablet Commonly known as:  TYLENOL Take 650 mg by mouth every 6 (six) hours as needed.   ALIGN 4 MG Caps Take 1 capsule (4 mg total) by mouth daily.   ALPRAZolam 0.5 MG tablet Commonly known as:  XANAX TAKE ONE-HALF TO ONE TABLET BY MOUTH THREE TIMES DAILY   Azelastine HCl 0.15 % Soln Commonly known as:  ASTEPRO 2 sprays each nostril every 12 hours   calcium carbonate 1500 (600 Ca) MG Tabs tablet Commonly known as:  OSCAL Take by mouth daily.   dicyclomine 10 MG capsule Commonly known as:  BENTYL Take 1 capsule (10 mg total) by mouth 3 (three) times daily before meals.   hydrocortisone 2.5 % cream Apply 1 application topically as needed. RECTAL ITCHING   levothyroxine 75 MCG tablet Commonly known as:  SYNTHROID, LEVOTHROID TAKE 1/2 (ONE-HALF) TABLET BY MOUTH ONCE DAILY   magnesium (amino acid chelate) 133 MG tablet Take 1 tablet by mouth daily.   meclizine 25 MG tablet Commonly known  as:  ANTIVERT Take 1 tablet (25 mg total) by mouth every 6 (six) hours as needed for dizziness.   MELATONIN PO Take by mouth.   meloxicam 7.5 MG tablet Commonly known as:  MOBIC TAKE 2 TABLETS (15 MG TOTAL) BY MOUTH DAILY.   metoprolol tartrate 25 MG tablet Commonly known as:  LOPRESSOR Take 1 tablet (25 mg total) by mouth daily.   MULTIVITAMIN/IRON PO Take 1 tablet by mouth daily.   NIACIN PO Take by mouth.   omeprazole 40 MG capsule Commonly known as:  PRILOSEC Take 1 capsule (40 mg total) by mouth daily.   Oxycodone HCl 10 MG Tabs 10 mg every 4 (four) hours as needed. 1 every 6 hours for pain   promethazine 25 MG tablet Commonly known as:  PHENERGAN TAKE ONE TABLET BY MOUTH EVERY 6 HOURS AS NEEDED FOR NAUSEA AND VOMITING   RED YEAST  RICE PO Take by mouth.   simvastatin 20 MG tablet Commonly known as:  ZOCOR Take 1 tablet (20 mg total) by mouth at bedtime.   SLEEP AID 25 MG tablet Generic drug:  doxylamine (Sleep) Take 25 mg by mouth at bedtime as needed.   traZODone 50 MG tablet Commonly known as:  DESYREL Take 1 tablet by mouth at bedtime.   Vitamin D3 125 MCG (5000 UT) Caps Take 2 capsules by mouth daily.       Allergies:  Allergies  Allergen Reactions  . Cymbalta [Duloxetine Hcl]     Facial edema  . Iohexol      Code: HIVES, Desc: pt states her face and throat swells when administered IV contrast - KB, Onset Date: [id number]   . Mepergan [Meperidine-Promethazine]     Cardiac arrest  . Morphine Rash  . Augmentin [Amoxicillin-Pot Clavulanate] Nausea Only  . Citalopram Other (See Comments)    Jittery/heart racing   . Hctz [Hydrochlorothiazide] Other (See Comments)    Too much urinating, felt dried out, etc--NO ALLERGY  . Lexapro [Escitalopram Oxalate] Other (See Comments)    Jittery,heart racing  . Lyrica [Pregabalin] Swelling    Face, hands, fingers, ankles  . Valsartan     REACTION: pt states INTOL to Diovan    Past Medical History, Surgical history, Social history, and Family History were reviewed and updated.  Review of Systems: Review of Systems  Constitutional: Positive for malaise/fatigue.  Eyes: Positive for blurred vision.  Respiratory: Positive for wheezing.   Cardiovascular: Positive for palpitations.  Gastrointestinal: Positive for nausea.  Genitourinary: Positive for urgency.  Musculoskeletal: Positive for back pain, joint pain and myalgias.  Neurological: Positive for tingling.  Endo/Heme/Allergies: Negative.   Psychiatric/Behavioral: Negative.      Physical Exam:  weight is 174 lb (78.9 kg). Her oral temperature is 97.8 F (36.6 C). Her blood pressure is 135/52 (abnormal) and her pulse is 52 (abnormal). Her respiration is 18 and oxygen saturation is 98%.   Wt  Readings from Last 3 Encounters:  10/05/18 174 lb (78.9 kg)  09/05/18 178 lb (80.7 kg)  07/24/18 174 lb 8 oz (79.2 kg)    Physical Exam Vitals signs reviewed.  HENT:     Head: Normocephalic and atraumatic.  Eyes:     Pupils: Pupils are equal, round, and reactive to light.  Neck:     Musculoskeletal: Normal range of motion.  Cardiovascular:     Rate and Rhythm: Normal rate and regular rhythm.     Heart sounds: Normal heart sounds.  Pulmonary:     Effort: Pulmonary  effort is normal.     Breath sounds: Normal breath sounds.  Abdominal:     General: Bowel sounds are normal.     Palpations: Abdomen is soft.  Musculoskeletal: Normal range of motion.        General: No tenderness or deformity.  Lymphadenopathy:     Cervical: No cervical adenopathy.  Skin:    General: Skin is warm and dry.     Findings: No erythema or rash.  Neurological:     Mental Status: She is alert and oriented to person, place, and time.  Psychiatric:        Behavior: Behavior normal.        Thought Content: Thought content normal.        Judgment: Judgment normal.     Lab Results  Component Value Date   WBC 14.1 (H) 10/05/2018   HGB 14.8 10/05/2018   HCT 45.9 10/05/2018   MCV 91.4 10/05/2018   PLT 260 10/05/2018   Lab Results  Component Value Date   FERRITIN 143 04/06/2018   IRON 31 (L) 04/06/2018   TIBC 284 04/06/2018   UIBC 253 04/06/2018   IRONPCTSAT 11 (L) 04/06/2018   Lab Results  Component Value Date   RETICCTPCT 1.2 01/20/2011   RBC 5.02 10/05/2018   RETICCTABS 52.2 01/20/2011   Lab Results  Component Value Date   KPAFRELGTCHN 17.3 04/06/2018   LAMBDASER 16.6 04/06/2018   KAPLAMBRATIO 1.04 04/06/2018   Lab Results  Component Value Date   IGGSERUM 667 (L) 04/06/2018   IGA 287 04/06/2018   IGMSERUM 630 (H) 04/06/2018   Lab Results  Component Value Date   TOTALPROTELP 6.7 04/06/2018   ALBUMINELP 3.3 04/06/2018   A1GS 0.2 04/06/2018   A2GS 0.9 04/06/2018   BETS 1.0  04/06/2018   BETA2SER 0.5 06/19/2015   GAMS 1.2 04/06/2018   MSPIKE 0.5 (H) 04/06/2018   SPEI Comment 10/06/2017     Chemistry      Component Value Date/Time   NA 142 10/05/2018 1108   NA 138 03/25/2017 1333   NA 138 11/25/2016 1147   K 3.8 10/05/2018 1108   K 4.3 03/25/2017 1333   K 3.8 11/25/2016 1147   CL 103 10/05/2018 1108   CL 104 03/25/2017 1333   CL 103 01/15/2010 0835   CO2 29 10/05/2018 1108   CO2 24 03/25/2017 1333   CO2 25 11/25/2016 1147   BUN 12 10/05/2018 1108   BUN 12 03/25/2017 1333   BUN 15.3 11/25/2016 1147   CREATININE 0.93 10/05/2018 1108   CREATININE 0.76 03/25/2017 1333   CREATININE 0.8 11/25/2016 1147      Component Value Date/Time   CALCIUM 9.4 10/05/2018 1108   CALCIUM 9.5 03/25/2017 1333   CALCIUM 9.2 11/25/2016 1147   ALKPHOS 104 10/05/2018 1108   ALKPHOS 111 03/25/2017 1333   ALKPHOS 99 11/25/2016 1147   AST 11 (L) 10/05/2018 1108   AST 24 11/25/2016 1147   ALT 14 10/05/2018 1108   ALT 30 11/25/2016 1147   BILITOT 0.5 10/05/2018 1108   BILITOT 0.61 11/25/2016 1147      Impression and Plan: Ms. Amaro is a very pleasant 73 yo caucasian female with MGUS and history of iron deficiency.  She has an IgM MGUS.  This we will have to watch closely.  We have been watching her for several years.  So far, we have not seen anything that shows progression to a lymphoplasmacytic lymphoma or myeloma.  We will go ahead  and plan to get her back in 4 months.  It is always nice to talk with her.  We always have good fellowship.  [name], MD 1/16/202012:55 PM

## 2018-10-06 ENCOUNTER — Encounter: Payer: Self-pay | Admitting: *Deleted

## 2018-10-06 LAB — IRON AND TIBC
Iron: 84 ug/dL (ref 41–142)
Saturation Ratios: 26 % (ref 21–57)
TIBC: 320 ug/dL (ref 236–444)
UIBC: 237 ug/dL (ref 120–384)

## 2018-10-06 LAB — KAPPA/LAMBDA LIGHT CHAINS
Kappa free light chain: 13.4 mg/L (ref 3.3–19.4)
Kappa, lambda light chain ratio: 1.04 (ref 0.26–1.65)
Lambda free light chains: 12.9 mg/L (ref 5.7–26.3)

## 2018-10-06 LAB — IGG, IGA, IGM
IGA: 279 mg/dL (ref 64–422)
IGG (IMMUNOGLOBIN G), SERUM: 797 mg/dL (ref 700–1600)
IGM (IMMUNOGLOBULIN M), SRM: 689 mg/dL — AB (ref 26–217)

## 2018-10-06 LAB — FERRITIN: Ferritin: 244 ng/mL (ref 11–307)

## 2018-10-09 LAB — PROTEIN ELECTROPHORESIS, SERUM, WITH REFLEX
A/G Ratio: 1.2 (ref 0.7–1.7)
Albumin ELP: 4.4 g/dL (ref 2.9–4.4)
Alpha-1-Globulin: 0.2 g/dL (ref 0.0–0.4)
Alpha-2-Globulin: 0.9 g/dL (ref 0.4–1.0)
Beta Globulin: 1.2 g/dL (ref 0.7–1.3)
Gamma Globulin: 1.3 g/dL (ref 0.4–1.8)
Globulin, Total: 3.6 g/dL (ref 2.2–3.9)
M-Spike, %: 0.5 g/dL — ABNORMAL HIGH
SPEP INTERP: 0
Total Protein ELP: 8 g/dL (ref 6.0–8.5)

## 2018-10-09 LAB — IMMUNOFIXATION REFLEX, SERUM
IGM (IMMUNOGLOBULIN M), SRM: 881 mg/dL — AB (ref 26–217)
IgA: 369 mg/dL (ref 64–422)
IgG (Immunoglobin G), Serum: 958 mg/dL (ref 700–1600)

## 2018-10-10 ENCOUNTER — Encounter: Payer: Self-pay | Admitting: *Deleted

## 2018-10-13 ENCOUNTER — Encounter: Payer: Self-pay | Admitting: Family Medicine

## 2018-10-19 ENCOUNTER — Other Ambulatory Visit: Payer: Self-pay | Admitting: Family Medicine

## 2018-11-02 DIAGNOSIS — M961 Postlaminectomy syndrome, not elsewhere classified: Secondary | ICD-10-CM | POA: Diagnosis not present

## 2018-11-02 DIAGNOSIS — K5900 Constipation, unspecified: Secondary | ICD-10-CM | POA: Diagnosis not present

## 2018-11-02 DIAGNOSIS — G894 Chronic pain syndrome: Secondary | ICD-10-CM | POA: Diagnosis not present

## 2018-11-02 DIAGNOSIS — M62830 Muscle spasm of back: Secondary | ICD-10-CM | POA: Diagnosis not present

## 2018-11-03 ENCOUNTER — Telehealth: Payer: Self-pay | Admitting: Family Medicine

## 2018-11-03 ENCOUNTER — Telehealth: Payer: Self-pay | Admitting: *Deleted

## 2018-11-03 MED ORDER — AMOXICILLIN 500 MG PO CAPS: ORAL_CAPSULE | ORAL | 2 refills | Status: DC

## 2018-11-03 NOTE — Telephone Encounter (Signed)
OK. Amoxicillin eRx'd

## 2018-11-03 NOTE — Telephone Encounter (Signed)
Copied from [place] [id number]. Topic: Quick Communication - See Telephone Encounter >> Nov 03, 2018 12:10 PM [name] wrote: CRM for notification. See Telephone encounter for: 11/03/18.  Patient states she is going to the dentist on March 23 for a cleaning and she said that she always has to have an antibiotic for this prior so she does not get any infection. She said her [facility] Doctor advised of this and that Dr [name] has done this before for her in the past. She usually gets Amoxicillin. Please Advise.  [place], [address] [place] [address]

## 2018-11-03 NOTE — Telephone Encounter (Signed)
Patient called c/o UTI requesting Rx, I explained she will need OV for treatment, patient said she can't schedule visit today because her husband has appointments. I recommend she stop by urgent care so UTI won't worsen over the weekend. Patient verbalized she understood.

## 2018-11-03 NOTE — Telephone Encounter (Signed)
Called RX into [facility] pharmacy, canceled RX that was sent to [facility].   [facility] for pt to pick up rx.

## 2018-11-06 ENCOUNTER — Telehealth: Payer: Self-pay | Admitting: *Deleted

## 2018-11-06 MED ORDER — FLUCONAZOLE 150 MG PO TABS: 150.0000 mg | ORAL_TABLET | Freq: Once | ORAL | 0 refills | Status: AC

## 2018-11-06 NOTE — Telephone Encounter (Signed)
Patient called c/o vaginal itching asked if Rx could be sent to yeast infection? Please advise

## 2018-11-06 NOTE — Telephone Encounter (Signed)
Diflucan 150mg x 1 dose

## 2018-11-06 NOTE — Telephone Encounter (Signed)
Rx sent- patient aware.  

## 2018-11-09 ENCOUNTER — Other Ambulatory Visit: Payer: Self-pay | Admitting: Family Medicine

## 2018-11-09 MED ORDER — SIMVASTATIN 20 MG PO TABS: 20.0000 mg | ORAL_TABLET | Freq: Every day | ORAL | 0 refills | Status: DC

## 2018-11-09 NOTE — Telephone Encounter (Signed)
Copied from [place] [id number]. Topic: Quick Communication - Rx Refill/Question >> Nov 09, 2018  2:17 PM Keene Breath wrote: Medication: simvastatin (ZOCOR) 20 MG tablet  Patient called to request a refill for the above medication   Preferred Pharmacy (with phone number or street name): [place], [place] - [place] [phone] (Phone) [phone] (Fax)

## 2018-11-30 DIAGNOSIS — G894 Chronic pain syndrome: Secondary | ICD-10-CM | POA: Diagnosis not present

## 2018-11-30 DIAGNOSIS — M62830 Muscle spasm of back: Secondary | ICD-10-CM | POA: Diagnosis not present

## 2018-11-30 DIAGNOSIS — M961 Postlaminectomy syndrome, not elsewhere classified: Secondary | ICD-10-CM | POA: Diagnosis not present

## 2018-11-30 DIAGNOSIS — K5900 Constipation, unspecified: Secondary | ICD-10-CM | POA: Diagnosis not present

## 2018-12-12 ENCOUNTER — Telehealth: Payer: Self-pay | Admitting: Family Medicine

## 2018-12-12 NOTE — Telephone Encounter (Signed)
Copied from [place] [id number]. Topic: General - Other >> Dec 12, 2018  4:42 PM Lennox Solders wrote: Reason for CRM: pt left on refill voice message  she needs her alprazolam send to her new pharm [facility] [address].

## 2018-12-13 ENCOUNTER — Telehealth: Payer: Self-pay | Admitting: Family Medicine

## 2018-12-13 ENCOUNTER — Telehealth: Payer: Self-pay

## 2018-12-13 MED ORDER — ALPRAZOLAM 0.5 MG PO TABS: ORAL_TABLET | ORAL | 1 refills | Status: DC

## 2018-12-13 NOTE — Telephone Encounter (Signed)
lmtcb ref. Telephone visit 3/26

## 2018-12-13 NOTE — Telephone Encounter (Signed)
Approved by Dr [name], rx sent.

## 2018-12-13 NOTE — Telephone Encounter (Signed)
OK, appt in office per pt request.-thx

## 2018-12-13 NOTE — Telephone Encounter (Signed)
Called patient to schedule OV. Will come in tomorrow afternoon instead at 1:00pm.

## 2018-12-13 NOTE — Telephone Encounter (Signed)
Pt returned your call, she preferred to cancell appt for now. She will cb to r/s.

## 2018-12-13 NOTE — Telephone Encounter (Signed)
Spoke with patient and she is having severe pain on her right side, hands and legs. Telephone visit offered and was declined. She has been taking Tylenol OTC and pain meds given by pain management. Pain rate currently is 8/10. She preferred to be seen in office if possible.  Please advise, thanks.

## 2018-12-13 NOTE — Telephone Encounter (Signed)
Patient is requesting an appt. She is hurting, pain feels like arthritis. Please call her home #.

## 2018-12-13 NOTE — Telephone Encounter (Signed)
Patient requesting RF for Alprazolam.  Last OV 12.17.2019 Next OV 5.4.2020 Last RX 03/29/18 # 270 X 1 RF.  Please advise.

## 2018-12-14 ENCOUNTER — Ambulatory Visit (INDEPENDENT_AMBULATORY_CARE_PROVIDER_SITE_OTHER): Payer: Medicare Other | Admitting: Family Medicine

## 2018-12-14 ENCOUNTER — Encounter: Payer: Self-pay | Admitting: Family Medicine

## 2018-12-14 ENCOUNTER — Other Ambulatory Visit: Payer: Self-pay

## 2018-12-14 ENCOUNTER — Telehealth: Payer: Medicare Other | Admitting: Gastroenterology

## 2018-12-14 VITALS — BP 141/83 | HR 67 | Temp 98.2°F | Resp 16 | Ht 66.0 in | Wt 184.2 lb

## 2018-12-14 DIAGNOSIS — K5792 Diverticulitis of intestine, part unspecified, without perforation or abscess without bleeding: Secondary | ICD-10-CM

## 2018-12-14 DIAGNOSIS — R3915 Urgency of urination: Secondary | ICD-10-CM

## 2018-12-14 DIAGNOSIS — R5382 Chronic fatigue, unspecified: Secondary | ICD-10-CM

## 2018-12-14 DIAGNOSIS — R350 Frequency of micturition: Secondary | ICD-10-CM

## 2018-12-14 DIAGNOSIS — E039 Hypothyroidism, unspecified: Secondary | ICD-10-CM | POA: Diagnosis not present

## 2018-12-14 DIAGNOSIS — M797 Fibromyalgia: Secondary | ICD-10-CM | POA: Diagnosis not present

## 2018-12-14 DIAGNOSIS — K5909 Other constipation: Secondary | ICD-10-CM | POA: Diagnosis not present

## 2018-12-14 LAB — COMPREHENSIVE METABOLIC PANEL
ALT: 21 U/L (ref 0–35)
AST: 18 U/L (ref 0–37)
Albumin: 4.5 g/dL (ref 3.5–5.2)
Alkaline Phosphatase: 91 U/L (ref 39–117)
BUN: 24 mg/dL — AB (ref 6–23)
CHLORIDE: 101 meq/L (ref 96–112)
CO2: 28 mEq/L (ref 19–32)
Calcium: 9.9 mg/dL (ref 8.4–10.5)
Creatinine, Ser: 0.91 mg/dL (ref 0.40–1.20)
GFR: 60.76 mL/min (ref >60.00)
Glucose, Bld: 106 mg/dL — ABNORMAL HIGH (ref 70–99)
Potassium: 4.2 mEq/L (ref 3.5–5.1)
Sodium: 138 mEq/L (ref 135–145)
Total Bilirubin: 0.4 mg/dL (ref 0.2–1.2)
Total Protein: 7.3 g/dL (ref 6.0–8.3)

## 2018-12-14 LAB — URINALYSIS, ROUTINE W REFLEX MICROSCOPIC
Bilirubin Urine: NEGATIVE
Ketones, ur: NEGATIVE
Leukocytes,Ua: NEGATIVE
NITRITE: NEGATIVE
Specific Gravity, Urine: 1.025 (ref 1.000–1.030)
Total Protein, Urine: NEGATIVE
Urine Glucose: NEGATIVE
Urobilinogen, UA: 0.2 (ref 0.0–1.0)
pH: 5.5 (ref 5.0–8.0)

## 2018-12-14 LAB — CBC WITH DIFFERENTIAL/PLATELET
BASOS ABS: 0.1 10*3/uL (ref 0.0–0.1)
Basophils Relative: 0.6 % (ref 0.0–3.0)
Eosinophils Absolute: 0.4 10*3/uL (ref 0.0–0.7)
Eosinophils Relative: 4 % (ref 0.0–5.0)
HCT: 43.1 % (ref 36.0–46.0)
Hemoglobin: 14.9 g/dL (ref 12.0–15.0)
LYMPHS ABS: 2.5 10*3/uL (ref 0.7–4.0)
Lymphocytes Relative: 25.3 % (ref 12.0–46.0)
MCHC: 34.5 g/dL (ref 30.0–36.0)
MCV: 86 fl (ref 78.0–100.0)
MONOS PCT: 4.8 % (ref 3.0–12.0)
Monocytes Absolute: 0.5 10*3/uL (ref 0.1–1.0)
Neutro Abs: 6.3 10*3/uL (ref 1.4–7.7)
Neutrophils Relative %: 65.3 % (ref 43.0–77.0)
Platelets: 232 10*3/uL (ref 150.0–400.0)
RBC: 5.01 Mil/uL (ref 3.87–5.11)
RDW: 12.9 % (ref 11.5–15.5)
WBC: 9.7 10*3/uL (ref 4.0–10.5)

## 2018-12-14 LAB — TSH: TSH: 3.98 u[IU]/mL (ref 0.35–4.50)

## 2018-12-14 LAB — SEDIMENTATION RATE: SED RATE: 31 mm/h — AB (ref 0–30)

## 2018-12-14 LAB — CK: Total CK: 61 U/L (ref 7–177)

## 2018-12-14 MED ORDER — METRONIDAZOLE 500 MG PO TABS: 500.0000 mg | ORAL_TABLET | Freq: Three times a day (TID) | ORAL | 0 refills | Status: AC

## 2018-12-14 MED ORDER — ALPRAZOLAM 1 MG PO TABS: ORAL_TABLET | ORAL | 5 refills | Status: DC

## 2018-12-14 MED ORDER — FLUTICASONE PROPIONATE 0.05 % EX CREA: TOPICAL_CREAM | Freq: Two times a day (BID) | CUTANEOUS | 1 refills | Status: DC

## 2018-12-14 MED ORDER — CIPROFLOXACIN HCL 500 MG PO TABS: ORAL_TABLET | ORAL | 0 refills | Status: DC

## 2018-12-14 NOTE — Progress Notes (Signed)
OFFICE VISIT  12/14/2018   CC:  Chief Complaint  Patient presents with  . Generalized Body Aches    x1 month. RL abd pain x1 month. pt states she can feel something when hse presses on it  Pt thinks she forgot to take her BP medication this morning   . Nocturia   HPI:    Patient is a 73 y.o. Caucasian female who presents for pain.  I last saw her 3 mo ago  Onset about 3 weeks ago, progressively worsening--all muscles have been hurting, hands swollen in AM, knees hurt a lot lately. No fevers.  No swelling of joints or erythema of joints.  No ST, no cough, no ulcers in mouth or on lips. C/o abd pain, mainly RLQ.  Says alternates 1 capful miralax qd with 2 dulcolax pills per day.  Usually has one BM daily, little marbles that are difficult to pass.  Occ BRB when she wipes.  No diarrhea. She has a long history of chronic pain: fibromyalgia syndrome, bilat knee osteoarthritis, lumbar spondylosis/DDD. She does take one oxycodone 4 times per day.  Most recent pain mgmt visit 12/04/18---this was when she started getting worse.  No med changes made at that time.  ROS: some itchy skin on hands/fingers.  No coldness or change in color of hands/feet. Initial steps in morning hurts the bottoms of feet. Small rash on middle of back in thoracic level that is sometimes itchy.  Unclear how long it has been there.  Sleep impairment -chronic.  Appetite is not that good.  She cannot exercise any. No cold intolerance or heat intolerance.  No recent night sweats. Urinates 10-15 times per day.  This does not seem new.  No dysuria.  +urgency chronic.   Past Medical History:  Diagnosis Date  . Allergic rhinitis   . Anxiety   . Chest pain    cr  . Chronic gastritis    dx'd by EGD 2018 (erosive, likely NSAID-induced).  . Chronic pain syndrome    right knee osteo, hx of TKA in R knee.  Pt was told by [name] that no further surgical options are available for her; also, chronic lower back pain.--[facility].  [name] Chronic renal insufficiency, stage II (mild) 2015   vs acute fluctuations depending on hydration?---GFR from 60s up to 80s.  . Diverticulosis of colon    with hx of 'itis  . DJD (degenerative joint disease)    L spine and knees.  R knee steroid injection 03/15/18 by Dr. Lynann Bologna.  [name] Epigastric pain    As of 01/2017, GI (Dr. Loletha Carrow) planned EGD.  [name] FATIGUE / MALAISE 07/01/2009   Chronic  . Fibromyalgia   . GERD (gastroesophageal reflux disease)   . History of colonic polyps   . HTN (hypertension)   . Hypercholesterolemia    recommended restart of simvastatin 07/2018  . Hypothyroid 07/05/2012  . IBS (irritable bowel syndrome)   . IC (interstitial cystitis)    [name] at [place]  . Iron deficiency anemia, unspecified 06/27/2013   Has required IV iron on multiple occasions ([name]).  Hemoccults neg 12/2016.  . Lumbar back pain    Spinal stenosis, lumbar region with neurogenic claudication L5-S1 sx's--ESI's no help.  [name] to do CT myelogram as of 07/2018.  . Major depression, recurrent ([place])    vs dysthymia,chronic  . Memory impairment 10/03/2012   Memory impairment 10/03/2012 >referral to neuro    . Monoclonal gammopathy of undetermined  significance    IgM kappa MGUS ([name]) routine f/u has shown stability of this problem.  . OSA (obstructive sleep apnea)    Latest sleep study 01/2017.  Some adjustments to CPAP being made+ dental referral for consideration of oral appliance --per pulm MD.  . Lebron Quam 04/2014; 03/2018   2015: T score -2.0 FRAX 11%/1.8%.  2019 FRAX 10%/1.5%.  . Otitis externa, candida 69/6295   [name] tx'd her with clotrimazole 1% external solution x 1 mo  . Palpitations   . Psoriasis   . Trochanteric bursitis of both hips    + injections by ortho in the past  . Vitamin D deficiency   . Xerostomia    and xerophthalmia--per [name] (he suggests SSA, SSB, ESR, rh factor, ANA, and antimicrosmal and antithyroglobulin ab's  as of 02/17/16--all these labs were NORMAL.    Past Surgical History:  Procedure Laterality Date  . ABDOMINAL HYSTERECTOMY     for prolapse with abdominal incision  . BACK SURGERY  01/23/2015   Lumbar decompression with L2-L5 fusion ([name])  . CARDIAC CATHETERIZATION     no PCI  . CHOLECYSTECTOMY    . COLONOSCOPY  04/2006   Diverticulosis, o/w normal.  . DEXA  04/06/2018   T score -1.5 femur neck, -2.2 radius.  FRAX 10%/1.5%.  Repeat DEXA 2 yrs.  . ESOPHAGOGASTRODUODENOSCOPY  05/29/2009; 03/01/17   Esoph normal.  Gastritis (H pylori NEG) + gastric polyps (benign).  Duodenal biopsy NORMAL.  2018-mild chronic gastritis, H pylori NEG.  . EYE SURGERY Left    "lazy eye"  . HAND SURGERY Right    TRIGGER FINGER; index finger  . HERNIA REPAIR     umbilical  . KNEE ARTHROSCOPY     RIGHT KNEE X 5  . LAMINOTOMY  2010   L-4, L-5 FUSION  . NASAL SINUS SURGERY     polypectomy (remote past)  . OOPHORECTOMY  2010   Laparoscopic BSO (path benign)  . PATELLA FRACTURE SURGERY  10-08   [name]  . POSTERIOR LAMINECTOMY / DECOMPRESSION LUMBAR SPINE  9-09   [name]  . TOTAL KNEE ARTHROPLASTY  12-07 and 4-07   [name], revision 12-09 [name] at [facility]    Outpatient Medications Prior to Visit  Medication Sig Dispense Refill  . acetaminophen (TYLENOL) 325 MG tablet Take 650 mg by mouth every 6 (six) hours as needed.    . Azelastine HCl (ASTEPRO) 0.15 % SOLN 2 sprays each nostril every 12 hours (Patient taking differently: Place 2 sprays into the nose 2 (two) times daily as needed (Allergies). 2 sprays each nostril every 12 hours) 30 mL 11  . calcium carbonate (OSCAL) 1500 (600 Ca) MG TABS tablet Take by mouth daily.    . Cholecalciferol (VITAMIN D3) 5000 units CAPS Take 2 capsules by mouth daily.    [name] dicyclomine (BENTYL) 10 MG capsule Take 1 capsule (10 mg total) by mouth 3 (three) times daily before meals. 90 capsule 2  . doxylamine, Sleep, (SLEEP AID) 25 MG tablet Take 25 mg by mouth at  bedtime as needed.    . hydrocortisone 2.5 % cream Apply 1 application topically as needed. RECTAL ITCHING    . levothyroxine (SYNTHROID, LEVOTHROID) 75 MCG tablet TAKE 1/2 (ONE-HALF) TABLET BY MOUTH ONCE DAILY 45 tablet 3  . meclizine (ANTIVERT) 25 MG tablet Take 1 tablet (25 mg total) by mouth every 6 (six) hours as needed for dizziness. (Patient taking differently: Take 25 mg by mouth every  6 (six) hours as needed for dizziness. ) 360 tablet 3  . MELATONIN PO Take by mouth.    . meloxicam (MOBIC) 7.5 MG tablet TAKE 2 TABLETS (15 MG TOTAL) BY MOUTH DAILY. 180 tablet 1  . metoprolol tartrate (LOPRESSOR) 25 MG tablet Take 1 tablet (25 mg total) by mouth daily. 90 tablet 1  . Multiple Vitamins-Iron (MULTIVITAMIN/IRON PO) Take 1 tablet by mouth daily.     [name] NIACIN PO Take by mouth.    [name] omeprazole (PRILOSEC) 40 MG capsule Take 1 capsule (40 mg total) by mouth daily. 30 capsule 3  . Oxycodone HCl 10 MG TABS 10 mg every 4 (four) hours as needed. 1 every 6 hours for pain    . Probiotic Product (ALIGN) 4 MG CAPS Take 1 capsule (4 mg total) by mouth daily. 60 capsule 0  . promethazine (PHENERGAN) 25 MG tablet TAKE ONE TABLET BY MOUTH EVERY 6 HOURS AS NEEDED FOR NAUSEA AND VOMITING 90 tablet 3  . Red Yeast Rice Extract (RED YEAST RICE PO) Take by mouth.    . simvastatin (ZOCOR) 20 MG tablet Take 1 tablet (20 mg total) by mouth at bedtime. 90 tablet 0  . Specialty Vitamins Products (MAGNESIUM, AMINO ACID CHELATE,) 133 MG tablet Take 1 tablet by mouth daily.    . traZODone (DESYREL) 50 MG tablet Take 1 tablet by mouth at bedtime.  2  . ALPRAZolam (XANAX) 0.5 MG tablet TAKE ONE-HALF TO ONE TABLET BY MOUTH THREE TIMES DAILY 270 tablet 1  . amoxicillin (AMOXIL) 500 MG capsule 4 tabs po x 1 dose, one hour prior to dental procedure (Patient not taking: Reported on 12/14/2018) 4 capsule 2   No facility-administered medications prior to visit.     Allergies  Allergen Reactions  . Cymbalta [Duloxetine Hcl]      Facial edema  . Iohexol      Code: HIVES, Desc: pt states her face and throat swells when administered IV contrast - KB, Onset Date: [id number]   . Mepergan [Meperidine-Promethazine]     Cardiac arrest  . Morphine Rash  . Augmentin [Amoxicillin-Pot Clavulanate] Nausea Only  . Citalopram Other (See Comments)    Jittery/heart racing   . Hctz [Hydrochlorothiazide] Other (See Comments)    Too much urinating, felt dried out, etc--NO ALLERGY  . Lexapro [Escitalopram Oxalate] Other (See Comments)    Jittery,heart racing  . Lyrica [Pregabalin] Swelling    Face, hands, fingers, ankles  . Valsartan     REACTION: pt states INTOL to Diovan    ROS As per HPI  PE: Blood pressure (!) 141/83, pulse 67, temperature 98.2 F (36.8 C), temperature source Oral, resp. rate 16, height '5\' 6"'$  (1.676 m), weight 184 lb 4 oz (83.6 kg), SpO2 96 %. Gen: Alert, well appearing but tired-appearing.  Patient is oriented to person, place, time, and situation. AFFECT: pleasant, lucid thought and speech. DVV:OHYW: no injection, icteris, swelling, or exudate.  EOMI, PERRLA. Mouth: lips without lesion/swelling.  Oral mucosa pink and moist. Oropharynx without erythema, exudate, or swelling.  Neck - No masses or thyromegaly or limitation in range of motion CV: RRR, no m/r/g.   LUNGS: CTA bilat, nonlabored resps, good aeration in all lung fields. ABD: soft, nondistended.  BS quiet alternating with some high pitched/increased girgles.  No mas, no bruit, no HSM. She has diffuse abd TTP, most prominently in RLQ.  No guarding or rebound tenderness. SKIN: central region of thoracic spine with 2 cm oval pinkish plaque with clearly  demarcated borders and a bit of superficial desquamation.    LABS:    Chemistry      Component Value Date/Time   NA 142 10/05/2018 1108   NA 138 03/25/2017 1333   NA 138 11/25/2016 1147   K 3.8 10/05/2018 1108   K 4.3 03/25/2017 1333   K 3.8 11/25/2016 1147   CL 103 10/05/2018 1108   CL  104 03/25/2017 1333   CL 103 01/15/2010 0835   CO2 29 10/05/2018 1108   CO2 24 03/25/2017 1333   CO2 25 11/25/2016 1147   BUN 12 10/05/2018 1108   BUN 12 03/25/2017 1333   BUN 15.3 11/25/2016 1147   CREATININE 0.93 10/05/2018 1108   CREATININE 0.76 03/25/2017 1333   CREATININE 0.8 11/25/2016 1147      Component Value Date/Time   CALCIUM 9.4 10/05/2018 1108   CALCIUM 9.5 03/25/2017 1333   CALCIUM 9.2 11/25/2016 1147   ALKPHOS 104 10/05/2018 1108   ALKPHOS 111 03/25/2017 1333   ALKPHOS 99 11/25/2016 1147   AST 11 (L) 10/05/2018 1108   AST 24 11/25/2016 1147   ALT 14 10/05/2018 1108   ALT 30 11/25/2016 1147   BILITOT 0.5 10/05/2018 1108   BILITOT 0.61 11/25/2016 1147     Lab Results  Component Value Date   WBC 14.1 (H) 10/05/2018   HGB 14.8 10/05/2018   HCT 45.9 10/05/2018   MCV 91.4 10/05/2018   PLT 260 10/05/2018   Lab Results  Component Value Date   TSH 3.17 01/24/2018   Lab Results  Component Value Date   ESRSEDRATE 37 (H) 02/24/2016   Lab Results  Component Value Date   CKTOTAL 18 07/03/2012   Lab Results  Component Value Date   ANA NEG 02/24/2016    IMPRESSION AND PLAN:  1) Chronic pain syndrome, acute exacerbation (fibromyalgia, osteoarthritis multiple joints): She says in the past she has best responded to alprazolam, has been on the same dose (0.'5mg'$  qhs) for the last 20-30 yrs. Will increase to alpraz 1 mg, 1-2 bid prn, #60, RF x 5 eRx'd today.  Therapeutic expectations and side effect profile of medication discussed today.  Patient's questions answered. Unfortunately, she has to continue going to pain mgmt clinic for opioid treatment. She has not responded to or has had adverse effect from cymbalta and lyrica in the past.   No documented hx of pt having been on gabapentin, savella, or pristiq or tricyclic antidepressant for pain in the past. Will check CPK, TSH, and ESR today.  2) Abd pain, subacute: I think this is primarily constipation but she  is tender enough that she could have acute diverticulitis (uncomplicated).  Will treat with cipro 500 bid x 14d and flagyl 500 mg tid x 14d. Check CBC, CMET, and labs stated above in #1.  3) Chronic constipation: she declined a trial of "designer" med such as amitiza, linzess, or med indicated specifically for opioid induced constipation.  She'll increase her miralax to 1 capful twice EVERY DAY.  Change over from dulcolax tabs to senakot S 2 tabs qd to bid.    4) Eczematous skin lesion: nummular eczema suspected.  Trial of cutivate 0.05% today.  5) Urinary frequency and urgency: sounds like both of these are more of chronic issues than acute. UA w/reflex microscopy sent today + urine c/s.  An After Visit Summary was printed and given to the patient.  FOLLOW UP: Return if symptoms worsen or fail to improve.  Signed:  Crissie Sickles,  MD           12/14/2018

## 2018-12-15 ENCOUNTER — Ambulatory Visit: Payer: Medicare Other | Admitting: Gastroenterology

## 2018-12-15 LAB — URINE CULTURE
MICRO NUMBER:: 356031
SPECIMEN QUALITY:: ADEQUATE

## 2018-12-18 ENCOUNTER — Encounter: Payer: Self-pay | Admitting: Family Medicine

## 2018-12-18 ENCOUNTER — Other Ambulatory Visit: Payer: Self-pay | Admitting: Family Medicine

## 2018-12-18 NOTE — Telephone Encounter (Signed)
Routing to [employer]

## 2018-12-18 NOTE — Telephone Encounter (Signed)
rec'd vm from pt. on [facility] Refill line, requesting 90 day refill supply on Alprazolam 1 mg tablet.  Stated she recently rec'd a 30 day supply, and feels it will be more cost effective for a 90 day supply.  Will send this request to the PCP, for further action.

## 2018-12-18 NOTE — Telephone Encounter (Signed)
Pt called and left a voicemail that states that she would like a 90 fill because she will have a cheaper co pay. Please advise

## 2018-12-18 NOTE — Telephone Encounter (Signed)
Tried to call patient, LMTCB regarding 90 day supply.

## 2018-12-19 NOTE — Telephone Encounter (Signed)
Error - message sent to Dr. [name] Clinical Team

## 2018-12-19 NOTE — Telephone Encounter (Signed)
Forward to [employer]

## 2018-12-20 ENCOUNTER — Other Ambulatory Visit: Payer: Self-pay | Admitting: Family Medicine

## 2018-12-20 MED ORDER — ALPRAZOLAM 1 MG PO TABS: ORAL_TABLET | ORAL | 1 refills | Status: DC

## 2018-12-20 NOTE — Telephone Encounter (Signed)
LMOM for pt to CB.  Need to know if patient has already picked up 30 day supply?

## 2018-12-20 NOTE — Addendum Note (Signed)
Addended by: [name] on: 12/20/2018 11:53 AM   Modules accepted: Orders

## 2018-12-20 NOTE — Telephone Encounter (Signed)
SW pt this morning and she was advised no guarantee for 90 day supply due to med being a controlled substance w/ refills already. Pt expressed it was a cheaper cost for 90 d supply.  Please advise, thanks.

## 2018-12-20 NOTE — Telephone Encounter (Signed)
Patient requesting 90 day rx of alprazolam.  She just picked up 30 day supply dated 12/14/2018.   Please advise.

## 2018-12-20 NOTE — Telephone Encounter (Signed)
Pt called back - she has already picked up rx

## 2018-12-20 NOTE — Telephone Encounter (Signed)
[place] and advised them to d/c the 30 day Rx for alprazolam.   Also, d/c'd in chart.

## 2018-12-20 NOTE — Telephone Encounter (Signed)
I just sent in rx for 90 day supply. Pls make sure with the pharmacy that all other alprazolam rx's are cancelled.-thx

## 2018-12-26 DIAGNOSIS — M1712 Unilateral primary osteoarthritis, left knee: Secondary | ICD-10-CM | POA: Diagnosis not present

## 2018-12-27 ENCOUNTER — Other Ambulatory Visit: Payer: Self-pay | Admitting: Family Medicine

## 2019-01-01 DIAGNOSIS — G894 Chronic pain syndrome: Secondary | ICD-10-CM | POA: Diagnosis not present

## 2019-01-01 DIAGNOSIS — K5900 Constipation, unspecified: Secondary | ICD-10-CM | POA: Diagnosis not present

## 2019-01-01 DIAGNOSIS — M62830 Muscle spasm of back: Secondary | ICD-10-CM | POA: Diagnosis not present

## 2019-01-01 DIAGNOSIS — M961 Postlaminectomy syndrome, not elsewhere classified: Secondary | ICD-10-CM | POA: Diagnosis not present

## 2019-01-08 ENCOUNTER — Encounter: Payer: Self-pay | Admitting: Family Medicine

## 2019-01-16 ENCOUNTER — Telehealth: Payer: Self-pay | Admitting: Family Medicine

## 2019-01-16 NOTE — Telephone Encounter (Signed)
Pt declined and will call back if she starts getting any worse. Appt for Monday has been changed to phone instead due to not having smart phone or computer.

## 2019-01-16 NOTE — Telephone Encounter (Signed)
Called patient to schedule her upcoming 6 month checkup appt on 01/22/19 to a virtual visit-patient declined. Patient asked if there was any way she could schedule an in office visit. She fell recently has would like Dr. [name] to check her head.

## 2019-01-16 NOTE — Telephone Encounter (Signed)
SW pt this morning and she fell on the way to the bathroom and hit her head. She had a knot that has now resolved but feels queasy if she bends over.   Please advise, thanks.

## 2019-01-16 NOTE — Telephone Encounter (Signed)
Needs evaluation in the emergency department ASAP.

## 2019-01-22 ENCOUNTER — Ambulatory Visit: Payer: Medicare Other | Admitting: Family Medicine

## 2019-01-22 ENCOUNTER — Telehealth: Payer: Self-pay

## 2019-01-22 NOTE — Telephone Encounter (Signed)
LMTCB to re-schedule appt due to provider being out.

## 2019-01-24 ENCOUNTER — Ambulatory Visit: Payer: Medicare Other | Admitting: Family Medicine

## 2019-01-25 ENCOUNTER — Encounter: Payer: Self-pay | Admitting: Family Medicine

## 2019-01-25 ENCOUNTER — Other Ambulatory Visit: Payer: Self-pay

## 2019-01-25 ENCOUNTER — Ambulatory Visit (INDEPENDENT_AMBULATORY_CARE_PROVIDER_SITE_OTHER): Payer: Medicare Other | Admitting: Family Medicine

## 2019-01-25 VITALS — BP 140/67 | HR 57

## 2019-01-25 DIAGNOSIS — M797 Fibromyalgia: Secondary | ICD-10-CM | POA: Diagnosis not present

## 2019-01-25 DIAGNOSIS — K5909 Other constipation: Secondary | ICD-10-CM | POA: Diagnosis not present

## 2019-01-25 DIAGNOSIS — R1011 Right upper quadrant pain: Secondary | ICD-10-CM

## 2019-01-25 NOTE — Progress Notes (Signed)
Virtual Visit via Video Note  I connected with pt on 01/25/19 at  1:20 PM EDT by telephone and verified that I am speaking with the correct person using two identifiers.  Location patient: home Location provider:work or home office Persons participating in the virtual visit: patient, provider  I discussed the limitations of evaluation and management by telemedicine and the availability of in person appointments. The patient expressed understanding and agreed to proceed.  Telemedicine/telephone visit is a necessity given the COVID-19 restrictions in place at the current time.  Patient Care Team    Relationship Specialty Notifications Start End  Jadira Nierman, Adrian Blackwater, MD PCP - General Family Medicine  12/17/13    Comment: Transfer from Dr. Lenna Gilford (Merged)  Chesley Mires, MD Consulting Physician Pulmonary Disease  12/17/13   Bjorn Loser, MD Consulting Physician Urology  12/17/13   Bo Merino, MD Consulting Physician Rheumatology  12/17/13   Phineas Real Belinda Block, MD Consulting Physician Gynecology  12/17/13   Almedia Balls, MD Consulting Physician Orthopedic Surgery  12/17/13   [name], MD Consulting Physician Oncology  0/86/76   Jodi Marble, MD Consulting Physician Otolaryngology  02/19/16   Renato Shin, MD Consulting Physician Endocrinology  02/19/16   [place], Holli Humbles, NP Nurse Practitioner Hematology and Oncology  05/26/16   Macarthur Critchley, Arlee Referring Physician Optometry  08/02/16   Lacretia Leigh, DMD  Dentistry  08/02/16   Deneise Lever, MD Consulting Physician Pulmonary Disease  01/21/17   Doran Stabler, MD Consulting Physician Gastroenterology  02/24/17   Margaretha Sheffield, MD Consulting Physician Physical Medicine and Rehabilitation  08/25/17    Comment: [name]  Thurman Coyer, DO Consulting Physician Sports Medicine  02/08/18   Earnie Larsson, MD Consulting Physician Neurosurgery  02/14/18     HPI: 73 y/o WF with whom I am doing a telephone visit today  (due to COVID-19 pandemic restrictions) for f/u fibromyalgia, subacute abd pain, and chronic constipation. Last visit about 5 wks ago she was in the midst of a "flare" of her fibromyalgia.  Since she has responded the most in the past to alprazolam when she gets a flare, I increased her alpraz to 67m tab, take 1-2 bid prn (Labs at that time all normal (see labs secton below).  Urinalysis normal, urine clx showed vaginal flora).  I also treated her for possible acute diverticulitis with cipro and flagyl x 2 wks. I recommended she increase her miralax to 1 capful bid and changed dulcolax to senakot S 2 tabs qd-bid.  Interim hx: Says fibro/musculo pain is back to her baseline. Speaks more about pain in the area of RUQ incisional hernia repair done in the remote past.  Pain focal and radiates through to the back.  No spread to rest of abdominal area, no nausea associated. Described as nawing pain at first, now becoming more of a more frequent moderat to severe sharp ache.  Her lower abd and GI discomfort has let up.  No melena or hematochezia. She still has some hard BMs and strains some most of the time.  Has occ normal bM now. Taking miralax powder qd and sometimes bid, taking dulcolax still and took senakot S for a few days but stopped b/c not helping as well as dulcolax.   ROS: no fever, no cough, no SOB.  Past Medical History:  Diagnosis Date  . Allergic rhinitis   . Anxiety   . Chest pain    cr  . Chronic gastritis  dx'd by EGD 2018 (erosive, likely NSAID-induced).  . Chronic pain syndrome    right knee osteo, hx of TKA in R knee.  Pt was told by [name] that no further surgical options are available for her; also, chronic lower back pain.--[name].  [name] Chronic renal insufficiency, stage II (mild) 2015   vs acute fluctuations depending on hydration?---GFR from 60s up to 80s.  . Diverticulosis of colon    with hx of 'itis  . DJD (degenerative joint disease)     L spine and knees.  R knee inject 03/15/18, L knee inject 12/26/18 (by Dr. Lynann Bologna).  [name] Epigastric pain    As of 01/2017, GI (Dr. Loletha Carrow) planned EGD.  [name] FATIGUE / MALAISE 07/01/2009   Chronic  . Fibromyalgia   . GERD (gastroesophageal reflux disease)   . History of colonic polyps   . HTN (hypertension)   . Hypercholesterolemia    recommended restart of simvastatin 07/2018  . Hypothyroid 07/05/2012  . IBS (irritable bowel syndrome)   . IC (interstitial cystitis)    [name] at [place]  . Iron deficiency anemia, unspecified 06/27/2013   Has required IV iron on multiple occasions ([name]).  Hemoccults neg 12/2016.  . Lumbar back pain    Spinal stenosis, lumbar region with neurogenic claudication L5-S1 sx's--ESI's no help.  [name] to do CT myelogram as of 07/2018.  . Major depression, recurrent ([place])    vs dysthymia,chronic  . Memory impairment 10/03/2012   Memory impairment 10/03/2012 >referral to neuro    . Monoclonal gammopathy of undetermined significance    IgM kappa MGUS ([name]) routine f/u has shown stability of this problem.  . OSA (obstructive sleep apnea)    Latest sleep study 01/2017.  Some adjustments to CPAP being made+ dental referral for consideration of oral appliance --per pulm MD.  . Lebron Quam 04/2014; 03/2018   2015: T score -2.0 FRAX 11%/1.8%.  2019 FRAX 10%/1.5%.  . Otitis externa, candida 85/6314   [name] tx'd her with clotrimazole 1% external solution x 1 mo  . Palpitations   . Psoriasis   . Trochanteric bursitis of both hips    + injections by ortho in the past  . Vitamin D deficiency   . Xerostomia    and xerophthalmia--per [name] (he suggests SSA, SSB, ESR, rh factor, ANA, and antimicrosmal and antithyroglobulin ab's as of 02/17/16--all these labs were NORMAL.    Past Surgical History:  Procedure Laterality Date  . ABDOMINAL HYSTERECTOMY     for prolapse with abdominal incision  . BACK SURGERY  01/23/2015   Lumbar decompression  with L2-L5 fusion ([name])  . CARDIAC CATHETERIZATION     no PCI  . CHOLECYSTECTOMY    . COLONOSCOPY  04/2006   Diverticulosis, o/w normal.  . DEXA  04/06/2018   T score -1.5 femur neck, -2.2 radius.  FRAX 10%/1.5%.  Repeat DEXA 2 yrs.  . ESOPHAGOGASTRODUODENOSCOPY  05/29/2009; 03/01/17   Esoph normal.  Gastritis (H pylori NEG) + gastric polyps (benign).  Duodenal biopsy NORMAL.  2018-mild chronic gastritis, H pylori NEG.  . EYE SURGERY Left    "lazy eye"  . HAND SURGERY Right    TRIGGER FINGER; index finger  . HERNIA REPAIR     umbilical  . KNEE ARTHROSCOPY     RIGHT KNEE X 5  . LAMINOTOMY  2010   L-4, L-5 FUSION  . NASAL SINUS SURGERY     polypectomy (remote past)  .  OOPHORECTOMY  2010   Laparoscopic BSO (path benign)  . PATELLA FRACTURE SURGERY  10-08   [name]  . POSTERIOR LAMINECTOMY / DECOMPRESSION LUMBAR SPINE  9-09   [name]  . TOTAL KNEE ARTHROPLASTY  12-07 and 4-07   [name], revision 12-09 [name] at [facility]    Family History  Problem Relation Age of Onset  . Leukemia Brother   . Lung disease Brother   . Thyroid disease Brother   . Melanoma Brother   . Hypertension Mother   . Thyroid disease Mother   . Hypertension Father   . Breast cancer Sister        Age 42's  . Hypertension Sister   . Coronary artery disease Sister   . Lupus Sister   . Thyroid disease Sister   . Stomach cancer Brother   . Cancer Other        "all over"  . Parkinson's disease Sister      Current Outpatient Medications:  .  acetaminophen (TYLENOL) 325 MG tablet, Take 650 mg by mouth every 6 (six) hours as needed., Disp: , Rfl:  .  ALPRAZolam (XANAX) 1 MG tablet, 1-2 tabs po bid prn, Disp: 180 tablet, Rfl: 1 .  Azelastine HCl (ASTEPRO) 0.15 % SOLN, 2 sprays each nostril every 12 hours (Patient taking differently: Place 2 sprays into the nose 2 (two) times daily as needed (Allergies). 2 sprays each nostril every 12 hours), Disp: 30 mL, Rfl: 11 .  calcium carbonate (OSCAL) 1500  (600 Ca) MG TABS tablet, Take by mouth daily., Disp: , Rfl:  .  Cholecalciferol (VITAMIN D3) 5000 units CAPS, Take 2 capsules by mouth daily., Disp: , Rfl:  .  ciprofloxacin (CIPRO) 500 MG tablet, 1 tab po bid x 14d, Disp: 28 tablet, Rfl: 0 .  dicyclomine (BENTYL) 10 MG capsule, Take 1 capsule (10 mg total) by mouth 3 (three) times daily before meals., Disp: 90 capsule, Rfl: 2 .  doxylamine, Sleep, (SLEEP AID) 25 MG tablet, Take 25 mg by mouth at bedtime as needed., Disp: , Rfl:  .  fluticasone (CUTIVATE) 0.05 % cream, Apply topically 2 (two) times daily., Disp: 30 g, Rfl: 1 .  hydrocortisone 2.5 % cream, Apply 1 application topically as needed. RECTAL ITCHING, Disp: , Rfl:  .  levothyroxine (SYNTHROID, LEVOTHROID) 75 MCG tablet, TAKE 1/2 (ONE-HALF) TABLET BY MOUTH ONCE DAILY, Disp: 45 tablet, Rfl: 3 .  meclizine (ANTIVERT) 25 MG tablet, Take 1 tablet (25 mg total) by mouth every 6 (six) hours as needed for dizziness. (Patient taking differently: Take 25 mg by mouth every 6 (six) hours as needed for dizziness. ), Disp: 360 tablet, Rfl: 3 .  MELATONIN PO, Take by mouth., Disp: , Rfl:  .  meloxicam (MOBIC) 7.5 MG tablet, TAKE 2 TABLETS (15 MG TOTAL) BY MOUTH DAILY., Disp: 180 tablet, Rfl: 1 .  metoprolol tartrate (LOPRESSOR) 25 MG tablet, Take 1 tablet (25 mg total) by mouth daily., Disp: 90 tablet, Rfl: 1 .  Multiple Vitamins-Iron (MULTIVITAMIN/IRON PO), Take 1 tablet by mouth daily. , Disp: , Rfl:  .  NIACIN PO, Take by mouth., Disp: , Rfl:  .  omeprazole (PRILOSEC) 40 MG capsule, Take 1 capsule (40 mg total) by mouth daily., Disp: 30 capsule, Rfl: 3 .  Oxycodone HCl 10 MG TABS, 10 mg every 4 (four) hours as needed. 1 every 6 hours for pain, Disp: , Rfl:  .  Probiotic Product (ALIGN) 4 MG CAPS, Take 1 capsule (4  mg total) by mouth daily., Disp: 60 capsule, Rfl: 0 .  promethazine (PHENERGAN) 25 MG tablet, TAKE ONE TABLET BY MOUTH EVERY 6 HOURS AS NEEDED FOR NAUSEA AND VOMITING, Disp: 90 tablet, Rfl:  3 .  Red Yeast Rice Extract (RED YEAST RICE PO), Take by mouth., Disp: , Rfl:  .  simvastatin (ZOCOR) 20 MG tablet, Take 1 tablet (20 mg total) by mouth at bedtime., Disp: 90 tablet, Rfl: 0 .  Specialty Vitamins Products (MAGNESIUM, AMINO ACID CHELATE,) 133 MG tablet, Take 1 tablet by mouth daily., Disp: , Rfl:  .  traZODone (DESYREL) 50 MG tablet, Take 1 tablet by mouth at bedtime., Disp: , Rfl: 2  EXAM:  VITALS per patient if applicable: BP 944/96 (BP Location: Left Arm, Patient Position: Sitting, Cuff Size: Normal)   Pulse (!) 57  Gen: alert, pleasant, lucid thought and speech. No further exam b/c telephone visit.  LABS: none today  Lab Results  Component Value Date   WBC 9.7 12/14/2018   HGB 14.9 12/14/2018   HCT 43.1 12/14/2018   MCV 86.0 12/14/2018   PLT 232.0 12/14/2018     Chemistry      Component Value Date/Time   NA 138 12/14/2018 1348   NA 138 03/25/2017 1333   NA 138 11/25/2016 1147   K 4.2 12/14/2018 1348   K 4.3 03/25/2017 1333   K 3.8 11/25/2016 1147   CL 101 12/14/2018 1348   CL 104 03/25/2017 1333   CL 103 01/15/2010 0835   CO2 28 12/14/2018 1348   CO2 24 03/25/2017 1333   CO2 25 11/25/2016 1147   BUN 24 (H) 12/14/2018 1348   BUN 12 03/25/2017 1333   BUN 15.3 11/25/2016 1147   CREATININE 0.91 12/14/2018 1348   CREATININE 0.93 10/05/2018 1108   CREATININE 0.76 03/25/2017 1333   CREATININE 0.8 11/25/2016 1147      Component Value Date/Time   CALCIUM 9.9 12/14/2018 1348   CALCIUM 9.5 03/25/2017 1333   CALCIUM 9.2 11/25/2016 1147   ALKPHOS 91 12/14/2018 1348   ALKPHOS 111 03/25/2017 1333   ALKPHOS 99 11/25/2016 1147   AST 18 12/14/2018 1348   AST 11 (L) 10/05/2018 1108   AST 24 11/25/2016 1147   ALT 21 12/14/2018 1348   ALT 14 10/05/2018 1108   ALT 30 11/25/2016 1147   BILITOT 0.4 12/14/2018 1348   BILITOT 0.5 10/05/2018 1108   BILITOT 0.61 11/25/2016 1147     Lab Results  Component Value Date   ESRSEDRATE 31 (H) 12/14/2018   Lab  Results  Component Value Date   TSH 3.98 12/14/2018   Lab Results  Component Value Date   CKTOTAL 61 12/14/2018    ASSESSMENT AND PLAN:  Discussed the following assessment and plan:  1) Abdominal pain, focal R mid abd/RUQ at the site of prior incisional hernia repair. Pt very bothered by it, feels like "I can feel something there with my hand" but denies any protrusion. At any rate, she is not having any functional GI symptoms with this or having any fevers, and her bowel habits seem to be at baseline or possibly even having a little less problem with constipation than her normal. Watchful waiting for now but I think we'll likely have to check CT abd in future after I can see her in office and do an exam.  2) Abdominal pain, lower quadrants: question of diverticulitis-->seems to have resolved with abx.  3) Fibromyalgia: her flare has calmed down some.  4)  Chronic constipation: she really wants to just stick with miralax 1-2 times per day and dulcolax. Senakot S not very helpful for her.  She declines trial of amitiza or linzess due to cost.    I discussed the assessment and treatment plan with the patient. The patient was provided an opportunity to ask questions and all were answered. The patient agreed with the plan and demonstrated an understanding of the instructions.   The patient was advised to call back or seek an in-person evaluation if the symptoms worsen or if the condition fails to improve as anticipated.  Spent 15 min with pt today, with >50% of this time spent in counseling and care coordination regarding the above problems.  F/u: 1 mo in office.  Signed:  Crissie Sickles, MD           01/25/2019

## 2019-01-29 DIAGNOSIS — M961 Postlaminectomy syndrome, not elsewhere classified: Secondary | ICD-10-CM | POA: Diagnosis not present

## 2019-01-29 DIAGNOSIS — M62830 Muscle spasm of back: Secondary | ICD-10-CM | POA: Diagnosis not present

## 2019-01-29 DIAGNOSIS — G894 Chronic pain syndrome: Secondary | ICD-10-CM | POA: Diagnosis not present

## 2019-01-29 DIAGNOSIS — K5900 Constipation, unspecified: Secondary | ICD-10-CM | POA: Diagnosis not present

## 2019-01-31 ENCOUNTER — Inpatient Hospital Stay: Payer: Medicare Other | Attending: Hematology & Oncology | Admitting: Hematology & Oncology

## 2019-01-31 ENCOUNTER — Encounter: Payer: Self-pay | Admitting: Hematology & Oncology

## 2019-01-31 ENCOUNTER — Inpatient Hospital Stay: Payer: Medicare Other

## 2019-01-31 ENCOUNTER — Other Ambulatory Visit: Payer: Self-pay

## 2019-01-31 VITALS — BP 130/62 | HR 57 | Temp 97.6°F | Resp 20 | Wt 182.1 lb

## 2019-01-31 DIAGNOSIS — D472 Monoclonal gammopathy: Secondary | ICD-10-CM

## 2019-01-31 DIAGNOSIS — D500 Iron deficiency anemia secondary to blood loss (chronic): Secondary | ICD-10-CM

## 2019-01-31 DIAGNOSIS — E611 Iron deficiency: Secondary | ICD-10-CM | POA: Insufficient documentation

## 2019-01-31 LAB — CBC WITH DIFFERENTIAL (CANCER CENTER ONLY)
Abs Immature Granulocytes: 0.02 10*3/uL (ref 0.00–0.07)
Basophils Absolute: 0.1 10*3/uL (ref 0.0–0.1)
Basophils Relative: 1 %
Eosinophils Absolute: 0.4 10*3/uL (ref 0.0–0.5)
Eosinophils Relative: 5 %
HCT: 41.3 % (ref 36.0–46.0)
Hemoglobin: 13.7 g/dL (ref 12.0–15.0)
Immature Granulocytes: 0 %
Lymphocytes Relative: 31 %
Lymphs Abs: 2.3 10*3/uL (ref 0.7–4.0)
MCH: 29.1 pg (ref 26.0–34.0)
MCHC: 33.2 g/dL (ref 30.0–36.0)
MCV: 87.7 fL (ref 80.0–100.0)
Monocytes Absolute: 0.4 10*3/uL (ref 0.1–1.0)
Monocytes Relative: 6 %
Neutro Abs: 4.4 10*3/uL (ref 1.7–7.7)
Neutrophils Relative %: 57 %
Platelet Count: 176 10*3/uL (ref 150–400)
RBC: 4.71 MIL/uL (ref 3.87–5.11)
RDW: 11.9 % (ref 11.5–15.5)
WBC Count: 7.5 10*3/uL (ref 4.0–10.5)
nRBC: 0 % (ref 0.0–0.2)

## 2019-01-31 LAB — CMP (CANCER CENTER ONLY)
ALT: 14 U/L (ref 0–44)
AST: 13 U/L — ABNORMAL LOW (ref 15–41)
Albumin: 4.5 g/dL (ref 3.5–5.0)
Alkaline Phosphatase: 82 U/L (ref 38–126)
Anion gap: 6 (ref 5–15)
BUN: 25 mg/dL — ABNORMAL HIGH (ref 8–23)
CO2: 30 mmol/L (ref 22–32)
Calcium: 9.9 mg/dL (ref 8.9–10.3)
Chloride: 102 mmol/L (ref 98–111)
Creatinine: 0.96 mg/dL (ref 0.44–1.00)
GFR, Est AFR Am: >60 mL/min
GFR, Estimated: 59 mL/min — ABNORMAL LOW
Glucose, Bld: 112 mg/dL — ABNORMAL HIGH (ref 70–99)
Potassium: 4.5 mmol/L (ref 3.5–5.1)
Sodium: 138 mmol/L (ref 135–145)
Total Bilirubin: 0.5 mg/dL (ref 0.3–1.2)
Total Protein: 7.1 g/dL (ref 6.5–8.1)

## 2019-01-31 NOTE — Progress Notes (Signed)
Hematology and Oncology Follow Up Visit  [name] [id number] October 27, 1945 73 y.o. 10/05/2018   Principle Diagnosis:  1. IgM kappa monoclonal gammopathy of undetermined significance 2. Intermittent iron deficiency anemia  Current Therapy:   IV iron as indicated - last received in July 2019   Interim History:  [name] is here today for follow-up.  She is managing the coronavirus.  Unfortunately, she has to be at home.  Her husband is still drinking.  Hopefully, and thankfully, she has not been hurt.  She still has some occasional pains.  There is been no issues with her bowels or bladder.  We last saw her, her M spike was 0.5 g/dL.  Her IgM level is 881 mg/dL.  Again I have to watch this closely.  If the IgM level is higher, we may have to do some scans on her.  Ultimately, we may have to do a bone marrow biopsy on her.  Currently, her performance status is ECOG 1.  Medications:  Allergies as of 10/05/2018      Reactions   Cymbalta [duloxetine Hcl]    Facial edema   Iohexol     Code: HIVES, Desc: pt states her face and throat swells when administered IV contrast - KB, Onset Date: 16073710   Mepergan [meperidine-promethazine]    Cardiac arrest   Morphine Rash   Augmentin [amoxicillin-pot Clavulanate] Nausea Only   Citalopram Other (See Comments)   Jittery/heart racing   Hctz [hydrochlorothiazide] Other (See Comments)   Too much urinating, felt dried out, etc--NO ALLERGY   Lexapro [escitalopram Oxalate] Other (See Comments)   Jittery,heart racing   Lyrica [pregabalin] Swelling   Face, hands, fingers, ankles   Valsartan    REACTION: pt states INTOL to Diovan      Medication List       Accurate as of October 05, 2018 12:55 PM. Always use your most recent med list.        acetaminophen 325 MG tablet Commonly known as:  TYLENOL Take 650 mg by mouth every 6 (six) hours as needed.   ALIGN 4 MG Caps Take 1 capsule (4 mg total) by mouth daily.   ALPRAZolam  0.5 MG tablet Commonly known as:  XANAX TAKE ONE-HALF TO ONE TABLET BY MOUTH THREE TIMES DAILY   Azelastine HCl 0.15 % Soln Commonly known as:  ASTEPRO 2 sprays each nostril every 12 hours   calcium carbonate 1500 (600 Ca) MG Tabs tablet Commonly known as:  OSCAL Take by mouth daily.   dicyclomine 10 MG capsule Commonly known as:  BENTYL Take 1 capsule (10 mg total) by mouth 3 (three) times daily before meals.   hydrocortisone 2.5 % cream Apply 1 application topically as needed. RECTAL ITCHING   levothyroxine 75 MCG tablet Commonly known as:  SYNTHROID, LEVOTHROID TAKE 1/2 (ONE-HALF) TABLET BY MOUTH ONCE DAILY   magnesium (amino acid chelate) 133 MG tablet Take 1 tablet by mouth daily.   meclizine 25 MG tablet Commonly known as:  ANTIVERT Take 1 tablet (25 mg total) by mouth every 6 (six) hours as needed for dizziness.   MELATONIN PO Take by mouth.   meloxicam 7.5 MG tablet Commonly known as:  MOBIC TAKE 2 TABLETS (15 MG TOTAL) BY MOUTH DAILY.   metoprolol tartrate 25 MG tablet Commonly known as:  LOPRESSOR Take 1 tablet (25 mg total) by mouth daily.   MULTIVITAMIN/IRON PO Take 1 tablet by mouth daily.   NIACIN PO Take by mouth.   omeprazole  40 MG capsule Commonly known as:  PRILOSEC Take 1 capsule (40 mg total) by mouth daily.   Oxycodone HCl 10 MG Tabs 10 mg every 4 (four) hours as needed. 1 every 6 hours for pain   promethazine 25 MG tablet Commonly known as:  PHENERGAN TAKE ONE TABLET BY MOUTH EVERY 6 HOURS AS NEEDED FOR NAUSEA AND VOMITING   RED YEAST RICE PO Take by mouth.   simvastatin 20 MG tablet Commonly known as:  ZOCOR Take 1 tablet (20 mg total) by mouth at bedtime.   SLEEP AID 25 MG tablet Generic drug:  doxylamine (Sleep) Take 25 mg by mouth at bedtime as needed.   traZODone 50 MG tablet Commonly known as:  DESYREL Take 1 tablet by mouth at bedtime.   Vitamin D3 125 MCG (5000 UT) Caps Take 2 capsules by mouth daily.        Allergies:  Allergies  Allergen Reactions  . Cymbalta [Duloxetine Hcl]     Facial edema  . Iohexol      Code: HIVES, Desc: pt states her face and throat swells when administered IV contrast - KB, Onset Date: 50354656   . Mepergan [Meperidine-Promethazine]     Cardiac arrest  . Morphine Rash  . Augmentin [Amoxicillin-Pot Clavulanate] Nausea Only  . Citalopram Other (See Comments)    Jittery/heart racing   . Hctz [Hydrochlorothiazide] Other (See Comments)    Too much urinating, felt dried out, etc--NO ALLERGY  . Lexapro [Escitalopram Oxalate] Other (See Comments)    Jittery,heart racing  . Lyrica [Pregabalin] Swelling    Face, hands, fingers, ankles  . Valsartan     REACTION: pt states INTOL to Diovan    Past Medical History, Surgical history, Social history, and Family History were reviewed and updated.  Review of Systems: Review of Systems  Constitutional: Positive for malaise/fatigue.  Eyes: Positive for blurred vision.  Respiratory: Positive for wheezing.   Cardiovascular: Positive for palpitations.  Gastrointestinal: Positive for nausea.  Genitourinary: Positive for urgency.  Musculoskeletal: Positive for back pain, joint pain and myalgias.  Neurological: Positive for tingling.  Endo/Heme/Allergies: Negative.   Psychiatric/Behavioral: Negative.      Physical Exam:  weight is 174 lb (78.9 kg). Her oral temperature is 97.8 F (36.6 C). Her blood pressure is 135/52 (abnormal) and her pulse is 52 (abnormal). Her respiration is 18 and oxygen saturation is 98%.   Wt Readings from Last 3 Encounters:  10/05/18 174 lb (78.9 kg)  09/05/18 178 lb (80.7 kg)  07/24/18 174 lb 8 oz (79.2 kg)    Physical Exam Vitals signs reviewed.  HENT:     Head: Normocephalic and atraumatic.  Eyes:     Pupils: Pupils are equal, round, and reactive to light.  Neck:     Musculoskeletal: Normal range of motion.  Cardiovascular:     Rate and Rhythm: Normal rate and regular rhythm.      Heart sounds: Normal heart sounds.  Pulmonary:     Effort: Pulmonary effort is normal.     Breath sounds: Normal breath sounds.  Abdominal:     General: Bowel sounds are normal.     Palpations: Abdomen is soft.  Musculoskeletal: Normal range of motion.        General: No tenderness or deformity.  Lymphadenopathy:     Cervical: No cervical adenopathy.  Skin:    General: Skin is warm and dry.     Findings: No erythema or rash.  Neurological:     Mental Status:  She is alert and oriented to person, place, and time.  Psychiatric:        Behavior: Behavior normal.        Thought Content: Thought content normal.        Judgment: Judgment normal.     Lab Results  Component Value Date   WBC 14.1 (H) 10/05/2018   HGB 14.8 10/05/2018   HCT 45.9 10/05/2018   MCV 91.4 10/05/2018   PLT 260 10/05/2018   Lab Results  Component Value Date   FERRITIN 143 04/06/2018   IRON 31 (L) 04/06/2018   TIBC 284 04/06/2018   UIBC 253 04/06/2018   IRONPCTSAT 11 (L) 04/06/2018   Lab Results  Component Value Date   RETICCTPCT 1.2 01/20/2011   RBC 5.02 10/05/2018   RETICCTABS 52.2 01/20/2011   Lab Results  Component Value Date   KPAFRELGTCHN 17.3 04/06/2018   LAMBDASER 16.6 04/06/2018   KAPLAMBRATIO 1.04 04/06/2018   Lab Results  Component Value Date   IGGSERUM 667 (L) 04/06/2018   IGA 287 04/06/2018   IGMSERUM 630 (H) 04/06/2018   Lab Results  Component Value Date   TOTALPROTELP 6.7 04/06/2018   ALBUMINELP 3.3 04/06/2018   A1GS 0.2 04/06/2018   A2GS 0.9 04/06/2018   BETS 1.0 04/06/2018   BETA2SER 0.5 06/19/2015   GAMS 1.2 04/06/2018   MSPIKE 0.5 (H) 04/06/2018   SPEI Comment 10/06/2017     Chemistry      Component Value Date/Time   NA 142 10/05/2018 1108   NA 138 03/25/2017 1333   NA 138 11/25/2016 1147   K 3.8 10/05/2018 1108   K 4.3 03/25/2017 1333   K 3.8 11/25/2016 1147   CL 103 10/05/2018 1108   CL 104 03/25/2017 1333   CL 103 01/15/2010 0835   CO2 29 10/05/2018  1108   CO2 24 03/25/2017 1333   CO2 25 11/25/2016 1147   BUN 12 10/05/2018 1108   BUN 12 03/25/2017 1333   BUN 15.3 11/25/2016 1147   CREATININE 0.93 10/05/2018 1108   CREATININE 0.76 03/25/2017 1333   CREATININE 0.8 11/25/2016 1147      Component Value Date/Time   CALCIUM 9.4 10/05/2018 1108   CALCIUM 9.5 03/25/2017 1333   CALCIUM 9.2 11/25/2016 1147   ALKPHOS 104 10/05/2018 1108   ALKPHOS 111 03/25/2017 1333   ALKPHOS 99 11/25/2016 1147   AST 11 (L) 10/05/2018 1108   AST 24 11/25/2016 1147   ALT 14 10/05/2018 1108   ALT 30 11/25/2016 1147   BILITOT 0.5 10/05/2018 1108   BILITOT 0.61 11/25/2016 1147      Impression and Plan: [name] is a very pleasant 73 yo caucasian female with MGUS and history of iron deficiency.  She has an IgM MGUS.  This we will have to watch closely.  We have been watching her for several years.  So far, we have not seen anything that shows progression to a lymphoplasmacytic lymphoma or myeloma.  We will go ahead and plan to get her back in 4 months.  It is always nice to talk with her.  We always have good fellowship.  [name], MD 1/16/202012:55 PM

## 2019-02-01 LAB — KAPPA/LAMBDA LIGHT CHAINS
Kappa free light chain: 16.2 mg/L (ref 3.3–19.4)
Kappa, lambda light chain ratio: 0.95 (ref 0.26–1.65)
Lambda free light chains: 17 mg/L (ref 5.7–26.3)

## 2019-02-01 LAB — IGG, IGA, IGM
IgA: 253 mg/dL (ref 64–422)
IgG (Immunoglobin G), Serum: 710 mg/dL (ref 586–1602)
IgM (Immunoglobulin M), Srm: 562 mg/dL — ABNORMAL HIGH (ref 26–217)

## 2019-02-01 LAB — IRON AND TIBC
Iron: 93 ug/dL (ref 41–142)
Saturation Ratios: 34 % (ref 21–57)
TIBC: 278 ug/dL (ref 236–444)
UIBC: 184 ug/dL (ref 120–384)

## 2019-02-01 LAB — FERRITIN: Ferritin: 203 ng/mL (ref 11–307)

## 2019-02-02 LAB — PROTEIN ELECTROPHORESIS, SERUM, WITH REFLEX
A/G Ratio: 1.3 (ref 0.7–1.7)
Albumin ELP: 4 g/dL (ref 2.9–4.4)
Alpha-1-Globulin: 0.2 g/dL (ref 0.0–0.4)
Alpha-2-Globulin: 0.8 g/dL (ref 0.4–1.0)
Beta Globulin: 1 g/dL (ref 0.7–1.3)
Gamma Globulin: 1.1 g/dL (ref 0.4–1.8)
Globulin, Total: 3.1 g/dL (ref 2.2–3.9)
M-Spike, %: 0.4 g/dL — ABNORMAL HIGH
SPEP Interpretation: 0
Total Protein ELP: 7.1 g/dL (ref 6.0–8.5)

## 2019-02-02 LAB — IMMUNOFIXATION REFLEX, SERUM
IgA: 261 mg/dL (ref 64–422)
IgG (Immunoglobin G), Serum: 730 mg/dL (ref 586–1602)
IgM (Immunoglobulin M), Srm: 584 mg/dL — ABNORMAL HIGH (ref 26–217)

## 2019-02-05 ENCOUNTER — Other Ambulatory Visit: Payer: Self-pay | Admitting: Family Medicine

## 2019-02-22 ENCOUNTER — Other Ambulatory Visit: Payer: Self-pay | Admitting: Family Medicine

## 2019-02-22 ENCOUNTER — Ambulatory Visit (INDEPENDENT_AMBULATORY_CARE_PROVIDER_SITE_OTHER): Payer: Medicare Other | Admitting: Family Medicine

## 2019-02-22 ENCOUNTER — Encounter: Payer: Self-pay | Admitting: Family Medicine

## 2019-02-22 ENCOUNTER — Other Ambulatory Visit: Payer: Self-pay

## 2019-02-22 VITALS — BP 135/79 | HR 63 | Temp 96.7°F | Resp 16 | Ht 66.0 in | Wt 185.6 lb

## 2019-02-22 DIAGNOSIS — M797 Fibromyalgia: Secondary | ICD-10-CM

## 2019-02-22 DIAGNOSIS — R1011 Right upper quadrant pain: Secondary | ICD-10-CM

## 2019-02-22 DIAGNOSIS — R1901 Right upper quadrant abdominal swelling, mass and lump: Secondary | ICD-10-CM | POA: Diagnosis not present

## 2019-02-22 MED ORDER — LEVETIRACETAM 500 MG PO TABS: 500.0000 mg | ORAL_TABLET | Freq: Two times a day (BID) | ORAL | 0 refills | Status: DC

## 2019-02-22 MED ORDER — PREDNISONE 5 MG PO TABS: ORAL_TABLET | ORAL | 0 refills | Status: DC

## 2019-02-22 NOTE — Patient Instructions (Addendum)
Take one leviteracetam tab every night for 7d, then take one every morning and one every night until I see you again for follow up in 2 weeks.

## 2019-02-22 NOTE — Progress Notes (Signed)
A user error has taken place: encounter opened in error, closed for administrative reasons.

## 2019-02-22 NOTE — Progress Notes (Addendum)
OFFICE VISIT  02/22/2019   CC:  Chief Complaint  Patient presents with  . Follow-up    abd pain    HPI:    Patient is a 73 y.o. Caucasian female who presents for 1 mo f/u abdominal pain. She has had all manner of abd pain over the years, most recently when I saw her she was having persistent RUQ pain assoc with "feeling like there is something there" at the site of a scar from previous abd wall hernia repair.  This particular pain has been present about 8-12 mo in her best estimation and has gradually been worsening. Her pain is still constant in right mid abd (at the level of umbilicus on R abd) and radiates through to her back.  Constant ache but sometimes severe.  No clear exacerbating factor such as body position, eating, or time of day.  No n/v.  No other areas of abdomen hurt on any consistent basis with this pain.  She feels a knot in the area of pain which is located about 2 inches inferior to an incisional hernia repair scar (mesh used).  Constipation is controlled lately.  No diarrhea.  No hematochezia or melena.  She has the following problems that could play a role in abd pain (see PMH/PSH for details): IBS Chronic constipation Chronic gastritis Hx of hysterectomy and cholecystectomy. Hx of incision/abd wall hernia repair. Hx of bilat salpingo-oopherectomy Recurrent diverticulitis. Chronic/recurrent MDD. Fibromyalgia.  Problem #2 today: severe generalized body pain x 3 wks approximately.  "I hurt bad from the tips of my toes to the top of my head".  Cannot localize the pain to joints or muscles-"It's just everywhere". This is consistent with a fibromyalgia flare for her, but is the most intense she has ever had per her estimation. No joint swelling, no rashes, no oral ulcers, no fevers.  She has been taking meloxicam, oxycodone, and tramadol as per her chronic pain MD treatments.   Has tried a list of meds for fibromyalgia but nothing has worked.  Review of Systems   Constitutional: Positive for fatigue. Negative for appetite change, chills and fever.  HENT: Negative for congestion, dental problem, ear pain, mouth sores and sore throat.   Eyes: Negative for discharge, redness and visual disturbance.  Respiratory: Negative for cough, chest tightness, shortness of breath and wheezing.   Cardiovascular: Negative for chest pain, palpitations and leg swelling.  Gastrointestinal: Negative for abdominal pain, blood in stool, diarrhea, nausea and vomiting.  Endocrine: Negative for cold intolerance, heat intolerance, polydipsia, polyphagia and polyuria.  Genitourinary: Negative for difficulty urinating, dysuria, flank pain, frequency, hematuria and urgency.  Musculoskeletal: Positive for arthralgias and myalgias. Negative for back pain, joint swelling and neck stiffness.  Skin: Negative for pallor and rash.  Allergic/Immunologic: Negative for immunocompromised state.  Neurological: Negative for dizziness, speech difficulty, weakness and headaches.  Hematological: Negative for adenopathy. Does not bruise/bleed easily.  Psychiatric/Behavioral: Positive for dysphoric mood. Negative for confusion and sleep disturbance. The patient is not nervous/anxious.     Past Medical History:  Diagnosis Date  . Allergic rhinitis   . Anxiety   . Chest pain    cr  . Chronic gastritis    dx'd by EGD 2018 (erosive, likely NSAID-induced).  . Chronic pain syndrome    right knee osteo, hx of TKA in R knee.  Pt was told by [name] that no further surgical options are available for her; also, chronic lower back pain.--[name].  [name] Chronic renal  insufficiency, stage II (mild) 2015   vs acute fluctuations depending on hydration?---GFR from 60s up to 80s.  . Diverticulosis of colon    with hx of 'itis  . DJD (degenerative joint disease)    L spine and knees.  R knee inject 03/15/18, L knee inject 12/26/18 (by Dr. Lynann Bologna).  [name] Epigastric pain    As of 01/2017, GI (Dr.  Loletha Carrow) planned EGD.  [name] FATIGUE / MALAISE 07/01/2009   Chronic  . Fibromyalgia   . GERD (gastroesophageal reflux disease)   . History of colonic polyps   . HTN (hypertension)   . Hypercholesterolemia    recommended restart of simvastatin 07/2018  . Hypothyroid 07/05/2012  . IBS (irritable bowel syndrome)   . IC (interstitial cystitis)    [name] at [place]  . Iron deficiency anemia, unspecified 06/27/2013   Has required IV iron on multiple occasions ([name]).  Hemoccults neg 12/2016.  . Lumbar back pain    Spinal stenosis, lumbar region with neurogenic claudication L5-S1 sx's--ESI's no help.  [name] to do CT myelogram as of 07/2018.  . Major depression, recurrent ([place])    vs dysthymia,chronic  . Memory impairment 10/03/2012   Memory impairment 10/03/2012 >referral to neuro    . Monoclonal gammopathy of undetermined significance    IgM kappa MGUS ([name]) routine f/u has shown stability of this problem.  . OSA (obstructive sleep apnea)    Latest sleep study 01/2017.  Some adjustments to CPAP being made+ dental referral for consideration of oral appliance --per pulm MD.  . Lebron Quam 04/2014; 03/2018   2015: T score -2.0 FRAX 11%/1.8%.  2019 FRAX 10%/1.5%.  . Otitis externa, candida 16/6063   [name] tx'd her with clotrimazole 1% external solution x 1 mo  . Palpitations   . Psoriasis   . Trochanteric bursitis of both hips    + injections by ortho in the past  . Vitamin D deficiency   . Xerostomia    and xerophthalmia--per [name] (he suggests SSA, SSB, ESR, rh factor, ANA, and antimicrosmal and antithyroglobulin ab's as of 02/17/16--all these labs were NORMAL.    Past Surgical History:  Procedure Laterality Date  . ABDOMINAL HYSTERECTOMY     for prolapse with abdominal incision  . BACK SURGERY  01/23/2015   Lumbar decompression with L2-L5 fusion ([name])  . CARDIAC CATHETERIZATION     no PCI  . CHOLECYSTECTOMY    . COLONOSCOPY  04/2006    Diverticulosis, o/w normal.  . DEXA  04/06/2018   T score -1.5 femur neck, -2.2 radius.  FRAX 10%/1.5%.  Repeat DEXA 2 yrs.  . ESOPHAGOGASTRODUODENOSCOPY  05/29/2009; 03/01/17   Esoph normal.  Gastritis (H pylori NEG) + gastric polyps (benign).  Duodenal biopsy NORMAL.  2018-mild chronic gastritis, H pylori NEG.  . EYE SURGERY Left    "lazy eye"  . HAND SURGERY Right    TRIGGER FINGER; index finger  . HERNIA REPAIR     umbilical  . KNEE ARTHROSCOPY     RIGHT KNEE X 5  . LAMINOTOMY  2010   L-4, L-5 FUSION  . NASAL SINUS SURGERY     polypectomy (remote past)  . OOPHORECTOMY  2010   Laparoscopic BSO (path benign)  . PATELLA FRACTURE SURGERY  10-08   [name]  . POSTERIOR LAMINECTOMY / DECOMPRESSION LUMBAR SPINE  9-09   [name]  . TOTAL KNEE ARTHROPLASTY  12-07 and 4-07   [name], revision 12-09 [name] at  [facility]    Outpatient Medications Prior to Visit  Medication Sig Dispense Refill  . acetaminophen (TYLENOL) 325 MG tablet Take 650 mg by mouth every 6 (six) hours as needed.    . ALPRAZolam (XANAX) 1 MG tablet 1-2 tabs po bid prn 180 tablet 1  . calcium carbonate (OSCAL) 1500 (600 Ca) MG TABS tablet Take by mouth daily.    . Cholecalciferol (VITAMIN D3) 5000 units CAPS Take 4 capsules by mouth daily.     [name] doxylamine, Sleep, (SLEEP AID) 25 MG tablet Take 25 mg by mouth at bedtime as needed.    . fluticasone (CUTIVATE) 0.05 % cream Apply topically 2 (two) times daily. 30 g 1  . hydrocortisone 2.5 % cream Apply 1 application topically as needed. RECTAL ITCHING    . levothyroxine (SYNTHROID, LEVOTHROID) 75 MCG tablet TAKE 1/2 (ONE-HALF) TABLET BY MOUTH ONCE DAILY 45 tablet 3  . meloxicam (MOBIC) 7.5 MG tablet TAKE 2 TABLETS (15 MG TOTAL) BY MOUTH DAILY. 180 tablet 1  . metoprolol tartrate (LOPRESSOR) 25 MG tablet Take 1 tablet by mouth once daily 90 tablet 0  . Multiple Vitamins-Iron (MULTIVITAMIN/IRON PO) Take 1 tablet by mouth daily.     [name] NIACIN PO Take by mouth daily.     [name]  omeprazole (PRILOSEC) 40 MG capsule Take 1 capsule (40 mg total) by mouth daily. 30 capsule 3  . Oxycodone HCl 10 MG TABS 10 mg every 4 (four) hours as needed. 1 every 6 hours for pain    . Probiotic Product (ALIGN) 4 MG CAPS Take 1 capsule (4 mg total) by mouth daily. 60 capsule 0  . simvastatin (ZOCOR) 20 MG tablet Take 1 tablet (20 mg total) by mouth at bedtime. 90 tablet 0  . Specialty Vitamins Products (MAGNESIUM, AMINO ACID CHELATE,) 133 MG tablet Take 1 tablet by mouth daily.    . traZODone (DESYREL) 50 MG tablet Take 1 tablet by mouth at bedtime.  2  . Azelastine HCl (ASTEPRO) 0.15 % SOLN 2 sprays each nostril every 12 hours (Patient not taking: Reported on 02/22/2019) 30 mL 11  . meclizine (ANTIVERT) 25 MG tablet Take 1 tablet (25 mg total) by mouth every 6 (six) hours as needed for dizziness. (Patient not taking: Reported on 02/22/2019) 360 tablet 3  . promethazine (PHENERGAN) 25 MG tablet TAKE ONE TABLET BY MOUTH EVERY 6 HOURS AS NEEDED FOR NAUSEA AND VOMITING (Patient not taking: Reported on 02/22/2019) 90 tablet 3  . ciprofloxacin (CIPRO) 500 MG tablet 1 tab po bid x 14d 28 tablet 0  . dicyclomine (BENTYL) 10 MG capsule Take 1 capsule (10 mg total) by mouth 3 (three) times daily before meals. 90 capsule 2  . MELATONIN PO Take by mouth.    . Red Yeast Rice Extract (RED YEAST RICE PO) Take by mouth.     No facility-administered medications prior to visit.     Allergies  Allergen Reactions  . Cymbalta [Duloxetine Hcl]     Facial edema  . Iohexol      Code: HIVES, Desc: pt states her face and throat swells when administered IV contrast - KB, Onset Date: [id number]   . Mepergan [Meperidine-Promethazine]     Cardiac arrest  . Morphine Rash  . Augmentin [Amoxicillin-Pot Clavulanate] Nausea Only  . Citalopram Other (See Comments)    Jittery/heart racing   . Hctz [Hydrochlorothiazide] Other (See Comments)    Too much urinating, felt dried out, etc--NO ALLERGY  . Lexapro [Escitalopram  Oxalate] Other (See  Comments)    Jittery,heart racing  . Lyrica [Pregabalin] Swelling    Face, hands, fingers, ankles  . Valsartan     REACTION: pt states INTOL to Diovan    ROS As per HPI  PE: Blood pressure 135/79, pulse 63, temperature (!) 96.7 F (35.9 C), temperature source Temporal, resp. rate 16, height _0  (1.676 m), weight 185 lb 9.6 oz (84.2 kg), SpO2 97 %. Gen: Alert, tired-appearing but NAD.  Patient is oriented to person, place, time, and situation. AFFECT: pleasant, lucid thought and speech. DEY:CXKG: no injection, icteris, swelling, or exudate.  EOMI, PERRLA. Mouth: lips without lesion/swelling.  Oral mucosa pink and moist. Oropharynx without erythema, exudate, or swelling.  CV: RRR, no m/r/g.   LUNGS: CTA bilat, nonlabored resps, good aeration in all lung fields. ABD: soft, TTP in infracostal region diffusely w/out rebound or guarding.  She has mod TTP in right mid abdomen about 4-5 cm inferior to a horizontal surgical scar.  I can feel a slight smooth mass when palpating deeply in the area.  No guarding or rebound.  No HSM.  BS normal.  No bruit. EXT: no clubbing or cyanosis.  no edema.  Joints with NO erythema or swelling.  ROM intact, but stiffness/pain with ROM of hips. She has TTP of posterolateral neck soft tissues, soft tissues of mid back, greater troch areas bilat. Skin: no rash.  LABS:    Chemistry      Component Value Date/Time   NA 138 01/31/2019 1159   NA 138 03/25/2017 1333   NA 138 11/25/2016 1147   K 4.5 01/31/2019 1159   K 4.3 03/25/2017 1333   K 3.8 11/25/2016 1147   CL 102 01/31/2019 1159   CL 104 03/25/2017 1333   CL 103 01/15/2010 0835   CO2 30 01/31/2019 1159   CO2 24 03/25/2017 1333   CO2 25 11/25/2016 1147   BUN 25 (H) 01/31/2019 1159   BUN 12 03/25/2017 1333   BUN 15.3 11/25/2016 1147   CREATININE 0.96 01/31/2019 1159   CREATININE 0.76 03/25/2017 1333   CREATININE 0.8 11/25/2016 1147      Component Value Date/Time   CALCIUM  9.9 01/31/2019 1159   CALCIUM 9.5 03/25/2017 1333   CALCIUM 9.2 11/25/2016 1147   ALKPHOS 82 01/31/2019 1159   ALKPHOS 111 03/25/2017 1333   ALKPHOS 99 11/25/2016 1147   AST 13 (L) 01/31/2019 1159   AST 24 11/25/2016 1147   ALT 14 01/31/2019 1159   ALT 30 11/25/2016 1147   BILITOT 0.5 01/31/2019 1159   BILITOT 0.61 11/25/2016 1147     Lab Results  Component Value Date   LIPASE 15 12/30/2015   Lab Results  Component Value Date   WBC 7.5 01/31/2019   HGB 13.7 01/31/2019   HCT 41.3 01/31/2019   MCV 87.7 01/31/2019   PLT 176 01/31/2019   Lab Results  Component Value Date   CHOL 226 (H) 07/24/2018   HDL 30.50 (L) 07/24/2018   LDLCALC 138 (H) 09/23/2014   LDLDIRECT 159.0 07/24/2018   TRIG 289.0 (H) 07/24/2018   CHOLHDL 7 07/24/2018   Lab Results  Component Value Date   TSH 3.98 12/14/2018   CT abd/pelv w/out contrast 12/30/2016  CLINICAL DATA:  Left upper quadrant pain for 2 days.  EXAM: CT ABDOMEN AND PELVIS WITHOUT CONTRAST  TECHNIQUE: Multidetector CT imaging of the abdomen and pelvis was performed following the standard protocol without IV contrast.  COMPARISON:  CT abdomen pelvis 05/03/2014  FINDINGS:  Lower chest: There is bilateral dependent subsegmental atelectasis.  Hepatobiliary: Normal hepatic size and contours. No perihepatic ascites. No intra- or extrahepatic biliary dilatation. Status post cholecystectomy.  Pancreas: Normal pancreatic contours. No peripancreatic fluid collection or pancreatic ductal dilatation.  Spleen: Normal.  Adrenals/Urinary Tract: Normal adrenal glands. No hydronephrosis or nephrolithiasis. No abnormal perinephric stranding. No ureteral obstruction.  Stomach/Bowel: No abnormal bowel dilatation. No bowel wall thickening or adjacent fat stranding to indicate acute inflammation. No abdominal fluid collection. Normal appendix.  Vascular/Lymphatic: There is atherosclerotic calcification of the non aneurysmal  abdominal aorta. No abdominal or pelvic adenopathy.  Reproductive: Status post hysterectomy. No adnexal lesion. No free fluid in the pelvis.  Musculoskeletal: There is posterior lumbar interbody fusion extending from L2-L5 with bilateral transpedicular screws at L2-L4. There is posterior decompression at these levels. Normal visualized extrathoracic and extraperitoneal soft tissues.  Other: No contributory non-categorized findings.  IMPRESSION: 1. No acute abnormality of the abdomen or pelvis. 2. Aortic atherosclerosis.  IMPRESSION AND PLAN:  1) Persistent abd pain, progressive, with palpable abd mass. Will obtain CT abd/pelv with contrast. No new labs today. No new meds for this today.  2) Fibromyalgia: acute flare.   She has already been taking meloxicam daily as well as oxycodone (from her pain mgmt MD) q4h and tramadol hs. She says prednisone has helped in the past. Trial of prednisone taper: 51m qd x 3d, then 187mqd x 3d, then 5 mg qd x 3d. She has failed many neuropathic pain treatments for fibromyalgia (cymbalta, neurontin, amitriptyline, lyrica) and we discussed a trial of keppra today and she was agreeable.  Instructions: Take one leviteracetam tab every night for 7d, then take one every morning and one every night until I see you again for follow up in 2 weeks.  An After Visit Summary was printed and given to the patient.  FOLLOW UP: Return in about 2 weeks (around 03/08/2019) for in-office f/u abd pain and fibromyalgia.  Signed:  PhCrissie SicklesMD           02/22/2019

## 2019-02-23 ENCOUNTER — Other Ambulatory Visit (HOSPITAL_BASED_OUTPATIENT_CLINIC_OR_DEPARTMENT_OTHER): Payer: Self-pay | Admitting: Physician Assistant

## 2019-02-26 DIAGNOSIS — K5900 Constipation, unspecified: Secondary | ICD-10-CM | POA: Diagnosis not present

## 2019-02-26 DIAGNOSIS — M961 Postlaminectomy syndrome, not elsewhere classified: Secondary | ICD-10-CM | POA: Diagnosis not present

## 2019-02-26 DIAGNOSIS — M62830 Muscle spasm of back: Secondary | ICD-10-CM | POA: Diagnosis not present

## 2019-02-26 DIAGNOSIS — G894 Chronic pain syndrome: Secondary | ICD-10-CM | POA: Diagnosis not present

## 2019-02-27 ENCOUNTER — Ambulatory Visit (HOSPITAL_BASED_OUTPATIENT_CLINIC_OR_DEPARTMENT_OTHER)
Admission: RE | Admit: 2019-02-27 | Discharge: 2019-02-27 | Disposition: A | Discharge status: Home / Self Care | Payer: Medicare Other | Source: Ambulatory Visit | Attending: Family Medicine | Admitting: Family Medicine

## 2019-02-27 ENCOUNTER — Other Ambulatory Visit: Payer: Self-pay

## 2019-02-27 DIAGNOSIS — R1033 Periumbilical pain: Secondary | ICD-10-CM | POA: Diagnosis not present

## 2019-02-27 DIAGNOSIS — R1011 Right upper quadrant pain: Secondary | ICD-10-CM | POA: Insufficient documentation

## 2019-02-27 DIAGNOSIS — R1905 Periumbilic swelling, mass or lump: Secondary | ICD-10-CM | POA: Diagnosis not present

## 2019-03-01 ENCOUNTER — Telehealth: Payer: Self-pay

## 2019-03-01 NOTE — Telephone Encounter (Signed)
My Chart message sent

## 2019-03-02 ENCOUNTER — Ambulatory Visit: Payer: Medicare Other

## 2019-03-07 DIAGNOSIS — M7581 Other shoulder lesions, right shoulder: Secondary | ICD-10-CM | POA: Diagnosis not present

## 2019-03-07 DIAGNOSIS — M7541 Impingement syndrome of right shoulder: Secondary | ICD-10-CM | POA: Diagnosis not present

## 2019-03-08 ENCOUNTER — Ambulatory Visit (INDEPENDENT_AMBULATORY_CARE_PROVIDER_SITE_OTHER): Payer: Medicare Other | Admitting: Family Medicine

## 2019-03-08 ENCOUNTER — Other Ambulatory Visit: Payer: Self-pay

## 2019-03-08 ENCOUNTER — Encounter: Payer: Self-pay | Admitting: Family Medicine

## 2019-03-08 VITALS — BP 139/73 | HR 71 | Temp 98.0°F | Resp 16 | Ht 66.0 in | Wt 186.6 lb

## 2019-03-08 DIAGNOSIS — M797 Fibromyalgia: Secondary | ICD-10-CM

## 2019-03-08 DIAGNOSIS — K5909 Other constipation: Secondary | ICD-10-CM | POA: Diagnosis not present

## 2019-03-08 DIAGNOSIS — R109 Unspecified abdominal pain: Secondary | ICD-10-CM | POA: Diagnosis not present

## 2019-03-08 NOTE — Progress Notes (Signed)
OFFICE VISIT  03/08/2019   CC:  Chief Complaint  Patient presents with  . Follow-up    abd pain, fibromyalgia   HPI:    Patient is a 73 y.o. Caucasian female who presents for 2 week f/u recurrent abd pain and fibromyalgia.  Fibro flare last visit: prednisone taper rx'd.  Also started trial of keppra for long term fibromyalgia treatment.  Her fibro pain has improved significantly so she thinks the prednisone helped.  She had a subacromial steroid injection in R shoulder by Dr. Lynann Bologna yesterday. She started the keppra and feels no side effects at this time.  Abd pain: CT w/out contrast 02/27/19 showed no acute process and no evidence of hernia or mass at the site of palpable abnormality.  It did show possible constipation.  Miralax increased, enema regimen recommended.  She did as I instructed and had large evacuation of mostly watery stool on two occasions.  She noted mucous in it and it had a "different odor" to it.  No blood/melena.  Still no change in R sided abd pains.  No n/v or fevers.  Dr. Loletha Carrow is her GI MD. Last colonoscopy was 2007.  Her most recent colonoscopy was scheduled but had to be cancelled due to covid 19 crisis.   Past Medical History:  Diagnosis Date  . Allergic rhinitis   . Anxiety   . Chest pain    cr  . Chronic gastritis    dx'd by EGD 2018 (erosive, likely NSAID-induced).  . Chronic pain syndrome    right knee osteo, hx of TKA in R knee.  Pt was told by [name] that no further surgical options are available for her; also, chronic lower back pain.--[name].  [name] Chronic renal insufficiency, stage II (mild) 2015   vs acute fluctuations depending on hydration?---GFR from 60s up to 80s.  . Diverticulosis of colon    with hx of 'itis  . DJD (degenerative joint disease)    L spine and knees.  R knee inject 03/15/18, L knee inject 12/26/18 (by Dr. Lynann Bologna).  [name] Epigastric pain    As of 01/2017, GI (Dr. Loletha Carrow) planned EGD.  [name] FATIGUE / MALAISE  07/01/2009   Chronic  . Fibromyalgia   . GERD (gastroesophageal reflux disease)   . History of colonic polyps   . HTN (hypertension)   . Hypercholesterolemia    recommended restart of simvastatin 07/2018  . Hypothyroid 07/05/2012  . IBS (irritable bowel syndrome)   . IC (interstitial cystitis)    [name] at [place]  . Iron deficiency anemia, unspecified 06/27/2013   Has required IV iron on multiple occasions ([name]).  Hemoccults neg 12/2016.  . Lumbar back pain    Spinal stenosis, lumbar region with neurogenic claudication L5-S1 sx's--ESI's no help.  [name] to do CT myelogram as of 07/2018.  . Major depression, recurrent ([place])    vs dysthymia,chronic  . Memory impairment 10/03/2012   Memory impairment 10/03/2012 >referral to neuro    . Monoclonal gammopathy of undetermined significance    IgM kappa MGUS ([name]) routine f/u has shown stability of this problem.  . OSA (obstructive sleep apnea)    Latest sleep study 01/2017.  Some adjustments to CPAP being made+ dental referral for consideration of oral appliance --per pulm MD.  . Lebron Quam 04/2014; 03/2018   2015: T score -2.0 FRAX 11%/1.8%.  2019 FRAX 10%/1.5%.  . Otitis externa, candida 02/3015   [name] tx'd her with clotrimazole 1%  external solution x 1 mo  . Palpitations   . Psoriasis   . Trochanteric bursitis of both hips    + injections by ortho in the past  . Vitamin D deficiency   . Xerostomia    and xerophthalmia--per [name] (he suggests SSA, SSB, ESR, rh factor, ANA, and antimicrosmal and antithyroglobulin ab's as of 02/17/16--all these labs were NORMAL.    Past Surgical History:  Procedure Laterality Date  . ABDOMINAL HYSTERECTOMY     for prolapse with abdominal incision  . BACK SURGERY  01/23/2015   Lumbar decompression with L2-L5 fusion ([name])  . CARDIAC CATHETERIZATION     no PCI  . CHOLECYSTECTOMY    . COLONOSCOPY  04/2006   Diverticulosis, o/w normal.  . DEXA  04/06/2018   T  score -1.5 femur neck, -2.2 radius.  FRAX 10%/1.5%.  Repeat DEXA 2 yrs.  . ESOPHAGOGASTRODUODENOSCOPY  05/29/2009; 03/01/17   Esoph normal.  Gastritis (H pylori NEG) + gastric polyps (benign).  Duodenal biopsy NORMAL.  2018-mild chronic gastritis, H pylori NEG.  . EYE SURGERY Left    "lazy eye"  . HAND SURGERY Right    TRIGGER FINGER; index finger  . HERNIA REPAIR     umbilical  . KNEE ARTHROSCOPY     RIGHT KNEE X 5  . LAMINOTOMY  2010   L-4, L-5 FUSION  . NASAL SINUS SURGERY     polypectomy (remote past)  . OOPHORECTOMY  2010   Laparoscopic BSO (path benign)  . PATELLA FRACTURE SURGERY  10-08   [name]  . POSTERIOR LAMINECTOMY / DECOMPRESSION LUMBAR SPINE  9-09   [name]  . TOTAL KNEE ARTHROPLASTY  12-07 and 4-07   [name], revision 12-09 [name] at [facility]    Outpatient Medications Prior to Visit  Medication Sig Dispense Refill  . acetaminophen (TYLENOL) 325 MG tablet Take 650 mg by mouth every 6 (six) hours as needed.    . ALPRAZolam (XANAX) 1 MG tablet 1-2 tabs po bid prn 180 tablet 1  . Azelastine HCl (ASTEPRO) 0.15 % SOLN 2 sprays each nostril every 12 hours 30 mL 11  . calcium carbonate (OSCAL) 1500 (600 Ca) MG TABS tablet Take by mouth daily.    . Cholecalciferol (VITAMIN D3) 5000 units CAPS Take 4 capsules by mouth daily.     [name] doxylamine, Sleep, (SLEEP AID) 25 MG tablet Take 25 mg by mouth at bedtime as needed.    . fluticasone (CUTIVATE) 0.05 % cream Apply topically 2 (two) times daily. 30 g 1  . hydrocortisone 2.5 % cream Apply 1 application topically as needed. RECTAL ITCHING    . levETIRAcetam (KEPPRA) 500 MG tablet Take 1 tablet (500 mg total) by mouth 2 (two) times daily. 60 tablet 0  . levothyroxine (SYNTHROID, LEVOTHROID) 75 MCG tablet TAKE 1/2 (ONE-HALF) TABLET BY MOUTH ONCE DAILY 45 tablet 3  . meclizine (ANTIVERT) 25 MG tablet Take 1 tablet (25 mg total) by mouth every 6 (six) hours as needed for dizziness. 360 tablet 3  . meloxicam (MOBIC) 7.5 MG tablet TAKE  2 TABLETS (15 MG TOTAL) BY MOUTH DAILY. 180 tablet 1  . metoprolol tartrate (LOPRESSOR) 25 MG tablet Take 1 tablet by mouth once daily 90 tablet 0  . Multiple Vitamins-Iron (MULTIVITAMIN/IRON PO) Take 1 tablet by mouth daily.     [name] NIACIN PO Take by mouth daily.     [name] omeprazole (PRILOSEC) 40 MG capsule Take 1 capsule (40 mg total) by mouth  daily. 30 capsule 3  . Oxycodone HCl 10 MG TABS 10 mg every 4 (four) hours as needed. 1 every 6 hours for pain    . predniSONE (DELTASONE) 5 MG tablet 4 tabs po qd x 3d, then 2 tabs po qd x 3d, then 1 tab po qd x 3d 21 tablet 0  . Probiotic Product (ALIGN) 4 MG CAPS Take 1 capsule (4 mg total) by mouth daily. 60 capsule 0  . promethazine (PHENERGAN) 25 MG tablet TAKE ONE TABLET BY MOUTH EVERY 6 HOURS AS NEEDED FOR NAUSEA AND VOMITING 90 tablet 3  . simvastatin (ZOCOR) 20 MG tablet Take 1 tablet (20 mg total) by mouth at bedtime. 90 tablet 0  . Specialty Vitamins Products (MAGNESIUM, AMINO ACID CHELATE,) 133 MG tablet Take 1 tablet by mouth daily.    . traZODone (DESYREL) 50 MG tablet Take 1 tablet by mouth at bedtime.  2   No facility-administered medications prior to visit.     Allergies  Allergen Reactions  . Cymbalta [Duloxetine Hcl]     Facial edema  . Iohexol      Code: HIVES, Desc: pt states her face and throat swells when administered IV contrast - KB, Onset Date: [id number]   . Mepergan [Meperidine-Promethazine]     Cardiac arrest  . Morphine Rash  . Augmentin [Amoxicillin-Pot Clavulanate] Nausea Only  . Citalopram Other (See Comments)    Jittery/heart racing   . Hctz [Hydrochlorothiazide] Other (See Comments)    Too much urinating, felt dried out, etc--NO ALLERGY  . Lexapro [Escitalopram Oxalate] Other (See Comments)    Jittery,heart racing  . Lyrica [Pregabalin] Swelling    Face, hands, fingers, ankles  . Valsartan     REACTION: pt states INTOL to Diovan    ROS As per HPI  PE: Blood pressure 139/73, pulse 71, temperature 98 F  (36.7 C), temperature source Temporal, resp. rate 16, height '5\' 6"'$  (1.676 m), weight 186 lb 9.6 oz (84.6 kg), SpO2 95 %. Gen: Alert, well appearing.  Patient is oriented to person, place, time, and situation. AFFECT: pleasant, lucid thought and speech. No further exam today.  LABS:   None today    Chemistry      Component Value Date/Time   NA 138 01/31/2019 1159   NA 138 03/25/2017 1333   NA 138 11/25/2016 1147   K 4.5 01/31/2019 1159   K 4.3 03/25/2017 1333   K 3.8 11/25/2016 1147   CL 102 01/31/2019 1159   CL 104 03/25/2017 1333   CL 103 01/15/2010 0835   CO2 30 01/31/2019 1159   CO2 24 03/25/2017 1333   CO2 25 11/25/2016 1147   BUN 25 (H) 01/31/2019 1159   BUN 12 03/25/2017 1333   BUN 15.3 11/25/2016 1147   CREATININE 0.96 01/31/2019 1159   CREATININE 0.76 03/25/2017 1333   CREATININE 0.8 11/25/2016 1147      Component Value Date/Time   CALCIUM 9.9 01/31/2019 1159   CALCIUM 9.5 03/25/2017 1333   CALCIUM 9.2 11/25/2016 1147   ALKPHOS 82 01/31/2019 1159   ALKPHOS 111 03/25/2017 1333   ALKPHOS 99 11/25/2016 1147   AST 13 (L) 01/31/2019 1159   AST 24 11/25/2016 1147   ALT 14 01/31/2019 1159   ALT 30 11/25/2016 1147   BILITOT 0.5 01/31/2019 1159   BILITOT 0.61 11/25/2016 1147     Lab Results  Component Value Date   WBC 7.5 01/31/2019   HGB 13.7 01/31/2019   HCT 41.3 01/31/2019  MCV 87.7 01/31/2019   PLT 176 01/31/2019     IMPRESSION AND PLAN:  1) Recurrent abd pain, unknown etiology.  Discussed recent CT abd/pelv results. She does have signif constipation but I don't think this is the cause. She was to have a colonoscopy when covid crisis hit-->cancelled-->She'll call GI to reschedule her colonoscopy.  2) Constipation, at least partially opioid-induced. Resume her normal constipation treatment regimen.  3) Fibromyalgia: significant imrovement since last visit 2 wks ago. Not sure if this improvement can be attributed to prednisone taper we tried or  not. Continue keppra at 500 mg bid dosing that we just recently started 2 wks ago.  An After Visit Summary was printed and given to the patient.  FOLLOW UP: Return in about 4 weeks (around 04/05/2019) for f/u fibromyalgia and abd pain.  Signed:  Crissie Sickles, MD           03/08/2019

## 2019-03-13 ENCOUNTER — Other Ambulatory Visit: Payer: Self-pay | Admitting: Family Medicine

## 2019-03-15 ENCOUNTER — Other Ambulatory Visit: Payer: Self-pay

## 2019-03-15 ENCOUNTER — Telehealth: Payer: Self-pay | Admitting: Family Medicine

## 2019-03-15 MED ORDER — METOPROLOL TARTRATE 25 MG PO TABS: 25.0000 mg | ORAL_TABLET | Freq: Every day | ORAL | 0 refills | Status: DC

## 2019-03-15 MED ORDER — SIMVASTATIN 20 MG PO TABS: 20.0000 mg | ORAL_TABLET | Freq: Every day | ORAL | 0 refills | Status: DC

## 2019-03-15 NOTE — Telephone Encounter (Signed)
Needs refill on 3 meds   levothyroxine (SYNTHROID, LEVOTHROID) 75 MCG tablet  metoprolol tartrate (LOPRESSOR) 25 MG tablet [[id number]  simvastatin (ZOCOR) 20 MG tablet [[id number]]    [employer]

## 2019-03-20 ENCOUNTER — Telehealth: Payer: Self-pay

## 2019-03-20 NOTE — Telephone Encounter (Signed)
Patient called in voice mail stating she "has a medical question". I called her back and left message to call me.

## 2019-03-28 DIAGNOSIS — Z79891 Long term (current) use of opiate analgesic: Secondary | ICD-10-CM | POA: Diagnosis not present

## 2019-03-28 DIAGNOSIS — K5900 Constipation, unspecified: Secondary | ICD-10-CM | POA: Diagnosis not present

## 2019-03-28 DIAGNOSIS — G894 Chronic pain syndrome: Secondary | ICD-10-CM | POA: Diagnosis not present

## 2019-03-28 DIAGNOSIS — M62830 Muscle spasm of back: Secondary | ICD-10-CM | POA: Diagnosis not present

## 2019-03-28 DIAGNOSIS — M961 Postlaminectomy syndrome, not elsewhere classified: Secondary | ICD-10-CM | POA: Diagnosis not present

## 2019-04-06 DIAGNOSIS — R351 Nocturia: Secondary | ICD-10-CM | POA: Diagnosis not present

## 2019-04-06 DIAGNOSIS — N3941 Urge incontinence: Secondary | ICD-10-CM | POA: Diagnosis not present

## 2019-04-06 DIAGNOSIS — N3010 Interstitial cystitis (chronic) without hematuria: Secondary | ICD-10-CM | POA: Diagnosis not present

## 2019-04-06 DIAGNOSIS — R350 Frequency of micturition: Secondary | ICD-10-CM | POA: Diagnosis not present

## 2019-04-25 DIAGNOSIS — F329 Major depressive disorder, single episode, unspecified: Secondary | ICD-10-CM | POA: Diagnosis not present

## 2019-04-25 DIAGNOSIS — G894 Chronic pain syndrome: Secondary | ICD-10-CM | POA: Diagnosis not present

## 2019-04-25 DIAGNOSIS — K5900 Constipation, unspecified: Secondary | ICD-10-CM | POA: Diagnosis not present

## 2019-04-25 DIAGNOSIS — M961 Postlaminectomy syndrome, not elsewhere classified: Secondary | ICD-10-CM | POA: Diagnosis not present

## 2019-05-11 DIAGNOSIS — N3941 Urge incontinence: Secondary | ICD-10-CM | POA: Diagnosis not present

## 2019-05-11 DIAGNOSIS — N3946 Mixed incontinence: Secondary | ICD-10-CM | POA: Diagnosis not present

## 2019-05-11 DIAGNOSIS — R351 Nocturia: Secondary | ICD-10-CM | POA: Diagnosis not present

## 2019-05-24 DIAGNOSIS — K5900 Constipation, unspecified: Secondary | ICD-10-CM | POA: Diagnosis not present

## 2019-05-24 DIAGNOSIS — M961 Postlaminectomy syndrome, not elsewhere classified: Secondary | ICD-10-CM | POA: Diagnosis not present

## 2019-05-24 DIAGNOSIS — F329 Major depressive disorder, single episode, unspecified: Secondary | ICD-10-CM | POA: Diagnosis not present

## 2019-05-24 DIAGNOSIS — G894 Chronic pain syndrome: Secondary | ICD-10-CM | POA: Diagnosis not present

## 2019-05-31 ENCOUNTER — Other Ambulatory Visit (HOSPITAL_BASED_OUTPATIENT_CLINIC_OR_DEPARTMENT_OTHER): Payer: Self-pay | Admitting: Gynecology

## 2019-05-31 DIAGNOSIS — Z1231 Encounter for screening mammogram for malignant neoplasm of breast: Secondary | ICD-10-CM

## 2019-06-05 ENCOUNTER — Encounter: Payer: Self-pay | Admitting: Family

## 2019-06-05 ENCOUNTER — Telehealth: Payer: Self-pay | Admitting: Hematology & Oncology

## 2019-06-05 ENCOUNTER — Ambulatory Visit (INDEPENDENT_AMBULATORY_CARE_PROVIDER_SITE_OTHER): Payer: Medicare Other | Admitting: Gastroenterology

## 2019-06-05 ENCOUNTER — Inpatient Hospital Stay: Payer: Medicare Other

## 2019-06-05 ENCOUNTER — Inpatient Hospital Stay: Payer: Medicare Other | Attending: Hematology & Oncology | Admitting: Family

## 2019-06-05 ENCOUNTER — Ambulatory Visit (HOSPITAL_BASED_OUTPATIENT_CLINIC_OR_DEPARTMENT_OTHER)
Admission: RE | Admit: 2019-06-05 | Discharge: 2019-06-05 | Disposition: A | Discharge status: Home / Self Care | Payer: Medicare Other | Source: Ambulatory Visit | Attending: Gynecology | Admitting: Gynecology

## 2019-06-05 ENCOUNTER — Encounter: Payer: Self-pay | Admitting: Gastroenterology

## 2019-06-05 ENCOUNTER — Other Ambulatory Visit: Payer: Self-pay

## 2019-06-05 VITALS — BP 136/81 | HR 59 | Temp 98.3°F | Resp 20 | Wt 185.1 lb

## 2019-06-05 VITALS — BP 120/80 | HR 72 | Temp 98.1°F | Ht 65.0 in | Wt 186.0 lb

## 2019-06-05 DIAGNOSIS — R1314 Dysphagia, pharyngoesophageal phase: Secondary | ICD-10-CM

## 2019-06-05 DIAGNOSIS — D500 Iron deficiency anemia secondary to blood loss (chronic): Secondary | ICD-10-CM | POA: Diagnosis not present

## 2019-06-05 DIAGNOSIS — D508 Other iron deficiency anemias: Secondary | ICD-10-CM | POA: Diagnosis not present

## 2019-06-05 DIAGNOSIS — Z1211 Encounter for screening for malignant neoplasm of colon: Secondary | ICD-10-CM | POA: Diagnosis not present

## 2019-06-05 DIAGNOSIS — K5900 Constipation, unspecified: Secondary | ICD-10-CM | POA: Diagnosis not present

## 2019-06-05 DIAGNOSIS — K5909 Other constipation: Secondary | ICD-10-CM

## 2019-06-05 DIAGNOSIS — E611 Iron deficiency: Secondary | ICD-10-CM | POA: Diagnosis not present

## 2019-06-05 DIAGNOSIS — Z1231 Encounter for screening mammogram for malignant neoplasm of breast: Secondary | ICD-10-CM | POA: Diagnosis not present

## 2019-06-05 DIAGNOSIS — D472 Monoclonal gammopathy: Secondary | ICD-10-CM | POA: Diagnosis not present

## 2019-06-05 DIAGNOSIS — R1011 Right upper quadrant pain: Secondary | ICD-10-CM | POA: Diagnosis not present

## 2019-06-05 LAB — CBC WITH DIFFERENTIAL (CANCER CENTER ONLY)
Abs Immature Granulocytes: 0.03 10*3/uL (ref 0.00–0.07)
Basophils Absolute: 0.1 10*3/uL (ref 0.0–0.1)
Basophils Relative: 1 %
Eosinophils Absolute: 0.4 10*3/uL (ref 0.0–0.5)
Eosinophils Relative: 6 %
HCT: 42.4 % (ref 36.0–46.0)
Hemoglobin: 13.9 g/dL (ref 12.0–15.0)
Immature Granulocytes: 0 %
Lymphocytes Relative: 32 %
Lymphs Abs: 2.4 10*3/uL (ref 0.7–4.0)
MCH: 28.4 pg (ref 26.0–34.0)
MCHC: 32.8 g/dL (ref 30.0–36.0)
MCV: 86.5 fL (ref 80.0–100.0)
Monocytes Absolute: 0.5 10*3/uL (ref 0.1–1.0)
Monocytes Relative: 6 %
Neutro Abs: 4 10*3/uL (ref 1.7–7.7)
Neutrophils Relative %: 55 %
Platelet Count: 225 10*3/uL (ref 150–400)
RBC: 4.9 MIL/uL (ref 3.87–5.11)
RDW: 12.4 % (ref 11.5–15.5)
WBC Count: 7.3 10*3/uL (ref 4.0–10.5)
nRBC: 0 % (ref 0.0–0.2)

## 2019-06-05 LAB — CMP (CANCER CENTER ONLY)
ALT: 18 U/L (ref 0–44)
AST: 15 U/L (ref 15–41)
Albumin: 4.2 g/dL (ref 3.5–5.0)
Alkaline Phosphatase: 76 U/L (ref 38–126)
Anion gap: 8 (ref 5–15)
BUN: 19 mg/dL (ref 8–23)
CO2: 28 mmol/L (ref 22–32)
Calcium: 9.5 mg/dL (ref 8.9–10.3)
Chloride: 103 mmol/L (ref 98–111)
Creatinine: 0.88 mg/dL (ref 0.44–1.00)
GFR, Est AFR Am: >60 mL/min (ref >60)
GFR, Estimated: >60 mL/min (ref >60)
Glucose, Bld: 110 mg/dL — ABNORMAL HIGH (ref 70–99)
Potassium: 4.5 mmol/L (ref 3.5–5.1)
Sodium: 139 mmol/L (ref 135–145)
Total Bilirubin: 0.4 mg/dL (ref 0.3–1.2)
Total Protein: 6.9 g/dL (ref 6.5–8.1)

## 2019-06-05 NOTE — Progress Notes (Signed)
Hematology and Oncology Follow Up Visit  [name] [id number] 20-Apr-1946 73 y.o. 06/05/2019   Principle Diagnosis:  IgM kappa monoclonal gammopathy of undetermined significance Intermittent iron deficiency anemia  Current Therapy:   Observation IV iron as indicated   Interim History:  [name] is here today for follow-up. She is doing fairly well but has had some fatigue.  She has an appointment with GI tomorrow and will be scheduling a colonoscopy for constipation. She takes stool softeners but still has a hard time.  She has not noted any blood loss. No bruising or petechiae.  In May, M-spike was 0.4 and IgM level 562.  No fever, chills, n/v, cough, rash, dizziness, SOB, chest pain, palpitations, abdominal pain or changes in bowel or bladder habits.  No swelling, tenderness, numbness or tingling in her extremities.  No falls or syncopal episodes.  She has maintained a good appetite and is staying well hydrated. Her husband is vegan so she has to eat what he can eat. She does not get to eat meat often. Her weight is stable.   ECOG Performance Status: 1 - Symptomatic but completely ambulatory  Medications:  Allergies as of 06/05/2019      Reactions   Cymbalta [duloxetine Hcl]    Facial edema   Iohexol     Code: HIVES, Desc: pt states her face and throat swells when administered IV contrast - KB, Onset Date: [id number]   Mepergan [meperidine-promethazine]    Cardiac arrest   Morphine Rash   Augmentin [amoxicillin-pot Clavulanate] Nausea Only   Citalopram Other (See Comments)   Jittery/heart racing   Hctz [hydrochlorothiazide] Other (See Comments)   Too much urinating, felt dried out, etc--NO ALLERGY   Lexapro [escitalopram Oxalate] Other (See Comments)   Jittery,heart racing   Lyrica [pregabalin] Swelling   Face, hands, fingers, ankles   Valsartan    REACTION: pt states INTOL to Diovan      Medication List       Accurate as of June 05, 2019  2:51 PM. If  you have any questions, ask your nurse or doctor.        acetaminophen 325 MG tablet Commonly known as: TYLENOL Take 650 mg by mouth every 6 (six) hours as needed.   Align 4 MG Caps Take 1 capsule (4 mg total) by mouth daily.   ALPRAZolam 1 MG tablet Commonly known as: XANAX 1-2 tabs po bid prn   Azelastine HCl 0.15 % Soln Commonly known as: Astepro 2 sprays each nostril every 12 hours   calcium carbonate 1500 (600 Ca) MG Tabs tablet Commonly known as: OSCAL Take by mouth daily.   fluticasone 0.05 % cream Commonly known as: Cutivate Apply topically 2 (two) times daily.   hydrocortisone 2.5 % cream Apply 1 application topically as needed. RECTAL ITCHING   levETIRAcetam 500 MG tablet Commonly known as: KEPPRA Take 1 tablet (500 mg total) by mouth 2 (two) times daily.   levothyroxine 75 MCG tablet Commonly known as: SYNTHROID TAKE 1/2 (ONE-HALF) TABLET BY MOUTH ONCE DAILY   magnesium (amino acid chelate) 133 MG tablet Take 1 tablet by mouth daily.   meclizine 25 MG tablet Commonly known as: ANTIVERT Take 1 tablet (25 mg total) by mouth every 6 (six) hours as needed for dizziness.   meloxicam 7.5 MG tablet Commonly known as: MOBIC TAKE 2 TABLETS (15 MG TOTAL) BY MOUTH DAILY.   metoprolol tartrate 25 MG tablet Commonly known as: LOPRESSOR Take 1 tablet (25 mg total) by  mouth daily.   MULTIVITAMIN/IRON PO Take 1 tablet by mouth daily.   NIACIN PO Take by mouth daily.   omeprazole 40 MG capsule Commonly known as: PRILOSEC Take 1 capsule (40 mg total) by mouth daily.   Oxycodone HCl 10 MG Tabs 10 mg every 4 (four) hours as needed. 1 every 6 hours for pain   predniSONE 5 MG tablet Commonly known as: DELTASONE 4 tabs po qd x 3d, then 2 tabs po qd x 3d, then 1 tab po qd x 3d What changed:   how much to take  how to take this  when to take this  reasons to take this  additional instructions   promethazine 25 MG tablet Commonly known as: PHENERGAN  TAKE ONE TABLET BY MOUTH EVERY 6 HOURS AS NEEDED FOR NAUSEA AND VOMITING   simvastatin 20 MG tablet Commonly known as: ZOCOR Take 1 tablet (20 mg total) by mouth at bedtime.   Sleep Aid 25 MG tablet Generic drug: doxylamine (Sleep) Take 25 mg by mouth at bedtime as needed.   traZODone 50 MG tablet Commonly known as: DESYREL Take 1 tablet by mouth at bedtime.   Vitamin D3 125 MCG (5000 UT) Caps Take 4 capsules by mouth daily.       Allergies:  Allergies  Allergen Reactions  . Cymbalta [Duloxetine Hcl]     Facial edema  . Iohexol      Code: HIVES, Desc: pt states her face and throat swells when administered IV contrast - KB, Onset Date: [id number]   . Mepergan [Meperidine-Promethazine]     Cardiac arrest  . Morphine Rash  . Augmentin [Amoxicillin-Pot Clavulanate] Nausea Only  . Citalopram Other (See Comments)    Jittery/heart racing   . Hctz [Hydrochlorothiazide] Other (See Comments)    Too much urinating, felt dried out, etc--NO ALLERGY  . Lexapro [Escitalopram Oxalate] Other (See Comments)    Jittery,heart racing  . Lyrica [Pregabalin] Swelling    Face, hands, fingers, ankles  . Valsartan     REACTION: pt states INTOL to Diovan    Past Medical History, Surgical history, Social history, and Family History were reviewed and updated.  Review of Systems: All other 10 point review of systems is negative.   Physical Exam:  weight is 185 lb 1.9 oz (84 kg). Her oral temperature is 98.3 F (36.8 C). Her blood pressure is 136/81 and her pulse is 59 (abnormal). Her respiration is 20 and oxygen saturation is 98%.   Wt Readings from Last 3 Encounters:  06/05/19 186 lb (84.4 kg)  06/05/19 185 lb 1.9 oz (84 kg)  03/08/19 186 lb 9.6 oz (84.6 kg)    Ocular: Sclerae unicteric, pupils equal, round and reactive to light Ear-nose-throat: Oropharynx clear, dentition fair Lymphatic: No cervical or supraclavicular adenopathy Lungs no rales or rhonchi, good excursion bilaterally  Heart regular rate and rhythm, no murmur appreciated Abd soft, nontender, positive bowel sounds, no liver or spleen tip palpated on exam, no fluid wave  MSK no focal spinal tenderness, no joint edema Neuro: non-focal, well-oriented, appropriate affect Breasts: Deferred   Lab Results  Component Value Date   WBC 7.3 06/05/2019   HGB 13.9 06/05/2019   HCT 42.4 06/05/2019   MCV 86.5 06/05/2019   PLT 225 06/05/2019   Lab Results  Component Value Date   FERRITIN 203 01/31/2019   IRON 93 01/31/2019   TIBC 278 01/31/2019   UIBC 184 01/31/2019   IRONPCTSAT 34 01/31/2019   Lab Results  Component  Value Date   RETICCTPCT 1.2 01/20/2011   RBC 4.90 06/05/2019   RETICCTABS 52.2 01/20/2011   Lab Results  Component Value Date   KPAFRELGTCHN 16.2 01/31/2019   LAMBDASER 17.0 01/31/2019   KAPLAMBRATIO 0.95 01/31/2019   Lab Results  Component Value Date   IGGSERUM 710 01/31/2019   IGGSERUM 730 01/31/2019   IGA 253 01/31/2019   IGA 261 01/31/2019   IGMSERUM 562 (H) 01/31/2019   IGMSERUM 584 (H) 01/31/2019   Lab Results  Component Value Date   TOTALPROTELP 7.1 01/31/2019   ALBUMINELP 4.0 01/31/2019   A1GS 0.2 01/31/2019   A2GS 0.8 01/31/2019   BETS 1.0 01/31/2019   BETA2SER 0.5 06/19/2015   GAMS 1.1 01/31/2019   MSPIKE 0.4 (H) 01/31/2019   SPEI Comment 10/06/2017     Chemistry      Component Value Date/Time   NA 139 06/05/2019 1108   NA 138 03/25/2017 1333   NA 138 11/25/2016 1147   K 4.5 06/05/2019 1108   K 4.3 03/25/2017 1333   K 3.8 11/25/2016 1147   CL 103 06/05/2019 1108   CL 104 03/25/2017 1333   CL 103 01/15/2010 0835   CO2 28 06/05/2019 1108   CO2 24 03/25/2017 1333   CO2 25 11/25/2016 1147   BUN 19 06/05/2019 1108   BUN 12 03/25/2017 1333   BUN 15.3 11/25/2016 1147   CREATININE 0.88 06/05/2019 1108   CREATININE 0.76 03/25/2017 1333   CREATININE 0.8 11/25/2016 1147      Component Value Date/Time   CALCIUM 9.5 06/05/2019 1108   CALCIUM 9.5 03/25/2017  1333   CALCIUM 9.2 11/25/2016 1147   ALKPHOS 76 06/05/2019 1108   ALKPHOS 111 03/25/2017 1333   ALKPHOS 99 11/25/2016 1147   AST 15 06/05/2019 1108   AST 24 11/25/2016 1147   ALT 18 06/05/2019 1108   ALT 30 11/25/2016 1147   BILITOT 0.4 06/05/2019 1108   BILITOT 0.61 11/25/2016 1147       Impression and Plan: Ms. Chell is a very pleasant 73 yo caucasian female with MGUS and intermittent iron deficiency.  Protein studies are pending.  We will see what her iron studies show and bring her back in for infusion if needed.  We will go ahead and plan to see her back in another 4 months.  She will contact our office with any questions or concerns. We can certainly see her sooner if needed.   Laverna Peace, NP 9/15/20202:51 PM

## 2019-06-05 NOTE — Telephone Encounter (Signed)
Called and spoke with patient regarding appointments added per 9/15 los °

## 2019-06-05 NOTE — Patient Instructions (Signed)
If you are age 72 or older, your body mass index should be between 23-30. Your Body mass index is 30.95 kg/m. If this is out of the aforementioned range listed, please consider follow up with your Primary Care Provider.  If you are age 41 or younger, your body mass index should be between 19-25. Your Body mass index is 30.95 kg/m. If this is out of the aformentioned range listed, please consider follow up with your Primary Care Provider.   It has been recommended to you by your physician that you have a(n) Colonoscopy/EGD with Golytly and Reglan completed. Per your request, we did not schedule the procedure(s) today. Please contact our office at [phone] should you decide to have the procedure completed.  It was a pleasure to see you today!  Dr. Loletha Carrow

## 2019-06-05 NOTE — Progress Notes (Signed)
[facility] Progress Note  Chief Complaint: Right upper quadrant pain  Subjective  History: Seen 2018 for episodic RUQ pain, EGD only mild erosive gastrisis (probably from NSAIDS - negative for H pylori on bx)  Pain has been occurring since at least 2009 when seen by [name] - also has fibromyalgia. Prior cholecystectomy.  Last colonoscopy with [facility] Aug 2007 - diverticulosis and no polyps.   [name] has the same intermittent right upper quadrant pain as before.  It has no clear triggers or relieving factors, and is often tender to the touch.  She has constipation for many years that she feels has been worsening over the last 6 to 12 months.  Stool is dry and small pebbles.  She has not had improvement on a capful a day of MiraLAX, and was wondering if she could increase the dose or frequency.  Denies rectal bleeding.   She has episodes of regurgitation and also sometimes solids and liquids feel "hung up" in the neck after swallowing.  ROS: Cardiovascular:  no chest pain Respiratory: no dyspnea Chronic back knee and muscle pain Anxiety Remainder of systems negative except as above The patient's Past Medical, Family and Social History were reviewed and are on file in the EMR.  Objective:  Med list reviewed  Current Outpatient Medications:  .  acetaminophen (TYLENOL) 325 MG tablet, Take 650 mg by mouth every 6 (six) hours as needed., Disp: , Rfl:  .  ALPRAZolam (XANAX) 1 MG tablet, 1-2 tabs po bid prn, Disp: 180 tablet, Rfl: 1 .  Azelastine HCl (ASTEPRO) 0.15 % SOLN, 2 sprays each nostril every 12 hours, Disp: 30 mL, Rfl: 11 .  calcium carbonate (OSCAL) 1500 (600 Ca) MG TABS tablet, Take by mouth daily., Disp: , Rfl:  .  Cholecalciferol (VITAMIN D3) 5000 units CAPS, Take 4 capsules by mouth daily. , Disp: , Rfl:  .  doxylamine, Sleep, (SLEEP AID) 25 MG tablet, Take 25 mg by mouth at bedtime as needed., Disp: , Rfl:  .  fluticasone (CUTIVATE) 0.05 % cream, Apply  topically 2 (two) times daily., Disp: 30 g, Rfl: 1 .  hydrocortisone 2.5 % cream, Apply 1 application topically as needed. RECTAL ITCHING, Disp: , Rfl:  .  levETIRAcetam (KEPPRA) 500 MG tablet, Take 1 tablet (500 mg total) by mouth 2 (two) times daily., Disp: 60 tablet, Rfl: 0 .  levothyroxine (SYNTHROID, LEVOTHROID) 75 MCG tablet, TAKE 1/2 (ONE-HALF) TABLET BY MOUTH ONCE DAILY, Disp: 45 tablet, Rfl: 3 .  meclizine (ANTIVERT) 25 MG tablet, Take 1 tablet (25 mg total) by mouth every 6 (six) hours as needed for dizziness., Disp: 360 tablet, Rfl: 3 .  meloxicam (MOBIC) 7.5 MG tablet, TAKE 2 TABLETS (15 MG TOTAL) BY MOUTH DAILY., Disp: 180 tablet, Rfl: 1 .  metoprolol tartrate (LOPRESSOR) 25 MG tablet, Take 1 tablet (25 mg total) by mouth daily., Disp: 90 tablet, Rfl: 0 .  Multiple Vitamins-Iron (MULTIVITAMIN/IRON PO), Take 1 tablet by mouth daily. , Disp: , Rfl:  .  NIACIN PO, Take by mouth daily. , Disp: , Rfl:  .  omeprazole (PRILOSEC) 40 MG capsule, Take 1 capsule (40 mg total) by mouth daily., Disp: 30 capsule, Rfl: 3 .  Oxycodone HCl 10 MG TABS, 10 mg every 4 (four) hours as needed. 1 every 6 hours for pain, Disp: , Rfl:  .  predniSONE (DELTASONE) 5 MG tablet, 4 tabs po qd x 3d, then 2 tabs po qd x 3d, then 1 tab po  qd x 3d (Patient taking differently: Take 5 mg by mouth as needed. ), Disp: 21 tablet, Rfl: 0 .  Probiotic Product (ALIGN) 4 MG CAPS, Take 1 capsule (4 mg total) by mouth daily., Disp: 60 capsule, Rfl: 0 .  promethazine (PHENERGAN) 25 MG tablet, TAKE ONE TABLET BY MOUTH EVERY 6 HOURS AS NEEDED FOR NAUSEA AND VOMITING, Disp: 90 tablet, Rfl: 3 .  simvastatin (ZOCOR) 20 MG tablet, Take 1 tablet (20 mg total) by mouth at bedtime., Disp: 90 tablet, Rfl: 0 .  Specialty Vitamins Products (MAGNESIUM, AMINO ACID CHELATE,) 133 MG tablet, Take 1 tablet by mouth daily., Disp: , Rfl:  .  traZODone (DESYREL) 50 MG tablet, Take 1 tablet by mouth at bedtime., Disp: , Rfl: 2   Vital signs in last 24  hrs: Vitals:   06/05/19 1343  BP: 120/80  Pulse: 72  Temp: 98.1 F (36.7 C)    Physical Exam  Well-appearing, pleasant and conversational.  Antalgic gait, gets on exam table with minimal assistance  HEENT: sclera anicteric, oral mucosa moist without lesions  Neck: supple, no thyromegaly, JVD or lymphadenopathy  Cardiac: RRR without murmurs, S1S2 heard, no peripheral edema  Pulm: clear to auscultation bilaterally, normal RR and effort noted  Abdomen: soft, right-sided tenderness, with active bowel sounds. No guarding or palpable hepatosplenomegaly.  Skin; warm and dry, no jaundice or rash   @ASSESSMENTPLANBEGIN @ Assessment: Encounter Diagnoses  Name Primary?  . Pharyngoesophageal dysphagia Yes  . RUQ pain   . Chronic constipation   . Special screening for malignant neoplasms, colon    Chronic stable right-sided abdominal pain in the setting of chronic pain syndrome, previous extensive work-up unrevealing. Dysphagia, difficult to tell if mechanical or motility.  Chronic constipation, suspected to be at least partially related to chronic opioids and perhaps diverticulosis as well.   Plan: Screening colonoscopy Upper endoscopy  He was agreeable after discussion of procedure and risks.  The benefits and risks of the planned procedure were described in detail with the patient or (when appropriate) their health care proxy.  Risks were outlined as including, but not limited to, bleeding, infection, perforation, adverse medication reaction leading to cardiac or pulmonary decompensation.  The limitation of incomplete mucosal visualization was also discussed.  No guarantees or warranties were given.    Total time 25 minutes, over half spent face-to-face with patient in counseling and coordination of care.   Nelida Meuse III

## 2019-06-06 ENCOUNTER — Other Ambulatory Visit: Payer: Self-pay

## 2019-06-06 DIAGNOSIS — R1011 Right upper quadrant pain: Secondary | ICD-10-CM

## 2019-06-06 DIAGNOSIS — Z1211 Encounter for screening for malignant neoplasm of colon: Secondary | ICD-10-CM

## 2019-06-06 DIAGNOSIS — R1314 Dysphagia, pharyngoesophageal phase: Secondary | ICD-10-CM

## 2019-06-06 LAB — IRON AND TIBC
Iron: 67 ug/dL (ref 41–142)
Saturation Ratios: 23 % (ref 21–57)
TIBC: 284 ug/dL (ref 236–444)
UIBC: 217 ug/dL (ref 120–384)

## 2019-06-06 LAB — IGG, IGA, IGM
IgA: 259 mg/dL (ref 64–422)
IgG (Immunoglobin G), Serum: 694 mg/dL (ref 586–1602)
IgM (Immunoglobulin M), Srm: 620 mg/dL — ABNORMAL HIGH (ref 26–217)

## 2019-06-06 LAB — FERRITIN: Ferritin: 173 ng/mL (ref 11–307)

## 2019-06-06 LAB — KAPPA/LAMBDA LIGHT CHAINS
Kappa free light chain: 21.4 mg/L — ABNORMAL HIGH (ref 3.3–19.4)
Kappa, lambda light chain ratio: 1.23 (ref 0.26–1.65)
Lambda free light chains: 17.4 mg/L (ref 5.7–26.3)

## 2019-06-06 MED ORDER — METOCLOPRAMIDE HCL 5 MG PO TABS: ORAL_TABLET | ORAL | 0 refills | Status: DC

## 2019-06-06 MED ORDER — PEG 3350-KCL-NA BICARB-NACL 420 G PO SOLR: 4000.0000 mL | Freq: Once | ORAL | 0 refills | Status: AC

## 2019-06-07 LAB — PROTEIN ELECTROPHORESIS, SERUM, WITH REFLEX
A/G Ratio: 1.4 (ref 0.7–1.7)
Albumin ELP: 3.8 g/dL (ref 2.9–4.4)
Alpha-1-Globulin: 0.2 g/dL (ref 0.0–0.4)
Alpha-2-Globulin: 0.9 g/dL (ref 0.4–1.0)
Beta Globulin: 0.8 g/dL (ref 0.7–1.3)
Gamma Globulin: 1 g/dL (ref 0.4–1.8)
Globulin, Total: 2.8 g/dL (ref 2.2–3.9)
M-Spike, %: 0.3 g/dL — ABNORMAL HIGH
SPEP Interpretation: 0
Total Protein ELP: 6.6 g/dL (ref 6.0–8.5)

## 2019-06-07 LAB — IMMUNOFIXATION REFLEX, SERUM
IgA: 271 mg/dL (ref 64–422)
IgG (Immunoglobin G), Serum: 734 mg/dL (ref 586–1602)
IgM (Immunoglobulin M), Srm: 636 mg/dL — ABNORMAL HIGH (ref 26–217)

## 2019-06-08 ENCOUNTER — Telehealth: Payer: Self-pay | Admitting: Gastroenterology

## 2019-06-08 ENCOUNTER — Telehealth: Payer: Self-pay | Admitting: *Deleted

## 2019-06-08 NOTE — Telephone Encounter (Signed)
-----   Message from [name], MD sent at 06/07/2019  5:40 PM EDT ----- Call and tell her that the abnormal protein that we are following is still very low.  The level is 0.3.  Before we had a level of 0.4.  Thanks so much!!

## 2019-06-08 NOTE — Telephone Encounter (Signed)
Patient notified per order of [name] that "the abnormal protein that we are following is still very low.  The level is 0.3.  Before we had a level of 0.4.  Thanks so much!!"  Pt appreciative of call and has no questions or concerns at this time.

## 2019-06-08 NOTE — Telephone Encounter (Signed)
DONE

## 2019-06-11 ENCOUNTER — Ambulatory Visit: Payer: Medicare Other | Admitting: Family Medicine

## 2019-06-15 ENCOUNTER — Telehealth: Payer: Self-pay | Admitting: Family Medicine

## 2019-06-15 NOTE — Telephone Encounter (Signed)
Patient refill request - patient reports rain and cold weather is really bad with her fibromyalgia   predniSONE (DELTASONE) 5 MG tablet [id number]  [facility] [address]   (just for this prescription only - because it is closer to her house)

## 2019-06-15 NOTE — Telephone Encounter (Signed)
RF request for Prednisone LOV: 03/08/19 Next ov: advised to f/u 4 weeks Last written: 02/22/19 (21,0)  This was a trial med started during her last OV. Please fill, if appropriate.

## 2019-06-17 NOTE — Telephone Encounter (Signed)
Needs ov

## 2019-06-18 NOTE — Telephone Encounter (Signed)
Patient scheduled 06/22/2019 at 11am.

## 2019-06-21 DIAGNOSIS — F329 Major depressive disorder, single episode, unspecified: Secondary | ICD-10-CM | POA: Diagnosis not present

## 2019-06-21 DIAGNOSIS — M961 Postlaminectomy syndrome, not elsewhere classified: Secondary | ICD-10-CM | POA: Diagnosis not present

## 2019-06-21 DIAGNOSIS — K5900 Constipation, unspecified: Secondary | ICD-10-CM | POA: Diagnosis not present

## 2019-06-21 DIAGNOSIS — G894 Chronic pain syndrome: Secondary | ICD-10-CM | POA: Diagnosis not present

## 2019-06-22 ENCOUNTER — Ambulatory Visit: Payer: Medicare Other | Admitting: Family Medicine

## 2019-06-23 ENCOUNTER — Encounter: Payer: Self-pay | Admitting: Family Medicine

## 2019-06-27 ENCOUNTER — Encounter: Payer: Self-pay | Admitting: Gynecology

## 2019-06-28 ENCOUNTER — Encounter: Payer: Self-pay | Admitting: Family Medicine

## 2019-06-28 ENCOUNTER — Other Ambulatory Visit: Payer: Self-pay

## 2019-06-28 ENCOUNTER — Ambulatory Visit (INDEPENDENT_AMBULATORY_CARE_PROVIDER_SITE_OTHER): Payer: Medicare Other | Admitting: Family Medicine

## 2019-06-28 VITALS — BP 107/76 | HR 66 | Temp 97.6°F | Wt 180.0 lb

## 2019-06-28 DIAGNOSIS — M797 Fibromyalgia: Secondary | ICD-10-CM | POA: Diagnosis not present

## 2019-06-28 NOTE — Progress Notes (Signed)
Virtual Visit via Video Note  I connected with pt on 06/28/19 at  2:00 PM EDT by telephone (a video enabled telemedicine application was not an option for her) and verified that I am speaking with the correct person using two identifiers.  Location patient: home Location provider:work or home office Persons participating in the virtual visit: patient, provider  I discussed the limitations of evaluation and management by telemedicine and the availability of in person appointments. The patient expressed understanding and agreed to proceed.  Telemedicine visit is a necessity given the COVID-19 restrictions in place at the current time.  HPI: 74 y/o WF with whom I am doing a telephone visit today (due to COVID-19 pandemic restrictions) f/u fibromyalgia. A/P for this problem as of last f/u on 03/08/19 was: "Fibromyalgia: significant imrovement since last visit 2 wks ago. Not sure if this improvement can be attributed to prednisone taper we tried or not (pt thinks so). Continue keppra at 500 mg bid dosing that we just recently started 2 wks ago.  Interim hx: "It got better", but the cold weather still makes her whole body hurt.   She was flaring recently, says she thought she may need another round of steroids.  She ended up seeing her pain mgmt MD and her pain MD reluctantly rx'd prednisone (8 pill taper) but pt has not taken them.  She stopped keppra after about 1 mo and didn't RF it, says it didn't help and it also made her face feel like it was hot.    She has plans with GI to do EGD and colonoscopy on 07/16/19.  No new questions.  ROS: See pertinent positives and negatives per HPI.  Past Medical History:  Diagnosis Date  . Allergic rhinitis   . Anxiety   . Chest pain    cr  . Chronic gastritis    dx'd by EGD 2018 (erosive, likely NSAID-induced).  . Chronic pain syndrome    right knee osteo, hx of TKA in R knee.  Pt was told by [name] that no further surgical options are  available for her; also, chronic lower back pain.--[name].  [name] Chronic renal insufficiency, stage II (mild) 2015   vs acute fluctuations depending on hydration?---GFR from 60s up to 80s.  . Diverticulosis of colon    with hx of 'itis  . DJD (degenerative joint disease)    L spine and knees.  R knee inject 03/15/18, L knee inject 12/26/18 (by Dr. Lynann Bologna).  [name] Epigastric pain    As of 01/2017, GI (Dr. Loletha Carrow) planned EGD.  [name] FATIGUE / MALAISE 07/01/2009   Chronic  . Fibromyalgia   . GERD (gastroesophageal reflux disease)   . History of colonic polyps   . HTN (hypertension)   . Hypercholesterolemia    recommended restart of simvastatin 07/2018  . Hypothyroid 07/05/2012  . IBS (irritable bowel syndrome)   . IC (interstitial cystitis)    [name] at [place]  . Iron deficiency anemia, unspecified 06/27/2013   Has required IV iron on multiple occasions ([name]).  Hemoccults neg 12/2016.  . Lumbar back pain    Spinal stenosis, lumbar region with neurogenic claudication L5-S1 sx's--ESI's no help.  [name] to do CT myelogram as of 07/2018.  . Major depression, recurrent ([place])    vs dysthymia,chronic  . Memory impairment 10/03/2012   Memory impairment 10/03/2012 >referral to neuro    . Monoclonal gammopathy of undetermined significance    IgM kappa MGUS ([name])  routine f/u has shown stability of this problem (most recent f/u 05/2019)  . OSA (obstructive sleep apnea)    Latest sleep study 01/2017.  Some adjustments to CPAP being made+ dental referral for consideration of oral appliance --per pulm MD.  . Lebron Quam 04/2014; 03/2018   2015: T score -2.0 FRAX 11%/1.8%.  2019 FRAX 10%/1.5%.  . Otitis externa, candida 06/9322   [name] tx'd her with clotrimazole 1% external solution x 1 mo  . Palpitations   . Psoriasis   . Trochanteric bursitis of both hips    + injections by ortho in the past  . Vitamin D deficiency   . Xerostomia    and xerophthalmia--per Dr.  Erik Obey (he suggests SSA, SSB, ESR, rh factor, ANA, and antimicrosmal and antithyroglobulin ab's as of 02/17/16--all these labs were NORMAL.    Past Surgical History:  Procedure Laterality Date  . ABDOMINAL HYSTERECTOMY     for prolapse with abdominal incision  . BACK SURGERY  01/23/2015   Lumbar decompression with L2-L5 fusion ([name])  . CARDIAC CATHETERIZATION     no PCI  . CHOLECYSTECTOMY    . COLONOSCOPY  04/2006   Diverticulosis, o/w normal.  . DEXA  04/06/2018   T score -1.5 femur neck, -2.2 radius.  FRAX 10%/1.5%.  Repeat DEXA 2 yrs.  . ESOPHAGOGASTRODUODENOSCOPY  05/29/2009; 03/01/17   Esoph normal.  Gastritis (H pylori NEG) + gastric polyps (benign).  Duodenal biopsy NORMAL.  2018-mild chronic gastritis, H pylori NEG.  . EYE SURGERY Left    "lazy eye"  . HAND SURGERY Right    TRIGGER FINGER; index finger  . HERNIA REPAIR     umbilical  . KNEE ARTHROSCOPY     RIGHT KNEE X 5  . LAMINOTOMY  2010   L-4, L-5 FUSION  . NASAL SINUS SURGERY     polypectomy (remote past)  . OOPHORECTOMY  2010   Laparoscopic BSO (path benign)  . PATELLA FRACTURE SURGERY  10-08   [name]  . POSTERIOR LAMINECTOMY / DECOMPRESSION LUMBAR SPINE  9-09   [name]  . TOTAL KNEE ARTHROPLASTY  12-07 and 4-07   [name], revision 12-09 [name] at [facility]    Family History  Problem Relation Age of Onset  . Leukemia Brother   . Lung disease Brother   . Thyroid disease Brother   . Melanoma Brother   . Hypertension Mother   . Thyroid disease Mother   . Hypertension Father   . Breast cancer Sister        Age 42's  . Hypertension Sister   . Coronary artery disease Sister   . Lupus Sister   . Thyroid disease Sister   . Stomach cancer Brother   . Cancer Other        "all over"  . Parkinson's disease Sister       Current Outpatient Medications:  .  acetaminophen (TYLENOL) 325 MG tablet, Take 650 mg by mouth every 6 (six) hours as needed., Disp: , Rfl:  .  ALPRAZolam (XANAX) 1 MG tablet, 1-2  tabs po bid prn, Disp: 180 tablet, Rfl: 1 .  Azelastine HCl (ASTEPRO) 0.15 % SOLN, 2 sprays each nostril every 12 hours, Disp: 30 mL, Rfl: 11 .  calcium carbonate (OSCAL) 1500 (600 Ca) MG TABS tablet, Take by mouth daily., Disp: , Rfl:  .  Cholecalciferol (VITAMIN D3) 5000 units CAPS, Take 4 capsules by mouth daily. , Disp: , Rfl:  .  doxylamine, Sleep, (SLEEP AID)  25 MG tablet, Take 25 mg by mouth at bedtime as needed., Disp: , Rfl:  .  fluticasone (CUTIVATE) 0.05 % cream, Apply topically 2 (two) times daily., Disp: 30 g, Rfl: 1 .  hydrocortisone 2.5 % cream, Apply 1 application topically as needed. RECTAL ITCHING, Disp: , Rfl:  .  levETIRAcetam (KEPPRA) 500 MG tablet, Take 1 tablet (500 mg total) by mouth 2 (two) times daily., Disp: 60 tablet, Rfl: 0 .  levothyroxine (SYNTHROID, LEVOTHROID) 75 MCG tablet, TAKE 1/2 (ONE-HALF) TABLET BY MOUTH ONCE DAILY, Disp: 45 tablet, Rfl: 3 .  meclizine (ANTIVERT) 25 MG tablet, Take 1 tablet (25 mg total) by mouth every 6 (six) hours as needed for dizziness., Disp: 360 tablet, Rfl: 3 .  meloxicam (MOBIC) 7.5 MG tablet, TAKE 2 TABLETS (15 MG TOTAL) BY MOUTH DAILY., Disp: 180 tablet, Rfl: 1 .  metoCLOPramide (REGLAN) 5 MG tablet, Take 1 tablet 1 hour prior to drinking the bowel prep., Disp: 2 tablet, Rfl: 0 .  metoprolol tartrate (LOPRESSOR) 25 MG tablet, Take 1 tablet (25 mg total) by mouth daily., Disp: 90 tablet, Rfl: 0 .  Multiple Vitamins-Iron (MULTIVITAMIN/IRON PO), Take 1 tablet by mouth daily. , Disp: , Rfl:  .  NIACIN PO, Take by mouth daily. , Disp: , Rfl:  .  omeprazole (PRILOSEC) 40 MG capsule, Take 1 capsule (40 mg total) by mouth daily., Disp: 30 capsule, Rfl: 3 .  Oxycodone HCl 10 MG TABS, 10 mg every 4 (four) hours as needed. 1 every 6 hours for pain, Disp: , Rfl:  .  PEG 3350-KCl-NaBcb-NaCl-NaSulf (PEG-3350/ELECTROLYTES) 236 g SOLR, See admin instructions., Disp: , Rfl:  .  Probiotic Product (ALIGN) 4 MG CAPS, Take 1 capsule (4 mg total) by mouth  daily., Disp: 60 capsule, Rfl: 0 .  promethazine (PHENERGAN) 25 MG tablet, TAKE ONE TABLET BY MOUTH EVERY 6 HOURS AS NEEDED FOR NAUSEA AND VOMITING, Disp: 90 tablet, Rfl: 3 .  simvastatin (ZOCOR) 20 MG tablet, Take 1 tablet (20 mg total) by mouth at bedtime., Disp: 90 tablet, Rfl: 0 .  Specialty Vitamins Products (MAGNESIUM, AMINO ACID CHELATE,) 133 MG tablet, Take 1 tablet by mouth daily., Disp: , Rfl:  .  traZODone (DESYREL) 50 MG tablet, Take 1 tablet by mouth at bedtime., Disp: , Rfl: 2 .  predniSONE (DELTASONE) 20 MG tablet, TAKE 1 TABLET BY MOUTH TWICE DAILY FOR 3 DAYS AND 1 ONCE DAILY FOR 2 DAYS THEN STOP, Disp: , Rfl:   EXAM:  VITALS per patient if applicable: BP 270/35 (BP Location: Left Arm, Patient Position: Sitting, Cuff Size: Normal)   Pulse 66   Temp 97.6 F (36.4 C) (Oral)   Wt 180 lb (81.6 kg)   BMI 29.95 kg/m    GENERAL: alert, oriented, sounds well and in no acute distress AFFECT: pleasant, lucid thought and speech. No further exam b/c telephone visit.  LABS: none today    Chemistry      Component Value Date/Time   NA 139 06/05/2019 1108   NA 138 03/25/2017 1333   NA 138 11/25/2016 1147   K 4.5 06/05/2019 1108   K 4.3 03/25/2017 1333   K 3.8 11/25/2016 1147   CL 103 06/05/2019 1108   CL 104 03/25/2017 1333   CL 103 01/15/2010 0835   CO2 28 06/05/2019 1108   CO2 24 03/25/2017 1333   CO2 25 11/25/2016 1147   BUN 19 06/05/2019 1108   BUN 12 03/25/2017 1333   BUN 15.3 11/25/2016 1147  CREATININE 0.88 06/05/2019 1108   CREATININE 0.76 03/25/2017 1333   CREATININE 0.8 11/25/2016 1147      Component Value Date/Time   CALCIUM 9.5 06/05/2019 1108   CALCIUM 9.5 03/25/2017 1333   CALCIUM 9.2 11/25/2016 1147   ALKPHOS 76 06/05/2019 1108   ALKPHOS 111 03/25/2017 1333   ALKPHOS 99 11/25/2016 1147   AST 15 06/05/2019 1108   AST 24 11/25/2016 1147   ALT 18 06/05/2019 1108   ALT 30 11/25/2016 1147   BILITOT 0.4 06/05/2019 1108   BILITOT 0.61 11/25/2016 1147      Lab Results  Component Value Date   WBC 7.3 06/05/2019   HGB 13.9 06/05/2019   HCT 42.4 06/05/2019   MCV 86.5 06/05/2019   PLT 225 06/05/2019   No results found for: HGBA1C  ASSESSMENT AND PLAN:  Discussed the following assessment and plan:  Fibromyalgia syndrome: fairly stable. She has failed or not tolerated EVERY medication that I know to try for this condition. Will keep brief courses of prednisone in mind as a treatment but only RARE use---we discussed risks of this med again today.  She really is not sure if prednisone has actually helped in the past, but said she thinks it did when I rx'd it last. No new meds or testing at this time. She'll continue to go to pain mgmt center.  I discussed the assessment and treatment plan with the patient. The patient was provided an opportunity to ask questions and all were answered. The patient agreed with the plan and demonstrated an understanding of the instructions.   The patient was advised to call back or seek an in-person evaluation if the symptoms worsen or if the condition fails to improve as anticipated.  Spent 15 min with pt today, with >50% of this time spent in counseling and care coordination regarding the above problems.  F/u: 6 mo RCI  Signed:  Crissie Sickles, MD           06/28/2019

## 2019-07-06 ENCOUNTER — Encounter (HOSPITAL_BASED_OUTPATIENT_CLINIC_OR_DEPARTMENT_OTHER): Payer: Self-pay

## 2019-07-06 ENCOUNTER — Other Ambulatory Visit: Payer: Self-pay

## 2019-07-06 ENCOUNTER — Emergency Department (HOSPITAL_BASED_OUTPATIENT_CLINIC_OR_DEPARTMENT_OTHER): Payer: Medicare Other

## 2019-07-06 ENCOUNTER — Emergency Department (HOSPITAL_BASED_OUTPATIENT_CLINIC_OR_DEPARTMENT_OTHER)
Admission: EM | Admit: 2019-07-06 | Discharge: 2019-07-07 | Disposition: A | Discharge status: Home / Self Care | Payer: Medicare Other | Attending: Emergency Medicine | Admitting: Emergency Medicine

## 2019-07-06 DIAGNOSIS — E039 Hypothyroidism, unspecified: Secondary | ICD-10-CM | POA: Diagnosis not present

## 2019-07-06 DIAGNOSIS — Z79899 Other long term (current) drug therapy: Secondary | ICD-10-CM | POA: Diagnosis not present

## 2019-07-06 DIAGNOSIS — R002 Palpitations: Secondary | ICD-10-CM | POA: Diagnosis not present

## 2019-07-06 DIAGNOSIS — I10 Essential (primary) hypertension: Secondary | ICD-10-CM | POA: Insufficient documentation

## 2019-07-06 DIAGNOSIS — R55 Syncope and collapse: Secondary | ICD-10-CM | POA: Diagnosis not present

## 2019-07-06 DIAGNOSIS — R270 Ataxia, unspecified: Secondary | ICD-10-CM | POA: Diagnosis not present

## 2019-07-06 LAB — CBC WITH DIFFERENTIAL/PLATELET
Abs Immature Granulocytes: 0.02 10*3/uL (ref 0.00–0.07)
Basophils Absolute: 0.1 10*3/uL (ref 0.0–0.1)
Basophils Relative: 1 %
Eosinophils Absolute: 0.3 10*3/uL (ref 0.0–0.5)
Eosinophils Relative: 3 %
HCT: 42.5 % (ref 36.0–46.0)
Hemoglobin: 13.9 g/dL (ref 12.0–15.0)
Immature Granulocytes: 0 %
Lymphocytes Relative: 20 %
Lymphs Abs: 1.6 10*3/uL (ref 0.7–4.0)
MCH: 28.3 pg (ref 26.0–34.0)
MCHC: 32.7 g/dL (ref 30.0–36.0)
MCV: 86.6 fL (ref 80.0–100.0)
Monocytes Absolute: 0.4 10*3/uL (ref 0.1–1.0)
Monocytes Relative: 5 %
Neutro Abs: 5.6 10*3/uL (ref 1.7–7.7)
Neutrophils Relative %: 71 %
Platelets: 203 10*3/uL (ref 150–400)
RBC: 4.91 MIL/uL (ref 3.87–5.11)
RDW: 12.4 % (ref 11.5–15.5)
WBC: 7.9 10*3/uL (ref 4.0–10.5)
nRBC: 0 % (ref 0.0–0.2)

## 2019-07-06 LAB — URINALYSIS, MICROSCOPIC (REFLEX)

## 2019-07-06 LAB — BASIC METABOLIC PANEL
Anion gap: 11 (ref 5–15)
BUN: 28 mg/dL — ABNORMAL HIGH (ref 8–23)
CO2: 21 mmol/L — ABNORMAL LOW (ref 22–32)
Calcium: 9.4 mg/dL (ref 8.9–10.3)
Chloride: 106 mmol/L (ref 98–111)
Creatinine, Ser: 0.81 mg/dL (ref 0.44–1.00)
GFR calc Af Amer: >60 mL/min (ref >60)
GFR calc non Af Amer: >60 mL/min (ref >60)
Glucose, Bld: 114 mg/dL — ABNORMAL HIGH (ref 70–99)
Potassium: 4 mmol/L (ref 3.5–5.1)
Sodium: 138 mmol/L (ref 135–145)

## 2019-07-06 LAB — URINALYSIS, ROUTINE W REFLEX MICROSCOPIC
Bilirubin Urine: NEGATIVE
Glucose, UA: NEGATIVE mg/dL
Ketones, ur: NEGATIVE mg/dL
Leukocytes,Ua: NEGATIVE
Nitrite: NEGATIVE
Protein, ur: NEGATIVE mg/dL
Specific Gravity, Urine: 1.025 (ref 1.005–1.030)
pH: 6 (ref 5.0–8.0)

## 2019-07-06 MED ORDER — SODIUM CHLORIDE 0.9 % IV BOLUS
500.0000 mL | Freq: Once | INTRAVENOUS | Status: AC
  Administered 2019-07-06: 500 mL via INTRAVENOUS

## 2019-07-06 NOTE — Discharge Instructions (Signed)
Make an appointment to follow-up with your primary care provider for reevaluation.  Return to the emergency room if you have any worsening symptoms.

## 2019-07-06 NOTE — ED Provider Notes (Signed)
[facility] EMERGENCY DEPARTMENT Provider Note   CSN: [id number] Arrival date & time: 07/06/19  2206     History   Chief Complaint Chief Complaint  Patient presents with  . Hypertension    HPI [name] is a 73 y.o. female.     Patient is a 73 year old female who presents with a near syncopal type event.  Around 530 this evening she was going to the car to take her husband of boat and felt a little bit lightheaded.  When she got to the boating facility, she felt like the mask was causing her to suffocate and she got really hot and a little bit diaphoretic.  She had a little bit of a dull headache.  She went home and found her blood pressure to be high at 187/84.  She felt like her heart was skipping some beats.  She did not feel at all like her heart was racing.  She took an extra one of her metoprolol.  She is feeling better now but feels washed out.  She has no numbness or weakness to her extremities.  No vision changes.  No speech deficits.  No associated chest pain or shortness of breath.  No history of similar symptoms in the past.  She has a very mild headache currently.  She denies any recent illnesses.  No cough or cold symptoms.  No fevers.  No abdominal pain.  No urinary symptoms.     Past Medical History:  Diagnosis Date  . Allergic rhinitis   . Anxiety   . Chest pain    cr  . Chronic gastritis    dx'd by EGD 2018 (erosive, likely NSAID-induced).  . Chronic pain syndrome    right knee osteo, hx of TKA in R knee.  Pt was told by [name] that no further surgical options are available for her; also, chronic lower back pain.--[name].  [name] Chronic renal insufficiency, stage II (mild) 2015   vs acute fluctuations depending on hydration?---GFR from 60s up to 80s.  . Diverticulosis of colon    with hx of 'itis  . DJD (degenerative joint disease)    L spine and knees.  R knee inject 03/15/18, L knee inject 12/26/18 (by Dr. Lynann Bologna).  [name]  Epigastric pain    As of 01/2017, GI (Dr. Loletha Carrow) planned EGD.  [name] FATIGUE / MALAISE 07/01/2009   Chronic  . Fibromyalgia   . GERD (gastroesophageal reflux disease)   . History of colonic polyps   . HTN (hypertension)   . Hypercholesterolemia    recommended restart of simvastatin 07/2018  . Hypothyroid 07/05/2012  . IBS (irritable bowel syndrome)   . IC (interstitial cystitis)    [name] at [facility]  . Iron deficiency anemia, unspecified 06/27/2013   Has required IV iron on multiple occasions ([name]).  Hemoccults neg 12/2016.  . Lumbar back pain    Spinal stenosis, lumbar region with neurogenic claudication L5-S1 sx's--ESI's no help.  [name] to do CT myelogram as of 07/2018.  . Major depression, recurrent ([place])    vs dysthymia,chronic  . Memory impairment 10/03/2012   Memory impairment 10/03/2012 >referral to neuro    . Monoclonal gammopathy of undetermined significance    IgM kappa MGUS ([name]) routine f/u has shown stability of this problem (most recent f/u 05/2019)  . OSA (obstructive sleep apnea)    Latest sleep study 01/2017.  Some adjustments to CPAP being made+ dental referral for consideration of  oral appliance --per pulm MD.  . Osteopenia 04/2014; 03/2018   2015: T score -2.0 FRAX 11%/1.8%.  2019 FRAX 10%/1.5%.  . Otitis externa, candida 94/7654   [name] tx'd her with clotrimazole 1% external solution x 1 mo  . Palpitations   . Psoriasis   . Trochanteric bursitis of both hips    + injections by ortho in the past  . Vitamin D deficiency   . Xerostomia    and xerophthalmia--per [name] (he suggests SSA, SSB, ESR, rh factor, ANA, and antimicrosmal and antithyroglobulin ab's as of 02/17/16--all these labs were NORMAL.    Patient Active Problem List   Diagnosis Date Noted  . Acute pharyngitis 03/01/2016  . Myalgia and myositis 02/24/2016  . Arthralgia 02/24/2016  . Dry mouth 02/24/2016  . Hoarseness of voice 02/17/2016  . Referred otalgia of right  ear 02/17/2016  . Throat pain in adult 02/17/2016  . MGUS (monoclonal gammopathy of unknown significance) 12/17/2015  . DDD (degenerative disc disease), lumbar 01/20/2015  . Spinal stenosis, lumbar region, with neurogenic claudication 01/20/2015  . Degenerative disc disease, lumbar 01/20/2015  . Uncontrolled hypertension 06/01/2014  . Depression 06/01/2014  . Chronic fatigue fibromyalgia syndrome 02/13/2014  . Left-sided chest wall pain 02/13/2014  . Insomnia 02/13/2014  . Iron deficiency anemia 06/27/2013  . Hypothyroid 07/05/2012  . Involuntary muscle contractions 07/04/2012  . Chronic pain syndrome 01/27/2011  . OBSTRUCTIVE SLEEP APNEA 08/28/2009  . CENTRAL SLEEP APNEA CONDS CLASSIFIED ELSEWHERE 08/28/2009  . GASTRITIS 08/21/2008  . PERSONAL HX COLONIC POLYPS 08/05/2008  . Vitamin D deficiency 01/13/2008  . PSORIASIS 01/13/2008  . Disorder of plasma protein metabolism 01/12/2008  . DEGENERATIVE JOINT DISEASE 01/12/2008  . HYPERCHOLESTEROLEMIA 08/01/2007  . Essential hypertension 08/01/2007  . GERD 08/01/2007  . Fibromyalgia syndrome 08/01/2007  . PALPITATIONS, HX OF 08/01/2007    Past Surgical History:  Procedure Laterality Date  . ABDOMINAL HYSTERECTOMY     for prolapse with abdominal incision  . BACK SURGERY  01/23/2015   Lumbar decompression with L2-L5 fusion ([name])  . CARDIAC CATHETERIZATION     no PCI  . CHOLECYSTECTOMY    . COLONOSCOPY  04/2006   Diverticulosis, o/w normal.  . DEXA  04/06/2018   T score -1.5 femur neck, -2.2 radius.  FRAX 10%/1.5%.  Repeat DEXA 2 yrs.  . ESOPHAGOGASTRODUODENOSCOPY  05/29/2009; 03/01/17   Esoph normal.  Gastritis (H pylori NEG) + gastric polyps (benign).  Duodenal biopsy NORMAL.  2018-mild chronic gastritis, H pylori NEG.  . EYE SURGERY Left    "lazy eye"  . HAND SURGERY Right    TRIGGER FINGER; index finger  . HERNIA REPAIR     umbilical  . KNEE ARTHROSCOPY     RIGHT KNEE X 5  . LAMINOTOMY  2010   L-4, L-5 FUSION  .  NASAL SINUS SURGERY     polypectomy (remote past)  . OOPHORECTOMY  2010   Laparoscopic BSO (path benign)  . PATELLA FRACTURE SURGERY  10-08   [name]  . POSTERIOR LAMINECTOMY / DECOMPRESSION LUMBAR SPINE  9-09   [name]  . TOTAL KNEE ARTHROPLASTY  12-07 and 4-07   [name], revision 12-09 [name] at [facility] History    Gravida  0   Para      Term      Preterm      AB      Living        SAB  TAB      Ectopic      Multiple      Live Births               Home Medications    Prior to Admission medications   Medication Sig Start Date End Date Taking? Authorizing Provider  acetaminophen (TYLENOL) 325 MG tablet Take 650 mg by mouth every 6 (six) hours as needed.   Yes [provider]  ALPRAZolam Duanne Moron) 1 MG tablet 1-2 tabs po bid prn 12/20/18  Yes McGowen, Adrian Blackwater, MD  Azelastine HCl (ASTEPRO) 0.15 % SOLN 2 sprays each nostril every 12 hours 07/19/14  Yes McGowen, Adrian Blackwater, MD  calcium carbonate (OSCAL) 1500 (600 Ca) MG TABS tablet Take by mouth daily.   Yes [provider]  Cholecalciferol (VITAMIN D3) 5000 units CAPS Take 4 capsules by mouth daily.    Yes [provider]  doxylamine, Sleep, (SLEEP AID) 25 MG tablet Take 25 mg by mouth at bedtime as needed.   Yes [provider]  fluticasone (CUTIVATE) 0.05 % cream Apply topically 2 (two) times daily. 12/14/18  Yes McGowen, Adrian Blackwater, MD  hydrocortisone 2.5 % cream Apply 1 application topically as needed. RECTAL ITCHING 09/08/12  Yes [provider]  levothyroxine (SYNTHROID, LEVOTHROID) 75 MCG tablet TAKE 1/2 (ONE-HALF) TABLET BY MOUTH ONCE DAILY 07/28/18  Yes McGowen, Adrian Blackwater, MD  meclizine (ANTIVERT) 25 MG tablet Take 1 tablet (25 mg total) by mouth every 6 (six) hours as needed for dizziness. 11/17/15  Yes McGowen, Adrian Blackwater, MD  meloxicam (MOBIC) 7.5 MG tablet TAKE 2 TABLETS (15 MG TOTAL) BY MOUTH DAILY. 09/25/18  Yes McGowen, Adrian Blackwater, MD  metoCLOPramide  (REGLAN) 5 MG tablet Take 1 tablet 1 hour prior to drinking the bowel prep. 06/06/19  Yes Danis, Kirke Corin, MD  metoprolol tartrate (LOPRESSOR) 25 MG tablet Take 1 tablet (25 mg total) by mouth daily. 03/15/19  Yes McGowen, Adrian Blackwater, MD  Multiple Vitamins-Iron (MULTIVITAMIN/IRON PO) Take 1 tablet by mouth daily.    Yes [provider]  NIACIN PO Take by mouth daily.    Yes [provider]  omeprazole (PRILOSEC) 40 MG capsule Take 1 capsule (40 mg total) by mouth daily. 12/29/16  Yes McGowen, Adrian Blackwater, MD  Oxycodone HCl 10 MG TABS 10 mg every 4 (four) hours as needed. 1 every 6 hours for pain 12/01/15  Yes [provider]  PEG 3350-KCl-NaBcb-NaCl-NaSulf (PEG-3350/ELECTROLYTES) 236 g SOLR See admin instructions. 06/06/19  Yes [provider]  Probiotic Product (ALIGN) 4 MG CAPS Take 1 capsule (4 mg total) by mouth daily. 10/26/16  Yes Kuneff, Renee A, DO  simvastatin (ZOCOR) 20 MG tablet Take 1 tablet (20 mg total) by mouth at bedtime. 03/15/19  Yes McGowen, Adrian Blackwater, MD  Specialty Vitamins Products (MAGNESIUM, AMINO ACID CHELATE,) 133 MG tablet Take 1 tablet by mouth daily.   Yes [provider]  traZODone (DESYREL) 50 MG tablet Take 1 tablet by mouth at bedtime. 01/15/18  Yes [provider]  levETIRAcetam (KEPPRA) 500 MG tablet Take 1 tablet (500 mg total) by mouth 2 (two) times daily. 02/22/19   McGowen, Adrian Blackwater, MD  predniSONE (DELTASONE) 20 MG tablet TAKE 1 TABLET BY MOUTH TWICE DAILY FOR 3 DAYS AND 1 ONCE DAILY FOR 2 DAYS THEN STOP 06/21/19   [provider]  promethazine (PHENERGAN) 25 MG tablet TAKE ONE TABLET BY MOUTH EVERY 6 HOURS AS NEEDED FOR NAUSEA AND VOMITING 11/29/16  McGowen, Adrian Blackwater, MD    Family History Family History  Problem Relation Age of Onset  . Leukemia Brother   . Lung disease Brother   . Thyroid disease Brother   . Melanoma Brother   . Hypertension Mother   . Thyroid disease Mother   . Hypertension Father   .  Breast cancer Sister        Age 53's  . Hypertension Sister   . Coronary artery disease Sister   . Lupus Sister   . Thyroid disease Sister   . Stomach cancer Brother   . Cancer Other        "all over"  . Parkinson's disease Sister     Social History Social History   Tobacco Use  . Smoking status: Never Smoker  . Smokeless tobacco: Never Used  . Tobacco comment: NEVER USED TOBACCO  Substance Use Topics  . Alcohol use: No    Alcohol/week: 0.0 standard drinks  . Drug use: No     Allergies   Cymbalta [duloxetine hcl], Iohexol, Mepergan [meperidine-promethazine], Morphine, Augmentin [amoxicillin-pot clavulanate], Citalopram, Hctz [hydrochlorothiazide], Lexapro [escitalopram oxalate], Lyrica [pregabalin], and Valsartan   Review of Systems Review of Systems  Constitutional: Positive for diaphoresis and fatigue. Negative for chills and fever.  HENT: Negative for congestion, rhinorrhea and sneezing.   Eyes: Negative.   Respiratory: Negative for cough, chest tightness and shortness of breath.   Cardiovascular: Positive for palpitations. Negative for chest pain and leg swelling.  Gastrointestinal: Negative for abdominal pain, blood in stool, diarrhea, nausea and vomiting.  Genitourinary: Negative for difficulty urinating, flank pain, frequency and hematuria.  Musculoskeletal: Negative for arthralgias and back pain.  Skin: Negative for rash.  Neurological: Positive for light-headedness and headaches. Negative for dizziness, speech difficulty, weakness and numbness.     Physical Exam Updated Vital Signs BP (!) 146/68 (BP Location: Right Arm)   Pulse 68   Temp 98.4 F (36.9 C) (Oral)   Resp 16   Ht '5\' 6"'$  (1.676 m)   Wt 81.6 kg   SpO2 97%   BMI 29.05 kg/m   Physical Exam Constitutional:      Appearance: She is well-developed.  HENT:     Head: Normocephalic and atraumatic.  Eyes:     Pupils: Pupils are equal, round, and reactive to light.  Neck:     Musculoskeletal:  Normal range of motion and neck supple.  Cardiovascular:     Rate and Rhythm: Normal rate and regular rhythm.     Heart sounds: Normal heart sounds.  Pulmonary:     Effort: Pulmonary effort is normal. No respiratory distress.     Breath sounds: Normal breath sounds. No wheezing or rales.  Chest:     Chest wall: No tenderness.  Abdominal:     General: Bowel sounds are normal.     Palpations: Abdomen is soft.     Tenderness: There is no abdominal tenderness. There is no guarding or rebound.  Musculoskeletal: Normal range of motion.  Lymphadenopathy:     Cervical: No cervical adenopathy.  Skin:    General: Skin is warm and dry.     Findings: No rash.  Neurological:     Mental Status: She is alert and oriented to person, place, and time.     Comments: Motor 5/5 all extremities Sensation grossly intact to LT all extremities Finger to Nose intact, no pronator drift CN II-XII grossly intact        ED Treatments / Results  Labs (all  labs ordered are listed, but only abnormal results are displayed) Labs Reviewed  BASIC METABOLIC PANEL  CBC WITH DIFFERENTIAL/PLATELET  URINALYSIS, ROUTINE W REFLEX MICROSCOPIC    EKG EKG Interpretation  Date/Time:  Friday July 06 2019 22:16:12 EDT Ventricular Rate:  62 PR Interval:    QRS Duration: 96 QT Interval:  427 QTC Calculation: 434 R Axis:   13 Text Interpretation:  Sinus rhythm Borderline repolarization abnormality Baseline wander in lead(s) V3 since last tracing no significant change Confirmed by Malvin Johns [phone]) on 07/06/2019 10:24:02 PM   Radiology No results found.  Procedures Procedures (including critical care time)  Medications Ordered in ED Medications - No data to display   Initial Impression / Assessment and Plan / ED Course  I have reviewed the triage vital signs and the nursing notes.  Pertinent labs & imaging results that were available during my care of the patient were reviewed by me and  considered in my medical decision making (see chart for details).        Patient presents to the ED with what appears to be a near syncopal type event.  Her blood pressure was also elevated that has improved on arrival to the ED.  She had taken an extra dose of her metoprolol.  She does not have any neurologic deficits.  No associated chest pain or other suggestions of ACS.  Her EKG shows a normal rhythm with no ischemic changes.  Arrhythmias noted.  Her labs are nonconcerning.  She is feeling better in the ED.  She is able to ambulate without dizziness or ataxia.  She is currently getting some IV fluids and awaiting a urinalysis.  She likely will be discharged following that.  She is already had improvement in symptoms.  I did encourage her to follow-up with her PCP within a few days.  Dr. Florina Ou to f/u on the u/a.  Final Clinical Impressions(s) / ED Diagnoses   Final diagnoses:  None    ED Discharge Orders    None       Malvin Johns, MD 07/06/19 2323

## 2019-07-06 NOTE — ED Triage Notes (Signed)
Pt states approx 530pm pt experienced sudden lightheadedness, took BP when she got home 187/80, took extra 'lol 25mg  with no relief, has felt like her heart was skipping beats w/ weakness. No LOC. No blurry visions. Nausea but no V/D. NAD. A&Ox4

## 2019-07-11 DIAGNOSIS — N3941 Urge incontinence: Secondary | ICD-10-CM | POA: Diagnosis not present

## 2019-07-11 DIAGNOSIS — N3946 Mixed incontinence: Secondary | ICD-10-CM | POA: Diagnosis not present

## 2019-07-12 ENCOUNTER — Telehealth: Payer: Self-pay | Admitting: Gastroenterology

## 2019-07-12 NOTE — Telephone Encounter (Signed)
I reviewed ED provider's note. Blood counts, kidney function normal.  EKG without any rhythm problems.  Stay hydrated during prep and I believe she will be fine for colonoscopy on the 26th.

## 2019-07-12 NOTE — Telephone Encounter (Signed)
Dr. Loletha Carrow, please advise. ED notes in Epic.

## 2019-07-12 NOTE — Telephone Encounter (Signed)
Called patient. She is now aware that Dr. Loletha Carrow approves of proceeding with the colonoscopy and that it is important for the patient to stay hydrated during the prep.

## 2019-07-16 ENCOUNTER — Ambulatory Visit (AMBULATORY_SURGERY_CENTER): Payer: Medicare Other | Admitting: Gastroenterology

## 2019-07-16 ENCOUNTER — Encounter: Payer: Self-pay | Admitting: Gastroenterology

## 2019-07-16 ENCOUNTER — Telehealth: Payer: Self-pay | Admitting: Gastroenterology

## 2019-07-16 ENCOUNTER — Other Ambulatory Visit: Payer: Self-pay

## 2019-07-16 VITALS — BP 146/73 | HR 67 | Temp 97.3°F | Resp 17 | Ht 65.0 in | Wt 186.0 lb

## 2019-07-16 DIAGNOSIS — K635 Polyp of colon: Secondary | ICD-10-CM

## 2019-07-16 DIAGNOSIS — K2970 Gastritis, unspecified, without bleeding: Secondary | ICD-10-CM | POA: Diagnosis not present

## 2019-07-16 DIAGNOSIS — Z1211 Encounter for screening for malignant neoplasm of colon: Secondary | ICD-10-CM | POA: Diagnosis not present

## 2019-07-16 DIAGNOSIS — D122 Benign neoplasm of ascending colon: Secondary | ICD-10-CM

## 2019-07-16 DIAGNOSIS — R1314 Dysphagia, pharyngoesophageal phase: Secondary | ICD-10-CM

## 2019-07-16 DIAGNOSIS — R1310 Dysphagia, unspecified: Secondary | ICD-10-CM | POA: Diagnosis not present

## 2019-07-16 DIAGNOSIS — D123 Benign neoplasm of transverse colon: Secondary | ICD-10-CM

## 2019-07-16 DIAGNOSIS — K219 Gastro-esophageal reflux disease without esophagitis: Secondary | ICD-10-CM | POA: Diagnosis not present

## 2019-07-16 MED ORDER — SODIUM CHLORIDE 0.9 % IV SOLN: 500.0000 mL | Freq: Once | INTRAVENOUS | Status: DC

## 2019-07-16 NOTE — Progress Notes (Signed)
A and O x3. Report to RN. Tolerated MAC anesthesia well.

## 2019-07-16 NOTE — Patient Instructions (Signed)
Please read handouts provided. Continue present medications. Await pathology results.      YOU HAD AN ENDOSCOPIC PROCEDURE TODAY AT THE [facility]:   Refer to the procedure report that was given to you for any specific questions about what was found during the examination.  If the procedure report does not answer your questions, please call your gastroenterologist to clarify.  If you requested that your care partner not be given the details of your procedure findings, then the procedure report has been included in a sealed envelope for you to review at your convenience later.  YOU SHOULD EXPECT: Some feelings of bloating in the abdomen. Passage of more gas than usual.  Walking can help get rid of the air that was put into your GI tract during the procedure and reduce the bloating. If you had a lower endoscopy (such as a colonoscopy or flexible sigmoidoscopy) you may notice spotting of blood in your stool or on the toilet paper. If you underwent a bowel prep for your procedure, you may not have a normal bowel movement for a few days.  Please Note:  You might notice some irritation and congestion in your nose or some drainage.  This is from the oxygen used during your procedure.  There is no need for concern and it should clear up in a day or so.  SYMPTOMS TO REPORT IMMEDIATELY:   Following lower endoscopy (colonoscopy or flexible sigmoidoscopy):  Excessive amounts of blood in the stool  Significant tenderness or worsening of abdominal pains  Swelling of the abdomen that is new, acute  Fever of 100F or higher   Following upper endoscopy (EGD)  Vomiting of blood or coffee ground material  New chest pain or pain under the shoulder blades  Painful or persistently difficult swallowing  New shortness of breath  Fever of 100F or higher  Black, tarry-looking stools  For urgent or emergent issues, a gastroenterologist can be reached at any hour by calling [phone]  [phone].   DIET:  We do recommend a small meal at first, but then you may proceed to your regular diet.  Drink plenty of fluids but you should avoid alcoholic beverages for 24 hours.  ACTIVITY:  You should plan to take it easy for the rest of today and you should NOT DRIVE or use heavy machinery until tomorrow (because of the sedation medicines used during the test).    FOLLOW UP: Our staff will call the number listed on your records 48-72 hours following your procedure to check on you and address any questions or concerns that you may have regarding the information given to you following your procedure. If we do not reach you, we will leave a message.  We will attempt to reach you two times.  During this call, we will ask if you have developed any symptoms of COVID 19. If you develop any symptoms (ie: fever, flu-like symptoms, shortness of breath, cough etc.) before then, please call [phone].  If you test positive for Covid 19 in the 2 weeks post procedure, please call and report this information to us.    If any biopsies were taken you will be contacted by phone or by letter within the next 1-3 weeks.  Please call us at [phone] if you have not heard about the biopsies in 3 weeks.    SIGNATURES/CONFIDENTIALITY: You and/or your care partner have signed paperwork which will be entered into your electronic medical record.  These signatures attest to the fact   that that the information above on your After Visit Summary has been reviewed and is understood.  Full responsibility of the confidentiality of this discharge information lies with you and/or your care-partner. 

## 2019-07-16 NOTE — Op Note (Signed)
[facility] Patient Name: [name] Procedure Date: 07/16/2019 12:24 PM MRN: [id number] Endoscopist: Mallie Mussel L. Loletha Carrow , MD Age: 73 Referring MD:  Date of Birth: [birth year] Gender: Female Account #: [id number] Procedure:                Upper GI endoscopy Indications:              Esophageal dysphagia Medicines:                Monitored Anesthesia Care Procedure:                Pre-Anesthesia Assessment:                           - Prior to the procedure, a History and Physical                            was performed, and patient medications and                            allergies were reviewed. The patient's tolerance of                            previous anesthesia was also reviewed. The risks                            and benefits of the procedure and the sedation                            options and risks were discussed with the patient.                            All questions were answered, and informed consent                            was obtained. Prior Anticoagulants: The patient has                            taken no previous anticoagulant or antiplatelet                            agents. ASA Grade Assessment: II - A patient with                            mild systemic disease. After reviewing the risks                            and benefits, the patient was deemed in                            satisfactory condition to undergo the procedure.                           - Prior to the procedure, a History and Physical  was performed, and patient medications and                            allergies were reviewed. The patient's tolerance of                            previous anesthesia was also reviewed. The risks                            and benefits of the procedure and the sedation                            options and risks were discussed with the patient.                            All questions were answered, and  informed consent                            was obtained. Prior Anticoagulants: The patient has                            taken no previous anticoagulant or antiplatelet                            agents. ASA Grade Assessment: II - A patient with                            mild systemic disease. After reviewing the risks                            and benefits, the patient was deemed in                            satisfactory condition to undergo the procedure.                           After obtaining informed consent, the endoscope was                            passed under direct vision. Throughout the                            procedure, the patient's blood pressure, pulse, and                            oxygen saturations were monitored continuously. The                            Endoscope was introduced through the mouth, and                            advanced to the second part of duodenum. The upper  GI endoscopy was accomplished without difficulty.                            The patient tolerated the procedure well. Scope In: Scope Out: Findings:                 The lower third of the esophagus was tortuous.                           The exam of the esophagus was otherwise normal.                            Specifically, no stricture was seen.                           Diffuse inflammation characterized by adherent                            blood, congestion (edema) and erythema was found in                            the entire examined stomach. Biopsies were taken                            (antrum and body) with a cold forceps for histology.                           The cardia and gastric fundus were normal on                            retroflexion.                           The examined duodenum was normal. Complications:            No immediate complications. Estimated Blood Loss:     Estimated blood loss was minimal. Impression:                - Tortuous esophagus. This appears to be the cause                            of dysphagia.                           - Gastritis. Biopsied.                           - Normal examined duodenum. Recommendation:           - Patient has a contact number available for                            emergencies. The signs and symptoms of potential                            delayed complications were discussed with the  patient. Return to normal activities tomorrow.                            Written discharge instructions were provided to the                            patient.                           - Resume previous diet.                           - Continue present medications.                           - Await pathology results.                           - See the other procedure note for documentation of                            additional recommendations. Kambryn Dapolito L. Loletha Carrow, MD 07/16/2019 2:23:15 PM This report has been signed electronically.

## 2019-07-16 NOTE — Telephone Encounter (Signed)
Patient was told to take her meds with a sip of water.

## 2019-07-16 NOTE — Progress Notes (Signed)
Called to room to assist during endoscopic procedure.  Patient ID and intended procedure confirmed with present staff. Received instructions for my participation in the procedure from the performing physician.  

## 2019-07-16 NOTE — Progress Notes (Signed)
History reviewed today  Temp per YF, VS per [place]

## 2019-07-16 NOTE — Op Note (Signed)
[facility] Patient Name: [name] Procedure Date: 07/16/2019 12:24 PM MRN: [id number] Endoscopist: Mallie Mussel L. Loletha Carrow , MD Age: 73 Referring MD:  Date of Birth: [birth year] Gender: Female Account #: [id number] Procedure:                Colonoscopy Indications:              Screening for colorectal malignant neoplasm (last                            colonoscopy 2007) Medicines:                Monitored Anesthesia Care Procedure:                Pre-Anesthesia Assessment:                           - Prior to the procedure, a History and Physical                            was performed, and patient medications and                            allergies were reviewed. The patient's tolerance of                            previous anesthesia was also reviewed. The risks                            and benefits of the procedure and the sedation                            options and risks were discussed with the patient.                            All questions were answered, and informed consent                            was obtained. Prior Anticoagulants: The patient has                            taken no previous anticoagulant or antiplatelet                            agents. ASA Grade Assessment: II - A patient with                            mild systemic disease. After reviewing the risks                            and benefits, the patient was deemed in                            satisfactory condition to undergo the procedure.  After obtaining informed consent, the colonoscope                            was passed under direct vision. Throughout the                            procedure, the patient's blood pressure, pulse, and                            oxygen saturations were monitored continuously. The                            Colonoscope was introduced through the anus and                            advanced to the the cecum, identified by                             appendiceal orifice and ileocecal valve. The                            colonoscopy was somewhat difficult due to multiple                            diverticula in the colon. Successful completion of                            the procedure was aided by withdrawing the scope                            and replacing with the pediatric colonoscope. The                            patient tolerated the procedure well. The quality                            of the bowel preparation was good. The ileocecal                            valve, appendiceal orifice, and rectum were                            photographed. Scope In: 1:34:09 PM Scope Out: 2:01:25 PM Scope Withdrawal Time: 0 hours 13 minutes 50 seconds  Total Procedure Duration: 0 hours 27 minutes 16 seconds  Findings:                 The perianal and digital rectal examinations were                            normal.                           Multiple diverticula were found in the left colon.  The left colon was significantly tortuous.                           A 8 mm polyp was found in the proximal ascending                            colon. The polyp was sessile with a mucus cap. The                            polyp was removed with a cold snare. Resection and                            retrieval were complete. (Jar 1)                           A 6 mm polyp was found in the proximal transverse                            colon. The polyp was sessile with a mucus cap. The                            polyp was removed with a cold snare. Resection and                            retrieval were complete. (Jar 2)                           The exam was otherwise without abnormality on                            direct and retroflexion views. Complications:            No immediate complications. Estimated Blood Loss:     Estimated blood loss was minimal. Impression:               -  Diverticulosis in the left colon.                           - Tortuous colon.                           - One 8 mm polyp in the proximal ascending colon,                            removed with a cold snare. Resected and retrieved.                           - One 6 mm polyp in the proximal transverse colon,                            removed with a cold snare. Resected and retrieved.                           - The  examination was otherwise normal on direct                            and retroflexion views.                           Patient's chronic constipation appears due to                            diverticulosis and opioids. Recommendation:           - Patient has a contact number available for                            emergencies. The signs and symptoms of potential                            delayed complications were discussed with the                            patient. Return to normal activities tomorrow.                            Written discharge instructions were provided to the                            patient.                           - Resume previous diet.                           - Continue present medications.                           - Await pathology results.                           - No recommendation at this time regarding repeat                            colonoscopy.                           - See the other procedure note for documentation of                            additional recommendations. Laiah Pouncey L. Loletha Carrow, MD 07/16/2019 2:20:03 PM This report has been signed electronically.

## 2019-07-18 ENCOUNTER — Telehealth: Payer: Self-pay

## 2019-07-18 NOTE — Telephone Encounter (Signed)
Second follow up call attempt, no answer, left message.

## 2019-07-18 NOTE — Telephone Encounter (Signed)
First post procedure follow up call, no answer 

## 2019-07-19 DIAGNOSIS — F329 Major depressive disorder, single episode, unspecified: Secondary | ICD-10-CM | POA: Diagnosis not present

## 2019-07-19 DIAGNOSIS — K5900 Constipation, unspecified: Secondary | ICD-10-CM | POA: Diagnosis not present

## 2019-07-19 DIAGNOSIS — G894 Chronic pain syndrome: Secondary | ICD-10-CM | POA: Diagnosis not present

## 2019-07-19 DIAGNOSIS — M961 Postlaminectomy syndrome, not elsewhere classified: Secondary | ICD-10-CM | POA: Diagnosis not present

## 2019-07-23 ENCOUNTER — Encounter: Payer: Self-pay | Admitting: Gastroenterology

## 2019-07-26 ENCOUNTER — Other Ambulatory Visit: Payer: Self-pay | Admitting: Family Medicine

## 2019-08-01 ENCOUNTER — Encounter: Payer: Self-pay | Admitting: Family Medicine

## 2019-08-05 ENCOUNTER — Other Ambulatory Visit: Payer: Self-pay | Admitting: Family Medicine

## 2019-08-06 NOTE — Telephone Encounter (Signed)
Requesting: alprazolam Contract:07/24/18 UDS:07/31/18, [name] Last Visit:06/28/19 Next Visit: advised to f/u 6 mo Last Refill: 12/20/18(180,1)  Please Advise. Medication pending

## 2019-08-07 ENCOUNTER — Other Ambulatory Visit: Payer: Self-pay | Admitting: Family Medicine

## 2019-08-23 ENCOUNTER — Other Ambulatory Visit: Payer: Self-pay

## 2019-08-24 ENCOUNTER — Ambulatory Visit: Payer: Medicare Other | Admitting: Gynecology

## 2019-09-04 NOTE — Progress Notes (Signed)
AWV reviewed and agree. Signed:  Crissie Sickles, MD           09/04/2019

## 2019-09-24 ENCOUNTER — Other Ambulatory Visit: Payer: Self-pay

## 2019-09-24 ENCOUNTER — Ambulatory Visit (INDEPENDENT_AMBULATORY_CARE_PROVIDER_SITE_OTHER): Payer: Medicare Other | Admitting: Internal Medicine

## 2019-09-24 DIAGNOSIS — R42 Dizziness and giddiness: Secondary | ICD-10-CM

## 2019-09-24 MED ORDER — MECLIZINE HCL 25 MG PO TABS: 25.0000 mg | ORAL_TABLET | Freq: Three times a day (TID) | ORAL | 0 refills | Status: DC | PRN

## 2019-09-24 NOTE — Progress Notes (Signed)
Subjective:    Patient ID: [name], female    DOB: January 01, 1946, 74 y.o.   MRN: [id number]  DOS:  09/24/2019 Type of visit - description: Attempted  to make this a video visit, due to technical difficulties from the patient side it was not possible  thus we proceeded with a Virtual Visit via Telephone    I connected with above mentioned patient  by telephone and verified that I am speaking with the correct person using two identifiers.  THIS ENCOUNTER IS A VIRTUAL VISIT DUE TO COVID-19 - PATIENT WAS NOT SEEN IN THE OFFICE. PATIENT HAS CONSENTED TO VIRTUAL VISIT / TELEMEDICINE VISIT   Location of patient: home  Location of provider: office  I discussed the limitations, risks, security and privacy concerns of performing an evaluation and management service by telephone and the availability of in person appointments. I also discussed with the patient that there may be a patient responsible charge related to this service. The patient expressed understanding and agreed to proceed.   History of Present Illness: Acute Symptoms started yesterday, reports dizziness "every time I turn over in bed". Symptoms are short-lived and they get better once she stays still. Symptoms have been consistent every time she turns over. She is able to walk but she has to "hold onto things". Symptoms are similar to previous vertigo episodes but this time they are severe. She has taken meclizine without much help.    Review of Systems Denies chest pain, palpitations.  No blood in the stools. No headache, motor deficits.  No facial numbness or paralysis.  No slurred speech. No fever chills.   Past Medical History:  Diagnosis Date  . Allergic rhinitis   . Anxiety   . Chest pain    cr  . Chronic gastritis    dx'd by EGD 2018 (erosive, likely NSAID-induced). 06/2019 +gastritis.  . Chronic pain syndrome    right knee osteo, hx of TKA in R knee.  Pt was told by [name] that no further surgical  options are available for her; also, chronic lower back pain.--[name].  [name] Chronic renal insufficiency, stage II (mild) 2015   vs acute fluctuations depending on hydration?---GFR from 60s up to 80s.  . Diverticulosis of colon    with hx of 'itis  . DJD (degenerative joint disease)    L spine and knees.  R knee inject 03/15/18, L knee inject 12/26/18 (by Dr. Lynann Bologna).  [name] Epigastric pain    As of 01/2017, GI (Dr. Loletha Carrow) planned EGD.  [name] FATIGUE / MALAISE 07/01/2009   Chronic  . GERD (gastroesophageal reflux disease)   . History of colonic polyps   . HTN (hypertension)   . Hypercholesterolemia    recommended restart of simvastatin 07/2018  . Hypothyroid 07/05/2012  . IBS (irritable bowel syndrome)   . IC (interstitial cystitis)    [name] at [facility]  . Iron deficiency anemia, unspecified 06/27/2013   Has required IV iron on multiple occasions ([name]).  Hemoccults neg 12/2016.  . Lumbar back pain    Spinal stenosis, lumbar region with neurogenic claudication L5-S1 sx's--ESI's no help.  [name] to do CT myelogram as of 07/2018.  . Major depression, recurrent ([place])    vs dysthymia,chronic  . Memory impairment 10/03/2012   Memory impairment 10/03/2012 >referral to neuro    . Monoclonal gammopathy of undetermined significance    IgM kappa MGUS ([name]) routine f/u has shown stability of this problem (most  recent f/u 05/2019)  . Neuromuscular disorder ([place])   . OSA (obstructive sleep apnea)    Latest sleep study 01/2017.  Some adjustments to CPAP being made+ dental referral for consideration of oral appliance --per pulm MD.  . Lebron Quam 04/2014; 03/2018   2015: T score -2.0 FRAX 11%/1.8%.  2019 FRAX 10%/1.5%.  . Otitis externa, candida 05/2329   [name] tx'd her with clotrimazole 1% external solution x 1 mo  . Palpitations   . Psoriasis   . Trochanteric bursitis of both hips    + injections by ortho in the past  . Vitamin D deficiency   . Xerostomia     and xerophthalmia--per [name] (he suggests SSA, SSB, ESR, rh factor, ANA, and antimicrosmal and antithyroglobulin ab's as of 02/17/16--all these labs were NORMAL.    Past Surgical History:  Procedure Laterality Date  . ABDOMINAL HYSTERECTOMY     for prolapse with abdominal incision  . BACK SURGERY  01/23/2015   Lumbar decompression with L2-L5 fusion ([name])  . CARDIAC CATHETERIZATION     no PCI  . CHOLECYSTECTOMY    . COLONOSCOPY  04/2006;07/16/19   Diverticulosis, o/w normal.  06/2019->2 adenomas, +Diverticulosis.  Recall 2023-2025.  [name] DEXA  04/06/2018   T score -1.5 femur neck, -2.2 radius.  FRAX 10%/1.5%.  Repeat DEXA 2 yrs.  . ESOPHAGOGASTRODUODENOSCOPY  05/29/2009; 03/01/17;07/16/19   Esoph normal.  Gastritis (H pylori NEG) + gastric polyps (benign).  Duodenal biopsy NORMAL.  2018-mild chronic gastritis, H pylori NEG. 06/2019 tortuous esophagus w/out stenosis +chronic gastritis H pyl neg.  [name] EYE SURGERY Left    "lazy eye"  . HAND SURGERY Right    TRIGGER FINGER; index finger  . HERNIA REPAIR     umbilical  . KNEE ARTHROSCOPY     RIGHT KNEE X 5  . LAMINOTOMY  2010   L-4, L-5 FUSION  . NASAL SINUS SURGERY     polypectomy (remote past)  . OOPHORECTOMY  2010   Laparoscopic BSO (path benign)  . PATELLA FRACTURE SURGERY  10-08   [name]  . POSTERIOR LAMINECTOMY / DECOMPRESSION LUMBAR SPINE  9-09   [name]  . TOTAL KNEE ARTHROPLASTY  12-07 and 4-07   [name], revision 12-09 [name] at [facility] History  . Marital status: Married    Spouse name: Aeisha Minarik  . Number of children: 0  . Years of education: Not on file  . Highest education level: Not on file  Occupational History    Employer: RETIRED  Tobacco Use  . Smoking status: Never Smoker  . Smokeless tobacco: Never Used  . Tobacco comment: NEVER USED TOBACCO  Substance and Sexual Activity  . Alcohol use: No    Alcohol/week: 0.0 standard drinks  . Drug use: No  . Sexual  activity: Not Currently    Birth control/protection: Surgical    Comment: 1st intercourse 74 yo-Fewer than 5 partners  Other Topics Concern  . Not on file  Social History Narrative   Married, no children.   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   No Tob.  No alc.  No drugs.   Orig from [place].   Social Determinants of Health   Financial Resource Strain:   . Difficulty of Paying Living Expenses: Not on file  Food Insecurity:   . Worried About Charity fundraiser in the Last Year: Not on file  . Ran Out of Food in the Last Year:  Not on file  Transportation Needs:   . Lack of Transportation (Medical): Not on file  . Lack of Transportation (Non-Medical): Not on file  Physical Activity:   . Days of Exercise per Week: Not on file  . Minutes of Exercise per Session: Not on file  Stress:   . Feeling of Stress : Not on file  Social Connections:   . Frequency of Communication with Friends and Family: Not on file  . Frequency of Social Gatherings with Friends and Family: Not on file  . Attends Religious Services: Not on file  . Active Member of Clubs or Organizations: Not on file  . Attends Archivist Meetings: Not on file  . Marital Status: Not on file  Intimate Partner Violence:   . Fear of Current or Ex-Partner: Not on file  . Emotionally Abused: Not on file  . Physically Abused: Not on file  . Sexually Abused: Not on file      Allergies as of 09/24/2019      Reactions   Cymbalta [duloxetine Hcl]    Facial edema   Iohexol     Code: HIVES, Desc: pt states her face and throat swells when administered IV contrast - KB, Onset Date: [id number]   Mepergan [meperidine-promethazine]    Cardiac arrest   Morphine Rash   Augmentin [amoxicillin-pot Clavulanate] Nausea Only   Citalopram Other (See Comments)   Jittery/heart racing   Hctz [hydrochlorothiazide] Other (See Comments)   Too much urinating, felt dried out, etc--NO ALLERGY   Lexapro [escitalopram  Oxalate] Other (See Comments)   Jittery,heart racing   Lyrica [pregabalin] Swelling   Face, hands, fingers, ankles   Valsartan    REACTION: pt states INTOL to Diovan      Medication List       Accurate as of September 24, 2019 10:21 AM. If you have any questions, ask your nurse or doctor.        acetaminophen 325 MG tablet Commonly known as: TYLENOL Take 650 mg by mouth every 6 (six) hours as needed.   Align 4 MG Caps Take 1 capsule (4 mg total) by mouth daily.   ALPRAZolam 1 MG tablet Commonly known as: XANAX TAKE 1 TO 2 TABLETS BY MOUTH TWICE DAILY AS NEEDED   Azelastine HCl 0.15 % Soln Commonly known as: Astepro 2 sprays each nostril every 12 hours   calcium carbonate 1500 (600 Ca) MG Tabs tablet Commonly known as: OSCAL Take by mouth daily.   fluticasone 0.05 % cream Commonly known as: Cutivate Apply topically 2 (two) times daily.   hydrocortisone 2.5 % cream Apply 1 application topically as needed. RECTAL ITCHING   levETIRAcetam 500 MG tablet Commonly known as: KEPPRA Take 1 tablet (500 mg total) by mouth 2 (two) times daily.   levothyroxine 75 MCG tablet Commonly known as: SYNTHROID TAKE 1/2 (ONE-HALF) TABLET BY MOUTH ONCE DAILY   magnesium (amino acid chelate) 133 MG tablet Take 1 tablet by mouth daily.   meclizine 25 MG tablet Commonly known as: ANTIVERT Take 1 tablet (25 mg total) by mouth every 6 (six) hours as needed for dizziness.   meloxicam 7.5 MG tablet Commonly known as: MOBIC TAKE 2 TABLETS (15 MG TOTAL) BY MOUTH DAILY.   metoCLOPramide 5 MG tablet Commonly known as: Reglan Take 1 tablet 1 hour prior to drinking the bowel prep.   metoprolol tartrate 25 MG tablet Commonly known as: LOPRESSOR Take 1 tablet by mouth once daily   MULTIVITAMIN/IRON PO Take 1  tablet by mouth daily.   NIACIN PO Take by mouth daily.   omeprazole 40 MG capsule Commonly known as: PRILOSEC Take 1 capsule (40 mg total) by mouth daily.   Oxycodone HCl 10  MG Tabs 10 mg every 4 (four) hours as needed. 1 every 6 hours for pain   predniSONE 20 MG tablet Commonly known as: DELTASONE TAKE 1 TABLET BY MOUTH TWICE DAILY FOR 3 DAYS AND 1 ONCE DAILY FOR 2 DAYS THEN STOP   promethazine 25 MG tablet Commonly known as: PHENERGAN TAKE ONE TABLET BY MOUTH EVERY 6 HOURS AS NEEDED FOR NAUSEA AND VOMITING   simvastatin 20 MG tablet Commonly known as: ZOCOR Take 1 tablet (20 mg total) by mouth at bedtime.   Sleep Aid 25 MG tablet Generic drug: doxylamine (Sleep) Take 25 mg by mouth at bedtime as needed.   traZODone 50 MG tablet Commonly known as: DESYREL Take 1 tablet by mouth at bedtime.   Vitamin D3 125 MCG (5000 UT) Caps Take 4 capsules by mouth daily.           Objective:   Physical Exam There were no vitals taken for this visit. This is virtual telephone visit.  She is alert oriented x3, she is speaking in complete sentences, speech is somewhat slow but otherwise normal.    Assessment    74 year old female, PMH includes DJD, HTN, high cholesterol, hypothyroidism, IBS, interstitial cystitis, fibromyalgia, depression, on multiple medications including Xanax, oxycodone presents with:  Dizziness:  As described above, symptoms similar to previous vertigo episodes but more intense. No stroke symptoms. I spoke with the patient and her husband, they know the limitations of virtual visit, DDx includes vertigo versus other more serious condition such as a stroke.  The only way I can be more certain is by doing a MRI of the brain.  Alternatively she could stay home, continue taking Antivert, get good hydration and wait for few more days with close attention to other symptoms such as headache, neck pain, slurred speech or any motor deficits.  911 or ER visit if she has different symptoms. They opted for staying home and watch symptoms carefully. Refill for Antivert was sent. They verbalized understanding of my instructions      I discussed  the assessment and treatment plan with the patient. The patient was provided an opportunity to ask questions and all were answered. The patient agreed with the plan and demonstrated an understanding of the instructions.   The patient was advised to call back or seek an in-person evaluation if the symptoms worsen or if the condition fails to improve as anticipated.  I provided 25 minutes of non-face-to-face time during this encounter.  Kathlene November, MD

## 2019-09-26 ENCOUNTER — Telehealth: Payer: Self-pay | Admitting: Family Medicine

## 2019-09-26 MED ORDER — PROMETHAZINE HCL 25 MG PO TABS: ORAL_TABLET | ORAL | 0 refills | Status: DC

## 2019-09-26 NOTE — Telephone Encounter (Signed)
OK done

## 2019-09-26 NOTE — Telephone Encounter (Signed)
Patient had virtual with Dr.Paz for dizziness on 09/24/19. He recommended doing MRI of brain or pt staying home, taking antivert, hydrating and waiting a few more days to see if other symptoms start. Meclizine given to help with dizziness. Denied HA, neck pain or slurred speech.   Please advise if any other recommendations than what Dr.Paz had.

## 2019-09-26 NOTE — Telephone Encounter (Signed)
Husband call regarding wife. Reports she is not any better. She has problem keeping her balance. Had virtual appt by Dr. Larose Kells on 09/24/19.  Husband is requesting for Dr. [name] to see her today or recommend something else.  Please call as soon as possible [phone]

## 2019-09-26 NOTE — Telephone Encounter (Signed)
Patient advised will add to schedule for tomorrow afternoon, if anything changes will notify in the morning. Rx sent to [facility] has been cancelled and new Rx sent to [facility].

## 2019-09-26 NOTE — Telephone Encounter (Signed)
I can't do any imaging w/out actually seeing/examining the patient. I cannot work her in today.  Put her in first available appt spot or recommend she go to UC or the ED. Sorry.

## 2019-09-26 NOTE — Telephone Encounter (Signed)
First appt slot available is 4:15pm tomorrow or 4/4:15pm Monday 11th, it states "virtual" only. Please advise if this slot can be used. The next available after that would be Wednesday 1/13 for 11:30am. Patient's husband requesting nasuea med for patient as well besides antivert.

## 2019-09-26 NOTE — Telephone Encounter (Signed)
4:15 tomorrow in office is fine.   I'll send in nausea med to [facility].

## 2019-09-26 NOTE — Telephone Encounter (Signed)
LM for pt to returncall

## 2019-09-27 ENCOUNTER — Ambulatory Visit (INDEPENDENT_AMBULATORY_CARE_PROVIDER_SITE_OTHER): Payer: Medicare Other | Admitting: Family Medicine

## 2019-09-27 ENCOUNTER — Other Ambulatory Visit: Payer: Self-pay

## 2019-09-27 ENCOUNTER — Encounter: Payer: Self-pay | Admitting: Family Medicine

## 2019-09-27 VITALS — BP 134/81 | HR 62 | Temp 97.7°F | Resp 16 | Ht 65.0 in | Wt 175.0 lb

## 2019-09-27 DIAGNOSIS — R42 Dizziness and giddiness: Secondary | ICD-10-CM

## 2019-09-27 DIAGNOSIS — H8110 Benign paroxysmal vertigo, unspecified ear: Secondary | ICD-10-CM

## 2019-09-27 NOTE — Progress Notes (Signed)
OFFICE VISIT  10/01/2019   CC:  Chief Complaint  Patient presents with  . Dizziness    since visit with Dr.Paz, believes dizziness has worsens and comes and goes    HPI:    Patient is a 74 y.o. Caucasian female who presents accompanied by her husband for dizziness. Dr. Larose Kells did a telephone visit with her 09/23/18, dx was likely BPPV but he discussed option of obs vs imaging.  Pt chose observation + meclizine. Pt states she thinks things have gotten worse, definitely not improving despite doing home epley maneuvers that I've taught her in the past (+ she has a handout).  INTERIM HX: Onset of this was 6 nights ago, raised head up and felt room spinning, "eyes going nuts".  R ear feels full, unclear if connected in time to her sx's.  No change in her chronic bilat tinnitus.  No diarrhea, abd pain, or fevers.  No focal weakness, no recent head trauma, no tremor.  She does feel off balance in between episodes. Still with multiple episodes per day, last about 30 sec or so, +nausea.   ROS: no CP, no SOB, no wheezing, no cough,  no rashes, no melena/hematochezia.  No polyuria or polydipsia. No blurry vision or double vision.   Patient was seen in the ED 07/10/19 for a near syncopal spell and a CT head was done at that time. EXAM: CT HEAD WITHOUT CONTRAST  TECHNIQUE: Contiguous axial images were obtained from the base of the skull through the vertex without intravenous contrast.  COMPARISON:  None.  FINDINGS: Brain: Moderate age-related atrophy and chronic microvascular ischemic changes. Probable small bilateral basal ganglia old lacunar infarcts. There is no acute intracranial hemorrhage. No mass effect or midline shift no extra-axial fluid collections.  Vascular: No hyperdense vessel or unexpected calcification.  Skull: Normal. Negative for fracture or focal lesion.  Sinuses/Orbits: No acute finding.  Other: None  IMPRESSION: 1. No acute intracranial hemorrhage. 2.  Moderate age-related atrophy and chronic microvascular ischemic changes. Past Medical History:  Diagnosis Date  . Allergic rhinitis   . Anxiety   . Chest pain    cr  . Chronic gastritis    dx'd by EGD 2018 (erosive, likely NSAID-induced). 06/2019 +gastritis.  . Chronic pain syndrome    right knee osteo, hx of TKA in R knee.  Pt was told by [name] that no further surgical options are available for her; also, chronic lower back pain.--[name].  [name] Chronic renal insufficiency, stage II (mild) 2015   vs acute fluctuations depending on hydration?---GFR from 60s up to 80s.  . Diverticulosis of colon    with hx of 'itis  . DJD (degenerative joint disease)    L spine and knees.  R knee inject 03/15/18, L knee inject 12/26/18 (by Dr. Lynann Bologna).  [name] Epigastric pain    As of 01/2017, GI (Dr. Loletha Carrow) planned EGD.  [name] FATIGUE / MALAISE 07/01/2009   Chronic  . GERD (gastroesophageal reflux disease)   . History of colonic polyps   . HTN (hypertension)   . Hypercholesterolemia    recommended restart of simvastatin 07/2018  . Hypothyroid 07/05/2012  . IBS (irritable bowel syndrome)   . IC (interstitial cystitis)    [name] at [place]  . Iron deficiency anemia, unspecified 06/27/2013   Has required IV iron on multiple occasions ([name]).  Hemoccults neg 12/2016.  . Lumbar back pain    Spinal stenosis, lumbar region with neurogenic claudication L5-S1 sx's--ESI's no  help.  [name] to do CT myelogram as of 07/2018.  . Major depression, recurrent ([place])    vs dysthymia,chronic  . Memory impairment 10/03/2012   Memory impairment 10/03/2012 >referral to neuro    . Monoclonal gammopathy of undetermined significance    IgM kappa MGUS ([name]) routine f/u has shown stability of this problem (most recent f/u 05/2019)  . Neuromuscular disorder ([place])   . OSA (obstructive sleep apnea)    Latest sleep study 01/2017.  Some adjustments to CPAP being made+ dental referral for  consideration of oral appliance --per pulm MD.  . Lebron Quam 04/2014; 03/2018   2015: T score -2.0 FRAX 11%/1.8%.  2019 FRAX 10%/1.5%.  . Otitis externa, candida 18/4037   [name] tx'd her with clotrimazole 1% external solution x 1 mo  . Palpitations   . Psoriasis   . Trochanteric bursitis of both hips    + injections by ortho in the past  . Vitamin D deficiency   . Xerostomia    and xerophthalmia--per [name] (he suggests SSA, SSB, ESR, rh factor, ANA, and antimicrosmal and antithyroglobulin ab's as of 02/17/16--all these labs were NORMAL.    Past Surgical History:  Procedure Laterality Date  . ABDOMINAL HYSTERECTOMY     for prolapse with abdominal incision  . BACK SURGERY  01/23/2015   Lumbar decompression with L2-L5 fusion ([name])  . CARDIAC CATHETERIZATION     no PCI  . CHOLECYSTECTOMY    . COLONOSCOPY  04/2006;07/16/19   Diverticulosis, o/w normal.  06/2019->2 adenomas, +Diverticulosis.  Recall 2023-2025.  [name] DEXA  04/06/2018   T score -1.5 femur neck, -2.2 radius.  FRAX 10%/1.5%.  Repeat DEXA 2 yrs.  . ESOPHAGOGASTRODUODENOSCOPY  05/29/2009; 03/01/17;07/16/19   Esoph normal.  Gastritis (H pylori NEG) + gastric polyps (benign).  Duodenal biopsy NORMAL.  2018-mild chronic gastritis, H pylori NEG. 06/2019 tortuous esophagus w/out stenosis +chronic gastritis H pyl neg.  [name] EYE SURGERY Left    "lazy eye"  . HAND SURGERY Right    TRIGGER FINGER; index finger  . HERNIA REPAIR     umbilical  . KNEE ARTHROSCOPY     RIGHT KNEE X 5  . LAMINOTOMY  2010   L-4, L-5 FUSION  . NASAL SINUS SURGERY     polypectomy (remote past)  . OOPHORECTOMY  2010   Laparoscopic BSO (path benign)  . PATELLA FRACTURE SURGERY  10-08   [name]  . POSTERIOR LAMINECTOMY / DECOMPRESSION LUMBAR SPINE  9-09   [name]  . TOTAL KNEE ARTHROPLASTY  12-07 and 4-07   [name], revision 12-09 [name] at [facility]    Outpatient Medications Prior to Visit  Medication Sig Dispense Refill  . acetaminophen  (TYLENOL) 325 MG tablet Take 650 mg by mouth every 6 (six) hours as needed.    . ALPRAZolam (XANAX) 1 MG tablet TAKE 1 TO 2 TABLETS BY MOUTH TWICE DAILY AS NEEDED 120 tablet 1  . calcium carbonate (OSCAL) 1500 (600 Ca) MG TABS tablet Take by mouth daily.    . Cholecalciferol (VITAMIN D3) 5000 units CAPS Take 4 capsules by mouth daily.     [name] doxylamine, Sleep, (SLEEP AID) 25 MG tablet Take 25 mg by mouth at bedtime as needed.    . hydrocortisone 2.5 % cream Apply 1 application topically as needed. RECTAL ITCHING    . levothyroxine (SYNTHROID, LEVOTHROID) 75 MCG tablet TAKE 1/2 (ONE-HALF) TABLET BY MOUTH ONCE DAILY 45 tablet 3  . meclizine (ANTIVERT) 25  MG tablet Take 1 tablet (25 mg total) by mouth 3 (three) times daily as needed for dizziness. 45 tablet 0  . meloxicam (MOBIC) 7.5 MG tablet TAKE 2 TABLETS (15 MG TOTAL) BY MOUTH DAILY. 180 tablet 1  . metoprolol tartrate (LOPRESSOR) 25 MG tablet Take 1 tablet by mouth once daily 90 tablet 1  . Multiple Vitamins-Iron (MULTIVITAMIN/IRON PO) Take 1 tablet by mouth daily.     [name] NIACIN PO Take by mouth daily.     . Oxycodone HCl 10 MG TABS 10 mg every 4 (four) hours as needed. 1 every 6 hours for pain    . promethazine (PHENERGAN) 25 MG tablet TAKE ONE TABLET BY MOUTH EVERY 6 HOURS AS NEEDED FOR NAUSEA AND VOMITING 90 tablet 0  . simvastatin (ZOCOR) 20 MG tablet Take 1 tablet (20 mg total) by mouth at bedtime. 90 tablet 0  . Specialty Vitamins Products (MAGNESIUM, AMINO ACID CHELATE,) 133 MG tablet Take 1 tablet by mouth daily.    . traZODone (DESYREL) 50 MG tablet Take 1 tablet by mouth at bedtime.  2  . Azelastine HCl (ASTEPRO) 0.15 % SOLN 2 sprays each nostril every 12 hours (Patient not taking: Reported on 09/27/2019) 30 mL 11  . fluticasone (CUTIVATE) 0.05 % cream Apply topically 2 (two) times daily. (Patient not taking: Reported on 09/27/2019) 30 g 1  . levETIRAcetam (KEPPRA) 500 MG tablet Take 1 tablet (500 mg total) by mouth 2 (two) times daily.  (Patient not taking: Reported on 07/16/2019) 60 tablet 0  . omeprazole (PRILOSEC) 40 MG capsule Take 1 capsule (40 mg total) by mouth daily. (Patient not taking: Reported on 07/16/2019) 30 capsule 3  . Probiotic Product (ALIGN) 4 MG CAPS Take 1 capsule (4 mg total) by mouth daily. (Patient not taking: Reported on 09/27/2019) 60 capsule 0  . metoCLOPramide (REGLAN) 5 MG tablet Take 1 tablet 1 hour prior to drinking the bowel prep. (Patient not taking: Reported on 07/16/2019) 2 tablet 0   No facility-administered medications prior to visit.    Allergies  Allergen Reactions  . Cymbalta [Duloxetine Hcl]     Facial edema  . Iohexol      Code: HIVES, Desc: pt states her face and throat swells when administered IV contrast - KB, Onset Date: [id number]   . Mepergan [Meperidine-Promethazine]     Cardiac arrest  . Morphine Rash  . Augmentin [Amoxicillin-Pot Clavulanate] Nausea Only  . Citalopram Other (See Comments)    Jittery/heart racing   . Hctz [Hydrochlorothiazide] Other (See Comments)    Too much urinating, felt dried out, etc--NO ALLERGY  . Lexapro [Escitalopram Oxalate] Other (See Comments)    Jittery,heart racing  . Lyrica [Pregabalin] Swelling    Face, hands, fingers, ankles  . Valsartan     REACTION: pt states INTOL to Diovan    ROS As per HPI  PE: Blood pressure 134/81, pulse 62, temperature 97.7 F (36.5 C), temperature source Temporal, resp. rate 16, height _0  (1.651 m), weight 175 lb (79.4 kg), SpO2 97 %. Gen: Alert, tired- appearing.  Patient is oriented to person, place, time, and situation. AFFECT: pleasant, lucid thought and speech. CV: RRR, no m/r/g.   LUNGS: CTA bilat, nonlabored resps, good aeration in all lung fields. Neuro: CN 2-12 intact bilaterally, strength 5/5 in proximal and distal upper extremities and lower extremities bilaterally.  No sensory deficits.  No tremor.  No disdiadochokinesis.  Walked a little slow and mild generalized 'wavering" but not  veering to one  side.  Upper extremity and lower extremity DTRs symmetric.  No pronator drift. Dix Halpike: neg upon lying back with head turn to R or L, but upon sitting up she has severe vertigo, nausea, and some erratic, fleeting change in direction of gaze but no true nystagmus.     LABS:  None today    Chemistry      Component Value Date/Time   NA 138 07/06/2019 2228   NA 138 03/25/2017 1333   NA 138 11/25/2016 1147   K 4.0 07/06/2019 2228   K 4.3 03/25/2017 1333   K 3.8 11/25/2016 1147   CL 106 07/06/2019 2228   CL 104 03/25/2017 1333   CL 103 01/15/2010 0835   CO2 21 (L) 07/06/2019 2228   CO2 24 03/25/2017 1333   CO2 25 11/25/2016 1147   BUN 28 (H) 07/06/2019 2228   BUN 12 03/25/2017 1333   BUN 15.3 11/25/2016 1147   CREATININE 0.81 07/06/2019 2228   CREATININE 0.88 06/05/2019 1108   CREATININE 0.76 03/25/2017 1333   CREATININE 0.8 11/25/2016 1147      Component Value Date/Time   CALCIUM 9.4 07/06/2019 2228   CALCIUM 9.5 03/25/2017 1333   CALCIUM 9.2 11/25/2016 1147   ALKPHOS 76 06/05/2019 1108   ALKPHOS 111 03/25/2017 1333   ALKPHOS 99 11/25/2016 1147   AST 15 06/05/2019 1108   AST 24 11/25/2016 1147   ALT 18 06/05/2019 1108   ALT 30 11/25/2016 1147   BILITOT 0.4 06/05/2019 1108   BILITOT 0.61 11/25/2016 1147       IMPRESSION AND PLAN:  BPPV, this is her 2nd time of having a bout of this, seems to be lingering quite a while. She has been doing home epley maneuvers already.  Will refer to PT for more intensive vertigo PT. Meclizine no help so she'll stop trying this med. Reassured pt that I do not think she is having a CVA or any other intracranial problem such as aneurism or mass or infection.  Spent 35 min with pt today, with >50% of this time spent in counseling and care coordination regarding the above problems.  An After Visit Summary was printed and given to the patient.  FOLLOW UP: Return for to be determined based on response to  therapy.  Signed:  Crissie Sickles, MD           10/01/2019

## 2019-10-03 DIAGNOSIS — H8111 Benign paroxysmal vertigo, right ear: Secondary | ICD-10-CM | POA: Diagnosis not present

## 2019-10-04 ENCOUNTER — Inpatient Hospital Stay: Payer: Medicare Other | Attending: Hematology & Oncology

## 2019-10-04 ENCOUNTER — Telehealth: Payer: Self-pay | Admitting: Hematology & Oncology

## 2019-10-04 ENCOUNTER — Inpatient Hospital Stay (HOSPITAL_BASED_OUTPATIENT_CLINIC_OR_DEPARTMENT_OTHER): Payer: Medicare Other | Admitting: Hematology & Oncology

## 2019-10-04 ENCOUNTER — Encounter: Payer: Self-pay | Admitting: Hematology & Oncology

## 2019-10-04 ENCOUNTER — Inpatient Hospital Stay: Payer: Medicare Other

## 2019-10-04 ENCOUNTER — Telehealth: Payer: Self-pay

## 2019-10-04 ENCOUNTER — Other Ambulatory Visit: Payer: Self-pay

## 2019-10-04 VITALS — BP 129/80 | HR 61 | Temp 97.1°F | Resp 20 | Wt 187.1 lb

## 2019-10-04 DIAGNOSIS — D472 Monoclonal gammopathy: Secondary | ICD-10-CM | POA: Diagnosis not present

## 2019-10-04 DIAGNOSIS — D509 Iron deficiency anemia, unspecified: Secondary | ICD-10-CM | POA: Diagnosis not present

## 2019-10-04 DIAGNOSIS — D500 Iron deficiency anemia secondary to blood loss (chronic): Secondary | ICD-10-CM | POA: Diagnosis not present

## 2019-10-04 DIAGNOSIS — D508 Other iron deficiency anemias: Secondary | ICD-10-CM

## 2019-10-04 LAB — CMP (CANCER CENTER ONLY)
ALT: 15 U/L (ref 0–44)
AST: 14 U/L — ABNORMAL LOW (ref 15–41)
Albumin: 4.5 g/dL (ref 3.5–5.0)
Alkaline Phosphatase: 77 U/L (ref 38–126)
Anion gap: 7 (ref 5–15)
BUN: 24 mg/dL — ABNORMAL HIGH (ref 8–23)
CO2: 30 mmol/L (ref 22–32)
Calcium: 9.7 mg/dL (ref 8.9–10.3)
Chloride: 103 mmol/L (ref 98–111)
Creatinine: 1.11 mg/dL — ABNORMAL HIGH (ref 0.44–1.00)
GFR, Est AFR Am: 57 mL/min — ABNORMAL LOW (ref >60)
GFR, Estimated: 50 mL/min — ABNORMAL LOW (ref >60)
Glucose, Bld: 111 mg/dL — ABNORMAL HIGH (ref 70–99)
Potassium: 4 mmol/L (ref 3.5–5.1)
Sodium: 140 mmol/L (ref 135–145)
Total Bilirubin: 0.4 mg/dL (ref 0.3–1.2)
Total Protein: 7.1 g/dL (ref 6.5–8.1)

## 2019-10-04 LAB — CBC WITH DIFFERENTIAL (CANCER CENTER ONLY)
Abs Immature Granulocytes: 0.02 10*3/uL (ref 0.00–0.07)
Basophils Absolute: 0.1 10*3/uL (ref 0.0–0.1)
Basophils Relative: 1 %
Eosinophils Absolute: 0.4 10*3/uL (ref 0.0–0.5)
Eosinophils Relative: 6 %
HCT: 43.5 % (ref 36.0–46.0)
Hemoglobin: 14.3 g/dL (ref 12.0–15.0)
Immature Granulocytes: 0 %
Lymphocytes Relative: 40 %
Lymphs Abs: 2.7 10*3/uL (ref 0.7–4.0)
MCH: 28.1 pg (ref 26.0–34.0)
MCHC: 32.9 g/dL (ref 30.0–36.0)
MCV: 85.5 fL (ref 80.0–100.0)
Monocytes Absolute: 0.3 10*3/uL (ref 0.1–1.0)
Monocytes Relative: 5 %
Neutro Abs: 3.2 10*3/uL (ref 1.7–7.7)
Neutrophils Relative %: 48 %
Platelet Count: 215 10*3/uL (ref 150–400)
RBC: 5.09 MIL/uL (ref 3.87–5.11)
RDW: 12.3 % (ref 11.5–15.5)
WBC Count: 6.8 10*3/uL (ref 4.0–10.5)
nRBC: 0 % (ref 0.0–0.2)

## 2019-10-04 LAB — IRON AND TIBC
Iron: 61 ug/dL (ref 41–142)
Saturation Ratios: 20 % — ABNORMAL LOW (ref 21–57)
TIBC: 310 ug/dL (ref 236–444)
UIBC: 249 ug/dL (ref 120–384)

## 2019-10-04 LAB — FERRITIN: Ferritin: 153 ng/mL (ref 11–307)

## 2019-10-04 NOTE — Telephone Encounter (Signed)
Received PT initial evaluation, placed on PCP desk to review and sign, if appropriate.

## 2019-10-04 NOTE — Progress Notes (Signed)
Hematology and Oncology Follow Up Visit  [name] [id number] [birth year] 74 y.o. 10/05/2018   Principle Diagnosis:  1. IgM kappa monoclonal gammopathy of undetermined significance 2. Intermittent iron deficiency anemia  Current Therapy:   IV iron as indicated - last received in July 2019   Interim History:  [name] is here today for follow-up.  She is managing the coronavirus.  Unfortunately, she has to be at home.  Her husband is still drinking.  Hopefully, and thankfully, she has not been hurt.  She still has some occasional pains.  She does feel a little tired.  Her iron saturation is only 20%.  I know that she is not really anemic but I think a dose of iron certainly might help her out.  There is been no issues with her bowels or bladder.  When we last saw her back in September, her M spike was down to 0.3 g/dL.  Her IgM level is 630 mg/dL.  Currently, her performance status is ECOG 1.  Medications:  Allergies as of 10/05/2018      Reactions   Cymbalta [duloxetine Hcl]    Facial edema   Iohexol     Code: HIVES, Desc: pt states her face and throat swells when administered IV contrast - KB, Onset Date: [id number]   Mepergan [meperidine-promethazine]    Cardiac arrest   Morphine Rash   Augmentin [amoxicillin-pot Clavulanate] Nausea Only   Citalopram Other (See Comments)   Jittery/heart racing   Hctz [hydrochlorothiazide] Other (See Comments)   Too much urinating, felt dried out, etc--NO ALLERGY   Lexapro [escitalopram Oxalate] Other (See Comments)   Jittery,heart racing   Lyrica [pregabalin] Swelling   Face, hands, fingers, ankles   Valsartan    REACTION: pt states INTOL to Diovan      Medication List       Accurate as of October 05, 2018 12:55 PM. Always use your most recent med list.        acetaminophen 325 MG tablet Commonly known as:  TYLENOL Take 650 mg by mouth every 6 (six) hours as needed.   ALIGN 4 MG Caps Take 1 capsule (4 mg total) by  mouth daily.   ALPRAZolam 0.5 MG tablet Commonly known as:  XANAX TAKE ONE-HALF TO ONE TABLET BY MOUTH THREE TIMES DAILY   Azelastine HCl 0.15 % Soln Commonly known as:  ASTEPRO 2 sprays each nostril every 12 hours   calcium carbonate 1500 (600 Ca) MG Tabs tablet Commonly known as:  OSCAL Take by mouth daily.   dicyclomine 10 MG capsule Commonly known as:  BENTYL Take 1 capsule (10 mg total) by mouth 3 (three) times daily before meals.   hydrocortisone 2.5 % cream Apply 1 application topically as needed. RECTAL ITCHING   levothyroxine 75 MCG tablet Commonly known as:  SYNTHROID, LEVOTHROID TAKE 1/2 (ONE-HALF) TABLET BY MOUTH ONCE DAILY   magnesium (amino acid chelate) 133 MG tablet Take 1 tablet by mouth daily.   meclizine 25 MG tablet Commonly known as:  ANTIVERT Take 1 tablet (25 mg total) by mouth every 6 (six) hours as needed for dizziness.   MELATONIN PO Take by mouth.   meloxicam 7.5 MG tablet Commonly known as:  MOBIC TAKE 2 TABLETS (15 MG TOTAL) BY MOUTH DAILY.   metoprolol tartrate 25 MG tablet Commonly known as:  LOPRESSOR Take 1 tablet (25 mg total) by mouth daily.   MULTIVITAMIN/IRON PO Take 1 tablet by mouth daily.   NIACIN PO Take by  mouth.   omeprazole 40 MG capsule Commonly known as:  PRILOSEC Take 1 capsule (40 mg total) by mouth daily.   Oxycodone HCl 10 MG Tabs 10 mg every 4 (four) hours as needed. 1 every 6 hours for pain   promethazine 25 MG tablet Commonly known as:  PHENERGAN TAKE ONE TABLET BY MOUTH EVERY 6 HOURS AS NEEDED FOR NAUSEA AND VOMITING   RED YEAST RICE PO Take by mouth.   simvastatin 20 MG tablet Commonly known as:  ZOCOR Take 1 tablet (20 mg total) by mouth at bedtime.   SLEEP AID 25 MG tablet Generic drug:  doxylamine (Sleep) Take 25 mg by mouth at bedtime as needed.   traZODone 50 MG tablet Commonly known as:  DESYREL Take 1 tablet by mouth at bedtime.   Vitamin D3 125 MCG (5000 UT) Caps Take 2 capsules  by mouth daily.       Allergies:  Allergies  Allergen Reactions  . Cymbalta [Duloxetine Hcl]     Facial edema  . Iohexol      Code: HIVES, Desc: pt states her face and throat swells when administered IV contrast - KB, Onset Date: [id number]   . Mepergan [Meperidine-Promethazine]     Cardiac arrest  . Morphine Rash  . Augmentin [Amoxicillin-Pot Clavulanate] Nausea Only  . Citalopram Other (See Comments)    Jittery/heart racing   . Hctz [Hydrochlorothiazide] Other (See Comments)    Too much urinating, felt dried out, etc--NO ALLERGY  . Lexapro [Escitalopram Oxalate] Other (See Comments)    Jittery,heart racing  . Lyrica [Pregabalin] Swelling    Face, hands, fingers, ankles  . Valsartan     REACTION: pt states INTOL to Diovan    Past Medical History, Surgical history, Social history, and Family History were reviewed and updated.  Review of Systems: Review of Systems  Constitutional: Positive for malaise/fatigue.  Eyes: Positive for blurred vision.  Respiratory: Positive for wheezing.   Cardiovascular: Positive for palpitations.  Gastrointestinal: Positive for nausea.  Genitourinary: Positive for urgency.  Musculoskeletal: Positive for back pain, joint pain and myalgias.  Neurological: Positive for tingling.  Endo/Heme/Allergies: Negative.   Psychiatric/Behavioral: Negative.      Physical Exam:  weight is 174 lb (78.9 kg). Her oral temperature is 97.8 F (36.6 C). Her blood pressure is 135/52 (abnormal) and her pulse is 52 (abnormal). Her respiration is 18 and oxygen saturation is 98%.   Wt Readings from Last 3 Encounters:  10/05/18 174 lb (78.9 kg)  09/05/18 178 lb (80.7 kg)  07/24/18 174 lb 8 oz (79.2 kg)    Physical Exam Vitals signs reviewed.  HENT:     Head: Normocephalic and atraumatic.  Eyes:     Pupils: Pupils are equal, round, and reactive to light.  Neck:     Musculoskeletal: Normal range of motion.  Cardiovascular:     Rate and Rhythm: Normal  rate and regular rhythm.     Heart sounds: Normal heart sounds.  Pulmonary:     Effort: Pulmonary effort is normal.     Breath sounds: Normal breath sounds.  Abdominal:     General: Bowel sounds are normal.     Palpations: Abdomen is soft.  Musculoskeletal: Normal range of motion.        General: No tenderness or deformity.  Lymphadenopathy:     Cervical: No cervical adenopathy.  Skin:    General: Skin is warm and dry.     Findings: No erythema or rash.  Neurological:  Mental Status: She is alert and oriented to person, place, and time.  Psychiatric:        Behavior: Behavior normal.        Thought Content: Thought content normal.        Judgment: Judgment normal.     Lab Results  Component Value Date   WBC 14.1 (H) 10/05/2018   HGB 14.8 10/05/2018   HCT 45.9 10/05/2018   MCV 91.4 10/05/2018   PLT 260 10/05/2018   Lab Results  Component Value Date   FERRITIN 143 04/06/2018   IRON 31 (L) 04/06/2018   TIBC 284 04/06/2018   UIBC 253 04/06/2018   IRONPCTSAT 11 (L) 04/06/2018   Lab Results  Component Value Date   RETICCTPCT 1.2 01/20/2011   RBC 5.02 10/05/2018   RETICCTABS 52.2 01/20/2011   Lab Results  Component Value Date   KPAFRELGTCHN 17.3 04/06/2018   LAMBDASER 16.6 04/06/2018   KAPLAMBRATIO 1.04 04/06/2018   Lab Results  Component Value Date   IGGSERUM 667 (L) 04/06/2018   IGA 287 04/06/2018   IGMSERUM 630 (H) 04/06/2018   Lab Results  Component Value Date   TOTALPROTELP 6.7 04/06/2018   ALBUMINELP 3.3 04/06/2018   A1GS 0.2 04/06/2018   A2GS 0.9 04/06/2018   BETS 1.0 04/06/2018   BETA2SER 0.5 06/19/2015   GAMS 1.2 04/06/2018   MSPIKE 0.5 (H) 04/06/2018   SPEI Comment 10/06/2017     Chemistry      Component Value Date/Time   NA 142 10/05/2018 1108   NA 138 03/25/2017 1333   NA 138 11/25/2016 1147   K 3.8 10/05/2018 1108   K 4.3 03/25/2017 1333   K 3.8 11/25/2016 1147   CL 103 10/05/2018 1108   CL 104 03/25/2017 1333   CL 103  01/15/2010 0835   CO2 29 10/05/2018 1108   CO2 24 03/25/2017 1333   CO2 25 11/25/2016 1147   BUN 12 10/05/2018 1108   BUN 12 03/25/2017 1333   BUN 15.3 11/25/2016 1147   CREATININE 0.93 10/05/2018 1108   CREATININE 0.76 03/25/2017 1333   CREATININE 0.8 11/25/2016 1147      Component Value Date/Time   CALCIUM 9.4 10/05/2018 1108   CALCIUM 9.5 03/25/2017 1333   CALCIUM 9.2 11/25/2016 1147   ALKPHOS 104 10/05/2018 1108   ALKPHOS 111 03/25/2017 1333   ALKPHOS 99 11/25/2016 1147   AST 11 (L) 10/05/2018 1108   AST 24 11/25/2016 1147   ALT 14 10/05/2018 1108   ALT 30 11/25/2016 1147   BILITOT 0.5 10/05/2018 1108   BILITOT 0.61 11/25/2016 1147      Impression and Plan: Ms. Crisan is a very pleasant 74 yo caucasian female with MGUS and history of iron deficiency.   I am glad to see that her M spike actually was better.  We will see what it is this visit.  Again we will give her a dose of IV iron.  I do think this would be a bad idea for [place].  We will plan to get her back in another 6 months.  I think this is reasonable.  As always, we was have good fellowship.  I know that she has a strong faith.  I just wish that her husband would also have faith.  It sounds like he just does not.   [name], MD 1/16/202012:55 PM

## 2019-10-04 NOTE — Telephone Encounter (Signed)
Signed and put in box to go up front.  

## 2019-10-04 NOTE — Telephone Encounter (Signed)
Appointments scheduled calendar printed per 1/14 los

## 2019-10-05 ENCOUNTER — Telehealth: Payer: Self-pay | Admitting: Hematology & Oncology

## 2019-10-05 DIAGNOSIS — N3946 Mixed incontinence: Secondary | ICD-10-CM | POA: Diagnosis not present

## 2019-10-05 LAB — PROTEIN ELECTROPHORESIS, SERUM
A/G Ratio: 1.3 (ref 0.7–1.7)
Albumin ELP: 4 g/dL (ref 2.9–4.4)
Alpha-1-Globulin: 0.2 g/dL (ref 0.0–0.4)
Alpha-2-Globulin: 0.8 g/dL (ref 0.4–1.0)
Beta Globulin: 1 g/dL (ref 0.7–1.3)
Gamma Globulin: 1.1 g/dL (ref 0.4–1.8)
Globulin, Total: 3.1 g/dL (ref 2.2–3.9)
M-Spike, %: 0.4 g/dL — ABNORMAL HIGH
Total Protein ELP: 7.1 g/dL (ref 6.0–8.5)

## 2019-10-05 LAB — IGG, IGA, IGM
IgA: 261 mg/dL (ref 64–422)
IgG (Immunoglobin G), Serum: 719 mg/dL (ref 586–1602)
IgM (Immunoglobulin M), Srm: 629 mg/dL — ABNORMAL HIGH (ref 26–217)

## 2019-10-05 LAB — KAPPA/LAMBDA LIGHT CHAINS
Kappa free light chain: 24 mg/L — ABNORMAL HIGH (ref 3.3–19.4)
Kappa, lambda light chain ratio: 1.26 (ref 0.26–1.65)
Lambda free light chains: 19.1 mg/L (ref 5.7–26.3)

## 2019-10-05 NOTE — Telephone Encounter (Signed)
Called and spoke with patient regarding appointment added for iron infusion per 1/14 result note

## 2019-10-10 ENCOUNTER — Telehealth: Payer: Self-pay | Admitting: Family Medicine

## 2019-10-10 DIAGNOSIS — R42 Dizziness and giddiness: Secondary | ICD-10-CM

## 2019-10-10 NOTE — Telephone Encounter (Signed)
Patient is requesting NEW referral to ENT specialist. Current ENT is [facility].  She does not want to go to them, she prefers going to someone within [facility] network or [facility]  Please call [phone]

## 2019-10-10 NOTE — Telephone Encounter (Signed)
Patient stated she is still having dizziness after visit with PT in [place]. She feels like she has a sinus infection, R ear still feels full. I did not see any referral for ENT in the past.  Please advise, thanks.

## 2019-10-10 NOTE — Telephone Encounter (Signed)
OK ENT referral ordered.

## 2019-10-10 NOTE — Telephone Encounter (Signed)
Please contact patient about referral.

## 2019-10-12 ENCOUNTER — Inpatient Hospital Stay: Payer: Medicare Other

## 2019-10-12 ENCOUNTER — Other Ambulatory Visit: Payer: Self-pay

## 2019-10-12 VITALS — BP 131/81 | HR 68 | Temp 97.7°F | Resp 19

## 2019-10-12 DIAGNOSIS — D500 Iron deficiency anemia secondary to blood loss (chronic): Secondary | ICD-10-CM

## 2019-10-12 DIAGNOSIS — D509 Iron deficiency anemia, unspecified: Secondary | ICD-10-CM | POA: Diagnosis not present

## 2019-10-12 DIAGNOSIS — D472 Monoclonal gammopathy: Secondary | ICD-10-CM | POA: Diagnosis not present

## 2019-10-12 MED ORDER — HEPARIN SOD (PORK) LOCK FLUSH 100 UNIT/ML IV SOLN
250.0000 [IU] | Freq: Once | INTRAVENOUS | Status: DC | PRN
  Filled 2019-10-12: qty 5

## 2019-10-12 MED ORDER — HEPARIN SOD (PORK) LOCK FLUSH 100 UNIT/ML IV SOLN
500.0000 [IU] | Freq: Once | INTRAVENOUS | Status: DC | PRN
  Filled 2019-10-12: qty 5

## 2019-10-12 MED ORDER — SODIUM CHLORIDE 0.9 % IV SOLN
Freq: Once | INTRAVENOUS | Status: AC
  Filled 2019-10-12: qty 250

## 2019-10-12 MED ORDER — SODIUM CHLORIDE 0.9 % IV SOLN
510.0000 mg | Freq: Once | INTRAVENOUS | Status: AC
  Administered 2019-10-12: 13:00:00 510 mg via INTRAVENOUS
  Filled 2019-10-12: qty 510

## 2019-10-12 NOTE — Patient Instructions (Signed)

## 2019-10-18 DIAGNOSIS — H9113 Presbycusis, bilateral: Secondary | ICD-10-CM | POA: Diagnosis not present

## 2019-10-18 DIAGNOSIS — J310 Chronic rhinitis: Secondary | ICD-10-CM | POA: Diagnosis not present

## 2019-10-18 DIAGNOSIS — H9313 Tinnitus, bilateral: Secondary | ICD-10-CM | POA: Diagnosis not present

## 2019-10-18 DIAGNOSIS — R42 Dizziness and giddiness: Secondary | ICD-10-CM | POA: Diagnosis not present

## 2019-10-18 NOTE — Telephone Encounter (Signed)
Patient is scheduled with [name] [id number]

## 2019-10-21 DIAGNOSIS — R42 Dizziness and giddiness: Secondary | ICD-10-CM | POA: Insufficient documentation

## 2019-10-21 DIAGNOSIS — H9113 Presbycusis, bilateral: Secondary | ICD-10-CM | POA: Insufficient documentation

## 2019-10-21 DIAGNOSIS — H9313 Tinnitus, bilateral: Secondary | ICD-10-CM | POA: Insufficient documentation

## 2019-11-06 ENCOUNTER — Encounter: Payer: Self-pay | Admitting: Family Medicine

## 2019-11-18 ENCOUNTER — Ambulatory Visit: Payer: Medicare Other | Attending: Internal Medicine

## 2019-11-18 DIAGNOSIS — Z23 Encounter for immunization: Secondary | ICD-10-CM | POA: Insufficient documentation

## 2019-11-18 NOTE — Progress Notes (Signed)
   Covid-19 Vaccination Clinic  Name:  [name]    MRN: [id number] DOB: 16-Feb-1946  11/18/2019  [name] was observed post Covid-19 immunization for 30 minutes based on pre-vaccination screening without incidence. She was provided with Vaccine Information Sheet and instruction to access the V-Safe system.   [name] was instructed to call 911 with any severe reactions post vaccine: [name] Difficulty breathing  . Swelling of your face and throat  . A fast heartbeat  . A bad rash all over your body  . Dizziness and weakness    Immunizations Administered    Name Date Dose VIS Date Route   Pfizer COVID-19 Vaccine 11/18/2019 11:48 AM 0.3 mL 08/31/2019 Intramuscular   Manufacturer: [place]   Lot: [id number]   [place]: [id number]

## 2019-11-22 ENCOUNTER — Other Ambulatory Visit: Payer: Self-pay

## 2019-11-22 DIAGNOSIS — M48062 Spinal stenosis, lumbar region with neurogenic claudication: Secondary | ICD-10-CM | POA: Diagnosis not present

## 2019-11-22 MED ORDER — MELOXICAM 7.5 MG PO TABS: 15.0000 mg | ORAL_TABLET | Freq: Every day | ORAL | 1 refills | Status: DC

## 2019-11-22 MED ORDER — LEVOTHYROXINE SODIUM 75 MCG PO TABS: ORAL_TABLET | ORAL | 0 refills | Status: DC

## 2019-11-22 NOTE — Telephone Encounter (Signed)
RF request for Meloxicam LOV:09/27/19 Next ov: n/a Last written: 09/25/18(180,1) bid  Please advise, medication pending

## 2019-11-22 NOTE — Telephone Encounter (Signed)
meloxicam eRx'd

## 2019-11-23 ENCOUNTER — Other Ambulatory Visit (HOSPITAL_BASED_OUTPATIENT_CLINIC_OR_DEPARTMENT_OTHER): Payer: Self-pay | Admitting: Student

## 2019-11-23 DIAGNOSIS — M48062 Spinal stenosis, lumbar region with neurogenic claudication: Secondary | ICD-10-CM

## 2019-11-24 ENCOUNTER — Other Ambulatory Visit: Payer: Self-pay | Admitting: Family Medicine

## 2019-11-26 ENCOUNTER — Ambulatory Visit (HOSPITAL_BASED_OUTPATIENT_CLINIC_OR_DEPARTMENT_OTHER)
Admission: RE | Admit: 2019-11-26 | Discharge: 2019-11-26 | Disposition: A | Discharge status: Home / Self Care | Payer: Medicare Other | Source: Ambulatory Visit | Attending: Student | Admitting: Student

## 2019-11-26 ENCOUNTER — Other Ambulatory Visit: Payer: Self-pay

## 2019-11-26 DIAGNOSIS — M48062 Spinal stenosis, lumbar region with neurogenic claudication: Secondary | ICD-10-CM

## 2019-11-26 DIAGNOSIS — M48061 Spinal stenosis, lumbar region without neurogenic claudication: Secondary | ICD-10-CM | POA: Diagnosis not present

## 2019-11-26 MED ORDER — SIMVASTATIN 20 MG PO TABS: 20.0000 mg | ORAL_TABLET | Freq: Every day | ORAL | 0 refills | Status: DC

## 2019-12-05 DIAGNOSIS — M48062 Spinal stenosis, lumbar region with neurogenic claudication: Secondary | ICD-10-CM | POA: Diagnosis not present

## 2019-12-10 DIAGNOSIS — G894 Chronic pain syndrome: Secondary | ICD-10-CM | POA: Diagnosis not present

## 2019-12-10 DIAGNOSIS — Z79891 Long term (current) use of opiate analgesic: Secondary | ICD-10-CM | POA: Diagnosis not present

## 2019-12-14 DIAGNOSIS — N3941 Urge incontinence: Secondary | ICD-10-CM | POA: Diagnosis not present

## 2019-12-14 DIAGNOSIS — R109 Unspecified abdominal pain: Secondary | ICD-10-CM | POA: Diagnosis not present

## 2019-12-18 ENCOUNTER — Ambulatory Visit: Payer: Medicare Other | Attending: Internal Medicine

## 2019-12-18 DIAGNOSIS — Z23 Encounter for immunization: Secondary | ICD-10-CM

## 2019-12-18 NOTE — Progress Notes (Signed)
   Covid-19 Vaccination Clinic  Name:  [name]    MRN: [id number] DOB: 1946/04/16  12/18/2019  [name] was observed post Covid-19 immunization for 30 minutes based on pre-vaccination screening without incident. She was provided with Vaccine Information Sheet and instruction to access the V-Safe system.   [name] was instructed to call 911 with any severe reactions post vaccine: [name] Difficulty breathing  . Swelling of face and throat  . A fast heartbeat  . A bad rash all over body  . Dizziness and weakness   Immunizations Administered    Name Date Dose VIS Date Route   Pfizer COVID-19 Vaccine 12/18/2019  1:03 PM 0.3 mL 08/31/2019 Intramuscular   Manufacturer: [place]   Lot: [id number]   [place]: [id number]

## 2019-12-25 ENCOUNTER — Other Ambulatory Visit: Payer: Self-pay

## 2019-12-26 ENCOUNTER — Other Ambulatory Visit: Payer: Self-pay

## 2019-12-26 ENCOUNTER — Ambulatory Visit (INDEPENDENT_AMBULATORY_CARE_PROVIDER_SITE_OTHER): Payer: Medicare Other | Admitting: Family Medicine

## 2019-12-26 ENCOUNTER — Encounter: Payer: Self-pay | Admitting: Family Medicine

## 2019-12-26 VITALS — BP 112/72 | HR 64 | Temp 97.9°F | Resp 16 | Ht 65.0 in | Wt 181.6 lb

## 2019-12-26 DIAGNOSIS — M069 Rheumatoid arthritis, unspecified: Secondary | ICD-10-CM | POA: Diagnosis not present

## 2019-12-26 DIAGNOSIS — I10 Essential (primary) hypertension: Secondary | ICD-10-CM | POA: Diagnosis not present

## 2019-12-26 DIAGNOSIS — E7800 Pure hypercholesterolemia, unspecified: Secondary | ICD-10-CM

## 2019-12-26 DIAGNOSIS — E039 Hypothyroidism, unspecified: Secondary | ICD-10-CM | POA: Diagnosis not present

## 2019-12-26 DIAGNOSIS — M8949 Other hypertrophic osteoarthropathy, multiple sites: Secondary | ICD-10-CM

## 2019-12-26 DIAGNOSIS — M797 Fibromyalgia: Secondary | ICD-10-CM | POA: Diagnosis not present

## 2019-12-26 DIAGNOSIS — M159 Polyosteoarthritis, unspecified: Secondary | ICD-10-CM

## 2019-12-26 LAB — BASIC METABOLIC PANEL
BUN: 27 mg/dL — ABNORMAL HIGH (ref 6–23)
CO2: 26 mEq/L (ref 19–32)
Calcium: 9.1 mg/dL (ref 8.4–10.5)
Chloride: 105 mEq/L (ref 96–112)
Creatinine, Ser: 0.9 mg/dL (ref 0.40–1.20)
GFR: 61.36 mL/min (ref >60.00)
Glucose, Bld: 96 mg/dL (ref 70–99)
Potassium: 4.4 mEq/L (ref 3.5–5.1)
Sodium: 138 mEq/L (ref 135–145)

## 2019-12-26 LAB — SEDIMENTATION RATE: Sed Rate: 41 mm/hr — ABNORMAL HIGH (ref 0–30)

## 2019-12-26 LAB — TSH: TSH: 4.65 u[IU]/mL — ABNORMAL HIGH (ref 0.35–4.50)

## 2019-12-26 NOTE — Progress Notes (Signed)
OFFICE VISIT  12/26/2019   CC:  Chief Complaint  Patient presents with  . Follow-up    RCI, pt is not fasting    HPI:    Patient is a 74 y.o. Caucasian female who presents for scheduled f/u for chronic medical conditions->chronic fatigue, fibromyalgia syndrome, recurrent MDD/chronic anxiety, HTN, HLD, hypothyroidism.  Interim hx: She had f/u with [name] early Jan for MGUS and intermittent iron def anemia->all stable labs but iron saturation <20%.  Hb and the rest of her iron panel were normal. She got iron infusion 10/12/19.  Anxiety: alprazolam long term->uses every night to help anxiety related insomnia, daytime use for bad anxiety sporadically.  HTN: consistently 130/80 or better. HLD: taking simvastatin daily w/out problem. Diet: dropped Dr. Malachi Bonds but drinking lots of milk now. Hypothyroidism: takes properly every day.  "I'm just in a lot of pain".  +chronic fatigue.  Still seeing pain mgmt MD. Hands and feet arthritis making fingers and feet/toes swell lately, only brief morning stiffness.  No joint erythema.  Says she eventually will have to get her L knee replaced.  Recent re-trial of lyrica with Dr. Greta Doom resulted once again in itching and swelling.  Exercise: gets in the yard to work, cleans her house.    ROS: no fevers, no CP, no SOB, no wheezing, no cough, no rashes, no melena/hematochezia.  No polyuria or polydipsia.  Diffuse myalgias or arthralgias.  No focal weakness, paresthesias, or tremors.  No acute vision or hearing abnormalities. No n/v/d. Chronic, intermittent RLQ pains.  No palpitations.     Past Medical History:  Diagnosis Date  . Allergic rhinitis   . Anxiety   . Chest pain    cr  . Chronic gastritis    dx'd by EGD 2018 (erosive, likely NSAID-induced). 06/2019 +gastritis.  . Chronic pain syndrome    right knee osteo, hx of TKA in R knee.  Pt was told by [name] that no further surgical options are available for her; also, chronic lower back  pain.--[name].  [name] Chronic renal insufficiency, stage II (mild) 2015   vs acute fluctuations depending on hydration?---GFR from 60s up to 80s.  . Diverticulosis of colon    with hx of 'itis  . DJD (degenerative joint disease)    L spine and knees.  R knee inject 03/15/18, L knee inject 12/26/18 (by Dr. Lynann Bologna).  [name] Epigastric pain    As of 01/2017, GI (Dr. Loletha Carrow) planned EGD.  [name] FATIGUE / MALAISE 07/01/2009   Chronic  . GERD (gastroesophageal reflux disease)   . History of colonic polyps   . HTN (hypertension)   . Hypercholesterolemia    recommended restart of simvastatin 07/2018  . Hypothyroid 07/05/2012  . IBS (irritable bowel syndrome)   . IC (interstitial cystitis)    [name] at [facility]  . Iron deficiency anemia, unspecified 06/27/2013   Has required IV iron on multiple occasions ([name]).  Hemoccults neg 12/2016.  . Lumbar back pain    Spinal stenosis, lumbar region with neurogenic claudication L5-S1 sx's--ESI's no help.  [name] to do CT myelogram as of 07/2018.  . Major depression, recurrent ([place])    vs dysthymia,chronic  . Memory impairment 10/03/2012   Memory impairment 10/03/2012 >referral to neuro    . Monoclonal gammopathy of undetermined significance    IgM kappa MGUS ([name]) routine f/u has shown stability of this problem (most recent f/u 05/2019)  . Neuromuscular disorder ([place])   . OSA (  obstructive sleep apnea)    Latest sleep study 01/2017.  Some adjustments to CPAP being made+ dental referral for consideration of oral appliance --per pulm MD.  . Lebron Quam 04/2014; 03/2018   2015: T score -2.0 FRAX 11%/1.8%.  2019 FRAX 10%/1.5%.  . Otitis externa, candida 16/9678   [name] tx'd her with clotrimazole 1% external solution x 1 mo  . Palpitations   . Psoriasis   . Trochanteric bursitis of both hips    + injections by ortho in the past  . Vertigo 2020/21   6+ wks, ->to ENT->?vestib neuronitis with ongoing motion sensitivity,  gradually resolving.([name] 9/38/10)  . Vitamin D deficiency   . Xerostomia    and xerophthalmia--per [name] (he suggests SSA, SSB, ESR, rh factor, ANA, and antimicrosmal and antithyroglobulin ab's as of 02/17/16--all these labs were NORMAL.    Past Surgical History:  Procedure Laterality Date  . ABDOMINAL HYSTERECTOMY     for prolapse with abdominal incision  . BACK SURGERY  01/23/2015   Lumbar decompression with L2-L5 fusion ([name])  . CARDIAC CATHETERIZATION     no PCI  . CHOLECYSTECTOMY    . COLONOSCOPY  04/2006;07/16/19   Diverticulosis, o/w normal.  06/2019->2 adenomas, +Diverticulosis.  Recall 2023-2025.  [name] DEXA  04/06/2018   T score -1.5 femur neck, -2.2 radius.  FRAX 10%/1.5%.  Repeat DEXA 2 yrs.  . ESOPHAGOGASTRODUODENOSCOPY  05/29/2009; 03/01/17;07/16/19   Esoph normal.  Gastritis (H pylori NEG) + gastric polyps (benign).  Duodenal biopsy NORMAL.  2018-mild chronic gastritis, H pylori NEG. 06/2019 tortuous esophagus w/out stenosis +chronic gastritis H pyl neg.  [name] EYE SURGERY Left    "lazy eye"  . HAND SURGERY Right    TRIGGER FINGER; index finger  . HERNIA REPAIR     umbilical  . KNEE ARTHROSCOPY     RIGHT KNEE X 5  . LAMINOTOMY  2010   L-4, L-5 FUSION  . NASAL SINUS SURGERY     polypectomy (remote past)  . OOPHORECTOMY  2010   Laparoscopic BSO (path benign)  . PATELLA FRACTURE SURGERY  10-08   [name]  . POSTERIOR LAMINECTOMY / DECOMPRESSION LUMBAR SPINE  9-09   [name]  . TOTAL KNEE ARTHROPLASTY  12-07 and 4-07   [name], revision 12-09 [name] at [facility]    Outpatient Medications Prior to Visit  Medication Sig Dispense Refill  . acetaminophen (TYLENOL) 325 MG tablet Take 650 mg by mouth every 6 (six) hours as needed.    . ALPRAZolam (XANAX) 1 MG tablet TAKE 1 TO 2 TABLETS BY MOUTH TWICE DAILY AS NEEDED 120 tablet 1  . Azelastine HCl (ASTEPRO) 0.15 % SOLN 2 sprays each nostril every 12 hours 30 mL 11  . calcium carbonate (OSCAL) 1500 (600 Ca) MG  TABS tablet Take by mouth daily.    . Cholecalciferol (VITAMIN D3) 5000 units CAPS Take 4 capsules by mouth daily.     [name] doxylamine, Sleep, (SLEEP AID) 25 MG tablet Take 25 mg by mouth at bedtime as needed.    . hydrocortisone 2.5 % cream Apply 1 application topically as needed. RECTAL ITCHING    . levothyroxine (SYNTHROID) 75 MCG tablet TAKE 1/2 (ONE-HALF) TABLET BY MOUTH ONCE DAILY 45 tablet 0  . meclizine (ANTIVERT) 25 MG tablet Take 1 tablet (25 mg total) by mouth 3 (three) times daily as needed for dizziness. 45 tablet 0  . meloxicam (MOBIC) 7.5 MG tablet Take 2 tablets (15 mg total) by mouth daily.  180 tablet 1  . metoprolol tartrate (LOPRESSOR) 25 MG tablet Take 1 tablet by mouth once daily 90 tablet 1  . Multiple Vitamins-Iron (MULTIVITAMIN/IRON PO) Take 1 tablet by mouth daily.     [name] NIACIN PO Take by mouth daily.     [name] omeprazole (PRILOSEC) 40 MG capsule Take 1 capsule (40 mg total) by mouth daily. 30 capsule 3  . Oxycodone HCl 10 MG TABS 10 mg every 4 (four) hours as needed.     . promethazine (PHENERGAN) 25 MG tablet TAKE ONE TABLET BY MOUTH EVERY 6 HOURS AS NEEDED FOR NAUSEA AND VOMITING 90 tablet 0  . simvastatin (ZOCOR) 20 MG tablet Take 1 tablet (20 mg total) by mouth at bedtime. 30 tablet 0  . Specialty Vitamins Products (MAGNESIUM, AMINO ACID CHELATE,) 133 MG tablet Take 1 tablet by mouth daily.    [name] tolterodine (DETROL LA) 4 MG 24 hr capsule     . traZODone (DESYREL) 50 MG tablet Take 1 tablet by mouth at bedtime.  2  . methylPREDNISolone (MEDROL DOSEPAK) 4 MG TBPK tablet     . pregabalin (LYRICA) 75 MG capsule      No facility-administered medications prior to visit.    Allergies  Allergen Reactions  . Cymbalta [Duloxetine Hcl]     Facial edema  . Iohexol      Code: HIVES, Desc: pt states her face and throat swells when administered IV contrast - KB, Onset Date: 81275170   . Mepergan [Meperidine-Promethazine]     Cardiac arrest  . Morphine Rash  . Augmentin  [Amoxicillin-Pot Clavulanate] Nausea Only  . Citalopram Other (See Comments)    Jittery/heart racing   . Hctz [Hydrochlorothiazide] Other (See Comments)    Too much urinating, felt dried out, etc--NO ALLERGY  . Lexapro [Escitalopram Oxalate] Other (See Comments)    Jittery,heart racing  . Lyrica [Pregabalin] Swelling    Face, hands, fingers, ankles  . Valsartan     REACTION: pt states INTOL to Diovan    ROS As per HPI  PE: Blood pressure 112/72, pulse 64, temperature 97.9 F (36.6 C), temperature source Temporal, resp. rate 16, height _0  (1.651 m), weight 181 lb 9.6 oz (82.4 kg), SpO2 95 %. Gen: Alert, well appearing.  Patient is oriented to person, place, time, and situation. AFFECT: pleasant, lucid thought and speech. CV: RRR, no m/r/g.   LUNGS: CTA bilat, nonlabored resps, good aeration in all lung fields. EXT: no clubbing or cyanosis.  no edema.  Hands: no erythema or warmth.  Mild hypertrophy of MCPs and PIPs, mild TTP of all finger joints.  ROM intact.   LABS:  Lab Results  Component Value Date   TSH 3.98 12/14/2018   Lab Results  Component Value Date   WBC 6.8 10/04/2019   HGB 14.3 10/04/2019   HCT 43.5 10/04/2019   MCV 85.5 10/04/2019   PLT 215 10/04/2019   Lab Results  Component Value Date   CREATININE 1.11 (H) 10/04/2019   BUN 24 (H) 10/04/2019   NA 140 10/04/2019   K 4.0 10/04/2019   CL 103 10/04/2019   CO2 30 10/04/2019   Lab Results  Component Value Date   ALT 15 10/04/2019   AST 14 (L) 10/04/2019   ALKPHOS 77 10/04/2019   BILITOT 0.4 10/04/2019   Lab Results  Component Value Date   CHOL 226 (H) 07/24/2018   Lab Results  Component Value Date   HDL 30.50 (L) 07/24/2018   Lab Results  Component Value Date   LDLCALC 138 (H) 09/23/2014   Lab Results  Component Value Date   TRIG 289.0 (H) 07/24/2018   Lab Results  Component Value Date   CHOLHDL 7 07/24/2018   No results found for: HGBA1C  Lab Results  Component Value Date    ESRSEDRATE 31 (H) 12/14/2018    IMPRESSION AND PLAN:  1) HTN: The current medical regimen is effective;  continue present plan and medications. BMET today.  2) HLD: not fasting today, overdue for lipid monitoring. Tolerating simvastatin well.  She wants to wait until next f/u to check this and she will come fasting.  3) Fibromyalgia syndrome: not doing very well currently. We've exhausted all known med options for this.  She still gets periodic steroid bursts from her pain mgmt md but it's hard to tell how well these help.  4) Anxiety, hx of MDD: stable. No changes today. Updated CSC for alprazolam today.  She gets regular UDS's via pain mgmt MD office.  5) Polyarthralgia/osteoarthritis multiple joints: she asked for a ESR check today and I think that's reasonable. Hands and feet sx's c/w osteoarthritis.  6) Hypothyroidism: taking T4 correctly.  TSH check today.  An After Visit Summary was printed and given to the patient.  FOLLOW UP: Return in about 6 months (around 06/26/2020) for routine chronic illness f/u.  Signed:  Crissie Sickles, MD           12/26/2019

## 2019-12-30 ENCOUNTER — Other Ambulatory Visit: Payer: Self-pay | Admitting: Family Medicine

## 2019-12-31 NOTE — Telephone Encounter (Signed)
Requesting: alprazolam Contract: 12/26/19 UDS:07/31/18, [facility] Last Visit:12/26/19 Next Visit:06/26/20 Last Refill:08/06/19(120,1)  Please Advise. Medication pending

## 2020-01-02 ENCOUNTER — Encounter: Payer: Self-pay | Admitting: Family Medicine

## 2020-01-10 DIAGNOSIS — G894 Chronic pain syndrome: Secondary | ICD-10-CM | POA: Diagnosis not present

## 2020-01-10 DIAGNOSIS — M961 Postlaminectomy syndrome, not elsewhere classified: Secondary | ICD-10-CM | POA: Diagnosis not present

## 2020-01-10 DIAGNOSIS — F329 Major depressive disorder, single episode, unspecified: Secondary | ICD-10-CM | POA: Diagnosis not present

## 2020-01-10 DIAGNOSIS — K5900 Constipation, unspecified: Secondary | ICD-10-CM | POA: Diagnosis not present

## 2020-01-23 DIAGNOSIS — M1712 Unilateral primary osteoarthritis, left knee: Secondary | ICD-10-CM | POA: Diagnosis not present

## 2020-01-23 DIAGNOSIS — M25561 Pain in right knee: Secondary | ICD-10-CM | POA: Diagnosis not present

## 2020-01-23 DIAGNOSIS — E559 Vitamin D deficiency, unspecified: Secondary | ICD-10-CM | POA: Diagnosis not present

## 2020-01-31 ENCOUNTER — Other Ambulatory Visit: Payer: Self-pay | Admitting: Family Medicine

## 2020-02-02 ENCOUNTER — Other Ambulatory Visit: Payer: Self-pay | Admitting: Family Medicine

## 2020-02-11 DIAGNOSIS — F329 Major depressive disorder, single episode, unspecified: Secondary | ICD-10-CM | POA: Diagnosis not present

## 2020-02-11 DIAGNOSIS — K5900 Constipation, unspecified: Secondary | ICD-10-CM | POA: Diagnosis not present

## 2020-02-11 DIAGNOSIS — M961 Postlaminectomy syndrome, not elsewhere classified: Secondary | ICD-10-CM | POA: Diagnosis not present

## 2020-02-11 DIAGNOSIS — G894 Chronic pain syndrome: Secondary | ICD-10-CM | POA: Diagnosis not present

## 2020-02-19 DIAGNOSIS — N3946 Mixed incontinence: Secondary | ICD-10-CM | POA: Diagnosis not present

## 2020-02-19 DIAGNOSIS — N3941 Urge incontinence: Secondary | ICD-10-CM | POA: Diagnosis not present

## 2020-02-19 DIAGNOSIS — R350 Frequency of micturition: Secondary | ICD-10-CM | POA: Diagnosis not present

## 2020-02-29 DIAGNOSIS — N3941 Urge incontinence: Secondary | ICD-10-CM | POA: Diagnosis not present

## 2020-02-29 DIAGNOSIS — R350 Frequency of micturition: Secondary | ICD-10-CM | POA: Diagnosis not present

## 2020-03-04 ENCOUNTER — Telehealth: Payer: Self-pay | Admitting: Hematology & Oncology

## 2020-03-04 DIAGNOSIS — M2550 Pain in unspecified joint: Secondary | ICD-10-CM | POA: Diagnosis not present

## 2020-03-04 DIAGNOSIS — N3941 Urge incontinence: Secondary | ICD-10-CM | POA: Diagnosis not present

## 2020-03-04 NOTE — Telephone Encounter (Signed)
Ennever on PAL 7/15 updated letter/appt info mailed   

## 2020-04-01 DIAGNOSIS — M1712 Unilateral primary osteoarthritis, left knee: Secondary | ICD-10-CM | POA: Diagnosis not present

## 2020-04-03 ENCOUNTER — Ambulatory Visit: Payer: Medicare Other | Admitting: Hematology & Oncology

## 2020-04-03 ENCOUNTER — Other Ambulatory Visit: Payer: Medicare Other

## 2020-04-04 DIAGNOSIS — R350 Frequency of micturition: Secondary | ICD-10-CM | POA: Diagnosis not present

## 2020-04-04 DIAGNOSIS — N3941 Urge incontinence: Secondary | ICD-10-CM | POA: Diagnosis not present

## 2020-04-07 DIAGNOSIS — M545 Low back pain: Secondary | ICD-10-CM | POA: Diagnosis not present

## 2020-04-07 DIAGNOSIS — Z4789 Encounter for other orthopedic aftercare: Secondary | ICD-10-CM | POA: Diagnosis not present

## 2020-04-07 DIAGNOSIS — R262 Difficulty in walking, not elsewhere classified: Secondary | ICD-10-CM | POA: Diagnosis not present

## 2020-04-07 DIAGNOSIS — M25562 Pain in left knee: Secondary | ICD-10-CM | POA: Diagnosis not present

## 2020-04-08 DIAGNOSIS — M961 Postlaminectomy syndrome, not elsewhere classified: Secondary | ICD-10-CM | POA: Diagnosis not present

## 2020-04-08 DIAGNOSIS — F329 Major depressive disorder, single episode, unspecified: Secondary | ICD-10-CM | POA: Diagnosis not present

## 2020-04-08 DIAGNOSIS — M1712 Unilateral primary osteoarthritis, left knee: Secondary | ICD-10-CM | POA: Diagnosis not present

## 2020-04-08 DIAGNOSIS — G894 Chronic pain syndrome: Secondary | ICD-10-CM | POA: Diagnosis not present

## 2020-04-08 DIAGNOSIS — K5900 Constipation, unspecified: Secondary | ICD-10-CM | POA: Diagnosis not present

## 2020-04-09 ENCOUNTER — Other Ambulatory Visit: Payer: Self-pay

## 2020-04-09 ENCOUNTER — Inpatient Hospital Stay: Payer: Medicare Other | Attending: Hematology & Oncology

## 2020-04-09 ENCOUNTER — Encounter: Payer: Self-pay | Admitting: Hematology & Oncology

## 2020-04-09 ENCOUNTER — Inpatient Hospital Stay (HOSPITAL_BASED_OUTPATIENT_CLINIC_OR_DEPARTMENT_OTHER): Payer: Medicare Other | Admitting: Hematology & Oncology

## 2020-04-09 VITALS — BP 140/65 | HR 58 | Temp 98.3°F | Resp 16 | Wt 188.0 lb

## 2020-04-09 DIAGNOSIS — D500 Iron deficiency anemia secondary to blood loss (chronic): Secondary | ICD-10-CM

## 2020-04-09 DIAGNOSIS — D472 Monoclonal gammopathy: Secondary | ICD-10-CM | POA: Diagnosis not present

## 2020-04-09 DIAGNOSIS — K5900 Constipation, unspecified: Secondary | ICD-10-CM | POA: Insufficient documentation

## 2020-04-09 DIAGNOSIS — D509 Iron deficiency anemia, unspecified: Secondary | ICD-10-CM | POA: Insufficient documentation

## 2020-04-09 LAB — CBC WITH DIFFERENTIAL (CANCER CENTER ONLY)
Abs Immature Granulocytes: 0.02 10*3/uL (ref 0.00–0.07)
Basophils Absolute: 0.1 10*3/uL (ref 0.0–0.1)
Basophils Relative: 1 %
Eosinophils Absolute: 0.5 10*3/uL (ref 0.0–0.5)
Eosinophils Relative: 8 %
HCT: 40 % (ref 36.0–46.0)
Hemoglobin: 13.3 g/dL (ref 12.0–15.0)
Immature Granulocytes: 0 %
Lymphocytes Relative: 32 %
Lymphs Abs: 2.1 10*3/uL (ref 0.7–4.0)
MCH: 29.3 pg (ref 26.0–34.0)
MCHC: 33.3 g/dL (ref 30.0–36.0)
MCV: 88.1 fL (ref 80.0–100.0)
Monocytes Absolute: 0.5 10*3/uL (ref 0.1–1.0)
Monocytes Relative: 7 %
Neutro Abs: 3.5 10*3/uL (ref 1.7–7.7)
Neutrophils Relative %: 52 %
Platelet Count: 172 10*3/uL (ref 150–400)
RBC: 4.54 MIL/uL (ref 3.87–5.11)
RDW: 12.6 % (ref 11.5–15.5)
WBC Count: 6.6 10*3/uL (ref 4.0–10.5)
nRBC: 0 % (ref 0.0–0.2)

## 2020-04-09 LAB — CMP (CANCER CENTER ONLY)
ALT: 12 U/L (ref 0–44)
AST: 13 U/L — ABNORMAL LOW (ref 15–41)
Albumin: 4.2 g/dL (ref 3.5–5.0)
Alkaline Phosphatase: 73 U/L (ref 38–126)
Anion gap: 7 (ref 5–15)
BUN: 20 mg/dL (ref 8–23)
CO2: 27 mmol/L (ref 22–32)
Calcium: 9.6 mg/dL (ref 8.9–10.3)
Chloride: 105 mmol/L (ref 98–111)
Creatinine: 0.88 mg/dL (ref 0.44–1.00)
GFR, Est AFR Am: >60 mL/min (ref >60)
GFR, Estimated: >60 mL/min (ref >60)
Glucose, Bld: 112 mg/dL — ABNORMAL HIGH (ref 70–99)
Potassium: 3.8 mmol/L (ref 3.5–5.1)
Sodium: 139 mmol/L (ref 135–145)
Total Bilirubin: 0.7 mg/dL (ref 0.3–1.2)
Total Protein: 6.8 g/dL (ref 6.5–8.1)

## 2020-04-09 NOTE — Progress Notes (Signed)
Hematology and Oncology Follow Up Visit  [name] [id number] 21-Apr-1946 74 y.o. 10/05/2018   Principle Diagnosis:  1. IgM kappa monoclonal gammopathy of undetermined significance 2. Intermittent iron deficiency anemia  Current Therapy:   IV iron as indicated - last received in January 2021    Interim History:  [name] is here today for follow-up.  She is managing the coronavirus.  Unfortunately, she has to be at home.  Her husband is still drinking.  If she is not home, he likely will fall and break something.  I told her that probably would not be a bad idea because if he broke something, he would be in the hospital and then he would probably have to go to a nursing home or rehab facility.  She did get iron last time she was here.  Back in January, her ferritin was 153 with an iron saturation of 20%.  She usually responds well to iron.  As far as her IgM MGUS goes, she is holding steady.  When we last saw her, the M spike was 0.4 g/dL.  The IgM level was 630 mg/dL.  She has had no problem with bowels or bladder.  She is a little constipated.  I think she takes MiraLAX for this.  She has had no problems with headache.  There is no dizziness.  She has had some bladder issues.  She sees [facility].  They apparently are doing some kind of procedure with a TENS unit that hopefully will help with her incontinence.  Currently, I would say her performance status is ECOG 1.    Medications:  Allergies as of 10/05/2018      Reactions   Cymbalta [duloxetine Hcl]    Facial edema   Iohexol     Code: HIVES, Desc: pt states her face and throat swells when administered IV contrast - KB, Onset Date: [id number]   Mepergan [meperidine-promethazine]    Cardiac arrest   Morphine Rash   Augmentin [amoxicillin-pot Clavulanate] Nausea Only   Citalopram Other (See Comments)   Jittery/heart racing   Hctz [hydrochlorothiazide] Other (See Comments)   Too much urinating, felt dried out,  etc--NO ALLERGY   Lexapro [escitalopram Oxalate] Other (See Comments)   Jittery,heart racing   Lyrica [pregabalin] Swelling   Face, hands, fingers, ankles   Valsartan    REACTION: pt states INTOL to Diovan      Medication List       Accurate as of October 05, 2018 12:55 PM. Always use your most recent med list.        acetaminophen 325 MG tablet Commonly known as:  TYLENOL Take 650 mg by mouth every 6 (six) hours as needed.   ALIGN 4 MG Caps Take 1 capsule (4 mg total) by mouth daily.   ALPRAZolam 0.5 MG tablet Commonly known as:  XANAX TAKE ONE-HALF TO ONE TABLET BY MOUTH THREE TIMES DAILY   Azelastine HCl 0.15 % Soln Commonly known as:  ASTEPRO 2 sprays each nostril every 12 hours   calcium carbonate 1500 (600 Ca) MG Tabs tablet Commonly known as:  OSCAL Take by mouth daily.   dicyclomine 10 MG capsule Commonly known as:  BENTYL Take 1 capsule (10 mg total) by mouth 3 (three) times daily before meals.   hydrocortisone 2.5 % cream Apply 1 application topically as needed. RECTAL ITCHING   levothyroxine 75 MCG tablet Commonly known as:  SYNTHROID, LEVOTHROID TAKE 1/2 (ONE-HALF) TABLET BY MOUTH ONCE DAILY   magnesium (amino  acid chelate) 133 MG tablet Take 1 tablet by mouth daily.   meclizine 25 MG tablet Commonly known as:  ANTIVERT Take 1 tablet (25 mg total) by mouth every 6 (six) hours as needed for dizziness.   MELATONIN PO Take by mouth.   meloxicam 7.5 MG tablet Commonly known as:  MOBIC TAKE 2 TABLETS (15 MG TOTAL) BY MOUTH DAILY.   metoprolol tartrate 25 MG tablet Commonly known as:  LOPRESSOR Take 1 tablet (25 mg total) by mouth daily.   MULTIVITAMIN/IRON PO Take 1 tablet by mouth daily.   NIACIN PO Take by mouth.   omeprazole 40 MG capsule Commonly known as:  PRILOSEC Take 1 capsule (40 mg total) by mouth daily.   Oxycodone HCl 10 MG Tabs 10 mg every 4 (four) hours as needed. 1 every 6 hours for pain   promethazine 25 MG  tablet Commonly known as:  PHENERGAN TAKE ONE TABLET BY MOUTH EVERY 6 HOURS AS NEEDED FOR NAUSEA AND VOMITING   RED YEAST RICE PO Take by mouth.   simvastatin 20 MG tablet Commonly known as:  ZOCOR Take 1 tablet (20 mg total) by mouth at bedtime.   SLEEP AID 25 MG tablet Generic drug:  doxylamine (Sleep) Take 25 mg by mouth at bedtime as needed.   traZODone 50 MG tablet Commonly known as:  DESYREL Take 1 tablet by mouth at bedtime.   Vitamin D3 125 MCG (5000 UT) Caps Take 2 capsules by mouth daily.       Allergies:  Allergies  Allergen Reactions  . Cymbalta [Duloxetine Hcl]     Facial edema  . Iohexol      Code: HIVES, Desc: pt states her face and throat swells when administered IV contrast - KB, Onset Date: [id number]   . Mepergan [Meperidine-Promethazine]     Cardiac arrest  . Morphine Rash  . Augmentin [Amoxicillin-Pot Clavulanate] Nausea Only  . Citalopram Other (See Comments)    Jittery/heart racing   . Hctz [Hydrochlorothiazide] Other (See Comments)    Too much urinating, felt dried out, etc--NO ALLERGY  . Lexapro [Escitalopram Oxalate] Other (See Comments)    Jittery,heart racing  . Lyrica [Pregabalin] Swelling    Face, hands, fingers, ankles  . Valsartan     REACTION: pt states INTOL to Diovan    Past Medical History, Surgical history, Social history, and Family History were reviewed and updated.  Review of Systems: Review of Systems  Constitutional: Positive for malaise/fatigue.  Eyes: Positive for blurred vision.  Respiratory: Positive for wheezing.   Cardiovascular: Positive for palpitations.  Gastrointestinal: Positive for nausea.  Genitourinary: Positive for urgency.  Musculoskeletal: Positive for back pain, joint pain and myalgias.  Neurological: Positive for tingling.  Endo/Heme/Allergies: Negative.   Psychiatric/Behavioral: Negative.      Physical Exam:  weight is 174 lb (78.9 kg). Her oral temperature is 97.8 F (36.6 C). Her blood  pressure is 135/52 (abnormal) and her pulse is 52 (abnormal). Her respiration is 18 and oxygen saturation is 98%.   Wt Readings from Last 3 Encounters:  10/05/18 174 lb (78.9 kg)  09/05/18 178 lb (80.7 kg)  07/24/18 174 lb 8 oz (79.2 kg)    Physical Exam Vitals signs reviewed.  HENT:     Head: Normocephalic and atraumatic.  Eyes:     Pupils: Pupils are equal, round, and reactive to light.  Neck:     Musculoskeletal: Normal range of motion.  Cardiovascular:     Rate and Rhythm: Normal rate and  regular rhythm.     Heart sounds: Normal heart sounds.  Pulmonary:     Effort: Pulmonary effort is normal.     Breath sounds: Normal breath sounds.  Abdominal:     General: Bowel sounds are normal.     Palpations: Abdomen is soft.  Musculoskeletal: Normal range of motion.        General: No tenderness or deformity.  Lymphadenopathy:     Cervical: No cervical adenopathy.  Skin:    General: Skin is warm and dry.     Findings: No erythema or rash.  Neurological:     Mental Status: She is alert and oriented to person, place, and time.  Psychiatric:        Behavior: Behavior normal.        Thought Content: Thought content normal.        Judgment: Judgment normal.     Lab Results  Component Value Date   WBC 14.1 (H) 10/05/2018   HGB 14.8 10/05/2018   HCT 45.9 10/05/2018   MCV 91.4 10/05/2018   PLT 260 10/05/2018   Lab Results  Component Value Date   FERRITIN 143 04/06/2018   IRON 31 (L) 04/06/2018   TIBC 284 04/06/2018   UIBC 253 04/06/2018   IRONPCTSAT 11 (L) 04/06/2018   Lab Results  Component Value Date   RETICCTPCT 1.2 01/20/2011   RBC 5.02 10/05/2018   RETICCTABS 52.2 01/20/2011   Lab Results  Component Value Date   KPAFRELGTCHN 17.3 04/06/2018   LAMBDASER 16.6 04/06/2018   KAPLAMBRATIO 1.04 04/06/2018   Lab Results  Component Value Date   IGGSERUM 667 (L) 04/06/2018   IGA 287 04/06/2018   IGMSERUM 630 (H) 04/06/2018   Lab Results  Component Value Date    TOTALPROTELP 6.7 04/06/2018   ALBUMINELP 3.3 04/06/2018   A1GS 0.2 04/06/2018   A2GS 0.9 04/06/2018   BETS 1.0 04/06/2018   BETA2SER 0.5 06/19/2015   GAMS 1.2 04/06/2018   MSPIKE 0.5 (H) 04/06/2018   SPEI Comment 10/06/2017     Chemistry      Component Value Date/Time   NA 142 10/05/2018 1108   NA 138 03/25/2017 1333   NA 138 11/25/2016 1147   K 3.8 10/05/2018 1108   K 4.3 03/25/2017 1333   K 3.8 11/25/2016 1147   CL 103 10/05/2018 1108   CL 104 03/25/2017 1333   CL 103 01/15/2010 0835   CO2 29 10/05/2018 1108   CO2 24 03/25/2017 1333   CO2 25 11/25/2016 1147   BUN 12 10/05/2018 1108   BUN 12 03/25/2017 1333   BUN 15.3 11/25/2016 1147   CREATININE 0.93 10/05/2018 1108   CREATININE 0.76 03/25/2017 1333   CREATININE 0.8 11/25/2016 1147      Component Value Date/Time   CALCIUM 9.4 10/05/2018 1108   CALCIUM 9.5 03/25/2017 1333   CALCIUM 9.2 11/25/2016 1147   ALKPHOS 104 10/05/2018 1108   ALKPHOS 111 03/25/2017 1333   ALKPHOS 99 11/25/2016 1147   AST 11 (L) 10/05/2018 1108   AST 24 11/25/2016 1147   ALT 14 10/05/2018 1108   ALT 30 11/25/2016 1147   BILITOT 0.5 10/05/2018 1108   BILITOT 0.61 11/25/2016 1147      Impression and Plan: Ms. Tuff is a very pleasant 74 yo caucasian female with MGUS and history of iron deficiency.   I am disappointed about her husband.  She has had that he drinks 1/5 of liquor a day.  I told her that his  liver will fail him and this will put him out of his misery.  I just hate the fact that she has to be with him all the time.  At least, her blood is doing okay.  I would have to think that her iron levels should be okay.  We will plan to get her back in 6 more months.     [name], MD 1/16/202012:55 PM

## 2020-04-10 ENCOUNTER — Encounter: Payer: Self-pay | Admitting: *Deleted

## 2020-04-10 DIAGNOSIS — M545 Low back pain: Secondary | ICD-10-CM | POA: Diagnosis not present

## 2020-04-10 DIAGNOSIS — M25562 Pain in left knee: Secondary | ICD-10-CM | POA: Diagnosis not present

## 2020-04-10 DIAGNOSIS — Z4789 Encounter for other orthopedic aftercare: Secondary | ICD-10-CM | POA: Diagnosis not present

## 2020-04-10 DIAGNOSIS — R262 Difficulty in walking, not elsewhere classified: Secondary | ICD-10-CM | POA: Diagnosis not present

## 2020-04-10 LAB — IRON AND TIBC
Iron: 74 ug/dL (ref 41–142)
Saturation Ratios: 28 % (ref 21–57)
TIBC: 265 ug/dL (ref 236–444)
UIBC: 191 ug/dL (ref 120–384)

## 2020-04-10 LAB — IGG, IGA, IGM
IgA: 245 mg/dL (ref 64–422)
IgG (Immunoglobin G), Serum: 633 mg/dL (ref 586–1602)
IgM (Immunoglobulin M), Srm: 496 mg/dL — ABNORMAL HIGH (ref 26–217)

## 2020-04-10 LAB — KAPPA/LAMBDA LIGHT CHAINS
Kappa free light chain: 20.2 mg/L — ABNORMAL HIGH (ref 3.3–19.4)
Kappa, lambda light chain ratio: 1.11 (ref 0.26–1.65)
Lambda free light chains: 18.2 mg/L (ref 5.7–26.3)

## 2020-04-10 LAB — FERRITIN: Ferritin: 285 ng/mL (ref 11–307)

## 2020-04-14 DIAGNOSIS — M545 Low back pain: Secondary | ICD-10-CM | POA: Diagnosis not present

## 2020-04-14 DIAGNOSIS — R262 Difficulty in walking, not elsewhere classified: Secondary | ICD-10-CM | POA: Diagnosis not present

## 2020-04-14 DIAGNOSIS — M25562 Pain in left knee: Secondary | ICD-10-CM | POA: Diagnosis not present

## 2020-04-14 DIAGNOSIS — Z4789 Encounter for other orthopedic aftercare: Secondary | ICD-10-CM | POA: Diagnosis not present

## 2020-04-14 LAB — PROTEIN ELECTROPHORESIS, SERUM, WITH REFLEX
A/G Ratio: 1.2 (ref 0.7–1.7)
Albumin ELP: 3.5 g/dL (ref 2.9–4.4)
Alpha-1-Globulin: 0.2 g/dL (ref 0.0–0.4)
Alpha-2-Globulin: 0.8 g/dL (ref 0.4–1.0)
Beta Globulin: 1 g/dL (ref 0.7–1.3)
Gamma Globulin: 1 g/dL (ref 0.4–1.8)
Globulin, Total: 3 g/dL (ref 2.2–3.9)
M-Spike, %: 0.5 g/dL — ABNORMAL HIGH
SPEP Interpretation: 0
Total Protein ELP: 6.5 g/dL (ref 6.0–8.5)

## 2020-04-14 LAB — IMMUNOFIXATION REFLEX, SERUM
IgA: 261 mg/dL (ref 64–422)
IgG (Immunoglobin G), Serum: 641 mg/dL (ref 586–1602)
IgM (Immunoglobulin M), Srm: 507 mg/dL — ABNORMAL HIGH (ref 26–217)

## 2020-04-15 DIAGNOSIS — M1732 Unilateral post-traumatic osteoarthritis, left knee: Secondary | ICD-10-CM | POA: Diagnosis not present

## 2020-04-15 DIAGNOSIS — R350 Frequency of micturition: Secondary | ICD-10-CM | POA: Diagnosis not present

## 2020-04-16 DIAGNOSIS — R262 Difficulty in walking, not elsewhere classified: Secondary | ICD-10-CM | POA: Diagnosis not present

## 2020-04-16 DIAGNOSIS — M545 Low back pain: Secondary | ICD-10-CM | POA: Diagnosis not present

## 2020-04-16 DIAGNOSIS — M25562 Pain in left knee: Secondary | ICD-10-CM | POA: Diagnosis not present

## 2020-04-16 DIAGNOSIS — Z4789 Encounter for other orthopedic aftercare: Secondary | ICD-10-CM | POA: Diagnosis not present

## 2020-04-17 ENCOUNTER — Encounter: Payer: Self-pay | Admitting: Obstetrics and Gynecology

## 2020-04-17 ENCOUNTER — Other Ambulatory Visit: Payer: Self-pay

## 2020-04-17 ENCOUNTER — Ambulatory Visit (INDEPENDENT_AMBULATORY_CARE_PROVIDER_SITE_OTHER): Payer: Medicare Other | Admitting: Obstetrics and Gynecology

## 2020-04-17 VITALS — BP 124/82 | Ht 66.0 in | Wt 194.0 lb

## 2020-04-17 DIAGNOSIS — Z01419 Encounter for gynecological examination (general) (routine) without abnormal findings: Secondary | ICD-10-CM | POA: Diagnosis not present

## 2020-04-17 DIAGNOSIS — N952 Postmenopausal atrophic vaginitis: Secondary | ICD-10-CM | POA: Diagnosis not present

## 2020-04-17 NOTE — Progress Notes (Signed)
LENIYA BREIT [birth year] [id number]  SUBJECTIVE:  74 y.o. G0P0 female here for an annual routine gynecologic exam.  Only concern gynecologic  is she felt pain at the posterior introitus when pushing over the area during wiping.  She has no other gynecologic concerns.  Current Outpatient Medications  Medication Sig Dispense Refill  . acetaminophen (TYLENOL) 325 MG tablet Take 650 mg by mouth every 6 (six) hours as needed.    . ALPRAZolam (XANAX) 1 MG tablet TAKE 1 TO 2 TABLETS BY MOUTH TWICE DAILY AS NEEDED 120 tablet 1  . Azelastine HCl (ASTEPRO) 0.15 % SOLN 2 sprays each nostril every 12 hours 30 mL 11  . calcium carbonate (OSCAL) 1500 (600 Ca) MG TABS tablet Take by mouth daily.    . Cholecalciferol (VITAMIN D3) 5000 units CAPS Take 4 capsules by mouth daily.     [name] doxylamine, Sleep, (SLEEP AID) 25 MG tablet Take 25 mg by mouth at bedtime as needed.    . hydrocortisone 2.5 % cream Apply 1 application topically as needed. RECTAL ITCHING    . levothyroxine (SYNTHROID) 75 MCG tablet Take 1/2 (one-half) tablet by mouth once daily 45 tablet 1  . meclizine (ANTIVERT) 25 MG tablet Take 1 tablet (25 mg total) by mouth 3 (three) times daily as needed for dizziness. 45 tablet 0  . meloxicam (MOBIC) 7.5 MG tablet Take 2 tablets (15 mg total) by mouth daily. 180 tablet 1  . metoprolol tartrate (LOPRESSOR) 25 MG tablet Take 1 tablet by mouth once daily 90 tablet 1  . Multiple Vitamins-Iron (MULTIVITAMIN/IRON PO) Take 1 tablet by mouth daily.     [name] NIACIN PO Take by mouth daily.     [name] omeprazole (PRILOSEC) 40 MG capsule Take 1 capsule (40 mg total) by mouth daily. 30 capsule 3  . Oxycodone HCl 10 MG TABS 10 mg every 4 (four) hours as needed.     . promethazine (PHENERGAN) 25 MG tablet TAKE ONE TABLET BY MOUTH EVERY 6 HOURS AS NEEDED FOR NAUSEA AND VOMITING 90 tablet 0  . simvastatin (ZOCOR) 20 MG tablet TAKE 1 TABLET BY MOUTH AT BEDTIME 90 tablet 1  . Specialty Vitamins Products (MAGNESIUM, AMINO  ACID CHELATE,) 133 MG tablet Take 1 tablet by mouth daily.    [name] tolterodine (DETROL LA) 4 MG 24 hr capsule     . traZODone (DESYREL) 50 MG tablet Take 1 tablet by mouth at bedtime.  2   No current facility-administered medications for this visit.   Allergies: Cymbalta [duloxetine hcl], Iohexol, Mepergan [meperidine-promethazine], Morphine, Augmentin [amoxicillin-pot clavulanate], Citalopram, Hctz [hydrochlorothiazide], Lexapro [escitalopram oxalate], Lyrica [pregabalin], and Valsartan  No LMP recorded. Patient has had a hysterectomy.  Past medical history,surgical history, problem list, medications, allergies, family history and social history were all reviewed and documented as reviewed in the EPIC chart.  ROS:  Feeling well. No dyspnea or chest pain on exertion.  No abdominal pain, change in bowel habits, black or bloody stools.  No urinary tract symptoms. GYN ROS: no abnormal bleeding, pelvic pain or discharge, no breast pain or new or enlarging lumps on self exam.  No neurological complaints.   OBJECTIVE:  BP 124/82   Ht 5\' 6"  (1.676 m)   Wt 194 lb (88 kg)   BMI 31.31 kg/m  The patient appears well, alert, oriented x 3, in no distress. ENT normal.  Neck supple. No cervical or supraclavicular adenopathy or thyromegaly.  Lungs are clear, good air entry, no wheezes, rhonchi or rales.  S1 and S2 normal, no murmurs, regular rate and rhythm.  Abdomen soft without tenderness, guarding, mass or organomegaly.  Neurological is normal, no focal findings.  BREAST EXAM: breasts appear normal, no suspicious masses, no skin or nipple changes or axillary nodes  PELVIC EXAM: VULVA: normal appearing vulva with no masses, tenderness or lesions, VAGINA: Atrophic posterior introitus, no mass or abnormality, slight tenderness to touch over this area, normal appearing vagina with normal color and discharge, no lesions, CERVIX: surgically absent, UTERUS: surgically absent, vaginal cuff normal, ADNEXA: no  masses, nontender  Chaperone: Aurora Mask (DNP student) present during the examination and performed the pelvic exam with me in attendance to confirm the exam findings   ASSESSMENT:  74 y.o. G0P0 here for annual gynecologic exam  PLAN:   1. Postmenopausal. Prior TAH/BSO.  No vaginal bleeding.  No hot flashes or night sweats. 2. Pap smear 2011.  Significant history of abnormal Pap smears.  Is comfortable with not continuing screening based on age criteria according to the current guidelines. 3. Mammogram 05/2019.  Normal breast exam today.  She is reminded to schedule an annual mammogram this year when due. 4.  Atrophic vaginitis.  We discussed that low estrogen levels in menopause can cause atrophy of the vaginal tissues and can cause pain with digital palpation and/or intercourse.  Encourage use of lubricants, avoid irritation of the area, avoid over scrubbing/cleansing with soap.  Medical treatment would involve trial of vaginal estrogen cream which she is not interested in at this time. 5. Colonoscopy 2020.  Recommended that she follow up at the recommended interval.   6. Osteopenia.  DEXA 03/2018.  T-score -2.2 through Dr. Idelle Leech office where she will continue to follow-up in regards to bone health. 7. Health maintenance.  No labs today as she normally has these completed elsewhere.  Return annually or sooner, prn.  Joseph Pierini MD 04/17/20

## 2020-04-22 ENCOUNTER — Telehealth: Payer: Self-pay | Admitting: *Deleted

## 2020-04-22 NOTE — Telephone Encounter (Signed)
Message received from patient requesting return call regarding lab work.  Call placed back to patient and patient requesting IgG level.  IgG level and M spike results given to pt from 04/09/20.  Pt appreciative of call back and has no questions or concerns at this time.

## 2020-04-24 DIAGNOSIS — M25562 Pain in left knee: Secondary | ICD-10-CM | POA: Diagnosis not present

## 2020-04-24 DIAGNOSIS — R262 Difficulty in walking, not elsewhere classified: Secondary | ICD-10-CM | POA: Diagnosis not present

## 2020-04-24 DIAGNOSIS — M545 Low back pain: Secondary | ICD-10-CM | POA: Diagnosis not present

## 2020-04-24 DIAGNOSIS — Z4789 Encounter for other orthopedic aftercare: Secondary | ICD-10-CM | POA: Diagnosis not present

## 2020-04-29 DIAGNOSIS — N3941 Urge incontinence: Secondary | ICD-10-CM | POA: Diagnosis not present

## 2020-04-30 DIAGNOSIS — R262 Difficulty in walking, not elsewhere classified: Secondary | ICD-10-CM | POA: Diagnosis not present

## 2020-04-30 DIAGNOSIS — M545 Low back pain: Secondary | ICD-10-CM | POA: Diagnosis not present

## 2020-04-30 DIAGNOSIS — M25562 Pain in left knee: Secondary | ICD-10-CM | POA: Diagnosis not present

## 2020-04-30 DIAGNOSIS — Z4789 Encounter for other orthopedic aftercare: Secondary | ICD-10-CM | POA: Diagnosis not present

## 2020-05-02 DIAGNOSIS — M25562 Pain in left knee: Secondary | ICD-10-CM | POA: Diagnosis not present

## 2020-05-02 DIAGNOSIS — M545 Low back pain: Secondary | ICD-10-CM | POA: Diagnosis not present

## 2020-05-02 DIAGNOSIS — Z4789 Encounter for other orthopedic aftercare: Secondary | ICD-10-CM | POA: Diagnosis not present

## 2020-05-02 DIAGNOSIS — R262 Difficulty in walking, not elsewhere classified: Secondary | ICD-10-CM | POA: Diagnosis not present

## 2020-05-05 DIAGNOSIS — M545 Low back pain: Secondary | ICD-10-CM | POA: Diagnosis not present

## 2020-05-05 DIAGNOSIS — Z4789 Encounter for other orthopedic aftercare: Secondary | ICD-10-CM | POA: Diagnosis not present

## 2020-05-05 DIAGNOSIS — R262 Difficulty in walking, not elsewhere classified: Secondary | ICD-10-CM | POA: Diagnosis not present

## 2020-05-05 DIAGNOSIS — M25562 Pain in left knee: Secondary | ICD-10-CM | POA: Diagnosis not present

## 2020-05-06 DIAGNOSIS — N3941 Urge incontinence: Secondary | ICD-10-CM | POA: Diagnosis not present

## 2020-05-06 DIAGNOSIS — R350 Frequency of micturition: Secondary | ICD-10-CM | POA: Diagnosis not present

## 2020-05-08 DIAGNOSIS — R262 Difficulty in walking, not elsewhere classified: Secondary | ICD-10-CM | POA: Diagnosis not present

## 2020-05-08 DIAGNOSIS — M545 Low back pain: Secondary | ICD-10-CM | POA: Diagnosis not present

## 2020-05-08 DIAGNOSIS — M25562 Pain in left knee: Secondary | ICD-10-CM | POA: Diagnosis not present

## 2020-05-08 DIAGNOSIS — Z4789 Encounter for other orthopedic aftercare: Secondary | ICD-10-CM | POA: Diagnosis not present

## 2020-05-12 DIAGNOSIS — Z4789 Encounter for other orthopedic aftercare: Secondary | ICD-10-CM | POA: Diagnosis not present

## 2020-05-12 DIAGNOSIS — M545 Low back pain: Secondary | ICD-10-CM | POA: Diagnosis not present

## 2020-05-12 DIAGNOSIS — M25562 Pain in left knee: Secondary | ICD-10-CM | POA: Diagnosis not present

## 2020-05-12 DIAGNOSIS — R262 Difficulty in walking, not elsewhere classified: Secondary | ICD-10-CM | POA: Diagnosis not present

## 2020-05-13 DIAGNOSIS — N3941 Urge incontinence: Secondary | ICD-10-CM | POA: Diagnosis not present

## 2020-05-15 DIAGNOSIS — R262 Difficulty in walking, not elsewhere classified: Secondary | ICD-10-CM | POA: Diagnosis not present

## 2020-05-15 DIAGNOSIS — M545 Low back pain: Secondary | ICD-10-CM | POA: Diagnosis not present

## 2020-05-15 DIAGNOSIS — Z4789 Encounter for other orthopedic aftercare: Secondary | ICD-10-CM | POA: Diagnosis not present

## 2020-05-15 DIAGNOSIS — M25562 Pain in left knee: Secondary | ICD-10-CM | POA: Diagnosis not present

## 2020-05-19 DIAGNOSIS — M25562 Pain in left knee: Secondary | ICD-10-CM | POA: Diagnosis not present

## 2020-05-19 DIAGNOSIS — Z4789 Encounter for other orthopedic aftercare: Secondary | ICD-10-CM | POA: Diagnosis not present

## 2020-05-19 DIAGNOSIS — R262 Difficulty in walking, not elsewhere classified: Secondary | ICD-10-CM | POA: Diagnosis not present

## 2020-05-19 DIAGNOSIS — M545 Low back pain: Secondary | ICD-10-CM | POA: Diagnosis not present

## 2020-05-22 DIAGNOSIS — Z4789 Encounter for other orthopedic aftercare: Secondary | ICD-10-CM | POA: Diagnosis not present

## 2020-05-22 DIAGNOSIS — M25562 Pain in left knee: Secondary | ICD-10-CM | POA: Diagnosis not present

## 2020-05-22 DIAGNOSIS — M545 Low back pain: Secondary | ICD-10-CM | POA: Diagnosis not present

## 2020-05-22 DIAGNOSIS — R262 Difficulty in walking, not elsewhere classified: Secondary | ICD-10-CM | POA: Diagnosis not present

## 2020-05-23 ENCOUNTER — Other Ambulatory Visit: Payer: Self-pay | Admitting: Family Medicine

## 2020-05-27 DIAGNOSIS — R350 Frequency of micturition: Secondary | ICD-10-CM | POA: Diagnosis not present

## 2020-05-27 DIAGNOSIS — N3941 Urge incontinence: Secondary | ICD-10-CM | POA: Diagnosis not present

## 2020-05-27 NOTE — Telephone Encounter (Signed)
OK, alpraz eRx'd

## 2020-05-27 NOTE — Telephone Encounter (Signed)
RF request for alprazolam Last RX 12/31/19 # 120 x 1 rf. Last OV 12/26/19 Next OV 06/26/20 Please advise.

## 2020-05-28 DIAGNOSIS — R262 Difficulty in walking, not elsewhere classified: Secondary | ICD-10-CM | POA: Diagnosis not present

## 2020-05-28 DIAGNOSIS — M545 Low back pain: Secondary | ICD-10-CM | POA: Diagnosis not present

## 2020-05-28 DIAGNOSIS — M25562 Pain in left knee: Secondary | ICD-10-CM | POA: Diagnosis not present

## 2020-05-28 DIAGNOSIS — Z4789 Encounter for other orthopedic aftercare: Secondary | ICD-10-CM | POA: Diagnosis not present

## 2020-06-03 DIAGNOSIS — R262 Difficulty in walking, not elsewhere classified: Secondary | ICD-10-CM | POA: Diagnosis not present

## 2020-06-03 DIAGNOSIS — M25562 Pain in left knee: Secondary | ICD-10-CM | POA: Diagnosis not present

## 2020-06-03 DIAGNOSIS — M545 Low back pain: Secondary | ICD-10-CM | POA: Diagnosis not present

## 2020-06-03 DIAGNOSIS — Z4789 Encounter for other orthopedic aftercare: Secondary | ICD-10-CM | POA: Diagnosis not present

## 2020-06-04 DIAGNOSIS — F329 Major depressive disorder, single episode, unspecified: Secondary | ICD-10-CM | POA: Diagnosis not present

## 2020-06-04 DIAGNOSIS — M961 Postlaminectomy syndrome, not elsewhere classified: Secondary | ICD-10-CM | POA: Diagnosis not present

## 2020-06-04 DIAGNOSIS — G894 Chronic pain syndrome: Secondary | ICD-10-CM | POA: Diagnosis not present

## 2020-06-04 DIAGNOSIS — K5900 Constipation, unspecified: Secondary | ICD-10-CM | POA: Diagnosis not present

## 2020-06-06 DIAGNOSIS — M25562 Pain in left knee: Secondary | ICD-10-CM | POA: Diagnosis not present

## 2020-06-06 DIAGNOSIS — Z4789 Encounter for other orthopedic aftercare: Secondary | ICD-10-CM | POA: Diagnosis not present

## 2020-06-06 DIAGNOSIS — R262 Difficulty in walking, not elsewhere classified: Secondary | ICD-10-CM | POA: Diagnosis not present

## 2020-06-06 DIAGNOSIS — M545 Low back pain: Secondary | ICD-10-CM | POA: Diagnosis not present

## 2020-06-11 DIAGNOSIS — M545 Low back pain: Secondary | ICD-10-CM | POA: Diagnosis not present

## 2020-06-11 DIAGNOSIS — Z4789 Encounter for other orthopedic aftercare: Secondary | ICD-10-CM | POA: Diagnosis not present

## 2020-06-11 DIAGNOSIS — R262 Difficulty in walking, not elsewhere classified: Secondary | ICD-10-CM | POA: Diagnosis not present

## 2020-06-11 DIAGNOSIS — M25562 Pain in left knee: Secondary | ICD-10-CM | POA: Diagnosis not present

## 2020-06-18 DIAGNOSIS — M545 Low back pain: Secondary | ICD-10-CM | POA: Diagnosis not present

## 2020-06-18 DIAGNOSIS — R262 Difficulty in walking, not elsewhere classified: Secondary | ICD-10-CM | POA: Diagnosis not present

## 2020-06-18 DIAGNOSIS — M25562 Pain in left knee: Secondary | ICD-10-CM | POA: Diagnosis not present

## 2020-06-18 DIAGNOSIS — Z4789 Encounter for other orthopedic aftercare: Secondary | ICD-10-CM | POA: Diagnosis not present

## 2020-06-24 DIAGNOSIS — Z4789 Encounter for other orthopedic aftercare: Secondary | ICD-10-CM | POA: Diagnosis not present

## 2020-06-24 DIAGNOSIS — R262 Difficulty in walking, not elsewhere classified: Secondary | ICD-10-CM | POA: Diagnosis not present

## 2020-06-24 DIAGNOSIS — M545 Low back pain, unspecified: Secondary | ICD-10-CM | POA: Diagnosis not present

## 2020-06-24 DIAGNOSIS — M25562 Pain in left knee: Secondary | ICD-10-CM | POA: Diagnosis not present

## 2020-06-25 ENCOUNTER — Other Ambulatory Visit: Payer: Self-pay

## 2020-06-26 ENCOUNTER — Encounter: Payer: Self-pay | Admitting: Family Medicine

## 2020-06-26 ENCOUNTER — Ambulatory Visit (INDEPENDENT_AMBULATORY_CARE_PROVIDER_SITE_OTHER): Payer: Medicare Other | Admitting: Family Medicine

## 2020-06-26 ENCOUNTER — Other Ambulatory Visit: Payer: Self-pay

## 2020-06-26 VITALS — BP 127/72 | HR 69 | Temp 97.4°F | Resp 16 | Wt 191.6 lb

## 2020-06-26 DIAGNOSIS — R110 Nausea: Secondary | ICD-10-CM

## 2020-06-26 DIAGNOSIS — E782 Mixed hyperlipidemia: Secondary | ICD-10-CM

## 2020-06-26 DIAGNOSIS — I10 Essential (primary) hypertension: Secondary | ICD-10-CM

## 2020-06-26 DIAGNOSIS — F411 Generalized anxiety disorder: Secondary | ICD-10-CM

## 2020-06-26 DIAGNOSIS — E039 Hypothyroidism, unspecified: Secondary | ICD-10-CM

## 2020-06-26 LAB — LIPID PANEL
Cholesterol: 185 mg/dL (ref 0–200)
HDL: 31.5 mg/dL — ABNORMAL LOW (ref >39.00)
NonHDL: 153.4
Total CHOL/HDL Ratio: 6
Triglycerides: 332 mg/dL — ABNORMAL HIGH (ref 0.0–149.0)
VLDL: 66.4 mg/dL — ABNORMAL HIGH (ref 0.0–40.0)

## 2020-06-26 LAB — T4, FREE: Free T4: 0.83 ng/dL (ref 0.60–1.60)

## 2020-06-26 LAB — LDL CHOLESTEROL, DIRECT: Direct LDL: 105 mg/dL

## 2020-06-26 LAB — TSH: TSH: 9.21 u[IU]/mL — ABNORMAL HIGH (ref 0.35–4.50)

## 2020-06-26 MED ORDER — PROMETHAZINE HCL 25 MG PO TABS: ORAL_TABLET | ORAL | 1 refills | Status: DC

## 2020-06-26 NOTE — Progress Notes (Signed)
OFFICE VISIT  06/26/2020  CC:  Chief Complaint  Patient presents with  . Follow-up    RCI   HPI:    Patient is a 74 y.o. Caucasian female who presents for 6 mo f/u chronic fatigue, fibromyalgia syndrome, recurrent MDD/chronic anxiety, HTN, HLD, hypothyroidism. A/P as of last visit: "1) HTN: The current medical regimen is effective;  continue present plan and medications. BMET today.  2) HLD: not fasting today, overdue for lipid monitoring. Tolerating simvastatin well.  She wants to wait until next f/u to check this and she will come fasting.  3) Fibromyalgia syndrome: not doing very well currently. We've exhausted all known med options for this.  She still gets periodic steroid bursts from her pain mgmt md but it's hard to tell how well these help.  4) Anxiety, hx of MDD: stable. No changes today. Updated CSC for alprazolam today.  She gets regular UDS's via pain mgmt MD office.  5) Polyarthralgia/osteoarthritis multiple joints: she asked for a ESR check today and I think that's reasonable. Hands and feet sx's c/w osteoarthritis.  6) Hypothyroidism: taking T4 correctly.  TSH check today."  INTERIM HX: Doing about the same. Has been going to PT for LBP/DDD for a few months, has a few more sessions. Has some good days and some bad days but is always suffering from her pain.  03/2020 GYN exam: no pap required.  No new treatments. 03/2020 f/u with [name]: all stable, no new treatments.  Labs were done and were stable.  Anxiety level has been pretty stable.  Takes 1 xanax daily, some days 2 tabs.  Sister Kalman Shan had a stroke.  Chai not interested in any additional anxiety med such as an antidepressant.  PMP AWARE reviewed today: most recent rx for alprazolam was filled 06/06/20, # 120, rx by me.  Her oxycodone is managed by Dr. Greta Doom with pain mgmt. No red flags.  Deals with recurrent bouts of nausea, phenergan has helped well and she requests RF to have this  around.  Takes T4 on empty stomach along with her bp meds.    Home bp monitoring: avg <130/80. Compliant with metoprolol.  Compliant with statin. Tries to eat pretty healthy.   Past Medical History:  Diagnosis Date  . Allergic rhinitis   . Anxiety   . Chest pain    cr  . Chronic gastritis    dx'd by EGD 2018 (erosive, likely NSAID-induced). 06/2019 +gastritis.  . Chronic pain syndrome    right knee osteo, hx of TKA in R knee.  Pt was told by [name] that no further surgical options are available for her; also, chronic lower back pain.--[name].  [name] Chronic renal insufficiency, stage II (mild) 2015   vs acute fluctuations depending on hydration?---GFR from 60s up to 80s.  . Diverticulosis of colon    with hx of 'itis  . DJD (degenerative joint disease)    L spine and knees.  R knee inject 03/15/18, L knee inject 12/26/18 (by Dr. Lynann Bologna).  [name] Epigastric pain    As of 01/2017, GI (Dr. Loletha Carrow) planned EGD.  [name] FATIGUE / MALAISE 07/01/2009   Chronic  . GERD (gastroesophageal reflux disease)   . History of colonic polyps   . HTN (hypertension)   . Hypercholesterolemia    recommended restart of simvastatin 07/2018  . Hypothyroid 07/05/2012  . IBS (irritable bowel syndrome)   . IC (interstitial cystitis)    [name] at [place]  . Iron  deficiency anemia, unspecified 06/27/2013   Has required IV iron on multiple occasions ([name]).  Hemoccults neg 12/2016.  . Lumbar back pain    Spinal stenosis, lumbar region with neurogenic claudication L5-S1 sx's--ESI's no help.  CT L spine stable post-surg appearance as of 12/2019  . Major depression, recurrent ([place])    vs dysthymia,chronic  . Memory impairment 10/03/2012   Memory impairment 10/03/2012 >referral to neuro    . Monoclonal gammopathy of undetermined significance    IgM kappa MGUS ([name]) routine f/u has shown stability of this problem (most recent f/u 05/2019)  . Neuromuscular disorder ([place])   . OSA  (obstructive sleep apnea)    Latest sleep study 01/2017.  Some adjustments to CPAP being made+ dental referral for consideration of oral appliance --per pulm MD.  . Lebron Quam 04/2014; 03/2018   2015: T score -2.0 FRAX 11%/1.8%.  2019 FRAX 10%/1.5%.  . Otitis externa, candida 94/1740   [name] tx'd her with clotrimazole 1% external solution x 1 mo  . Palpitations   . Psoriasis   . Trochanteric bursitis of both hips    + injections by ortho in the past  . Vertigo 2020/21   6+ wks, ->to ENT->?vestib neuronitis with ongoing motion sensitivity, gradually resolving.([name] [birth year])  . Vitamin D deficiency   . Xerostomia    and xerophthalmia--per [name] (he suggests SSA, SSB, ESR, rh factor, ANA, and antimicrosmal and antithyroglobulin ab's as of 02/17/16--all these labs were NORMAL.    Past Surgical History:  Procedure Laterality Date  . ABDOMINAL HYSTERECTOMY     for prolapse with abdominal incision  . BACK SURGERY  01/23/2015   Lumbar decompression with L2-L5 fusion ([name])  . CARDIAC CATHETERIZATION     no PCI  . CHOLECYSTECTOMY    . COLONOSCOPY  04/2006;07/16/19   Diverticulosis, o/w normal.  06/2019->2 adenomas, +Diverticulosis.  Recall 2023-2025.  [name] DEXA  04/06/2018   T score -1.5 femur neck, -2.2 radius.  FRAX 10%/1.5%.  Repeat DEXA 2 yrs.  . ESOPHAGOGASTRODUODENOSCOPY  05/29/2009; 03/01/17;07/16/19   Esoph normal.  Gastritis (H pylori NEG) + gastric polyps (benign).  Duodenal biopsy NORMAL.  2018-mild chronic gastritis, H pylori NEG. 06/2019 tortuous esophagus w/out stenosis +chronic gastritis H pyl neg.  [name] EYE SURGERY Left    "lazy eye"  . HAND SURGERY Right    TRIGGER FINGER; index finger  . HERNIA REPAIR     umbilical  . KNEE ARTHROSCOPY     RIGHT KNEE X 5  . LAMINOTOMY  2010   L-4, L-5 FUSION  . NASAL SINUS SURGERY     polypectomy (remote past)  . OOPHORECTOMY  2010   Laparoscopic BSO (path benign)  . PATELLA FRACTURE SURGERY  10-08   [name]  .  POSTERIOR LAMINECTOMY / DECOMPRESSION LUMBAR SPINE  9-09   [name]  . TOTAL KNEE ARTHROPLASTY  12-07 and 4-07   [name], revision 12-09 [name] at [facility]    Outpatient Medications Prior to Visit  Medication Sig Dispense Refill  . acetaminophen (TYLENOL) 325 MG tablet Take 650 mg by mouth every 6 (six) hours as needed.    . ALPRAZolam (XANAX) 1 MG tablet TAKE 1 TO 2 TABLETS BY MOUTH TWICE DAILY AS NEEDED 120 tablet 1  . Azelastine HCl (ASTEPRO) 0.15 % SOLN 2 sprays each nostril every 12 hours 30 mL 11  . calcium carbonate (OSCAL) 1500 (600 Ca) MG TABS tablet Take by mouth daily.    . Cholecalciferol (  VITAMIN D3) 5000 units CAPS Take 4 capsules by mouth daily.     [name] doxylamine, Sleep, (SLEEP AID) 25 MG tablet Take 25 mg by mouth at bedtime as needed.    . hydrocortisone 2.5 % cream Apply 1 application topically as needed. RECTAL ITCHING    . levothyroxine (SYNTHROID) 75 MCG tablet Take 1/2 (one-half) tablet by mouth once daily 45 tablet 1  . meclizine (ANTIVERT) 25 MG tablet Take 1 tablet (25 mg total) by mouth 3 (three) times daily as needed for dizziness. 45 tablet 0  . meloxicam (MOBIC) 7.5 MG tablet Take 2 tablets (15 mg total) by mouth daily. 180 tablet 1  . metoprolol tartrate (LOPRESSOR) 25 MG tablet Take 1 tablet by mouth once daily 90 tablet 1  . Multiple Vitamins-Iron (MULTIVITAMIN/IRON PO) Take 1 tablet by mouth daily.     [name] NIACIN PO Take by mouth daily.     [name] omeprazole (PRILOSEC) 40 MG capsule Take 1 capsule (40 mg total) by mouth daily. 30 capsule 3  . Oxycodone HCl 10 MG TABS 10 mg every 4 (four) hours as needed.     . simvastatin (ZOCOR) 20 MG tablet TAKE 1 TABLET BY MOUTH AT BEDTIME 90 tablet 1  . Specialty Vitamins Products (MAGNESIUM, AMINO ACID CHELATE,) 133 MG tablet Take 1 tablet by mouth daily.    [name] tolterodine (DETROL LA) 4 MG 24 hr capsule     . traZODone (DESYREL) 50 MG tablet Take 1 tablet by mouth at bedtime.  2  . promethazine (PHENERGAN) 25 MG tablet TAKE ONE  TABLET BY MOUTH EVERY 6 HOURS AS NEEDED FOR NAUSEA AND VOMITING 90 tablet 0   No facility-administered medications prior to visit.    Allergies  Allergen Reactions  . Cymbalta [Duloxetine Hcl]     Facial edema  . Iohexol      Code: HIVES, Desc: pt states her face and throat swells when administered IV contrast - KB, Onset Date: [id number]   . Mepergan [Meperidine-Promethazine]     Cardiac arrest  . Morphine Rash  . Augmentin [Amoxicillin-Pot Clavulanate] Nausea Only  . Citalopram Other (See Comments)    Jittery/heart racing   . Hctz [Hydrochlorothiazide] Other (See Comments)    Too much urinating, felt dried out, etc--NO ALLERGY  . Lexapro [Escitalopram Oxalate] Other (See Comments)    Jittery,heart racing  . Lyrica [Pregabalin] Swelling    Face, hands, fingers, ankles  . Valsartan     REACTION: pt states INTOL to Diovan    ROS As per HPI  PE: Vitals with BMI 06/26/2020 04/17/2020 04/09/2020  Height - 5' 6" -  Weight 191 lbs 10 oz 194 lbs 188 lbs  BMI - 39.76 -  Systolic 734 193 790  Diastolic 72 82 65  Pulse 69 - 58     Gen: Alert, well appearing.  Patient is oriented to person, place, time, and situation. AFFECT: pleasant, lucid thought and speech. CV: RRR, no m/r/g.   LUNGS: CTA bilat, nonlabored resps, good aeration in all lung fields. EXT: no clubbing or cyanosis.  no edema.    LABS:  Lab Results  Component Value Date   TSH 4.65 (H) 12/26/2019   T3TOTAL 117.5 06/04/2015    Lab Results  Component Value Date   WBC 6.6 04/09/2020   HGB 13.3 04/09/2020   HCT 40.0 04/09/2020   MCV 88.1 04/09/2020   PLT 172 04/09/2020   Lab Results  Component Value Date   CREATININE 0.88 04/09/2020   BUN  20 04/09/2020   NA 139 04/09/2020   K 3.8 04/09/2020   CL 105 04/09/2020   CO2 27 04/09/2020   Lab Results  Component Value Date   ALT 12 04/09/2020   AST 13 (L) 04/09/2020   ALKPHOS 73 04/09/2020   BILITOT 0.7 04/09/2020   Lab Results  Component Value Date    CHOL 226 (H) 07/24/2018   Lab Results  Component Value Date   HDL 30.50 (L) 07/24/2018   Lab Results  Component Value Date   LDLCALC 138 (H) 09/23/2014   Lab Results  Component Value Date   TRIG 289.0 (H) 07/24/2018   Lab Results  Component Value Date   CHOLHDL 7 07/24/2018   Lab Results  Component Value Date   ESRSEDRATE 41 (H) 12/26/2019   IMPRESSION AND PLAN:  1) Chronic anxiety: she is stable and will continue alprazolam regularly. No new rx needed for this med today. CSC UTD.  2) Hypothyroidism: monitor TSH, T4, T3 today. Continue 1/2 75 mcg tab qd.  3) HLD: tolerating simvastatin 71m-->continue this, and we'll check FLP today. Recent hepatic panel 03/2020 via Dr. EMarin Olpwas normal.  4) HTN: continue lopressor 254mqd. 03/2020 renal function and lytes normal via Dr. EnMarin Olp 5) Chronic pain: multifactorial. Continue mgmt via her pain specialist, Dr. BoGreta Doom 6) Recurrent nausea: likely anxiety, GERD, and polypharmacy related. Will RF phenergan that she uses prn.   She declines flu vaccine today. Covid vaccine is UTD.  An After Visit Summary was printed and given to the patient.  FOLLOW UP: Return in about 6 months (around 12/25/2020) for routine chronic illness f/u.  Signed:  PhCrissie SicklesMD           06/26/2020

## 2020-06-27 DIAGNOSIS — N3941 Urge incontinence: Secondary | ICD-10-CM | POA: Diagnosis not present

## 2020-06-27 DIAGNOSIS — R350 Frequency of micturition: Secondary | ICD-10-CM | POA: Diagnosis not present

## 2020-06-27 LAB — T3: T3, Total: 137 ng/dL (ref 76–181)

## 2020-06-30 ENCOUNTER — Telehealth: Payer: Self-pay

## 2020-06-30 DIAGNOSIS — E039 Hypothyroidism, unspecified: Secondary | ICD-10-CM

## 2020-06-30 NOTE — Telephone Encounter (Signed)
Please advise on referral request below.

## 2020-06-30 NOTE — Telephone Encounter (Signed)
Patient needs to schedule an appt with Endo doctor - Dr. Renato Shin.  She hasnt seen him since 2017.  His office is requiring another referral.  Patient can be reached at [phone].

## 2020-06-30 NOTE — Telephone Encounter (Signed)
OK, referral ordered. Also, see the result note I did for her today and sent to "team Caly Pellum".-thx

## 2020-07-01 DIAGNOSIS — Z4789 Encounter for other orthopedic aftercare: Secondary | ICD-10-CM | POA: Diagnosis not present

## 2020-07-01 DIAGNOSIS — M25562 Pain in left knee: Secondary | ICD-10-CM | POA: Diagnosis not present

## 2020-07-01 DIAGNOSIS — M545 Low back pain, unspecified: Secondary | ICD-10-CM | POA: Diagnosis not present

## 2020-07-01 DIAGNOSIS — R262 Difficulty in walking, not elsewhere classified: Secondary | ICD-10-CM | POA: Diagnosis not present

## 2020-07-01 NOTE — Telephone Encounter (Signed)
Spoke with pt regarding labs and instructions. Pt would like referral to Endo. Labs placed.

## 2020-07-01 NOTE — Telephone Encounter (Signed)
-----   Message from [name], MD sent at 06/30/2020  5:00 PM EDT ----- Labs showed thyroid a little low.  Take a whole 75 mcg tab on mondays and fridays, continue 1/2 tab daily all other days of the week. Plan lab visit for recheck TSH in 6-8 wks, although she may prefer to continue follow up of this problem with Dr. Donald Pore will order referral to him now as per her request. Cholesterol numbers showed triglycerides up some compared to her usual. No med change recommended.  Minimize carbohydrates and fats.  Increase activity as much as possible.-thx

## 2020-07-04 DIAGNOSIS — G894 Chronic pain syndrome: Secondary | ICD-10-CM | POA: Diagnosis not present

## 2020-07-04 DIAGNOSIS — M961 Postlaminectomy syndrome, not elsewhere classified: Secondary | ICD-10-CM | POA: Diagnosis not present

## 2020-07-04 DIAGNOSIS — K5900 Constipation, unspecified: Secondary | ICD-10-CM | POA: Diagnosis not present

## 2020-07-04 DIAGNOSIS — F329 Major depressive disorder, single episode, unspecified: Secondary | ICD-10-CM | POA: Diagnosis not present

## 2020-07-07 ENCOUNTER — Ambulatory Visit (INDEPENDENT_AMBULATORY_CARE_PROVIDER_SITE_OTHER): Payer: Medicare Other | Admitting: Endocrinology

## 2020-07-07 ENCOUNTER — Other Ambulatory Visit: Payer: Self-pay

## 2020-07-07 ENCOUNTER — Encounter: Payer: Self-pay | Admitting: Endocrinology

## 2020-07-07 VITALS — BP 132/82 | HR 61 | Ht 66.0 in | Wt 189.0 lb

## 2020-07-07 DIAGNOSIS — E039 Hypothyroidism, unspecified: Secondary | ICD-10-CM

## 2020-07-07 DIAGNOSIS — R232 Flushing: Secondary | ICD-10-CM

## 2020-07-07 MED ORDER — LEVOTHYROXINE SODIUM 50 MCG PO TABS: 50.0000 ug | ORAL_TABLET | Freq: Every day | ORAL | 3 refills | Status: DC

## 2020-07-07 NOTE — Progress Notes (Signed)
Subjective:    Patient ID: [name], female    DOB: [birth year], 74 y.o.   MRN: [id number]  HPI Pt is referred by Dr [name], for hypothyroidism.  Pt reports hypothyroidism was dx'ed in 2010.  she has been on prescribed thyroid hormone therapy since dx.  she has never taken kelp or any other type of non-prescribed thyroid product.  She has never had thyroid surgery, or XRT to the thyroid.  she has never been on amiodarone or lithium.  Main symptoms are sweating, constipation, fatigue, myalgias, dry skin, and flushing.  She takes synthroid 75/d, BIW, and 1/2 tab other days.   Past Medical History:  Diagnosis Date  . Allergic rhinitis   . Anxiety   . Chest pain    cr  . Chronic gastritis    dx'd by EGD 2018 (erosive, likely NSAID-induced). 06/2019 +gastritis.  . Chronic pain syndrome    right knee osteo, hx of TKA in R knee.  Pt was told by [name] that no further surgical options are available for her; also, chronic lower back pain.--[name].  [name] Chronic renal insufficiency, stage II (mild) 2015   vs acute fluctuations depending on hydration?---GFR from 60s up to 80s.  . Diverticulosis of colon    with hx of 'itis  . DJD (degenerative joint disease)    L spine and knees.  R knee inject 03/15/18, L knee inject 12/26/18 (by Dr. Lynann Bologna).  [name] Epigastric pain    As of 01/2017, GI (Dr. Loletha Carrow) planned EGD.  [name] FATIGUE / MALAISE 07/01/2009   Chronic  . GERD (gastroesophageal reflux disease)   . History of colonic polyps   . HTN (hypertension)   . Hypercholesterolemia    recommended restart of simvastatin 07/2018  . Hypothyroid 07/05/2012  . IBS (irritable bowel syndrome)   . IC (interstitial cystitis)    [name] at [place]  . Iron deficiency anemia, unspecified 06/27/2013   Has required IV iron on multiple occasions ([name]).  Hemoccults neg 12/2016.  . Lumbar back pain    Spinal stenosis, lumbar region with neurogenic claudication L5-S1 sx's--ESI's no  help.  CT L spine stable post-surg appearance as of 12/2019  . Major depression, recurrent ([place])    vs dysthymia,chronic  . Memory impairment 10/03/2012   Memory impairment 10/03/2012 >referral to neuro    . Monoclonal gammopathy of undetermined significance    IgM kappa MGUS ([name]) routine f/u has shown stability of this problem (most recent f/u 05/2019)  . Neuromuscular disorder ([place])   . OSA (obstructive sleep apnea)    Latest sleep study 01/2017.  Some adjustments to CPAP being made+ dental referral for consideration of oral appliance --per pulm MD.  . Lebron Quam 04/2014; 03/2018   2015: T score -2.0 FRAX 11%/1.8%.  2019 FRAX 10%/1.5%.  . Otitis externa, candida 11/90   [name] tx'd her with clotrimazole 1% external solution x 1 mo  . Palpitations   . Psoriasis   . Trochanteric bursitis of both hips    + injections by ortho in the past  . Vertigo 2020/21   6+ wks, ->to ENT->?vestib neuronitis with ongoing motion sensitivity, gradually resolving.([name] 12/17/05)  . Vitamin D deficiency   . Xerostomia    and xerophthalmia--per [name] (he suggests SSA, SSB, ESR, rh factor, ANA, and antimicrosmal and antithyroglobulin ab's as of 02/17/16--all these labs were NORMAL.    Past Surgical History:  Procedure Laterality Date  . ABDOMINAL HYSTERECTOMY  for prolapse with abdominal incision  . BACK SURGERY  01/23/2015   Lumbar decompression with L2-L5 fusion ([name])  . CARDIAC CATHETERIZATION     no PCI  . CHOLECYSTECTOMY    . COLONOSCOPY  04/2006;07/16/19   Diverticulosis, o/w normal.  06/2019->2 adenomas, +Diverticulosis.  Recall 2023-2025.  [name] DEXA  04/06/2018   T score -1.5 femur neck, -2.2 radius.  FRAX 10%/1.5%.  Repeat DEXA 2 yrs.  . ESOPHAGOGASTRODUODENOSCOPY  05/29/2009; 03/01/17;07/16/19   Esoph normal.  Gastritis (H pylori NEG) + gastric polyps (benign).  Duodenal biopsy NORMAL.  2018-mild chronic gastritis, H pylori NEG. 06/2019 tortuous esophagus w/out  stenosis +chronic gastritis H pyl neg.  [name] EYE SURGERY Left    "lazy eye"  . HAND SURGERY Right    TRIGGER FINGER; index finger  . HERNIA REPAIR     umbilical  . KNEE ARTHROSCOPY     RIGHT KNEE X 5  . LAMINOTOMY  2010   L-4, L-5 FUSION  . NASAL SINUS SURGERY     polypectomy (remote past)  . OOPHORECTOMY  2010   Laparoscopic BSO (path benign)  . PATELLA FRACTURE SURGERY  10-08   [name]  . POSTERIOR LAMINECTOMY / DECOMPRESSION LUMBAR SPINE  9-09   [name]  . TOTAL KNEE ARTHROPLASTY  12-07 and 4-07   [name], revision 12-09 [name] at [place] History  . Marital status: Married    Spouse name: Shamra Bradeen  . Number of children: 0  . Years of education: Not on file  . Highest education level: Not on file  Occupational History    Employer: RETIRED  Tobacco Use  . Smoking status: Never Smoker  . Smokeless tobacco: Never Used  . Tobacco comment: NEVER USED TOBACCO  Vaping Use  . Vaping Use: Never used  Substance and Sexual Activity  . Alcohol use: No    Alcohol/week: 0.0 standard drinks  . Drug use: No  . Sexual activity: Not Currently    Birth control/protection: Surgical    Comment: 1st intercourse 74 yo-Fewer than 5 partners  Other Topics Concern  . Not on file  Social History Narrative   Married, no children.   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   No Tob.  No alc.  No drugs.   Orig from [place].   Social Determinants of Health   Financial Resource Strain:   . Difficulty of Paying Living Expenses: Not on file  Food Insecurity:   . Worried About Charity fundraiser in the Last Year: Not on file  . Ran Out of Food in the Last Year: Not on file  Transportation Needs:   . Lack of Transportation (Medical): Not on file  . Lack of Transportation (Non-Medical): Not on file  Physical Activity:   . Days of Exercise per Week: Not on file  . Minutes of Exercise per Session: Not on file  Stress:   . Feeling of  Stress : Not on file  Social Connections:   . Frequency of Communication with Friends and Family: Not on file  . Frequency of Social Gatherings with Friends and Family: Not on file  . Attends Religious Services: Not on file  . Active Member of Clubs or Organizations: Not on file  . Attends Archivist Meetings: Not on file  . Marital Status: Not on file  Intimate Partner Violence:   . Fear of Current or Ex-Partner: Not on file  . Emotionally Abused: Not  on file  . Physically Abused: Not on file  . Sexually Abused: Not on file    Current Outpatient Medications on File Prior to Visit  Medication Sig Dispense Refill  . acetaminophen (TYLENOL) 325 MG tablet Take 650 mg by mouth every 6 (six) hours as needed.    . ALPRAZolam (XANAX) 1 MG tablet TAKE 1 TO 2 TABLETS BY MOUTH TWICE DAILY AS NEEDED 120 tablet 1  . Azelastine HCl (ASTEPRO) 0.15 % SOLN 2 sprays each nostril every 12 hours 30 mL 11  . calcium carbonate (OSCAL) 1500 (600 Ca) MG TABS tablet Take by mouth daily.    . Cholecalciferol (VITAMIN D3) 5000 units CAPS Take 4 capsules by mouth daily.     [name] doxylamine, Sleep, (SLEEP AID) 25 MG tablet Take 25 mg by mouth at bedtime as needed.    . hydrocortisone 2.5 % cream Apply 1 application topically as needed. RECTAL ITCHING    . meclizine (ANTIVERT) 25 MG tablet Take 1 tablet (25 mg total) by mouth 3 (three) times daily as needed for dizziness. 45 tablet 0  . meloxicam (MOBIC) 7.5 MG tablet Take 2 tablets (15 mg total) by mouth daily. 180 tablet 1  . metoprolol tartrate (LOPRESSOR) 25 MG tablet Take 1 tablet by mouth once daily 90 tablet 1  . Multiple Vitamins-Iron (MULTIVITAMIN/IRON PO) Take 1 tablet by mouth daily.     [name] NIACIN PO Take by mouth daily.     [name] omeprazole (PRILOSEC) 40 MG capsule Take 1 capsule (40 mg total) by mouth daily. 30 capsule 3  . Oxycodone HCl 10 MG TABS 10 mg every 4 (four) hours as needed.     . promethazine (PHENERGAN) 25 MG tablet TAKE ONE TABLET BY  MOUTH EVERY 6 HOURS AS NEEDED FOR NAUSEA AND VOMITING 90 tablet 1  . simvastatin (ZOCOR) 20 MG tablet TAKE 1 TABLET BY MOUTH AT BEDTIME 90 tablet 1  . Specialty Vitamins Products (MAGNESIUM, AMINO ACID CHELATE,) 133 MG tablet Take 1 tablet by mouth daily.    [name] tolterodine (DETROL LA) 4 MG 24 hr capsule     . traZODone (DESYREL) 50 MG tablet Take 1 tablet by mouth at bedtime.  2   No current facility-administered medications on file prior to visit.    Allergies  Allergen Reactions  . Cymbalta [Duloxetine Hcl]     Facial edema  . Iohexol      Code: HIVES, Desc: pt states her face and throat swells when administered IV contrast - KB, Onset Date: [id number]   . Mepergan [Meperidine-Promethazine]     Cardiac arrest  . Morphine Rash  . Augmentin [Amoxicillin-Pot Clavulanate] Nausea Only  . Citalopram Other (See Comments)    Jittery/heart racing   . Hctz [Hydrochlorothiazide] Other (See Comments)    Too much urinating, felt dried out, etc--NO ALLERGY  . Lexapro [Escitalopram Oxalate] Other (See Comments)    Jittery,heart racing  . Lyrica [Pregabalin] Swelling    Face, hands, fingers, ankles  . Valsartan     REACTION: pt states INTOL to Diovan    Family History  Problem Relation Age of Onset  . Leukemia Brother   . Lung disease Brother   . Thyroid disease Brother   . Melanoma Brother   . Hypertension Mother   . Thyroid disease Mother   . Hypertension Father   . Breast cancer Sister        Age 49's  . Hypertension Sister   . Coronary artery disease Sister   .  Lupus Sister   . Thyroid disease Sister   . Stomach cancer Brother   . Cancer Other        "all over"  . Parkinson's disease Sister   . Colon cancer Neg Hx   . Esophageal cancer Neg Hx   . Rectal cancer Neg Hx     BP 132/82   Pulse 61   Ht _0  (1.676 m)   Wt 189 lb (85.7 kg)   BMI 30.51 kg/m        Review of Systems denies depression, weight gain, memory loss, numbness, and cold intolerance.        Objective:   Physical Exam VS: see vs page GEN: no distress HEAD: head: no deformity eyes: no periorbital swelling, no proptosis external nose and ears are normal NECK: Neck: a healed scar is present.  I do not appreciate a nodule in the thyroid or elsewhere in the neck CHEST WALL: no deformity LUNGS: clear to auscultation CV: reg rate and rhythm, no murmur.  MUSCULOSKELETAL: gait is normal and steady EXTEMITIES: no deformity.  no leg edema NEURO:  cn 2-12 grossly intact.   readily moves all 4's.  sensation is intact to touch on all 4's SKIN:  Normal texture and temperature.  No rash or suspicious lesion is visible.   NODES:  None palpable at the neck PSYCH: alert, well-oriented.  Does not appear anxious nor depressed.   Lab Results  Component Value Date   TSH 9.21 (H) 06/26/2020   T3TOTAL 137 06/26/2020   [place] (2015) normal  I have reviewed outside records, and summarized: Pt was noted to have elevated TSH, and referred here.  I last saw this pt in 2017.  Due to normal TFT on rx, sxs were determined to be non-thyroid-related.     Assessment & Plan:  Chronic primary hypothyroidism, uncontrolled  Patient Instructions  Please change the levothyroxine to 50 mcg per day, every day.   Please do the blood tests in approx 1 month.   I don't know how many of your symptoms are coming from the thyroid, so let's check a few other blood tests then, also. Please come back for a follow-up appointment in 3 months.

## 2020-07-07 NOTE — Patient Instructions (Addendum)
Please change the levothyroxine to 50 mcg per day, every day.   Please do the blood tests in approx 1 month.   I don't know how many of your symptoms are coming from the thyroid, so let's check a few other blood tests then, also. Please come back for a follow-up appointment in 3 months.

## 2020-07-08 ENCOUNTER — Telehealth: Payer: Self-pay | Admitting: Family Medicine

## 2020-07-08 ENCOUNTER — Telehealth: Payer: Self-pay | Admitting: Endocrinology

## 2020-07-08 NOTE — Telephone Encounter (Signed)
Patient called stating when she went to get her prescription yesterday, they gave her Euthyrox 50 mg, but I don't see that we even called that in. I only see the levothyroxine (synthroid) in her chart.

## 2020-07-08 NOTE — Telephone Encounter (Signed)
Spoke with patient's spouse he stated she was not feeling well and to call back next week.

## 2020-07-08 NOTE — Telephone Encounter (Signed)
Left message for patient to contact the pharmacy to see why they gave her a different medication.

## 2020-07-10 DIAGNOSIS — M545 Low back pain, unspecified: Secondary | ICD-10-CM | POA: Diagnosis not present

## 2020-07-10 DIAGNOSIS — M25562 Pain in left knee: Secondary | ICD-10-CM | POA: Diagnosis not present

## 2020-07-10 DIAGNOSIS — R262 Difficulty in walking, not elsewhere classified: Secondary | ICD-10-CM | POA: Diagnosis not present

## 2020-07-10 DIAGNOSIS — Z4789 Encounter for other orthopedic aftercare: Secondary | ICD-10-CM | POA: Diagnosis not present

## 2020-07-12 ENCOUNTER — Other Ambulatory Visit: Payer: Self-pay | Admitting: Family Medicine

## 2020-07-15 DIAGNOSIS — Z4789 Encounter for other orthopedic aftercare: Secondary | ICD-10-CM | POA: Diagnosis not present

## 2020-07-15 DIAGNOSIS — M545 Low back pain, unspecified: Secondary | ICD-10-CM | POA: Diagnosis not present

## 2020-07-15 DIAGNOSIS — R262 Difficulty in walking, not elsewhere classified: Secondary | ICD-10-CM | POA: Diagnosis not present

## 2020-07-15 DIAGNOSIS — M25562 Pain in left knee: Secondary | ICD-10-CM | POA: Diagnosis not present

## 2020-08-06 DIAGNOSIS — K5900 Constipation, unspecified: Secondary | ICD-10-CM | POA: Diagnosis not present

## 2020-08-06 DIAGNOSIS — F329 Major depressive disorder, single episode, unspecified: Secondary | ICD-10-CM | POA: Diagnosis not present

## 2020-08-06 DIAGNOSIS — G894 Chronic pain syndrome: Secondary | ICD-10-CM | POA: Diagnosis not present

## 2020-08-06 DIAGNOSIS — M961 Postlaminectomy syndrome, not elsewhere classified: Secondary | ICD-10-CM | POA: Diagnosis not present

## 2020-08-07 ENCOUNTER — Other Ambulatory Visit: Payer: Medicare Other

## 2020-08-11 ENCOUNTER — Other Ambulatory Visit (INDEPENDENT_AMBULATORY_CARE_PROVIDER_SITE_OTHER): Payer: Medicare Other

## 2020-08-11 ENCOUNTER — Other Ambulatory Visit: Payer: Self-pay

## 2020-08-11 DIAGNOSIS — E039 Hypothyroidism, unspecified: Secondary | ICD-10-CM

## 2020-08-11 DIAGNOSIS — R232 Flushing: Secondary | ICD-10-CM | POA: Diagnosis not present

## 2020-08-11 LAB — T4, FREE: Free T4: 0.64 ng/dL (ref 0.60–1.60)

## 2020-08-11 LAB — TSH: TSH: 5.5 u[IU]/mL — ABNORMAL HIGH (ref 0.35–4.50)

## 2020-08-12 ENCOUNTER — Other Ambulatory Visit: Payer: Self-pay | Admitting: Endocrinology

## 2020-08-12 MED ORDER — LEVOTHYROXINE SODIUM 75 MCG PO TABS: 75.0000 ug | ORAL_TABLET | Freq: Every day | ORAL | 3 refills | Status: DC

## 2020-08-19 ENCOUNTER — Telehealth: Payer: Self-pay | Admitting: Family Medicine

## 2020-08-19 NOTE — Telephone Encounter (Signed)
Left message for patient to schedule Annual Wellness Visit.  Please schedule with Nurse Health Advisor Martha Stanley, RN at [facility]  °

## 2020-08-22 LAB — CATECHOLAMINES, FRACTIONATED, PLASMA
Dopamine: <10 pg/mL
Epinephrine: 33 pg/mL
Norepinephrine: 806 pg/mL
Total Catecholamines: 839 pg/mL

## 2020-08-22 LAB — METANEPHRINES, PLASMA
Metanephrine, Free: <25 pg/mL (ref <=57)
Normetanephrine, Free: 86 pg/mL (ref <=148)
Total Metanephrines-Plasma: 86 pg/mL (ref <=205)

## 2020-08-22 LAB — CALCITONIN: Calcitonin: <2 pg/mL (ref <=5)

## 2020-08-28 DIAGNOSIS — M545 Low back pain, unspecified: Secondary | ICD-10-CM | POA: Diagnosis not present

## 2020-09-02 ENCOUNTER — Encounter: Payer: Self-pay | Admitting: Endocrinology

## 2020-09-03 DIAGNOSIS — K5900 Constipation, unspecified: Secondary | ICD-10-CM | POA: Diagnosis not present

## 2020-09-03 DIAGNOSIS — M961 Postlaminectomy syndrome, not elsewhere classified: Secondary | ICD-10-CM | POA: Diagnosis not present

## 2020-09-03 DIAGNOSIS — F329 Major depressive disorder, single episode, unspecified: Secondary | ICD-10-CM | POA: Diagnosis not present

## 2020-09-03 DIAGNOSIS — Z79891 Long term (current) use of opiate analgesic: Secondary | ICD-10-CM | POA: Diagnosis not present

## 2020-09-03 DIAGNOSIS — G894 Chronic pain syndrome: Secondary | ICD-10-CM | POA: Diagnosis not present

## 2020-09-05 ENCOUNTER — Other Ambulatory Visit: Payer: Self-pay

## 2020-09-05 ENCOUNTER — Other Ambulatory Visit: Payer: Self-pay | Admitting: Endocrinology

## 2020-09-05 ENCOUNTER — Other Ambulatory Visit (INDEPENDENT_AMBULATORY_CARE_PROVIDER_SITE_OTHER): Payer: Medicare Other

## 2020-09-05 DIAGNOSIS — E039 Hypothyroidism, unspecified: Secondary | ICD-10-CM

## 2020-09-05 LAB — T4, FREE: Free T4: 0.89 ng/dL (ref 0.60–1.60)

## 2020-09-05 LAB — TSH: TSH: 0.96 u[IU]/mL (ref 0.35–4.50)

## 2020-09-05 LAB — T3, FREE: T3, Free: 3.3 pg/mL (ref 2.3–4.2)

## 2020-09-08 ENCOUNTER — Other Ambulatory Visit: Payer: Self-pay

## 2020-09-08 ENCOUNTER — Encounter: Payer: Self-pay | Admitting: Family Medicine

## 2020-09-08 ENCOUNTER — Ambulatory Visit (INDEPENDENT_AMBULATORY_CARE_PROVIDER_SITE_OTHER): Payer: Medicare Other | Admitting: Family Medicine

## 2020-09-08 VITALS — BP 134/86 | HR 86 | Temp 97.4°F | Resp 16 | Ht 66.0 in | Wt 178.4 lb

## 2020-09-08 DIAGNOSIS — R4189 Other symptoms and signs involving cognitive functions and awareness: Secondary | ICD-10-CM

## 2020-09-08 DIAGNOSIS — R270 Ataxia, unspecified: Secondary | ICD-10-CM

## 2020-09-08 DIAGNOSIS — H8110 Benign paroxysmal vertigo, unspecified ear: Secondary | ICD-10-CM | POA: Diagnosis not present

## 2020-09-08 DIAGNOSIS — R42 Dizziness and giddiness: Secondary | ICD-10-CM

## 2020-09-08 DIAGNOSIS — R251 Tremor, unspecified: Secondary | ICD-10-CM | POA: Diagnosis not present

## 2020-09-08 LAB — CBC WITH DIFFERENTIAL/PLATELET
Basophils Absolute: 0.1 10*3/uL (ref 0.0–0.1)
Basophils Relative: 0.7 % (ref 0.0–3.0)
Eosinophils Absolute: 0.2 10*3/uL (ref 0.0–0.7)
Eosinophils Relative: 2.2 % (ref 0.0–5.0)
HCT: 46.9 % — ABNORMAL HIGH (ref 36.0–46.0)
Hemoglobin: 15.9 g/dL — ABNORMAL HIGH (ref 12.0–15.0)
Lymphocytes Relative: 31.1 % (ref 12.0–46.0)
Lymphs Abs: 2.5 10*3/uL (ref 0.7–4.0)
MCHC: 33.9 g/dL (ref 30.0–36.0)
MCV: 85 fl (ref 78.0–100.0)
Monocytes Absolute: 0.4 10*3/uL (ref 0.1–1.0)
Monocytes Relative: 5.4 % (ref 3.0–12.0)
Neutro Abs: 4.8 10*3/uL (ref 1.4–7.7)
Neutrophils Relative %: 60.6 % (ref 43.0–77.0)
Platelets: 215 10*3/uL (ref 150.0–400.0)
RBC: 5.51 Mil/uL — ABNORMAL HIGH (ref 3.87–5.11)
RDW: 13.1 % (ref 11.5–15.5)
WBC: 7.9 10*3/uL (ref 4.0–10.5)

## 2020-09-08 LAB — BASIC METABOLIC PANEL
BUN: 29 mg/dL — ABNORMAL HIGH (ref 6–23)
CO2: 24 mEq/L (ref 19–32)
Calcium: 9.2 mg/dL (ref 8.4–10.5)
Chloride: 104 mEq/L (ref 96–112)
Creatinine, Ser: 1.02 mg/dL (ref 0.40–1.20)
GFR: 54.48 mL/min — ABNORMAL LOW (ref >60.00)
Glucose, Bld: 105 mg/dL — ABNORMAL HIGH (ref 70–99)
Potassium: 3.7 mEq/L (ref 3.5–5.1)
Sodium: 139 mEq/L (ref 135–145)

## 2020-09-08 MED ORDER — PROMETHAZINE HCL 25 MG PO TABS: ORAL_TABLET | ORAL | 1 refills | Status: DC

## 2020-09-08 NOTE — Progress Notes (Signed)
OFFICE VISIT  09/08/2020  CC:  Chief Complaint  Patient presents with  . Follow-up    RCI,     HPI:    Patient is a 74 y.o. Caucasian female with multiple chronic medical problems, most significantly chronic fatigue, fibromyalgia syndrome, recurrent MDD/chronic anxiety, HTN, HLD, hypothyroidism, and BPPV who presents accompanied by her husband for ongoing problem of positional vertigo.   Most recent recurrence of this problem was approx 4-5 d/a, unclear to what extent since then but pt is just able to clearly communicate that this worsened significantly yesterday afternoon--room spinning, loses balance when stands up.  +Nausea, episodes last 30 seconds or so.  She cannot think of any trigger (position, at rest, walking--nothing she can identify).  Signif mental fogginess/cognitive slowing with this, worse this time than normal but this is not an uncommon thing for her to have in the past, particularly assoc with her episodes of vertigo.  Her fogginess affects her ability to give history today but husband helps some.  No focal weakness or slurred speech. Has chronic UE tremor that is very mild, R>L arm, tends to get worse with the vertigo episodes. No recent head trauma.  Her meclizine is years old/expired but did not work well in the past.  Says eating and drinking ok.  ROS: no fevers, no CP, no SOB, no wheezing, no cough, no HAs, no rashes, no melena/hematochezia.  No polyuria or polydipsia. No acute vision or hearing abnormalities. No no v/d or abd pain.  No palpitations.   Has chronic urge incont.  No dysuria.  07/06/2019 CT head w/out contrast (for ataxia, worry of CVA): IMPRESSION: 1. No acute intracranial hemorrhage. 2. Moderate age-related atrophy and chronic microvascular ischemic Changes.  Past Medical History:  Diagnosis Date  . Allergic rhinitis   . Anxiety   . Chest pain    cr  . Chronic gastritis    dx'd by EGD 2018 (erosive, likely NSAID-induced). 06/2019  +gastritis.  . Chronic pain syndrome    right knee osteo, hx of TKA in R knee.  Pt was told by [name] that no further surgical options are available for her; also, chronic lower back pain.--[name].  [name] Chronic renal insufficiency, stage II (mild) 2015   vs acute fluctuations depending on hydration?---GFR from 60s up to 80s.  . Diverticulosis of colon    with hx of 'itis  . DJD (degenerative joint disease)    L spine and knees.  R knee inject 03/15/18, L knee inject 12/26/18 (by Dr. Lynann Bologna).  [name] Epigastric pain    As of 01/2017, GI (Dr. Loletha Carrow) planned EGD.  [name] FATIGUE / MALAISE 07/01/2009   Chronic  . GERD (gastroesophageal reflux disease)   . History of colonic polyps   . HTN (hypertension)   . Hypercholesterolemia    recommended restart of simvastatin 07/2018  . Hypothyroid 07/05/2012  . IBS (irritable bowel syndrome)   . IC (interstitial cystitis)    [name] at [place]  . Iron deficiency anemia, unspecified 06/27/2013   Has required IV iron on multiple occasions ([name]).  Hemoccults neg 12/2016.  . Lumbar back pain    Spinal stenosis, lumbar region with neurogenic claudication L5-S1 sx's--ESI's no help.  CT L spine stable post-surg appearance as of 12/2019  . Major depression, recurrent ([place])    vs dysthymia,chronic  . Memory impairment 10/03/2012   Memory impairment 10/03/2012 >referral to neuro    . Monoclonal gammopathy of undetermined significance  IgM kappa MGUS ([name]) routine f/u has shown stability of this problem (most recent f/u 05/2019)  . Neuromuscular disorder ([place])   . OSA (obstructive sleep apnea)    Latest sleep study 01/2017.  Some adjustments to CPAP being made+ dental referral for consideration of oral appliance --per pulm MD.  . Lebron Quam 04/2014; 03/2018   2015: T score -2.0 FRAX 11%/1.8%.  2019 FRAX 10%/1.5%.  . Otitis externa, candida 03/1218   [name] tx'd her with clotrimazole 1% external solution x 1 mo  .  Palpitations   . Psoriasis   . Trochanteric bursitis of both hips    + injections by ortho in the past  . Vertigo 2020/21   6+ wks, ->to ENT->?vestib neuronitis with ongoing motion sensitivity, gradually resolving.([name] 7/58/83)  . Vitamin D deficiency   . Xerostomia    and xerophthalmia--per [name] (he suggests SSA, SSB, ESR, rh factor, ANA, and antimicrosmal and antithyroglobulin ab's as of 02/17/16--all these labs were NORMAL.    Past Surgical History:  Procedure Laterality Date  . ABDOMINAL HYSTERECTOMY     for prolapse with abdominal incision  . BACK SURGERY  01/23/2015   Lumbar decompression with L2-L5 fusion ([name])  . CARDIAC CATHETERIZATION     no PCI  . CHOLECYSTECTOMY    . COLONOSCOPY  04/2006;07/16/19   Diverticulosis, o/w normal.  06/2019->2 adenomas, +Diverticulosis.  Recall 2023-2025.  [name] DEXA  04/06/2018   T score -1.5 femur neck, -2.2 radius.  FRAX 10%/1.5%.  Repeat DEXA 2 yrs.  . ESOPHAGOGASTRODUODENOSCOPY  05/29/2009; 03/01/17;07/16/19   Esoph normal.  Gastritis (H pylori NEG) + gastric polyps (benign).  Duodenal biopsy NORMAL.  2018-mild chronic gastritis, H pylori NEG. 06/2019 tortuous esophagus w/out stenosis +chronic gastritis H pyl neg.  [name] EYE SURGERY Left    "lazy eye"  . HAND SURGERY Right    TRIGGER FINGER; index finger  . HERNIA REPAIR     umbilical  . KNEE ARTHROSCOPY     RIGHT KNEE X 5  . LAMINOTOMY  2010   L-4, L-5 FUSION  . NASAL SINUS SURGERY     polypectomy (remote past)  . OOPHORECTOMY  2010   Laparoscopic BSO (path benign)  . PATELLA FRACTURE SURGERY  10-08   [name]  . POSTERIOR LAMINECTOMY / DECOMPRESSION LUMBAR SPINE  9-09   [name]  . TOTAL KNEE ARTHROPLASTY  12-07 and 4-07   [name], revision 12-09 [name] at [facility]    Outpatient Medications Prior to Visit  Medication Sig Dispense Refill  . acetaminophen (TYLENOL) 325 MG tablet Take 650 mg by mouth every 6 (six) hours as needed.    . ALPRAZolam (XANAX) 1 MG  tablet TAKE 1 TO 2 TABLETS BY MOUTH TWICE DAILY AS NEEDED 120 tablet 1  . Azelastine HCl (ASTEPRO) 0.15 % SOLN 2 sprays each nostril every 12 hours 30 mL 11  . calcium carbonate (OSCAL) 1500 (600 Ca) MG TABS tablet Take by mouth daily.    . Cholecalciferol (VITAMIN D3) 5000 units CAPS Take 4 capsules by mouth daily.     [name] doxylamine, Sleep, (UNISOM) 25 MG tablet Take 25 mg by mouth at bedtime as needed.    . hydrocortisone 2.5 % cream Apply 1 application topically as needed. RECTAL ITCHING    . levothyroxine (SYNTHROID) 75 MCG tablet Take 1 tablet (75 mcg total) by mouth daily. 90 tablet 3  . meloxicam (MOBIC) 7.5 MG tablet Take 2 tablets by mouth once daily 180 tablet  0  . metoprolol tartrate (LOPRESSOR) 25 MG tablet Take 1 tablet by mouth once daily 90 tablet 0  . Multiple Vitamins-Iron (MULTIVITAMIN/IRON PO) Take 1 tablet by mouth daily.    [name] NIACIN PO Take by mouth daily.     [name] omeprazole (PRILOSEC) 40 MG capsule Take 1 capsule (40 mg total) by mouth daily. 30 capsule 3  . Oxycodone HCl 10 MG TABS 10 mg every 4 (four) hours as needed.     . simvastatin (ZOCOR) 20 MG tablet TAKE 1 TABLET BY MOUTH AT BEDTIME 90 tablet 0  . Specialty Vitamins Products (MAGNESIUM, AMINO ACID CHELATE,) 133 MG tablet Take 1 tablet by mouth daily.    [name] tolterodine (DETROL LA) 4 MG 24 hr capsule     . traZODone (DESYREL) 50 MG tablet Take 1 tablet by mouth at bedtime.  2  . meclizine (ANTIVERT) 25 MG tablet Take 1 tablet (25 mg total) by mouth 3 (three) times daily as needed for dizziness. 45 tablet 0  . promethazine (PHENERGAN) 25 MG tablet TAKE ONE TABLET BY MOUTH EVERY 6 HOURS AS NEEDED FOR NAUSEA AND VOMITING 90 tablet 1   No facility-administered medications prior to visit.    Allergies  Allergen Reactions  . Cymbalta [Duloxetine Hcl]     Facial edema  . Iohexol      Code: HIVES, Desc: pt states her face and throat swells when administered IV contrast - KB, Onset Date: [id number]   . Mepergan  [Meperidine-Promethazine]     Cardiac arrest  . Morphine Rash  . Augmentin [Amoxicillin-Pot Clavulanate] Nausea Only  . Citalopram Other (See Comments)    Jittery/heart racing   . Hctz [Hydrochlorothiazide] Other (See Comments)    Too much urinating, felt dried out, etc--NO ALLERGY  . Lexapro [Escitalopram Oxalate] Other (See Comments)    Jittery,heart racing  . Lyrica [Pregabalin] Swelling    Face, hands, fingers, ankles  . Valsartan     REACTION: pt states INTOL to Diovan    ROS As per HPI  PE: Vitals with BMI 09/08/2020 07/07/2020 06/26/2020  Height _0  _1  -  Weight 178 lbs 6 oz 189 lbs 191 lbs 10 oz  BMI 01.60 10.93 -  Systolic 235 573 220  Diastolic 86 82 72  Pulse 86 61 69   Gen: appears tired, sitting in WC but able to get up and walk quite slowly.   Attends well, lucid thought and speech.  Some trouble with word finding. Speech is not slurred. URK:YHCW: no injection, icteris, swelling, or exudate.  EOMI, PERRLA. Mouth: lips without lesion/swelling.  Oral mucosa pink and quite dry. Oropharynx without erythema, exudate, or swelling.  CV: RRR, no m/r/g.   LUNGS: CTA bilat, nonlabored resps, good aeration in all lung fields. Neuro: CN 2-12 intact bilaterally, strength 5/5 in proximal and distal upper extremities and lower extremities bilaterally.  Very slight R arm tremor noted with arms held outstretched. FNF normal bilat.  Gait: slow, knees slightly bent, mildly unsteady but not veering and no shuffling, does slow 3 point turn.  No postural instability.    No pronator drift. DIX HALPIKE testing: head to left is pos for vertigo and nausea but NO NYSTAGMUS. Head to right NEG.  LABS:  Lab Results  Component Value Date   TSH 0.96 09/05/2020   Lab Results  Component Value Date   WBC 6.6 04/09/2020   HGB 13.3 04/09/2020   HCT 40.0 04/09/2020   MCV 88.1 04/09/2020   PLT 172  04/09/2020   Lab Results  Component Value Date   CREATININE 0.88 04/09/2020   BUN 20  04/09/2020   NA 139 04/09/2020   K 3.8 04/09/2020   CL 105 04/09/2020   CO2 27 04/09/2020   Lab Results  Component Value Date   ALT 12 04/09/2020   AST 13 (L) 04/09/2020   ALKPHOS 73 04/09/2020   BILITOT 0.7 04/09/2020   IMPRESSION AND PLAN:  BPPV, severe, recurrent. Ref for vestibular PT, start phenergan 92m q6h prn for nausea. Given her severity, such unsteady gait, and fine R>L tremor, +cognitive slowing I'll check MRI brain.  I do not see any need for urgent imaging such as CT brain. Check cbc and bmet today. Of note, recent thyroid panel monitoring 09/05/20 by Dr. ELoanne Drillingall normal.  An After Visit Summary was printed and given to the patient.  FOLLOW UP: Return in about 2 weeks (around 09/22/2020) for f/u vertigo.  Signed:  PCrissie Sickles MD           09/08/2020

## 2020-09-09 ENCOUNTER — Other Ambulatory Visit: Payer: Self-pay | Admitting: Family Medicine

## 2020-09-15 ENCOUNTER — Other Ambulatory Visit: Payer: Self-pay | Admitting: Family Medicine

## 2020-09-15 NOTE — Telephone Encounter (Signed)
Requesting: alprazolam Contract: 12/26/19 UDS: n/a Last Visit:09/08/20 Next Visit:09/23/20 Last Refill:05/27/20(120,1)  Please Advise. Medication pending

## 2020-09-18 ENCOUNTER — Other Ambulatory Visit: Payer: Self-pay

## 2020-09-21 ENCOUNTER — Ambulatory Visit (HOSPITAL_BASED_OUTPATIENT_CLINIC_OR_DEPARTMENT_OTHER)
Admission: RE | Admit: 2020-09-21 | Discharge: 2020-09-21 | Disposition: A | Discharge status: Home / Self Care | Payer: Medicare Other | Source: Ambulatory Visit | Attending: Family Medicine | Admitting: Family Medicine

## 2020-09-21 ENCOUNTER — Other Ambulatory Visit: Payer: Self-pay

## 2020-09-21 ENCOUNTER — Other Ambulatory Visit: Payer: Self-pay | Admitting: Family Medicine

## 2020-09-21 ENCOUNTER — Encounter: Payer: Self-pay | Admitting: Family Medicine

## 2020-09-21 DIAGNOSIS — R42 Dizziness and giddiness: Secondary | ICD-10-CM

## 2020-09-21 DIAGNOSIS — R270 Ataxia, unspecified: Secondary | ICD-10-CM

## 2020-09-22 ENCOUNTER — Other Ambulatory Visit: Payer: Self-pay

## 2020-09-23 ENCOUNTER — Ambulatory Visit (INDEPENDENT_AMBULATORY_CARE_PROVIDER_SITE_OTHER): Payer: Medicare Other | Admitting: Family Medicine

## 2020-09-23 ENCOUNTER — Encounter: Payer: Self-pay | Admitting: Family Medicine

## 2020-09-23 VITALS — BP 108/68 | HR 50 | Temp 97.4°F | Resp 16 | Ht 66.0 in | Wt 183.2 lb

## 2020-09-23 DIAGNOSIS — H8110 Benign paroxysmal vertigo, unspecified ear: Secondary | ICD-10-CM

## 2020-09-23 DIAGNOSIS — E860 Dehydration: Secondary | ICD-10-CM | POA: Diagnosis not present

## 2020-09-23 NOTE — Progress Notes (Signed)
OFFICE VISIT  09/23/2020  CC:  Chief Complaint  Patient presents with  . Follow-up    dehydration    HPI:    Patient is a 75 y.o. Caucasian female who presents for 2 week f/u severe recurrent BPPV plus dehydration. A/P as of last visit: "BPPV, severe, recurrent. Ref for vestibular PT, start phenergan $RemoveBeforeD'25mg'fvRygCGzjRuVhW$  q6h prn for nausea. Given her severity, such unsteady gait, and fine R>L tremor, +cognitive slowing I'll check MRI brain.  I do not see any need for urgent imaging such as CT brain. Check cbc and bmet today. Of note, recent thyroid panel monitoring 09/05/20 by Dr. Loanne Drilling all normal."  INTERIM HX: Last visit her exam and labs supportive/indicative of dx of dehydration due to poor fluid intake. MRI brain 09/21/20 was essentially normal.  Feeling MUCH better. Drinking about 50 oz water per day now, cutting back on Dr. Malachi Bonds. Vertigo is now gone, sounds like it resolved a few days after I saw her 2 wks ago.  She did do home epley maneuvers.  Has not had PT yet.  Home bp monitoring: nothing lower than today's bp (108/68), nothing >140/90.  HR 55-65. No lightheadedness.  Past Medical History:  Diagnosis Date  . Allergic rhinitis   . Anxiety   . Chest pain    cr  . Chronic gastritis    dx'd by EGD 2018 (erosive, likely NSAID-induced). 06/2019 +gastritis.  . Chronic pain syndrome    right knee osteo, hx of TKA in R knee.  Pt was told by [name] that no further surgical options are available for her; also, chronic lower back pain.--[name].  [name] Chronic renal insufficiency, stage II (mild) 2015   vs acute fluctuations depending on hydration?---GFR from 60s up to 80s.  . Diverticulosis of colon    with hx of 'itis  . DJD (degenerative joint disease)    L spine and knees.  R knee inject 03/15/18, L knee inject 12/26/18 (by Dr. Lynann Bologna).  [name] Epigastric pain    As of 01/2017, GI (Dr. Loletha Carrow) planned EGD.  [name] FATIGUE / MALAISE 07/01/2009   Chronic  . GERD  (gastroesophageal reflux disease)   . History of colonic polyps   . HTN (hypertension)   . Hypercholesterolemia    recommended restart of simvastatin 07/2018  . Hypothyroid 07/05/2012  . IBS (irritable bowel syndrome)   . IC (interstitial cystitis)    [name] at [facility]  . Iron deficiency anemia, unspecified 06/27/2013   Has required IV iron on multiple occasions ([name]).  Hemoccults neg 12/2016.  . Lumbar back pain    Spinal stenosis, lumbar region with neurogenic claudication L5-S1 sx's--ESI's no help.  CT L spine stable post-surg appearance as of 12/2019  . Major depression, recurrent ([place])    vs dysthymia,chronic  . Memory impairment 10/03/2012   Memory impairment 10/03/2012 >referral to neuro    . Monoclonal gammopathy of undetermined significance    IgM kappa MGUS ([name]) routine f/u has shown stability of this problem (most recent f/u 05/2019)  . Neuromuscular disorder ([place])   . OSA (obstructive sleep apnea)    Latest sleep study 01/2017.  Some adjustments to CPAP being made+ dental referral for consideration of oral appliance --per pulm MD.  . Lebron Quam 04/2014; 03/2018   2015: T score -2.0 FRAX 11%/1.8%.  2019 FRAX 10%/1.5%.  . Otitis externa, candida 35/5732   [name] tx'd her with clotrimazole 1% external solution x 1 mo  . Palpitations   .  Psoriasis   . Trochanteric bursitis of both hips    + injections by ortho in the past  . Vertigo 2020/21   6+ wks, ->to ENT->?vestib neuronitis with ongoing motion sensitivity, gradually resolving.([name] 4/88/89). MRI brain normal 09/2020.  . Vitamin D deficiency   . Xerostomia    and xerophthalmia--per [name] (he suggests SSA, SSB, ESR, rh factor, ANA, and antimicrosmal and antithyroglobulin ab's as of 02/17/16--all these labs were NORMAL.    Past Surgical History:  Procedure Laterality Date  . ABDOMINAL HYSTERECTOMY     for prolapse with abdominal incision  . BACK SURGERY  01/23/2015   Lumbar  decompression with L2-L5 fusion ([name])  . CARDIAC CATHETERIZATION     no PCI  . CHOLECYSTECTOMY    . COLONOSCOPY  04/2006;07/16/19   Diverticulosis, o/w normal.  06/2019->2 adenomas, +Diverticulosis.  Recall 2023-2025.  [name] DEXA  04/06/2018   T score -1.5 femur neck, -2.2 radius.  FRAX 10%/1.5%.  Repeat DEXA 2 yrs.  . ESOPHAGOGASTRODUODENOSCOPY  05/29/2009; 03/01/17;07/16/19   Esoph normal.  Gastritis (H pylori NEG) + gastric polyps (benign).  Duodenal biopsy NORMAL.  2018-mild chronic gastritis, H pylori NEG. 06/2019 tortuous esophagus w/out stenosis +chronic gastritis H pyl neg.  [name] EYE SURGERY Left    "lazy eye"  . HAND SURGERY Right    TRIGGER FINGER; index finger  . HERNIA REPAIR     umbilical  . KNEE ARTHROSCOPY     RIGHT KNEE X 5  . LAMINOTOMY  2010   L-4, L-5 FUSION  . NASAL SINUS SURGERY     polypectomy (remote past)  . OOPHORECTOMY  2010   Laparoscopic BSO (path benign)  . PATELLA FRACTURE SURGERY  10-08   [name]  . POSTERIOR LAMINECTOMY / DECOMPRESSION LUMBAR SPINE  9-09   [name]  . TOTAL KNEE ARTHROPLASTY  12-07 and 4-07   [name], revision 12-09 [name] at [facility]    Outpatient Medications Prior to Visit  Medication Sig Dispense Refill  . acetaminophen (TYLENOL) 325 MG tablet Take 650 mg by mouth every 6 (six) hours as needed.    . ALPRAZolam (XANAX) 1 MG tablet TAKE 1 TO 2 TABLETS BY MOUTH TWICE DAILY AS NEEDED 120 tablet 1  . calcium carbonate (OSCAL) 1500 (600 Ca) MG TABS tablet Take by mouth daily.    . Cholecalciferol (VITAMIN D3) 5000 units CAPS Take 4 capsules by mouth daily.     . diclofenac Sodium (VOLTAREN) 1 % GEL Apply 4 g topically 4 (four) times daily.    [name] doxylamine, Sleep, (UNISOM) 25 MG tablet Take 25 mg by mouth at bedtime as needed.    [name] levothyroxine (SYNTHROID) 75 MCG tablet Take 1 tablet (75 mcg total) by mouth daily. 90 tablet 3  . meloxicam (MOBIC) 7.5 MG tablet Take 2 tablets by mouth once daily 180 tablet 0  . metoprolol tartrate  (LOPRESSOR) 25 MG tablet Take 1 tablet by mouth once daily 90 tablet 0  . Multiple Vitamins-Iron (MULTIVITAMIN/IRON PO) Take 1 tablet by mouth daily.    [name] NIACIN PO Take by mouth daily.     . Oxycodone HCl 10 MG TABS 10 mg every 4 (four) hours as needed.     . promethazine (PHENERGAN) 25 MG tablet TAKE ONE TABLET BY MOUTH EVERY 6 HOURS AS NEEDED FOR NAUSEA AND VOMITING 30 tablet 1  . simvastatin (ZOCOR) 20 MG tablet TAKE 1 TABLET BY MOUTH AT BEDTIME 90 tablet 0  . Specialty Vitamins Products (  MAGNESIUM, AMINO ACID CHELATE,) 133 MG tablet Take 1 tablet by mouth daily.    . traZODone (DESYREL) 50 MG tablet Take 1 tablet by mouth at bedtime.  2  . Azelastine HCl (ASTEPRO) 0.15 % SOLN 2 sprays each nostril every 12 hours (Patient not taking: Reported on 09/23/2020) 30 mL 11  . hydrocortisone 2.5 % cream Apply 1 application topically as needed. RECTAL ITCHING (Patient not taking: Reported on 09/23/2020)    . omeprazole (PRILOSEC) 40 MG capsule Take 1 capsule (40 mg total) by mouth daily. (Patient not taking: Reported on 09/23/2020) 30 capsule 3  . tolterodine (DETROL LA) 4 MG 24 hr capsule  (Patient not taking: Reported on 09/23/2020)     No facility-administered medications prior to visit.    Allergies  Allergen Reactions  . Cymbalta [Duloxetine Hcl]     Facial edema  . Iohexol      Code: HIVES, Desc: pt states her face and throat swells when administered IV contrast - KB, Onset Date: 09983382   . Mepergan [Meperidine-Promethazine]     Cardiac arrest  . Morphine Rash  . Augmentin [Amoxicillin-Pot Clavulanate] Nausea Only  . Citalopram Other (See Comments)    Jittery/heart racing   . Hctz [Hydrochlorothiazide] Other (See Comments)    Too much urinating, felt dried out, etc--NO ALLERGY  . Lexapro [Escitalopram Oxalate] Other (See Comments)    Jittery,heart racing  . Lyrica [Pregabalin] Swelling    Face, hands, fingers, ankles  . Valsartan     REACTION: pt states INTOL to Diovan    ROS As  per HPI  PE: Vitals with BMI 09/23/2020 09/08/2020 07/07/2020  Height $Remov'5\' 6"'tFxgoR$  $Remove'5\' 6"'khOjBjo$  $RemoveB'5\' 6"'NgKzymdl$   Weight 183 lbs 3 oz 178 lbs 6 oz 189 lbs  BMI 29.58 50.53 97.67  Systolic 341 937 902  Diastolic 68 86 82  Pulse 50 86 61     Gen: Alert, well appearing.  Patient is oriented to person, place, time, and situation. AFFECT: pleasant, lucid thought and speech. CV: RRR, no m/r/g.   LUNGS: CTA bilat, nonlabored resps, good aeration in all lung fields. EXT: no clubbing or cyanosis.  Trace R LL pitting edema, no pitting on L LL.  Mild LL "doughy" edema bilat.    LABS:    Chemistry      Component Value Date/Time   NA 139 09/08/2020 1103   NA 138 03/25/2017 1333   NA 138 11/25/2016 1147   K 3.7 09/08/2020 1103   K 4.3 03/25/2017 1333   K 3.8 11/25/2016 1147   CL 104 09/08/2020 1103   CL 104 03/25/2017 1333   CL 103 01/15/2010 0835   CO2 24 09/08/2020 1103   CO2 24 03/25/2017 1333   CO2 25 11/25/2016 1147   BUN 29 (H) 09/08/2020 1103   BUN 12 03/25/2017 1333   BUN 15.3 11/25/2016 1147   CREATININE 1.02 09/08/2020 1103   CREATININE 0.88 04/09/2020 1136   CREATININE 0.76 03/25/2017 1333   CREATININE 0.8 11/25/2016 1147      Component Value Date/Time   CALCIUM 9.2 09/08/2020 1103   CALCIUM 9.5 03/25/2017 1333   CALCIUM 9.2 11/25/2016 1147   ALKPHOS 73 04/09/2020 1136   ALKPHOS 111 03/25/2017 1333   ALKPHOS 99 11/25/2016 1147   AST 13 (L) 04/09/2020 1136   AST 24 11/25/2016 1147   ALT 12 04/09/2020 1136   ALT 30 11/25/2016 1147   BILITOT 0.7 04/09/2020 1136   BILITOT 0.61 11/25/2016 1147     Lab  Results  Component Value Date   WBC 7.9 09/08/2020   HGB 15.9 (H) 09/08/2020   HCT 46.9 (H) 09/08/2020   MCV 85.0 09/08/2020   PLT 215.0 09/08/2020   Lab Results  Component Value Date   TSH 0.96 09/05/2020   IMPRESSION AND PLAN:  1) BPPV, recurrent, recent severe "flare". Resolved with Epley maneuvers at home. She'll still proceed with vestib PT.  2) Dehydration: now hydrating  well and feels better. BP low normal here today but home bp's good.  An After Visit Summary was printed and given to the patient.  FOLLOW UP: Return in about 4 months (around 01/21/2021) for routine chronic illness f/u.  Signed:  Crissie Sickles, MD           09/23/2020

## 2020-10-02 DIAGNOSIS — K5900 Constipation, unspecified: Secondary | ICD-10-CM | POA: Diagnosis not present

## 2020-10-02 DIAGNOSIS — G894 Chronic pain syndrome: Secondary | ICD-10-CM | POA: Diagnosis not present

## 2020-10-02 DIAGNOSIS — F329 Major depressive disorder, single episode, unspecified: Secondary | ICD-10-CM | POA: Diagnosis not present

## 2020-10-02 DIAGNOSIS — M961 Postlaminectomy syndrome, not elsewhere classified: Secondary | ICD-10-CM | POA: Diagnosis not present

## 2020-10-07 ENCOUNTER — Other Ambulatory Visit: Payer: Self-pay | Admitting: Family Medicine

## 2020-10-08 ENCOUNTER — Other Ambulatory Visit: Payer: Self-pay

## 2020-10-09 ENCOUNTER — Ambulatory Visit (INDEPENDENT_AMBULATORY_CARE_PROVIDER_SITE_OTHER): Payer: Medicare Other | Admitting: Endocrinology

## 2020-10-09 VITALS — BP 150/90 | HR 64 | Ht 66.5 in | Wt 186.8 lb

## 2020-10-09 DIAGNOSIS — E039 Hypothyroidism, unspecified: Secondary | ICD-10-CM | POA: Diagnosis not present

## 2020-10-09 LAB — TSH: TSH: 2.26 u[IU]/mL (ref 0.35–4.50)

## 2020-10-09 LAB — T4, FREE: Free T4: 0.82 ng/dL (ref 0.60–1.60)

## 2020-10-09 NOTE — Progress Notes (Signed)
Subjective:    Patient ID: [name], female    DOB: 1946/05/20, 75 y.o.   MRN: [id number]  HPI  Pt returns for f/u of chronic primary hypothyroidism (dx'ed 2010; [place] (2015) was normal).  she reports cold intolerance, hair loss, and fatigue.  Pt says oral temp varies from 95-97.   Past Medical History:  Diagnosis Date  . Allergic rhinitis   . Anxiety   . Chest pain    cr  . Chronic gastritis    dx'd by EGD 2018 (erosive, likely NSAID-induced). 06/2019 +gastritis.  . Chronic pain syndrome    right knee osteo, hx of TKA in R knee.  Pt was told by [name] that no further surgical options are available for her; also, chronic lower back pain.--[name].  [name] Chronic renal insufficiency, stage II (mild) 2015   vs acute fluctuations depending on hydration?---GFR from 60s up to 80s.  . Diverticulosis of colon    with hx of 'itis  . DJD (degenerative joint disease)    L spine and knees.  R knee inject 03/15/18, L knee inject 12/26/18 (by Dr. Lynann Bologna).  [name] Epigastric pain    As of 01/2017, GI (Dr. Loletha Carrow) planned EGD.  [name] FATIGUE / MALAISE 07/01/2009   Chronic  . GERD (gastroesophageal reflux disease)   . History of colonic polyps   . HTN (hypertension)   . Hypercholesterolemia    recommended restart of simvastatin 07/2018  . Hypothyroid 07/05/2012  . IBS (irritable bowel syndrome)   . IC (interstitial cystitis)    [name] at [place]  . Iron deficiency anemia, unspecified 06/27/2013   Has required IV iron on multiple occasions ([name]).  Hemoccults neg 12/2016.  . Lumbar back pain    Spinal stenosis, lumbar region with neurogenic claudication L5-S1 sx's--ESI's no help.  CT L spine stable post-surg appearance as of 12/2019  . Major depression, recurrent ([place])    vs dysthymia,chronic  . Memory impairment 10/03/2012   Memory impairment 10/03/2012 >referral to neuro    . Monoclonal gammopathy of undetermined significance    IgM kappa MGUS ([name]) routine  f/u has shown stability of this problem (most recent f/u 05/2019)  . Neuromuscular disorder ([place])   . OSA (obstructive sleep apnea)    Latest sleep study 01/2017.  Some adjustments to CPAP being made+ dental referral for consideration of oral appliance --per pulm MD.  . Lebron Quam 04/2014; 03/2018   2015: T score -2.0 FRAX 11%/1.8%.  2019 FRAX 10%/1.5%.  . Otitis externa, candida 87/8676   [name] tx'd her with clotrimazole 1% external solution x 1 mo  . Palpitations   . Psoriasis   . Trochanteric bursitis of both hips    + injections by ortho in the past  . Vertigo 2020/21   6+ wks, ->to ENT->?vestib neuronitis with ongoing motion sensitivity, gradually resolving.([name] 04/08/93). MRI brain normal 09/2020.  . Vitamin D deficiency   . Xerostomia    and xerophthalmia--per [name] (he suggests SSA, SSB, ESR, rh factor, ANA, and antimicrosmal and antithyroglobulin ab's as of 02/17/16--all these labs were NORMAL.    Past Surgical History:  Procedure Laterality Date  . ABDOMINAL HYSTERECTOMY     for prolapse with abdominal incision  . BACK SURGERY  01/23/2015   Lumbar decompression with L2-L5 fusion ([name])  . CARDIAC CATHETERIZATION     no PCI  . CHOLECYSTECTOMY    . COLONOSCOPY  04/2006;07/16/19   Diverticulosis, o/w normal.  06/2019->2 adenomas, +Diverticulosis.  Recall 2023-2025.  [name] DEXA  04/06/2018   T score -1.5 femur neck, -2.2 radius.  FRAX 10%/1.5%.  Repeat DEXA 2 yrs.  . ESOPHAGOGASTRODUODENOSCOPY  05/29/2009; 03/01/17;07/16/19   Esoph normal.  Gastritis (H pylori NEG) + gastric polyps (benign).  Duodenal biopsy NORMAL.  2018-mild chronic gastritis, H pylori NEG. 06/2019 tortuous esophagus w/out stenosis +chronic gastritis H pyl neg.  [name] EYE SURGERY Left    "lazy eye"  . HAND SURGERY Right    TRIGGER FINGER; index finger  . HERNIA REPAIR     umbilical  . KNEE ARTHROSCOPY     RIGHT KNEE X 5  . LAMINOTOMY  2010   L-4, L-5 FUSION  . NASAL SINUS SURGERY      polypectomy (remote past)  . OOPHORECTOMY  2010   Laparoscopic BSO (path benign)  . PATELLA FRACTURE SURGERY  10-08   [name]  . POSTERIOR LAMINECTOMY / DECOMPRESSION LUMBAR SPINE  9-09   [name]  . TOTAL KNEE ARTHROPLASTY  12-07 and 4-07   [name], revision 12-09 [name] at [facility] History  . Marital status: Married    Spouse name: Lupie Sawa  . Number of children: 0  . Years of education: Not on file  . Highest education level: Not on file  Occupational History    Employer: RETIRED  Tobacco Use  . Smoking status: Never Smoker  . Smokeless tobacco: Never Used  . Tobacco comment: NEVER USED TOBACCO  Vaping Use  . Vaping Use: Never used  Substance and Sexual Activity  . Alcohol use: No    Alcohol/week: 0.0 standard drinks  . Drug use: No  . Sexual activity: Not Currently    Birth control/protection: Surgical    Comment: 1st intercourse 75 yo-Fewer than 5 partners  Other Topics Concern  . Not on file  Social History Narrative   Married, no children.   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   No Tob.  No alc.  No drugs.   Orig from [place].   Social Determinants of Health   Financial Resource Strain: Not on file  Food Insecurity: Not on file  Transportation Needs: Not on file  Physical Activity: Not on file  Stress: Not on file  Social Connections: Not on file  Intimate Partner Violence: Not on file    Current Outpatient Medications on File Prior to Visit  Medication Sig Dispense Refill  . acetaminophen (TYLENOL) 325 MG tablet Take 650 mg by mouth every 6 (six) hours as needed.    . ALPRAZolam (XANAX) 1 MG tablet TAKE 1 TO 2 TABLETS BY MOUTH TWICE DAILY AS NEEDED 120 tablet 1  . Azelastine HCl (ASTEPRO) 0.15 % SOLN 2 sprays each nostril every 12 hours 30 mL 11  . calcium carbonate (OSCAL) 1500 (600 Ca) MG TABS tablet Take by mouth daily.    . Cholecalciferol (VITAMIN D3) 5000 units CAPS Take 4 capsules by mouth  daily.     . diclofenac Sodium (VOLTAREN) 1 % GEL Apply 4 g topically 4 (four) times daily.    [name] doxylamine, Sleep, (UNISOM) 25 MG tablet Take 25 mg by mouth at bedtime as needed.    . hydrocortisone 2.5 % cream Apply 1 application topically as needed. RECTAL ITCHING    . levothyroxine (SYNTHROID) 75 MCG tablet Take 1 tablet (75 mcg total) by mouth daily. 90 tablet 3  . meloxicam (MOBIC) 7.5 MG tablet Take 2 tablets by mouth  once daily 180 tablet 0  . metoprolol tartrate (LOPRESSOR) 25 MG tablet Take 1 tablet by mouth once daily 90 tablet 0  . Multiple Vitamins-Iron (MULTIVITAMIN/IRON PO) Take 1 tablet by mouth daily.    [name] NIACIN PO Take by mouth daily.     [name] omeprazole (PRILOSEC) 40 MG capsule Take 1 capsule (40 mg total) by mouth daily. 30 capsule 3  . Oxycodone HCl 10 MG TABS 10 mg every 4 (four) hours as needed.     . promethazine (PHENERGAN) 25 MG tablet TAKE ONE TABLET BY MOUTH EVERY 6 HOURS AS NEEDED FOR NAUSEA AND VOMITING 30 tablet 1  . simvastatin (ZOCOR) 20 MG tablet TAKE 1 TABLET BY MOUTH AT BEDTIME 90 tablet 0  . Specialty Vitamins Products (MAGNESIUM, AMINO ACID CHELATE,) 133 MG tablet Take 1 tablet by mouth daily.    [name] tolterodine (DETROL LA) 4 MG 24 hr capsule     . traZODone (DESYREL) 50 MG tablet Take 1 tablet by mouth at bedtime.  2   No current facility-administered medications on file prior to visit.    Allergies  Allergen Reactions  . Cymbalta [Duloxetine Hcl]     Facial edema  . Iohexol      Code: HIVES, Desc: pt states her face and throat swells when administered IV contrast - KB, Onset Date: [id number]   . Mepergan [Meperidine-Promethazine]     Cardiac arrest  . Morphine Rash  . Augmentin [Amoxicillin-Pot Clavulanate] Nausea Only  . Citalopram Other (See Comments)    Jittery/heart racing   . Hctz [Hydrochlorothiazide] Other (See Comments)    Too much urinating, felt dried out, etc--NO ALLERGY  . Lexapro [Escitalopram Oxalate] Other (See Comments)     Jittery,heart racing  . Lyrica [Pregabalin] Swelling    Face, hands, fingers, ankles  . Valsartan     REACTION: pt states INTOL to Diovan    Family History  Problem Relation Age of Onset  . Leukemia Brother   . Lung disease Brother   . Thyroid disease Brother   . Melanoma Brother   . Hypertension Mother   . Thyroid disease Mother   . Hypertension Father   . Breast cancer Sister        Age 84's  . Hypertension Sister   . Coronary artery disease Sister   . Lupus Sister   . Thyroid disease Sister   . Stomach cancer Brother   . Cancer Other        "all over"  . Parkinson's disease Sister   . Colon cancer Neg Hx   . Esophageal cancer Neg Hx   . Rectal cancer Neg Hx     BP (!) 150/90 (BP Location: Right Arm, Patient Position: Sitting, Cuff Size: Normal)   Pulse 64   Ht 5' 6.5" (1.689 m)   Wt 186 lb 12.8 oz (84.7 kg)   SpO2 95%   BMI 29.70 kg/m    Review of Systems She has lost a few lbs since last OV here.      Objective:   Physical Exam VITAL SIGNS:  See vs page GENERAL: no distress NECK: There is no palpable thyroid enlargement.  No thyroid nodule is palpable.  No palpable lymphadenopathy at the anterior neck.  Lab Results  Component Value Date   CREATININE 1.02 09/08/2020   BUN 29 (H) 09/08/2020   NA 139 09/08/2020   K 3.7 09/08/2020   CL 104 09/08/2020   CO2 24 09/08/2020   Lab Results  Component Value  Date   TSH 2.26 10/09/2020   T3TOTAL 137 06/26/2020       Assessment & Plan:  Hypothyroidism: well-replaced.  Please continue the same synthroid Hypothermia, reported by pt.  I told pt I don't know the cause.

## 2020-10-09 NOTE — Patient Instructions (Addendum)
Your blood pressure is high today.  Please see your primary care provider soon, to have it rechecked Blood tests are requested for you today.  We'll let you know about the results.  It is best to never miss the medication.  However, if you do miss it, next best is to double up the next time.  Please come back for a follow-up appointment in 6 months.   

## 2020-10-10 ENCOUNTER — Inpatient Hospital Stay: Payer: Medicare Other | Attending: Hematology & Oncology

## 2020-10-10 ENCOUNTER — Telehealth: Payer: Self-pay | Admitting: Hematology & Oncology

## 2020-10-10 ENCOUNTER — Encounter: Payer: Self-pay | Admitting: Family

## 2020-10-10 ENCOUNTER — Other Ambulatory Visit: Payer: Self-pay

## 2020-10-10 ENCOUNTER — Inpatient Hospital Stay (HOSPITAL_BASED_OUTPATIENT_CLINIC_OR_DEPARTMENT_OTHER): Payer: Medicare Other | Admitting: Family

## 2020-10-10 VITALS — BP 137/49 | HR 57 | Temp 97.4°F | Resp 21 | Ht 66.0 in | Wt 187.8 lb

## 2020-10-10 DIAGNOSIS — E611 Iron deficiency: Secondary | ICD-10-CM | POA: Diagnosis not present

## 2020-10-10 DIAGNOSIS — D500 Iron deficiency anemia secondary to blood loss (chronic): Secondary | ICD-10-CM | POA: Diagnosis not present

## 2020-10-10 DIAGNOSIS — D472 Monoclonal gammopathy: Secondary | ICD-10-CM | POA: Diagnosis not present

## 2020-10-10 LAB — CMP (CANCER CENTER ONLY)
ALT: 15 U/L (ref 0–44)
AST: 16 U/L (ref 15–41)
Albumin: 4 g/dL (ref 3.5–5.0)
Alkaline Phosphatase: 71 U/L (ref 38–126)
Anion gap: 8 (ref 5–15)
BUN: 18 mg/dL (ref 8–23)
CO2: 26 mmol/L (ref 22–32)
Calcium: 9.7 mg/dL (ref 8.9–10.3)
Chloride: 104 mmol/L (ref 98–111)
Creatinine: 0.98 mg/dL (ref 0.44–1.00)
GFR, Estimated: >60 mL/min (ref >60)
Glucose, Bld: 106 mg/dL — ABNORMAL HIGH (ref 70–99)
Potassium: 4.5 mmol/L (ref 3.5–5.1)
Sodium: 138 mmol/L (ref 135–145)
Total Bilirubin: 0.5 mg/dL (ref 0.3–1.2)
Total Protein: 6.8 g/dL (ref 6.5–8.1)

## 2020-10-10 LAB — CBC WITH DIFFERENTIAL (CANCER CENTER ONLY)
Abs Immature Granulocytes: 0.02 10*3/uL (ref 0.00–0.07)
Basophils Absolute: 0 10*3/uL (ref 0.0–0.1)
Basophils Relative: 1 %
Eosinophils Absolute: 0.4 10*3/uL (ref 0.0–0.5)
Eosinophils Relative: 5 %
HCT: 41 % (ref 36.0–46.0)
Hemoglobin: 13.7 g/dL (ref 12.0–15.0)
Immature Granulocytes: 0 %
Lymphocytes Relative: 34 %
Lymphs Abs: 2.4 10*3/uL (ref 0.7–4.0)
MCH: 28.4 pg (ref 26.0–34.0)
MCHC: 33.4 g/dL (ref 30.0–36.0)
MCV: 85.1 fL (ref 80.0–100.0)
Monocytes Absolute: 0.4 10*3/uL (ref 0.1–1.0)
Monocytes Relative: 6 %
Neutro Abs: 3.7 10*3/uL (ref 1.7–7.7)
Neutrophils Relative %: 54 %
Platelet Count: 198 10*3/uL (ref 150–400)
RBC: 4.82 MIL/uL (ref 3.87–5.11)
RDW: 12 % (ref 11.5–15.5)
WBC Count: 6.9 10*3/uL (ref 4.0–10.5)
nRBC: 0 % (ref 0.0–0.2)

## 2020-10-10 NOTE — Progress Notes (Signed)
Hematology and Oncology Follow Up Visit  [name] [id number] Nov 11, 1945 75 y.o. 10/10/2020   Principle Diagnosis:  IgM kappa monoclonal gammopathy of undetermined significance Intermittent iron deficiency anemia  Current Therapy:        IV iron as indicated    Interim History:  [name] is here today for follow-up. She is doing fairly well. Unfortunately, her home life is still stressful.  She completed PT for vertigo but plans to request her PCP order PCP for strengthening.  She feels fatigued.  She has maintained a good appetite and is staying well hydrated. She states that some of her medications give her dry mouth.  No fever, chills, n/v, cough, rash, dizziness, SOB, chest pain, palpitations, abdominal pain or changes in bowel or bladder habits.  She takes a stool softener as needed for constipation.  No swelling in her extremities at this time. She has generalized aches and pains and some numbness and tingling in her toes with arthritis.  No falls or syncopal episodes to report.   ECOG Performance Status: 1 - Symptomatic but completely ambulatory  Medications:  Allergies as of 10/10/2020      Reactions   Cymbalta [duloxetine Hcl]    Facial edema   Iohexol     Code: HIVES, Desc: pt states her face and throat swells when administered IV contrast - KB, Onset Date: [id number]   Mepergan [meperidine-promethazine]    Cardiac arrest   Morphine Rash   Augmentin [amoxicillin-pot Clavulanate] Nausea Only   Citalopram Other (See Comments)   Jittery/heart racing   Hctz [hydrochlorothiazide] Other (See Comments)   Too much urinating, felt dried out, etc--NO ALLERGY   Lexapro [escitalopram Oxalate] Other (See Comments)   Jittery,heart racing   Lyrica [pregabalin] Swelling   Face, hands, fingers, ankles   Valsartan    REACTION: pt states INTOL to Diovan      Medication List       Accurate as of October 10, 2020 11:34 AM. If you have any questions, ask your nurse or  doctor.        acetaminophen 325 MG tablet Commonly known as: TYLENOL Take 650 mg by mouth every 6 (six) hours as needed.   ALPRAZolam 1 MG tablet Commonly known as: XANAX TAKE 1 TO 2 TABLETS BY MOUTH TWICE DAILY AS NEEDED   Azelastine HCl 0.15 % Soln Commonly known as: Astepro 2 sprays each nostril every 12 hours   calcium carbonate 1500 (600 Ca) MG Tabs tablet Commonly known as: OSCAL Take by mouth daily.   diclofenac Sodium 1 % Gel Commonly known as: VOLTAREN Apply 4 g topically 4 (four) times daily.   doxylamine (Sleep) 25 MG tablet Commonly known as: UNISOM Take 25 mg by mouth at bedtime as needed.   hydrocortisone 2.5 % cream Apply 1 application topically as needed. RECTAL ITCHING   levothyroxine 75 MCG tablet Commonly known as: SYNTHROID Take 1 tablet (75 mcg total) by mouth daily.   magnesium (amino acid chelate) 133 MG tablet Take 1 tablet by mouth daily.   meloxicam 7.5 MG tablet Commonly known as: MOBIC Take 2 tablets by mouth once daily   metoprolol tartrate 25 MG tablet Commonly known as: LOPRESSOR Take 1 tablet by mouth once daily   MULTIVITAMIN/IRON PO Take 1 tablet by mouth daily.   NIACIN PO Take by mouth daily.   omeprazole 40 MG capsule Commonly known as: PRILOSEC Take 1 capsule (40 mg total) by mouth daily.   Oxycodone HCl 10 MG Tabs  10 mg every 4 (four) hours as needed.   promethazine 25 MG tablet Commonly known as: PHENERGAN TAKE ONE TABLET BY MOUTH EVERY 6 HOURS AS NEEDED FOR NAUSEA AND VOMITING   simvastatin 20 MG tablet Commonly known as: ZOCOR TAKE 1 TABLET BY MOUTH AT BEDTIME   tolterodine 4 MG 24 hr capsule Commonly known as: DETROL LA   traZODone 50 MG tablet Commonly known as: DESYREL Take 1 tablet by mouth at bedtime.   Vitamin D3 125 MCG (5000 UT) Caps Take 4 capsules by mouth daily.       Allergies:  Allergies  Allergen Reactions   Cymbalta [Duloxetine Hcl]     Facial edema   Iohexol      Code:  HIVES, Desc: pt states her face and throat swells when administered IV contrast - KB, Onset Date: [id number]    Mepergan [Meperidine-Promethazine]     Cardiac arrest   Morphine Rash   Augmentin [Amoxicillin-Pot Clavulanate] Nausea Only   Citalopram Other (See Comments)    Jittery/heart racing    Hctz [Hydrochlorothiazide] Other (See Comments)    Too much urinating, felt dried out, etc--NO ALLERGY   Lexapro [Escitalopram Oxalate] Other (See Comments)    Jittery,heart racing   Lyrica [Pregabalin] Swelling    Face, hands, fingers, ankles   Valsartan     REACTION: pt states INTOL to Diovan    Past Medical History, Surgical history, Social history, and Family History were reviewed and updated.  Review of Systems: All other 10 point review of systems is negative.   Physical Exam:  vitals were not taken for this visit.   Wt Readings from Last 3 Encounters:  10/09/20 186 lb 12.8 oz (84.7 kg)  09/23/20 183 lb 3.2 oz (83.1 kg)  09/08/20 178 lb 6.4 oz (80.9 kg)    Ocular: Sclerae unicteric, pupils equal, round and reactive to light Ear-nose-throat: Oropharynx clear, dentition fair Lymphatic: No cervical or supraclavicular adenopathy Lungs no rales or rhonchi, good excursion bilaterally Heart regular rate and rhythm, no murmur appreciated Abd soft, nontender, positive bowel sounds MSK no focal spinal tenderness, no joint edema Neuro: non-focal, well-oriented, appropriate affect Breasts: Deferred   Lab Results  Component Value Date   WBC 6.9 10/10/2020   HGB 13.7 10/10/2020   HCT 41.0 10/10/2020   MCV 85.1 10/10/2020   PLT 198 10/10/2020   Lab Results  Component Value Date   FERRITIN 285 04/09/2020   IRON 74 04/09/2020   TIBC 265 04/09/2020   UIBC 191 04/09/2020   IRONPCTSAT 28 04/09/2020   Lab Results  Component Value Date   RETICCTPCT 1.2 01/20/2011   RBC 4.82 10/10/2020   RETICCTABS 52.2 01/20/2011   Lab Results  Component Value Date   KPAFRELGTCHN 20.2  (H) 04/09/2020   LAMBDASER 18.2 04/09/2020   KAPLAMBRATIO 1.11 04/09/2020   Lab Results  Component Value Date   IGGSERUM 633 04/09/2020   IGGSERUM 641 04/09/2020   IGA 245 04/09/2020   IGA 261 04/09/2020   IGMSERUM 496 (H) 04/09/2020   IGMSERUM 507 (H) 04/09/2020   Lab Results  Component Value Date   TOTALPROTELP 6.5 04/09/2020   ALBUMINELP 3.5 04/09/2020   A1GS 0.2 04/09/2020   A2GS 0.8 04/09/2020   BETS 1.0 04/09/2020   BETA2SER 0.5 06/19/2015   GAMS 1.0 04/09/2020   MSPIKE 0.5 (H) 04/09/2020   SPEI Comment 10/04/2019     Chemistry      Component Value Date/Time   NA 139 09/08/2020 1103   NA  138 03/25/2017 1333   NA 138 11/25/2016 1147   K 3.7 09/08/2020 1103   K 4.3 03/25/2017 1333   K 3.8 11/25/2016 1147   CL 104 09/08/2020 1103   CL 104 03/25/2017 1333   CL 103 01/15/2010 0835   CO2 24 09/08/2020 1103   CO2 24 03/25/2017 1333   CO2 25 11/25/2016 1147   BUN 29 (H) 09/08/2020 1103   BUN 12 03/25/2017 1333   BUN 15.3 11/25/2016 1147   CREATININE 1.02 09/08/2020 1103   CREATININE 0.88 04/09/2020 1136   CREATININE 0.76 03/25/2017 1333   CREATININE 0.8 11/25/2016 1147      Component Value Date/Time   CALCIUM 9.2 09/08/2020 1103   CALCIUM 9.5 03/25/2017 1333   CALCIUM 9.2 11/25/2016 1147   ALKPHOS 73 04/09/2020 1136   ALKPHOS 111 03/25/2017 1333   ALKPHOS 99 11/25/2016 1147   AST 13 (L) 04/09/2020 1136   AST 24 11/25/2016 1147   ALT 12 04/09/2020 1136   ALT 30 11/25/2016 1147   BILITOT 0.7 04/09/2020 1136   BILITOT 0.61 11/25/2016 1147       Impression and Plan: Ms. Mizzi is a very pleasant 75 yo caucasian female with MGUS and history of iron deficiency.  Iron and protein studies are pending. We will replace iron intravenously if needed.  Follow-up in 6 months.  She was encouraged to contact our office with any questions or concerns.   Laverna Peace, NP 1/21/202211:34 AM

## 2020-10-10 NOTE — Telephone Encounter (Signed)
I called and LMVM for patient with updated appointments that have been scheduled per 1/21 los

## 2020-10-11 LAB — IGG, IGA, IGM
IgA: 235 mg/dL (ref 64–422)
IgG (Immunoglobin G), Serum: 687 mg/dL (ref 586–1602)
IgM (Immunoglobulin M), Srm: 488 mg/dL — ABNORMAL HIGH (ref 26–217)

## 2020-10-13 ENCOUNTER — Other Ambulatory Visit: Payer: Self-pay | Admitting: Family Medicine

## 2020-10-13 LAB — IRON AND TIBC
Iron: 84 ug/dL (ref 41–142)
Saturation Ratios: 29 % (ref 21–57)
TIBC: 295 ug/dL (ref 236–444)
UIBC: 211 ug/dL (ref 120–384)

## 2020-10-13 LAB — KAPPA/LAMBDA LIGHT CHAINS
Kappa free light chain: 21.7 mg/L — ABNORMAL HIGH (ref 3.3–19.4)
Kappa, lambda light chain ratio: 1.09 (ref 0.26–1.65)
Lambda free light chains: 20 mg/L (ref 5.7–26.3)

## 2020-10-13 LAB — FERRITIN: Ferritin: 248 ng/mL (ref 11–307)

## 2020-10-14 ENCOUNTER — Telehealth: Payer: Self-pay | Admitting: Family Medicine

## 2020-10-14 LAB — PROTEIN ELECTROPHORESIS, SERUM, WITH REFLEX
A/G Ratio: 1.2 (ref 0.7–1.7)
Albumin ELP: 3.7 g/dL (ref 2.9–4.4)
Alpha-1-Globulin: 0.2 g/dL (ref 0.0–0.4)
Alpha-2-Globulin: 0.8 g/dL (ref 0.4–1.0)
Beta Globulin: 1 g/dL (ref 0.7–1.3)
Gamma Globulin: 1.1 g/dL (ref 0.4–1.8)
Globulin, Total: 3.1 g/dL (ref 2.2–3.9)
M-Spike, %: 0.5 g/dL — ABNORMAL HIGH
SPEP Interpretation: 0
Total Protein ELP: 6.8 g/dL (ref 6.0–8.5)

## 2020-10-14 LAB — IMMUNOFIXATION REFLEX, SERUM
IgA: 282 mg/dL (ref 64–422)
IgG (Immunoglobin G), Serum: 877 mg/dL (ref 586–1602)
IgM (Immunoglobulin M), Srm: 623 mg/dL — ABNORMAL HIGH (ref 26–217)

## 2020-10-14 NOTE — Telephone Encounter (Signed)
Spoke with patient spouse he requested to call back later this week

## 2020-10-22 ENCOUNTER — Other Ambulatory Visit (HOSPITAL_BASED_OUTPATIENT_CLINIC_OR_DEPARTMENT_OTHER): Payer: Self-pay | Admitting: Family Medicine

## 2020-10-22 DIAGNOSIS — Z1231 Encounter for screening mammogram for malignant neoplasm of breast: Secondary | ICD-10-CM

## 2020-10-27 ENCOUNTER — Other Ambulatory Visit: Payer: Self-pay

## 2020-10-27 ENCOUNTER — Ambulatory Visit (HOSPITAL_BASED_OUTPATIENT_CLINIC_OR_DEPARTMENT_OTHER)
Admission: RE | Admit: 2020-10-27 | Discharge: 2020-10-27 | Disposition: A | Discharge status: Home / Self Care | Payer: Medicare Other | Source: Ambulatory Visit | Attending: Family Medicine | Admitting: Family Medicine

## 2020-10-27 DIAGNOSIS — Z1231 Encounter for screening mammogram for malignant neoplasm of breast: Secondary | ICD-10-CM | POA: Insufficient documentation

## 2020-11-03 DIAGNOSIS — K5900 Constipation, unspecified: Secondary | ICD-10-CM | POA: Diagnosis not present

## 2020-11-03 DIAGNOSIS — F329 Major depressive disorder, single episode, unspecified: Secondary | ICD-10-CM | POA: Diagnosis not present

## 2020-11-03 DIAGNOSIS — G894 Chronic pain syndrome: Secondary | ICD-10-CM | POA: Diagnosis not present

## 2020-11-03 DIAGNOSIS — M961 Postlaminectomy syndrome, not elsewhere classified: Secondary | ICD-10-CM | POA: Diagnosis not present

## 2020-11-12 DIAGNOSIS — M4306 Spondylolysis, lumbar region: Secondary | ICD-10-CM | POA: Diagnosis not present

## 2020-11-12 DIAGNOSIS — R262 Difficulty in walking, not elsewhere classified: Secondary | ICD-10-CM | POA: Diagnosis not present

## 2020-11-12 DIAGNOSIS — R531 Weakness: Secondary | ICD-10-CM | POA: Diagnosis not present

## 2020-11-18 DIAGNOSIS — M4306 Spondylolysis, lumbar region: Secondary | ICD-10-CM | POA: Diagnosis not present

## 2020-11-18 DIAGNOSIS — R531 Weakness: Secondary | ICD-10-CM | POA: Diagnosis not present

## 2020-11-18 DIAGNOSIS — R262 Difficulty in walking, not elsewhere classified: Secondary | ICD-10-CM | POA: Diagnosis not present

## 2020-11-20 DIAGNOSIS — R531 Weakness: Secondary | ICD-10-CM | POA: Diagnosis not present

## 2020-11-20 DIAGNOSIS — M4306 Spondylolysis, lumbar region: Secondary | ICD-10-CM | POA: Diagnosis not present

## 2020-11-20 DIAGNOSIS — R262 Difficulty in walking, not elsewhere classified: Secondary | ICD-10-CM | POA: Diagnosis not present

## 2020-11-25 DIAGNOSIS — R262 Difficulty in walking, not elsewhere classified: Secondary | ICD-10-CM | POA: Diagnosis not present

## 2020-11-25 DIAGNOSIS — M4306 Spondylolysis, lumbar region: Secondary | ICD-10-CM | POA: Diagnosis not present

## 2020-11-25 DIAGNOSIS — R531 Weakness: Secondary | ICD-10-CM | POA: Diagnosis not present

## 2020-11-27 DIAGNOSIS — M4306 Spondylolysis, lumbar region: Secondary | ICD-10-CM | POA: Diagnosis not present

## 2020-11-27 DIAGNOSIS — R531 Weakness: Secondary | ICD-10-CM | POA: Diagnosis not present

## 2020-11-27 DIAGNOSIS — R262 Difficulty in walking, not elsewhere classified: Secondary | ICD-10-CM | POA: Diagnosis not present

## 2020-12-01 DIAGNOSIS — F329 Major depressive disorder, single episode, unspecified: Secondary | ICD-10-CM | POA: Diagnosis not present

## 2020-12-01 DIAGNOSIS — K5900 Constipation, unspecified: Secondary | ICD-10-CM | POA: Diagnosis not present

## 2020-12-01 DIAGNOSIS — G894 Chronic pain syndrome: Secondary | ICD-10-CM | POA: Diagnosis not present

## 2020-12-01 DIAGNOSIS — M961 Postlaminectomy syndrome, not elsewhere classified: Secondary | ICD-10-CM | POA: Diagnosis not present

## 2020-12-02 DIAGNOSIS — M4306 Spondylolysis, lumbar region: Secondary | ICD-10-CM | POA: Diagnosis not present

## 2020-12-02 DIAGNOSIS — R262 Difficulty in walking, not elsewhere classified: Secondary | ICD-10-CM | POA: Diagnosis not present

## 2020-12-02 DIAGNOSIS — R531 Weakness: Secondary | ICD-10-CM | POA: Diagnosis not present

## 2020-12-04 DIAGNOSIS — R262 Difficulty in walking, not elsewhere classified: Secondary | ICD-10-CM | POA: Diagnosis not present

## 2020-12-04 DIAGNOSIS — M4306 Spondylolysis, lumbar region: Secondary | ICD-10-CM | POA: Diagnosis not present

## 2020-12-04 DIAGNOSIS — R531 Weakness: Secondary | ICD-10-CM | POA: Diagnosis not present

## 2020-12-09 ENCOUNTER — Other Ambulatory Visit: Payer: Self-pay | Admitting: Family

## 2020-12-09 DIAGNOSIS — M4306 Spondylolysis, lumbar region: Secondary | ICD-10-CM | POA: Diagnosis not present

## 2020-12-09 DIAGNOSIS — R531 Weakness: Secondary | ICD-10-CM | POA: Diagnosis not present

## 2020-12-09 DIAGNOSIS — R262 Difficulty in walking, not elsewhere classified: Secondary | ICD-10-CM | POA: Diagnosis not present

## 2020-12-11 DIAGNOSIS — M4306 Spondylolysis, lumbar region: Secondary | ICD-10-CM | POA: Diagnosis not present

## 2020-12-11 DIAGNOSIS — R531 Weakness: Secondary | ICD-10-CM | POA: Diagnosis not present

## 2020-12-11 DIAGNOSIS — R262 Difficulty in walking, not elsewhere classified: Secondary | ICD-10-CM | POA: Diagnosis not present

## 2020-12-16 DIAGNOSIS — R262 Difficulty in walking, not elsewhere classified: Secondary | ICD-10-CM | POA: Diagnosis not present

## 2020-12-16 DIAGNOSIS — M4306 Spondylolysis, lumbar region: Secondary | ICD-10-CM | POA: Diagnosis not present

## 2020-12-16 DIAGNOSIS — R531 Weakness: Secondary | ICD-10-CM | POA: Diagnosis not present

## 2020-12-19 DIAGNOSIS — R531 Weakness: Secondary | ICD-10-CM | POA: Diagnosis not present

## 2020-12-19 DIAGNOSIS — M4306 Spondylolysis, lumbar region: Secondary | ICD-10-CM | POA: Diagnosis not present

## 2020-12-19 DIAGNOSIS — R262 Difficulty in walking, not elsewhere classified: Secondary | ICD-10-CM | POA: Diagnosis not present

## 2020-12-26 DIAGNOSIS — M47816 Spondylosis without myelopathy or radiculopathy, lumbar region: Secondary | ICD-10-CM | POA: Diagnosis not present

## 2020-12-29 DIAGNOSIS — F329 Major depressive disorder, single episode, unspecified: Secondary | ICD-10-CM | POA: Diagnosis not present

## 2020-12-29 DIAGNOSIS — K5900 Constipation, unspecified: Secondary | ICD-10-CM | POA: Diagnosis not present

## 2020-12-29 DIAGNOSIS — M961 Postlaminectomy syndrome, not elsewhere classified: Secondary | ICD-10-CM | POA: Diagnosis not present

## 2020-12-29 DIAGNOSIS — G894 Chronic pain syndrome: Secondary | ICD-10-CM | POA: Diagnosis not present

## 2020-12-30 DIAGNOSIS — R262 Difficulty in walking, not elsewhere classified: Secondary | ICD-10-CM | POA: Diagnosis not present

## 2020-12-30 DIAGNOSIS — R531 Weakness: Secondary | ICD-10-CM | POA: Diagnosis not present

## 2020-12-30 DIAGNOSIS — M4306 Spondylolysis, lumbar region: Secondary | ICD-10-CM | POA: Diagnosis not present

## 2021-01-12 DIAGNOSIS — M4306 Spondylolysis, lumbar region: Secondary | ICD-10-CM | POA: Diagnosis not present

## 2021-01-12 DIAGNOSIS — R262 Difficulty in walking, not elsewhere classified: Secondary | ICD-10-CM | POA: Diagnosis not present

## 2021-01-12 DIAGNOSIS — R531 Weakness: Secondary | ICD-10-CM | POA: Diagnosis not present

## 2021-01-19 ENCOUNTER — Other Ambulatory Visit: Payer: Self-pay

## 2021-01-20 ENCOUNTER — Encounter: Payer: Self-pay | Admitting: Family Medicine

## 2021-01-20 ENCOUNTER — Ambulatory Visit: Payer: Medicare Other | Admitting: Family Medicine

## 2021-01-20 NOTE — Progress Notes (Deleted)
OFFICE VISIT  01/20/2021  CC: No chief complaint on file.   HPI:    Patient is a 75 y.o. Caucasian female who presents for f/u chronic fatigue, fibromyalgia syndrome, recurrent MDD/chronic anxiety, HTN, HLD. Dr. Loanne Drilling is following her for her hypothyroidism. Dr. Greta Doom is following her for chronic pain mgmt. [name] is following her for IDA and MGUS. [name] is her NS for her L spine DDD.  A/P as of 06/26/20 f/u with me: A/P as of last visit: "1) Chronic anxiety: she is stable and will continue alprazolam regularly. No new rx needed for this med today. CSC UTD.  2) Hypothyroidism: monitor TSH, T4, T3 today. Continue 1/2 75 mcg tab qd.  3) HLD: tolerating simvastatin 20mg -->continue this, and we'll check FLP today. Recent hepatic panel 03/2020 via [name] was normal.  4) HTN: continue lopressor 25mg  qd. 03/2020 renal function and lytes normal via [name].  5) Chronic pain: multifactorial. Continue mgmt via her pain specialist, Dr. Greta Doom.  6) Recurrent nausea: likely anxiety, GERD, and polypharmacy related. Will RF phenergan that she uses prn."  INTERIM HX: ***    Last lipids check 06/2020 showed some elevation but we chose not to change med at that time.  She had f/u with Dr. Antonieta Pert office 10/10/20 for her MGUS and hx of IDA and all MGUS labs were stable, cbc normal, iron normal. Renal function and electrolytes and hepatic panel normal at that time as well.   PMP AWARE reviewed today: most recent rx for *** was filled ***, # ***, rx by ***. No red flags.   Past Medical History:  Diagnosis Date  . Allergic rhinitis   . Anxiety   . Chest pain    cr  . Chronic gastritis    dx'd by EGD 2018 (erosive, likely NSAID-induced). 06/2019 +gastritis.  . Chronic pain syndrome    right knee osteo, hx of TKA in R knee.  Pt was told by [name] that no further surgical options are available for her; also, chronic lower back pain.--[name].  [name] Chronic renal insufficiency, stage II (mild) 2015   vs acute fluctuations depending on hydration?---GFR from 60s up to 80s.  . Diverticulosis of colon    with hx of 'itis  . DJD (degenerative joint disease)    L spine and knees.  R knee inject 03/15/18, L knee inject 12/26/18 (by Dr. Lynann Bologna).  [name] Epigastric pain    As of 01/2017, GI (Dr. Loletha Carrow) planned EGD.  [name] FATIGUE / MALAISE 07/01/2009   Chronic  . GERD (gastroesophageal reflux disease)   . History of colonic polyps   . HTN (hypertension)   . Hypercholesterolemia    recommended restart of simvastatin 07/2018  . Hypothyroid 07/05/2012  . IBS (irritable bowel syndrome)   . IC (interstitial cystitis)    [name] at [place]  . Iron deficiency anemia, unspecified 06/27/2013   Has required IV iron on multiple occasions ([name]).  Hemoccults neg 12/2016.  . Lumbar back pain    Spinal stenosis, lumbar region with neurogenic claudication L5-S1 sx's--ESI's no help.  CT L spine stable post-surg appearance as of 12/2019  . Major depression, recurrent ([place])    vs dysthymia,chronic  . Memory impairment 10/03/2012   Memory impairment 10/03/2012 >referral to neuro    . Monoclonal gammopathy of undetermined significance    IgM kappa MGUS ([name]) routine f/u has shown stability of this problem (most recent f/u 05/2019)  . Neuromuscular disorder (  [place])   . OSA (obstructive sleep apnea)    Latest sleep study 01/2017.  Some adjustments to CPAP being made+ dental referral for consideration of oral appliance --per pulm MD.  . Lebron Quam 04/2014; 03/2018   2015: T score -2.0 FRAX 11%/1.8%.  2019 FRAX 10%/1.5%.  . Otitis externa, candida 99/3716   [name] tx'd her with clotrimazole 1% external solution x 1 mo  . Palpitations   . Psoriasis   . Trochanteric bursitis of both hips    + injections by ortho in the past  . Vertigo 2020/21   6+ wks, ->to ENT->?vestib neuronitis with ongoing motion sensitivity, gradually resolving.(Dr.  Erik Obey 9/67/89). MRI brain normal 09/2020.  . Vitamin D deficiency   . Xerostomia    and xerophthalmia--per [name] (he suggests SSA, SSB, ESR, rh factor, ANA, and antimicrosmal and antithyroglobulin ab's as of 02/17/16--all these labs were NORMAL.    Past Surgical History:  Procedure Laterality Date  . ABDOMINAL HYSTERECTOMY     for prolapse with abdominal incision  . BACK SURGERY  01/23/2015   Lumbar decompression with L2-L5 fusion ([name])  . CARDIAC CATHETERIZATION     no PCI  . CHOLECYSTECTOMY    . COLONOSCOPY  04/2006;07/16/19   Diverticulosis, o/w normal.  06/2019->2 adenomas, +Diverticulosis.  Recall 2023-2025.  [name] DEXA  04/06/2018   T score -1.5 femur neck, -2.2 radius.  FRAX 10%/1.5%.  Repeat DEXA 2 yrs.  . ESOPHAGOGASTRODUODENOSCOPY  05/29/2009; 03/01/17;07/16/19   Esoph normal.  Gastritis (H pylori NEG) + gastric polyps (benign).  Duodenal biopsy NORMAL.  2018-mild chronic gastritis, H pylori NEG. 06/2019 tortuous esophagus w/out stenosis +chronic gastritis H pyl neg.  [name] EYE SURGERY Left    "lazy eye"  . HAND SURGERY Right    TRIGGER FINGER; index finger  . HERNIA REPAIR     umbilical  . KNEE ARTHROSCOPY     RIGHT KNEE X 5  . LAMINOTOMY  2010   L-4, L-5 FUSION  . NASAL SINUS SURGERY     polypectomy (remote past)  . OOPHORECTOMY  2010   Laparoscopic BSO (path benign)  . PATELLA FRACTURE SURGERY  10-08   [name]  . POSTERIOR LAMINECTOMY / DECOMPRESSION LUMBAR SPINE  9-09   [name]  . TOTAL KNEE ARTHROPLASTY  12-07 and 4-07   [name], revision 12-09 [name] at [facility]    Outpatient Medications Prior to Visit  Medication Sig Dispense Refill  . acetaminophen (TYLENOL) 325 MG tablet Take 650 mg by mouth every 6 (six) hours as needed.    . ALPRAZolam (XANAX) 1 MG tablet TAKE 1 TO 2 TABLETS BY MOUTH TWICE DAILY AS NEEDED 120 tablet 1  . Azelastine HCl (ASTEPRO) 0.15 % SOLN 2 sprays each nostril every 12 hours 30 mL 11  . calcium carbonate (OSCAL) 1500 (600 Ca)  MG TABS tablet Take by mouth daily.    . Cholecalciferol (VITAMIN D3) 5000 units CAPS Take 4 capsules by mouth daily.     . cyclobenzaprine (FLEXERIL) 5 MG tablet Take 5 mg by mouth at bedtime.    . diclofenac Sodium (VOLTAREN) 1 % GEL Apply 4 g topically 4 (four) times daily.    [name] doxylamine, Sleep, (UNISOM) 25 MG tablet Take 25 mg by mouth at bedtime as needed.    . hydrocortisone 2.5 % cream Apply 1 application topically as needed. RECTAL ITCHING    . levothyroxine (SYNTHROID) 75 MCG tablet Take 1 tablet (75 mcg total) by mouth daily. 90 tablet  3  . meloxicam (MOBIC) 7.5 MG tablet Take 2 tablets by mouth once daily 180 tablet 0  . methylPREDNISolone (MEDROL) 4 MG TBPK tablet See admin instructions.    . metoprolol tartrate (LOPRESSOR) 25 MG tablet Take 1 tablet by mouth once daily 90 tablet 0  . Multiple Vitamins-Iron (MULTIVITAMIN/IRON PO) Take 1 tablet by mouth daily.    [name] NIACIN PO Take by mouth daily.     [name] omeprazole (PRILOSEC) 40 MG capsule Take 1 capsule (40 mg total) by mouth daily. 30 capsule 3  . Oxycodone HCl 10 MG TABS 10 mg every 4 (four) hours as needed.     . promethazine (PHENERGAN) 25 MG tablet TAKE ONE TABLET BY MOUTH EVERY 6 HOURS AS NEEDED FOR NAUSEA AND VOMITING 30 tablet 1  . simvastatin (ZOCOR) 20 MG tablet TAKE 1 TABLET BY MOUTH AT BEDTIME 90 tablet 0  . Specialty Vitamins Products (MAGNESIUM, AMINO ACID CHELATE,) 133 MG tablet Take 1 tablet by mouth daily.    [name] tolterodine (DETROL LA) 4 MG 24 hr capsule     . traZODone (DESYREL) 50 MG tablet Take 1 tablet by mouth at bedtime.  2   No facility-administered medications prior to visit.    Allergies  Allergen Reactions  . Cymbalta [Duloxetine Hcl]     Facial edema  . Iohexol      Code: HIVES, Desc: pt states her face and throat swells when administered IV contrast - KB, Onset Date: [id number]   . Mepergan [Meperidine-Promethazine]     Cardiac arrest  . Morphine Rash  . Augmentin [Amoxicillin-Pot Clavulanate]  Nausea Only  . Citalopram Other (See Comments)    Jittery/heart racing   . Hctz [Hydrochlorothiazide] Other (See Comments)    Too much urinating, felt dried out, etc--NO ALLERGY  . Lexapro [Escitalopram Oxalate] Other (See Comments)    Jittery,heart racing  . Lyrica [Pregabalin] Swelling    Face, hands, fingers, ankles  . Promethazine Hcl   . Valsartan     REACTION: pt states INTOL to Diovan    ROS As per HPI  PE: Vitals with BMI 10/10/2020 10/09/2020 09/23/2020  Height $Remov'5\' 6"'voSoOc$  5' 6.5" $Remov'5\' 6"'naJDUG$   Weight 187 lbs 13 oz 186 lbs 13 oz 183 lbs 3 oz  BMI 30.33 90.2 40.97  Systolic 353 299 242  Diastolic 49 90 68  Pulse 57 64 50     ***  LABS:  Lab Results  Component Value Date   TSH 2.26 10/09/2020   Lab Results  Component Value Date   WBC 6.9 10/10/2020   HGB 13.7 10/10/2020   HCT 41.0 10/10/2020   MCV 85.1 10/10/2020   PLT 198 10/10/2020   Lab Results  Component Value Date   IRON 84 10/10/2020   TIBC 295 10/10/2020   FERRITIN 248 10/10/2020   Lab Results  Component Value Date   CREATININE 0.98 10/10/2020   BUN 18 10/10/2020   NA 138 10/10/2020   K 4.5 10/10/2020   CL 104 10/10/2020   CO2 26 10/10/2020   Lab Results  Component Value Date   ALT 15 10/10/2020   AST 16 10/10/2020   ALKPHOS 71 10/10/2020   BILITOT 0.5 10/10/2020   Lab Results  Component Value Date   CHOL 185 06/26/2020   Lab Results  Component Value Date   HDL 31.50 (L) 06/26/2020   Lab Results  Component Value Date   LDLCALC 138 (H) 09/23/2014   Lab Results  Component Value Date   TRIG  332.0 (H) 06/26/2020   Lab Results  Component Value Date   CHOLHDL 6 06/26/2020     IMPRESSION AND PLAN:  No problem-specific Assessment & Plan notes found for this encounter.  FLP if fasting?  An After Visit Summary was printed and given to the patient.  FOLLOW UP: No follow-ups on file.  Signed:  Crissie Sickles, MD           01/20/2021

## 2021-01-21 ENCOUNTER — Ambulatory Visit: Payer: Medicare Other | Admitting: Family Medicine

## 2021-01-28 DIAGNOSIS — G894 Chronic pain syndrome: Secondary | ICD-10-CM | POA: Diagnosis not present

## 2021-01-28 DIAGNOSIS — M961 Postlaminectomy syndrome, not elsewhere classified: Secondary | ICD-10-CM | POA: Diagnosis not present

## 2021-01-28 DIAGNOSIS — F329 Major depressive disorder, single episode, unspecified: Secondary | ICD-10-CM | POA: Diagnosis not present

## 2021-01-28 DIAGNOSIS — Z79891 Long term (current) use of opiate analgesic: Secondary | ICD-10-CM | POA: Diagnosis not present

## 2021-01-28 DIAGNOSIS — K5900 Constipation, unspecified: Secondary | ICD-10-CM | POA: Diagnosis not present

## 2021-01-29 ENCOUNTER — Other Ambulatory Visit: Payer: Self-pay | Admitting: Family Medicine

## 2021-01-30 NOTE — Telephone Encounter (Signed)
Requesting: alprazolam 1mg  Contract: 12/26/2019 UDS: None Last Visit: 09/23/2020 Next Visit: 03/18/2021 Last Refill: 09/15/2020 #120 and 1RF Pt sig: 1-2 tabs bid prn  Please Advise

## 2021-02-27 DIAGNOSIS — M961 Postlaminectomy syndrome, not elsewhere classified: Secondary | ICD-10-CM | POA: Diagnosis not present

## 2021-02-27 DIAGNOSIS — K5900 Constipation, unspecified: Secondary | ICD-10-CM | POA: Diagnosis not present

## 2021-02-27 DIAGNOSIS — G894 Chronic pain syndrome: Secondary | ICD-10-CM | POA: Diagnosis not present

## 2021-02-27 DIAGNOSIS — F329 Major depressive disorder, single episode, unspecified: Secondary | ICD-10-CM | POA: Diagnosis not present

## 2021-03-04 ENCOUNTER — Telehealth: Payer: Self-pay | Admitting: Family Medicine

## 2021-03-04 NOTE — Telephone Encounter (Signed)
Left message for patient to schedule Annual Wellness Visit.  Please schedule with Nurse Health Advisor Leroy Kennedy, RN at [facility].

## 2021-03-11 DIAGNOSIS — M25561 Pain in right knee: Secondary | ICD-10-CM | POA: Diagnosis not present

## 2021-03-11 DIAGNOSIS — M1712 Unilateral primary osteoarthritis, left knee: Secondary | ICD-10-CM | POA: Diagnosis not present

## 2021-03-18 ENCOUNTER — Ambulatory Visit (INDEPENDENT_AMBULATORY_CARE_PROVIDER_SITE_OTHER): Payer: Medicare Other | Admitting: Family Medicine

## 2021-03-18 ENCOUNTER — Encounter: Payer: Self-pay | Admitting: Family Medicine

## 2021-03-18 ENCOUNTER — Other Ambulatory Visit: Payer: Self-pay

## 2021-03-18 VITALS — BP 136/87 | HR 57 | Temp 97.9°F | Resp 16 | Ht 66.0 in | Wt 191.0 lb

## 2021-03-18 DIAGNOSIS — F411 Generalized anxiety disorder: Secondary | ICD-10-CM | POA: Diagnosis not present

## 2021-03-18 DIAGNOSIS — Z79899 Other long term (current) drug therapy: Secondary | ICD-10-CM

## 2021-03-18 DIAGNOSIS — Z0000 Encounter for general adult medical examination without abnormal findings: Secondary | ICD-10-CM

## 2021-03-18 DIAGNOSIS — I10 Essential (primary) hypertension: Secondary | ICD-10-CM | POA: Diagnosis not present

## 2021-03-18 DIAGNOSIS — E7800 Pure hypercholesterolemia, unspecified: Secondary | ICD-10-CM

## 2021-03-18 DIAGNOSIS — R300 Dysuria: Secondary | ICD-10-CM

## 2021-03-18 DIAGNOSIS — E039 Hypothyroidism, unspecified: Secondary | ICD-10-CM | POA: Diagnosis not present

## 2021-03-18 LAB — POCT URINALYSIS DIPSTICK
Bilirubin, UA: NEGATIVE
Blood, UA: NEGATIVE
Glucose, UA: NEGATIVE
Ketones, UA: NEGATIVE
Leukocytes, UA: NEGATIVE
Nitrite, UA: NEGATIVE
Protein, UA: NEGATIVE
Spec Grav, UA: >=1.03 — AB (ref 1.010–1.025)
Urobilinogen, UA: 1 E.U./dL
pH, UA: 6 (ref 5.0–8.0)

## 2021-03-18 MED ORDER — ALPRAZOLAM 1 MG PO TABS: ORAL_TABLET | ORAL | 1 refills | Status: DC

## 2021-03-18 MED ORDER — SIMVASTATIN 20 MG PO TABS: 20.0000 mg | ORAL_TABLET | Freq: Every day | ORAL | 3 refills | Status: DC

## 2021-03-18 MED ORDER — METOPROLOL TARTRATE 25 MG PO TABS: 25.0000 mg | ORAL_TABLET | Freq: Two times a day (BID) | ORAL | 3 refills | Status: DC

## 2021-03-18 NOTE — Progress Notes (Signed)
OFFICE VISIT  03/18/2021  CC: RCI f/u  HPI:    Patient is a 75 y.o. Caucasian female who presents for f/u multiple chronic medical problems: HTN, HLD, GAD w/high risk med use. Her hypothyroidism is now followed/managed by Dr. Loanne Drilling in endocrinology.  She saw her hematologist for f/u Jan 2021 and labs were all stable (MGUS and hx of iron deficiency), future lab work is already ordered by their office.  HTN: some home bp checks  (when pt flushed and "I can feel it in my head") up to 160s-190s/90s, HR 70s).  Urinary: burning with urination and inc urgency last couple days.  HLD: takes zocor daily.  GAD:  takes 1 tab alpraz bid most days, occ more when very stressed.  She has had excessive stress lately due to husband having liver cancer.  PMP AWARE reviewed today: most recent rx for alprazolam $RemoveBefore'1mg'FObVamonxlqIS$  was filled 01/30/21, # 120, rx by me. No red flags.  ROS as above, plus--> no fevers but she is having night sweats, hair loss, and hoarse voice-->she's worried her thyroid hormone level is off.   no CP, no SOB, no wheezing, no cough, no dizziness, no HAs, no rashes, no melena/hematochezia.  No polyuria or polydipsia.   No focal weakness, paresthesias, or tremors.  No acute vision or hearing abnormalities.   No recent changes in lower legs. No n/v/d or abd pain.  No palpitations.     Past Medical History:  Diagnosis Date   Allergic rhinitis    Anxiety    Chest pain    cr   Chronic gastritis    dx'd by EGD 2018 (erosive, likely NSAID-induced). 06/2019 +gastritis.   Chronic pain syndrome    right knee osteo, hx of TKA in R knee.  Pt was told by [name] that no further surgical options are available for her; also, chronic lower back pain.--[name].   Chronic renal insufficiency, stage II (mild) 2015   vs acute fluctuations depending on hydration?---GFR from 60s up to 80s.   Diverticulosis of colon    with hx of 'itis   DJD (degenerative joint disease)    L spine and  knees.  R knee inject 03/15/18, L knee inject 12/26/18 (by Dr. Lynann Bologna).   Epigastric pain    As of 01/2017, GI (Dr. Loletha Carrow) planned EGD.   FATIGUE / MALAISE 07/01/2009   Chronic   GERD (gastroesophageal reflux disease)    History of colonic polyps    HTN (hypertension)    Hypercholesterolemia    recommended restart of simvastatin 07/2018   Hypothyroid 07/05/2012   IBS (irritable bowel syndrome)    IC (interstitial cystitis)    [name] at [facility]   Iron deficiency anemia, unspecified 06/27/2013   Has required IV iron on multiple occasions ([name]).  Hemoccults neg 12/2016. Most recent hem f/u labs normal 09/2020.   Lumbar back pain    Spinal stenosis, lumbar region with neurogenic claudication L5-S1 sx's--ESI's no help.  CT L spine stable post-surg appearance as of 12/2019   Major depression, recurrent ([place])    vs dysthymia,chronic   Memory impairment 10/03/2012   Memory impairment 10/03/2012 >referral to neuro     Monoclonal gammopathy of undetermined significance    IgM kappa MGUS ([name]) routine f/u has shown stability of this problem (most recent f/u 09/2020)   Neuromuscular disorder ([place])    OSA (obstructive sleep apnea)    Latest sleep study 01/2017.  Some adjustments to CPAP being made+  dental referral for consideration of oral appliance --per pulm MD.   Osteopenia 04/2014; 03/2018   2015: T score -2.0 FRAX 11%/1.8%.  2019 FRAX 10%/1.5%.   Otitis externa, candida 09/7508   [name] tx'd her with clotrimazole 1% external solution x 1 mo   Palpitations    Psoriasis    Trochanteric bursitis of both hips    + injections by ortho in the past   Vertigo 2020/21   6+ wks, ->to ENT->?vestib neuronitis with ongoing motion sensitivity, gradually resolving.([name] 2/58/52). MRI brain normal 09/2020.   Vitamin D deficiency    Xerostomia    and xerophthalmia--per [name] (he suggests SSA, SSB, ESR, rh factor, ANA, and antimicrosmal and antithyroglobulin ab's as of  02/17/16--all these labs were NORMAL.    Past Surgical History:  Procedure Laterality Date   ABDOMINAL HYSTERECTOMY     for prolapse with abdominal incision   BACK SURGERY  01/23/2015   Lumbar decompression with L2-L5 fusion ([name])   [facility]     no PCI   CHOLECYSTECTOMY     COLONOSCOPY  04/2006;07/16/19   Diverticulosis, o/w normal.  06/2019->2 adenomas, +Diverticulosis.  Recall 2023-2025.   DEXA  04/06/2018   T score -1.5 femur neck, -2.2 radius.  FRAX 10%/1.5%.  Repeat DEXA 2 yrs.   ESOPHAGOGASTRODUODENOSCOPY  05/29/2009; 03/01/17;07/16/19   Esoph normal.  Gastritis (H pylori NEG) + gastric polyps (benign).  Duodenal biopsy NORMAL.  2018-mild chronic gastritis, H pylori NEG. 06/2019 tortuous esophagus w/out stenosis +chronic gastritis H pyl neg.   EYE SURGERY Left    "lazy eye"   HAND SURGERY Right    TRIGGER FINGER; index finger   HERNIA REPAIR     umbilical   KNEE ARTHROSCOPY     RIGHT KNEE X 5   LAMINOTOMY  2010   L-4, L-5 FUSION   NASAL SINUS SURGERY     polypectomy (remote past)   OOPHORECTOMY  2010   Laparoscopic BSO (path benign)   PATELLA FRACTURE SURGERY  10-08   [name]   POSTERIOR LAMINECTOMY / DECOMPRESSION LUMBAR SPINE  9-09   [name]   TOTAL KNEE ARTHROPLASTY  12-07 and 4-07   [name], revision 12-09 [name] at [facility]    Outpatient Medications Prior to Visit  Medication Sig Dispense Refill   acetaminophen (TYLENOL) 325 MG tablet Take 650 mg by mouth every 6 (six) hours as needed.     calcium carbonate (OSCAL) 1500 (600 Ca) MG TABS tablet Take by mouth daily.     Cholecalciferol (VITAMIN D3) 5000 units CAPS Take 4 capsules by mouth daily.      cyclobenzaprine (FLEXERIL) 5 MG tablet Take 5 mg by mouth at bedtime.     diclofenac Sodium (VOLTAREN) 1 % GEL Apply 4 g topically 4 (four) times daily.     doxylamine, Sleep, (UNISOM) 25 MG tablet Take 25 mg by mouth at bedtime as needed.     hydrocortisone 2.5 % cream Apply 1 application  topically as needed. RECTAL ITCHING     levothyroxine (SYNTHROID) 75 MCG tablet Take 1 tablet (75 mcg total) by mouth daily. 90 tablet 3   meloxicam (MOBIC) 7.5 MG tablet Take 2 tablets by mouth once daily 180 tablet 0   Multiple Vitamins-Iron (MULTIVITAMIN/IRON PO) Take 1 tablet by mouth daily.     NIACIN PO Take by mouth daily.      omeprazole (PRILOSEC) 40 MG capsule Take 1 capsule (40 mg total) by mouth daily. 30 capsule  3   Oxycodone HCl 10 MG TABS 10 mg every 4 (four) hours as needed.      promethazine (PHENERGAN) 25 MG tablet TAKE ONE TABLET BY MOUTH EVERY 6 HOURS AS NEEDED FOR NAUSEA AND VOMITING 30 tablet 1   Specialty Vitamins Products (MAGNESIUM, AMINO ACID CHELATE,) 133 MG tablet Take 1 tablet by mouth daily.     tolterodine (DETROL LA) 4 MG 24 hr capsule      traZODone (DESYREL) 50 MG tablet Take 1 tablet by mouth at bedtime.  2   ALPRAZolam (XANAX) 1 MG tablet TAKE 1 TO 2 TABLETS BY MOUTH TWICE DAILY AS NEEDED 120 tablet 1   methylPREDNISolone (MEDROL DOSEPAK) 4 MG TBPK tablet See admin instructions.     metoprolol tartrate (LOPRESSOR) 25 MG tablet Take 1 tablet by mouth once daily 90 tablet 0   simvastatin (ZOCOR) 20 MG tablet TAKE 1 TABLET BY MOUTH AT BEDTIME 90 tablet 0   Azelastine HCl (ASTEPRO) 0.15 % SOLN 2 sprays each nostril every 12 hours (Patient not taking: Reported on 03/18/2021) 30 mL 11   No facility-administered medications prior to visit.    Allergies  Allergen Reactions   Cymbalta [Duloxetine Hcl]     Facial edema   Iohexol      Code: HIVES, Desc: pt states her face and throat swells when administered IV contrast - KB, Onset Date: [id number]    Mepergan [Meperidine-Promethazine]     Cardiac arrest   Morphine Rash   Augmentin [Amoxicillin-Pot Clavulanate] Nausea Only   Citalopram Other (See Comments)    Jittery/heart racing    Hctz [Hydrochlorothiazide] Other (See Comments)    Too much urinating, felt dried out, etc--NO ALLERGY   Lexapro [Escitalopram  Oxalate] Other (See Comments)    Jittery,heart racing   Lyrica [Pregabalin] Swelling    Face, hands, fingers, ankles   Promethazine Hcl    Valsartan     REACTION: pt states INTOL to Diovan    ROS As per HPI  PE: Vitals with BMI 03/18/2021 10/10/2020 10/09/2020  Height $Remov'5\' 6"'GKqKlh$  $Remove'5\' 6"'yJFqBAy$  5' 6.5"  Weight 191 lbs 187 lbs 13 oz 186 lbs 13 oz  BMI 30.84 47.42 59.5  Systolic 638 756 433  Diastolic 87 49 90  Pulse 57 57 64   Gen: Alert, well appearing.  Patient is oriented to person, place, time, and situation. AFFECT: pleasant, lucid thought and speech. CV: RRR (rate 65 by me), no m/r/g.   LUNGS: CTA bilat, nonlabored resps, good aeration in all lung fields. EXT: no clubbing or cyanosis.  No pitting edema.    LABS:  Lab Results  Component Value Date   TSH 2.26 10/09/2020   Lab Results  Component Value Date   WBC 6.9 10/10/2020   HGB 13.7 10/10/2020   HCT 41.0 10/10/2020   MCV 85.1 10/10/2020   PLT 198 10/10/2020   Lab Results  Component Value Date   IRON 84 10/10/2020   TIBC 295 10/10/2020   FERRITIN 248 10/10/2020   Lab Results  Component Value Date   VITAMINB12 310 04/06/2018   Lab Results  Component Value Date   CREATININE 0.98 10/10/2020   BUN 18 10/10/2020   NA 138 10/10/2020   K 4.5 10/10/2020   CL 104 10/10/2020   CO2 26 10/10/2020   Lab Results  Component Value Date   ALT 15 10/10/2020   AST 16 10/10/2020   ALKPHOS 71 10/10/2020   BILITOT 0.5 10/10/2020   Lab Results  Component Value  Date   CHOL 185 06/26/2020   Lab Results  Component Value Date   HDL 31.50 (L) 06/26/2020   Lab Results  Component Value Date   LDLCALC 138 (H) 09/23/2014   Lab Results  Component Value Date   TRIG 332.0 (H) 06/26/2020   Lab Results  Component Value Date   CHOLHDL 6 06/26/2020   POC CC dipstick UA today: NORMAL.  IMPRESSION AND PLAN:  1) Uncontrolled HTN:  will increase lopressor to 25mg  TWICE per day. Instructions-> Check your blood pressure  and heart  rate 3 days per week and write these numbers down to bring in to review with me in 1 mo-->make morning appt so you can fast and i'll check your cholesterol at that time.  2) HLD: goal LDL <130, trigs in 300s last check. Taking zocor 20mg  qd. Pt not fasting today--will recheck lipid panel at f/u 1 mo.  3) GAD, lots of extra stress lately w/husband's illness and upcoming surgery-->cont alprazolam 1mg , 1-2 bid.  #120, RF x 1 sent in today. CSC updated. UDS today.  4) Hypothyroidism: now followed by Dr. Loanne Drilling. She has a few sx's that could be from thyroid hormone abnormality but she chose to go ahead and wait until she follows-up with him in about month and not check TSH today.  5) Dysuria/urgency: UA today NORMAL.  6) Preventative health care: Due for prevnar 20, Tdap, and shingrix-->she chose to defer any vaccines today.  An After Visit Summary was printed and given to the patient.  FOLLOW UP: Return in about 4 weeks (around 04/15/2021) for f/u BP.  Signed:  Crissie Sickles, MD           03/18/2021

## 2021-03-18 NOTE — Patient Instructions (Signed)
Check your blood pressure  and heart rate 3 days per week and write these numbers down to bring in to review with me in 1 mo-->make morning appt so you can fast and i'll check your cholesterol at that time.

## 2021-03-19 ENCOUNTER — Other Ambulatory Visit: Payer: Self-pay | Admitting: Family Medicine

## 2021-03-27 DIAGNOSIS — M961 Postlaminectomy syndrome, not elsewhere classified: Secondary | ICD-10-CM | POA: Diagnosis not present

## 2021-03-27 DIAGNOSIS — K5900 Constipation, unspecified: Secondary | ICD-10-CM | POA: Diagnosis not present

## 2021-03-27 DIAGNOSIS — G894 Chronic pain syndrome: Secondary | ICD-10-CM | POA: Diagnosis not present

## 2021-03-27 DIAGNOSIS — F329 Major depressive disorder, single episode, unspecified: Secondary | ICD-10-CM | POA: Diagnosis not present

## 2021-03-29 LAB — DRUG MONITORING, PANEL 8 WITH CONFIRMATION, URINE
6 Acetylmorphine: NEGATIVE ng/mL (ref <10)
Alcohol Metabolites: NEGATIVE ng/mL (ref <500)
Alphahydroxyalprazolam: 309 ng/mL — ABNORMAL HIGH (ref <25)
Alphahydroxymidazolam: NEGATIVE ng/mL (ref <50)
Alphahydroxytriazolam: NEGATIVE ng/mL (ref <50)
Aminoclonazepam: NEGATIVE ng/mL (ref <25)
Amphetamines: NEGATIVE ng/mL (ref <500)
Benzodiazepines: POSITIVE ng/mL — AB (ref <100)
Buprenorphine, Urine: NEGATIVE ng/mL (ref <5)
Cocaine Metabolite: NEGATIVE ng/mL (ref <150)
Codeine: NEGATIVE ng/mL (ref <50)
Creatinine: 109.6 mg/dL (ref >=20.0)
Hydrocodone: NEGATIVE ng/mL (ref <50)
Hydromorphone: NEGATIVE ng/mL (ref <50)
Hydroxyethylflurazepam: NEGATIVE ng/mL (ref <50)
Lorazepam: NEGATIVE ng/mL (ref <50)
MDMA: NEGATIVE ng/mL (ref <500)
Marijuana Metabolite: NEGATIVE ng/mL (ref <20)
Morphine: NEGATIVE ng/mL (ref <50)
Nordiazepam: NEGATIVE ng/mL (ref <50)
Norhydrocodone: NEGATIVE ng/mL (ref <50)
Noroxycodone: >10000 ng/mL — ABNORMAL HIGH (ref <50)
Opiates: NEGATIVE ng/mL (ref <100)
Oxazepam: NEGATIVE ng/mL (ref <50)
Oxidant: NEGATIVE ug/mL (ref <200)
Oxycodone: >10000 ng/mL — ABNORMAL HIGH (ref <50)
Oxycodone: POSITIVE ng/mL — AB (ref <100)
Oxymorphone: 4298 ng/mL — ABNORMAL HIGH (ref <50)
Temazepam: NEGATIVE ng/mL (ref <50)
pH: 5.1 (ref 4.5–9.0)

## 2021-03-29 LAB — DM TEMPLATE

## 2021-04-09 ENCOUNTER — Ambulatory Visit: Payer: Medicare Other | Admitting: Endocrinology

## 2021-04-10 ENCOUNTER — Other Ambulatory Visit: Payer: Self-pay

## 2021-04-10 ENCOUNTER — Encounter: Payer: Self-pay | Admitting: Hematology & Oncology

## 2021-04-10 ENCOUNTER — Inpatient Hospital Stay: Payer: Medicare Other | Attending: Hematology & Oncology

## 2021-04-10 ENCOUNTER — Inpatient Hospital Stay (HOSPITAL_BASED_OUTPATIENT_CLINIC_OR_DEPARTMENT_OTHER): Payer: Medicare Other | Admitting: Hematology & Oncology

## 2021-04-10 VITALS — BP 168/82 | HR 61 | Temp 97.9°F | Resp 20 | Wt 196.1 lb

## 2021-04-10 DIAGNOSIS — D500 Iron deficiency anemia secondary to blood loss (chronic): Secondary | ICD-10-CM

## 2021-04-10 DIAGNOSIS — D472 Monoclonal gammopathy: Secondary | ICD-10-CM | POA: Insufficient documentation

## 2021-04-10 DIAGNOSIS — E611 Iron deficiency: Secondary | ICD-10-CM | POA: Diagnosis not present

## 2021-04-10 LAB — CMP (CANCER CENTER ONLY)
ALT: 16 U/L (ref 0–44)
AST: 12 U/L — ABNORMAL LOW (ref 15–41)
Albumin: 4.1 g/dL (ref 3.5–5.0)
Alkaline Phosphatase: 89 U/L (ref 38–126)
Anion gap: 7 (ref 5–15)
BUN: 25 mg/dL — ABNORMAL HIGH (ref 8–23)
CO2: 27 mmol/L (ref 22–32)
Calcium: 9.4 mg/dL (ref 8.9–10.3)
Chloride: 104 mmol/L (ref 98–111)
Creatinine: 0.88 mg/dL (ref 0.44–1.00)
GFR, Estimated: >60 mL/min (ref >60)
Glucose, Bld: 84 mg/dL (ref 70–99)
Potassium: 4.2 mmol/L (ref 3.5–5.1)
Sodium: 138 mmol/L (ref 135–145)
Total Bilirubin: 0.5 mg/dL (ref 0.3–1.2)
Total Protein: 7.2 g/dL (ref 6.5–8.1)

## 2021-04-10 LAB — CBC WITH DIFFERENTIAL (CANCER CENTER ONLY)
Abs Immature Granulocytes: 0.05 10*3/uL (ref 0.00–0.07)
Basophils Absolute: 0.1 10*3/uL (ref 0.0–0.1)
Basophils Relative: 1 %
Eosinophils Absolute: 0.3 10*3/uL (ref 0.0–0.5)
Eosinophils Relative: 3 %
HCT: 42.9 % (ref 36.0–46.0)
Hemoglobin: 14.2 g/dL (ref 12.0–15.0)
Immature Granulocytes: 1 %
Lymphocytes Relative: 31 %
Lymphs Abs: 2.6 10*3/uL (ref 0.7–4.0)
MCH: 29 pg (ref 26.0–34.0)
MCHC: 33.1 g/dL (ref 30.0–36.0)
MCV: 87.7 fL (ref 80.0–100.0)
Monocytes Absolute: 0.6 10*3/uL (ref 0.1–1.0)
Monocytes Relative: 8 %
Neutro Abs: 4.8 10*3/uL (ref 1.7–7.7)
Neutrophils Relative %: 56 %
Platelet Count: 207 10*3/uL (ref 150–400)
RBC: 4.89 MIL/uL (ref 3.87–5.11)
RDW: 13 % (ref 11.5–15.5)
WBC Count: 8.3 10*3/uL (ref 4.0–10.5)
nRBC: 0 % (ref 0.0–0.2)

## 2021-04-10 NOTE — Progress Notes (Signed)
Hematology and Oncology Follow Up Visit  [name] [id number] February 26, 1946 75 y.o. 10/05/2018   Principle Diagnosis:  1. IgM kappa monoclonal gammopathy of undetermined significance 2. Intermittent iron deficiency anemia  Current Therapy:   IV iron as indicated - last received in January 2021    Interim History:  [name] is here today for follow-up.  She is under a lot of stress.  Apparently sister had a stroke.  She is try to recover from the stroke.  We see her husband who has liver cancer.  He is going to have a partial hepatectomy in August.  As far as her MGUS is concerned, she is doing well with this.  When we last saw her back in January, her M spike was 0.5 g/dL.  The IgM level was 550 mg/dL.  She has had no problem with infections.  She has had no change in bowel or bladder habits.  She has had no rashes.  She has had no cough or shortness of breath.  Overall, I would say her performance status is ECOG 1.    Medications:  Allergies as of 10/05/2018       Reactions   Cymbalta [duloxetine Hcl]    Facial edema   Iohexol     Code: HIVES, Desc: pt states her face and throat swells when administered IV contrast - KB, Onset Date: [id number]   Mepergan [meperidine-promethazine]    Cardiac arrest   Morphine Rash   Augmentin [amoxicillin-pot Clavulanate] Nausea Only   Citalopram Other (See Comments)   Jittery/heart racing   Hctz [hydrochlorothiazide] Other (See Comments)   Too much urinating, felt dried out, etc--NO ALLERGY   Lexapro [escitalopram Oxalate] Other (See Comments)   Jittery,heart racing   Lyrica [pregabalin] Swelling   Face, hands, fingers, ankles   Valsartan    REACTION: pt states INTOL to Diovan        Medication List        Accurate as of October 05, 2018 12:55 PM. Always use your most recent med list.          acetaminophen 325 MG tablet Commonly known as:  TYLENOL Take 650 mg by mouth every 6 (six) hours as needed.   ALIGN 4 MG  Caps Take 1 capsule (4 mg total) by mouth daily.   ALPRAZolam 0.5 MG tablet Commonly known as:  XANAX TAKE ONE-HALF TO ONE TABLET BY MOUTH THREE TIMES DAILY   Azelastine HCl 0.15 % Soln Commonly known as:  ASTEPRO 2 sprays each nostril every 12 hours   calcium carbonate 1500 (600 Ca) MG Tabs tablet Commonly known as:  OSCAL Take by mouth daily.   dicyclomine 10 MG capsule Commonly known as:  BENTYL Take 1 capsule (10 mg total) by mouth 3 (three) times daily before meals.   hydrocortisone 2.5 % cream Apply 1 application topically as needed. RECTAL ITCHING   levothyroxine 75 MCG tablet Commonly known as:  SYNTHROID, LEVOTHROID TAKE 1/2 (ONE-HALF) TABLET BY MOUTH ONCE DAILY   magnesium (amino acid chelate) 133 MG tablet Take 1 tablet by mouth daily.   meclizine 25 MG tablet Commonly known as:  ANTIVERT Take 1 tablet (25 mg total) by mouth every 6 (six) hours as needed for dizziness.   MELATONIN PO Take by mouth.   meloxicam 7.5 MG tablet Commonly known as:  MOBIC TAKE 2 TABLETS (15 MG TOTAL) BY MOUTH DAILY.   metoprolol tartrate 25 MG tablet Commonly known as:  LOPRESSOR Take 1 tablet (25 mg  total) by mouth daily.   MULTIVITAMIN/IRON PO Take 1 tablet by mouth daily.   NIACIN PO Take by mouth.   omeprazole 40 MG capsule Commonly known as:  PRILOSEC Take 1 capsule (40 mg total) by mouth daily.   Oxycodone HCl 10 MG Tabs 10 mg every 4 (four) hours as needed. 1 every 6 hours for pain   promethazine 25 MG tablet Commonly known as:  PHENERGAN TAKE ONE TABLET BY MOUTH EVERY 6 HOURS AS NEEDED FOR NAUSEA AND VOMITING   RED YEAST RICE PO Take by mouth.   simvastatin 20 MG tablet Commonly known as:  ZOCOR Take 1 tablet (20 mg total) by mouth at bedtime.   SLEEP AID 25 MG tablet Generic drug:  doxylamine (Sleep) Take 25 mg by mouth at bedtime as needed.   traZODone 50 MG tablet Commonly known as:  DESYREL Take 1 tablet by mouth at bedtime.   Vitamin D3 125  MCG (5000 UT) Caps Take 2 capsules by mouth daily.        Allergies:  Allergies  Allergen Reactions   Cymbalta [Duloxetine Hcl]     Facial edema   Iohexol      Code: HIVES, Desc: pt states her face and throat swells when administered IV contrast - KB, Onset Date: [id number]    Mepergan [Meperidine-Promethazine]     Cardiac arrest   Morphine Rash   Augmentin [Amoxicillin-Pot Clavulanate] Nausea Only   Citalopram Other (See Comments)    Jittery/heart racing    Hctz [Hydrochlorothiazide] Other (See Comments)    Too much urinating, felt dried out, etc--NO ALLERGY   Lexapro [Escitalopram Oxalate] Other (See Comments)    Jittery,heart racing   Lyrica [Pregabalin] Swelling    Face, hands, fingers, ankles   Valsartan     REACTION: pt states INTOL to Diovan    Past Medical History, Surgical history, Social history, and Family History were reviewed and updated.  Review of Systems: Review of Systems  Constitutional: Positive for malaise/fatigue.  Eyes: Positive for blurred vision.  Respiratory: Positive for wheezing.   Cardiovascular: Positive for palpitations.  Gastrointestinal: Positive for nausea.  Genitourinary: Positive for urgency.  Musculoskeletal: Positive for back pain, joint pain and myalgias.  Neurological: Positive for tingling.  Endo/Heme/Allergies: Negative.   Psychiatric/Behavioral: Negative.      Physical Exam:  weight is 174 lb (78.9 kg). Her oral temperature is 97.8 F (36.6 C). Her blood pressure is 135/52 (abnormal) and her pulse is 52 (abnormal). Her respiration is 18 and oxygen saturation is 98%.   Wt Readings from Last 3 Encounters:  10/05/18 174 lb (78.9 kg)  09/05/18 178 lb (80.7 kg)  07/24/18 174 lb 8 oz (79.2 kg)    Physical Exam Vitals signs reviewed.  HENT:     Head: Normocephalic and atraumatic.  Eyes:     Pupils: Pupils are equal, round, and reactive to light.  Neck:     Musculoskeletal: Normal range of motion.  Cardiovascular:      Rate and Rhythm: Normal rate and regular rhythm.     Heart sounds: Normal heart sounds.  Pulmonary:     Effort: Pulmonary effort is normal.     Breath sounds: Normal breath sounds.  Abdominal:     General: Bowel sounds are normal.     Palpations: Abdomen is soft.  Musculoskeletal: Normal range of motion.        General: No tenderness or deformity.  Lymphadenopathy:     Cervical: No cervical adenopathy.  Skin:  General: Skin is warm and dry.     Findings: No erythema or rash.  Neurological:     Mental Status: She is alert and oriented to person, place, and time.  Psychiatric:        Behavior: Behavior normal.        Thought Content: Thought content normal.        Judgment: Judgment normal.     Lab Results  Component Value Date   WBC 14.1 (H) 10/05/2018   HGB 14.8 10/05/2018   HCT 45.9 10/05/2018   MCV 91.4 10/05/2018   PLT 260 10/05/2018   Lab Results  Component Value Date   FERRITIN 143 04/06/2018   IRON 31 (L) 04/06/2018   TIBC 284 04/06/2018   UIBC 253 04/06/2018   IRONPCTSAT 11 (L) 04/06/2018   Lab Results  Component Value Date   RETICCTPCT 1.2 01/20/2011   RBC 5.02 10/05/2018   RETICCTABS 52.2 01/20/2011   Lab Results  Component Value Date   KPAFRELGTCHN 17.3 04/06/2018   LAMBDASER 16.6 04/06/2018   KAPLAMBRATIO 1.04 04/06/2018   Lab Results  Component Value Date   IGGSERUM 667 (L) 04/06/2018   IGA 287 04/06/2018   IGMSERUM 630 (H) 04/06/2018   Lab Results  Component Value Date   TOTALPROTELP 6.7 04/06/2018   ALBUMINELP 3.3 04/06/2018   A1GS 0.2 04/06/2018   A2GS 0.9 04/06/2018   BETS 1.0 04/06/2018   BETA2SER 0.5 06/19/2015   GAMS 1.2 04/06/2018   MSPIKE 0.5 (H) 04/06/2018   SPEI Comment 10/06/2017     Chemistry      Component Value Date/Time   NA 142 10/05/2018 1108   NA 138 03/25/2017 1333   NA 138 11/25/2016 1147   K 3.8 10/05/2018 1108   K 4.3 03/25/2017 1333   K 3.8 11/25/2016 1147   CL 103 10/05/2018 1108   CL 104 03/25/2017  1333   CL 103 01/15/2010 0835   CO2 29 10/05/2018 1108   CO2 24 03/25/2017 1333   CO2 25 11/25/2016 1147   BUN 12 10/05/2018 1108   BUN 12 03/25/2017 1333   BUN 15.3 11/25/2016 1147   CREATININE 0.93 10/05/2018 1108   CREATININE 0.76 03/25/2017 1333   CREATININE 0.8 11/25/2016 1147      Component Value Date/Time   CALCIUM 9.4 10/05/2018 1108   CALCIUM 9.5 03/25/2017 1333   CALCIUM 9.2 11/25/2016 1147   ALKPHOS 104 10/05/2018 1108   ALKPHOS 111 03/25/2017 1333   ALKPHOS 99 11/25/2016 1147   AST 11 (L) 10/05/2018 1108   AST 24 11/25/2016 1147   ALT 14 10/05/2018 1108   ALT 30 11/25/2016 1147   BILITOT 0.5 10/05/2018 1108   BILITOT 0.61 11/25/2016 1147      Impression and Plan: [name] is a very pleasant 75 yo caucasian female with MGUS and history of iron deficiency.   I am sorry that she has to have all the stress.  I know she is managing as well she can.  She has a very strong faith.  I know her faith will get her through difficulties that she has.  I would like to hope that her husband will do well with his surgery.  I think he is stopped drinking which is certainly going to be a positive.  As always, we will plan to get her back in 6 months.     [name], MD 1/16/202012:55 PM

## 2021-04-11 LAB — IGG, IGA, IGM
IgA: 281 mg/dL (ref 64–422)
IgG (Immunoglobin G), Serum: 722 mg/dL (ref 586–1602)
IgM (Immunoglobulin M), Srm: 591 mg/dL — ABNORMAL HIGH (ref 26–217)

## 2021-04-13 LAB — PROTEIN ELECTROPHORESIS, SERUM
A/G Ratio: 1.3 (ref 0.7–1.7)
Albumin ELP: 4 g/dL (ref 2.9–4.4)
Alpha-1-Globulin: 0.2 g/dL (ref 0.0–0.4)
Alpha-2-Globulin: 0.9 g/dL (ref 0.4–1.0)
Beta Globulin: 1.1 g/dL (ref 0.7–1.3)
Gamma Globulin: 1 g/dL (ref 0.4–1.8)
Globulin, Total: 3.1 g/dL (ref 2.2–3.9)
M-Spike, %: 0.5 g/dL — ABNORMAL HIGH
Total Protein ELP: 7.1 g/dL (ref 6.0–8.5)

## 2021-04-13 LAB — IRON AND TIBC
Iron: 92 ug/dL (ref 41–142)
Saturation Ratios: 25 % (ref 21–57)
TIBC: 374 ug/dL (ref 236–444)
UIBC: 282 ug/dL (ref 120–384)

## 2021-04-13 LAB — FERRITIN: Ferritin: 202 ng/mL (ref 11–307)

## 2021-04-13 LAB — KAPPA/LAMBDA LIGHT CHAINS
Kappa free light chain: 18.8 mg/L (ref 3.3–19.4)
Kappa, lambda light chain ratio: 1.07 (ref 0.26–1.65)
Lambda free light chains: 17.6 mg/L (ref 5.7–26.3)

## 2021-04-14 ENCOUNTER — Telehealth: Payer: Self-pay

## 2021-04-14 NOTE — Telephone Encounter (Signed)
-----   Message from [name], MD sent at 04/14/2021  7:25 AM EDT ----- Please call her and tell her that everything is very stable with respect to her protein studies.  This is fantastic news.  [name]

## 2021-04-14 NOTE — Telephone Encounter (Signed)
Called and informed husband Wille Glaser- ok per DPR) of lab results, he verbalized understanding and denies any questions or concerns at this time.

## 2021-04-15 ENCOUNTER — Ambulatory Visit: Payer: Medicare Other | Admitting: Endocrinology

## 2021-04-15 DIAGNOSIS — F329 Major depressive disorder, single episode, unspecified: Secondary | ICD-10-CM | POA: Diagnosis not present

## 2021-04-15 DIAGNOSIS — M961 Postlaminectomy syndrome, not elsewhere classified: Secondary | ICD-10-CM | POA: Diagnosis not present

## 2021-04-15 DIAGNOSIS — K5900 Constipation, unspecified: Secondary | ICD-10-CM | POA: Diagnosis not present

## 2021-04-15 DIAGNOSIS — G894 Chronic pain syndrome: Secondary | ICD-10-CM | POA: Diagnosis not present

## 2021-04-20 ENCOUNTER — Ambulatory Visit: Payer: Medicare Other | Admitting: Family Medicine

## 2021-04-21 ENCOUNTER — Encounter: Payer: Self-pay | Admitting: Family Medicine

## 2021-04-21 ENCOUNTER — Ambulatory Visit (INDEPENDENT_AMBULATORY_CARE_PROVIDER_SITE_OTHER): Payer: Medicare Other | Admitting: Family Medicine

## 2021-04-21 ENCOUNTER — Other Ambulatory Visit: Payer: Self-pay

## 2021-04-21 VITALS — BP 127/76 | HR 57 | Temp 97.3°F | Resp 16 | Ht 66.0 in | Wt 200.8 lb

## 2021-04-21 DIAGNOSIS — E7800 Pure hypercholesterolemia, unspecified: Secondary | ICD-10-CM | POA: Diagnosis not present

## 2021-04-21 DIAGNOSIS — I10 Essential (primary) hypertension: Secondary | ICD-10-CM

## 2021-04-21 LAB — LIPID PANEL
Cholesterol: 208 mg/dL — ABNORMAL HIGH (ref 0–200)
HDL: 45.7 mg/dL (ref >39.00)
LDL Cholesterol: 123 mg/dL — ABNORMAL HIGH (ref 0–99)
NonHDL: 161.9
Total CHOL/HDL Ratio: 5
Triglycerides: 197 mg/dL — ABNORMAL HIGH (ref 0.0–149.0)
VLDL: 39.4 mg/dL (ref 0.0–40.0)

## 2021-04-21 NOTE — Progress Notes (Signed)
OFFICE VISIT  04/21/2021  CC:  Chief Complaint  Patient presents with   Follow-up    Hypertension, pt is fasting   HPI:    Patient is a 75 y.o. Caucasian female who presents for 1 mo f/u uncontrolled HTN as well as f/u HLD (on statin long term). A/P as of last visit: "1) Uncontrolled HTN:  will increase lopressor to 25mg  TWICE per day. Instructions-> Check your blood pressure  and heart rate 3 days per week and write these numbers down to bring in to review with me in 1 mo-->make morning appt so you can fast and i'll check your cholesterol at that time.   2) HLD: goal LDL <130, trigs in 300s last check. Taking zocor 20mg  qd. Pt not fasting today--will recheck lipid panel at f/u 1 mo.   3) GAD, lots of extra stress lately w/husband's illness and upcoming surgery-->cont alprazolam 1mg , 1-2 bid.  #120, RF x 1 sent in today. CSC updated. UDS today.   4) Hypothyroidism: now followed by Dr. Loanne Drilling. She has a few sx's that could be from thyroid hormone abnormality but she chose to go ahead and wait until she follows-up with him in about month and not check TSH today.   5) Dysuria/urgency: UA today NORMAL.   6) Preventative health care: Due for prevnar 20, Tdap, and shingrix-->she chose to defer any vaccines today."  INTERIM HX: Doing fine, has high anxiety chronically, even worse last few months since husband dx'd with cancer. He's awaiting surgery.  She supports him very well.    Home bp's 120s-130 over 70s, HR 50s-60s.  Rare reading above 140 when very stressed.  HLD: taking zocor 20 mg qd long term w/out side effect. Says she drinks a lot of 2% milk. Has cut back on Dr. Samson Frederic.  She had routine f/u with [name] 04/10/21, all was stable with her MGUS labs as well as cbc, cmet, and iron studies.  Past Medical History:  Diagnosis Date   Allergic rhinitis    Anxiety    Chest pain    cr   Chronic gastritis    dx'd by EGD 2018 (erosive, likely NSAID-induced). 06/2019  +gastritis.   Chronic pain syndrome    right knee osteo, hx of TKA in R knee.  Pt was told by [name] that no further surgical options are available for her; also, chronic lower back pain.--[name].   Chronic renal insufficiency, stage II (mild) 2015   vs acute fluctuations depending on hydration?---GFR from 60s up to 80s.   Diverticulosis of colon    with hx of 'itis   DJD (degenerative joint disease)    L spine and knees.  R knee inject 03/15/18, L knee inject 12/26/18 (by Dr. Lynann Bologna).   Epigastric pain    As of 01/2017, GI (Dr. Loletha Carrow) planned EGD.   FATIGUE / MALAISE 07/01/2009   Chronic   GERD (gastroesophageal reflux disease)    History of colonic polyps    HTN (hypertension)    Hypercholesterolemia    recommended restart of simvastatin 07/2018   Hypothyroid 07/05/2012   IBS (irritable bowel syndrome)    IC (interstitial cystitis)    [name] at [facility]   Iron deficiency anemia, unspecified 06/27/2013   Has required IV iron on multiple occasions ([name]).  Hemoccults neg 12/2016. Most recent hem f/u labs normal 09/2020.   Lumbar back pain    Spinal stenosis, lumbar region with neurogenic claudication L5-S1 sx's--ESI's no help.  CT L spine stable post-surg appearance as of 12/2019   Major depression, recurrent ([place])    vs dysthymia,chronic   Memory impairment 10/03/2012   Memory impairment 10/03/2012 >referral to neuro     Monoclonal gammopathy of undetermined significance    IgM kappa MGUS ([name]) routine f/u has shown stability of this problem (most recent f/u 09/2020)   Neuromuscular disorder ([place])    OSA (obstructive sleep apnea)    Latest sleep study 01/2017.  Some adjustments to CPAP being made+ dental referral for consideration of oral appliance --per pulm MD.   Osteopenia 04/2014; 03/2018   2015: T score -2.0 FRAX 11%/1.8%.  2019 FRAX 10%/1.5%.   Otitis externa, candida 36/1443   [name] tx'd her with clotrimazole 1% external solution x  1 mo   Palpitations    Psoriasis    Trochanteric bursitis of both hips    + injections by ortho in the past   Vertigo 2020/21   6+ wks, ->to ENT->?vestib neuronitis with ongoing motion sensitivity, gradually resolving.([name] 1/54/00). MRI brain normal 09/2020.   Vitamin D deficiency    Xerostomia    and xerophthalmia--per [name] (he suggests SSA, SSB, ESR, rh factor, ANA, and antimicrosmal and antithyroglobulin ab's as of 02/17/16--all these labs were NORMAL.    Past Surgical History:  Procedure Laterality Date   ABDOMINAL HYSTERECTOMY     for prolapse with abdominal incision   BACK SURGERY  01/23/2015   Lumbar decompression with L2-L5 fusion ([name])   [place]     no PCI   CHOLECYSTECTOMY     COLONOSCOPY  04/2006;07/16/19   Diverticulosis, o/w normal.  06/2019->2 adenomas, +Diverticulosis.  Recall 2023-2025.   DEXA  04/06/2018   T score -1.5 femur neck, -2.2 radius.  FRAX 10%/1.5%.  Repeat DEXA 2 yrs.   ESOPHAGOGASTRODUODENOSCOPY  05/29/2009; 03/01/17;07/16/19   Esoph normal.  Gastritis (H pylori NEG) + gastric polyps (benign).  Duodenal biopsy NORMAL.  2018-mild chronic gastritis, H pylori NEG. 06/2019 tortuous esophagus w/out stenosis +chronic gastritis H pyl neg.   EYE SURGERY Left    "lazy eye"   HAND SURGERY Right    TRIGGER FINGER; index finger   HERNIA REPAIR     umbilical   KNEE ARTHROSCOPY     RIGHT KNEE X 5   LAMINOTOMY  2010   L-4, L-5 FUSION   NASAL SINUS SURGERY     polypectomy (remote past)   OOPHORECTOMY  2010   Laparoscopic BSO (path benign)   PATELLA FRACTURE SURGERY  10-08   [name]   POSTERIOR LAMINECTOMY / DECOMPRESSION LUMBAR SPINE  9-09   [name]   TOTAL KNEE ARTHROPLASTY  12-07 and 4-07   [name], revision 12-09 [name] at [facility]    Outpatient Medications Prior to Visit  Medication Sig Dispense Refill   acetaminophen (TYLENOL) 325 MG tablet Take 650 mg by mouth every 6 (six) hours as needed.     ALPRAZolam (XANAX)  1 MG tablet TAKE 1 TO 2 TABLETS BY MOUTH TWICE DAILY AS NEEDED. 120 tablet 1   calcium carbonate (OSCAL) 1500 (600 Ca) MG TABS tablet Take by mouth daily.     Cholecalciferol (VITAMIN D3) 5000 units CAPS Take 4 capsules by mouth daily.      diclofenac Sodium (VOLTAREN) 1 % GEL Apply 4 g topically 4 (four) times daily.     doxylamine, Sleep, (UNISOM) 25 MG tablet Take 25 mg by mouth at bedtime as needed.  hydrocortisone 2.5 % cream Apply 1 application topically as needed. RECTAL ITCHING     levothyroxine (SYNTHROID) 75 MCG tablet Take 1 tablet (75 mcg total) by mouth daily. 90 tablet 3   meloxicam (MOBIC) 7.5 MG tablet Take 2 tablets by mouth once daily (Patient taking differently: 7.5 mg daily.) 180 tablet 0   metoprolol tartrate (LOPRESSOR) 25 MG tablet Take 1 tablet (25 mg total) by mouth 2 (two) times daily. 180 tablet 3   Multiple Vitamins-Iron (MULTIVITAMIN/IRON PO) Take 1 tablet by mouth daily.     NIACIN PO Take by mouth daily.      omeprazole (PRILOSEC) 40 MG capsule Take 1 capsule (40 mg total) by mouth daily. 30 capsule 3   Oxycodone HCl 10 MG TABS 10 mg every 4 (four) hours as needed.      promethazine (PHENERGAN) 25 MG tablet TAKE ONE TABLET BY MOUTH EVERY 6 HOURS AS NEEDED FOR NAUSEA AND VOMITING 30 tablet 1   simvastatin (ZOCOR) 20 MG tablet Take 1 tablet (20 mg total) by mouth at bedtime. 90 tablet 3   Specialty Vitamins Products (MAGNESIUM, AMINO ACID CHELATE,) 133 MG tablet Take 1 tablet by mouth daily.     traZODone (DESYREL) 50 MG tablet Take 1 tablet by mouth at bedtime.  2   Azelastine HCl (ASTEPRO) 0.15 % SOLN 2 sprays each nostril every 12 hours (Patient not taking: No sig reported) 30 mL 11   cyclobenzaprine (FLEXERIL) 5 MG tablet Take 5 mg by mouth at bedtime. (Patient not taking: No sig reported)     tolterodine (DETROL LA) 4 MG 24 hr capsule  (Patient not taking: No sig reported)     No facility-administered medications prior to visit.    Allergies  Allergen  Reactions   Cymbalta [Duloxetine Hcl]     Facial edema   Iohexol      Code: HIVES, Desc: pt states her face and throat swells when administered IV contrast - KB, Onset Date: [id number]    Mepergan [Meperidine-Promethazine]     Cardiac arrest   Morphine Rash   Augmentin [Amoxicillin-Pot Clavulanate] Nausea Only   Citalopram Other (See Comments)    Jittery/heart racing    Hctz [Hydrochlorothiazide] Other (See Comments)    Too much urinating, felt dried out, etc--NO ALLERGY   Lexapro [Escitalopram Oxalate] Other (See Comments)    Jittery,heart racing   Lyrica [Pregabalin] Swelling    Face, hands, fingers, ankles   Promethazine Hcl    Valsartan     REACTION: pt states INTOL to Diovan    ROS As per HPI  PE: Vitals with BMI 04/21/2021 04/10/2021 03/18/2021  Height $Remov'5\' 6"'nfpgrC$  - $'5\' 6"'R$   Weight 200 lbs 13 oz 196 lbs 2 oz 191 lbs  BMI 27.78 - 24.23  Systolic 536 144 315  Diastolic 76 82 87  Pulse 57 61 57     Gen: Alert, well appearing.  Patient is oriented to person, place, time, and situation. AFFECT: pleasant, lucid thought and speech. No further exam today.  LABS:  Lab Results  Component Value Date   TSH 2.26 10/09/2020   Lab Results  Component Value Date   WBC 8.3 04/10/2021   HGB 14.2 04/10/2021   HCT 42.9 04/10/2021   MCV 87.7 04/10/2021   PLT 207 04/10/2021   Lab Results  Component Value Date   IRON 92 04/10/2021   TIBC 374 04/10/2021   FERRITIN 202 04/10/2021   Lab Results  Component Value Date   CREATININE 0.88 04/10/2021  BUN 25 (H) 04/10/2021   NA 138 04/10/2021   K 4.2 04/10/2021   CL 104 04/10/2021   CO2 27 04/10/2021   Lab Results  Component Value Date   ALT 16 04/10/2021   AST 12 (L) 04/10/2021   ALKPHOS 89 04/10/2021   BILITOT 0.5 04/10/2021   Lab Results  Component Value Date   CHOL 185 06/26/2020   Lab Results  Component Value Date   HDL 31.50 (L) 06/26/2020   Lab Results  Component Value Date   LDLCALC 138 (H) 09/23/2014   Lab  Results  Component Value Date   TRIG 332.0 (H) 06/26/2020   Lab Results  Component Value Date   CHOLHDL 6 06/26/2020   IMPRESSION AND PLAN:  1) HTN, well controlled now since inc lopressor to $RemoveBefo'25mg'XycQRYCHlaw$  bid. Lytes/kidney function normal 2 wks ago.  2) HLD, mixed: tolerating zocor $RemoveBeforeDE'20mg'seysShpqKbiQDyl$  qd long term. Trigs 332 ten months ago.  May need to add fibrate. FLP today.  Hepatic panel normal 2 wks ago.  An After Visit Summary was printed and given to the patient.  FOLLOW UP: Return in about 6 months (around 10/22/2021) for routine chronic illness f/u.  Signed:  Crissie Sickles, MD           04/21/2021

## 2021-04-22 ENCOUNTER — Ambulatory Visit (INDEPENDENT_AMBULATORY_CARE_PROVIDER_SITE_OTHER): Payer: Medicare Other | Admitting: *Deleted

## 2021-04-22 DIAGNOSIS — Z0000 Encounter for general adult medical examination without abnormal findings: Secondary | ICD-10-CM

## 2021-04-22 NOTE — Patient Instructions (Signed)
[name] , Thank you for taking time to come for your Medicare Wellness Visit. I appreciate your ongoing commitment to your health goals. Please review the following plan we discussed and let me know if I can assist you in the future.   Screening recommendations/referrals: Colonoscopy: up to date Mammogram: up to date Bone Density: call to schedule when ready Recommended yearly ophthalmology/optometry visit for glaucoma screening and checkup Recommended yearly dental visit for hygiene and checkup  Vaccinations: Influenza vaccine: Education provided Pneumococcal vaccine: up to date Tdap vaccine: Education provided Shingles vaccine: Education provided    Advanced directives: Education provided  Conditions/risks identified: na  Next appointment: 11-12-2020 @ 1:00  Dr. [name]   Preventive Care 74 Years and Older, Female Preventive care refers to lifestyle choices and visits with your health care provider that can promote health and wellness. What does preventive care include? A yearly physical exam. This is also called an annual well check. Dental exams once or twice a year. Routine eye exams. Ask your health care provider how often you should have your eyes checked. Personal lifestyle choices, including: Daily care of your teeth and gums. Regular physical activity. Eating a healthy diet. Avoiding tobacco and drug use. Limiting alcohol use. Practicing safe sex. Taking low-dose aspirin every day. Taking vitamin and mineral supplements as recommended by your health care provider. What happens during an annual well check? The services and screenings done by your health care provider during your annual well check will depend on your age, overall health, lifestyle risk factors, and family history of disease. Counseling  Your health care provider may ask you questions about your: Alcohol use. Tobacco use. Drug use. Emotional well-being. Home and relationship well-being. Sexual  activity. Eating habits. History of falls. Memory and ability to understand (cognition). Work and work Statistician. Reproductive health. Screening  You may have the following tests or measurements: Height, weight, and BMI. Blood pressure. Lipid and cholesterol levels. These may be checked every 5 years, or more frequently if you are over 37 years old. Skin check. Lung cancer screening. You may have this screening every year starting at age 78 if you have a 30-pack-year history of smoking and currently smoke or have quit within the past 15 years. Fecal occult blood test (FOBT) of the stool. You may have this test every year starting at age 71. Flexible sigmoidoscopy or colonoscopy. You may have a sigmoidoscopy every 5 years or a colonoscopy every 10 years starting at age 35. Hepatitis C blood test. Hepatitis B blood test. Sexually transmitted disease (STD) testing. Diabetes screening. This is done by checking your blood sugar (glucose) after you have not eaten for a while (fasting). You may have this done every 1-3 years. Bone density scan. This is done to screen for osteoporosis. You may have this done starting at age 48. Mammogram. This may be done every 1-2 years. Talk to your health care provider about how often you should have regular mammograms. Talk with your health care provider about your test results, treatment options, and if necessary, the need for more tests. Vaccines  Your health care provider may recommend certain vaccines, such as: Influenza vaccine. This is recommended every year. Tetanus, diphtheria, and acellular pertussis (Tdap, Td) vaccine. You may need a Td booster every 10 years. Zoster vaccine. You may need this after age 98. Pneumococcal 13-valent conjugate (PCV13) vaccine. One dose is recommended after age 83. Pneumococcal polysaccharide (PPSV23) vaccine. One dose is recommended after age 61. Talk to your health  care provider about which screenings and vaccines  you need and how often you need them. This information is not intended to replace advice given to you by your health care provider. Make sure you discuss any questions you have with your health care provider. Document Released: 10/03/2015 Document Revised: 05/26/2016 Document Reviewed: 07/08/2015 Elsevier Interactive Patient Education  2017 [place] Prevention in the Home Falls can cause injuries. They can happen to people of all ages. There are many things you can do to make your home safe and to help prevent falls. What can I do on the outside of my home? Regularly fix the edges of walkways and driveways and fix any cracks. Remove anything that might make you trip as you walk through a door, such as a raised step or threshold. Trim any bushes or trees on the path to your home. Use bright outdoor lighting. Clear any walking paths of anything that might make someone trip, such as rocks or tools. Regularly check to see if handrails are loose or broken. Make sure that both sides of any steps have handrails. Any raised decks and porches should have guardrails on the edges. Have any leaves, snow, or ice cleared regularly. Use sand or salt on walking paths during winter. Clean up any spills in your garage right away. This includes oil or grease spills. What can I do in the bathroom? Use night lights. Install grab bars by the toilet and in the tub and shower. Do not use towel bars as grab bars. Use non-skid mats or decals in the tub or shower. If you need to sit down in the shower, use a plastic, non-slip stool. Keep the floor dry. Clean up any water that spills on the floor as soon as it happens. Remove soap buildup in the tub or shower regularly. Attach bath mats securely with double-sided non-slip rug tape. Do not have throw rugs and other things on the floor that can make you trip. What can I do in the bedroom? Use night lights. Make sure that you have a light by your bed that  is easy to reach. Do not use any sheets or blankets that are too big for your bed. They should not hang down onto the floor. Have a firm chair that has side arms. You can use this for support while you get dressed. Do not have throw rugs and other things on the floor that can make you trip. What can I do in the kitchen? Clean up any spills right away. Avoid walking on wet floors. Keep items that you use a lot in easy-to-reach places. If you need to reach something above you, use a strong step stool that has a grab bar. Keep electrical cords out of the way. Do not use floor polish or wax that makes floors slippery. If you must use wax, use non-skid floor wax. Do not have throw rugs and other things on the floor that can make you trip. What can I do with my stairs? Do not leave any items on the stairs. Make sure that there are handrails on both sides of the stairs and use them. Fix handrails that are broken or loose. Make sure that handrails are as long as the stairways. Check any carpeting to make sure that it is firmly attached to the stairs. Fix any carpet that is loose or worn. Avoid having throw rugs at the top or bottom of the stairs. If you do have throw rugs, attach them to  the floor with carpet tape. Make sure that you have a light switch at the top of the stairs and the bottom of the stairs. If you do not have them, ask someone to add them for you. What else can I do to help prevent falls? Wear shoes that: Do not have high heels. Have rubber bottoms. Are comfortable and fit you well. Are closed at the toe. Do not wear sandals. If you use a stepladder: Make sure that it is fully opened. Do not climb a closed stepladder. Make sure that both sides of the stepladder are locked into place. Ask someone to hold it for you, if possible. Clearly mark and make sure that you can see: Any grab bars or handrails. First and last steps. Where the edge of each step is. Use tools that help you  move around (mobility aids) if they are needed. These include: Canes. Walkers. Scooters. Crutches. Turn on the lights when you go into a dark area. Replace any light bulbs as soon as they burn out. Set up your furniture so you have a clear path. Avoid moving your furniture around. If any of your floors are uneven, fix them. If there are any pets around you, be aware of where they are. Review your medicines with your doctor. Some medicines can make you feel dizzy. This can increase your chance of falling. Ask your doctor what other things that you can do to help prevent falls. This information is not intended to replace advice given to you by your health care provider. Make sure you discuss any questions you have with your health care provider. Document Released: 07/03/2009 Document Revised: 02/12/2016 Document Reviewed: 10/11/2014 Elsevier Interactive Patient Education  2017 [employer].

## 2021-04-22 NOTE — Progress Notes (Signed)
Subjective:   [name] is a 75 y.o. female who presents for Medicare Annual (Subsequent) preventive examination.  I connected with  [facility] on 04/22/21 by a telephone enabled telemedicine application and verified that I am speaking with the correct person using two identifiers.   I discussed the limitations of evaluation and management by telemedicine. The patient expressed understanding and agreed to proceed.   Review of Systems    NA Cardiac Risk Factors include: advanced age (>27men, >42 women);hypertension;family history of premature cardiovascular disease     Objective:    Today's Vitals   04/22/21 1430  PainSc: 7    There is no height or weight on file to calculate BMI.  Advanced Directives 04/22/2021 04/10/2021 10/10/2020 04/09/2020 10/04/2019 07/06/2019 06/05/2019  Does Patient Have a Medical Advance Directive? No Yes No Yes Yes Yes Yes  Type of Advance Directive - [place];Living will - [place];Living will [place];Living will - -  Does patient want to make changes to medical advance directive? - - - No - Patient declined - - -  Copy of [place] in [place]  Would patient like information on creating a medical advance directive? No - Patient declined - No - Patient declined - - - -    Current Medications (verified) Outpatient Encounter Medications as of 04/22/2021  Medication Sig   acetaminophen (TYLENOL) 325 MG tablet Take 650 mg by mouth every 6 (six) hours as needed.   ALPRAZolam (XANAX) 1 MG tablet TAKE 1 TO 2 TABLETS BY MOUTH TWICE DAILY AS NEEDED.   Azelastine HCl (ASTEPRO) 0.15 % SOLN 2 sprays each nostril every 12 hours   calcium carbonate (OSCAL) 1500 (600 Ca) MG TABS tablet Take by mouth daily.   Cholecalciferol (VITAMIN D3) 5000 units CAPS Take 4 capsules by mouth daily.    cyclobenzaprine (FLEXERIL) 5 MG tablet Take 5 mg by mouth at bedtime.   diclofenac  Sodium (VOLTAREN) 1 % GEL Apply 4 g topically 4 (four) times daily.   doxylamine, Sleep, (UNISOM) 25 MG tablet Take 25 mg by mouth at bedtime as needed.   hydrocortisone 2.5 % cream Apply 1 application topically as needed. RECTAL ITCHING   levothyroxine (SYNTHROID) 75 MCG tablet Take 1 tablet (75 mcg total) by mouth daily.   meloxicam (MOBIC) 7.5 MG tablet Take 2 tablets by mouth once daily (Patient taking differently: 7.5 mg daily.)   metoprolol tartrate (LOPRESSOR) 25 MG tablet Take 1 tablet (25 mg total) by mouth 2 (two) times daily.   Multiple Vitamins-Iron (MULTIVITAMIN/IRON PO) Take 1 tablet by mouth daily.   NIACIN PO Take by mouth daily.    omeprazole (PRILOSEC) 40 MG capsule Take 1 capsule (40 mg total) by mouth daily.   Oxycodone HCl 10 MG TABS 10 mg every 4 (four) hours as needed.    promethazine (PHENERGAN) 25 MG tablet TAKE ONE TABLET BY MOUTH EVERY 6 HOURS AS NEEDED FOR NAUSEA AND VOMITING   simvastatin (ZOCOR) 20 MG tablet Take 1 tablet (20 mg total) by mouth at bedtime.   Specialty Vitamins Products (MAGNESIUM, AMINO ACID CHELATE,) 133 MG tablet Take 1 tablet by mouth daily.   tolterodine (DETROL LA) 4 MG 24 hr capsule    traZODone (DESYREL) 50 MG tablet Take 1 tablet by mouth at bedtime.   No facility-administered encounter medications on file as of 04/22/2021.    Allergies (verified) Cymbalta [duloxetine hcl],  Iohexol, Mepergan [meperidine-promethazine], Morphine, Augmentin [amoxicillin-pot clavulanate], Citalopram, Hctz [hydrochlorothiazide], Lexapro [escitalopram oxalate], Lyrica [pregabalin], Promethazine hcl, and Valsartan   History: Past Medical History:  Diagnosis Date   Allergic rhinitis    Anxiety    Chest pain    cr   Chronic gastritis    dx'd by EGD 2018 (erosive, likely NSAID-induced). 06/2019 +gastritis.   Chronic pain syndrome    right knee osteo, hx of TKA in R knee.  Pt was told by [name] that no further surgical options are available for her; also,  chronic lower back pain.--[name].   Chronic renal insufficiency, stage II (mild) 2015   vs acute fluctuations depending on hydration?---GFR from 60s up to 80s.   Diverticulosis of colon    with hx of 'itis   DJD (degenerative joint disease)    L spine and knees.  R knee inject 03/15/18, L knee inject 12/26/18 (by Dr. Lynann Bologna).   Epigastric pain    As of 01/2017, GI (Dr. Loletha Carrow) planned EGD.   FATIGUE / MALAISE 07/01/2009   Chronic   GERD (gastroesophageal reflux disease)    History of colonic polyps    HTN (hypertension)    Hypercholesterolemia    recommended restart of simvastatin 07/2018   Hypothyroid 07/05/2012   IBS (irritable bowel syndrome)    IC (interstitial cystitis)    [name] at [facility]   Iron deficiency anemia, unspecified 06/27/2013   Has required IV iron on multiple occasions ([name]).  Hemoccults neg 12/2016. Most recent hem f/u labs normal 09/2020.   Lumbar back pain    Spinal stenosis, lumbar region with neurogenic claudication L5-S1 sx's--ESI's no help.  CT L spine stable post-surg appearance as of 12/2019   Major depression, recurrent ([place])    vs dysthymia,chronic   Memory impairment 10/03/2012   Memory impairment 10/03/2012 >referral to neuro     Monoclonal gammopathy of undetermined significance    IgM kappa MGUS ([name]) routine f/u has shown stability of this problem (most recent f/u 09/2020)   Neuromuscular disorder ([place])    OSA (obstructive sleep apnea)    Latest sleep study 01/2017.  Some adjustments to CPAP being made+ dental referral for consideration of oral appliance --per pulm MD.   Osteopenia 04/2014; 03/2018   2015: T score -2.0 FRAX 11%/1.8%.  2019 FRAX 10%/1.5%.   Otitis externa, candida 93/2671   [name] tx'd her with clotrimazole 1% external solution x 1 mo   Palpitations    Psoriasis    Trochanteric bursitis of both hips    + injections by ortho in the past   Vertigo 2020/21   6+ wks, ->to ENT->?vestib  neuronitis with ongoing motion sensitivity, gradually resolving.([name] 2/45/80). MRI brain normal 09/2020.   Vitamin D deficiency    Xerostomia    and xerophthalmia--per [name] (he suggests SSA, SSB, ESR, rh factor, ANA, and antimicrosmal and antithyroglobulin ab's as of 02/17/16--all these labs were NORMAL.   Past Surgical History:  Procedure Laterality Date   ABDOMINAL HYSTERECTOMY     for prolapse with abdominal incision   BACK SURGERY  01/23/2015   Lumbar decompression with L2-L5 fusion ([name])   [place]     no PCI   CHOLECYSTECTOMY     COLONOSCOPY  04/2006;07/16/19   Diverticulosis, o/w normal.  06/2019->2 adenomas, +Diverticulosis.  Recall 2023-2025.   DEXA  04/06/2018   T score -1.5 femur neck, -2.2 radius.  FRAX 10%/1.5%.  Repeat DEXA 2 yrs.  ESOPHAGOGASTRODUODENOSCOPY  05/29/2009; 03/01/17;07/16/19   Esoph normal.  Gastritis (H pylori NEG) + gastric polyps (benign).  Duodenal biopsy NORMAL.  2018-mild chronic gastritis, H pylori NEG. 06/2019 tortuous esophagus w/out stenosis +chronic gastritis H pyl neg.   EYE SURGERY Left    "lazy eye"   HAND SURGERY Right    TRIGGER FINGER; index finger   HERNIA REPAIR     umbilical   KNEE ARTHROSCOPY     RIGHT KNEE X 5   LAMINOTOMY  2010   L-4, L-5 FUSION   NASAL SINUS SURGERY     polypectomy (remote past)   OOPHORECTOMY  2010   Laparoscopic BSO (path benign)   PATELLA FRACTURE SURGERY  10-08   [name]   POSTERIOR LAMINECTOMY / DECOMPRESSION LUMBAR SPINE  9-09   [name]   TOTAL KNEE ARTHROPLASTY  12-07 and 4-07   [name], revision 12-09 [name] at [facility] History  Problem Relation Age of Onset   Leukemia Brother    Lung disease Brother    Thyroid disease Brother    Melanoma Brother    Hypertension Mother    Thyroid disease Mother    Hypertension Father    Breast cancer Sister        Age 70's   Hypertension Sister    Coronary artery disease Sister    Lupus Sister    Thyroid disease  Sister    Stomach cancer Brother    Cancer Other        "all over"   Parkinson's disease Sister    Colon cancer Neg Hx    Esophageal cancer Neg Hx    Rectal cancer Neg Hx    Social History   Socioeconomic History   Marital status: Married    Spouse name: Dietitian   Number of children: 0   Years of education: Not on file   Highest education level: Not on file  Occupational History    Employer: RETIRED  Tobacco Use   Smoking status: Never   Smokeless tobacco: Never   Tobacco comments:    NEVER USED TOBACCO  Vaping Use   Vaping Use: Never used  Substance and Sexual Activity   Alcohol use: No    Alcohol/week: 0.0 standard drinks   Drug use: No   Sexual activity: Not Currently    Birth control/protection: Surgical    Comment: 1st intercourse 75 yo-Fewer than 5 partners  Other Topics Concern   Not on file  Social History Narrative   Married, no children.   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   No Tob.  No alc.  No drugs.   Orig from [place].   Social Determinants of Health   Financial Resource Strain: Low Risk    Difficulty of Paying Living Expenses: Not hard at all  Food Insecurity: No Food Insecurity   Worried About Charity fundraiser in the Last Year: Never true   [place] in the Last Year: Never true  Transportation Needs: No Transportation Needs   Lack of Transportation (Medical): No   Lack of Transportation (Non-Medical): No  Physical Activity: Inactive   Days of Exercise per Week: 0 days   Minutes of Exercise per Session: 0 min  Stress: Stress Concern Present   Feeling of Stress : To some extent  Social Connections: Moderately Integrated   Frequency of Communication with Friends and Family: More than three times a week   Frequency of Social Gatherings with Friends and Family:  More than three times a week   Attends Religious Services: More than 4 times per year   Active Member of Clubs or Organizations: No   Attends Theatre manager Meetings: Never   Marital Status: Married    Tobacco Counseling Counseling given: Not Answered Tobacco comments: NEVER USED TOBACCO   Clinical Intake:  Pre-visit preparation completed: No  Pain : 0-10 Pain Score: 7  Pain Location: Leg Pain Orientation: Left Pain Descriptors / Indicators: Aching, Burning, Constant, Sore Pain Onset: More than a month ago Pain Frequency: Intermittent Pain Relieving Factors: oxycodone Effect of Pain on Daily Activities: yes  Pain Relieving Factors: oxycodone  Nutritional Risks: None Diabetes: No  How often do you need to have someone help you when you read instructions, pamphlets, or other written materials from your doctor or pharmacy?: 1 - Never  Diabetic?No  Interpreter Needed?: No  Information entered by :: Leroy Kennedy LPN   Activities of Daily Living In your present state of health, do you have any difficulty performing the following activities: 04/22/2021 09/08/2020  Hearing? N N  Vision? N N  Difficulty concentrating or making decisions? N N  Walking or climbing stairs? Y N  Dressing or bathing? N N  Doing errands, shopping? N N  Preparing Food and eating ? N -  Using the Toilet? N -  In the past six months, have you accidently leaked urine? N -  Do you have problems with loss of bowel control? N -  Managing your Medications? N -  Managing your Finances? N -  Housekeeping or managing your Housekeeping? N -  Some recent data might be hidden    Patient Care Team: [name], MD as PCP - General (Family Medicine) Chesley Mires, MD as Consulting Physician (Pulmonary Disease) Bjorn Loser, MD as Consulting Physician (Urology) Bo Merino, MD as Consulting Physician (Rheumatology) Fontaine, Belinda Block, MD (Inactive) as Consulting Physician (Gynecology) Almedia Balls, MD as Consulting Physician (Orthopedic Surgery) [name], MD as Consulting Physician (Oncology) Jodi Marble, MD as  Consulting Physician (Otolaryngology) Renato Shin, MD as Consulting Physician (Endocrinology) [place], Holli Humbles, NP as Nurse Practitioner (Hematology and Oncology) Macarthur Critchley, [place] as Referring Physician (Optometry) Lacretia Leigh, DMD (Dentistry) Deneise Lever, MD as Consulting Physician (Pulmonary Disease) Loletha Carrow Kirke Corin, MD as Consulting Physician (Gastroenterology) Margaretha Sheffield, MD as Consulting Physician (Physical Medicine and Rehabilitation) Thurman Coyer, DO as Consulting Physician (Sports Medicine) Earnie Larsson, MD as Consulting Physician (Neurosurgery)  Indicate any recent [place] you may have received from other than [facility] providers in the past year (date may be approximate).     Assessment:   This is a routine wellness examination for [name].  Hearing/Vision screen Hearing Screening - Comments:: No trouble hearing Vision Screening - Comments:: [facility] Up to date  Dietary issues and exercise activities discussed: Current Exercise Habits: The patient does not participate in regular exercise at present, Intensity: Not Applicable   Goals Addressed             This Visit's Progress    Patient Stated   On track    Decrease Dr. Malachi Bonds intake.       Patient Stated       Would like to  loose some  weight       Depression Screen PHQ 2/9 Scores 04/22/2021 04/21/2021 12/26/2019 01/25/2019 07/24/2018 02/27/2018 01/20/2017  PHQ - 2 Score $Remov'3 3 2 3 5 1 3  'kMrVMw$ PHQ- 9 Score 11 10  $'6 11 17 11 8    'V$ Fall Risk Fall Risk  04/22/2021 09/08/2020 01/25/2019 02/27/2018 01/23/2018  Falls in the past year? 0 1 1 No No  Number falls in past yr: 0 0 1 - -  Injury with Fall? 0 0 1 - -  Follow up Falls evaluation completed;Falls prevention discussed Falls evaluation completed Falls evaluation completed - -    FALL RISK PREVENTION PERTAINING TO THE HOME:  Any stairs in or around the home? Yes  If so, are there any without handrails? Yes  Home free of loose throw rugs in  walkways, pet beds, electrical cords, etc? Yes  Adequate lighting in your home to reduce risk of falls? Yes   ASSISTIVE DEVICES UTILIZED TO PREVENT FALLS:  Life alert? Yes  Use of a cane, walker or w/c? No  Grab bars in the bathroom? Yes  Shower chair or bench in shower? No  Elevated toilet seat or a handicapped toilet? No   TIMED UP AND GO:  Was the test performed? No .    Cognitive Function:  Normal cognitive status assessed by direct observation by this Nurse Health Advisor. No abnormalities found.         Immunizations Immunization History  Administered Date(s) Administered   Influenza Split 08/02/2011, 08/16/2012, 09/18/2013   Influenza Whole 07/23/2009, 07/29/2010   Influenza, High Dose Seasonal PF 09/10/2015, 08/02/2016   Influenza,inj,Quad PF,6+ Mos 06/19/2014   PFIZER(Purple Top)SARS-COV-2 Vaccination 11/18/2019, 12/18/2019   Pneumococcal Polysaccharide-23 07/24/2018   Pneumococcal-Unspecified 09/20/2009   Zoster, Live 06/21/2011    TDAP status: Due, Education has been provided regarding the importance of this vaccine. Advised may receive this vaccine at local pharmacy or Health Dept. Aware to provide a copy of the vaccination record if obtained from local pharmacy or Health Dept. Verbalized acceptance and understanding.  Flu Vaccine status: Due, Education has been provided regarding the importance of this vaccine. Advised may receive this vaccine at local pharmacy or Health Dept. Aware to provide a copy of the vaccination record if obtained from local pharmacy or Health Dept. Verbalized acceptance and understanding.  Pneumococcal vaccine status: Up to date  Covid-19 vaccine status: Information provided on how to obtain vaccines.   Qualifies for Shingles Vaccine? Yes   Zostavax completed Yes   Shingrix Completed?: No.    Education has been provided regarding the importance of this vaccine. Patient has been advised to call insurance company to determine out of  pocket expense if they have not yet received this vaccine. Advised may also receive vaccine at local pharmacy or Health Dept. Verbalized acceptance and understanding.  Screening Tests Health Maintenance  Topic Date Due   Hepatitis C Screening  Never done   Zoster Vaccines- Shingrix (1 of 2) Never done   TETANUS/TDAP  04/23/2019   PNA vac Low Risk Adult (2 of 2 - PCV13) 07/25/2019   INFLUENZA VACCINE  04/20/2021   COVID-19 Vaccine (3 - Pfizer risk series) 05/07/2021 (Originally 01/15/2020)   MAMMOGRAM  10/27/2022   DEXA SCAN  Completed   HPV VACCINES  Aged Out    Health Maintenance  Health Maintenance Due  Topic Date Due   Hepatitis C Screening  Never done   Zoster Vaccines- Shingrix (1 of 2) Never done   TETANUS/TDAP  04/23/2019   PNA vac Low Risk Adult (2 of 2 - PCV13) 07/25/2019   INFLUENZA VACCINE  04/20/2021    Colorectal cancer screening: Type of screening: Colonoscopy. Completed  . Repeat every no longer required years  Mammogram status: Completed  . Repeat every year  Bone Density status: Ordered  . Pt provided with contact info and advised to call to schedule appt.  Lung Cancer Screening: (Low Dose CT Chest recommended if Age 33-80 years, 30 pack-year currently smoking OR have quit w/in 15years.) does not qualify.   Lung Cancer Screening Referral:    Additional Screening:  Hepatitis C Screening: does not qualify; Completed   Vision Screening: Recommended annual ophthalmology exams for early detection of glaucoma and other disorders of the eye. Is the patient up to date with their annual eye exam?  Yes  Who is the provider or what is the name of the office in which the patient attends annual eye exams? Dr. [facility] If pt is not established with a provider, would they like to be referred to a provider to establish care? No .   Dental Screening: Recommended annual dental exams for proper oral hygiene  Community Resource Referral / Chronic Care  Management: CRR required this visit?  No   CCM required this visit?  No      Plan:     I have personally reviewed and noted the following in the patient's chart:   Medical and social history Use of alcohol, tobacco or illicit drugs  Current medications and supplements including opioid prescriptions.  Functional ability and status Nutritional status Physical activity Advanced directives List of other physicians Hospitalizations, surgeries, and ER visits in previous 12 months Vitals Screenings to include cognitive, depression, and falls Referrals and appointments  In addition, I have reviewed and discussed with patient certain preventive protocols, quality metrics, and best practice recommendations. A written personalized care plan for preventive services as well as general preventive health recommendations were provided to patient.     Leroy Kennedy, LPN   12/28/4471   Nurse Notes: na

## 2021-04-23 ENCOUNTER — Telehealth: Payer: Self-pay | Admitting: Family Medicine

## 2021-04-23 MED ORDER — MECLIZINE HCL 25 MG PO TABS: 25.0000 mg | ORAL_TABLET | Freq: Three times a day (TID) | ORAL | 1 refills | Status: DC | PRN

## 2021-04-23 NOTE — Telephone Encounter (Signed)
Patient requesting new RX for meclizine. States she has a bottle from 2017 but would like a new prescription. She was seen 2 days ago but stated she just woke up with the dizziness (recurrent problem) this morning. Please call patient to advise.

## 2021-04-23 NOTE — Telephone Encounter (Signed)
Spoke with patient regarding new rx,voiced understanding.

## 2021-04-23 NOTE — Telephone Encounter (Signed)
Pt has hx of vertigo, meclizine not on current med list but has been on the medication in the past.  Please Advise.

## 2021-04-23 NOTE — Telephone Encounter (Signed)
OK. Meclizine eRx'd to [facility]

## 2021-05-01 ENCOUNTER — Ambulatory Visit (INDEPENDENT_AMBULATORY_CARE_PROVIDER_SITE_OTHER): Payer: Medicare Other | Admitting: Family Medicine

## 2021-05-01 ENCOUNTER — Encounter: Payer: Self-pay | Admitting: Family Medicine

## 2021-05-01 ENCOUNTER — Other Ambulatory Visit: Payer: Self-pay

## 2021-05-01 VITALS — BP 126/78 | HR 66 | Temp 97.4°F | Ht 66.0 in | Wt 201.2 lb

## 2021-05-01 DIAGNOSIS — R110 Nausea: Secondary | ICD-10-CM

## 2021-05-01 DIAGNOSIS — H8113 Benign paroxysmal vertigo, bilateral: Secondary | ICD-10-CM | POA: Diagnosis not present

## 2021-05-01 MED ORDER — ONDANSETRON HCL 8 MG PO TABS: 8.0000 mg | ORAL_TABLET | Freq: Three times a day (TID) | ORAL | 3 refills | Status: DC | PRN

## 2021-05-01 NOTE — Progress Notes (Signed)
OFFICE VISIT  05/01/2021  CC:  Chief Complaint  Patient presents with   Dizziness    X8 days;Hx of vertigo, has rx for meclizine and doing elepy maneuver exercises at home. Lying down makes it worse and feels swimmy headed.    HPI:    Patient is a 75 y.o. Caucasian female who presents for dizziness. Onset upon waking up 1 wk ago, sensation of everything moving (but not exactly "spinning") when she turns head L or R, bad nausea.  No vomiting or diarrhea.  No fever.  Some generalized "pressure" in head but no HA.  Sx's not orthostatic.  The symptoms are a little better gradually since onset but she still feels miserable. Has chronic bilat tinnitus--like "cricket" sound.  No change in hearing.  No focal weakness, slurred speech, sensory complaints, or cognitive changes.  No falls. She is taking meclizine regularly since onset and not really helping.  Has been trying some BPPV repositioning maneuvers at home.  Pt with signif PMH of vertigo but she had more of the classic "room spinning" sensation with past->see PMH section below for details.   ROS as above, plus--> no fevers, no CP, no SOB, no wheezing, no cough, no rashes, no melena/hematochezia.  No polyuria or polydipsia.    No dysuria or unusual/new urinary urgency or frequency.  No recent changes in lower legs. No abd pain.  No palpitations.    Past Medical History:  Diagnosis Date   Allergic rhinitis    Anxiety    Chest pain    cr   Chronic gastritis    dx'd by EGD 2018 (erosive, likely NSAID-induced). 06/2019 +gastritis.   Chronic pain syndrome    right knee osteo, hx of TKA in R knee.  Pt was told by [name] that no further surgical options are available for her; also, chronic lower back pain.--[name].   Chronic renal insufficiency, stage II (mild) 2015   vs acute fluctuations depending on hydration?---GFR from 60s up to 80s.   Diverticulosis of colon    with hx of 'itis   DJD (degenerative joint disease)     L spine and knees.  R knee inject 03/15/18, L knee inject 12/26/18 (by Dr. Lynann Bologna).   Epigastric pain    As of 01/2017, GI (Dr. Loletha Carrow) planned EGD.   FATIGUE / MALAISE 07/01/2009   Chronic   GERD (gastroesophageal reflux disease)    History of colonic polyps    HTN (hypertension)    Hypercholesterolemia    recommended restart of simvastatin 07/2018   Hypothyroid 07/05/2012   IBS (irritable bowel syndrome)    IC (interstitial cystitis)    [name] at [facility]   Iron deficiency anemia, unspecified 06/27/2013   Has required IV iron on multiple occasions ([name]).  Hemoccults neg 12/2016. Most recent hem f/u labs normal 09/2020.   Lumbar back pain    Spinal stenosis, lumbar region with neurogenic claudication L5-S1 sx's--ESI's no help.  CT L spine stable post-surg appearance as of 12/2019   Major depression, recurrent ([place])    vs dysthymia,chronic   Memory impairment 10/03/2012   Memory impairment 10/03/2012 >referral to neuro     Monoclonal gammopathy of undetermined significance    IgM kappa MGUS ([name]) routine f/u has shown stability of this problem (most recent f/u 09/2020)   Neuromuscular disorder ([place])    OSA (obstructive sleep apnea)    Latest sleep study 01/2017.  Some adjustments to CPAP being made+ dental referral for  consideration of oral appliance --per pulm MD.   Osteopenia 04/2014; 03/2018   2015: T score -2.0 FRAX 11%/1.8%.  2019 FRAX 10%/1.5%.   Otitis externa, candida 24/2353   [name] tx'd her with clotrimazole 1% external solution x 1 mo   Palpitations    Psoriasis    Trochanteric bursitis of both hips    + injections by ortho in the past   Vertigo 2020/21   6+ wks, ->to ENT->?vestib neuronitis with ongoing motion sensitivity, gradually resolving.([name] 03/03/42). MRI brain normal 09/2020.   Vitamin D deficiency    Xerostomia    and xerophthalmia--per [name] (he suggests SSA, SSB, ESR, rh factor, ANA, and antimicrosmal and antithyroglobulin  ab's as of 02/17/16--all these labs were NORMAL.    Past Surgical History:  Procedure Laterality Date   ABDOMINAL HYSTERECTOMY     for prolapse with abdominal incision   BACK SURGERY  01/23/2015   Lumbar decompression with L2-L5 fusion ([name])   [place]     no PCI   CHOLECYSTECTOMY     COLONOSCOPY  04/2006;07/16/19   Diverticulosis, o/w normal.  06/2019->2 adenomas, +Diverticulosis.  Recall 2023-2025.   DEXA  04/06/2018   T score -1.5 femur neck, -2.2 radius.  FRAX 10%/1.5%.  Repeat DEXA 2 yrs.   ESOPHAGOGASTRODUODENOSCOPY  05/29/2009; 03/01/17;07/16/19   Esoph normal.  Gastritis (H pylori NEG) + gastric polyps (benign).  Duodenal biopsy NORMAL.  2018-mild chronic gastritis, H pylori NEG. 06/2019 tortuous esophagus w/out stenosis +chronic gastritis H pyl neg.   EYE SURGERY Left    "lazy eye"   HAND SURGERY Right    TRIGGER FINGER; index finger   HERNIA REPAIR     umbilical   KNEE ARTHROSCOPY     RIGHT KNEE X 5   LAMINOTOMY  2010   L-4, L-5 FUSION   NASAL SINUS SURGERY     polypectomy (remote past)   OOPHORECTOMY  2010   Laparoscopic BSO (path benign)   PATELLA FRACTURE SURGERY  10-08   [name]   POSTERIOR LAMINECTOMY / DECOMPRESSION LUMBAR SPINE  9-09   [name]   TOTAL KNEE ARTHROPLASTY  12-07 and 4-07   [name], revision 12-09 [name] at [facility]    Outpatient Medications Prior to Visit  Medication Sig Dispense Refill   acetaminophen (TYLENOL) 325 MG tablet Take 650 mg by mouth every 6 (six) hours as needed.     ALPRAZolam (XANAX) 1 MG tablet TAKE 1 TO 2 TABLETS BY MOUTH TWICE DAILY AS NEEDED. 120 tablet 1   Azelastine HCl (ASTEPRO) 0.15 % SOLN 2 sprays each nostril every 12 hours 30 mL 11   calcium carbonate (OSCAL) 1500 (600 Ca) MG TABS tablet Take by mouth daily.     Cholecalciferol (VITAMIN D3) 5000 units CAPS Take 4 capsules by mouth daily.      cyclobenzaprine (FLEXERIL) 5 MG tablet Take 5 mg by mouth at bedtime.     diclofenac Sodium (VOLTAREN)  1 % GEL Apply 4 g topically 4 (four) times daily.     doxylamine, Sleep, (UNISOM) 25 MG tablet Take 25 mg by mouth at bedtime as needed.     hydrocortisone 2.5 % cream Apply 1 application topically as needed. RECTAL ITCHING     levothyroxine (SYNTHROID) 75 MCG tablet Take 1 tablet (75 mcg total) by mouth daily. 90 tablet 3   meclizine (ANTIVERT) 25 MG tablet Take 1 tablet (25 mg total) by mouth 3 (three) times daily as needed for dizziness. [place]  tablet 1   meloxicam (MOBIC) 7.5 MG tablet Take 2 tablets by mouth once daily (Patient taking differently: 7.5 mg daily.) 180 tablet 0   metoprolol tartrate (LOPRESSOR) 25 MG tablet Take 1 tablet (25 mg total) by mouth 2 (two) times daily. 180 tablet 3   Multiple Vitamins-Iron (MULTIVITAMIN/IRON PO) Take 1 tablet by mouth daily.     NIACIN PO Take by mouth daily.      omeprazole (PRILOSEC) 40 MG capsule Take 1 capsule (40 mg total) by mouth daily. 30 capsule 3   Oxycodone HCl 10 MG TABS 10 mg every 4 (four) hours as needed.      promethazine (PHENERGAN) 25 MG tablet TAKE ONE TABLET BY MOUTH EVERY 6 HOURS AS NEEDED FOR NAUSEA AND VOMITING 30 tablet 1   simvastatin (ZOCOR) 20 MG tablet Take 1 tablet (20 mg total) by mouth at bedtime. 90 tablet 3   Specialty Vitamins Products (MAGNESIUM, AMINO ACID CHELATE,) 133 MG tablet Take 1 tablet by mouth daily.     tolterodine (DETROL LA) 4 MG 24 hr capsule      traZODone (DESYREL) 50 MG tablet Take 1 tablet by mouth at bedtime.  2   No facility-administered medications prior to visit.    Allergies  Allergen Reactions   Cymbalta [Duloxetine Hcl]     Facial edema   Iohexol      Code: HIVES, Desc: pt states her face and throat swells when administered IV contrast - KB, Onset Date: [id number]    Mepergan [Meperidine-Promethazine]     Cardiac arrest   Morphine Rash   Augmentin [Amoxicillin-Pot Clavulanate] Nausea Only   Citalopram Other (See Comments)    Jittery/heart racing    Hctz [Hydrochlorothiazide] Other  (See Comments)    Too much urinating, felt dried out, etc--NO ALLERGY   Lexapro [Escitalopram Oxalate] Other (See Comments)    Jittery,heart racing   Lyrica [Pregabalin] Swelling    Face, hands, fingers, ankles   Promethazine Hcl    Valsartan     REACTION: pt states INTOL to Diovan    ROS As per HPI  PE: Vitals with BMI 05/01/2021 04/22/2021 04/21/2021  Height $Remov'5\' 6"'OjLpaS$  (No Data) $RemoveB'5\' 6"'zFUpscfq$   Weight 201 lbs 3 oz (No Data) 200 lbs 13 oz  BMI 23.53 - 61.44  Systolic 315 (No Data) 400  Diastolic 78 (No Data) 76  Pulse 66 - 57   Gen: Alert, tired-appearing but NAD.  Patient is oriented to person, place, time, and situation.  Lucid thought and speech. ENT: Ears: EACs clear, normal epithelium.  TMs with good light reflex and landmarks bilaterally.  Eyes: no injection, icteris, swelling, or exudate.  EOMI, PERRLA.  No nystagmus. Nose: no drainage or turbinate edema/swelling.  No injection or focal lesion.  Mouth: lips without lesion/swelling.  Oral mucosa pink and moist.  Dentition intact and without obvious caries or gingival swelling.  Oropharynx without erythema, exudate, or swelling.  Neck: no bruits. CV: RRR, no m/r/g.   LUNGS: CTA bilat, nonlabored resps, good aeration in all lung fields. EXT: no clubbing or cyanosis.  no edema.  Neuro: CN 2-12 intact bilaterally, strength 5/5 in proximal and distal upper extremities and lower extremities bilaterally.  No sensory deficits.  No tremor.  No disdiadochokinesis.  No ataxia.  She walks slowly and appears a little unsteady.  No broad based gait, no veering, no shuffling.  Upper extremity and lower extremity DTRs symmetric.  No pronator drift.  LABS:  Lab Results  Component Value Date  WBC 8.3 04/10/2021   HGB 14.2 04/10/2021   HCT 42.9 04/10/2021   MCV 87.7 04/10/2021   PLT 207 04/10/2021     Chemistry      Component Value Date/Time   NA 138 04/10/2021 1156   NA 138 03/25/2017 1333   NA 138 11/25/2016 1147   K 4.2 04/10/2021 1156   K 4.3  03/25/2017 1333   K 3.8 11/25/2016 1147   CL 104 04/10/2021 1156   CL 104 03/25/2017 1333   CL 103 01/15/2010 0835   CO2 27 04/10/2021 1156   CO2 24 03/25/2017 1333   CO2 25 11/25/2016 1147   BUN 25 (H) 04/10/2021 1156   BUN 12 03/25/2017 1333   BUN 15.3 11/25/2016 1147   CREATININE 0.88 04/10/2021 1156   CREATININE 0.76 03/25/2017 1333   CREATININE 0.8 11/25/2016 1147      Component Value Date/Time   CALCIUM 9.4 04/10/2021 1156   CALCIUM 9.5 03/25/2017 1333   CALCIUM 9.2 11/25/2016 1147   ALKPHOS 89 04/10/2021 1156   ALKPHOS 111 03/25/2017 1333   ALKPHOS 99 11/25/2016 1147   AST 12 (L) 04/10/2021 1156   AST 24 11/25/2016 1147   ALT 16 04/10/2021 1156   ALT 30 11/25/2016 1147   BILITOT 0.5 04/10/2021 1156   BILITOT 0.61 11/25/2016 1147     Lab Results  Component Value Date   TSH 2.26 10/09/2020   IMPRESSION AND PLAN:  BPPV, bilat.  Gradually improving over the course of 1 week but still miserable. I have low suspicion of CVA, mass, or infection. Says phenergan has not been helpful for nausea in the past so we'll try zofran $RemoveBe'8mg'svtMokXRg$  tid prn.  Also ok to d/c meclizine since not helpful. Referral to PT for vestibular rehab ordered today.  She do home epley maneuvers in the meantime.  An After Visit Summary was printed and given to the patient.  FOLLOW UP: Return for 7-10d f/u vertigo.  Signed:  Crissie Sickles, MD           05/01/2021

## 2021-05-05 ENCOUNTER — Other Ambulatory Visit: Payer: Self-pay

## 2021-05-05 ENCOUNTER — Ambulatory Visit (INDEPENDENT_AMBULATORY_CARE_PROVIDER_SITE_OTHER): Payer: Medicare Other | Admitting: Endocrinology

## 2021-05-05 VITALS — BP 110/62 | HR 59 | Ht 66.0 in | Wt 199.6 lb

## 2021-05-05 DIAGNOSIS — E039 Hypothyroidism, unspecified: Secondary | ICD-10-CM | POA: Diagnosis not present

## 2021-05-05 LAB — T4, FREE: Free T4: 1.23 ng/dL (ref 0.60–1.60)

## 2021-05-05 LAB — TSH: TSH: 1.31 u[IU]/mL (ref 0.35–5.50)

## 2021-05-05 NOTE — Patient Instructions (Addendum)
Blood tests are requested for you today.  We'll let you know about the results.   It is best to never miss the levothyroxine.  However, if you do miss it, next best is to double up the next time.   Please come back for a follow-up appointment in 6 months.

## 2021-05-05 NOTE — Progress Notes (Signed)
Subjective:    Patient ID: [name], female    DOB: Sep 24, 1945, 75 y.o.   MRN: [id number]  HPI Pt returns for f/u of chronic primary hypothyroidism (dx'ed 2010; [place] (2015) was normal).  she sees PCP for vertigo.  She has weight gain. She takes synthroid as rx'ed.   Past Medical History:  Diagnosis Date   Allergic rhinitis    Anxiety    Chest pain    cr   Chronic gastritis    dx'd by EGD 2018 (erosive, likely NSAID-induced). 06/2019 +gastritis.   Chronic pain syndrome    right knee osteo, hx of TKA in R knee.  Pt was told by [name] that no further surgical options are available for her; also, chronic lower back pain.--[name].   Chronic renal insufficiency, stage II (mild) 2015   vs acute fluctuations depending on hydration?---GFR from 60s up to 80s.   Diverticulosis of colon    with hx of 'itis   DJD (degenerative joint disease)    L spine and knees.  R knee inject 03/15/18, L knee inject 12/26/18 (by Dr. Charlett Blake).   Epigastric pain    As of 01/2017, GI (Dr. Myrtie Neither) planned EGD.   FATIGUE / MALAISE 07/01/2009   Chronic   GERD (gastroesophageal reflux disease)    History of colonic polyps    HTN (hypertension)    Hypercholesterolemia    recommended restart of simvastatin 07/2018   Hypothyroid 07/05/2012   IBS (irritable bowel syndrome)    IC (interstitial cystitis)    [name] at [facility]   Iron deficiency anemia, unspecified 06/27/2013   Has required IV iron on multiple occasions (Dr. Myna Hidalgo).  Hemoccults neg 12/2016. Most recent hem f/u labs normal 09/2020.   Lumbar back pain    Spinal stenosis, lumbar region with neurogenic claudication L5-S1 sx's--ESI's no help.  CT L spine stable post-surg appearance as of 12/2019   Major depression, recurrent ([facility])    vs dysthymia,chronic   Memory impairment 10/03/2012   Memory impairment 10/03/2012 >referral to neuro     Monoclonal gammopathy of undetermined significance    IgM kappa MGUS (Dr. Myna Hidalgo)  routine f/u has shown stability of this problem (most recent f/u 09/2020)   Neuromuscular disorder ([facility])    OSA (obstructive sleep apnea)    Latest sleep study 01/2017.  Some adjustments to CPAP being made+ dental referral for consideration of oral appliance --per pulm MD.   Osteopenia 04/2014; 03/2018   2015: T score -2.0 FRAX 11%/1.8%.  2019 FRAX 10%/1.5%.   Otitis externa, candida 07/2015   Dr. Lazarus Salines tx'd her with clotrimazole 1% external solution x 1 mo   Palpitations    Psoriasis    Trochanteric bursitis of both hips    + injections by ortho in the past   Vertigo 2020/21   6+ wks, ->to ENT->?vestib neuronitis with ongoing motion sensitivity, gradually resolving.(Dr. Lazarus Salines 10/18/19). MRI brain normal 09/2020.   Vitamin D deficiency    Xerostomia    and xerophthalmia--per Dr. Lazarus Salines (he suggests SSA, SSB, ESR, rh factor, ANA, and antimicrosmal and antithyroglobulin ab's as of 02/17/16--all these labs were NORMAL.    Past Surgical History:  Procedure Laterality Date   ABDOMINAL HYSTERECTOMY     for prolapse with abdominal incision   BACK SURGERY  01/23/2015   Lumbar decompression with L2-L5 fusion (Dr. Dutch Quint)   CARDIAC CATHETERIZATION     no PCI   CHOLECYSTECTOMY     COLONOSCOPY  04/2006;07/16/19  Diverticulosis, o/w normal.  06/2019->2 adenomas, +Diverticulosis.  Recall 2023-2025.   DEXA  04/06/2018   T score -1.5 femur neck, -2.2 radius.  FRAX 10%/1.5%.  Repeat DEXA 2 yrs.   ESOPHAGOGASTRODUODENOSCOPY  05/29/2009; 03/01/17;07/16/19   Esoph normal.  Gastritis (H pylori NEG) + gastric polyps (benign).  Duodenal biopsy NORMAL.  2018-mild chronic gastritis, H pylori NEG. 06/2019 tortuous esophagus w/out stenosis +chronic gastritis H pyl neg.   EYE SURGERY Left    "lazy eye"   HAND SURGERY Right    TRIGGER FINGER; index finger   HERNIA REPAIR     umbilical   KNEE ARTHROSCOPY     RIGHT KNEE X 5   LAMINOTOMY  2010   L-4, L-5 FUSION   NASAL SINUS SURGERY     polypectomy (remote  past)   OOPHORECTOMY  2010   Laparoscopic BSO (path benign)   PATELLA FRACTURE SURGERY  10-08   [name]   POSTERIOR LAMINECTOMY / DECOMPRESSION LUMBAR SPINE  9-09   [name]   TOTAL KNEE ARTHROPLASTY  12-07 and 4-07   [name], revision 12-09 [name] at [facility] History   Marital status: Married    Spouse name: [name]   Number of children: 0   Years of education: Not on file   Highest education level: Not on file  Occupational History    Employer: RETIRED  Tobacco Use   Smoking status: Never   Smokeless tobacco: Never   Tobacco comments:    NEVER USED TOBACCO  Vaping Use   Vaping Use: Never used  Substance and Sexual Activity   Alcohol use: No    Alcohol/week: 0.0 standard drinks   Drug use: No   Sexual activity: Not Currently    Birth control/protection: Surgical    Comment: 1st intercourse 75 yo-Fewer than 5 partners  Other Topics Concern   Not on file  Social History Narrative   Married, no children.   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   No Tob.  No alc.  No drugs.   Orig from [place].   Social Determinants of Health   Financial Resource Strain: Low Risk    Difficulty of Paying Living Expenses: Not hard at all  Food Insecurity: No Food Insecurity   Worried About Charity fundraiser in the Last Year: Never true   [place] in the Last Year: Never true  Transportation Needs: No Transportation Needs   Lack of Transportation (Medical): No   Lack of Transportation (Non-Medical): No  Physical Activity: Inactive   Days of Exercise per Week: 0 days   Minutes of Exercise per Session: 0 min  Stress: Stress Concern Present   Feeling of Stress : To some extent  Social Connections: Moderately Integrated   Frequency of Communication with Friends and Family: More than three times a week   Frequency of Social Gatherings with Friends and Family: More than three times a week   Attends Religious Services: More  than 4 times per year   Active Member of [employer] or Organizations: No   Attends Archivist Meetings: Never   Marital Status: Married  Human resources officer Violence: Not At Risk   Fear of Current or Ex-Partner: No   Emotionally Abused: No   Physically Abused: No   Sexually Abused: No    Current Outpatient Medications on File Prior to Visit  Medication Sig Dispense Refill   acetaminophen (TYLENOL) 325 MG tablet Take 650 mg  by mouth every 6 (six) hours as needed.     ALPRAZolam (XANAX) 1 MG tablet TAKE 1 TO 2 TABLETS BY MOUTH TWICE DAILY AS NEEDED. 120 tablet 1   Azelastine HCl (ASTEPRO) 0.15 % SOLN 2 sprays each nostril every 12 hours 30 mL 11   calcium carbonate (OSCAL) 1500 (600 Ca) MG TABS tablet Take by mouth daily.     Cholecalciferol (VITAMIN D3) 5000 units CAPS Take 4 capsules by mouth daily.      cyclobenzaprine (FLEXERIL) 5 MG tablet Take 5 mg by mouth at bedtime.     diclofenac Sodium (VOLTAREN) 1 % GEL Apply 4 g topically 4 (four) times daily.     doxylamine, Sleep, (UNISOM) 25 MG tablet Take 25 mg by mouth at bedtime as needed.     hydrocortisone 2.5 % cream Apply 1 application topically as needed. RECTAL ITCHING     levothyroxine (SYNTHROID) 75 MCG tablet Take 1 tablet (75 mcg total) by mouth daily. 90 tablet 3   meclizine (ANTIVERT) 25 MG tablet Take 1 tablet (25 mg total) by mouth 3 (three) times daily as needed for dizziness. 45 tablet 1   meloxicam (MOBIC) 7.5 MG tablet Take 2 tablets by mouth once daily (Patient taking differently: 7.5 mg daily.) 180 tablet 0   metoprolol tartrate (LOPRESSOR) 25 MG tablet Take 1 tablet (25 mg total) by mouth 2 (two) times daily. 180 tablet 3   Multiple Vitamins-Iron (MULTIVITAMIN/IRON PO) Take 1 tablet by mouth daily.     NIACIN PO Take by mouth daily.      omeprazole (PRILOSEC) 40 MG capsule Take 1 capsule (40 mg total) by mouth daily. 30 capsule 3   ondansetron (ZOFRAN) 8 MG tablet Take 1 tablet (8 mg total) by mouth every 8 (eight)  hours as needed for nausea or vomiting. 30 tablet 3   Oxycodone HCl 10 MG TABS 10 mg every 4 (four) hours as needed.      promethazine (PHENERGAN) 25 MG tablet TAKE ONE TABLET BY MOUTH EVERY 6 HOURS AS NEEDED FOR NAUSEA AND VOMITING 30 tablet 1   simvastatin (ZOCOR) 20 MG tablet Take 1 tablet (20 mg total) by mouth at bedtime. 90 tablet 3   Specialty Vitamins Products (MAGNESIUM, AMINO ACID CHELATE,) 133 MG tablet Take 1 tablet by mouth daily.     tolterodine (DETROL LA) 4 MG 24 hr capsule      traZODone (DESYREL) 50 MG tablet Take 1 tablet by mouth at bedtime.  2   No current facility-administered medications on file prior to visit.    Allergies  Allergen Reactions   Cymbalta [Duloxetine Hcl]     Facial edema   Iohexol      Code: HIVES, Desc: pt states her face and throat swells when administered IV contrast - KB, Onset Date: [id number]    Mepergan [Meperidine-Promethazine]     Cardiac arrest   Morphine Rash   Augmentin [Amoxicillin-Pot Clavulanate] Nausea Only   Citalopram Other (See Comments)    Jittery/heart racing    Hctz [Hydrochlorothiazide] Other (See Comments)    Too much urinating, felt dried out, etc--NO ALLERGY   Lexapro [Escitalopram Oxalate] Other (See Comments)    Jittery,heart racing   Lyrica [Pregabalin] Swelling    Face, hands, fingers, ankles   Promethazine Hcl    Valsartan     REACTION: pt states INTOL to Diovan    Family History  Problem Relation Age of Onset   Leukemia Brother    Lung disease Brother  Thyroid disease Brother    Melanoma Brother    Hypertension Mother    Thyroid disease Mother    Hypertension Father    Breast cancer Sister        Age 8's   Hypertension Sister    Coronary artery disease Sister    Lupus Sister    Thyroid disease Sister    Stomach cancer Brother    Cancer Other        "all over"   Parkinson's disease Sister    Colon cancer Neg Hx    Esophageal cancer Neg Hx    Rectal cancer Neg Hx     BP 110/62 (BP  Location: Right Arm, Patient Position: Sitting, Cuff Size: Large)   Pulse (!) 59   Ht $R'5\' 6"'Cd$  (1.676 m)   Wt 199 lb 9.6 oz (90.5 kg)   SpO2 95%   BMI 32.22 kg/m    Review of Systems     Objective:   Physical Exam NECK: There is no palpable thyroid enlargement.  No thyroid nodule is palpable.  No palpable lymphadenopathy at the anterior neck.     Lab Results  Component Value Date   TSH 1.31 05/05/2021   T3TOTAL 137 06/26/2020      Assessment & Plan:  Hypothyroidism: well-controlled.  Please continue the same synthroid.

## 2021-05-11 DIAGNOSIS — H8113 Benign paroxysmal vertigo, bilateral: Secondary | ICD-10-CM | POA: Diagnosis not present

## 2021-05-12 ENCOUNTER — Telehealth: Payer: Self-pay

## 2021-05-12 NOTE — Telephone Encounter (Signed)
Received PT initial evaluation concerning patient, Placed on PCP desk to review and sign, if appropriate.

## 2021-05-13 NOTE — Telephone Encounter (Signed)
Signed and put in box to go up front. Signed:  Crissie Sickles, MD           05/13/2021

## 2021-05-15 NOTE — Telephone Encounter (Signed)
Faxed signed form to [facility]. Fax confirmation received.

## 2021-05-21 DIAGNOSIS — G894 Chronic pain syndrome: Secondary | ICD-10-CM | POA: Diagnosis not present

## 2021-05-21 DIAGNOSIS — F329 Major depressive disorder, single episode, unspecified: Secondary | ICD-10-CM | POA: Diagnosis not present

## 2021-05-21 DIAGNOSIS — M961 Postlaminectomy syndrome, not elsewhere classified: Secondary | ICD-10-CM | POA: Diagnosis not present

## 2021-05-21 DIAGNOSIS — K5900 Constipation, unspecified: Secondary | ICD-10-CM | POA: Diagnosis not present

## 2021-06-03 DIAGNOSIS — M25462 Effusion, left knee: Secondary | ICD-10-CM | POA: Diagnosis not present

## 2021-06-03 DIAGNOSIS — M1712 Unilateral primary osteoarthritis, left knee: Secondary | ICD-10-CM | POA: Diagnosis not present

## 2021-06-18 DIAGNOSIS — G894 Chronic pain syndrome: Secondary | ICD-10-CM | POA: Diagnosis not present

## 2021-06-18 DIAGNOSIS — F329 Major depressive disorder, single episode, unspecified: Secondary | ICD-10-CM | POA: Diagnosis not present

## 2021-06-18 DIAGNOSIS — K5900 Constipation, unspecified: Secondary | ICD-10-CM | POA: Diagnosis not present

## 2021-06-18 DIAGNOSIS — M961 Postlaminectomy syndrome, not elsewhere classified: Secondary | ICD-10-CM | POA: Diagnosis not present

## 2021-07-16 DIAGNOSIS — Z79891 Long term (current) use of opiate analgesic: Secondary | ICD-10-CM | POA: Diagnosis not present

## 2021-07-16 DIAGNOSIS — G894 Chronic pain syndrome: Secondary | ICD-10-CM | POA: Diagnosis not present

## 2021-07-16 DIAGNOSIS — M961 Postlaminectomy syndrome, not elsewhere classified: Secondary | ICD-10-CM | POA: Diagnosis not present

## 2021-07-16 DIAGNOSIS — F329 Major depressive disorder, single episode, unspecified: Secondary | ICD-10-CM | POA: Diagnosis not present

## 2021-07-16 DIAGNOSIS — K5900 Constipation, unspecified: Secondary | ICD-10-CM | POA: Diagnosis not present

## 2021-07-22 ENCOUNTER — Ambulatory Visit (INDEPENDENT_AMBULATORY_CARE_PROVIDER_SITE_OTHER): Payer: Medicare Other | Admitting: Family Medicine

## 2021-07-22 ENCOUNTER — Other Ambulatory Visit: Payer: Self-pay

## 2021-07-22 ENCOUNTER — Encounter: Payer: Self-pay | Admitting: Family Medicine

## 2021-07-22 VITALS — BP 122/78 | HR 68 | Temp 97.8°F | Ht 66.0 in | Wt 205.6 lb

## 2021-07-22 DIAGNOSIS — G8929 Other chronic pain: Secondary | ICD-10-CM

## 2021-07-22 DIAGNOSIS — M79662 Pain in left lower leg: Secondary | ICD-10-CM | POA: Diagnosis not present

## 2021-07-22 DIAGNOSIS — M79609 Pain in unspecified limb: Secondary | ICD-10-CM

## 2021-07-22 DIAGNOSIS — M25562 Pain in left knee: Secondary | ICD-10-CM | POA: Diagnosis not present

## 2021-07-22 NOTE — Progress Notes (Signed)
OFFICE VISIT  07/22/2021  CC:  Chief Complaint  Patient presents with   Back Pain    9/10 pain level, describes pain as being sharp and aslo c/o of aches. Her left leg is also causing pain, it feels like a knot behind her knee. It hurts to bend and stand up. Has taken tylenol and voltaren gel for relief along with pain med. Had fluid drained out a few weeks ago at ortho appt   HPI:    Patient is a 75 y.o. female who presents accompanied by her husband [name] for left knee pain. [name] has had severe pain in the left knee for the last several weeks.  No preceding injury.  She had been walking more due to having to take care of her husband more lately.  When this initially flared up she went to her orthopedist office and reports that the knee was aspirated and then injected with steroid.  Unfortunately this did not help anything.  She now says the pain has extended around the back of her left knee and she also has pain all the way down her left lower leg. She has chronic low back pain but the pain in her lower leg on the left is not usual for her. No fever, chills, or malaise.  The knee has not turned red   PMP AWARE reviewed today: most recent rx for oxycodone was filled 06/23/21, # 180, rx by [name]. Most recent alpraz rx filled 06/18/21, #120, rx by me. No red flags.  Past Medical History:  Diagnosis Date   Allergic rhinitis    Anxiety    Chest pain    cr   Chronic gastritis    dx'd by EGD 2018 (erosive, likely NSAID-induced). 06/2019 +gastritis.   Chronic pain syndrome    right knee osteo, hx of TKA in R knee.  Pt was told by [name] that no further surgical options are available for her; also, chronic lower back pain.--[name].   Chronic renal insufficiency, stage II (mild) 2015   vs acute fluctuations depending on hydration?---GFR from 60s up to 80s.   Diverticulosis of colon    with hx of 'itis   DJD (degenerative joint disease)    L spine and knees.   R knee inject 03/15/18, L knee inject 12/26/18 (by Dr. Voytek).   Epigastric pain    As of 01/2017, GI (Dr. Danis) planned EGD.   FATIGUE / MALAISE 07/01/2009   Chronic   GERD (gastroesophageal reflux disease)    History of colonic polyps    HTN (hypertension)    Hypercholesterolemia    recommended restart of simvastatin 07/2018   Hypothyroid 07/05/2012   IBS (irritable bowel syndrome)    IC (interstitial cystitis)    [name] at [facility]   Iron deficiency anemia, unspecified 06/27/2013   Has required IV iron on multiple occasions (Dr. Ennever).  Hemoccults neg 12/2016. Most recent hem f/u labs normal 09/2020.   Lumbar back pain    Spinal stenosis, lumbar region with neurogenic claudication L5-S1 sx's--ESI's no help.  CT L spine stable post-surg appearance as of 12/2019   Major depression, recurrent (HCC)    vs dysthymia,chronic   Memory impairment 10/03/2012   Memory impairment 10/03/2012 >referral to neuro     Monoclonal gammopathy of undetermined significance    IgM kappa MGUS (Dr. Ennever) routine f/u has shown stability of this problem (most recent f/u 09/2020)   Neuromuscular disorder (HCC)    OSA (obstructive   sleep apnea)    Latest sleep study 01/2017.  Some adjustments to CPAP being made+ dental referral for consideration of oral appliance --per pulm MD.   Osteopenia 04/2014; 03/2018   2015: T score -2.0 FRAX 11%/1.8%.  2019 FRAX 10%/1.5%.   Otitis externa, candida 09/7492   [name] tx'd her with clotrimazole 1% external solution x 1 mo   Palpitations    Psoriasis    Trochanteric bursitis of both hips    + injections by ortho in the past   Vertigo 2020/21   6+ wks, ->to ENT->?vestib neuronitis with ongoing motion sensitivity, gradually resolving.([name] 4/96/75). MRI brain normal 09/2020.   Vitamin D deficiency    Xerostomia    and xerophthalmia--per [name] (he suggests SSA, SSB, ESR, rh factor, ANA, and antimicrosmal and antithyroglobulin ab's as of 02/17/16--all  these labs were NORMAL.    Past Surgical History:  Procedure Laterality Date   ABDOMINAL HYSTERECTOMY     for prolapse with abdominal incision   BACK SURGERY  01/23/2015   Lumbar decompression with L2-L5 fusion ([name])   Apache     no PCI   CHOLECYSTECTOMY     COLONOSCOPY  04/2006;07/16/19   Diverticulosis, o/w normal.  06/2019->2 adenomas, +Diverticulosis.  Recall 2023-2025.   DEXA  04/06/2018   T score -1.5 femur neck, -2.2 radius.  FRAX 10%/1.5%.  Repeat DEXA 2 yrs.   ESOPHAGOGASTRODUODENOSCOPY  05/29/2009; 03/01/17;07/16/19   Esoph normal.  Gastritis (H pylori NEG) + gastric polyps (benign).  Duodenal biopsy NORMAL.  2018-mild chronic gastritis, H pylori NEG. 06/2019 tortuous esophagus w/out stenosis +chronic gastritis H pyl neg.   EYE SURGERY Left    "lazy eye"   HAND SURGERY Right    TRIGGER FINGER; index finger   HERNIA REPAIR     umbilical   KNEE ARTHROSCOPY     RIGHT KNEE X 5   LAMINOTOMY  2010   L-4, L-5 FUSION   NASAL SINUS SURGERY     polypectomy (remote past)   OOPHORECTOMY  2010   Laparoscopic BSO (path benign)   PATELLA FRACTURE SURGERY  10-08   [name]   POSTERIOR LAMINECTOMY / DECOMPRESSION LUMBAR SPINE  9-09   [name]   TOTAL KNEE ARTHROPLASTY  12-07 and 4-07   [name], revision 12-09 [name] at [facility]    Outpatient Medications Prior to Visit  Medication Sig Dispense Refill   acetaminophen (TYLENOL) 325 MG tablet Take 650 mg by mouth every 6 (six) hours as needed.     ALPRAZolam (XANAX) 1 MG tablet TAKE 1 TO 2 TABLETS BY MOUTH TWICE DAILY AS NEEDED. 120 tablet 1   Azelastine HCl (ASTEPRO) 0.15 % SOLN 2 sprays each nostril every 12 hours 30 mL 11   calcium carbonate (OSCAL) 1500 (600 Ca) MG TABS tablet Take by mouth daily.     Cholecalciferol (VITAMIN D3) 5000 units CAPS Take 4 capsules by mouth daily.      cyclobenzaprine (FLEXERIL) 5 MG tablet Take 5 mg by mouth at bedtime.     diclofenac Sodium (VOLTAREN) 1 % GEL Apply 4 g  topically 4 (four) times daily.     doxylamine, Sleep, (UNISOM) 25 MG tablet Take 25 mg by mouth at bedtime as needed.     hydrocortisone 2.5 % cream Apply 1 application topically as needed. RECTAL ITCHING     levothyroxine (SYNTHROID) 75 MCG tablet Take 1 tablet (75 mcg total) by mouth daily. 90 tablet 3   meclizine (ANTIVERT) 25  MG tablet Take 1 tablet (25 mg total) by mouth 3 (three) times daily as needed for dizziness. 45 tablet 1   meloxicam (MOBIC) 7.5 MG tablet Take 2 tablets by mouth once daily (Patient taking differently: 7.5 mg daily.) 180 tablet 0   metoprolol tartrate (LOPRESSOR) 25 MG tablet Take 1 tablet (25 mg total) by mouth 2 (two) times daily. 180 tablet 3   Multiple Vitamins-Iron (MULTIVITAMIN/IRON PO) Take 1 tablet by mouth daily.     NIACIN PO Take by mouth daily.      omeprazole (PRILOSEC) 40 MG capsule Take 1 capsule (40 mg total) by mouth daily. 30 capsule 3   ondansetron (ZOFRAN) 8 MG tablet Take 1 tablet (8 mg total) by mouth every 8 (eight) hours as needed for nausea or vomiting. 30 tablet 3   Oxycodone HCl 10 MG TABS 10 mg every 4 (four) hours as needed.      promethazine (PHENERGAN) 25 MG tablet TAKE ONE TABLET BY MOUTH EVERY 6 HOURS AS NEEDED FOR NAUSEA AND VOMITING 30 tablet 1   simvastatin (ZOCOR) 20 MG tablet Take 1 tablet (20 mg total) by mouth at bedtime. 90 tablet 3   Specialty Vitamins Products (MAGNESIUM, AMINO ACID CHELATE,) 133 MG tablet Take 1 tablet by mouth daily.     tolterodine (DETROL LA) 4 MG 24 hr capsule      traZODone (DESYREL) 50 MG tablet Take 1 tablet by mouth at bedtime.  2   No facility-administered medications prior to visit.    Allergies  Allergen Reactions   Cymbalta [Duloxetine Hcl]     Facial edema   Iohexol      Code: HIVES, Desc: pt states her face and throat swells when administered IV contrast - KB, Onset Date: [id number]    Mepergan [Meperidine-Promethazine]     Cardiac arrest   Morphine Rash   Augmentin [Amoxicillin-Pot  Clavulanate] Nausea Only   Citalopram Other (See Comments)    Jittery/heart racing    Hctz [Hydrochlorothiazide] Other (See Comments)    Too much urinating, felt dried out, etc--NO ALLERGY   Lexapro [Escitalopram Oxalate] Other (See Comments)    Jittery,heart racing   Lyrica [Pregabalin] Swelling    Face, hands, fingers, ankles   Promethazine Hcl    Valsartan     REACTION: pt states INTOL to Diovan    ROS As per HPI  PE: Vitals with BMI 07/22/2021 05/05/2021 05/01/2021  Height 5' 6" 5' 6" 5' 6"  Weight 205 lbs 10 oz 199 lbs 10 oz 201 lbs 3 oz  BMI 33.2 45.62 56.38  Systolic [phone]  Diastolic 78 62 78  Pulse 68 59 66     General: Alert, noticeable wincing when trying to walk, but no acute distress. I do not notice any significant effusion in the left knee but she has limited flexion to about 75 to 80 degrees.  Extension is full.  Knee without warmth or erythema.  She is quite tender diffusely over the anterior, lateral, medial, and posterior aspect of left knee.  I cannot detect any focal swelling in the popliteal region.  Entire left lower leg is tender to palpation but there is no erythema or pitting edema.  LABS:  None today  IMPRESSION AND PLAN:  Acute on chronic left knee pain with prominent popliteal and lower leg pain on the left, progressing despite knee aspiration and steroid injection by her orthopedist several weeks ago.   She has known osteoarthritis.  There are no knee images  in electronic medical record.  We will get Ortho records from [name].  We will start with plain films of the left knee and we will also do a venous Doppler ultrasound to rule out DVT. May need MRI of knee to further assess but I have encouraged her to arrange follow-up with her orthopedist so they can direct her further evaluation. No medications were prescribed today.  She does take Mobic daily and has oxycodone that she takes for her back, although it is not helping her  knee.  An After Visit Summary was printed and given to the patient.  FOLLOW UP: Return for To be determined based on results of imaging and specialist evaluation..  Signed:  Phil , MD           07/22/2021  

## 2021-07-29 ENCOUNTER — Ambulatory Visit (HOSPITAL_BASED_OUTPATIENT_CLINIC_OR_DEPARTMENT_OTHER)
Admission: RE | Admit: 2021-07-29 | Discharge: 2021-07-29 | Disposition: A | Discharge status: Home / Self Care | Payer: Medicare Other | Source: Ambulatory Visit | Attending: Family Medicine | Admitting: Family Medicine

## 2021-07-29 ENCOUNTER — Ambulatory Visit (HOSPITAL_BASED_OUTPATIENT_CLINIC_OR_DEPARTMENT_OTHER): Payer: Medicare Other | Admitting: Radiology

## 2021-07-29 ENCOUNTER — Other Ambulatory Visit: Payer: Self-pay

## 2021-07-29 DIAGNOSIS — M25562 Pain in left knee: Secondary | ICD-10-CM

## 2021-07-29 DIAGNOSIS — M79662 Pain in left lower leg: Secondary | ICD-10-CM | POA: Insufficient documentation

## 2021-07-29 DIAGNOSIS — G8929 Other chronic pain: Secondary | ICD-10-CM | POA: Diagnosis not present

## 2021-07-29 DIAGNOSIS — M79609 Pain in unspecified limb: Secondary | ICD-10-CM | POA: Insufficient documentation

## 2021-07-29 DIAGNOSIS — M7989 Other specified soft tissue disorders: Secondary | ICD-10-CM | POA: Diagnosis not present

## 2021-07-29 DIAGNOSIS — R600 Localized edema: Secondary | ICD-10-CM | POA: Diagnosis not present

## 2021-07-30 ENCOUNTER — Other Ambulatory Visit: Payer: Self-pay | Admitting: Family Medicine

## 2021-07-31 ENCOUNTER — Encounter: Payer: Self-pay | Admitting: Family Medicine

## 2021-07-31 NOTE — Telephone Encounter (Signed)
Requesting: alprazolam Contract: 03/18/21 UDS: 03/18/21 Last Visit:07/22/21 Next Visit:10/22/21 Last Refill:03/18/21(120,1)  Please Advise. Medication pending

## 2021-08-11 ENCOUNTER — Other Ambulatory Visit: Payer: Self-pay | Admitting: Endocrinology

## 2021-08-17 ENCOUNTER — Other Ambulatory Visit: Payer: Self-pay | Admitting: Endocrinology

## 2021-08-17 DIAGNOSIS — G894 Chronic pain syndrome: Secondary | ICD-10-CM | POA: Diagnosis not present

## 2021-08-17 DIAGNOSIS — M961 Postlaminectomy syndrome, not elsewhere classified: Secondary | ICD-10-CM | POA: Diagnosis not present

## 2021-08-17 DIAGNOSIS — K5900 Constipation, unspecified: Secondary | ICD-10-CM | POA: Diagnosis not present

## 2021-08-17 DIAGNOSIS — F329 Major depressive disorder, single episode, unspecified: Secondary | ICD-10-CM | POA: Diagnosis not present

## 2021-08-22 ENCOUNTER — Other Ambulatory Visit: Payer: Self-pay | Admitting: Endocrinology

## 2021-08-26 ENCOUNTER — Ambulatory Visit (INDEPENDENT_AMBULATORY_CARE_PROVIDER_SITE_OTHER): Payer: Medicare Other | Admitting: Orthopaedic Surgery

## 2021-08-26 ENCOUNTER — Other Ambulatory Visit: Payer: Self-pay

## 2021-08-26 ENCOUNTER — Encounter: Payer: Self-pay | Admitting: Orthopaedic Surgery

## 2021-08-26 ENCOUNTER — Ambulatory Visit (INDEPENDENT_AMBULATORY_CARE_PROVIDER_SITE_OTHER): Payer: Medicare Other

## 2021-08-26 DIAGNOSIS — M25562 Pain in left knee: Secondary | ICD-10-CM | POA: Diagnosis not present

## 2021-08-26 DIAGNOSIS — Z96651 Presence of right artificial knee joint: Secondary | ICD-10-CM

## 2021-08-26 NOTE — Progress Notes (Signed)
Office Visit Note   Patient: [name]           Date of Birth: 1946/04/06           MRN: [id number] Visit Date: 08/26/2021              Requested by: [name], MD [address] [place],  [place] [address] PCP: [name], MD   Assessment & Plan: Visit Diagnoses:  1. History of total right knee replacement   2. Acute pain of left knee     Plan: Would not recommend any intervention in regards to the right knee at this time.  Continue pain management for the pain.  Continue to work on strengthening the knee.  In regards to the left knee recommend MRI of the left knee to rule out meniscal tear given her mechanical symptoms and recurrent effusion.  Questions were encouraged and answered at length today by Dr. Ninfa Linden and myself.  Follow-Up Instructions: Return After MRI.   Orders:  Orders Placed This Encounter  Procedures   XR Knee 1-2 Views Right   No orders of the defined types were placed in this encounter.     Procedures: No procedures performed   Clinical Data: No additional findings.   Subjective: Chief Complaint  Patient presents with   Right Knee - Pain   Left Knee - Pain    HPI [name] is a 75 year old female were seen for the first time for left knee pain.  She also has right knee pain.  History of right total knee arthroplasty in 2007 and some type of surgical procedure to remove "excess bone".  Ultimately underwent a revision of the right total knee secondary to loosening and had revision hardware with long stems placed.  She has had no relief from the pain in regards to the right knee since undergoing primary surgery in 2007.  She is currently in pain management on oxycodone due to the right knee pain.  She reports an acute flare of left knee pain without known injury which began in November.  She was seen at [facility] a month ago aspiration was performed and cortisone injection given.  Patient she states that this gave her  no relief in the knee.  She notes continued swelling left knee giving way locking no painful popping.  Pains mostly the lateral aspect of the knee and posterior aspect of her knee medially. Radiographs left knee are reviewed on epic from November of this year.  These show no acute fracture.  Mild to moderate tricompartmental arthritic changes.  No bony abnormalities. Review of Systems See HPI otherwise negative or noncontributory.  Objective: Vital Signs: There were no vitals taken for this visit.  Physical Exam Constitutional:      Appearance: She is not ill-appearing or diaphoretic.  Pulmonary:     Effort: Pulmonary effort is normal.  Neurological:     Mental Status: She is alert and oriented to person, place, and time.  Psychiatric:        Mood and Affect: Mood normal.    Ortho Exam Bilateral knees good range of motion of both knees.  No gross instability right knee with valgus varus stressing at full extension and 30 degrees.  Anterior drawer is negative right knee no abnormal warmth erythema or effusion right knee.  Surgical incisions well-healed.  Left knee full range of motion.  Positive McMurray's.  No instability valgus/ varus stressing.  Left knee slight effusion  no abnormal warmth erythema.  Tenderness along medial lateral joint line. Specialty Comments:  No specialty comments available.  Imaging: XR Knee 1-2 Views Right  Result Date: 08/26/2021 Right knee AP lateral views: No acute fracture.  Status post revision right total knee hardware without any evidence of loosening.  Knee is well located.  No bony abnormalities.    PMFS History: Patient Active Problem List   Diagnosis Date Noted   [place] 07/07/2020   Vertigo 10/21/2019   Presbycusis of both ears 10/21/2019   Tinnitus aurium, bilateral 10/21/2019   Acute pharyngitis 03/01/2016   Myalgia and myositis 02/24/2016   Arthralgia 02/24/2016   Dry mouth 02/24/2016   Hoarseness of voice 02/17/2016   Referred  otalgia of right ear 02/17/2016   Throat pain in adult 02/17/2016   MGUS (monoclonal gammopathy of unknown significance) 12/17/2015   DDD (degenerative disc disease), lumbar 01/20/2015   Spinal stenosis, lumbar region, with neurogenic claudication 01/20/2015   Degenerative disc disease, lumbar 01/20/2015   Displacement of lumbar intervertebral disc without myelopathy 10/15/2014   Uncontrolled hypertension 06/01/2014   Depression 06/01/2014   Chronic fatigue fibromyalgia syndrome 02/13/2014   Left-sided chest wall pain 02/13/2014   Insomnia 02/13/2014   Iron deficiency anemia 06/27/2013   Hypothyroid 07/05/2012   Involuntary muscle contractions 07/04/2012   Chronic pain syndrome 01/27/2011   OBSTRUCTIVE SLEEP APNEA 08/28/2009   CENTRAL SLEEP APNEA CONDS CLASSIFIED ELSEWHERE 08/28/2009   GASTRITIS 08/21/2008   PERSONAL HX COLONIC POLYPS 08/05/2008   Vitamin D deficiency 01/13/2008   PSORIASIS 01/13/2008   Disorder of plasma protein metabolism 01/12/2008   DEGENERATIVE JOINT DISEASE 01/12/2008   HYPERCHOLESTEROLEMIA 08/01/2007   Essential hypertension 08/01/2007   GERD 08/01/2007   Fibromyalgia syndrome 08/01/2007   PALPITATIONS, HX OF 08/01/2007   Past Medical History:  Diagnosis Date   Allergic rhinitis    Anxiety    Chest pain    cr   Chronic gastritis    dx'd by EGD 2018 (erosive, likely NSAID-induced). 06/2019 +gastritis.   Chronic pain syndrome    right knee osteo, hx of TKA in R knee.  Pt was told by [name] that no further surgical options are available for her; also, chronic lower back pain.--[name].   Chronic renal insufficiency, stage II (mild) 2015   vs acute fluctuations depending on hydration?---GFR from 60s up to 80s.   Diverticulosis of colon    with hx of 'itis   DJD (degenerative joint disease)    L spine and knees.  R knee inject 03/15/18, L knee inject 12/26/18 (by Dr. Lynann Bologna).   Epigastric pain    As of 01/2017, GI (Dr. Loletha Carrow) planned  EGD.   FATIGUE / MALAISE 07/01/2009   Chronic   GERD (gastroesophageal reflux disease)    History of colonic polyps    HTN (hypertension)    Hypercholesterolemia    recommended restart of simvastatin 07/2018   Hypothyroid 07/05/2012   IBS (irritable bowel syndrome)    IC (interstitial cystitis)    [name] at [facility]   Iron deficiency anemia, unspecified 06/27/2013   Has required IV iron on multiple occasions ([name]).  Hemoccults neg 12/2016. Most recent hem f/u labs normal 09/2020.   Lumbar back pain    Spinal stenosis, lumbar region with neurogenic claudication L5-S1 sx's--ESI's no help.  CT L spine stable post-surg appearance as of 12/2019   Major depression, recurrent ([place])    vs dysthymia,chronic   Memory impairment 10/03/2012   Memory impairment  10/03/2012 >referral to neuro     Monoclonal gammopathy of undetermined significance    IgM kappa MGUS ([name]) routine f/u has shown stability of this problem (most recent f/u 09/2020)   Neuromuscular disorder ([place])    OSA (obstructive sleep apnea)    Latest sleep study 01/2017.  Some adjustments to CPAP being made+ dental referral for consideration of oral appliance --per pulm MD.   Osteopenia 04/2014; 03/2018   2015: T score -2.0 FRAX 11%/1.8%.  2019 FRAX 10%/1.5%.   Otitis externa, candida 54/6503   [name] tx'd her with clotrimazole 1% external solution x 1 mo   Palpitations    Psoriasis    Trochanteric bursitis of both hips    + injections by ortho in the past   Vertigo 2020/21   6+ wks, ->to ENT->?vestib neuronitis with ongoing motion sensitivity, gradually resolving.([name] 5/46/56). MRI brain normal 09/2020.   Vitamin D deficiency    Xerostomia    and xerophthalmia--per [name] (he suggests SSA, SSB, ESR, rh factor, ANA, and antimicrosmal and antithyroglobulin ab's as of 02/17/16--all these labs were NORMAL.    Family History  Problem Relation Age of Onset   Leukemia Brother    Lung disease Brother     Thyroid disease Brother    Melanoma Brother    Hypertension Mother    Thyroid disease Mother    Hypertension Father    Breast cancer Sister        Age 23's   Hypertension Sister    Coronary artery disease Sister    Lupus Sister    Thyroid disease Sister    Stomach cancer Brother    Cancer Other        "all over"   Parkinson's disease Sister    Colon cancer Neg Hx    Esophageal cancer Neg Hx    Rectal cancer Neg Hx     Past Surgical History:  Procedure Laterality Date   ABDOMINAL HYSTERECTOMY     for prolapse with abdominal incision   BACK SURGERY  01/23/2015   Lumbar decompression with L2-L5 fusion ([name])   [place]     no PCI   CHOLECYSTECTOMY     COLONOSCOPY  04/2006;07/16/19   Diverticulosis, o/w normal.  06/2019->2 adenomas, +Diverticulosis.  Recall 2023-2025.   DEXA  04/06/2018   T score -1.5 femur neck, -2.2 radius.  FRAX 10%/1.5%.  Repeat DEXA 2 yrs.   ESOPHAGOGASTRODUODENOSCOPY  05/29/2009; 03/01/17;07/16/19   Esoph normal.  Gastritis (H pylori NEG) + gastric polyps (benign).  Duodenal biopsy NORMAL.  2018-mild chronic gastritis, H pylori NEG. 06/2019 tortuous esophagus w/out stenosis +chronic gastritis H pyl neg.   EYE SURGERY Left    "lazy eye"   HAND SURGERY Right    TRIGGER FINGER; index finger   HERNIA REPAIR     umbilical   KNEE ARTHROSCOPY     RIGHT KNEE X 5   LAMINOTOMY  2010   L-4, L-5 FUSION   NASAL SINUS SURGERY     polypectomy (remote past)   OOPHORECTOMY  2010   Laparoscopic BSO (path benign)   PATELLA FRACTURE SURGERY  10-08   [name]   POSTERIOR LAMINECTOMY / DECOMPRESSION LUMBAR SPINE  9-09   [name]   TOTAL KNEE ARTHROPLASTY  12-07 and 4-07   [name], revision 12-09 [name] at [facility] History   Occupational History    Employer: RETIRED  Tobacco Use   Smoking status: Never   Smokeless tobacco:  Never   Tobacco comments:    NEVER USED TOBACCO  Vaping Use   Vaping Use: Never used  Substance and Sexual  Activity   Alcohol use: No    Alcohol/week: 0.0 standard drinks   Drug use: No   Sexual activity: Not Currently    Birth control/protection: Surgical    Comment: 1st intercourse 75 yo-Fewer than 5 partners

## 2021-09-03 ENCOUNTER — Other Ambulatory Visit: Payer: Self-pay

## 2021-09-04 ENCOUNTER — Encounter: Payer: Self-pay | Admitting: Family Medicine

## 2021-09-04 ENCOUNTER — Ambulatory Visit (INDEPENDENT_AMBULATORY_CARE_PROVIDER_SITE_OTHER): Payer: Medicare Other | Admitting: Family Medicine

## 2021-09-04 ENCOUNTER — Other Ambulatory Visit: Payer: Self-pay

## 2021-09-04 VITALS — BP 127/76 | HR 52 | Temp 97.4°F | Ht 66.0 in | Wt 195.6 lb

## 2021-09-04 DIAGNOSIS — K5903 Drug induced constipation: Secondary | ICD-10-CM | POA: Diagnosis not present

## 2021-09-04 DIAGNOSIS — R110 Nausea: Secondary | ICD-10-CM

## 2021-09-04 DIAGNOSIS — T402X5A Adverse effect of other opioids, initial encounter: Secondary | ICD-10-CM

## 2021-09-04 DIAGNOSIS — R109 Unspecified abdominal pain: Secondary | ICD-10-CM | POA: Diagnosis not present

## 2021-09-04 LAB — COMPREHENSIVE METABOLIC PANEL
ALT: 16 U/L (ref 0–35)
AST: 16 U/L (ref 0–37)
Albumin: 4.4 g/dL (ref 3.5–5.2)
Alkaline Phosphatase: 92 U/L (ref 39–117)
BUN: 17 mg/dL (ref 6–23)
CO2: 30 mEq/L (ref 19–32)
Calcium: 9.7 mg/dL (ref 8.4–10.5)
Chloride: 102 mEq/L (ref 96–112)
Creatinine, Ser: 1.04 mg/dL (ref 0.40–1.20)
GFR: 52.85 mL/min — ABNORMAL LOW (ref >60.00)
Glucose, Bld: 86 mg/dL (ref 70–99)
Potassium: 4.3 mEq/L (ref 3.5–5.1)
Sodium: 140 mEq/L (ref 135–145)
Total Bilirubin: 0.9 mg/dL (ref 0.2–1.2)
Total Protein: 7.2 g/dL (ref 6.0–8.3)

## 2021-09-04 LAB — CBC WITH DIFFERENTIAL/PLATELET
Basophils Absolute: 0.1 10*3/uL (ref 0.0–0.1)
Basophils Relative: 0.9 % (ref 0.0–3.0)
Eosinophils Absolute: 0.3 10*3/uL (ref 0.0–0.7)
Eosinophils Relative: 3.9 % (ref 0.0–5.0)
HCT: 45 % (ref 36.0–46.0)
Hemoglobin: 15 g/dL (ref 12.0–15.0)
Lymphocytes Relative: 34.7 % (ref 12.0–46.0)
Lymphs Abs: 2.5 10*3/uL (ref 0.7–4.0)
MCHC: 33.4 g/dL (ref 30.0–36.0)
MCV: 84.9 fl (ref 78.0–100.0)
Monocytes Absolute: 0.4 10*3/uL (ref 0.1–1.0)
Monocytes Relative: 6.3 % (ref 3.0–12.0)
Neutro Abs: 3.9 10*3/uL (ref 1.4–7.7)
Neutrophils Relative %: 54.2 % (ref 43.0–77.0)
Platelets: 201 10*3/uL (ref 150.0–400.0)
RBC: 5.3 Mil/uL — ABNORMAL HIGH (ref 3.87–5.11)
RDW: 14.1 % (ref 11.5–15.5)
WBC: 7.1 10*3/uL (ref 4.0–10.5)

## 2021-09-04 LAB — LIPASE: Lipase: 8 U/L — ABNORMAL LOW (ref 11.0–59.0)

## 2021-09-04 MED ORDER — PROMETHAZINE HCL 25 MG PO TABS: ORAL_TABLET | ORAL | 3 refills | Status: DC

## 2021-09-04 MED ORDER — LINACLOTIDE 145 MCG PO CAPS: 145.0000 ug | ORAL_CAPSULE | Freq: Every day | ORAL | 6 refills | Status: DC

## 2021-09-04 MED ORDER — LUBIPROSTONE 24 MCG PO CAPS: 24.0000 ug | ORAL_CAPSULE | Freq: Two times a day (BID) | ORAL | 6 refills | Status: DC

## 2021-09-04 NOTE — Addendum Note (Signed)
Addended by: [name] on: 09/04/2021 05:08 PM   Modules accepted: Orders

## 2021-09-04 NOTE — Patient Instructions (Signed)
1 cap full of MiraLAX every 4 hours and 1 fleets enema every 8 hours until you feel like you have had 2 large evacuations of stool. Then start the daily medication I sent to your pharmacy called lubiprostone--as directed on pill bottle.

## 2021-09-04 NOTE — Progress Notes (Addendum)
OFFICE VISIT  09/04/2021  CC:  Chief Complaint  Patient presents with   Nausea    Was using phenergan prn but changed to zofran durng last OV. Zofran not working as well and would like to switch back to phenergan   HPI:    Patient is a 75 y.o. female who presents for "sick on stomach".  HPI: [name] reports about a 10-day history of severe nausea without vomiting.  She has some intermittent gassy type abdominal pain that resolves when she passes gas.  Otherwise no abdominal pain.  Zofran not helping.  She has severe constipation.  Typically has small pebbles every few days, using MiraLAX 1 capful daily.  Says she is drinking plenty of water. Has elevation of chronic anxiety level lately, miserable because she cannot sleep lately. No fevers, no urinary symptoms, no flank pain.  She takes oxycodone, anywhere from 4-6 tabs daily for chronic pain, particularly knee pains. Orthopedics has plans to do an MRI on left knee soon.   Only occasionally takes mobic 7.$RemoveBefor'5mg'UsrLUwlFkjXV$  qd.  Tades prilosec $RemoveBefore'40mg'ianQOdndgMYNk$  qd Pt with hx of cholecystectomy.  She does not drink alcohol.  ROS as above, plus--> no fevers, no CP, no SOB, no wheezing, no cough, no dizziness, no HAs, no rashes, no melena/hematochezia.  No polyuria or polydipsia.   No focal weakness, paresthesias, or tremors.  No acute vision or hearing abnormalities.  No dysuria or unusual/new urinary urgency or frequency.  No recent changes in lower legs. No palpitations.     Past Medical History:  Diagnosis Date   Allergic rhinitis    Anxiety    Chest pain    cr   Chronic gastritis    dx'd by EGD 2018 (erosive, likely NSAID-induced). 06/2019 +gastritis.   Chronic pain syndrome    right knee osteo, hx of TKA in R knee.  Pt was told by [name] that no further surgical options are available for her; also, chronic lower back pain.--[name].   Chronic renal insufficiency, stage II (mild) 2015   vs acute fluctuations depending on  hydration?---GFR from 60s up to 80s.   Diverticulosis of colon    with hx of 'itis   DJD (degenerative joint disease)    L spine and knees.  R knee inject 03/15/18, L knee inject 12/26/18 (by Dr. Lynann Bologna).   Epigastric pain    As of 01/2017, GI (Dr. Loletha Carrow) planned EGD.   FATIGUE / MALAISE 07/01/2009   Chronic   GERD (gastroesophageal reflux disease)    History of colonic polyps    HTN (hypertension)    Hypercholesterolemia    recommended restart of simvastatin 07/2018   Hypothyroid 07/05/2012   IBS (irritable bowel syndrome)    IC (interstitial cystitis)    [name] at [facility]   Iron deficiency anemia, unspecified 06/27/2013   Has required IV iron on multiple occasions ([name]).  Hemoccults neg 12/2016. Most recent hem f/u labs normal 09/2020.   Lumbar back pain    Spinal stenosis, lumbar region with neurogenic claudication L5-S1 sx's--ESI's no help.  CT L spine stable post-surg appearance as of 12/2019   Major depression, recurrent ([place])    vs dysthymia,chronic   Memory impairment 10/03/2012   Memory impairment 10/03/2012 >referral to neuro     Monoclonal gammopathy of undetermined significance    IgM kappa MGUS ([name]) routine f/u has shown stability of this problem (most recent f/u 09/2020)   Neuromuscular disorder (HCC)    OSA (obstructive sleep apnea)  Latest sleep study 01/2017.  Some adjustments to CPAP being made+ dental referral for consideration of oral appliance --per pulm MD.   Osteopenia 04/2014; 03/2018   2015: T score -2.0 FRAX 11%/1.8%.  2019 FRAX 10%/1.5%.   Otitis externa, candida 93/8182   [name] tx'd her with clotrimazole 1% external solution x 1 mo   Palpitations    Psoriasis    Trochanteric bursitis of both hips    + injections by ortho in the past   Vertigo 2020/21   6+ wks, ->to ENT->?vestib neuronitis with ongoing motion sensitivity, gradually resolving.([name] 9/93/71). MRI brain normal 09/2020.   Vitamin D deficiency    Xerostomia     and xerophthalmia--per [name] (he suggests SSA, SSB, ESR, rh factor, ANA, and antimicrosmal and antithyroglobulin ab's as of 02/17/16--all these labs were NORMAL.    Past Surgical History:  Procedure Laterality Date   ABDOMINAL HYSTERECTOMY     for prolapse with abdominal incision   BACK SURGERY  01/23/2015   Lumbar decompression with L2-L5 fusion ([name])   [place]     no PCI   CHOLECYSTECTOMY     COLONOSCOPY  04/2006;07/16/19   Diverticulosis, o/w normal.  06/2019->2 adenomas, +Diverticulosis.  Recall 2023-2025.   DEXA  04/06/2018   T score -1.5 femur neck, -2.2 radius.  FRAX 10%/1.5%.  Repeat DEXA 2 yrs.   ESOPHAGOGASTRODUODENOSCOPY  05/29/2009; 03/01/17;07/16/19   Esoph normal.  Gastritis (H pylori NEG) + gastric polyps (benign).  Duodenal biopsy NORMAL.  2018-mild chronic gastritis, H pylori NEG. 06/2019 tortuous esophagus w/out stenosis +chronic gastritis H pyl neg.   EYE SURGERY Left    "lazy eye"   HAND SURGERY Right    TRIGGER FINGER; index finger   HERNIA REPAIR     umbilical   KNEE ARTHROSCOPY     RIGHT KNEE X 5   LAMINOTOMY  2010   L-4, L-5 FUSION   NASAL SINUS SURGERY     polypectomy (remote past)   OOPHORECTOMY  2010   Laparoscopic BSO (path benign)   PATELLA FRACTURE SURGERY  10-08   [name]   POSTERIOR LAMINECTOMY / DECOMPRESSION LUMBAR SPINE  9-09   [name]   TOTAL KNEE ARTHROPLASTY  12-07 and 4-07   [name], revision 12-09 [name] at [facility]    Outpatient Medications Prior to Visit  Medication Sig Dispense Refill   acetaminophen (TYLENOL) 325 MG tablet Take 650 mg by mouth every 6 (six) hours as needed.     ALPRAZolam (XANAX) 1 MG tablet TAKE 1 TO 2 TABLETS BY MOUTH TWICE DAILY AS NEEDED 120 tablet 1   Azelastine HCl (ASTEPRO) 0.15 % SOLN 2 sprays each nostril every 12 hours 30 mL 11   calcium carbonate (OSCAL) 1500 (600 Ca) MG TABS tablet Take by mouth daily.     Cholecalciferol (VITAMIN D3) 5000 units CAPS Take 4 capsules by  mouth daily.      cyclobenzaprine (FLEXERIL) 5 MG tablet Take 5 mg by mouth at bedtime.     diclofenac Sodium (VOLTAREN) 1 % GEL Apply 4 g topically 4 (four) times daily.     doxylamine, Sleep, (UNISOM) 25 MG tablet Take 25 mg by mouth at bedtime as needed.     hydrocortisone 2.5 % cream Apply 1 application topically as needed. RECTAL ITCHING     levothyroxine (SYNTHROID) 75 MCG tablet Take 1 tablet by mouth once daily 90 tablet 0   meloxicam (MOBIC) 7.5 MG tablet Take 2 tablets by mouth  once daily (Patient taking differently: 7.5 mg daily.) 180 tablet 0   metoprolol tartrate (LOPRESSOR) 25 MG tablet Take 1 tablet (25 mg total) by mouth 2 (two) times daily. 180 tablet 3   Multiple Vitamins-Iron (MULTIVITAMIN/IRON PO) Take 1 tablet by mouth daily.     NIACIN PO Take by mouth daily.      omeprazole (PRILOSEC) 40 MG capsule Take 1 capsule (40 mg total) by mouth daily. 30 capsule 3   ondansetron (ZOFRAN) 8 MG tablet Take 1 tablet (8 mg total) by mouth every 8 (eight) hours as needed for nausea or vomiting. 30 tablet 3   Oxycodone HCl 10 MG TABS 10 mg every 4 (four) hours as needed.      simvastatin (ZOCOR) 20 MG tablet Take 1 tablet (20 mg total) by mouth at bedtime. 90 tablet 3   Specialty Vitamins Products (MAGNESIUM, AMINO ACID CHELATE,) 133 MG tablet Take 1 tablet by mouth daily.     tolterodine (DETROL LA) 4 MG 24 hr capsule      traZODone (DESYREL) 50 MG tablet Take 1 tablet by mouth at bedtime.  2   meclizine (ANTIVERT) 25 MG tablet Take 1 tablet (25 mg total) by mouth 3 (three) times daily as needed for dizziness. (Patient not taking: Reported on 09/04/2021) 45 tablet 1   gabapentin (NEURONTIN) 100 MG capsule Take 100 mg by mouth 3 (three) times daily. (Patient not taking: Reported on 09/04/2021)     promethazine (PHENERGAN) 25 MG tablet TAKE ONE TABLET BY MOUTH EVERY 6 HOURS AS NEEDED FOR NAUSEA AND VOMITING (Patient not taking: Reported on 09/04/2021) 30 tablet 1   No facility-administered  medications prior to visit.    Allergies  Allergen Reactions   Cymbalta [Duloxetine Hcl]     Facial edema   Iohexol      Code: HIVES, Desc: pt states her face and throat swells when administered IV contrast - KB, Onset Date: [id number]    Mepergan [Meperidine-Promethazine]     Cardiac arrest   Morphine Rash   Augmentin [Amoxicillin-Pot Clavulanate] Nausea Only   Citalopram Other (See Comments)    Jittery/heart racing    Hctz [Hydrochlorothiazide] Other (See Comments)    Too much urinating, felt dried out, etc--NO ALLERGY   Lexapro [Escitalopram Oxalate] Other (See Comments)    Jittery,heart racing   Lyrica [Pregabalin] Swelling    Face, hands, fingers, ankles   Promethazine Hcl    Valsartan     REACTION: pt states INTOL to Diovan    ROS As per HPI  PE: Vitals with BMI 09/04/2021 07/22/2021 05/05/2021  Height 5\' 6"  5\' 6"  5\' 6"   Weight 195 lbs 10 oz 205 lbs 10 oz 199 lbs 10 oz  BMI 31.59 58.5 27.78  Systolic 242 353 614  Diastolic 76 78 62  Pulse 52 68 59     Physical Exam  Exam chaperoned by Deveron Furlong, CMA. General: Alert and well-appearing. Affect: Pleasant but a bit anxious.  Lucid thought and speech. ERX:VQMG: no injection, icteris, swelling, or exudate.  EOMI, PERRLA. Mouth: lips without lesion/swelling.  Oral mucosa pink and moist. Oropharynx without erythema, exudate, or swelling.  CV: RRR, no m/r/g.   LUNGS: CTA bilat, nonlabored resps, good aeration in all lung fields. ABD: soft, ND, BS normal.  Mild diffuse tenderness to palpation.  No hepatospenomegaly or mass.  No bruits. EXT: no clubbing or cyanosis.  no edema.  Skin: No pallor, jaundice, or rash.   LABS:  Last CBC Lab Results  Component Value Date   WBC 8.3 04/10/2021   HGB 14.2 04/10/2021   HCT 42.9 04/10/2021   MCV 87.7 04/10/2021   MCH 29.0 04/10/2021   RDW 13.0 04/10/2021   PLT 207 04/10/2021   Lab Results  Component Value Date   IRON 92 04/10/2021   TIBC 374 04/10/2021    FERRITIN 202 27/11/5007   Last metabolic panel Lab Results  Component Value Date   GLUCOSE 84 04/10/2021   NA 138 04/10/2021   K 4.2 04/10/2021   CL 104 04/10/2021   CO2 27 04/10/2021   BUN 25 (H) 04/10/2021   CREATININE 0.88 04/10/2021   GFRNONAA >60 04/10/2021   CALCIUM 9.4 04/10/2021   PROT 7.2 04/10/2021   ALBUMIN 4.1 04/10/2021   LABGLOB 3.1 04/10/2021   AGRATIO 1.3 04/10/2021   BILITOT 0.5 04/10/2021   ALKPHOS 89 04/10/2021   AST 12 (L) 04/10/2021   ALT 16 04/10/2021   ANIONGAP 7 04/10/2021   IMPRESSION AND PLAN:  Nausea without vomiting.  Mild diffuse abdominal tenderness. Her symptoms are significantly worsened by elevated anxiety. I think the biggest contributor to the way she feels is her severe constipation.  This constipation is largely opioid-induced. I'm going to check CBC, c-Met, and lipase today. We will switch her back to Phenergan 25 mg, 1 every 6 hours as needed. Continue PPI daily Instructions: 1 cap full of MiraLAX every 4 hours and 1 fleets enema every 8 hours until you feel like you have had 2 large evacuations of stool. Then start the daily medication I sent to your pharmacy called lubiprostone--as directed on pill bottle (24 mcg bid).  An After Visit Summary was printed and given to the patient.  FOLLOW UP: Return in about 4 weeks (around 10/02/2021) for f/u constipation/nausea.  Signed:  Crissie Sickles, MD           09/04/2021  Addendum 09/04/2021: Baker Pierini not covered by patient's insurance. Prior authorization not required for Linzess, Movantik, or lactulose. I sent in prescription for Linzess.  Signed:  Crissie Sickles, MD           09/04/2021

## 2021-09-15 DIAGNOSIS — F329 Major depressive disorder, single episode, unspecified: Secondary | ICD-10-CM | POA: Diagnosis not present

## 2021-09-15 DIAGNOSIS — K5900 Constipation, unspecified: Secondary | ICD-10-CM | POA: Diagnosis not present

## 2021-09-15 DIAGNOSIS — M961 Postlaminectomy syndrome, not elsewhere classified: Secondary | ICD-10-CM | POA: Diagnosis not present

## 2021-09-15 DIAGNOSIS — G894 Chronic pain syndrome: Secondary | ICD-10-CM | POA: Diagnosis not present

## 2021-09-18 ENCOUNTER — Other Ambulatory Visit: Payer: Medicare Other

## 2021-09-28 ENCOUNTER — Other Ambulatory Visit: Payer: Self-pay | Admitting: Family Medicine

## 2021-09-28 NOTE — Telephone Encounter (Signed)
Pt has upcoming appt 1/13

## 2021-09-30 NOTE — Telephone Encounter (Signed)
Requesting: alprazolam Contract: 03/18/21 UDS:03/18/21 Last Visit:1216/22, acute Next Visit: 10/02/21 Last Refill: 07/31/21(120,1)  Please Advise. Med pending

## 2021-09-30 NOTE — Telephone Encounter (Signed)
Pt advised refill sent. °

## 2021-10-02 ENCOUNTER — Ambulatory Visit: Payer: Medicare Other | Admitting: Family Medicine

## 2021-10-05 ENCOUNTER — Encounter: Payer: Self-pay | Admitting: Family Medicine

## 2021-10-05 ENCOUNTER — Other Ambulatory Visit: Payer: Self-pay

## 2021-10-05 ENCOUNTER — Ambulatory Visit (INDEPENDENT_AMBULATORY_CARE_PROVIDER_SITE_OTHER): Payer: Medicare Other | Admitting: Family Medicine

## 2021-10-05 VITALS — BP 131/77 | HR 54 | Temp 97.6°F | Ht 66.0 in | Wt 194.6 lb

## 2021-10-05 DIAGNOSIS — F411 Generalized anxiety disorder: Secondary | ICD-10-CM

## 2021-10-05 DIAGNOSIS — F4323 Adjustment disorder with mixed anxiety and depressed mood: Secondary | ICD-10-CM

## 2021-10-05 DIAGNOSIS — R110 Nausea: Secondary | ICD-10-CM

## 2021-10-05 DIAGNOSIS — K5909 Other constipation: Secondary | ICD-10-CM | POA: Diagnosis not present

## 2021-10-05 NOTE — Progress Notes (Signed)
OFFICE VISIT  10/05/2021  CC:  Chief Complaint  Patient presents with   Follow-up    Constipation/nausea    HPI:    Patient is a 76 y.o. female who presents for 1 mo f/u constipation and nausea. A/P as of last visit: "Nausea without vomiting.  Mild diffuse abdominal tenderness. Her symptoms are significantly worsened by elevated anxiety. I think the biggest contributor to the way she feels is her severe constipation.  This constipation is largely opioid-induced. I'm going to check CBC, c-Met, and lipase today. We will switch her back to Phenergan 25 mg, 1 every 6 hours as needed. Continue PPI daily Instructions: 1 cap full of MiraLAX every 4 hours and 1 fleets enema every 8 hours until you feel like you have had 2 large evacuations of stool. Then start the daily medication I sent to your pharmacy called lubiprostone--as directed on pill bottle (24 mcg bid). Addendum 09/04/2021: Amitiza not covered by patient's insurance. Prior authorization not required for Linzess, Movantik, or lactulose. I sent in prescription for Linzess."  INTERIM HX: All labs normal last visit.  Says her nausea is a little better but still particularly bothersome in the morning when she lays in bed anticipating all that the day is going to involve. Just some abdominal discomfort but no actual abdominal pain or cramping.  She has not been vomiting any.  She describes a suboptimally healthy diet, does some comfort eating.  Still drinking about 20 ounces of Dr. Malachi Bonds a day, lots of 2% milk.  Not much water.  She never picked up the [place], apparently never got worried that this was sent in. However, bowels seem to have straightened out without this medication.  Bowel movements softer, occurring more regularly.  Patient under extreme stress with her chronically ill husband and some ill family members. She has no one to talk to about this, often cries feeling overwhelmed.  Some panic as well. Denies suicidal  or homicidal ideation. She has her faith, which is strong.  Past Medical History:  Diagnosis Date   Allergic rhinitis    Anxiety    Chest pain    cr   Chronic gastritis    dx'd by EGD 2018 (erosive, likely NSAID-induced). 06/2019 +gastritis.   Chronic pain syndrome    right knee osteo, hx of TKA in R knee.  Pt was told by [name] that no further surgical options are available for her; also, chronic lower back pain.--[name].   Chronic renal insufficiency, stage II (mild) 2015   vs acute fluctuations depending on hydration?---GFR from 60s up to 80s.   Diverticulosis of colon    with hx of 'itis   DJD (degenerative joint disease)    L spine and knees.  R knee inject 03/15/18, L knee inject 12/26/18 (by Dr. Lynann Bologna).   Epigastric pain    As of 01/2017, GI (Dr. Loletha Carrow) planned EGD.   FATIGUE / MALAISE 07/01/2009   Chronic   GERD (gastroesophageal reflux disease)    History of colonic polyps    HTN (hypertension)    Hypercholesterolemia    recommended restart of simvastatin 07/2018   Hypothyroid 07/05/2012   IBS (irritable bowel syndrome)    IC (interstitial cystitis)    [name] at [facility]   Iron deficiency anemia, unspecified 06/27/2013   Has required IV iron on multiple occasions ([name]).  Hemoccults neg 12/2016. Most recent hem f/u labs normal 09/2020.   Lumbar back pain    Spinal stenosis,  lumbar region with neurogenic claudication L5-S1 sx's--ESI's no help.  CT L spine stable post-surg appearance as of 12/2019   Major depression, recurrent ([place])    vs dysthymia,chronic   Memory impairment 10/03/2012   Memory impairment 10/03/2012 >referral to neuro     Monoclonal gammopathy of undetermined significance    IgM kappa MGUS ([name]) routine f/u has shown stability of this problem (most recent f/u 09/2020)   Neuromuscular disorder ([place])    OSA (obstructive sleep apnea)    Latest sleep study 01/2017.  Some adjustments to CPAP being made+ dental referral  for consideration of oral appliance --per pulm MD.   Osteopenia 04/2014; 03/2018   2015: T score -2.0 FRAX 11%/1.8%.  2019 FRAX 10%/1.5%.   Otitis externa, candida 15/9470   [name] tx'd her with clotrimazole 1% external solution x 1 mo   Palpitations    Psoriasis    Trochanteric bursitis of both hips    + injections by ortho in the past   Vertigo 2020/21   6+ wks, ->to ENT->?vestib neuronitis with ongoing motion sensitivity, gradually resolving.([name] 7/61/51). MRI brain normal 09/2020.   Vitamin D deficiency    Xerostomia    and xerophthalmia--per [name] (he suggests SSA, SSB, ESR, rh factor, ANA, and antimicrosmal and antithyroglobulin ab's as of 02/17/16--all these labs were NORMAL.    Past Surgical History:  Procedure Laterality Date   ABDOMINAL HYSTERECTOMY     for prolapse with abdominal incision   BACK SURGERY  01/23/2015   Lumbar decompression with L2-L5 fusion ([name])   [place]     no PCI   CHOLECYSTECTOMY     COLONOSCOPY  04/2006;07/16/19   Diverticulosis, o/w normal.  06/2019->2 adenomas, +Diverticulosis.  Recall 2023-2025.   DEXA  04/06/2018   T score -1.5 femur neck, -2.2 radius.  FRAX 10%/1.5%.  Repeat DEXA 2 yrs.   ESOPHAGOGASTRODUODENOSCOPY  05/29/2009; 03/01/17;07/16/19   Esoph normal.  Gastritis (H pylori NEG) + gastric polyps (benign).  Duodenal biopsy NORMAL.  2018-mild chronic gastritis, H pylori NEG. 06/2019 tortuous esophagus w/out stenosis +chronic gastritis H pyl neg.   EYE SURGERY Left    "lazy eye"   HAND SURGERY Right    TRIGGER FINGER; index finger   HERNIA REPAIR     umbilical   KNEE ARTHROSCOPY     RIGHT KNEE X 5   LAMINOTOMY  2010   L-4, L-5 FUSION   NASAL SINUS SURGERY     polypectomy (remote past)   OOPHORECTOMY  2010   Laparoscopic BSO (path benign)   PATELLA FRACTURE SURGERY  10-08   [name]   POSTERIOR LAMINECTOMY / DECOMPRESSION LUMBAR SPINE  9-09   [name]   TOTAL KNEE ARTHROPLASTY  12-07 and 4-07    [name], revision 12-09 [name] at [facility]    Outpatient Medications Prior to Visit  Medication Sig Dispense Refill   acetaminophen (TYLENOL) 325 MG tablet Take 650 mg by mouth every 6 (six) hours as needed.     ALPRAZolam (XANAX) 1 MG tablet TAKE 1 TO 2 TABLETS BY MOUTH TWICE DAILY AS NEEDED 120 tablet 5   Azelastine HCl (ASTEPRO) 0.15 % SOLN 2 sprays each nostril every 12 hours 30 mL 11   calcium carbonate (OSCAL) 1500 (600 Ca) MG TABS tablet Take by mouth daily.     celecoxib (CELEBREX) 200 MG capsule Take 200 mg by mouth daily.     Cholecalciferol (VITAMIN D3) 5000 units CAPS Take 4 capsules by mouth  daily.      diclofenac Sodium (VOLTAREN) 1 % GEL Apply 4 g topically 4 (four) times daily.     doxylamine, Sleep, (UNISOM) 25 MG tablet Take 25 mg by mouth at bedtime as needed.     hydrocortisone 2.5 % cream Apply 1 application topically as needed. RECTAL ITCHING     levothyroxine (SYNTHROID) 75 MCG tablet Take 1 tablet by mouth once daily 90 tablet 0   meclizine (ANTIVERT) 25 MG tablet Take 1 tablet (25 mg total) by mouth 3 (three) times daily as needed for dizziness. 45 tablet 1   metoprolol tartrate (LOPRESSOR) 25 MG tablet Take 1 tablet (25 mg total) by mouth 2 (two) times daily. 180 tablet 3   Multiple Vitamins-Iron (MULTIVITAMIN/IRON PO) Take 1 tablet by mouth daily.     omeprazole (PRILOSEC) 40 MG capsule Take 1 capsule (40 mg total) by mouth daily. 30 capsule 3   Oxycodone HCl 10 MG TABS 10 mg every 4 (four) hours as needed.      Specialty Vitamins Products (MAGNESIUM, AMINO ACID CHELATE,) 133 MG tablet Take 1 tablet by mouth daily.     linaclotide (LINZESS) 145 MCG CAPS capsule Take 1 capsule (145 mcg total) by mouth daily before breakfast. 30 capsule 6   cyclobenzaprine (FLEXERIL) 5 MG tablet Take 5 mg by mouth at bedtime. (Patient not taking: Reported on 10/05/2021)     meloxicam (MOBIC) 7.5 MG tablet Take 2 tablets by mouth once daily (Patient not taking: Reported on 10/05/2021)  180 tablet 0   NIACIN PO Take by mouth daily.  (Patient not taking: Reported on 10/05/2021)     ondansetron (ZOFRAN) 8 MG tablet Take 1 tablet (8 mg total) by mouth every 8 (eight) hours as needed for nausea or vomiting. (Patient not taking: Reported on 10/05/2021) 30 tablet 3   promethazine (PHENERGAN) 25 MG tablet TAKE ONE TABLET BY MOUTH EVERY 6 HOURS AS NEEDED FOR NAUSEA AND VOMITING (Patient not taking: Reported on 10/05/2021) 30 tablet 3   simvastatin (ZOCOR) 20 MG tablet Take 1 tablet (20 mg total) by mouth at bedtime. (Patient not taking: Reported on 10/05/2021) 90 tablet 3   tolterodine (DETROL LA) 4 MG 24 hr capsule  (Patient not taking: Reported on 10/05/2021)     traZODone (DESYREL) 50 MG tablet Take 1 tablet by mouth at bedtime. (Patient not taking: Reported on 10/05/2021)  2   No facility-administered medications prior to visit.    Allergies  Allergen Reactions   Cymbalta [Duloxetine Hcl]     Facial edema   Iohexol      Code: HIVES, Desc: pt states her face and throat swells when administered IV contrast - KB, Onset Date: 12458099    Mepergan [Meperidine-Promethazine]     Cardiac arrest   Morphine Rash   Augmentin [Amoxicillin-Pot Clavulanate] Nausea Only   Citalopram Other (See Comments)    Jittery/heart racing    Hctz [Hydrochlorothiazide] Other (See Comments)    Too much urinating, felt dried out, etc--NO ALLERGY   Lexapro [Escitalopram Oxalate] Other (See Comments)    Jittery,heart racing   Lyrica [Pregabalin] Swelling    Face, hands, fingers, ankles   Promethazine Hcl    Valsartan     REACTION: pt states INTOL to Diovan    ROS As per HPI  PE: Vitals with BMI 10/05/2021 09/04/2021 07/22/2021  Height _0  _1  _2   Weight 194 lbs 10 oz 195 lbs 10 oz 205 lbs 10 oz  BMI 31.42 31.59 33.2  Systolic 052 591 028  Diastolic 77 76 78  Pulse 54 52 68     Physical Exam  Gen: alert, nontoxic, appears quite tired. Tearful during visit.  Lucid thought and  speech.  LABS:  Last CBC Lab Results  Component Value Date   WBC 7.1 09/04/2021   HGB 15.0 09/04/2021   HCT 45.0 09/04/2021   MCV 84.9 09/04/2021   MCH 29.0 04/10/2021   RDW 14.1 09/04/2021   PLT 201.0 90/22/8406   Last metabolic panel Lab Results  Component Value Date   GLUCOSE 86 09/04/2021   NA 140 09/04/2021   K 4.3 09/04/2021   CL 102 09/04/2021   CO2 30 09/04/2021   BUN 17 09/04/2021   CREATININE 1.04 09/04/2021   GFRNONAA >60 04/10/2021   CALCIUM 9.7 09/04/2021   PROT 7.2 09/04/2021   ALBUMIN 4.4 09/04/2021   LABGLOB 3.1 04/10/2021   AGRATIO 1.3 04/10/2021   BILITOT 0.9 09/04/2021   ALKPHOS 92 09/04/2021   AST 16 09/04/2021   ALT 16 09/04/2021   ANIONGAP 7 04/10/2021   Last thyroid functions Lab Results  Component Value Date   TSH 1.31 05/05/2021   T3TOTAL 137 06/26/2020   Lab Results  Component Value Date   LIPASE 8.0 (L) 09/04/2021   IMPRESSION AND PLAN:  #1 nausea, chronic.  No vomiting.  This is felt to be almost certainly due to extreme levels of anxiety/stress. She knows she needs to eat healthier and drink more water.  Continue daily omeprazole 40 mg. She has Phenergan to take as needed.  #2 chronic constipation, at least partially opioid induced. Improved since last visit.  Continue as needed MiraLAX and stool softener. Can add Linzess in the future if needed.  #3, chronic anxiety.  Superimposed adjustment disorder with mixed anxious and depressed mood. She talked about this in detail today and I listened and gave emotional support. She has had intolerance to multiple SSRIs and SNRIs in the past.  Continue alprazolam twice daily.  Spent 30 min with pt today reviewing HPI, reviewing relevant past history, doing exam, reviewing and discussing lab and imaging data, and formulating plans.  An After Visit Summary was printed and given to the patient.  FOLLOW UP: Return in about 1 week (around 10/12/2021) for f/u anxiety/stress.  Signed:  Crissie Sickles, MD           10/05/2021

## 2021-10-12 ENCOUNTER — Inpatient Hospital Stay: Payer: Medicare Other | Attending: Hematology & Oncology

## 2021-10-12 ENCOUNTER — Inpatient Hospital Stay: Payer: Medicare Other | Admitting: Hematology & Oncology

## 2021-10-14 ENCOUNTER — Telehealth: Payer: Self-pay

## 2021-10-14 ENCOUNTER — Ambulatory Visit: Payer: Medicare Other | Admitting: Family Medicine

## 2021-10-14 NOTE — Telephone Encounter (Signed)
Pt was supposed to have scheduled appt today at 2. Provider verbally made aware pt would not be able to make it. He does not want to do NS fee

## 2021-10-15 ENCOUNTER — Telehealth: Payer: Self-pay | Admitting: Family Medicine

## 2021-10-15 NOTE — Telephone Encounter (Signed)
Cancelled visit and will not charge. Thank you.

## 2021-10-15 NOTE — Telephone Encounter (Signed)
Received call from [place], spouse, stating pt out of oxycodone and wanting Dr. [name] to fill as they cannot get thru when calling [facility]. I advised [name] Dr. [name] not in office today. I called [facility] and spoke to [place]. She said pt is due to return 1/30 for appt/refills and should not be out. Apolonio Schneiders to call the pt.

## 2021-10-19 DIAGNOSIS — F329 Major depressive disorder, single episode, unspecified: Secondary | ICD-10-CM | POA: Diagnosis not present

## 2021-10-19 DIAGNOSIS — G894 Chronic pain syndrome: Secondary | ICD-10-CM | POA: Diagnosis not present

## 2021-10-19 DIAGNOSIS — M961 Postlaminectomy syndrome, not elsewhere classified: Secondary | ICD-10-CM | POA: Diagnosis not present

## 2021-10-19 DIAGNOSIS — K5900 Constipation, unspecified: Secondary | ICD-10-CM | POA: Diagnosis not present

## 2021-10-22 ENCOUNTER — Other Ambulatory Visit: Payer: Self-pay

## 2021-10-22 ENCOUNTER — Encounter: Payer: Self-pay | Admitting: Family Medicine

## 2021-10-22 ENCOUNTER — Ambulatory Visit (INDEPENDENT_AMBULATORY_CARE_PROVIDER_SITE_OTHER): Payer: Medicare Other | Admitting: Family Medicine

## 2021-10-22 VITALS — BP 111/75 | HR 58 | Temp 97.5°F | Ht 66.0 in | Wt 194.6 lb

## 2021-10-22 DIAGNOSIS — E2839 Other primary ovarian failure: Secondary | ICD-10-CM

## 2021-10-22 DIAGNOSIS — E7800 Pure hypercholesterolemia, unspecified: Secondary | ICD-10-CM

## 2021-10-22 DIAGNOSIS — I10 Essential (primary) hypertension: Secondary | ICD-10-CM

## 2021-10-22 DIAGNOSIS — Z1231 Encounter for screening mammogram for malignant neoplasm of breast: Secondary | ICD-10-CM | POA: Diagnosis not present

## 2021-10-22 DIAGNOSIS — E559 Vitamin D deficiency, unspecified: Secondary | ICD-10-CM | POA: Diagnosis not present

## 2021-10-22 LAB — VITAMIN D 25 HYDROXY (VIT D DEFICIENCY, FRACTURES): VITD: 36.59 ng/mL (ref 30.00–100.00)

## 2021-10-22 NOTE — Progress Notes (Addendum)
OFFICE VISIT  10/22/2021  CC:  Chief Complaint  Patient presents with   Follow-up    RCI; pt is fasting   HPI:    Patient is a 76 y.o. female who presents for f/u HTN, HLD, and anxiety (high risk med use). I last saw her 2 wks ago. A/P as of that visit: "#1 nausea, chronic.  No vomiting.  This is felt to be almost certainly due to extreme levels of anxiety/stress. She knows she needs to eat healthier and drink more water.  Continue daily omeprazole 40 mg. She has Phenergan to take as needed.   #2 chronic constipation, at least partially opioid induced. Improved since last visit.  Continue as needed MiraLAX and stool softener. Can add Linzess in the future if needed.   #3, chronic anxiety.  Superimposed adjustment disorder with mixed anxious and depressed mood. She talked about this in detail today and I listened and gave emotional support. She has had intolerance to multiple SSRIs and SNRIs in the past.  Continue alprazolam twice daily."  INTERIM HX: Patient is doing okay. She continues to sometimes feel nausea and constipation but her symptoms are relieved with medication.  Chronic waxing/waning nausea ---pt always connects this with her high anxiety.  No abd pain. Her mood is currently stable at her baseline; however, she does sometimes struggle with depressed mood and decreased energy. She is still able to manage her mood symptoms with Alprazolam and her faith.   Past Medical History:  Diagnosis Date   Allergic rhinitis    Anxiety    Chest pain    cr   Chronic gastritis    dx'd by EGD 2018 (erosive, likely NSAID-induced). 06/2019 +gastritis.   Chronic pain syndrome    right knee osteo, hx of TKA in R knee.  Pt was told by [name] that no further surgical options are available for her; also, chronic lower back pain.--[name].   Chronic renal insufficiency, stage II (mild) 2015   vs acute fluctuations depending on hydration?---GFR from 60s up to 80s.    Diverticulosis of colon    with hx of 'itis   DJD (degenerative joint disease)    L spine and knees.  R knee inject 03/15/18, L knee inject 12/26/18 (by Dr. Charlett Blake).   Epigastric pain    As of 01/2017, GI (Dr. Myrtie Neither) planned EGD.   FATIGUE / MALAISE 07/01/2009   Chronic   GERD (gastroesophageal reflux disease)    History of colonic polyps    HTN (hypertension)    Hypercholesterolemia    recommended restart of simvastatin 07/2018   Hypothyroid 07/05/2012   IBS (irritable bowel syndrome)    IC (interstitial cystitis)    [name] at [facility]   Iron deficiency anemia, unspecified 06/27/2013   Has required IV iron on multiple occasions (Dr. Myna Hidalgo).  Hemoccults neg 12/2016. Most recent hem f/u labs normal 09/2020.   Lumbar back pain    Spinal stenosis, lumbar region with neurogenic claudication L5-S1 sx's--ESI's no help.  CT L spine stable post-surg appearance as of 12/2019   Major depression, recurrent (HCC)    vs dysthymia,chronic   Memory impairment 10/03/2012   Memory impairment 10/03/2012 >referral to neuro     Monoclonal gammopathy of undetermined significance    IgM kappa MGUS (Dr. Myna Hidalgo) routine f/u has shown stability of this problem (most recent f/u 09/2020)   Neuromuscular disorder (HCC)    OSA (obstructive sleep apnea)    Latest sleep study 01/2017.  Some adjustments to CPAP being made+ dental referral for consideration of oral appliance --per pulm MD.   Osteopenia 04/2014; 03/2018   2015: T score -2.0 FRAX 11%/1.8%.  2019 FRAX 10%/1.5%.   Otitis externa, candida 79/4801   [name] tx'd her with clotrimazole 1% external solution x 1 mo   Palpitations    Psoriasis    Trochanteric bursitis of both hips    + injections by ortho in the past   Vertigo 2020/21   6+ wks, ->to ENT->?vestib neuronitis with ongoing motion sensitivity, gradually resolving.([name] 6/55/37). MRI brain normal 09/2020.   Vitamin D deficiency    Xerostomia    and xerophthalmia--per [name] (he  suggests SSA, SSB, ESR, rh factor, ANA, and antimicrosmal and antithyroglobulin ab's as of 02/17/16--all these labs were NORMAL.    Past Surgical History:  Procedure Laterality Date   ABDOMINAL HYSTERECTOMY     for prolapse with abdominal incision   BACK SURGERY  01/23/2015   Lumbar decompression with L2-L5 fusion ([name])   Surprise     no PCI   CHOLECYSTECTOMY     COLONOSCOPY  04/2006;07/16/19   Diverticulosis, o/w normal.  06/2019->2 adenomas, +Diverticulosis.  Recall 2023-2025.   DEXA  04/06/2018   T score -1.5 femur neck, -2.2 radius.  FRAX 10%/1.5%.  Repeat DEXA 2 yrs.   ESOPHAGOGASTRODUODENOSCOPY  05/29/2009; 03/01/17;07/16/19   Esoph normal.  Gastritis (H pylori NEG) + gastric polyps (benign).  Duodenal biopsy NORMAL.  2018-mild chronic gastritis, H pylori NEG. 06/2019 tortuous esophagus w/out stenosis +chronic gastritis H pyl neg.   EYE SURGERY Left    "lazy eye"   HAND SURGERY Right    TRIGGER FINGER; index finger   HERNIA REPAIR     umbilical   KNEE ARTHROSCOPY     RIGHT KNEE X 5   LAMINOTOMY  2010   L-4, L-5 FUSION   NASAL SINUS SURGERY     polypectomy (remote past)   OOPHORECTOMY  2010   Laparoscopic BSO (path benign)   PATELLA FRACTURE SURGERY  10-08   [name]   POSTERIOR LAMINECTOMY / DECOMPRESSION LUMBAR SPINE  9-09   [name]   TOTAL KNEE ARTHROPLASTY  12-07 and 4-07   [name], revision 12-09 [name] at [facility]    Outpatient Medications Prior to Visit  Medication Sig Dispense Refill   acetaminophen (TYLENOL) 325 MG tablet Take 650 mg by mouth every 6 (six) hours as needed.     ALPRAZolam (XANAX) 1 MG tablet TAKE 1 TO 2 TABLETS BY MOUTH TWICE DAILY AS NEEDED 120 tablet 5   Azelastine HCl (ASTEPRO) 0.15 % SOLN 2 sprays each nostril every 12 hours 30 mL 11   calcium carbonate (OSCAL) 1500 (600 Ca) MG TABS tablet Take by mouth daily.     Cholecalciferol (VITAMIN D3) 5000 units CAPS Take 4 capsules by mouth daily.      diclofenac Sodium  (VOLTAREN) 1 % GEL Apply 4 g topically 4 (four) times daily.     doxylamine, Sleep, (UNISOM) 25 MG tablet Take 25 mg by mouth at bedtime as needed.     hydrocortisone 2.5 % cream Apply 1 application topically as needed. RECTAL ITCHING     levothyroxine (SYNTHROID) 75 MCG tablet Take 1 tablet by mouth once daily 90 tablet 0   meclizine (ANTIVERT) 25 MG tablet Take 1 tablet (25 mg total) by mouth 3 (three) times daily as needed for dizziness. 45 tablet 1   metoprolol tartrate (LOPRESSOR) 25 MG  tablet Take 1 tablet (25 mg total) by mouth 2 (two) times daily. 180 tablet 3   Multiple Vitamins-Iron (MULTIVITAMIN/IRON PO) Take 1 tablet by mouth daily.     Oxycodone HCl 20 MG TABS TAKE 1/2 TABLET BY MOUTH EVERY 4 HOURS AS NEEDED FOR PAIN. Qty 90 with 1 refill as of 10/19/21     promethazine (PHENERGAN) 25 MG tablet TAKE ONE TABLET BY MOUTH EVERY 6 HOURS AS NEEDED FOR NAUSEA AND VOMITING 30 tablet 3   Specialty Vitamins Products (MAGNESIUM, AMINO ACID CHELATE,) 133 MG tablet Take 1 tablet by mouth daily.     celecoxib (CELEBREX) 200 MG capsule Take 200 mg by mouth daily.     cyclobenzaprine (FLEXERIL) 5 MG tablet Take 5 mg by mouth at bedtime. (Patient not taking: Reported on 10/05/2021)     meloxicam (MOBIC) 7.5 MG tablet Take 2 tablets by mouth once daily (Patient not taking: Reported on 10/05/2021) 180 tablet 0   NIACIN PO Take by mouth daily.  (Patient not taking: Reported on 10/05/2021)     omeprazole (PRILOSEC) 40 MG capsule Take 1 capsule (40 mg total) by mouth daily. (Patient not taking: Reported on 10/22/2021) 30 capsule 3   ondansetron (ZOFRAN) 8 MG tablet Take 1 tablet (8 mg total) by mouth every 8 (eight) hours as needed for nausea or vomiting. (Patient not taking: Reported on 10/05/2021) 30 tablet 3   Oxycodone HCl 10 MG TABS 10 mg every 4 (four) hours as needed.  (Patient not taking: Reported on 10/22/2021)     simvastatin (ZOCOR) 20 MG tablet Take 1 tablet (20 mg total) by mouth at bedtime. (Patient  not taking: Reported on 10/05/2021) 90 tablet 3   tolterodine (DETROL LA) 4 MG 24 hr capsule  (Patient not taking: Reported on 10/05/2021)     traZODone (DESYREL) 50 MG tablet Take 1 tablet by mouth at bedtime. (Patient not taking: Reported on 10/05/2021)  2   No facility-administered medications prior to visit.    Allergies  Allergen Reactions   Cymbalta [Duloxetine Hcl]     Facial edema   Iohexol      Code: HIVES, Desc: pt states her face and throat swells when administered IV contrast - KB, Onset Date: 83382505    Mepergan [Meperidine-Promethazine]     Cardiac arrest   Morphine Rash   Augmentin [Amoxicillin-Pot Clavulanate] Nausea Only   Citalopram Other (See Comments)    Jittery/heart racing    Hctz [Hydrochlorothiazide] Other (See Comments)    Too much urinating, felt dried out, etc--NO ALLERGY   Lexapro [Escitalopram Oxalate] Other (See Comments)    Jittery,heart racing   Lyrica [Pregabalin] Swelling    Face, hands, fingers, ankles   Promethazine Hcl    Valsartan     REACTION: pt states INTOL to Diovan    Review of Systems  Constitutional:  Negative for chills, fever and weight loss.  Psychiatric/Behavioral:  Negative for hallucinations and suicidal ideas.    PE: Vitals with BMI 10/22/2021 10/05/2021 09/04/2021  Height $Remov'5\' 6"'XzpIui$  $Remove'5\' 6"'dzmUmov$  $RemoveB'5\' 6"'qlWfRvlA$   Weight 194 lbs 10 oz 194 lbs 10 oz 195 lbs 10 oz  BMI 31.42 39.76 73.41  Systolic 937 902 409  Diastolic 75 77 76  Pulse 58 54 52     Physical Exam Constitutional:      Appearance: Normal appearance.  Cardiovascular:     Rate and Rhythm: Normal rate and regular rhythm.     Heart sounds: Normal heart sounds.  Pulmonary:  Effort: Pulmonary effort is normal.     Breath sounds: Normal breath sounds.  Neurological:     Mental Status: She is alert.  Psychiatric:        Mood and Affect: Mood normal.        Behavior: Behavior normal.   LABS:  Last CBC Lab Results  Component Value Date   WBC 7.1 09/04/2021   HGB 15.0  09/04/2021   HCT 45.0 09/04/2021   MCV 84.9 09/04/2021   MCH 29.0 04/10/2021   RDW 14.1 09/04/2021   PLT 201.0 34/19/6222   Last metabolic panel Lab Results  Component Value Date   GLUCOSE 86 09/04/2021   NA 140 09/04/2021   K 4.3 09/04/2021   CL 102 09/04/2021   CO2 30 09/04/2021   BUN 17 09/04/2021   CREATININE 1.04 09/04/2021   GFRNONAA >60 04/10/2021   CALCIUM 9.7 09/04/2021   PROT 7.2 09/04/2021   ALBUMIN 4.4 09/04/2021   LABGLOB 3.1 04/10/2021   AGRATIO 1.3 04/10/2021   BILITOT 0.9 09/04/2021   ALKPHOS 92 09/04/2021   AST 16 09/04/2021   ALT 16 09/04/2021   ANIONGAP 7 04/10/2021   Last lipids Lab Results  Component Value Date   CHOL 208 (H) 04/21/2021   HDL 45.70 04/21/2021   LDLCALC 123 (H) 04/21/2021   LDLDIRECT 105.0 06/26/2020   TRIG 197.0 (H) 04/21/2021   CHOLHDL 5 04/21/2021   Last hemoglobin A1c No results found for: HGBA1C Last thyroid functions Lab Results  Component Value Date   TSH 1.31 05/05/2021   T3TOTAL 137 06/26/2020   IMPRESSION AND PLAN: Non-toxic appearing female who presents today for f/u for nausea, constipation, and mood.  #1 nausea, chronic.  Mostly anxiety-related but GERD contributing.  Reasonably controlled.  Patient continues to experience these symptoms; however, it is controlled with Omeprazole 40 mg and Phenergan 25 mg PRN. No changes to medication at this time.   #2 chronic constipation, at least partially opioid induced. Still continues to improve. Continue as needed MiraLAX and stool softener.   #3, chronic anxiety.  Superimposed adjustment disorder with mixed anxious and depressed mood. Stable. Patient is at her personal baseline for her mood. She is able to carry out everyday activities, eat fine. Sleep persistently interrupted d/t husband's frequent needs at all hours. Continue to take Alprazolam BID and 1/2 $Remov'50mg'LDxVpi$  trazdone hx. She has had intolerance to multiple SSRIs and SNRIs in the past. Lipids today.  #4  Preventative health care: Breast cancer screening--due for mammogram--ordered. Osteoporosis screening--due for DEXA-ordered.  Hx vit D def-->she is taking 5000 U vit D daily-->check vit D level today. Vaccines: Shingrix->deferred for now.  Otherwise UTD. Colon cancer screening-recall 2023-2025 timeframe.  An After Visit Summary was printed and given to the patient.  FOLLOW UP: Return in about 4 weeks (around 11/19/2021) for routine chronic illness f/u.  Phil Dopp - MS3  Signed:  Crissie Sickles, MD           10/22/2021

## 2021-10-22 NOTE — Progress Notes (Signed)
See student note for this encounter. I have attested it. Signed:  Crissie Sickles, MD           10/22/2021

## 2021-10-23 LAB — LDL CHOLESTEROL, DIRECT: Direct LDL: 207 mg/dL

## 2021-10-23 LAB — LIPID PANEL
Cholesterol: 289 mg/dL — ABNORMAL HIGH (ref 0–200)
HDL: 32.6 mg/dL — ABNORMAL LOW (ref >39.00)
NonHDL: 256.23
Total CHOL/HDL Ratio: 9
Triglycerides: 310 mg/dL — ABNORMAL HIGH (ref 0.0–149.0)
VLDL: 62 mg/dL — ABNORMAL HIGH (ref 0.0–40.0)

## 2021-11-05 ENCOUNTER — Ambulatory Visit: Payer: Medicare Other | Admitting: Endocrinology

## 2021-11-07 ENCOUNTER — Other Ambulatory Visit: Payer: Self-pay

## 2021-11-07 ENCOUNTER — Ambulatory Visit
Admission: RE | Admit: 2021-11-07 | Discharge: 2021-11-07 | Disposition: A | Discharge status: Home / Self Care | Payer: Medicare Other | Source: Ambulatory Visit | Attending: Orthopaedic Surgery | Admitting: Orthopaedic Surgery

## 2021-11-07 DIAGNOSIS — M25562 Pain in left knee: Secondary | ICD-10-CM | POA: Diagnosis not present

## 2021-11-10 ENCOUNTER — Inpatient Hospital Stay: Payer: Medicare Other | Attending: Hematology & Oncology

## 2021-11-10 ENCOUNTER — Other Ambulatory Visit: Payer: Self-pay

## 2021-11-10 ENCOUNTER — Inpatient Hospital Stay (HOSPITAL_BASED_OUTPATIENT_CLINIC_OR_DEPARTMENT_OTHER): Payer: Medicare Other | Admitting: Hematology & Oncology

## 2021-11-10 ENCOUNTER — Encounter: Payer: Self-pay | Admitting: Hematology & Oncology

## 2021-11-10 VITALS — BP 119/49 | HR 63 | Temp 97.8°F | Resp 17 | Wt 195.1 lb

## 2021-11-10 DIAGNOSIS — D62 Acute posthemorrhagic anemia: Secondary | ICD-10-CM | POA: Diagnosis not present

## 2021-11-10 DIAGNOSIS — D472 Monoclonal gammopathy: Secondary | ICD-10-CM

## 2021-11-10 DIAGNOSIS — D509 Iron deficiency anemia, unspecified: Secondary | ICD-10-CM | POA: Insufficient documentation

## 2021-11-10 DIAGNOSIS — D500 Iron deficiency anemia secondary to blood loss (chronic): Secondary | ICD-10-CM

## 2021-11-10 LAB — CMP (CANCER CENTER ONLY)
ALT: 17 U/L (ref 0–44)
AST: 17 U/L (ref 15–41)
Albumin: 4 g/dL (ref 3.5–5.0)
Alkaline Phosphatase: 97 U/L (ref 38–126)
Anion gap: 8 (ref 5–15)
BUN: 17 mg/dL (ref 8–23)
CO2: 28 mmol/L (ref 22–32)
Calcium: 8.9 mg/dL (ref 8.9–10.3)
Chloride: 103 mmol/L (ref 98–111)
Creatinine: 1 mg/dL (ref 0.44–1.00)
GFR, Estimated: 59 mL/min — ABNORMAL LOW (ref >60)
Glucose, Bld: 120 mg/dL — ABNORMAL HIGH (ref 70–99)
Potassium: 4 mmol/L (ref 3.5–5.1)
Sodium: 139 mmol/L (ref 135–145)
Total Bilirubin: 0.6 mg/dL (ref 0.3–1.2)
Total Protein: 7.2 g/dL (ref 6.5–8.1)

## 2021-11-10 LAB — FERRITIN: Ferritin: 206 ng/mL (ref 11–307)

## 2021-11-10 LAB — CBC WITH DIFFERENTIAL (CANCER CENTER ONLY)
Abs Immature Granulocytes: 0.03 10*3/uL (ref 0.00–0.07)
Basophils Absolute: 0.1 10*3/uL (ref 0.0–0.1)
Basophils Relative: 1 %
Eosinophils Absolute: 0.3 10*3/uL (ref 0.0–0.5)
Eosinophils Relative: 4 %
HCT: 43.8 % (ref 36.0–46.0)
Hemoglobin: 14.6 g/dL (ref 12.0–15.0)
Immature Granulocytes: 0 %
Lymphocytes Relative: 29 %
Lymphs Abs: 2.2 10*3/uL (ref 0.7–4.0)
MCH: 28.8 pg (ref 26.0–34.0)
MCHC: 33.3 g/dL (ref 30.0–36.0)
MCV: 86.4 fL (ref 80.0–100.0)
Monocytes Absolute: 0.4 10*3/uL (ref 0.1–1.0)
Monocytes Relative: 6 %
Neutro Abs: 4.6 10*3/uL (ref 1.7–7.7)
Neutrophils Relative %: 60 %
Platelet Count: 219 10*3/uL (ref 150–400)
RBC: 5.07 MIL/uL (ref 3.87–5.11)
RDW: 12.8 % (ref 11.5–15.5)
WBC Count: 7.7 10*3/uL (ref 4.0–10.5)
nRBC: 0 % (ref 0.0–0.2)

## 2021-11-10 LAB — IRON AND IRON BINDING CAPACITY (CC-WL,HP ONLY)
Iron: 80 ug/dL (ref 28–170)
Saturation Ratios: 21 % (ref 10.4–31.8)
TIBC: 378 ug/dL (ref 250–450)
UIBC: 298 ug/dL (ref 148–442)

## 2021-11-10 LAB — LACTATE DEHYDROGENASE: LDH: 132 U/L (ref 98–192)

## 2021-11-10 NOTE — Progress Notes (Signed)
Hematology and Oncology Follow Up Visit  [name] [id number] May 08, 1946 76 y.o. 10/05/2018   Principle Diagnosis:  1. IgM kappa monoclonal gammopathy of undetermined significance 2. Intermittent iron deficiency anemia  Current Therapy:   IV iron as indicated - last received in January 2021    Interim History:  [name] is here today for follow-up.  We last saw her back in July 2022.  She has been put on quite a bit of weight since then.  She has been under a lot of stress.  Unfortunately, her husband, who actually had surgery for liver cancer last year was up on a ladder and fell.  He fell 10 feet.  He broke his back.  Needed surgery for this.  This was about a month ago.  She has been trying to help him.  He has been incredibly difficult to take care of.  He has always been very belligerent toward her.  We are following her MGUS.  The last time that we saw her, M spike was 0.5 g/dL.  The IgM level is 591 mg/dL.  The Kappa light chain was 1.9 mg/dL.  Her appetite obviously is quite good.  She has had no problems with cough or shortness of breath..  She is going for mammogram tomorrow.  She has had no leg swelling.  There has been no rashes.  She has had no change in bowel or bladder habits.  She has had no bleeding.  Overall, her performance status is ECOG 1.    Medications:  Allergies as of 10/05/2018       Reactions   Cymbalta [duloxetine Hcl]    Facial edema   Iohexol     Code: HIVES, Desc: pt states her face and throat swells when administered IV contrast - KB, Onset Date: [id number]   Mepergan [meperidine-promethazine]    Cardiac arrest   Morphine Rash   Augmentin [amoxicillin-pot Clavulanate] Nausea Only   Citalopram Other (See Comments)   Jittery/heart racing   Hctz [hydrochlorothiazide] Other (See Comments)   Too much urinating, felt dried out, etc--NO ALLERGY   Lexapro [escitalopram Oxalate] Other (See Comments)   Jittery,heart racing   Lyrica [pregabalin]  Swelling   Face, hands, fingers, ankles   Valsartan    REACTION: pt states INTOL to Diovan        Medication List        Accurate as of October 05, 2018 12:55 PM. Always use your most recent med list.          acetaminophen 325 MG tablet Commonly known as:  TYLENOL Take 650 mg by mouth every 6 (six) hours as needed.   ALIGN 4 MG Caps Take 1 capsule (4 mg total) by mouth daily.   ALPRAZolam 0.5 MG tablet Commonly known as:  XANAX TAKE ONE-HALF TO ONE TABLET BY MOUTH THREE TIMES DAILY   Azelastine HCl 0.15 % Soln Commonly known as:  ASTEPRO 2 sprays each nostril every 12 hours   calcium carbonate 1500 (600 Ca) MG Tabs tablet Commonly known as:  OSCAL Take by mouth daily.   dicyclomine 10 MG capsule Commonly known as:  BENTYL Take 1 capsule (10 mg total) by mouth 3 (three) times daily before meals.   hydrocortisone 2.5 % cream Apply 1 application topically as needed. RECTAL ITCHING   levothyroxine 75 MCG tablet Commonly known as:  SYNTHROID, LEVOTHROID TAKE 1/2 (ONE-HALF) TABLET BY MOUTH ONCE DAILY   magnesium (amino acid chelate) 133 MG tablet Take 1 tablet by  mouth daily.   meclizine 25 MG tablet Commonly known as:  ANTIVERT Take 1 tablet (25 mg total) by mouth every 6 (six) hours as needed for dizziness.   MELATONIN PO Take by mouth.   meloxicam 7.5 MG tablet Commonly known as:  MOBIC TAKE 2 TABLETS (15 MG TOTAL) BY MOUTH DAILY.   metoprolol tartrate 25 MG tablet Commonly known as:  LOPRESSOR Take 1 tablet (25 mg total) by mouth daily.   MULTIVITAMIN/IRON PO Take 1 tablet by mouth daily.   NIACIN PO Take by mouth.   omeprazole 40 MG capsule Commonly known as:  PRILOSEC Take 1 capsule (40 mg total) by mouth daily.   Oxycodone HCl 10 MG Tabs 10 mg every 4 (four) hours as needed. 1 every 6 hours for pain   promethazine 25 MG tablet Commonly known as:  PHENERGAN TAKE ONE TABLET BY MOUTH EVERY 6 HOURS AS NEEDED FOR NAUSEA AND VOMITING    RED YEAST RICE PO Take by mouth.   simvastatin 20 MG tablet Commonly known as:  ZOCOR Take 1 tablet (20 mg total) by mouth at bedtime.   SLEEP AID 25 MG tablet Generic drug:  doxylamine (Sleep) Take 25 mg by mouth at bedtime as needed.   traZODone 50 MG tablet Commonly known as:  DESYREL Take 1 tablet by mouth at bedtime.   Vitamin D3 125 MCG (5000 UT) Caps Take 2 capsules by mouth daily.        Allergies:  Allergies  Allergen Reactions   Cymbalta [Duloxetine Hcl]     Facial edema   Iohexol      Code: HIVES, Desc: pt states her face and throat swells when administered IV contrast - KB, Onset Date: [id number]    Mepergan [Meperidine-Promethazine]     Cardiac arrest   Morphine Rash   Augmentin [Amoxicillin-Pot Clavulanate] Nausea Only   Citalopram Other (See Comments)    Jittery/heart racing    Hctz [Hydrochlorothiazide] Other (See Comments)    Too much urinating, felt dried out, etc--NO ALLERGY   Lexapro [Escitalopram Oxalate] Other (See Comments)    Jittery,heart racing   Lyrica [Pregabalin] Swelling    Face, hands, fingers, ankles   Valsartan     REACTION: pt states INTOL to Diovan    Past Medical History, Surgical history, Social history, and Family History were reviewed and updated.  Review of Systems: Review of Systems  Constitutional: Positive for malaise/fatigue.  Eyes: Positive for blurred vision.  Respiratory: Positive for wheezing.   Cardiovascular: Positive for palpitations.  Gastrointestinal: Positive for nausea.  Genitourinary: Positive for urgency.  Musculoskeletal: Positive for back pain, joint pain and myalgias.  Neurological: Positive for tingling.  Endo/Heme/Allergies: Negative.   Psychiatric/Behavioral: Negative.      Physical Exam:  weight is 174 lb (78.9 kg). Her oral temperature is 97.8 F (36.6 C). Her blood pressure is 135/52 (abnormal) and her pulse is 52 (abnormal). Her respiration is 18 and oxygen saturation is 98%.   Wt  Readings from Last 3 Encounters:  10/05/18 174 lb (78.9 kg)  09/05/18 178 lb (80.7 kg)  07/24/18 174 lb 8 oz (79.2 kg)    Physical Exam Vitals signs reviewed.  HENT:     Head: Normocephalic and atraumatic.  Eyes:     Pupils: Pupils are equal, round, and reactive to light.  Neck:     Musculoskeletal: Normal range of motion.  Cardiovascular:     Rate and Rhythm: Normal rate and regular rhythm.     Heart sounds:  Normal heart sounds.  Pulmonary:     Effort: Pulmonary effort is normal.     Breath sounds: Normal breath sounds.  Abdominal:     General: Bowel sounds are normal.     Palpations: Abdomen is soft.  Musculoskeletal: Normal range of motion.        General: No tenderness or deformity.  Lymphadenopathy:     Cervical: No cervical adenopathy.  Skin:    General: Skin is warm and dry.     Findings: No erythema or rash.  Neurological:     Mental Status: She is alert and oriented to person, place, and time.  Psychiatric:        Behavior: Behavior normal.        Thought Content: Thought content normal.        Judgment: Judgment normal.     Lab Results  Component Value Date   WBC 14.1 (H) 10/05/2018   HGB 14.8 10/05/2018   HCT 45.9 10/05/2018   MCV 91.4 10/05/2018   PLT 260 10/05/2018   Lab Results  Component Value Date   FERRITIN 143 04/06/2018   IRON 31 (L) 04/06/2018   TIBC 284 04/06/2018   UIBC 253 04/06/2018   IRONPCTSAT 11 (L) 04/06/2018   Lab Results  Component Value Date   RETICCTPCT 1.2 01/20/2011   RBC 5.02 10/05/2018   RETICCTABS 52.2 01/20/2011   Lab Results  Component Value Date   KPAFRELGTCHN 17.3 04/06/2018   LAMBDASER 16.6 04/06/2018   KAPLAMBRATIO 1.04 04/06/2018   Lab Results  Component Value Date   IGGSERUM 667 (L) 04/06/2018   IGA 287 04/06/2018   IGMSERUM 630 (H) 04/06/2018   Lab Results  Component Value Date   TOTALPROTELP 6.7 04/06/2018   ALBUMINELP 3.3 04/06/2018   A1GS 0.2 04/06/2018   A2GS 0.9 04/06/2018   BETS 1.0  04/06/2018   BETA2SER 0.5 06/19/2015   GAMS 1.2 04/06/2018   MSPIKE 0.5 (H) 04/06/2018   SPEI Comment 10/06/2017     Chemistry      Component Value Date/Time   NA 142 10/05/2018 1108   NA 138 03/25/2017 1333   NA 138 11/25/2016 1147   K 3.8 10/05/2018 1108   K 4.3 03/25/2017 1333   K 3.8 11/25/2016 1147   CL 103 10/05/2018 1108   CL 104 03/25/2017 1333   CL 103 01/15/2010 0835   CO2 29 10/05/2018 1108   CO2 24 03/25/2017 1333   CO2 25 11/25/2016 1147   BUN 12 10/05/2018 1108   BUN 12 03/25/2017 1333   BUN 15.3 11/25/2016 1147   CREATININE 0.93 10/05/2018 1108   CREATININE 0.76 03/25/2017 1333   CREATININE 0.8 11/25/2016 1147      Component Value Date/Time   CALCIUM 9.4 10/05/2018 1108   CALCIUM 9.5 03/25/2017 1333   CALCIUM 9.2 11/25/2016 1147   ALKPHOS 104 10/05/2018 1108   ALKPHOS 111 03/25/2017 1333   ALKPHOS 99 11/25/2016 1147   AST 11 (L) 10/05/2018 1108   AST 24 11/25/2016 1147   ALT 14 10/05/2018 1108   ALT 30 11/25/2016 1147   BILITOT 0.5 10/05/2018 1108   BILITOT 0.61 11/25/2016 1147      Impression and Plan: Ms. Kingdon is a very pleasant 76 yo caucasian female with MGUS and history of iron deficiency.   I am sorry that she has to have all the stress.  I know she is managing as well she can.  She has a very strong faith.  I know her  faith will get her through difficulties that she has.  I think her biggest problem clearly is can be that her cholesterol.  Her cholesterol/HDL ratio was 9.  She really needs to stay with the program that she is on.  I know that her family doctor is doing a good job trying to help manage her cholesterol issues.  As always, we will plan to get her back in 6 months.     [name], MD 1/16/202012:55 PM

## 2021-11-11 ENCOUNTER — Ambulatory Visit (INDEPENDENT_AMBULATORY_CARE_PROVIDER_SITE_OTHER): Payer: Medicare Other | Admitting: Endocrinology

## 2021-11-11 ENCOUNTER — Other Ambulatory Visit: Payer: Self-pay

## 2021-11-11 VITALS — BP 140/86 | HR 61 | Ht 66.0 in | Wt 195.6 lb

## 2021-11-11 DIAGNOSIS — E039 Hypothyroidism, unspecified: Secondary | ICD-10-CM | POA: Diagnosis not present

## 2021-11-11 LAB — KAPPA/LAMBDA LIGHT CHAINS
Kappa free light chain: 26.4 mg/L — ABNORMAL HIGH (ref 3.3–19.4)
Kappa, lambda light chain ratio: 1.35 (ref 0.26–1.65)
Lambda free light chains: 19.6 mg/L (ref 5.7–26.3)

## 2021-11-11 LAB — T4, FREE: Free T4: 1.31 ng/dL (ref 0.60–1.60)

## 2021-11-11 LAB — TSH: TSH: 0.9 u[IU]/mL (ref 0.35–5.50)

## 2021-11-11 LAB — IGG, IGA, IGM
IgA: 246 mg/dL (ref 64–422)
IgG (Immunoglobin G), Serum: 659 mg/dL (ref 586–1602)
IgM (Immunoglobulin M), Srm: 544 mg/dL — ABNORMAL HIGH (ref 26–217)

## 2021-11-11 NOTE — Patient Instructions (Addendum)
Blood tests are requested for you today.  We'll let you know about the results.   It is best to never miss the levothyroxine.  However, if you do miss it, next best is to double up the next time.   Please come back for a follow-up appointment in 6 months.

## 2021-11-11 NOTE — Progress Notes (Signed)
Subjective:    Patient ID: [name], female    DOB: Aug 05, 1946, 76 y.o.   MRN: [id number]  HPI Pt returns for f/u of chronic primary hypothyroidism (dx'ed 2010; [place] (2015) was normal).  She has weight gain and hair loss. She takes synthroid as rx'ed.   Past Medical History:  Diagnosis Date   Allergic rhinitis    Anxiety    Chest pain    cr   Chronic gastritis    dx'd by EGD 2018 (erosive, likely NSAID-induced). 06/2019 +gastritis.   Chronic pain syndrome    right knee osteo, hx of TKA in R knee.  Pt was told by [name] that no further surgical options are available for her; also, chronic lower back pain.--[name].   Chronic renal insufficiency, stage II (mild) 2015   vs acute fluctuations depending on hydration?---GFR from 60s up to 80s.   Diverticulosis of colon    with hx of 'itis   DJD (degenerative joint disease)    L spine and knees.  R knee inject 03/15/18, L knee inject 12/26/18 (by Dr. Lynann Bologna).   Epigastric pain    As of 01/2017, GI (Dr. Loletha Carrow) planned EGD.   FATIGUE / MALAISE 07/01/2009   Chronic   GERD (gastroesophageal reflux disease)    History of colonic polyps    HTN (hypertension)    Hypercholesterolemia    recommended restart of simvastatin 07/2018   Hypothyroid 07/05/2012   IBS (irritable bowel syndrome)    IC (interstitial cystitis)    [name] at [facility]   Iron deficiency anemia, unspecified 06/27/2013   Has required IV iron on multiple occasions ([name]).  Hemoccults neg 12/2016. Most recent hem f/u labs normal 09/2020.   Lumbar back pain    Spinal stenosis, lumbar region with neurogenic claudication L5-S1 sx's--ESI's no help.  CT L spine stable post-surg appearance as of 12/2019   Major depression, recurrent ([place])    vs dysthymia,chronic   Memory impairment 10/03/2012   Memory impairment 10/03/2012 >referral to neuro     Monoclonal gammopathy of undetermined significance    IgM kappa MGUS ([name]) routine f/u has  shown stability of this problem (most recent f/u 09/2020)   Neuromuscular disorder ([place])    OSA (obstructive sleep apnea)    Latest sleep study 01/2017.  Some adjustments to CPAP being made+ dental referral for consideration of oral appliance --per pulm MD.   Osteopenia 04/2014; 03/2018   2015: T score -2.0 FRAX 11%/1.8%.  2019 FRAX 10%/1.5%.  10/2021 T score -1.6 radius, -0.9 femur   Otitis externa, candida 55/7322   [name] tx'd her with clotrimazole 1% external solution x 1 mo   Palpitations    Psoriasis    Trochanteric bursitis of both hips    + injections by ortho in the past   Vertigo 2020/21   6+ wks, ->to ENT->?vestib neuronitis with ongoing motion sensitivity, gradually resolving.([name] 0/25/42). MRI brain normal 09/2020.   Vitamin D deficiency    Xerostomia    and xerophthalmia--per [name] (he suggests SSA, SSB, ESR, rh factor, ANA, and antimicrosmal and antithyroglobulin ab's as of 02/17/16--all these labs were NORMAL.    Past Surgical History:  Procedure Laterality Date   ABDOMINAL HYSTERECTOMY     for prolapse with abdominal incision   BACK SURGERY  01/23/2015   Lumbar decompression with L2-L5 fusion ([name])   [place]     no PCI   CHOLECYSTECTOMY  COLONOSCOPY  04/2006;07/16/19  ° Diverticulosis, o/w normal.  06/2019->2 adenomas, +Diverticulosis.  Recall 2023-2025.  ° DEXA  04/06/2018  ° T score -1.5 femur neck, -2.2 radius.  FRAX 10%/1.5%.  10/2021 T score -1.6 radius, -0.9 femur. rpt 2 yrs  ° ESOPHAGOGASTRODUODENOSCOPY  05/29/2009; 03/01/17;07/16/19  ° Esoph normal.  Gastritis (H pylori NEG) + gastric polyps (benign).  Duodenal biopsy NORMAL.  2018-mild chronic gastritis, H pylori NEG. 06/2019 tortuous esophagus w/out stenosis +chronic gastritis H pyl neg.  ° EYE SURGERY Left   ° "lazy eye"  ° HAND SURGERY Right   ° TRIGGER FINGER; index finger  ° HERNIA REPAIR    ° umbilical  ° KNEE ARTHROSCOPY    ° RIGHT KNEE X 5  ° LAMINOTOMY  2010  ° L-4, L-5  FUSION  ° NASAL SINUS SURGERY    ° polypectomy (remote past)  ° OOPHORECTOMY  2010  ° Laparoscopic BSO (path benign)  ° PATELLA FRACTURE SURGERY  06/2007  ° Dr. Olin  ° POSTERIOR LAMINECTOMY / DECOMPRESSION LUMBAR SPINE  05/2008  ° Dr. Pool  ° TOTAL KNEE ARTHROPLASTY  12-07 and 4-07  ° Dr. Beane, revision 12-09 Dr. Jenna at [facility]  ° ° °Social History  ° °Socioeconomic History  ° Marital status: Married  °  Spouse name: [name] Noga  ° Number of children: 0  ° Years of education: Not on file  ° Highest education level: Not on file  °Occupational History  °  Employer: RETIRED  °Tobacco Use  ° Smoking status: Never  ° Smokeless tobacco: Never  ° Tobacco comments:  °  NEVER USED TOBACCO  °Vaping Use  ° Vaping Use: Never used  °Substance and Sexual Activity  ° Alcohol use: No  °  Alcohol/week: 0.0 standard drinks  ° Drug use: No  ° Sexual activity: Not Currently  °  Birth control/protection: Surgical  °  Comment: 1st intercourse 76 yo-Fewer than 5 partners  °Other Topics Concern  ° Not on file  °Social History Narrative  ° Married, no children.  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  ° No Tob.  No alc.  No drugs.  ° Orig from [place].  ° °Social Determinants of Health  ° °Financial Resource Strain: Low Risk   ° Difficulty of Paying Living Expenses: Not hard at all  °Food Insecurity: No Food Insecurity  ° Worried About Running Out of Food in the Last Year: Never true  ° Ran Out of Food in the Last Year: Never true  °Transportation Needs: No Transportation Needs  ° Lack of Transportation (Medical): No  ° Lack of Transportation (Non-Medical): No  °Physical Activity: Inactive  ° Days of Exercise per Week: 0 days  ° Minutes of Exercise per Session: 0 min  °Stress: Stress Concern Present  ° Feeling of Stress : To some extent  °Social Connections: Moderately Integrated  ° Frequency of Communication with Friends and Family: More than three times a week  ° Frequency of Social Gatherings with Friends and Family: More  than three times a week  ° Attends Religious Services: More than 4 times per year  ° Active Member of Clubs or Organizations: No  ° Attends Club or Organization Meetings: Never  ° Marital Status: Married  °Intimate Partner Violence: Not At Risk  ° Fear of Current or Ex-Partner: No  ° Emotionally Abused: No  ° Physically Abused: No  ° Sexually Abused: No  ° ° °Current Outpatient Medications on File Prior to Visit  °Medication Sig Dispense   Refill   acetaminophen (TYLENOL) 325 MG tablet Take 650 mg by mouth every 6 (six) hours as needed.     ALPRAZolam (XANAX) 1 MG tablet TAKE 1 TO 2 TABLETS BY MOUTH TWICE DAILY AS NEEDED 120 tablet 5   Azelastine HCl (ASTEPRO) 0.15 % SOLN 2 sprays each nostril every 12 hours 30 mL 11   calcium carbonate (OSCAL) 1500 (600 Ca) MG TABS tablet Take by mouth daily.     Cholecalciferol (VITAMIN D3) 5000 units CAPS Take 4 capsules by mouth daily.      cyclobenzaprine (FLEXERIL) 5 MG tablet Take 5 mg by mouth at bedtime. As needed     diclofenac Sodium (VOLTAREN) 1 % GEL Apply 4 g topically 4 (four) times daily.     doxylamine, Sleep, (UNISOM) 25 MG tablet Take 25 mg by mouth at bedtime as needed.     hydrocortisone 2.5 % cream Apply 1 application topically as needed. RECTAL ITCHING     levothyroxine (SYNTHROID) 75 MCG tablet Take 1 tablet by mouth once daily 90 tablet 0   meclizine (ANTIVERT) 25 MG tablet Take 1 tablet (25 mg total) by mouth 3 (three) times daily as needed for dizziness. 45 tablet 1   meloxicam (MOBIC) 7.5 MG tablet Take 2 tablets by mouth once daily 180 tablet 0   metoprolol tartrate (LOPRESSOR) 25 MG tablet Take 1 tablet (25 mg total) by mouth 2 (two) times daily. 180 tablet 3   Multiple Vitamins-Iron (MULTIVITAMIN/IRON PO) Take 1 tablet by mouth daily.     NIACIN PO Take by mouth daily.     omeprazole (PRILOSEC) 40 MG capsule Take 1 capsule (40 mg total) by mouth daily. 30 capsule 3   ondansetron (ZOFRAN) 8 MG tablet Take 1 tablet (8 mg total) by mouth  every 8 (eight) hours as needed for nausea or vomiting. 30 tablet 3   Oxycodone HCl 10 MG TABS 10 mg every 4 (four) hours as needed.     Oxycodone HCl 20 MG TABS TAKE 1/2 TABLET BY MOUTH EVERY 4 HOURS AS NEEDED FOR PAIN. Qty 90 with 1 refill as of 10/19/21     polyethylene glycol (MIRALAX / GLYCOLAX) 17 g packet Take 17 g by mouth daily.     promethazine (PHENERGAN) 25 MG tablet TAKE ONE TABLET BY MOUTH EVERY 6 HOURS AS NEEDED FOR NAUSEA AND VOMITING 30 tablet 3   simvastatin (ZOCOR) 20 MG tablet Take 1 tablet (20 mg total) by mouth at bedtime. 90 tablet 3   Specialty Vitamins Products (MAGNESIUM, AMINO ACID CHELATE,) 133 MG tablet Take 1 tablet by mouth daily.     tolterodine (DETROL LA) 4 MG 24 hr capsule      traZODone (DESYREL) 50 MG tablet Take 1 tablet by mouth at bedtime.  2   No current facility-administered medications on file prior to visit.    Allergies  Allergen Reactions   Cymbalta [Duloxetine Hcl]     Facial edema   Iohexol      Code: HIVES, Desc: pt states her face and throat swells when administered IV contrast - KB, Onset Date: 69629528    Mepergan [Meperidine-Promethazine]     Cardiac arrest   Morphine Rash   Augmentin [Amoxicillin-Pot Clavulanate] Nausea Only   Citalopram Other (See Comments)    Jittery/heart racing    Hctz [Hydrochlorothiazide] Other (See Comments)    Too much urinating, felt dried out, etc--NO ALLERGY   Lexapro [Escitalopram Oxalate] Other (See Comments)    Jittery,heart racing  Lyrica [Pregabalin] Swelling    Face, hands, fingers, ankles   Promethazine Hcl    Valsartan     REACTION: pt states INTOL to Diovan      BP 140/86    Pulse 61    Ht 5' 6" (1.676 m)    Wt 195 lb 9.6 oz (88.7 kg)    SpO2 93%    BMI 31.57 kg/m    Review of Systems     Objective:   Physical Exam VITAL SIGNS:  See vs page GENERAL: no distress NECK: There is no palpable thyroid enlargement.  No thyroid nodule is palpable.  No palpable lymphadenopathy at the  anterior neck.     Lab Results  Component Value Date   TSH 0.90 11/11/2021   T3TOTAL 137 06/26/2020      Assessment & Plan:  Hypothyroidism: well-controlled.  Please continue the same synthroid

## 2021-11-12 ENCOUNTER — Ambulatory Visit (HOSPITAL_BASED_OUTPATIENT_CLINIC_OR_DEPARTMENT_OTHER)
Admission: RE | Admit: 2021-11-12 | Discharge: 2021-11-12 | Disposition: A | Discharge status: Home / Self Care | Payer: Medicare Other | Source: Ambulatory Visit | Attending: Family Medicine | Admitting: Family Medicine

## 2021-11-12 ENCOUNTER — Encounter (HOSPITAL_BASED_OUTPATIENT_CLINIC_OR_DEPARTMENT_OTHER): Payer: Self-pay

## 2021-11-12 DIAGNOSIS — E039 Hypothyroidism, unspecified: Secondary | ICD-10-CM | POA: Diagnosis not present

## 2021-11-12 DIAGNOSIS — Z1231 Encounter for screening mammogram for malignant neoplasm of breast: Secondary | ICD-10-CM | POA: Insufficient documentation

## 2021-11-12 DIAGNOSIS — Z780 Asymptomatic menopausal state: Secondary | ICD-10-CM | POA: Diagnosis not present

## 2021-11-12 DIAGNOSIS — E2839 Other primary ovarian failure: Secondary | ICD-10-CM | POA: Diagnosis not present

## 2021-11-12 DIAGNOSIS — Z13820 Encounter for screening for osteoporosis: Secondary | ICD-10-CM | POA: Insufficient documentation

## 2021-11-12 DIAGNOSIS — M85832 Other specified disorders of bone density and structure, left forearm: Secondary | ICD-10-CM | POA: Diagnosis not present

## 2021-11-13 ENCOUNTER — Encounter: Payer: Self-pay | Admitting: Family Medicine

## 2021-11-16 ENCOUNTER — Encounter: Payer: Self-pay | Admitting: *Deleted

## 2021-11-16 DIAGNOSIS — M961 Postlaminectomy syndrome, not elsewhere classified: Secondary | ICD-10-CM | POA: Diagnosis not present

## 2021-11-16 DIAGNOSIS — Z79891 Long term (current) use of opiate analgesic: Secondary | ICD-10-CM | POA: Diagnosis not present

## 2021-11-16 DIAGNOSIS — F329 Major depressive disorder, single episode, unspecified: Secondary | ICD-10-CM | POA: Diagnosis not present

## 2021-11-16 DIAGNOSIS — K5900 Constipation, unspecified: Secondary | ICD-10-CM | POA: Diagnosis not present

## 2021-11-16 DIAGNOSIS — G894 Chronic pain syndrome: Secondary | ICD-10-CM | POA: Diagnosis not present

## 2021-11-16 LAB — IMMUNOFIXATION REFLEX, SERUM
IgA: 257 mg/dL (ref 64–422)
IgG (Immunoglobin G), Serum: 775 mg/dL (ref 586–1602)
IgM (Immunoglobulin M), Srm: 623 mg/dL — ABNORMAL HIGH (ref 26–217)

## 2021-11-16 LAB — PROTEIN ELECTROPHORESIS, SERUM, WITH REFLEX
A/G Ratio: 1.1 (ref 0.7–1.7)
Albumin ELP: 3.7 g/dL (ref 2.9–4.4)
Alpha-1-Globulin: 0.2 g/dL (ref 0.0–0.4)
Alpha-2-Globulin: 0.9 g/dL (ref 0.4–1.0)
Beta Globulin: 1.1 g/dL (ref 0.7–1.3)
Gamma Globulin: 1.1 g/dL (ref 0.4–1.8)
Globulin, Total: 3.3 g/dL (ref 2.2–3.9)
M-Spike, %: 0.3 g/dL — ABNORMAL HIGH
SPEP Interpretation: 0
Total Protein ELP: 7 g/dL (ref 6.0–8.5)

## 2021-11-25 ENCOUNTER — Other Ambulatory Visit: Payer: Self-pay

## 2021-11-25 ENCOUNTER — Ambulatory Visit (INDEPENDENT_AMBULATORY_CARE_PROVIDER_SITE_OTHER): Payer: Medicare Other | Admitting: Family Medicine

## 2021-11-25 ENCOUNTER — Encounter: Payer: Self-pay | Admitting: Family Medicine

## 2021-11-25 VITALS — BP 113/73 | HR 58 | Temp 97.8°F | Ht 66.0 in | Wt 195.6 lb

## 2021-11-25 DIAGNOSIS — M533 Sacrococcygeal disorders, not elsewhere classified: Secondary | ICD-10-CM | POA: Diagnosis not present

## 2021-11-25 DIAGNOSIS — G8929 Other chronic pain: Secondary | ICD-10-CM | POA: Diagnosis not present

## 2021-11-25 DIAGNOSIS — M545 Low back pain, unspecified: Secondary | ICD-10-CM | POA: Diagnosis not present

## 2021-11-25 DIAGNOSIS — F411 Generalized anxiety disorder: Secondary | ICD-10-CM

## 2021-11-25 DIAGNOSIS — G894 Chronic pain syndrome: Secondary | ICD-10-CM

## 2021-11-25 DIAGNOSIS — F4323 Adjustment disorder with mixed anxiety and depressed mood: Secondary | ICD-10-CM

## 2021-11-25 MED ORDER — OMEPRAZOLE 40 MG PO CPDR: 40.0000 mg | DELAYED_RELEASE_CAPSULE | Freq: Every day | ORAL | 3 refills | Status: DC

## 2021-11-25 NOTE — Progress Notes (Signed)
OFFICE VISIT  11/25/2021  CC:  Chief Complaint  Patient presents with   Anxiety    Patient is a 76 y.o. female who presents for 1 mo f/u anxiety. A/P as of last visit: "#1 nausea, chronic.  Mostly anxiety-related but GERD contributing.  Reasonably controlled.  Patient continues to experience these symptoms; however, it is controlled with Omeprazole 40 mg and Phenergan 25 mg PRN. No changes to medication at this time.    #2 chronic constipation, at least partially opioid induced. Still continues to improve. Continue as needed MiraLAX and stool softener.   #3, chronic anxiety.  Superimposed adjustment disorder with mixed anxious and depressed mood. Stable. Patient is at her personal baseline for her mood. She is able to carry out everyday activities, eat fine. Sleep persistently interrupted d/t husband's frequent needs at all hours. Continue to take Alprazolam BID and 1/2 $Remov'50mg'aZCKjL$  trazdone hx. She has had intolerance to multiple SSRIs and SNRIs in the past. Lipids today.   #4 Preventative health care: Breast cancer screening--due for mammogram--ordered. Osteoporosis screening--due for DEXA-ordered.  Hx vit D def-->she is taking 5000 U vit D daily-->check vit D level today. Vaccines: Shingrix->deferred for now.  Otherwise UTD. Colon cancer screening-recall 2023-2025 timeframe.  INTERIM HX: [name] is struggling with her anxiety at about her baseline. Feels tired and in pain all the time.  We discussed some of the things that tend to worsen her worry. Most of her pain is located in the low back and her left knee.  She points to the SI joints bilaterally.  Says it radiates into the gluteal areas bilaterally. Is been determined by MRI imaging last month that she has moderate to severe tricompartmental DJD plus lateral meniscus tear of left knee. A knee joint aspiration and injection with steroids did not result in any improvement earlier this year.  She is followed by pain management  specialist, Dr. Greta Doom. She is on oxycodone and meloxicam. Gabapentin and Lyrica in the past have caused adverse side effects. Per patient report she has received L-spine epidural steroid injections in the remote past by her pain management specialist.  Past Medical History:  Diagnosis Date   Allergic rhinitis    Anxiety    Chest pain    cr   Chronic gastritis    dx'd by EGD 2018 (erosive, likely NSAID-induced). 06/2019 +gastritis.   Chronic pain syndrome    right knee osteo, hx of TKA in R knee.  Pt was told by [name] that no further surgical options are available for her; also, chronic lower back pain.--[name].   Chronic renal insufficiency, stage II (mild) 2015   vs acute fluctuations depending on hydration?---GFR from 60s up to 80s.   Diverticulosis of colon    with hx of 'itis   DJD (degenerative joint disease)    L spine and knees.  R knee inject 03/15/18, L knee inject 12/26/18 (by Dr. Lynann Bologna).   Epigastric pain    As of 01/2017, GI (Dr. Loletha Carrow) planned EGD.   FATIGUE / MALAISE 07/01/2009   Chronic   GERD (gastroesophageal reflux disease)    History of colonic polyps    HTN (hypertension)    Hypercholesterolemia    recommended restart of simvastatin 07/2018   Hypothyroid 07/05/2012   IBS (irritable bowel syndrome)    IC (interstitial cystitis)    [name] at [facility]   Iron deficiency anemia, unspecified 06/27/2013   Has required IV iron on multiple occasions ([name]).  Hemoccults neg  12/2016. Most recent hem f/u labs normal 09/2020.   Lumbar back pain    Spinal stenosis, lumbar region with neurogenic claudication L5-S1 sx's--ESI's no help.  CT L spine stable post-surg appearance as of 12/2019   Major depression, recurrent ([place])    vs dysthymia,chronic   Memory impairment 10/03/2012   Memory impairment 10/03/2012 >referral to neuro     Monoclonal gammopathy of undetermined significance    IgM kappa MGUS ([name]) routine f/u has shown  stability of this problem (most recent f/u 09/2020)   Neuromuscular disorder ([place])    OSA (obstructive sleep apnea)    Latest sleep study 01/2017.  Some adjustments to CPAP being made+ dental referral for consideration of oral appliance --per pulm MD.   Osteopenia 04/2014; 03/2018   2015: T score -2.0 FRAX 11%/1.8%.  2019 FRAX 10%/1.5%.  10/2021 T score -1.6 radius, -0.9 femur   Otitis externa, candida 91/6384   [name] tx'd her with clotrimazole 1% external solution x 1 mo   Palpitations    Psoriasis    Trochanteric bursitis of both hips    + injections by ortho in the past   Vertigo 2020/21   6+ wks, ->to ENT->?vestib neuronitis with ongoing motion sensitivity, gradually resolving.([name] 6/65/99). MRI brain normal 09/2020.   Vitamin D deficiency    Xerostomia    and xerophthalmia--per [name] (he suggests SSA, SSB, ESR, rh factor, ANA, and antimicrosmal and antithyroglobulin ab's as of 02/17/16--all these labs were NORMAL.    Past Surgical History:  Procedure Laterality Date   ABDOMINAL HYSTERECTOMY     for prolapse with abdominal incision   BACK SURGERY  01/23/2015   Lumbar decompression with L2-L5 fusion ([name])   [place]     no PCI   CHOLECYSTECTOMY     COLONOSCOPY  04/2006;07/16/19   Diverticulosis, o/w normal.  06/2019->2 adenomas, +Diverticulosis.  Recall 2023-2025.   DEXA  04/06/2018   T score -1.5 femur neck, -2.2 radius.  FRAX 10%/1.5%.  10/2021 T score -1.6 radius, -0.9 femur. rpt 2 yrs   ESOPHAGOGASTRODUODENOSCOPY  05/29/2009; 03/01/17;07/16/19   Esoph normal.  Gastritis (H pylori NEG) + gastric polyps (benign).  Duodenal biopsy NORMAL.  2018-mild chronic gastritis, H pylori NEG. 06/2019 tortuous esophagus w/out stenosis +chronic gastritis H pyl neg.   EYE SURGERY Left    "lazy eye"   HAND SURGERY Right    TRIGGER FINGER; index finger   HERNIA REPAIR     umbilical   KNEE ARTHROSCOPY     RIGHT KNEE X 5   LAMINOTOMY  2010   L-4, L-5 FUSION    NASAL SINUS SURGERY     polypectomy (remote past)   OOPHORECTOMY  2010   Laparoscopic BSO (path benign)   PATELLA FRACTURE SURGERY  06/2007   [name]   POSTERIOR LAMINECTOMY / DECOMPRESSION LUMBAR SPINE  05/2008   [name]   TOTAL KNEE ARTHROPLASTY  12-07 and 4-07   [name], revision 12-09 [name] at [facility]    Outpatient Medications Prior to Visit  Medication Sig Dispense Refill   acetaminophen (TYLENOL) 325 MG tablet Take 650 mg by mouth every 6 (six) hours as needed.     ALPRAZolam (XANAX) 1 MG tablet TAKE 1 TO 2 TABLETS BY MOUTH TWICE DAILY AS NEEDED 120 tablet 5   Azelastine HCl (ASTEPRO) 0.15 % SOLN 2 sprays each nostril every 12 hours 30 mL 11   calcium carbonate (OSCAL) 1500 (600 Ca) MG TABS tablet Take by  mouth daily.     Cholecalciferol (VITAMIN D3) 5000 units CAPS Take 4 capsules by mouth daily.      cyclobenzaprine (FLEXERIL) 5 MG tablet Take 5 mg by mouth at bedtime. As needed     diclofenac Sodium (VOLTAREN) 1 % GEL Apply 4 g topically 4 (four) times daily.     doxylamine, Sleep, (UNISOM) 25 MG tablet Take 25 mg by mouth at bedtime as needed.     hydrocortisone 2.5 % cream Apply 1 application topically as needed. RECTAL ITCHING     levothyroxine (SYNTHROID) 75 MCG tablet Take 1 tablet by mouth once daily 90 tablet 0   meclizine (ANTIVERT) 25 MG tablet Take 1 tablet (25 mg total) by mouth 3 (three) times daily as needed for dizziness. 45 tablet 1   meloxicam (MOBIC) 7.5 MG tablet Take 2 tablets by mouth once daily 180 tablet 0   metoprolol tartrate (LOPRESSOR) 25 MG tablet Take 1 tablet (25 mg total) by mouth 2 (two) times daily. 180 tablet 3   Multiple Vitamins-Iron (MULTIVITAMIN/IRON PO) Take 1 tablet by mouth daily.     NIACIN PO Take by mouth daily.     ondansetron (ZOFRAN) 8 MG tablet Take 1 tablet (8 mg total) by mouth every 8 (eight) hours as needed for nausea or vomiting. 30 tablet 3   Oxycodone HCl 10 MG TABS 10 mg every 4 (four) hours as needed.      polyethylene glycol (MIRALAX / GLYCOLAX) 17 g packet Take 17 g by mouth daily.     promethazine (PHENERGAN) 25 MG tablet TAKE ONE TABLET BY MOUTH EVERY 6 HOURS AS NEEDED FOR NAUSEA AND VOMITING 30 tablet 3   simvastatin (ZOCOR) 20 MG tablet Take 1 tablet (20 mg total) by mouth at bedtime. (Patient taking differently: Take 40 mg by mouth at bedtime.) 90 tablet 3   Specialty Vitamins Products (MAGNESIUM, AMINO ACID CHELATE,) 133 MG tablet Take 1 tablet by mouth daily.     tolterodine (DETROL LA) 4 MG 24 hr capsule      traZODone (DESYREL) 50 MG tablet Take 1 tablet by mouth at bedtime.  2   omeprazole (PRILOSEC) 40 MG capsule Take 1 capsule (40 mg total) by mouth daily. 30 capsule 3   Oxycodone HCl 20 MG TABS TAKE 1/2 TABLET BY MOUTH EVERY 4 HOURS AS NEEDED FOR PAIN. Qty 46 with 1 refill as of 10/19/21 (Patient not taking: Reported on 11/25/2021)     No facility-administered medications prior to visit.    Allergies  Allergen Reactions   Cymbalta [Duloxetine Hcl]     Facial edema   Iohexol      Code: HIVES, Desc: pt states her face and throat swells when administered IV contrast - KB, Onset Date: [id number]    Mepergan [Meperidine-Promethazine]     Cardiac arrest   Morphine Rash   Augmentin [Amoxicillin-Pot Clavulanate] Nausea Only   Citalopram Other (See Comments)    Jittery/heart racing    Hctz [Hydrochlorothiazide] Other (See Comments)    Too much urinating, felt dried out, etc--NO ALLERGY   Lexapro [Escitalopram Oxalate] Other (See Comments)    Jittery,heart racing   Lyrica [Pregabalin] Swelling    Face, hands, fingers, ankles   Promethazine Hcl    Valsartan     REACTION: pt states INTOL to Diovan    ROS As per HPI  PE: Vitals with BMI 11/25/2021 11/11/2021 11/10/2021  Height $Remov'5\' 6"'LThAWu$  $Remove'5\' 6"'VwKlstI$  -  Weight 195 lbs 10 oz 195 lbs 10 oz  195 lbs 2 oz  BMI 31.59 61.60 73.71  Systolic 062 694 854  Diastolic 73 86 49  Pulse 58 61 63   Physical Exam Exam chaperoned by Deveron Furlong,  CMA.  General: Alert and well-appearing. Affect: She is pleasant, lucid thought and speech. Low back: Decreased range of motion in all motions.  Significant tenderness to palpation over facet regions of L4-L5 level as well as bilateral SI joint regions. Basically any maneuvering of the pelvis and femurs elicit the pain in her low back/sacral region.  Legs strength 5 out of 5 proximally and distally.  Supine straight leg raise equivocal bilateral Knees not examined today.  LABS:  Last CBC Lab Results  Component Value Date   WBC 7.7 11/10/2021   HGB 14.6 11/10/2021   HCT 43.8 11/10/2021   MCV 86.4 11/10/2021   MCH 28.8 11/10/2021   RDW 12.8 11/10/2021   PLT 219 62/70/3500   Last metabolic panel Lab Results  Component Value Date   GLUCOSE 120 (H) 11/10/2021   NA 139 11/10/2021   K 4.0 11/10/2021   CL 103 11/10/2021   CO2 28 11/10/2021   BUN 17 11/10/2021   CREATININE 1.00 11/10/2021   GFRNONAA 59 (L) 11/10/2021   CALCIUM 8.9 11/10/2021   PROT 7.2 11/10/2021   ALBUMIN 4.0 11/10/2021   LABGLOB 3.3 11/10/2021   AGRATIO 1.1 11/10/2021   BILITOT 0.6 11/10/2021   ALKPHOS 97 11/10/2021   AST 17 11/10/2021   ALT 17 11/10/2021   ANIONGAP 8 11/10/2021   Last lipids Lab Results  Component Value Date   CHOL 289 (H) 10/22/2021   HDL 32.60 (L) 10/22/2021   LDLCALC 123 (H) 04/21/2021   LDLDIRECT 207.0 10/22/2021   TRIG 310.0 (H) 10/22/2021   CHOLHDL 9 10/22/2021    Last thyroid functions Lab Results  Component Value Date   TSH 0.90 11/11/2021   T3TOTAL 137 06/26/2020   IMPRESSION AND PLAN:  1) Chronic generalized anxiety, with superimposed adjustment disorder with anxious and depressed mood. Discussed some coping mechanisms today, continue alprazolam.  Has had poor response/negative experiences on antidepressants in the past and does not want to retry any. She will continue half of the 50 mg trazodone at night for sleep.  #2 chronic pain syndrome.  She has many features  of fibromyalgia. Additionally she has osteoarthritis affecting mainly her knees and low back. She is considering seeing Dr. Noemi Chapel in orthopedics for further discussion of possible left TKA. Opioid management as per pain management specialist. She may benefit from SI joint steroid injection or ESI but will certainly leave this to her pain specialist to consider.  An After Visit Summary was printed and given to the patient.  Spent 25 min with pt today reviewing HPI, reviewing relevant past history, doing exam, reviewing and discussing lab and imaging data, and formulating plans.  FOLLOW UP: Return for 4-6 wks f/u anxiety.  Signed:  Crissie Sickles, MD           11/25/2021

## 2021-12-08 ENCOUNTER — Encounter: Payer: Self-pay | Admitting: Family Medicine

## 2021-12-08 ENCOUNTER — Other Ambulatory Visit: Payer: Self-pay

## 2021-12-08 ENCOUNTER — Ambulatory Visit (INDEPENDENT_AMBULATORY_CARE_PROVIDER_SITE_OTHER): Payer: Medicare Other | Admitting: Family Medicine

## 2021-12-08 VITALS — BP 124/81 | HR 59 | Temp 97.8°F | Ht 66.0 in | Wt 197.4 lb

## 2021-12-08 DIAGNOSIS — R42 Dizziness and giddiness: Secondary | ICD-10-CM | POA: Diagnosis not present

## 2021-12-08 NOTE — Progress Notes (Signed)
OFFICE VISIT ? ?12/08/2021 ? ?CC:  ?Chief Complaint  ?Patient presents with  ? Fatigue  ? Dizziness  ?  When laying down, pt becomes dizzy. Pt has current rx for Meclizine $RemoveBefor'25mg'[id number]$  and completed PT at [facility].   ? ? ?Patient is a 76 y.o. female who presents for vertigo. ? ?HPI: ?Onset about 4 to 5 days ago, vague feeling of motion that she mainly feels when she lies supine. ?Gets nausea with this, seems to last about 20 to 30 seconds at the most.  Feels some disequilibrium some between episodes.  It is improving with home Epley maneuvers. ?She recalls benefiting very much from vestibular rehab at [facility] and requests referral today. ?Taking meclizine, helps minimally. ?No headache, no recent URI symptoms, no fevers, no double vision.  No ringing in the ears or decreased hearing. ? ? ?Pt's psychotropic meds: Xanax, Flexeril, trazodone, and oxycodone ? ?Past Medical History:  ?Diagnosis Date  ? Allergic rhinitis   ? Anxiety   ? Chest pain   ? cr  ? Chronic gastritis   ? dx'd by EGD 2018 (erosive, likely NSAID-induced). 06/2019 +gastritis.  ? Chronic pain syndrome   ? right knee osteo, hx of TKA in R knee.  Pt was told by [name] that no further surgical options are available for her; also, chronic lower back pain.--[name].  ? Chronic renal insufficiency, stage II (mild) 2015  ? vs acute fluctuations depending on hydration?---GFR from 60s up to 80s.  ? Diverticulosis of colon   ? with hx of 'itis  ? DJD (degenerative joint disease)   ? L spine and knees.  R knee inject 03/15/18, L knee inject 12/26/18 (by Dr. Lynann Bologna).  ? Epigastric pain   ? As of 01/2017, GI (Dr. Loletha Carrow) planned EGD.  ? FATIGUE / MALAISE 07/01/2009  ? Chronic  ? GERD (gastroesophageal reflux disease)   ? History of colonic polyps   ? HTN (hypertension)   ? Hypercholesterolemia   ? recommended restart of simvastatin 07/2018  ? Hypothyroid 07/05/2012  ? IBS (irritable bowel syndrome)   ? IC (interstitial cystitis)   ? [name]  at [facility]  ? Iron deficiency anemia, unspecified 06/27/2013  ? Has required IV iron on multiple occasions ([name]).  Hemoccults neg 12/2016. Most recent hem f/u labs normal 09/2020.  ? Lumbar back pain   ? Spinal stenosis, lumbar region with neurogenic claudication L5-S1 sx's--ESI's no help.  CT L spine stable post-surg appearance as of 12/2019  ? Major depression, recurrent ([place])   ? vs dysthymia,chronic  ? Memory impairment 10/03/2012  ? Memory impairment 10/03/2012 >referral to neuro    ? Monoclonal gammopathy of undetermined significance   ? IgM kappa MGUS ([name]) routine f/u has shown stability of this problem (most recent f/u 09/2020)  ? Neuromuscular disorder ([place])   ? OSA (obstructive sleep apnea)   ? Latest sleep study 01/2017.  Some adjustments to CPAP being made+ dental referral for consideration of oral appliance --per pulm MD.  ? Osteopenia 04/2014; 03/2018  ? 2015: T score -2.0 FRAX 11%/1.8%.  2019 FRAX 10%/1.5%.  10/2021 T score -1.6 radius, -0.9 femur  ? Otitis externa, candida 07/2015  ? [name] tx'd her with clotrimazole 1% external solution x 1 mo  ? Palpitations   ? Psoriasis   ? Trochanteric bursitis of both hips   ? + injections by ortho in the past  ? Vertigo 2020/21  ? 6+ wks, ->  to ENT->?vestib neuronitis with ongoing motion sensitivity, gradually resolving.([name] 3/50/09). MRI brain normal 09/2020.  ? Vitamin D deficiency   ? Xerostomia   ? and xerophthalmia--per [name] (he suggests SSA, SSB, ESR, rh factor, ANA, and antimicrosmal and antithyroglobulin ab's as of 02/17/16--all these labs were NORMAL.  ? ? ?Past Surgical History:  ?Procedure Laterality Date  ? ABDOMINAL HYSTERECTOMY    ? for prolapse with abdominal incision  ? BACK SURGERY  01/23/2015  ? Lumbar decompression with L2-L5 fusion ([name])  ? CARDIAC CATHETERIZATION    ? no PCI  ? CHOLECYSTECTOMY    ? COLONOSCOPY  04/2006;07/16/19  ? Diverticulosis, o/w normal.  06/2019->2 adenomas, +Diverticulosis.  Recall  2023-2025.  ? DEXA  04/06/2018  ? T score -1.5 femur neck, -2.2 radius.  FRAX 10%/1.5%.  10/2021 T score -1.6 radius, -0.9 femur. rpt 2 yrs  ? ESOPHAGOGASTRODUODENOSCOPY  05/29/2009; 03/01/17;07/16/19  ? Esoph normal.  Gastritis (H pylori NEG) + gastric polyps (benign).  Duodenal biopsy NORMAL.  2018-mild chronic gastritis, H pylori NEG. 06/2019 tortuous esophagus w/out stenosis +chronic gastritis H pyl neg.  ? EYE SURGERY Left   ? "lazy eye"  ? HAND SURGERY Right   ? TRIGGER FINGER; index finger  ? HERNIA REPAIR    ? umbilical  ? KNEE ARTHROSCOPY    ? RIGHT KNEE X 5  ? LAMINOTOMY  2010  ? L-4, L-5 FUSION  ? NASAL SINUS SURGERY    ? polypectomy (remote past)  ? OOPHORECTOMY  2010  ? Laparoscopic BSO (path benign)  ? PATELLA FRACTURE SURGERY  06/2007  ? [name]  ? POSTERIOR LAMINECTOMY / DECOMPRESSION LUMBAR SPINE  05/2008  ? [name]  ? TOTAL KNEE ARTHROPLASTY  12-07 and 4-07  ? [name], revision 12-09 [name] at [facility]  ? ? ?Outpatient Medications Prior to Visit  ?Medication Sig Dispense Refill  ? acetaminophen (TYLENOL) 325 MG tablet Take 650 mg by mouth every 6 (six) hours as needed.    ? ALPRAZolam (XANAX) 1 MG tablet TAKE 1 TO 2 TABLETS BY MOUTH TWICE DAILY AS NEEDED 120 tablet 5  ? Azelastine HCl (ASTEPRO) 0.15 % SOLN 2 sprays each nostril every 12 hours 30 mL 11  ? calcium carbonate (OSCAL) 1500 (600 Ca) MG TABS tablet Take by mouth daily.    ? Cholecalciferol (VITAMIN D3) 5000 units CAPS Take 4 capsules by mouth daily.     ? cyclobenzaprine (FLEXERIL) 5 MG tablet Take 5 mg by mouth at bedtime. As needed    ? diclofenac Sodium (VOLTAREN) 1 % GEL Apply 4 g topically 4 (four) times daily.    ? doxylamine, Sleep, (UNISOM) 25 MG tablet Take 25 mg by mouth at bedtime as needed.    ? hydrocortisone 2.5 % cream Apply 1 application topically as needed. RECTAL ITCHING    ? levothyroxine (SYNTHROID) 75 MCG tablet Take 1 tablet by mouth once daily 90 tablet 0  ? meclizine (ANTIVERT) 25 MG tablet Take 1 tablet (25 mg  total) by mouth 3 (three) times daily as needed for dizziness. 45 tablet 1  ? meloxicam (MOBIC) 7.5 MG tablet Take 2 tablets by mouth once daily 180 tablet 0  ? metoprolol tartrate (LOPRESSOR) 25 MG tablet Take 1 tablet (25 mg total) by mouth 2 (two) times daily. 180 tablet 3  ? Multiple Vitamins-Iron (MULTIVITAMIN/IRON PO) Take 1 tablet by mouth daily.    ? NIACIN PO Take by mouth daily.    ? omeprazole (PRILOSEC) 40  MG capsule Take 1 capsule (40 mg total) by mouth daily. 90 capsule 3  ? ondansetron (ZOFRAN) 8 MG tablet Take 1 tablet (8 mg total) by mouth every 8 (eight) hours as needed for nausea or vomiting. 30 tablet 3  ? Oxycodone HCl 10 MG TABS 10 mg every 4 (four) hours as needed.    ? polyethylene glycol (MIRALAX / GLYCOLAX) 17 g packet Take 17 g by mouth daily.    ? promethazine (PHENERGAN) 25 MG tablet TAKE ONE TABLET BY MOUTH EVERY 6 HOURS AS NEEDED FOR NAUSEA AND VOMITING 30 tablet 3  ? simvastatin (ZOCOR) 20 MG tablet Take 1 tablet (20 mg total) by mouth at bedtime. (Patient taking differently: Take 40 mg by mouth at bedtime.) 90 tablet 3  ? Specialty Vitamins Products (MAGNESIUM, AMINO ACID CHELATE,) 133 MG tablet Take 1 tablet by mouth daily.    ? tolterodine (DETROL LA) 4 MG 24 hr capsule     ? traZODone (DESYREL) 50 MG tablet Take 1 tablet by mouth at bedtime.  2  ? Oxycodone HCl 20 MG TABS TAKE 1/2 TABLET BY MOUTH EVERY 4 HOURS AS NEEDED FOR PAIN. Qty 90 with 1 refill as of 10/19/21 (Patient not taking: Reported on 11/25/2021)    ? ?No facility-administered medications prior to visit.  ? ? ?Allergies  ?Allergen Reactions  ? Cymbalta [Duloxetine Hcl]   ?  Facial edema  ? Iohexol   ?   Code: HIVES, Desc: pt states her face and throat swells when administered IV contrast - KB, Onset Date: [id number] ?  ? Mepergan [Meperidine-Promethazine]   ?  Cardiac arrest  ? Morphine Rash  ? Augmentin [Amoxicillin-Pot Clavulanate] Nausea Only  ? Citalopram Other (See Comments)  ?  Jittery/heart racing ?  ? Hctz  [Hydrochlorothiazide] Other (See Comments)  ?  Too much urinating, felt dried out, etc--NO ALLERGY  ? Lexapro [Escitalopram Oxalate] Other (See Comments)  ?  Jittery,heart racing  ? Lyrica [Pregabalin] Swelling  ?  F

## 2021-12-11 ENCOUNTER — Other Ambulatory Visit: Payer: Self-pay | Admitting: Endocrinology

## 2021-12-30 ENCOUNTER — Ambulatory Visit (INDEPENDENT_AMBULATORY_CARE_PROVIDER_SITE_OTHER): Payer: Medicare Other | Admitting: Family Medicine

## 2021-12-30 ENCOUNTER — Encounter: Payer: Self-pay | Admitting: Family Medicine

## 2021-12-30 VITALS — BP 112/74 | HR 59 | Temp 97.9°F | Ht 66.0 in | Wt 195.6 lb

## 2021-12-30 DIAGNOSIS — F419 Anxiety disorder, unspecified: Secondary | ICD-10-CM

## 2021-12-30 DIAGNOSIS — M47816 Spondylosis without myelopathy or radiculopathy, lumbar region: Secondary | ICD-10-CM | POA: Diagnosis not present

## 2021-12-30 DIAGNOSIS — M545 Low back pain, unspecified: Secondary | ICD-10-CM | POA: Diagnosis not present

## 2021-12-30 DIAGNOSIS — M5136 Other intervertebral disc degeneration, lumbar region: Secondary | ICD-10-CM

## 2021-12-30 DIAGNOSIS — M67959 Unspecified disorder of synovium and tendon, unspecified thigh: Secondary | ICD-10-CM

## 2021-12-30 DIAGNOSIS — G8929 Other chronic pain: Secondary | ICD-10-CM

## 2021-12-30 DIAGNOSIS — Z981 Arthrodesis status: Secondary | ICD-10-CM | POA: Diagnosis not present

## 2021-12-30 NOTE — Progress Notes (Signed)
OFFICE VISIT ? ?12/30/2021 ? ?CC:  ?Chief Complaint  ?Patient presents with  ? Anxiety  ? ? ?Patient is a 76 y.o. female who presents accompanied by her husband for 1 month follow-up acute on chronic generalized anxiety as well as acute on chronic low back pain. ?A/P as of last visit: ?"1) Chronic generalized anxiety, with superimposed adjustment disorder with anxious and depressed mood. ?Discussed some coping mechanisms today, continue alprazolam.  Has had poor response/negative experiences on antidepressants in the past and does not want to retry any. ?She will continue half of the 50 mg trazodone at night for sleep. ?  ?#2 chronic pain syndrome.  She has many features of fibromyalgia. ?Additionally she has osteoarthritis affecting mainly her knees and low back. ?She is considering seeing Dr. Noemi Chapel in orthopedics for further discussion of possible left TKA. ?Opioid management as per pain management specialist. ?She may benefit from SI joint steroid injection or ESI but will certainly leave this to her pain specialist to consider." ? ?INTERIM HX: ?She is in a lot of pain lately. ?She did excessive house and yard work recently and after this has had severe worsening of her diffuse low back pain. ?She requests PT. ?She continues on opioid pain management with Dr. Lesly Rubenstein day, no changes recently. ?She has a long history of low back pain due to lumbar spondylosis/degenerative disc disease.  History of 2 lumbar decompression surgeries and then a fusion surgery. ?Followed by [name] in neurosurgery, most recent follow-up patient was told once again nothing surgical can be done at this point. ?She has had multiple steroid injections of various types/locations in her L-spine over the years. ?Does not sound like these have resulted in much lasting improvement.  She has not had physical therapy in quite some time. ? ?The pain does not radiate down her legs but does extend into the both gluteal areas and she has chronic pain in  the lateral hip region bilaterally. ? ?PMP AWARE reviewed today: most recent rx for alprazolam was filled 12/05/21, # 120, rx by me. ?Her pain meds are rx'd by her pain specialist, Dr. Greta Doom. ?No red flags. ? ?Past Medical History:  ?Diagnosis Date  ? Allergic rhinitis   ? Anxiety   ? Chest pain   ? cr  ? Chronic gastritis   ? dx'd by EGD 2018 (erosive, likely NSAID-induced). 06/2019 +gastritis.  ? Chronic pain syndrome   ? right knee osteo, hx of TKA in R knee.  Pt was told by [name] that no further surgical options are available for her; also, chronic lower back pain.--[name].  ? Chronic renal insufficiency, stage II (mild) 2015  ? vs acute fluctuations depending on hydration?---GFR from 60s up to 80s.  ? Diverticulosis of colon   ? with hx of 'itis  ? DJD (degenerative joint disease)   ? L spine and knees.  R knee inject 03/15/18, L knee inject 12/26/18 (by Dr. Lynann Bologna).  ? Epigastric pain   ? As of 01/2017, GI (Dr. Loletha Carrow) planned EGD.  ? FATIGUE / MALAISE 07/01/2009  ? Chronic  ? GERD (gastroesophageal reflux disease)   ? History of colonic polyps   ? HTN (hypertension)   ? Hypercholesterolemia   ? recommended restart of simvastatin 07/2018  ? Hypothyroid 07/05/2012  ? IBS (irritable bowel syndrome)   ? IC (interstitial cystitis)   ? [name] at [facility]  ? Iron deficiency anemia, unspecified 06/27/2013  ? Has required IV iron on multiple occasions (  [name]).  Hemoccults neg 12/2016. Most recent hem f/u labs normal 09/2020.  ? Lumbar back pain   ? Spinal stenosis, lumbar region with neurogenic claudication L5-S1 sx's--ESI's no help.  CT L spine stable post-surg appearance as of 12/2019  ? Major depression, recurrent ([place])   ? vs dysthymia,chronic  ? Memory impairment 10/03/2012  ? Memory impairment 10/03/2012 >referral to neuro    ? Monoclonal gammopathy of undetermined significance   ? IgM kappa MGUS ([name]) routine f/u has shown stability of this problem (most recent f/u 09/2020)  ?  Neuromuscular disorder ([place])   ? OSA (obstructive sleep apnea)   ? Latest sleep study 01/2017.  Some adjustments to CPAP being made+ dental referral for consideration of oral appliance --per pulm MD.  ? Osteopenia 04/2014; 03/2018  ? 2015: T score -2.0 FRAX 11%/1.8%.  2019 FRAX 10%/1.5%.  10/2021 T score -1.6 radius, -0.9 femur  ? Otitis externa, candida 07/2015  ? [name] tx'd her with clotrimazole 1% external solution x 1 mo  ? Palpitations   ? Psoriasis   ? Trochanteric bursitis of both hips   ? + injections by ortho in the past  ? Vertigo 2020/21  ? 6+ wks, ->to ENT->?vestib neuronitis with ongoing motion sensitivity, gradually resolving.([name] 1/44/81). MRI brain normal 09/2020.  ? Vitamin D deficiency   ? Xerostomia   ? and xerophthalmia--per [name] (he suggests SSA, SSB, ESR, rh factor, ANA, and antimicrosmal and antithyroglobulin ab's as of 02/17/16--all these labs were NORMAL.  ? ? ?Past Surgical History:  ?Procedure Laterality Date  ? ABDOMINAL HYSTERECTOMY    ? for prolapse with abdominal incision  ? BACK SURGERY  01/23/2015  ? Lumbar decompression with L2-L5 fusion ([name])  ? CARDIAC CATHETERIZATION    ? no PCI  ? CHOLECYSTECTOMY    ? COLONOSCOPY  04/2006;07/16/19  ? Diverticulosis, o/w normal.  06/2019->2 adenomas, +Diverticulosis.  Recall 2023-2025.  ? DEXA  04/06/2018  ? T score -1.5 femur neck, -2.2 radius.  FRAX 10%/1.5%.  10/2021 T score -1.6 radius, -0.9 femur. rpt 2 yrs  ? ESOPHAGOGASTRODUODENOSCOPY  05/29/2009; 03/01/17;07/16/19  ? Esoph normal.  Gastritis (H pylori NEG) + gastric polyps (benign).  Duodenal biopsy NORMAL.  2018-mild chronic gastritis, H pylori NEG. 06/2019 tortuous esophagus w/out stenosis +chronic gastritis H pyl neg.  ? EYE SURGERY Left   ? "lazy eye"  ? HAND SURGERY Right   ? TRIGGER FINGER; index finger  ? HERNIA REPAIR    ? umbilical  ? KNEE ARTHROSCOPY    ? RIGHT KNEE X 5  ? LAMINOTOMY  2010  ? L-4, L-5 FUSION  ? NASAL SINUS SURGERY    ? polypectomy (remote past)   ? OOPHORECTOMY  2010  ? Laparoscopic BSO (path benign)  ? PATELLA FRACTURE SURGERY  06/2007  ? [name]  ? POSTERIOR LAMINECTOMY / DECOMPRESSION LUMBAR SPINE  05/2008  ? [name]  ? TOTAL KNEE ARTHROPLASTY  12-07 and 4-07  ? [name], revision 12-09 [name] at [facility]  ? ? ?Outpatient Medications Prior to Visit  ?Medication Sig Dispense Refill  ? acetaminophen (TYLENOL) 325 MG tablet Take 650 mg by mouth every 6 (six) hours as needed.    ? ALPRAZolam (XANAX) 1 MG tablet TAKE 1 TO 2 TABLETS BY MOUTH TWICE DAILY AS NEEDED 120 tablet 5  ? Azelastine HCl (ASTEPRO) 0.15 % SOLN 2 sprays each nostril every 12 hours 30 mL 11  ? calcium carbonate (OSCAL) 1500 (600 Ca)  MG TABS tablet Take by mouth daily.    ? Cholecalciferol (VITAMIN D3) 5000 units CAPS Take 4 capsules by mouth daily.     ? cyclobenzaprine (FLEXERIL) 5 MG tablet Take 5 mg by mouth at bedtime. As needed    ? diclofenac Sodium (VOLTAREN) 1 % GEL Apply 4 g topically 4 (four) times daily.    ? doxylamine, Sleep, (UNISOM) 25 MG tablet Take 25 mg by mouth at bedtime as needed.    ? hydrocortisone 2.5 % cream Apply 1 application topically as needed. RECTAL ITCHING    ? levothyroxine (SYNTHROID) 75 MCG tablet Take 1 tablet by mouth once daily 90 tablet 0  ? meclizine (ANTIVERT) 25 MG tablet Take 1 tablet (25 mg total) by mouth 3 (three) times daily as needed for dizziness. 45 tablet 1  ? meloxicam (MOBIC) 7.5 MG tablet Take 2 tablets by mouth once daily 180 tablet 0  ? metoprolol tartrate (LOPRESSOR) 25 MG tablet Take 1 tablet (25 mg total) by mouth 2 (two) times daily. 180 tablet 3  ? Multiple Vitamins-Iron (MULTIVITAMIN/IRON PO) Take 1 tablet by mouth daily.    ? NIACIN PO Take by mouth daily.    ? omeprazole (PRILOSEC) 40 MG capsule Take 1 capsule (40 mg total) by mouth daily. 90 capsule 3  ? Oxycodone HCl 10 MG TABS 10 mg every 4 (four) hours as needed.    ? polyethylene glycol (MIRALAX / GLYCOLAX) 17 g packet Take 17 g by mouth daily.    ? promethazine  (PHENERGAN) 25 MG tablet TAKE ONE TABLET BY MOUTH EVERY 6 HOURS AS NEEDED FOR NAUSEA AND VOMITING 30 tablet 3  ? simvastatin (ZOCOR) 20 MG tablet Take 1 tablet (20 mg total) by mouth at bedtime. (Patient taki

## 2021-12-31 ENCOUNTER — Other Ambulatory Visit: Payer: Self-pay | Admitting: Family Medicine

## 2022-01-12 DIAGNOSIS — M5451 Vertebrogenic low back pain: Secondary | ICD-10-CM | POA: Diagnosis not present

## 2022-01-12 DIAGNOSIS — M4716 Other spondylosis with myelopathy, lumbar region: Secondary | ICD-10-CM | POA: Diagnosis not present

## 2022-01-14 DIAGNOSIS — F329 Major depressive disorder, single episode, unspecified: Secondary | ICD-10-CM | POA: Diagnosis not present

## 2022-01-14 DIAGNOSIS — K5900 Constipation, unspecified: Secondary | ICD-10-CM | POA: Diagnosis not present

## 2022-01-14 DIAGNOSIS — M961 Postlaminectomy syndrome, not elsewhere classified: Secondary | ICD-10-CM | POA: Diagnosis not present

## 2022-01-14 DIAGNOSIS — G894 Chronic pain syndrome: Secondary | ICD-10-CM | POA: Diagnosis not present

## 2022-01-18 DIAGNOSIS — M5451 Vertebrogenic low back pain: Secondary | ICD-10-CM | POA: Diagnosis not present

## 2022-01-18 DIAGNOSIS — M4716 Other spondylosis with myelopathy, lumbar region: Secondary | ICD-10-CM | POA: Diagnosis not present

## 2022-01-25 DIAGNOSIS — M4716 Other spondylosis with myelopathy, lumbar region: Secondary | ICD-10-CM | POA: Diagnosis not present

## 2022-01-25 DIAGNOSIS — M5451 Vertebrogenic low back pain: Secondary | ICD-10-CM | POA: Diagnosis not present

## 2022-02-03 ENCOUNTER — Encounter: Payer: Self-pay | Admitting: Family Medicine

## 2022-02-03 ENCOUNTER — Ambulatory Visit (INDEPENDENT_AMBULATORY_CARE_PROVIDER_SITE_OTHER): Payer: Medicare Other | Admitting: Family Medicine

## 2022-02-03 VITALS — BP 121/76 | HR 60 | Temp 97.7°F | Ht 66.0 in | Wt 187.4 lb

## 2022-02-03 DIAGNOSIS — F32A Depression, unspecified: Secondary | ICD-10-CM | POA: Diagnosis not present

## 2022-02-03 DIAGNOSIS — N3001 Acute cystitis with hematuria: Secondary | ICD-10-CM | POA: Diagnosis not present

## 2022-02-03 DIAGNOSIS — M25562 Pain in left knee: Secondary | ICD-10-CM

## 2022-02-03 DIAGNOSIS — F419 Anxiety disorder, unspecified: Secondary | ICD-10-CM | POA: Diagnosis not present

## 2022-02-03 DIAGNOSIS — R82998 Other abnormal findings in urine: Secondary | ICD-10-CM

## 2022-02-03 DIAGNOSIS — M1712 Unilateral primary osteoarthritis, left knee: Secondary | ICD-10-CM | POA: Diagnosis not present

## 2022-02-03 DIAGNOSIS — G8929 Other chronic pain: Secondary | ICD-10-CM | POA: Diagnosis not present

## 2022-02-03 DIAGNOSIS — R5382 Chronic fatigue, unspecified: Secondary | ICD-10-CM

## 2022-02-03 LAB — POCT URINALYSIS DIPSTICK
Bilirubin, UA: NEGATIVE
Glucose, UA: NEGATIVE
Ketones, UA: NEGATIVE
Protein, UA: POSITIVE — AB
Spec Grav, UA: >=1.03 — AB (ref 1.010–1.025)
Urobilinogen, UA: 1 E.U./dL
pH, UA: 5.5 (ref 5.0–8.0)

## 2022-02-03 MED ORDER — SULFAMETHOXAZOLE-TRIMETHOPRIM 800-160 MG PO TABS: 1.0000 | ORAL_TABLET | Freq: Two times a day (BID) | ORAL | 0 refills | Status: DC

## 2022-02-03 NOTE — Progress Notes (Signed)
OFFICE VISIT ? ?02/03/2022 ? ?CC:  ?Chief Complaint  ?Patient presents with  ? Anxiety  ? ?Patient is a 76 y.o. female who presents for 1 mo f/u gluteal tendinopathy and severe chronic anxiety. ? ?INTERIM HX: ?Still lots of anxiety, not sleeping well.  Husband in the midst of a lot of pain lately and requiring extra care, particularly at night. ?This has her fatigue even worse than normal. ?She endorses chronic depression which is unchanged from last visit.  No SI or HI. ? ?Chronic pain wears her down. ?The worst place lately is her left knee.  She has known severe osteoarthritis and has been scared to get total knee replacement.  She currently does not have an orthopedist that has evaluated or followed her for her left knee.   ? ? ?PMP AWARE reviewed today: most recent rx for alprazolam was filled 12/05/21, # 120, rx by me. ?No red flags. ? ? ?Past Medical History:  ?Diagnosis Date  ? Allergic rhinitis   ? Anxiety   ? Chest pain   ? cr  ? Chronic gastritis   ? dx'd by EGD 2018 (erosive, likely NSAID-induced). 06/2019 +gastritis.  ? Chronic pain syndrome   ? right knee osteo, hx of TKA in R knee.  Pt was told by [name] that no further surgical options are available for her; also, chronic lower back pain.--[name].  ? Chronic renal insufficiency, stage II (mild) 2015  ? vs acute fluctuations depending on hydration?---GFR from 60s up to 80s.  ? Diverticulosis of colon   ? with hx of 'itis  ? DJD (degenerative joint disease)   ? L spine and knees.  R knee inject 03/15/18, L knee inject 12/26/18 (by Dr. Lynann Bologna).  ? Epigastric pain   ? As of 01/2017, GI (Dr. Loletha Carrow) planned EGD.  ? FATIGUE / MALAISE 07/01/2009  ? Chronic  ? GERD (gastroesophageal reflux disease)   ? History of colonic polyps   ? HTN (hypertension)   ? Hypercholesterolemia   ? recommended restart of simvastatin 07/2018  ? Hypothyroid 07/05/2012  ? IBS (irritable bowel syndrome)   ? IC (interstitial cystitis)   ? [name] at [facility]  ?  Iron deficiency anemia, unspecified 06/27/2013  ? Has required IV iron on multiple occasions ([name]).  Hemoccults neg 12/2016. Most recent hem f/u labs normal 09/2020.  ? Lumbar back pain   ? Spinal stenosis, lumbar region with neurogenic claudication L5-S1 sx's--ESI's no help.  CT L spine stable post-surg appearance as of 12/2019  ? Major depression, recurrent ([place])   ? vs dysthymia,chronic  ? Memory impairment 10/03/2012  ? Memory impairment 10/03/2012 >referral to neuro    ? Monoclonal gammopathy of undetermined significance   ? IgM kappa MGUS ([name]) routine f/u has shown stability of this problem (most recent f/u 09/2020)  ? Neuromuscular disorder ([place])   ? OSA (obstructive sleep apnea)   ? Latest sleep study 01/2017.  Some adjustments to CPAP being made+ dental referral for consideration of oral appliance --per pulm MD.  ? Osteopenia 04/2014; 03/2018  ? 2015: T score -2.0 FRAX 11%/1.8%.  2019 FRAX 10%/1.5%.  10/2021 T score -1.6 radius, -0.9 femur  ? Otitis externa, candida 07/2015  ? [name] tx'd her with clotrimazole 1% external solution x 1 mo  ? Palpitations   ? Psoriasis   ? Trochanteric bursitis of both hips   ? + injections by ortho in the past  ? Vertigo 2020/21  ?  6+ wks, ->to ENT->?vestib neuronitis with ongoing motion sensitivity, gradually resolving.([name] 05/14/04). MRI brain normal 09/2020.  ? Vitamin D deficiency   ? Xerostomia   ? and xerophthalmia--per [name] (he suggests SSA, SSB, ESR, rh factor, ANA, and antimicrosmal and antithyroglobulin ab's as of 02/17/16--all these labs were NORMAL.  ? ? ?Past Surgical History:  ?Procedure Laterality Date  ? ABDOMINAL HYSTERECTOMY    ? for prolapse with abdominal incision  ? BACK SURGERY  01/23/2015  ? Lumbar decompression with L2-L5 fusion ([name])  ? CARDIAC CATHETERIZATION    ? no PCI  ? CHOLECYSTECTOMY    ? COLONOSCOPY  04/2006;07/16/19  ? Diverticulosis, o/w normal.  06/2019->2 adenomas, +Diverticulosis.  Recall 2023-2025.  ? DEXA   04/06/2018  ? T score -1.5 femur neck, -2.2 radius.  FRAX 10%/1.5%.  10/2021 T score -1.6 radius, -0.9 femur. rpt 2 yrs  ? ESOPHAGOGASTRODUODENOSCOPY  05/29/2009; 03/01/17;07/16/19  ? Esoph normal.  Gastritis (H pylori NEG) + gastric polyps (benign).  Duodenal biopsy NORMAL.  2018-mild chronic gastritis, H pylori NEG. 06/2019 tortuous esophagus w/out stenosis +chronic gastritis H pyl neg.  ? EYE SURGERY Left   ? "lazy eye"  ? HAND SURGERY Right   ? TRIGGER FINGER; index finger  ? HERNIA REPAIR    ? umbilical  ? KNEE ARTHROSCOPY    ? RIGHT KNEE X 5  ? LAMINOTOMY  2010  ? L-4, L-5 FUSION  ? NASAL SINUS SURGERY    ? polypectomy (remote past)  ? OOPHORECTOMY  2010  ? Laparoscopic BSO (path benign)  ? PATELLA FRACTURE SURGERY  06/2007  ? [name]  ? POSTERIOR LAMINECTOMY / DECOMPRESSION LUMBAR SPINE  05/2008  ? [name]  ? TOTAL KNEE ARTHROPLASTY  12-07 and 4-07  ? [name], revision 12-09 [name] at [facility]  ? ? ?Outpatient Medications Prior to Visit  ?Medication Sig Dispense Refill  ? acetaminophen (TYLENOL) 325 MG tablet Take 650 mg by mouth every 6 (six) hours as needed.    ? ALPRAZolam (XANAX) 1 MG tablet TAKE 1 TO 2 TABLETS BY MOUTH TWICE DAILY AS NEEDED 120 tablet 5  ? Azelastine HCl (ASTEPRO) 0.15 % SOLN 2 sprays each nostril every 12 hours 30 mL 11  ? calcium carbonate (OSCAL) 1500 (600 Ca) MG TABS tablet Take by mouth daily.    ? Cholecalciferol (VITAMIN D3) 5000 units CAPS Take 4 capsules by mouth daily.     ? cyclobenzaprine (FLEXERIL) 5 MG tablet Take 5 mg by mouth at bedtime. As needed    ? diclofenac Sodium (VOLTAREN) 1 % GEL Apply 4 g topically 4 (four) times daily.    ? doxylamine, Sleep, (UNISOM) 25 MG tablet Take 25 mg by mouth at bedtime as needed.    ? hydrocortisone 2.5 % cream Apply 1 application topically as needed. RECTAL ITCHING    ? levothyroxine (SYNTHROID) 75 MCG tablet Take 1 tablet by mouth once daily 90 tablet 0  ? meclizine (ANTIVERT) 25 MG tablet TAKE 1 TABLET BY MOUTH THREE TIMES DAILY AS  NEEDED FOR DIZZINESS 45 tablet 0  ? meloxicam (MOBIC) 7.5 MG tablet Take 2 tablets by mouth once daily 180 tablet 0  ? metoprolol tartrate (LOPRESSOR) 25 MG tablet Take 1 tablet (25 mg total) by mouth 2 (two) times daily. 180 tablet 3  ? Multiple Vitamins-Iron (MULTIVITAMIN/IRON PO) Take 1 tablet by mouth daily.    ? NIACIN PO Take by mouth daily.    ? omeprazole (PRILOSEC) 40 MG capsule  Take 1 capsule (40 mg total) by mouth daily. 90 capsule 3  ? Oxycodone HCl 10 MG TABS 10 mg every 4 (four) hours as needed.    ? polyethylene glycol (MIRALAX / GLYCOLAX) 17 g packet Take 17 g by mouth daily.    ? promethazine (PHENERGAN) 25 MG tablet TAKE ONE TABLET BY MOUTH EVERY 6 HOURS AS NEEDED FOR NAUSEA AND VOMITING 30 tablet 3  ? simvastatin (ZOCOR) 20 MG tablet Take 1 tablet (20 mg total) by mouth at bedtime. (Patient taking differently: Take 40 mg by mouth at bedtime.) 90 tablet 3  ? Specialty Vitamins Products (MAGNESIUM, AMINO ACID CHELATE,) 133 MG tablet Take 1 tablet by mouth daily.    ? tolterodine (DETROL LA) 4 MG 24 hr capsule     ? traZODone (DESYREL) 50 MG tablet Take 1 tablet by mouth at bedtime.  2  ? ?No facility-administered medications prior to visit.  ? ? ?Allergies  ?Allergen Reactions  ? Cymbalta [Duloxetine Hcl]   ?  Facial edema  ? Iohexol   ?   Code: HIVES, Desc: pt states her face and throat swells when administered IV contrast - KB, Onset Date: [id number] ?  ? Mepergan [Meperidine-Promethazine]   ?  Cardiac arrest  ? Morphine Rash  ? Augmentin [Amoxicillin-Pot Clavulanate] Nausea Only  ? Citalopram Other (See Comments)  ?  Jittery/heart racing ?  ? Hctz [Hydrochlorothiazide] Other (See Comments)  ?  Too much urinating, felt dried out, etc--NO ALLERGY  ? Lexapro [Escitalopram Oxalate] Other (See Comments)  ?  Jittery,heart racing  ? Lyrica [Pregabalin] Swelling  ?  Face, hands, fingers, ankles  ? Promethazine Hcl   ? Valsartan   ?  REACTION: pt states INTOL to Diovan  ? ?ROS ?As per HPI ? ?PE: ? ?   02/03/2022  ?  3:01 PM 12/30/2021  ?  1:10 PM 12/08/2021  ?  9:53 AM  ?Vitals with BMI  ?Height _0  _1  _2   ?Weight 187 lbs 6 oz 195 lbs 10 oz 197 lbs 6 oz  ?BMI 30.26 31.59 31.88  ?Systolic 976 734 12

## 2022-02-04 ENCOUNTER — Other Ambulatory Visit: Payer: Self-pay | Admitting: Family Medicine

## 2022-02-04 ENCOUNTER — Telehealth: Payer: Self-pay | Admitting: Family Medicine

## 2022-02-04 NOTE — Telephone Encounter (Signed)
Pt does not want to go to this provider states that is who did the operation on the knee that is messed up. Any other recommendations?

## 2022-02-04 NOTE — Telephone Encounter (Signed)
Please call Ramesha and tell her that I recommend Dr. Paralee Cancel with [facility] in [place] for her knee surgery.  If she is okay with this then please order referral, diagnosis is chronic left knee pain and primary osteoarthritis left knee.

## 2022-02-04 NOTE — Telephone Encounter (Signed)
LM for pt regarding recommendations.  

## 2022-02-05 LAB — URINE CULTURE
MICRO NUMBER:: 13410107
SPECIMEN QUALITY:: ADEQUATE

## 2022-02-05 NOTE — Telephone Encounter (Signed)
Last refill 08/25/21(30,3)    Please fill, if appropriate.

## 2022-02-09 ENCOUNTER — Telehealth: Payer: Self-pay | Admitting: Family Medicine

## 2022-02-09 NOTE — Telephone Encounter (Signed)
LM for pt to return call regarding recommendations.  

## 2022-02-09 NOTE — Telephone Encounter (Signed)
Pt will call back if she decides with referral.

## 2022-02-09 NOTE — Telephone Encounter (Signed)
Please call patient tell her that I would recommend Dr. Griffin Basil with [facility] in [place] for further evaluation of her knee.  If patient is open to this referral please order it, diagnosis is chronic left knee pain, primary osteoarthritis left knee.  Thanks.

## 2022-02-11 DIAGNOSIS — M961 Postlaminectomy syndrome, not elsewhere classified: Secondary | ICD-10-CM | POA: Diagnosis not present

## 2022-02-11 DIAGNOSIS — G894 Chronic pain syndrome: Secondary | ICD-10-CM | POA: Diagnosis not present

## 2022-02-11 DIAGNOSIS — M1712 Unilateral primary osteoarthritis, left knee: Secondary | ICD-10-CM | POA: Diagnosis not present

## 2022-02-11 DIAGNOSIS — K5900 Constipation, unspecified: Secondary | ICD-10-CM | POA: Diagnosis not present

## 2022-02-12 ENCOUNTER — Telehealth: Payer: Self-pay

## 2022-02-12 NOTE — Telephone Encounter (Signed)
She was told by pharmacy staff that medication needed a code for medication authorization. She will cut pills in half until Monday.

## 2022-02-12 NOTE — Telephone Encounter (Signed)
Patient refill request.  [facility] [address]   Patient will be add out of meds before end of weekend.  Patient wasn't sure if Dr. [name] approved these meds or Dr. Florene Glen (?)   Oxycodone HCl 10 MG TABS [[id number]]

## 2022-03-03 ENCOUNTER — Ambulatory Visit (INDEPENDENT_AMBULATORY_CARE_PROVIDER_SITE_OTHER): Payer: Medicare Other | Admitting: Family Medicine

## 2022-03-03 ENCOUNTER — Encounter: Payer: Self-pay | Admitting: Family Medicine

## 2022-03-03 VITALS — BP 109/70 | HR 56 | Temp 97.6°F | Ht 66.0 in | Wt 187.8 lb

## 2022-03-03 DIAGNOSIS — F331 Major depressive disorder, recurrent, moderate: Secondary | ICD-10-CM

## 2022-03-03 DIAGNOSIS — F411 Generalized anxiety disorder: Secondary | ICD-10-CM | POA: Diagnosis not present

## 2022-03-03 MED ORDER — TOLTERODINE TARTRATE ER 4 MG PO CP24: 4.0000 mg | ORAL_CAPSULE | Freq: Every day | ORAL | 1 refills | Status: DC

## 2022-03-03 MED ORDER — DESVENLAFAXINE SUCCINATE ER 50 MG PO TB24
50.0000 mg | ORAL_TABLET | Freq: Every day | ORAL | 1 refills | Status: DC
Start: 2022-03-03 — End: 2022-03-31

## 2022-03-03 NOTE — Progress Notes (Signed)
OFFICE VISIT  03/28/2022  CC:  Chief Complaint  Patient presents with   Anxiety    Patient is a 76 y.o. female who presents for follow-up anxiety as well as recent UTI. A/P as of last visit: "#1 chronic anxiety and depression as well as chronic fatigue. Stable.  Gave emotional support today.  She has declined trial of any antidepressants. She will continue alprazolam.   #2 chronic severe left knee pain, MRI 11/07/2021 confirmed advanced osteoarthritis. She asked me to pick out an orthopedist who would be best for her situation so I will do this and get back to her.   #3 dark urine.  At the end of the visit today she asked for urinalysis because she noted her urine appearing dark for the last 3 weeks. No dysuria or unusual urgency or frequency.  No fever.  She has diffuse low back pain but no significant flank pain. UA abnormal today.  We will treat for infection but this also could be her interstitial cystitis. Bactrim double strength, 1 twice daily x5 days. Urine sent for culture and sensitivities."  INTERIM HX: Struggling with anxiety and depression. Describes tough relationship issues with husband, longterm. Taking alprazolam as rx'd. No SI or HI.  Urine culture grew Klebsiella, showed sensitivity to multiple antibiotics including Bactrim that I prescribed her.  All sx's resolved.    PMP AWARE reviewed today: most recent rx for alprazolam was filled 02/04/2022, #120, rx by me. No red flags.  Past Medical History:  Diagnosis Date   Allergic rhinitis    Anxiety    Chest pain    cr   Chronic gastritis    dx'd by EGD 2018 (erosive, likely NSAID-induced). 06/2019 +gastritis.   Chronic pain syndrome    right knee osteo, hx of TKA in R knee.  Pt was told by [name] that no further surgical options are available for her; also, chronic lower back pain.--[name].   Chronic renal insufficiency, stage II (mild) 2015   vs acute fluctuations depending on  hydration?---GFR from 60s up to 80s.   Diverticulosis of colon    with hx of 'itis   DJD (degenerative joint disease)    L spine and knees.  R knee inject 03/15/18, L knee inject 12/26/18 (by Dr. Lynann Bologna).   Epigastric pain    As of 01/2017, GI (Dr. Loletha Carrow) planned EGD.   FATIGUE / MALAISE 07/01/2009   Chronic   GERD (gastroesophageal reflux disease)    History of colonic polyps    HTN (hypertension)    Hypercholesterolemia    recommended restart of simvastatin 07/2018   Hypothyroid 07/05/2012   IBS (irritable bowel syndrome)    IC (interstitial cystitis)    [name] at [facility]   Iron deficiency anemia, unspecified 06/27/2013   Has required IV iron on multiple occasions ([name]).  Hemoccults neg 12/2016. Most recent hem f/u labs normal 09/2020.   Lumbar back pain    Spinal stenosis, lumbar region with neurogenic claudication L5-S1 sx's--ESI's no help.  CT L spine stable post-surg appearance as of 12/2019   Major depression, recurrent ([place])    vs dysthymia,chronic   Memory impairment 10/03/2012   Memory impairment 10/03/2012 >referral to neuro     Monoclonal gammopathy of undetermined significance    IgM kappa MGUS ([name]) routine f/u has shown stability of this problem (most recent f/u 09/2020)   Neuromuscular disorder ([facility])    OSA (obstructive sleep apnea)    Latest sleep study  01/2017.  Some adjustments to CPAP being made+ dental referral for consideration of oral appliance --per pulm MD.   Osteopenia 04/2014; 03/2018   2015: T score -2.0 FRAX 11%/1.8%.  2019 FRAX 10%/1.5%.  10/2021 T score -1.6 radius, -0.9 femur   Otitis externa, candida 75/4492   [name] tx'd her with clotrimazole 1% external solution x 1 mo   Palpitations    Psoriasis    Trochanteric bursitis of both hips    + injections by ortho in the past   Vertigo 2020/21   6+ wks, ->to ENT->?vestib neuronitis with ongoing motion sensitivity, gradually resolving.([name] 0/10/07). MRI brain normal 09/2020.    Vitamin D deficiency    Xerostomia    and xerophthalmia--per [name] (he suggests SSA, SSB, ESR, rh factor, ANA, and antimicrosmal and antithyroglobulin ab's as of 02/17/16--all these labs were NORMAL.    Past Surgical History:  Procedure Laterality Date   ABDOMINAL HYSTERECTOMY     for prolapse with abdominal incision   BACK SURGERY  01/23/2015   Lumbar decompression with L2-L5 fusion ([name])   [place]     no PCI   CHOLECYSTECTOMY     COLONOSCOPY  04/2006;07/16/19   Diverticulosis, o/w normal.  06/2019->2 adenomas, +Diverticulosis.  Recall 2023-2025.   DEXA  04/06/2018   T score -1.5 femur neck, -2.2 radius.  FRAX 10%/1.5%.  10/2021 T score -1.6 radius, -0.9 femur. rpt 2 yrs   ESOPHAGOGASTRODUODENOSCOPY  05/29/2009; 03/01/17;07/16/19   Esoph normal.  Gastritis (H pylori NEG) + gastric polyps (benign).  Duodenal biopsy NORMAL.  2018-mild chronic gastritis, H pylori NEG. 06/2019 tortuous esophagus w/out stenosis +chronic gastritis H pyl neg.   EYE SURGERY Left    "lazy eye"   HAND SURGERY Right    TRIGGER FINGER; index finger   HERNIA REPAIR     umbilical   KNEE ARTHROSCOPY     RIGHT KNEE X 5   LAMINOTOMY  2010   L-4, L-5 FUSION   NASAL SINUS SURGERY     polypectomy (remote past)   OOPHORECTOMY  2010   Laparoscopic BSO (path benign)   PATELLA FRACTURE SURGERY  06/2007   [name]   POSTERIOR LAMINECTOMY / DECOMPRESSION LUMBAR SPINE  05/2008   [name]   TOTAL KNEE ARTHROPLASTY  12-07 and 4-07   [name], revision 12-09 [name] at [facility]    Outpatient Medications Prior to Visit  Medication Sig Dispense Refill   acetaminophen (TYLENOL) 325 MG tablet Take 650 mg by mouth every 6 (six) hours as needed.     ALPRAZolam (XANAX) 1 MG tablet TAKE 1 TO 2 TABLETS BY MOUTH TWICE DAILY AS NEEDED 120 tablet 5   Azelastine HCl (ASTEPRO) 0.15 % SOLN 2 sprays each nostril every 12 hours 30 mL 11   calcium carbonate (OSCAL) 1500 (600 Ca) MG TABS tablet Take by mouth  daily.     Cholecalciferol (VITAMIN D3) 5000 units CAPS Take 4 capsules by mouth daily.      cyclobenzaprine (FLEXERIL) 5 MG tablet Take 5 mg by mouth at bedtime. As needed     diclofenac Sodium (VOLTAREN) 1 % GEL Apply 4 g topically 4 (four) times daily.     doxylamine, Sleep, (UNISOM) 25 MG tablet Take 25 mg by mouth at bedtime as needed.     levothyroxine (SYNTHROID) 75 MCG tablet Take 1 tablet by mouth once daily 90 tablet 0   meclizine (ANTIVERT) 25 MG tablet TAKE 1 TABLET BY MOUTH THREE TIMES DAILY  AS NEEDED FOR DIZZINESS 45 tablet 0   meloxicam (MOBIC) 7.5 MG tablet Take 2 tablets by mouth once daily 180 tablet 0   metoprolol tartrate (LOPRESSOR) 25 MG tablet Take 1 tablet (25 mg total) by mouth 2 (two) times daily. 180 tablet 3   Multiple Vitamins-Iron (MULTIVITAMIN/IRON PO) Take 1 tablet by mouth daily.     NIACIN PO Take by mouth daily.     omeprazole (PRILOSEC) 40 MG capsule Take 1 capsule (40 mg total) by mouth daily. 90 capsule 3   Oxycodone HCl 10 MG TABS 10 mg every 4 (four) hours as needed.     polyethylene glycol (MIRALAX / GLYCOLAX) 17 g packet Take 17 g by mouth daily.     promethazine (PHENERGAN) 25 MG tablet TAKE 1 TABLET BY MOUTH EVERY 6 HOURS AS NEEDED FOR NAUSEA AND VOMITING 90 tablet 2   simvastatin (ZOCOR) 20 MG tablet Take 1 tablet (20 mg total) by mouth at bedtime. (Patient taking differently: Take 40 mg by mouth at bedtime.) 90 tablet 3   Specialty Vitamins Products (MAGNESIUM, AMINO ACID CHELATE,) 133 MG tablet Take 1 tablet by mouth daily.     traZODone (DESYREL) 50 MG tablet Take 1 tablet by mouth at bedtime.  2   tolterodine (DETROL LA) 4 MG 24 hr capsule      hydrocortisone 2.5 % cream Apply 1 application topically as needed. RECTAL ITCHING (Patient not taking: Reported on 03/03/2022)     sulfamethoxazole-trimethoprim (BACTRIM DS) 800-160 MG tablet Take 1 tablet by mouth 2 (two) times daily. (Patient not taking: Reported on 03/03/2022) 10 tablet 0   No  facility-administered medications prior to visit.    Allergies  Allergen Reactions   Cymbalta [Duloxetine Hcl]     Facial edema   Iohexol      Code: HIVES, Desc: pt states her face and throat swells when administered IV contrast - KB, Onset Date: [id number]    Mepergan [Meperidine-Promethazine]     Cardiac arrest   Morphine Rash   Augmentin [Amoxicillin-Pot Clavulanate] Nausea Only   Citalopram Other (See Comments)    Jittery/heart racing    Hctz [Hydrochlorothiazide] Other (See Comments)    Too much urinating, felt dried out, etc--NO ALLERGY   Lexapro [Escitalopram Oxalate] Other (See Comments)    Jittery,heart racing   Lyrica [Pregabalin] Swelling    Face, hands, fingers, ankles   Promethazine Hcl    Valsartan     REACTION: pt states INTOL to Diovan    ROS As per HPI  PE:    03/03/2022    1:02 PM 02/03/2022    3:01 PM 12/30/2021    1:10 PM  Vitals with BMI  Height _0  _1  _2   Weight 187 lbs 13 oz 187 lbs 6 oz 195 lbs 10 oz  BMI 30.33 99.83 38.25  Systolic 053 976 734  Diastolic 70 76 74  Pulse 56 60 59     Physical Exam  Gen: Alert, well appearing.  Patient is oriented to person, place, time, and situation. AFFECT: pleasant, lucid thought and speech. No further exam today.  LABS:  Last CBC Lab Results  Component Value Date   WBC 7.7 11/10/2021   HGB 14.6 11/10/2021   HCT 43.8 11/10/2021   MCV 86.4 11/10/2021   MCH 28.8 11/10/2021   RDW 12.8 11/10/2021   PLT 219 11/10/2021   Lab Results  Component Value Date   TSH 0.90 19/37/9024   Last metabolic panel Lab Results  Component  Value Date   GLUCOSE 120 (H) 11/10/2021   NA 139 11/10/2021   K 4.0 11/10/2021   CL 103 11/10/2021   CO2 28 11/10/2021   BUN 17 11/10/2021   CREATININE 1.00 11/10/2021   GFRNONAA 59 (L) 11/10/2021   CALCIUM 8.9 11/10/2021   PROT 7.2 11/10/2021   ALBUMIN 4.0 11/10/2021   LABGLOB 3.3 11/10/2021   AGRATIO 1.1 11/10/2021   BILITOT 0.6 11/10/2021   ALKPHOS 97  11/10/2021   AST 17 11/10/2021   ALT 17 11/10/2021   ANIONGAP 8 11/10/2021   IMPRESSION AND PLAN:  1) Chronic anxiety and depression. She is open to a trial of antidepressant. Has tried and failed and/or not tolerated many antidepressants in the past. Start pristiq 41m qd. Continue alpraz 154m 1-2 bid prn.  No new rx needed today.  An After Visit Summary was printed and given to the patient.  FOLLOW UP: Return in about 4 weeks (around 03/31/2022) for f/u anx/dep.  Signed:  PhCrissie SicklesMD           03/28/2022

## 2022-03-11 DIAGNOSIS — K5900 Constipation, unspecified: Secondary | ICD-10-CM | POA: Diagnosis not present

## 2022-03-11 DIAGNOSIS — M961 Postlaminectomy syndrome, not elsewhere classified: Secondary | ICD-10-CM | POA: Diagnosis not present

## 2022-03-11 DIAGNOSIS — G894 Chronic pain syndrome: Secondary | ICD-10-CM | POA: Diagnosis not present

## 2022-03-11 DIAGNOSIS — M1712 Unilateral primary osteoarthritis, left knee: Secondary | ICD-10-CM | POA: Diagnosis not present

## 2022-03-31 ENCOUNTER — Encounter: Payer: Self-pay | Admitting: Family Medicine

## 2022-03-31 ENCOUNTER — Ambulatory Visit (INDEPENDENT_AMBULATORY_CARE_PROVIDER_SITE_OTHER): Payer: Medicare Other | Admitting: Family Medicine

## 2022-03-31 ENCOUNTER — Other Ambulatory Visit: Payer: Self-pay

## 2022-03-31 VITALS — BP 115/75 | HR 71 | Temp 97.5°F | Ht 66.0 in | Wt 185.6 lb

## 2022-03-31 DIAGNOSIS — F411 Generalized anxiety disorder: Secondary | ICD-10-CM

## 2022-03-31 DIAGNOSIS — E039 Hypothyroidism, unspecified: Secondary | ICD-10-CM

## 2022-03-31 DIAGNOSIS — F331 Major depressive disorder, recurrent, moderate: Secondary | ICD-10-CM | POA: Diagnosis not present

## 2022-03-31 MED ORDER — DESVENLAFAXINE SUCCINATE ER 100 MG PO TB24: 100.0000 mg | ORAL_TABLET | Freq: Every day | ORAL | 1 refills | Status: DC

## 2022-03-31 MED ORDER — ALPRAZOLAM 1 MG PO TABS
ORAL_TABLET | ORAL | 5 refills | Status: DC
Start: 2022-03-31 — End: 2022-11-10

## 2022-03-31 MED ORDER — LEVOTHYROXINE SODIUM 75 MCG PO TABS: 75.0000 ug | ORAL_TABLET | Freq: Every day | ORAL | 0 refills | Status: DC

## 2022-03-31 MED ORDER — SIMVASTATIN 20 MG PO TABS: 20.0000 mg | ORAL_TABLET | Freq: Every day | ORAL | 1 refills | Status: DC

## 2022-03-31 NOTE — Progress Notes (Signed)
OFFICE VISIT  03/31/2022  CC:  Chief Complaint  Patient presents with   Anxiety   Depression   Toe Concern    Pt moved husband's wheelchair about 4 days ago and stumped her great big L toe. Toe is still discolored but starting to regain original color. No longer painful to touch.    Patient is a 76 y.o. female who presents for 1 month follow-up anxiety and depression.  Also has a toe concern. A/P as of last visit: "1) Chronic anxiety and depression. She is open to a trial of antidepressant. Has tried and failed and/or not tolerated many antidepressants in the past. Start pristiq 31m qd. Continue alpraz 118m 1-2 bid prn.  No new rx needed today."  INTERIM HX: No significant change in chronic anxiety and depression no adverse side effects from Pristiq.  She is still hampered by chronic pain, particularly her left knee.  Past Medical History:  Diagnosis Date   Allergic rhinitis    Anxiety    Chest pain    cr   Chronic gastritis    dx'd by EGD 2018 (erosive, likely NSAID-induced). 06/2019 +gastritis.   Chronic pain syndrome    right knee osteo, hx of TKA in R knee.  Pt was told by [name] that no further surgical options are available for her; also, chronic lower back pain.--[name].   Chronic renal insufficiency, stage II (mild) 2015   vs acute fluctuations depending on hydration?---GFR from 60s up to 80s.   Diverticulosis of colon    with hx of 'itis   DJD (degenerative joint disease)    L spine and knees.  R knee inject 03/15/18, L knee inject 12/26/18 (by Dr. VoLynann Bologna   Epigastric pain    As of 01/2017, GI (Dr. DaLoletha Carrowplanned EGD.   FATIGUE / MALAISE 07/01/2009   Chronic   GERD (gastroesophageal reflux disease)    History of colonic polyps    HTN (hypertension)    Hypercholesterolemia    recommended restart of simvastatin 07/2018   Hypothyroid 07/05/2012   IBS (irritable bowel syndrome)    IC (interstitial cystitis)    [name] at [facility]    Iron deficiency anemia, unspecified 06/27/2013   Has required IV iron on multiple occasions (Dr. EnMarin Olp  Hemoccults neg 12/2016. Most recent hem f/u labs normal 09/2020.   Lumbar back pain    Spinal stenosis, lumbar region with neurogenic claudication L5-S1 sx's--ESI's no help.  CT L spine stable post-surg appearance as of 12/2019   Major depression, recurrent ([place]   vs dysthymia,chronic   Memory impairment 10/03/2012   Memory impairment 10/03/2012 >referral to neuro     Monoclonal gammopathy of undetermined significance    IgM kappa MGUS (Dr. EnMarin Olproutine f/u has shown stability of this problem (most recent f/u 09/2020)   Neuromuscular disorder ([place]   OSA (obstructive sleep apnea)    Latest sleep study 01/2017.  Some adjustments to CPAP being made+ dental referral for consideration of oral appliance --per pulm MD.   Osteopenia 04/2014; 03/2018   2015: T score -2.0 FRAX 11%/1.8%.  2019 FRAX 10%/1.5%.  10/2021 T score -1.6 radius, -0.9 femur   Otitis externa, candida 1174/1423 Dr. WoErik Obeyx'd her with clotrimazole 1% external solution x 1 mo   Palpitations    Psoriasis    Trochanteric bursitis of both hips    + injections by ortho in the past   Vertigo 2020/21   6+ wks, ->to ENT->?vestib  neuronitis with ongoing motion sensitivity, gradually resolving.([name] 9/76/73). MRI brain normal 09/2020.   Vitamin D deficiency    Xerostomia    and xerophthalmia--per [name] (he suggests SSA, SSB, ESR, rh factor, ANA, and antimicrosmal and antithyroglobulin ab's as of 02/17/16--all these labs were NORMAL.    Past Surgical History:  Procedure Laterality Date   ABDOMINAL HYSTERECTOMY     for prolapse with abdominal incision   BACK SURGERY  01/23/2015   Lumbar decompression with L2-L5 fusion ([name])   [place]     no PCI   CHOLECYSTECTOMY     COLONOSCOPY  04/2006;07/16/19   Diverticulosis, o/w normal.  06/2019->2 adenomas, +Diverticulosis.  Recall 2023-2025.   DEXA   04/06/2018   T score -1.5 femur neck, -2.2 radius.  FRAX 10%/1.5%.  10/2021 T score -1.6 radius, -0.9 femur. rpt 2 yrs   ESOPHAGOGASTRODUODENOSCOPY  05/29/2009; 03/01/17;07/16/19   Esoph normal.  Gastritis (H pylori NEG) + gastric polyps (benign).  Duodenal biopsy NORMAL.  2018-mild chronic gastritis, H pylori NEG. 06/2019 tortuous esophagus w/out stenosis +chronic gastritis H pyl neg.   EYE SURGERY Left    "lazy eye"   HAND SURGERY Right    TRIGGER FINGER; index finger   HERNIA REPAIR     umbilical   KNEE ARTHROSCOPY     RIGHT KNEE X 5   LAMINOTOMY  2010   L-4, L-5 FUSION   NASAL SINUS SURGERY     polypectomy (remote past)   OOPHORECTOMY  2010   Laparoscopic BSO (path benign)   PATELLA FRACTURE SURGERY  06/2007   [name]   POSTERIOR LAMINECTOMY / DECOMPRESSION LUMBAR SPINE  05/2008   [name]   TOTAL KNEE ARTHROPLASTY  12-07 and 4-07   [name], revision 12-09 [name] at [facility]    Outpatient Medications Prior to Visit  Medication Sig Dispense Refill   acetaminophen (TYLENOL) 325 MG tablet Take 650 mg by mouth every 6 (six) hours as needed.     ALPRAZolam (XANAX) 1 MG tablet TAKE 1 TO 2 TABLETS BY MOUTH TWICE DAILY AS NEEDED 120 tablet 5   Azelastine HCl (ASTEPRO) 0.15 % SOLN 2 sprays each nostril every 12 hours 30 mL 11   calcium carbonate (OSCAL) 1500 (600 Ca) MG TABS tablet Take by mouth daily.     Cholecalciferol (VITAMIN D3) 5000 units CAPS Take 4 capsules by mouth daily.      cyclobenzaprine (FLEXERIL) 5 MG tablet Take 5 mg by mouth at bedtime. As needed     desvenlafaxine (PRISTIQ) 50 MG 24 hr tablet Take 1 tablet (50 mg total) by mouth daily. 30 tablet 1   diclofenac Sodium (VOLTAREN) 1 % GEL Apply 4 g topically 4 (four) times daily.     doxylamine, Sleep, (UNISOM) 25 MG tablet Take 25 mg by mouth at bedtime as needed.     hydrocortisone 2.5 % cream Apply 1 application  topically as needed. RECTAL ITCHING     levothyroxine (SYNTHROID) 75 MCG tablet Take 1 tablet (75 mcg  total) by mouth daily. 90 tablet 0   meclizine (ANTIVERT) 25 MG tablet TAKE 1 TABLET BY MOUTH THREE TIMES DAILY AS NEEDED FOR DIZZINESS 45 tablet 0   meloxicam (MOBIC) 7.5 MG tablet Take 2 tablets by mouth once daily 180 tablet 0   metoprolol tartrate (LOPRESSOR) 25 MG tablet Take 1 tablet (25 mg total) by mouth 2 (two) times daily. 180 tablet 3   Multiple Vitamins-Iron (MULTIVITAMIN/IRON PO) Take 1 tablet by mouth  daily.     NIACIN PO Take by mouth daily.     omeprazole (PRILOSEC) 40 MG capsule Take 1 capsule (40 mg total) by mouth daily. 90 capsule 3   Oxycodone HCl 10 MG TABS 10 mg every 4 (four) hours as needed.     polyethylene glycol (MIRALAX / GLYCOLAX) 17 g packet Take 17 g by mouth daily.     promethazine (PHENERGAN) 25 MG tablet TAKE 1 TABLET BY MOUTH EVERY 6 HOURS AS NEEDED FOR NAUSEA AND VOMITING 90 tablet 2   simvastatin (ZOCOR) 20 MG tablet Take 1 tablet (20 mg total) by mouth at bedtime. (Patient taking differently: Take 40 mg by mouth at bedtime.) 90 tablet 3   Specialty Vitamins Products (MAGNESIUM, AMINO ACID CHELATE,) 133 MG tablet Take 1 tablet by mouth daily.     tolterodine (DETROL LA) 4 MG 24 hr capsule Take 1 capsule (4 mg total) by mouth daily. 90 capsule 1   traZODone (DESYREL) 50 MG tablet Take 1 tablet by mouth at bedtime.  2   No facility-administered medications prior to visit.    Allergies  Allergen Reactions   Cymbalta [Duloxetine Hcl]     Facial edema   Iohexol      Code: HIVES, Desc: pt states her face and throat swells when administered IV contrast - KB, Onset Date: [id number]    Mepergan [Meperidine-Promethazine]     Cardiac arrest   Morphine Rash   Augmentin [Amoxicillin-Pot Clavulanate] Nausea Only   Citalopram Other (See Comments)    Jittery/heart racing    Hctz [Hydrochlorothiazide] Other (See Comments)    Too much urinating, felt dried out, etc--NO ALLERGY   Lexapro [Escitalopram Oxalate] Other (See Comments)    Jittery,heart racing   Lyrica  [Pregabalin] Swelling    Face, hands, fingers, ankles   Promethazine Hcl    Valsartan     REACTION: pt states INTOL to Diovan    ROS As per HPI  PE:    03/31/2022    1:14 PM 03/03/2022    1:02 PM 02/03/2022    3:01 PM  Vitals with BMI  Height _0  _1  _2   Weight 185 lbs 10 oz 187 lbs 13 oz 187 lbs 6 oz  BMI 29.97 79.03 83.33  Systolic 832 919 166  Diastolic 75 70 76  Pulse 71 56 60     Physical Exam  Gen: Alert, well appearing.  Patient is oriented to person, place, time, and situation. AFFECT: pleasant, lucid thought and speech. No further exam today.  LABS:  Last CBC Lab Results  Component Value Date   WBC 7.7 11/10/2021   HGB 14.6 11/10/2021   HCT 43.8 11/10/2021   MCV 86.4 11/10/2021   MCH 28.8 11/10/2021   RDW 12.8 11/10/2021   PLT 219 11/10/2021   Lab Results  Component Value Date   TSH 0.90 06/00/4599   Last metabolic panel Lab Results  Component Value Date   GLUCOSE 120 (H) 11/10/2021   NA 139 11/10/2021   K 4.0 11/10/2021   CL 103 11/10/2021   CO2 28 11/10/2021   BUN 17 11/10/2021   CREATININE 1.00 11/10/2021   GFRNONAA 59 (L) 11/10/2021   CALCIUM 8.9 11/10/2021   PROT 7.2 11/10/2021   ALBUMIN 4.0 11/10/2021   LABGLOB 3.3 11/10/2021   AGRATIO 1.1 11/10/2021   BILITOT 0.6 11/10/2021   ALKPHOS 97 11/10/2021   AST 17 11/10/2021   ALT 17 11/10/2021   ANIONGAP 8 11/10/2021   IMPRESSION  AND PLAN:  GAD, recurrent major depressive disorder. Increase Pristiq to 100 mg a day.  Chronic pain, multifactorial. Left knee severe DJD is most prominent--she is in the process of getting this further evaluated by orthopedics.  An After Visit Summary was printed and given to the patient.  FOLLOW UP: No follow-ups on file.  Signed:  Crissie Sickles, MD           03/31/2022

## 2022-04-08 DIAGNOSIS — G894 Chronic pain syndrome: Secondary | ICD-10-CM | POA: Diagnosis not present

## 2022-04-08 DIAGNOSIS — M1712 Unilateral primary osteoarthritis, left knee: Secondary | ICD-10-CM | POA: Diagnosis not present

## 2022-04-08 DIAGNOSIS — K5900 Constipation, unspecified: Secondary | ICD-10-CM | POA: Diagnosis not present

## 2022-04-08 DIAGNOSIS — M961 Postlaminectomy syndrome, not elsewhere classified: Secondary | ICD-10-CM | POA: Diagnosis not present

## 2022-04-20 DIAGNOSIS — M1712 Unilateral primary osteoarthritis, left knee: Secondary | ICD-10-CM | POA: Diagnosis not present

## 2022-04-28 ENCOUNTER — Ambulatory Visit (INDEPENDENT_AMBULATORY_CARE_PROVIDER_SITE_OTHER): Payer: Medicare Other

## 2022-04-28 DIAGNOSIS — Z0000 Encounter for general adult medical examination without abnormal findings: Secondary | ICD-10-CM | POA: Diagnosis not present

## 2022-04-28 NOTE — Progress Notes (Addendum)
Virtual Visit via Telephone Note  I connected with  [name] on 04/28/22 at  1:45 PM EDT by telephone and verified that I am speaking with the correct person using two identifiers.  Medicare Annual Wellness visit completed telephonically due to Covid-19 pandemic.   Persons participating in this call: This Health Coach and this patient.   Location: Patient: home Provider: office   I discussed the limitations, risks, security and privacy concerns of performing an evaluation and management service by telephone and the availability of in person appointments. The patient expressed understanding and agreed to proceed.  Unable to perform video visit due to video visit attempted and failed and/or patient does not have video capability.   Some vital signs may be absent or patient reported.   Willette Brace, LPN   Subjective:   [name] is a 76 y.o. female who presents for Medicare Annual (Subsequent) preventive examination.  Review of Systems     Cardiac Risk Factors include: advanced age (>88mn, >>56women);hypertension;obesity (BMI >30kg/m2)     Objective:    There were no vitals filed for this visit. There is no height or weight on file to calculate BMI.     04/28/2022    1:47 PM 11/10/2021    9:22 AM 04/22/2021    2:57 PM 04/10/2021   12:17 PM 10/10/2020   11:47 AM 04/09/2020   11:59 AM 10/04/2019   10:38 AM  Advanced Directives  Does Patient Have a Medical Advance Directive? Yes Yes No Yes No Yes Yes  Type of Advance Directive Living will;Healthcare Power of Attorney Living will;[place]  [place]  Does patient want to make changes to medical advance directive?      No - Patient declined   Copy of [place]? No - copy requested No - copy requested       Would patient like information on creating a  medical advance directive?   No - Patient declined  No - Patient declined      Current Medications (verified) Outpatient Encounter Medications as of 04/28/2022  Medication Sig   acetaminophen (TYLENOL) 325 MG tablet Take 650 mg by mouth every 6 (six) hours as needed.   ALPRAZolam (XANAX) 1 MG tablet TAKE 1 TO 2 TABLETS BY MOUTH TWICE DAILY AS NEEDED   Azelastine HCl (ASTEPRO) 0.15 % SOLN 2 sprays each nostril every 12 hours   calcium carbonate (OSCAL) 1500 (600 Ca) MG TABS tablet Take by mouth daily.   Cholecalciferol (VITAMIN D3) 5000 units CAPS Take 4 capsules by mouth daily.    desvenlafaxine (PRISTIQ) 100 MG 24 hr tablet Take 1 tablet (100 mg total) by mouth daily.   diclofenac Sodium (VOLTAREN) 1 % GEL Apply 4 g topically 4 (four) times daily.   doxylamine, Sleep, (UNISOM) 25 MG tablet Take 25 mg by mouth at bedtime as needed.   hydrocortisone 2.5 % cream Apply 1 application  topically as needed. RECTAL ITCHING   levothyroxine (SYNTHROID) 75 MCG tablet Take 1 tablet (75 mcg total) by mouth daily.   meclizine (ANTIVERT) 25 MG tablet TAKE 1 TABLET BY MOUTH THREE TIMES DAILY AS NEEDED FOR DIZZINESS   metoprolol tartrate (LOPRESSOR) 25 MG tablet Take 1 tablet (25 mg total) by mouth 2 (two) times daily.   Multiple Vitamins-Iron (MULTIVITAMIN/IRON PO) Take 1 tablet by mouth daily.   NIACIN PO Take by  mouth daily.   omeprazole (PRILOSEC) 40 MG capsule Take 1 capsule (40 mg total) by mouth daily.   Oxycodone HCl 10 MG TABS 10 mg every 4 (four) hours as needed.   polyethylene glycol (MIRALAX / GLYCOLAX) 17 g packet Take 17 g by mouth daily.   promethazine (PHENERGAN) 25 MG tablet TAKE 1 TABLET BY MOUTH EVERY 6 HOURS AS NEEDED FOR NAUSEA AND VOMITING   simvastatin (ZOCOR) 20 MG tablet Take 1 tablet (20 mg total) by mouth at bedtime.   Specialty Vitamins Products (MAGNESIUM, AMINO ACID CHELATE,) 133 MG tablet Take 1 tablet by mouth daily.   tolterodine (DETROL LA) 4 MG 24 hr capsule Take 1 capsule  (4 mg total) by mouth daily.   cyclobenzaprine (FLEXERIL) 5 MG tablet Take 5 mg by mouth at bedtime. As needed (Patient not taking: Reported on 04/28/2022)   meloxicam (MOBIC) 7.5 MG tablet Take 2 tablets by mouth once daily (Patient not taking: Reported on 04/28/2022)   traZODone (DESYREL) 50 MG tablet Take 1 tablet by mouth at bedtime. (Patient not taking: Reported on 04/28/2022)   No facility-administered encounter medications on file as of 04/28/2022.    Allergies (verified) Cymbalta [duloxetine hcl], Iohexol, Mepergan [meperidine-promethazine], Morphine, Augmentin [amoxicillin-pot clavulanate], Citalopram, Hctz [hydrochlorothiazide], Lexapro [escitalopram oxalate], Lyrica [pregabalin], Promethazine hcl, and Valsartan   History: Past Medical History:  Diagnosis Date   Allergic rhinitis    Anxiety    Chest pain    cr   Chronic gastritis    dx'd by EGD 2018 (erosive, likely NSAID-induced). 06/2019 +gastritis.   Chronic pain syndrome    right knee osteo, hx of TKA in R knee.  Pt was told by [name] that no further surgical options are available for her; also, chronic lower back pain.--[name].   Chronic renal insufficiency, stage II (mild) 2015   vs acute fluctuations depending on hydration?---GFR from 60s up to 80s.   Diverticulosis of colon    with hx of 'itis   DJD (degenerative joint disease)    L spine and knees.  R knee inject 03/15/18, L knee inject 12/26/18 (by Dr. Lynann Bologna).   Epigastric pain    As of 01/2017, GI (Dr. Loletha Carrow) planned EGD.   FATIGUE / MALAISE 07/01/2009   Chronic   GERD (gastroesophageal reflux disease)    History of colonic polyps    HTN (hypertension)    Hypercholesterolemia    recommended restart of simvastatin 07/2018   Hypothyroid 07/05/2012   IBS (irritable bowel syndrome)    IC (interstitial cystitis)    [name] at [facility]   Iron deficiency anemia, unspecified 06/27/2013   Has required IV iron on multiple occasions ([name]).   Hemoccults neg 12/2016. Most recent hem f/u labs normal 09/2020.   Lumbar back pain    Spinal stenosis, lumbar region with neurogenic claudication L5-S1 sx's--ESI's no help.  CT L spine stable post-surg appearance as of 12/2019   Major depression, recurrent ([place])    vs dysthymia,chronic   Memory impairment 10/03/2012   Memory impairment 10/03/2012 >referral to neuro     Monoclonal gammopathy of undetermined significance    IgM kappa MGUS ([name]) routine f/u has shown stability of this problem (most recent f/u 09/2020)   Neuromuscular disorder ([place])    OSA (obstructive sleep apnea)    Latest sleep study 01/2017.  Some adjustments to CPAP being made+ dental referral for consideration of oral appliance --per pulm MD.   Osteopenia 04/2014; 03/2018   2015:  T score -2.0 FRAX 11%/1.8%.  2019 FRAX 10%/1.5%.  10/2021 T score -1.6 radius, -0.9 femur   Otitis externa, candida 43/5686   [name] tx'd her with clotrimazole 1% external solution x 1 mo   Palpitations    Psoriasis    Trochanteric bursitis of both hips    + injections by ortho in the past   Vertigo 2020/21   6+ wks, ->to ENT->?vestib neuronitis with ongoing motion sensitivity, gradually resolving.([name] 1/68/37). MRI brain normal 09/2020.   Vitamin D deficiency    Xerostomia    and xerophthalmia--per [name] (he suggests SSA, SSB, ESR, rh factor, ANA, and antimicrosmal and antithyroglobulin ab's as of 02/17/16--all these labs were NORMAL.   Past Surgical History:  Procedure Laterality Date   ABDOMINAL HYSTERECTOMY     for prolapse with abdominal incision   BACK SURGERY  01/23/2015   Lumbar decompression with L2-L5 fusion ([name])   [place]     no PCI   CHOLECYSTECTOMY     COLONOSCOPY  04/2006;07/16/19   Diverticulosis, o/w normal.  06/2019->2 adenomas, +Diverticulosis.  Recall 2023-2025.   DEXA  04/06/2018   T score -1.5 femur neck, -2.2 radius.  FRAX 10%/1.5%.  10/2021 T score -1.6 radius, -0.9 femur.  rpt 2 yrs   ESOPHAGOGASTRODUODENOSCOPY  05/29/2009; 03/01/17;07/16/19   Esoph normal.  Gastritis (H pylori NEG) + gastric polyps (benign).  Duodenal biopsy NORMAL.  2018-mild chronic gastritis, H pylori NEG. 06/2019 tortuous esophagus w/out stenosis +chronic gastritis H pyl neg.   EYE SURGERY Left    "lazy eye"   HAND SURGERY Right    TRIGGER FINGER; index finger   HERNIA REPAIR     umbilical   KNEE ARTHROSCOPY     RIGHT KNEE X 5   LAMINOTOMY  2010   L-4, L-5 FUSION   NASAL SINUS SURGERY     polypectomy (remote past)   OOPHORECTOMY  2010   Laparoscopic BSO (path benign)   PATELLA FRACTURE SURGERY  06/2007   [name]   POSTERIOR LAMINECTOMY / DECOMPRESSION LUMBAR SPINE  05/2008   [name]   TOTAL KNEE ARTHROPLASTY  12-07 and 4-07   [name], revision 12-09 [name] at [place] History  Problem Relation Age of Onset   Leukemia Brother    Lung disease Brother    Thyroid disease Brother    Melanoma Brother    Hypertension Mother    Thyroid disease Mother    Hypertension Father    Breast cancer Sister        Age 61's   Hypertension Sister    Coronary artery disease Sister    Lupus Sister    Thyroid disease Sister    Stomach cancer Brother    Cancer Other        "all over"   Parkinson's disease Sister    Colon cancer Neg Hx    Esophageal cancer Neg Hx    Rectal cancer Neg Hx    Social History   Socioeconomic History   Marital status: Married    Spouse name: Bristyl Mclees   Number of children: 0   Years of education: Not on file   Highest education level: Not on file  Occupational History    Employer: RETIRED  Tobacco Use   Smoking status: Never   Smokeless tobacco: Never   Tobacco comments:    NEVER USED TOBACCO  Vaping Use   Vaping Use: Never used  Substance and Sexual Activity   Alcohol  use: No    Alcohol/week: 0.0 standard drinks of alcohol   Drug use: No   Sexual activity: Not Currently    Birth control/protection: Surgical    Comment: 1st  intercourse 76 yo-Fewer than 5 partners  Other Topics Concern   Not on file  Social History Narrative   Married, no children.   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   No Tob.  No alc.  No drugs.   Orig from [place].   Social Determinants of Health   Financial Resource Strain: Low Risk  (04/28/2022)   Overall Financial Resource Strain (CARDIA)    Difficulty of Paying Living Expenses: Not hard at all  Food Insecurity: No Food Insecurity (04/28/2022)   Hunger Vital Sign    Worried About Running Out of Food in the Last Year: Never true    Ran Out of Food in the Last Year: Never true  Transportation Needs: No Transportation Needs (04/28/2022)   PRAPARE - Hydrologist (Medical): No    Lack of Transportation (Non-Medical): No  Physical Activity: Inactive (04/28/2022)   Exercise Vital Sign    Days of Exercise per Week: 0 days    Minutes of Exercise per Session: 0 min  Stress: Stress Concern Present (04/28/2022)   [place]    Feeling of Stress : To some extent  Social Connections: Moderately Integrated (04/28/2022)   Social Connection and Isolation Panel [NHANES]    Frequency of Communication with Friends and Family: More than three times a week    Frequency of Social Gatherings with Friends and Family: Twice a week    Attends Religious Services: More than 4 times per year    Active Member of [employer] or Organizations: No    Attends Music therapist: Never    Marital Status: Married    Tobacco Counseling Counseling given: Not Answered Tobacco comments: NEVER USED TOBACCO   Clinical Intake:  Pre-visit preparation completed: Yes  Pain : No/denies pain     BMI - recorded: 30.33 Nutritional Status: BMI > 30  Obese Nutritional Risks: None Diabetes: No  How often do you need to have someone help you when you read instructions, pamphlets, or other written materials  from your doctor or pharmacy?: 1 - Never  Diabetic?  Interpreter Needed?: No  Information entered by :: Charlott Rakes, LPN   Activities of Daily Living    04/28/2022    1:48 PM  In your present state of health, do you have any difficulty performing the following activities:  Hearing? 0  Vision? 0  Difficulty concentrating or making decisions? 0  Walking or climbing stairs? 1  Comment one at a time  Dressing or bathing? 0  Doing errands, shopping? 0  Preparing Food and eating ? N  Using the Toilet? N  In the past six months, have you accidently leaked urine? Y  Comment incontinence wears a pad  Do you have problems with loss of bowel control? N  Managing your Medications? N  Managing your Finances? N  Housekeeping or managing your Housekeeping? N    Patient Care Team: [name], MD as PCP - General (Family Medicine) Chesley Mires, MD as Consulting Physician (Pulmonary Disease) MacDiarmid, Nicki Reaper, MD as Consulting Physician (Urology) Bo Merino, MD as Consulting Physician (Rheumatology) [name], MD as Consulting Physician (Oncology) Renato Shin, MD (Inactive) as Consulting Physician (Endocrinology) Celso Amy, NP as Nurse Practitioner (Hematology and Oncology) Arrington,  Wille Glaser, OD as Referring Physician (Optometry) Lacretia Leigh, DMD (Dentistry) Deneise Lever, MD as Consulting Physician (Pulmonary Disease) Loletha Carrow Kirke Corin, MD as Consulting Physician (Gastroenterology) Margaretha Sheffield, MD as Consulting Physician (Physical Medicine and Rehabilitation) Earnie Larsson, MD as Consulting Physician (Neurosurgery)  Indicate any recent Medical Services you may have received from other than [facility] providers in the past year (date may be approximate).     Assessment:   This is a routine wellness examination for [name].  Hearing/Vision screen Hearing Screening - Comments:: Pt denies any hearing issues  Vision Screening - Comments:: Pt will follow up  with dr Nicki Reaper   Dietary issues and exercise activities discussed: Current Exercise Habits: The patient does not participate in regular exercise at present   Goals Addressed             This Visit's Progress    Patient Stated       Decrease Dr Malachi Bonds intake and lose weight        Depression Screen    04/28/2022    1:45 PM 03/03/2022    1:21 PM 12/30/2021    2:46 PM 12/08/2021   10:04 AM 10/05/2021   11:24 AM 04/22/2021    2:41 PM 04/21/2021   10:44 AM  PHQ 2/9 Scores  PHQ - 2 Score _0 0 3 3  PHQ- 9 Score _1 Fall Risk    04/28/2022    1:48 PM 07/22/2021    3:02 PM 04/22/2021    2:49 PM 09/08/2020   10:25 AM 01/25/2019    1:04 PM  Fall Risk   Falls in the past year? 0 1 0 1 1  Number falls in past yr: 0 0 0 0 1  Injury with Fall? 0 0 0 0 1  Follow up Falls prevention discussed Falls evaluation completed Falls evaluation completed;Falls prevention discussed Falls evaluation completed Falls evaluation completed    FALL RISK PREVENTION PERTAINING TO THE HOME:  Any stairs in or around the home? Yes  If so, are there any without handrails? No  Home free of loose throw rugs in walkways, pet beds, electrical cords, etc? Yes  Adequate lighting in your home to reduce risk of falls? Yes   ASSISTIVE DEVICES UTILIZED TO PREVENT FALLS:  Life alert? No  Use of a cane, walker or w/c? No  Grab bars in the bathroom? Yes  Shower chair or bench in shower? Yes  Elevated toilet seat or a handicapped toilet? Yes   TIMED UP AND GO:  Was the test performed? No .  Cognitive Function:        04/28/2022    1:52 PM  6CIT Screen  What Year? 0 points  What month? 0 points  What time? 0 points  Count back from 20 0 points  Months in reverse 0 points  Repeat phrase 2 points  Total Score 2 points    Immunizations Immunization History  Administered Date(s) Administered   Influenza Split 08/02/2011, 08/16/2012, 09/18/2013   Influenza Whole 07/23/2009, 07/29/2010    Influenza, High Dose Seasonal PF 09/10/2015, 08/02/2016   Influenza,inj,Quad PF,6+ Mos 06/19/2014   PFIZER(Purple Top)SARS-COV-2 Vaccination 11/18/2019, 12/18/2019   Pneumococcal Polysaccharide-23 07/24/2018   Pneumococcal-Unspecified 09/20/2009   Zoster, Live 06/21/2011    TDAP status: Due, Education has been provided regarding the importance of this vaccine. Advised may receive this vaccine at local pharmacy or Health Dept. Aware to provide a copy of  the vaccination record if obtained from local pharmacy or Health Dept. Verbalized acceptance and understanding.  Flu Vaccine status: Declined, Education has been provided regarding the importance of this vaccine but patient still declined. Advised may receive this vaccine at local pharmacy or Health Dept. Aware to provide a copy of the vaccination record if obtained from local pharmacy or Health Dept. Verbalized acceptance and understanding.  Pneumococcal vaccine status: Up to date  Covid-19 vaccine status: Completed vaccines  Qualifies for Shingles Vaccine? Yes   Zostavax completed No   Shingrix Completed?: No.    Education has been provided regarding the importance of this vaccine. Patient has been advised to call insurance company to determine out of pocket expense if they have not yet received this vaccine. Advised may also receive vaccine at local pharmacy or Health Dept. Verbalized acceptance and understanding.  Screening Tests Health Maintenance  Topic Date Due   Hepatitis C Screening  Never done   COVID-19 Vaccine (3 - Pfizer risk series) 01/15/2020   INFLUENZA VACCINE  04/20/2022   Zoster Vaccines- Shingrix (1 of 2) 07/29/2022 (Originally 12/02/1964)   Pneumonia Vaccine 81+ Years old (2 - PCV) 10/05/2022 (Originally 07/25/2019)   TETANUS/TDAP  10/05/2022 (Originally 04/23/2019)   DEXA SCAN  Completed   HPV VACCINES  Aged Out   COLONOSCOPY (Pts 45-37yr Insurance coverage will need to be confirmed)  Discontinued    Health  Maintenance  Health Maintenance Due  Topic Date Due   Hepatitis C Screening  Never done   COVID-19 Vaccine (3 - Pfizer risk series) 01/15/2020   INFLUENZA VACCINE  04/20/2022    Colorectal cancer screening: No longer required.   Mammogram status: Completed 11/12/21. Repeat every year  Bone Density status: Completed 11/12/21. Results reflect: Bone density results: OSTEOPENIA. Repeat every 2 years.    Additional Screening:  Hepatitis C Screening: does qualify;   Vision Screening: Recommended annual ophthalmology exams for early detection of glaucoma and other disorders of the eye. Is the patient up to date with their annual eye exam?  No  Who is the provider or what is the name of the office in which the patient attends annual eye exams? Encouraged follow up with dr SNicki Reaper If pt is not established with a provider, would they like to be referred to a provider to establish care? No .   Dental Screening: Recommended annual dental exams for proper oral hygiene  Community Resource Referral / Chronic Care Management: CRR required this visit?  No   CCM required this visit?  No      Plan:     I have personally reviewed and noted the following in the patient's chart:   Medical and social history Use of alcohol, tobacco or illicit drugs  Current medications and supplements including opioid prescriptions.  Functional ability and status Nutritional status Physical activity Advanced directives List of other physicians Hospitalizations, surgeries, and ER visits in previous 12 months Vitals Screenings to include cognitive, depression, and falls Referrals and appointments  In addition, I have reviewed and discussed with patient certain preventive protocols, quality metrics, and best practice recommendations. A written personalized care plan for preventive services as well as general preventive health recommendations were provided to patient.     TWillette Brace LPN   [id number]   Nurse Notes: None

## 2022-04-28 NOTE — Patient Instructions (Addendum)
[name] , Thank you for taking time to come for your Medicare Wellness Visit. I appreciate your ongoing commitment to your health goals. Please review the following plan we discussed and let me know if I can assist you in the future.   Screening recommendations/referrals: Colonoscopy: no longer required  Mammogram: done 11/12/21 repeat every year  Bone Density: done 11/12/21 repeat every 2 years  Recommended yearly ophthalmology/optometry visit for glaucoma screening and checkup Recommended yearly dental visit for hygiene and checkup  Vaccinations: Influenza vaccine: declined  Pneumococcal vaccine: Up to date Tdap vaccine: due  Shingles vaccine: Shingrix discussed. Please contact your pharmacy for coverage information.    Covid-19:completed 2/28, 12/18/19  Advanced directives: Advance directive discussed with you today. Even though you declined this today please call our office should you change your mind and we can give you the proper paperwork for you to fill out.  Conditions/risks identified: lose weight and decrease Dr Malachi Bonds   Next appointment: Follow up in one year for your annual wellness visit    Preventive Care 65 Years and Older, Female Preventive care refers to lifestyle choices and visits with your health care provider that can promote health and wellness. What does preventive care include? A yearly physical exam. This is also called an annual well check. Dental exams once or twice a year. Routine eye exams. Ask your health care provider how often you should have your eyes checked. Personal lifestyle choices, including: Daily care of your teeth and gums. Regular physical activity. Eating a healthy diet. Avoiding tobacco and drug use. Limiting alcohol use. Practicing safe sex. Taking low-dose aspirin every day. Taking vitamin and mineral supplements as recommended by your health care provider. What happens during an annual well check? The services and screenings  done by your health care provider during your annual well check will depend on your age, overall health, lifestyle risk factors, and family history of disease. Counseling  Your health care provider may ask you questions about your: Alcohol use. Tobacco use. Drug use. Emotional well-being. Home and relationship well-being. Sexual activity. Eating habits. History of falls. Memory and ability to understand (cognition). Work and work Statistician. Reproductive health. Screening  You may have the following tests or measurements: Height, weight, and BMI. Blood pressure. Lipid and cholesterol levels. These may be checked every 5 years, or more frequently if you are over 47 years old. Skin check. Lung cancer screening. You may have this screening every year starting at age 67 if you have a 30-pack-year history of smoking and currently smoke or have quit within the past 15 years. Fecal occult blood test (FOBT) of the stool. You may have this test every year starting at age 22. Flexible sigmoidoscopy or colonoscopy. You may have a sigmoidoscopy every 5 years or a colonoscopy every 10 years starting at age 74. Hepatitis C blood test. Hepatitis B blood test. Sexually transmitted disease (STD) testing. Diabetes screening. This is done by checking your blood sugar (glucose) after you have not eaten for a while (fasting). You may have this done every 1-3 years. Bone density scan. This is done to screen for osteoporosis. You may have this done starting at age 56. Mammogram. This may be done every 1-2 years. Talk to your health care provider about how often you should have regular mammograms. Talk with your health care provider about your test results, treatment options, and if necessary, the need for more tests. Vaccines  Your health care provider may recommend certain vaccines, such as:  Influenza vaccine. This is recommended every year. Tetanus, diphtheria, and acellular pertussis (Tdap, Td)  vaccine. You may need a Td booster every 10 years. Zoster vaccine. You may need this after age 82. Pneumococcal 13-valent conjugate (PCV13) vaccine. One dose is recommended after age 2. Pneumococcal polysaccharide (PPSV23) vaccine. One dose is recommended after age 25. Talk to your health care provider about which screenings and vaccines you need and how often you need them. This information is not intended to replace advice given to you by your health care provider. Make sure you discuss any questions you have with your health care provider. Document Released: 10/03/2015 Document Revised: 05/26/2016 Document Reviewed: 07/08/2015 Elsevier Interactive Patient Education  2017 [place] Prevention in the Home Falls can cause injuries. They can happen to people of all ages. There are many things you can do to make your home safe and to help prevent falls. What can I do on the outside of my home? Regularly fix the edges of walkways and driveways and fix any cracks. Remove anything that might make you trip as you walk through a door, such as a raised step or threshold. Trim any bushes or trees on the path to your home. Use bright outdoor lighting. Clear any walking paths of anything that might make someone trip, such as rocks or tools. Regularly check to see if handrails are loose or broken. Make sure that both sides of any steps have handrails. Any raised decks and porches should have guardrails on the edges. Have any leaves, snow, or ice cleared regularly. Use sand or salt on walking paths during winter. Clean up any spills in your garage right away. This includes oil or grease spills. What can I do in the bathroom? Use night lights. Install grab bars by the toilet and in the tub and shower. Do not use towel bars as grab bars. Use non-skid mats or decals in the tub or shower. If you need to sit down in the shower, use a plastic, non-slip stool. Keep the floor dry. Clean up any  water that spills on the floor as soon as it happens. Remove soap buildup in the tub or shower regularly. Attach bath mats securely with double-sided non-slip rug tape. Do not have throw rugs and other things on the floor that can make you trip. What can I do in the bedroom? Use night lights. Make sure that you have a light by your bed that is easy to reach. Do not use any sheets or blankets that are too big for your bed. They should not hang down onto the floor. Have a firm chair that has side arms. You can use this for support while you get dressed. Do not have throw rugs and other things on the floor that can make you trip. What can I do in the kitchen? Clean up any spills right away. Avoid walking on wet floors. Keep items that you use a lot in easy-to-reach places. If you need to reach something above you, use a strong step stool that has a grab bar. Keep electrical cords out of the way. Do not use floor polish or wax that makes floors slippery. If you must use wax, use non-skid floor wax. Do not have throw rugs and other things on the floor that can make you trip. What can I do with my stairs? Do not leave any items on the stairs. Make sure that there are handrails on both sides of the stairs and use them.  Fix handrails that are broken or loose. Make sure that handrails are as long as the stairways. Check any carpeting to make sure that it is firmly attached to the stairs. Fix any carpet that is loose or worn. Avoid having throw rugs at the top or bottom of the stairs. If you do have throw rugs, attach them to the floor with carpet tape. Make sure that you have a light switch at the top of the stairs and the bottom of the stairs. If you do not have them, ask someone to add them for you. What else can I do to help prevent falls? Wear shoes that: Do not have high heels. Have rubber bottoms. Are comfortable and fit you well. Are closed at the toe. Do not wear sandals. If you use a  stepladder: Make sure that it is fully opened. Do not climb a closed stepladder. Make sure that both sides of the stepladder are locked into place. Ask someone to hold it for you, if possible. Clearly mark and make sure that you can see: Any grab bars or handrails. First and last steps. Where the edge of each step is. Use tools that help you move around (mobility aids) if they are needed. These include: Canes. Walkers. Scooters. Crutches. Turn on the lights when you go into a dark area. Replace any light bulbs as soon as they burn out. Set up your furniture so you have a clear path. Avoid moving your furniture around. If any of your floors are uneven, fix them. If there are any pets around you, be aware of where they are. Review your medicines with your doctor. Some medicines can make you feel dizzy. This can increase your chance of falling. Ask your doctor what other things that you can do to help prevent falls. This information is not intended to replace advice given to you by your health care provider. Make sure you discuss any questions you have with your health care provider. Document Released: 07/03/2009 Document Revised: 02/12/2016 Document Reviewed: 10/11/2014 Elsevier Interactive Patient Education  2017 [employer].

## 2022-04-29 ENCOUNTER — Ambulatory Visit (INDEPENDENT_AMBULATORY_CARE_PROVIDER_SITE_OTHER): Payer: Medicare Other | Admitting: Family Medicine

## 2022-04-29 ENCOUNTER — Encounter: Payer: Self-pay | Admitting: Family Medicine

## 2022-04-29 VITALS — BP 116/76 | HR 74 | Temp 97.9°F | Ht 66.0 in | Wt 183.0 lb

## 2022-04-29 DIAGNOSIS — M1712 Unilateral primary osteoarthritis, left knee: Secondary | ICD-10-CM

## 2022-04-29 DIAGNOSIS — F411 Generalized anxiety disorder: Secondary | ICD-10-CM | POA: Diagnosis not present

## 2022-04-29 DIAGNOSIS — F331 Major depressive disorder, recurrent, moderate: Secondary | ICD-10-CM

## 2022-04-29 MED ORDER — VILAZODONE HCL 10 MG PO TABS
ORAL_TABLET | ORAL | 1 refills | Status: DC
Start: 2022-04-29 — End: 2022-05-27

## 2022-04-29 NOTE — Progress Notes (Signed)
OFFICE VISIT  04/29/2022  CC: f/u anx/dep Patient is a 76 y.o. female who presents for 1 month follow-up anxiety and depression. A/P as of last visit: "GAD, recurrent major depressive disorder. Increase Pristiq to 100 mg a day.   Chronic pain, multifactorial. Left knee severe DJD is most prominent--she is in the process of getting this further evaluated by orthopedics."  INTERIM HX: [name] still struggles a lot with her depression. When she went up to the 100 mg Pristiq dose she felt headache daily so she stopped taking it after a few days.   At a left knee steroid injection about a week ago at [facility] orthopedics. She thinks maybe this temporarily made her insomnia worse and anxiety and depression worse. She denies SI or HI.  We talked some about her worries about her siblings and husband. [place] takes a lot of responsibility for everyone, beats herself up constantly about everything in life. She has good insight into this but has found it very difficult to change.   Past Medical History:  Diagnosis Date   Allergic rhinitis    Anxiety    Chest pain    cr   Chronic gastritis    dx'd by EGD 2018 (erosive, likely NSAID-induced). 06/2019 +gastritis.   Chronic pain syndrome    right knee osteo, hx of TKA in R knee.  Pt was told by [name] that no further surgical options are available for her; also, chronic lower back pain.--[name].   Chronic renal insufficiency, stage II (mild) 2015   vs acute fluctuations depending on hydration?---GFR from 60s up to 80s.   Diverticulosis of colon    with hx of 'itis   DJD (degenerative joint disease)    L spine and knees.  R knee inject 03/15/18, L knee inject 12/26/18 (by Dr. Lynann Bologna).   Epigastric pain    As of 01/2017, GI (Dr. Loletha Carrow) planned EGD.   FATIGUE / MALAISE 07/01/2009   Chronic   GERD (gastroesophageal reflux disease)    History of colonic polyps    HTN (hypertension)    Hypercholesterolemia     recommended restart of simvastatin 07/2018   Hypothyroid 07/05/2012   IBS (irritable bowel syndrome)    IC (interstitial cystitis)    [name] at [facility]   Iron deficiency anemia, unspecified 06/27/2013   Has required IV iron on multiple occasions ([name]).  Hemoccults neg 12/2016. Most recent hem f/u labs normal 09/2020.   Lumbar back pain    Spinal stenosis, lumbar region with neurogenic claudication L5-S1 sx's--ESI's no help.  CT L spine stable post-surg appearance as of 12/2019   Major depression, recurrent ([place])    vs dysthymia,chronic   Memory impairment 10/03/2012   Memory impairment 10/03/2012 >referral to neuro     Monoclonal gammopathy of undetermined significance    IgM kappa MGUS ([name]) routine f/u has shown stability of this problem (most recent f/u 09/2020)   Neuromuscular disorder ([place])    OSA (obstructive sleep apnea)    Latest sleep study 01/2017.  Some adjustments to CPAP being made+ dental referral for consideration of oral appliance --per pulm MD.   Osteopenia 04/2014; 03/2018   2015: T score -2.0 FRAX 11%/1.8%.  2019 FRAX 10%/1.5%.  10/2021 T score -1.6 radius, -0.9 femur   Otitis externa, candida 09/7508   [name] tx'd her with clotrimazole 1% external solution x 1 mo   Palpitations    Psoriasis    Trochanteric bursitis of both hips    +  injections by ortho in the past   Vertigo 2020/21   6+ wks, ->to ENT->?vestib neuronitis with ongoing motion sensitivity, gradually resolving.([name] 01/28/24). MRI brain normal 09/2020.   Vitamin D deficiency    Xerostomia    and xerophthalmia--per [name] (he suggests SSA, SSB, ESR, rh factor, ANA, and antimicrosmal and antithyroglobulin ab's as of 02/17/16--all these labs were NORMAL.    Past Surgical History:  Procedure Laterality Date   ABDOMINAL HYSTERECTOMY     for prolapse with abdominal incision   BACK SURGERY  01/23/2015   Lumbar decompression with L2-L5 fusion ([name])   [place]     no PCI   CHOLECYSTECTOMY     COLONOSCOPY  04/2006;07/16/19   Diverticulosis, o/w normal.  06/2019->2 adenomas, +Diverticulosis.  Recall 2023-2025.   DEXA  04/06/2018   T score -1.5 femur neck, -2.2 radius.  FRAX 10%/1.5%.  10/2021 T score -1.6 radius, -0.9 femur. rpt 2 yrs   ESOPHAGOGASTRODUODENOSCOPY  05/29/2009; 03/01/17;07/16/19   Esoph normal.  Gastritis (H pylori NEG) + gastric polyps (benign).  Duodenal biopsy NORMAL.  2018-mild chronic gastritis, H pylori NEG. 06/2019 tortuous esophagus w/out stenosis +chronic gastritis H pyl neg.   EYE SURGERY Left    "lazy eye"   HAND SURGERY Right    TRIGGER FINGER; index finger   HERNIA REPAIR     umbilical   KNEE ARTHROSCOPY     RIGHT KNEE X 5   LAMINOTOMY  2010   L-4, L-5 FUSION   NASAL SINUS SURGERY     polypectomy (remote past)   OOPHORECTOMY  2010   Laparoscopic BSO (path benign)   PATELLA FRACTURE SURGERY  06/2007   [name]   POSTERIOR LAMINECTOMY / DECOMPRESSION LUMBAR SPINE  05/2008   [name]   TOTAL KNEE ARTHROPLASTY  12-07 and 4-07   [name], revision 12-09 [name] at [facility]    Outpatient Medications Prior to Visit  Medication Sig Dispense Refill   acetaminophen (TYLENOL) 325 MG tablet Take 650 mg by mouth every 6 (six) hours as needed.     ALPRAZolam (XANAX) 1 MG tablet TAKE 1 TO 2 TABLETS BY MOUTH TWICE DAILY AS NEEDED 120 tablet 5   Azelastine HCl (ASTEPRO) 0.15 % SOLN 2 sprays each nostril every 12 hours 30 mL 11   calcium carbonate (OSCAL) 1500 (600 Ca) MG TABS tablet Take by mouth daily.     Cholecalciferol (VITAMIN D3) 5000 units CAPS Take 4 capsules by mouth daily.      desvenlafaxine (PRISTIQ) 100 MG 24 hr tablet Take 1 tablet (100 mg total) by mouth daily. 30 tablet 1   diclofenac Sodium (VOLTAREN) 1 % GEL Apply 4 g topically 4 (four) times daily.     doxylamine, Sleep, (UNISOM) 25 MG tablet Take 25 mg by mouth at bedtime as needed.     hydrocortisone 2.5 % cream Apply 1 application  topically  as needed. RECTAL ITCHING     levothyroxine (SYNTHROID) 75 MCG tablet Take 1 tablet (75 mcg total) by mouth daily. 90 tablet 0   meclizine (ANTIVERT) 25 MG tablet TAKE 1 TABLET BY MOUTH THREE TIMES DAILY AS NEEDED FOR DIZZINESS 45 tablet 0   metoprolol tartrate (LOPRESSOR) 25 MG tablet Take 1 tablet (25 mg total) by mouth 2 (two) times daily. 180 tablet 3   Multiple Vitamins-Iron (MULTIVITAMIN/IRON PO) Take 1 tablet by mouth daily.     NIACIN PO Take by mouth daily.     omeprazole (PRILOSEC) 40 MG  capsule Take 1 capsule (40 mg total) by mouth daily. 90 capsule 3   Oxycodone HCl 10 MG TABS 10 mg every 4 (four) hours as needed.     polyethylene glycol (MIRALAX / GLYCOLAX) 17 g packet Take 17 g by mouth daily.     promethazine (PHENERGAN) 25 MG tablet TAKE 1 TABLET BY MOUTH EVERY 6 HOURS AS NEEDED FOR NAUSEA AND VOMITING 90 tablet 2   simvastatin (ZOCOR) 20 MG tablet Take 1 tablet (20 mg total) by mouth at bedtime. 90 tablet 1   Specialty Vitamins Products (MAGNESIUM, AMINO ACID CHELATE,) 133 MG tablet Take 1 tablet by mouth daily.     tolterodine (DETROL LA) 4 MG 24 hr capsule Take 1 capsule (4 mg total) by mouth daily. 90 capsule 1   cyclobenzaprine (FLEXERIL) 5 MG tablet Take 5 mg by mouth at bedtime. As needed (Patient not taking: Reported on 04/28/2022)     meloxicam (MOBIC) 7.5 MG tablet Take 2 tablets by mouth once daily (Patient not taking: Reported on 04/29/2022) 180 tablet 0   traZODone (DESYREL) 50 MG tablet Take 1 tablet by mouth at bedtime. (Patient not taking: Reported on 04/28/2022)  2   No facility-administered medications prior to visit.    Allergies  Allergen Reactions   Cymbalta [Duloxetine Hcl]     Facial edema   Iohexol      Code: HIVES, Desc: pt states her face and throat swells when administered IV contrast - KB, Onset Date: [id number]    Mepergan [Meperidine-Promethazine]     Cardiac arrest   Morphine Rash   Augmentin [Amoxicillin-Pot Clavulanate] Nausea Only   Citalopram  Other (See Comments)    Jittery/heart racing    Hctz [Hydrochlorothiazide] Other (See Comments)    Too much urinating, felt dried out, etc--NO ALLERGY   Lexapro [Escitalopram Oxalate] Other (See Comments)    Jittery,heart racing   Lyrica [Pregabalin] Swelling    Face, hands, fingers, ankles   Promethazine Hcl    Valsartan     REACTION: pt states INTOL to [place]    ROS As per HPI  PE:    04/29/2022    1:08 PM 03/31/2022    1:14 PM 03/03/2022    1:02 PM  Vitals with BMI  Height _0  _1  _2   Weight 183 lbs 185 lbs 10 oz 187 lbs 13 oz  BMI 29.55 18.28 83.37  Systolic 445 146 047  Diastolic 76 75 70  Pulse 74 71 56     Physical Exam  Gen: Alert, well appearing.  Patient is oriented to person, place, time, and situation. AFFECT: pleasant, lucid thought and speech. No further exam today.  LABS:  Last CBC Lab Results  Component Value Date   WBC 7.7 11/10/2021   HGB 14.6 11/10/2021   HCT 43.8 11/10/2021   MCV 86.4 11/10/2021   MCH 28.8 11/10/2021   RDW 12.8 11/10/2021   PLT 219 99/87/2158   Last metabolic panel Lab Results  Component Value Date   GLUCOSE 120 (H) 11/10/2021   NA 139 11/10/2021   K 4.0 11/10/2021   CL 103 11/10/2021   CO2 28 11/10/2021   BUN 17 11/10/2021   CREATININE 1.00 11/10/2021   GFRNONAA 59 (L) 11/10/2021   CALCIUM 8.9 11/10/2021   PROT 7.2 11/10/2021   ALBUMIN 4.0 11/10/2021   LABGLOB 3.3 11/10/2021   AGRATIO 1.1 11/10/2021   BILITOT 0.6 11/10/2021   ALKPHOS 97 11/10/2021   AST 17 11/10/2021   ALT 17  11/10/2021   ANIONGAP 8 11/10/2021   IMPRESSION AND PLAN:  #1 recurrent major depressive disorder/dysthymia, GAD. Emotional support/empathic listening today.  She has tried and either failed or had adverse side effects from many antidepressants over the years.  The only ones listed on her allergies list are Cymbalta, citalopram, and Lexapro.  Now most recently intolerant to [place]. Will go ahead and try Viibryd 10 mg a  day.  #2 chronic left knee pain, severe DJD. Steroid injection recently helped for 1 week but pain is starting to return significantly. At this point she is hesitant to make any further steps towards orthopedics reevaluation.  An After Visit Summary was printed and given to the patient.  FOLLOW UP: No follow-ups on file.  Signed:  Crissie Sickles, MD           04/29/2022

## 2022-05-02 ENCOUNTER — Emergency Department (HOSPITAL_BASED_OUTPATIENT_CLINIC_OR_DEPARTMENT_OTHER): Payer: Medicare Other | Admitting: Radiology

## 2022-05-02 ENCOUNTER — Encounter (HOSPITAL_BASED_OUTPATIENT_CLINIC_OR_DEPARTMENT_OTHER): Payer: Self-pay

## 2022-05-02 ENCOUNTER — Other Ambulatory Visit: Payer: Self-pay

## 2022-05-02 ENCOUNTER — Emergency Department (HOSPITAL_BASED_OUTPATIENT_CLINIC_OR_DEPARTMENT_OTHER)
Admission: EM | Admit: 2022-05-02 | Discharge: 2022-05-02 | Disposition: A | Discharge status: Home / Self Care | Payer: Medicare Other | Attending: Emergency Medicine | Admitting: Emergency Medicine

## 2022-05-02 DIAGNOSIS — S82891A Other fracture of right lower leg, initial encounter for closed fracture: Secondary | ICD-10-CM | POA: Insufficient documentation

## 2022-05-02 DIAGNOSIS — S2241XA Multiple fractures of ribs, right side, initial encounter for closed fracture: Secondary | ICD-10-CM | POA: Insufficient documentation

## 2022-05-02 DIAGNOSIS — W01198A Fall on same level from slipping, tripping and stumbling with subsequent striking against other object, initial encounter: Secondary | ICD-10-CM | POA: Insufficient documentation

## 2022-05-02 DIAGNOSIS — Z79899 Other long term (current) drug therapy: Secondary | ICD-10-CM | POA: Insufficient documentation

## 2022-05-02 DIAGNOSIS — W19XXXA Unspecified fall, initial encounter: Secondary | ICD-10-CM

## 2022-05-02 DIAGNOSIS — S8991XA Unspecified injury of right lower leg, initial encounter: Secondary | ICD-10-CM | POA: Diagnosis present

## 2022-05-02 DIAGNOSIS — M25512 Pain in left shoulder: Secondary | ICD-10-CM | POA: Diagnosis not present

## 2022-05-02 NOTE — ED Notes (Signed)
Discharge paperwork given and verbally understood. 

## 2022-05-02 NOTE — ED Provider Notes (Signed)
[facility] EMERGENCY DEPT Provider Note   CSN: [id number] Arrival date & time: 05/02/22  1238     History  Chief Complaint  Patient presents with   Lytle Michaels    [name] is a 76 y.o. female.  HPI 76 yo female states at home today and called by sibling that her other sister is going to hospice today, and she went to hang phone up and got feet tangled.  She hit the couch arm with her right ribs and shoulder.  Did not stike head.  Fell on right lower chest and rolled to floor and hit left shoulder. Able to get up off floor. Pain is most in right lower rib cage.  Patient goes to pain clinic and takes 10 mg of oxycodone 4 x/day.  Ho left knee replacement and ongoing knee pain on right.  Took pain medicine here.      Home Medications Prior to Admission medications   Medication Sig Start Date End Date Taking? Authorizing Provider  acetaminophen (TYLENOL) 325 MG tablet Take 650 mg by mouth every 6 (six) hours as needed.    [provider]  ALPRAZolam Duanne Moron) 1 MG tablet TAKE 1 TO 2 TABLETS BY MOUTH TWICE DAILY AS NEEDED 03/31/22   McGowen, Adrian Blackwater, MD  Azelastine HCl (ASTEPRO) 0.15 % SOLN 2 sprays each nostril every 12 hours 07/19/14   McGowen, Adrian Blackwater, MD  calcium carbonate (OSCAL) 1500 (600 Ca) MG TABS tablet Take by mouth daily.    [provider]  Cholecalciferol (VITAMIN D3) 5000 units CAPS Take 4 capsules by mouth daily.     [provider]  cyclobenzaprine (FLEXERIL) 5 MG tablet Take 5 mg by mouth at bedtime. As needed Patient not taking: Reported on 04/28/2022 12/01/20   [provider]  diclofenac Sodium (VOLTAREN) 1 % GEL Apply 4 g topically 4 (four) times daily. 09/03/20   [provider]  doxylamine, Sleep, (UNISOM) 25 MG tablet Take 25 mg by mouth at bedtime as needed.    [provider]  hydrocortisone 2.5 % cream Apply 1 application  topically as needed. RECTAL ITCHING 09/08/12   [provider]   levothyroxine (SYNTHROID) 75 MCG tablet Take 1 tablet (75 mcg total) by mouth daily. 03/31/22   Elayne Snare, MD  meclizine (ANTIVERT) 25 MG tablet TAKE 1 TABLET BY MOUTH THREE TIMES DAILY AS NEEDED FOR DIZZINESS 12/31/21   McGowen, Adrian Blackwater, MD  meloxicam (MOBIC) 7.5 MG tablet Take 2 tablets by mouth once daily Patient not taking: Reported on 04/29/2022 07/15/20   McGowen, Adrian Blackwater, MD  metoprolol tartrate (LOPRESSOR) 25 MG tablet Take 1 tablet (25 mg total) by mouth 2 (two) times daily. 03/18/21   McGowen, Adrian Blackwater, MD  Multiple Vitamins-Iron (MULTIVITAMIN/IRON PO) Take 1 tablet by mouth daily.    [provider]  NIACIN PO Take by mouth daily.    [provider]  omeprazole (PRILOSEC) 40 MG capsule Take 1 capsule (40 mg total) by mouth daily. 11/25/21   McGowen, Adrian Blackwater, MD  Oxycodone HCl 10 MG TABS 10 mg every 4 (four) hours as needed. 12/01/15   [provider]  polyethylene glycol (MIRALAX / GLYCOLAX) 17 g packet Take 17 g by mouth daily.    [provider]  promethazine (PHENERGAN) 25 MG tablet TAKE 1 TABLET BY MOUTH EVERY 6 HOURS AS NEEDED FOR NAUSEA AND VOMITING 02/05/22   McGowen, Adrian Blackwater, MD  simvastatin (ZOCOR) 20 MG tablet Take 1 tablet (20  mg total) by mouth at bedtime. 03/31/22   McGowen, Adrian Blackwater, MD  Specialty Vitamins Products (MAGNESIUM, AMINO ACID CHELATE,) 133 MG tablet Take 1 tablet by mouth daily.    [provider]  tolterodine (DETROL LA) 4 MG 24 hr capsule Take 1 capsule (4 mg total) by mouth daily. 03/03/22   McGowen, Adrian Blackwater, MD  traZODone (DESYREL) 50 MG tablet Take 1 tablet by mouth at bedtime. Patient not taking: Reported on 04/28/2022 01/15/18   [provider]  Vilazodone HCl (VIIBRYD) 10 MG TABS 1 tab po qd 04/29/22   McGowen, Adrian Blackwater, MD      Allergies    Cymbalta [duloxetine hcl], Iohexol, Mepergan [meperidine-promethazine], Morphine, Augmentin [amoxicillin-pot clavulanate], Citalopram, Hctz [hydrochlorothiazide],  Lexapro [escitalopram oxalate], Lyrica [pregabalin], Promethazine hcl, and Valsartan    Review of Systems   Review of Systems  Physical Exam Updated Vital Signs BP (!) 150/74 (BP Location: Right Arm)   Pulse (!) 58   Temp 98.5 F (36.9 C) (Oral)   Resp 16   Ht 1.676 m ('5\' 6"'$ )   Wt 83 kg   SpO2 97%   BMI 29.53 kg/m  Physical Exam  ED Results / Procedures / Treatments   Labs (all labs ordered are listed, but only abnormal results are displayed) Labs Reviewed - No data to display  EKG None  Radiology DG Shoulder Left  Result Date: 05/02/2022 CLINICAL DATA:  Fall onto chair today.  Left shoulder pain. EXAM: LEFT SHOULDER - 2+ VIEW COMPARISON:  None Available. FINDINGS: There is no evidence of fracture or dislocation. Mild degenerative spurring is seen involving the inferior glenohumeral joint. Soft tissues are unremarkable. IMPRESSION: No acute findings. Mild glenohumeral degenerative spurring. Electronically Signed   By: Marlaine Hind M.D.   On: 05/02/2022 14:00   DG Ribs Unilateral W/Chest Right  Result Date: 05/02/2022 CLINICAL DATA:  Fall onto chair this morning. Right rib and chest pain. EXAM: RIGHT RIBS AND CHEST - 3+ VIEW COMPARISON:  Chest radiograph on 06/26/2018 FINDINGS: Mildly displaced fractures are seen involving the right anterolateral 6th, 7th, and 8th ribs. There is no evidence of pneumothorax or pleural effusion. Both lungs are clear. Heart size and mediastinal contours are within normal limits. IMPRESSION: Mildly displaced fractures involving the right anterolateral 6th, 7th, and 8th ribs. No evidence of pneumothorax or hemothorax. Electronically Signed   By: Marlaine Hind M.D.   On: 05/02/2022 13:59    Procedures Procedures    Medications Ordered in ED Medications - No data to display  ED Course/ Medical Decision Making/ A&P                           Medical Decision Making 76 yo female with mechanical fall with injury to right ribs.  No head injury and no  blood thinners. Patient with multiple right rib fxs Patient oxygenating well She is on chronic pain medicine She is given spirometer Discussed need for movement and deep breaths. Discussed return precautions. F/U with Dr. Caleen Jobs   Amount and/or Complexity of Data Reviewed Radiology: ordered and independent interpretation performed. Decision-making details documented in ED Course.           Final Clinical Impression(s) / ED Diagnoses Final diagnoses:  Fall, initial encounter  Multiple closed fractures of right lower extremity and ribs, initial encounter    Rx / DC Orders ED Discharge Orders     None  Pattricia Boss, MD 05/02/22 [id number]

## 2022-05-02 NOTE — ED Triage Notes (Signed)
Patient here POV from Home.  Endorses Falling today after her Feet became "Tangled". Fell onto her Chair approximately at 0900 this AM. Endorses Pain to Right Chest and Left Shoulder.  No Head Injury. No LOC. No Anticoagulants.   NAD Noted during Triage. A&Ox4. GCS 15. BIB Wheelchair.

## 2022-05-06 DIAGNOSIS — M961 Postlaminectomy syndrome, not elsewhere classified: Secondary | ICD-10-CM | POA: Diagnosis not present

## 2022-05-06 DIAGNOSIS — M1712 Unilateral primary osteoarthritis, left knee: Secondary | ICD-10-CM | POA: Diagnosis not present

## 2022-05-06 DIAGNOSIS — K5900 Constipation, unspecified: Secondary | ICD-10-CM | POA: Diagnosis not present

## 2022-05-06 DIAGNOSIS — G894 Chronic pain syndrome: Secondary | ICD-10-CM | POA: Diagnosis not present

## 2022-05-06 DIAGNOSIS — Z79891 Long term (current) use of opiate analgesic: Secondary | ICD-10-CM | POA: Diagnosis not present

## 2022-05-10 ENCOUNTER — Emergency Department (HOSPITAL_BASED_OUTPATIENT_CLINIC_OR_DEPARTMENT_OTHER): Payer: Medicare Other

## 2022-05-10 ENCOUNTER — Other Ambulatory Visit: Payer: Self-pay

## 2022-05-10 ENCOUNTER — Emergency Department (HOSPITAL_BASED_OUTPATIENT_CLINIC_OR_DEPARTMENT_OTHER)
Admission: EM | Admit: 2022-05-10 | Discharge: 2022-05-10 | Disposition: A | Discharge status: Home / Self Care | Payer: Medicare Other | Attending: Emergency Medicine | Admitting: Emergency Medicine

## 2022-05-10 ENCOUNTER — Encounter (HOSPITAL_BASED_OUTPATIENT_CLINIC_OR_DEPARTMENT_OTHER): Payer: Self-pay | Admitting: *Deleted

## 2022-05-10 ENCOUNTER — Ambulatory Visit: Payer: Medicare Other | Admitting: Hematology & Oncology

## 2022-05-10 ENCOUNTER — Inpatient Hospital Stay: Payer: Medicare Other

## 2022-05-10 DIAGNOSIS — R059 Cough, unspecified: Secondary | ICD-10-CM | POA: Diagnosis not present

## 2022-05-10 DIAGNOSIS — S2241XD Multiple fractures of ribs, right side, subsequent encounter for fracture with routine healing: Secondary | ICD-10-CM | POA: Diagnosis not present

## 2022-05-10 DIAGNOSIS — W19XXXD Unspecified fall, subsequent encounter: Secondary | ICD-10-CM | POA: Insufficient documentation

## 2022-05-10 DIAGNOSIS — R079 Chest pain, unspecified: Secondary | ICD-10-CM | POA: Diagnosis not present

## 2022-05-10 DIAGNOSIS — R0781 Pleurodynia: Secondary | ICD-10-CM | POA: Insufficient documentation

## 2022-05-10 MED ORDER — KETOROLAC TROMETHAMINE 60 MG/2ML IM SOLN
15.0000 mg | Freq: Once | INTRAMUSCULAR | Status: AC
  Administered 2022-05-10: 15 mg via INTRAMUSCULAR
  Filled 2022-05-10: qty 2

## 2022-05-10 MED ORDER — NAPROXEN 375 MG PO TABS: 375.0000 mg | ORAL_TABLET | Freq: Two times a day (BID) | ORAL | 0 refills | Status: DC

## 2022-05-10 NOTE — ED Notes (Signed)
Patient transported to X-ray 

## 2022-05-10 NOTE — ED Triage Notes (Signed)
Pt fell last Sunday 8/13 and was seen at [facility] ED and found to have rib and clavicle fracture.  She was d/c and told to follow up with PCP.  Her PCP was on vacation and she felt that her pain was not controlled despite pain medication (she is in pain management and she got no relief from oxycodone).

## 2022-05-10 NOTE — ED Provider Notes (Signed)
[place] EMERGENCY DEPARTMENT  Provider Note  CSN: [id number] Arrival date & time: 05/10/22 0057  History Chief Complaint  Patient presents with   Pain    [name] is a 76 y.o. female with history of chronic pain, under pain management taking Oxycodone/APAP 10/325 x 180/month had a fall about a week ago. Seen at the ED and had xrays showing right anterolateral rib fractures x 3 (she was under the impression she had 5 rib fractures and a clavicle fracture but no mention of that in notes or Rad reports). She has had persistent pain and now cough without fever despite taking her meds and using her incentive spirometer.    Home Medications Prior to Admission medications   Medication Sig Start Date End Date Taking? Authorizing Provider  naproxen (NAPROSYN) 375 MG tablet Take 1 tablet (375 mg total) by mouth 2 (two) times daily with a meal for 7 days. 05/10/22 05/17/22 Yes Truddie Hidden, MD  acetaminophen (TYLENOL) 325 MG tablet Take 650 mg by mouth every 6 (six) hours as needed.    [provider]  ALPRAZolam Duanne Moron) 1 MG tablet TAKE 1 TO 2 TABLETS BY MOUTH TWICE DAILY AS NEEDED 03/31/22   McGowen, Adrian Blackwater, MD  Azelastine HCl (ASTEPRO) 0.15 % SOLN 2 sprays each nostril every 12 hours 07/19/14   McGowen, Adrian Blackwater, MD  calcium carbonate (OSCAL) 1500 (600 Ca) MG TABS tablet Take by mouth daily.    [provider]  Cholecalciferol (VITAMIN D3) 5000 units CAPS Take 4 capsules by mouth daily.     [provider]  cyclobenzaprine (FLEXERIL) 5 MG tablet Take 5 mg by mouth at bedtime. As needed Patient not taking: Reported on 04/28/2022 12/01/20   [provider]  diclofenac Sodium (VOLTAREN) 1 % GEL Apply 4 g topically 4 (four) times daily. 09/03/20   [provider]  doxylamine, Sleep, (UNISOM) 25 MG tablet Take 25 mg by mouth at bedtime as needed.    [provider]  hydrocortisone 2.5 % cream Apply 1 application  topically  as needed. RECTAL ITCHING 09/08/12   [provider]  levothyroxine (SYNTHROID) 75 MCG tablet Take 1 tablet (75 mcg total) by mouth daily. 03/31/22   Elayne Snare, MD  meclizine (ANTIVERT) 25 MG tablet TAKE 1 TABLET BY MOUTH THREE TIMES DAILY AS NEEDED FOR DIZZINESS 12/31/21   McGowen, Adrian Blackwater, MD  metoprolol tartrate (LOPRESSOR) 25 MG tablet Take 1 tablet (25 mg total) by mouth 2 (two) times daily. 03/18/21   McGowen, Adrian Blackwater, MD  Multiple Vitamins-Iron (MULTIVITAMIN/IRON PO) Take 1 tablet by mouth daily.    [provider]  NIACIN PO Take by mouth daily.    [provider]  omeprazole (PRILOSEC) 40 MG capsule Take 1 capsule (40 mg total) by mouth daily. 11/25/21   McGowen, Adrian Blackwater, MD  Oxycodone HCl 10 MG TABS 10 mg every 4 (four) hours as needed. 12/01/15   [provider]  polyethylene glycol (MIRALAX / GLYCOLAX) 17 g packet Take 17 g by mouth daily.    [provider]  promethazine (PHENERGAN) 25 MG tablet TAKE 1 TABLET BY MOUTH EVERY 6 HOURS AS NEEDED FOR NAUSEA AND VOMITING 02/05/22   McGowen, Adrian Blackwater, MD  simvastatin (ZOCOR) 20 MG tablet Take 1 tablet (20 mg total) by mouth at bedtime. 03/31/22   McGowen, Adrian Blackwater, MD  Specialty Vitamins Products (MAGNESIUM, AMINO ACID CHELATE,) 133 MG tablet Take 1 tablet by mouth daily.  [provider]  tolterodine (DETROL LA) 4 MG 24 hr capsule Take 1 capsule (4 mg total) by mouth daily. 03/03/22   McGowen, Adrian Blackwater, MD  traZODone (DESYREL) 50 MG tablet Take 1 tablet by mouth at bedtime. Patient not taking: Reported on 04/28/2022 01/15/18   [provider]  Vilazodone HCl (VIIBRYD) 10 MG TABS 1 tab po qd 04/29/22   McGowen, Adrian Blackwater, MD     Allergies    Cymbalta [duloxetine hcl], Iohexol, Mepergan [meperidine-promethazine], Morphine, Augmentin [amoxicillin-pot clavulanate], Citalopram, Hctz [hydrochlorothiazide], Lexapro [escitalopram oxalate], Lyrica [pregabalin], Promethazine hcl, and  Valsartan   Review of Systems   Review of Systems Please see HPI for pertinent positives and negatives  Physical Exam BP (!) 147/99 (BP Location: Right Arm)   Pulse 66   Temp 98 F (36.7 C) (Oral)   Resp 16   SpO2 96%   Physical Exam Vitals and nursing note reviewed.  Constitutional:      Appearance: Normal appearance.  HENT:     Head: Normocephalic and atraumatic.     Nose: Nose normal.     Mouth/Throat:     Mouth: Mucous membranes are moist.  Eyes:     Extraocular Movements: Extraocular movements intact.     Conjunctiva/sclera: Conjunctivae normal.  Cardiovascular:     Rate and Rhythm: Normal rate.  Pulmonary:     Effort: Pulmonary effort is normal.     Breath sounds: Rhonchi present. No wheezing or rales.  Chest:     Chest wall: Tenderness (R lateral chest wall) present.  Abdominal:     General: Abdomen is flat.     Palpations: Abdomen is soft.     Tenderness: There is no abdominal tenderness.  Musculoskeletal:        General: No swelling. Normal range of motion.     Cervical back: Neck supple.  Skin:    General: Skin is warm and dry.  Neurological:     General: No focal deficit present.     Mental Status: She is alert.  Psychiatric:        Mood and Affect: Mood normal.     ED Results / Procedures / Treatments   EKG None  Procedures Procedures  Medications Ordered in the ED Medications  ketorolac (TORADOL) injection 15 mg (15 mg Intramuscular Given 05/10/22 0143)    Initial Impression and Plan  Patient here with continued pain after rib fractures. Will give IM toradol as an adjunct to her already large doses of oxycodone ('60mg'$  per day). Check xray to eval developing pneumonia or PTX.   ED Course   Clinical Course as of 05/10/22 0255  Mon May 10, 2022  0248 I personally viewed the images from radiology studies and agree with radiologist interpretation: CXR without concerning findings.   [CS]  [id number] Patient reports pain is improved. Will add  Naproxyn to her pain regimen short term. Advised to call her Pain Management clinic in the AM to discuss other modalities such as nerve blocks that they may offer.  [CS]    Clinical Course User Index [CS] Truddie Hidden, MD     MDM Rules/Calculators/A&P Medical Decision Making Problems Addressed: Closed fracture of multiple ribs of right side with routine healing, subsequent encounter: acute illness or injury  Amount and/or Complexity of Data Reviewed Radiology: ordered and independent interpretation performed. Decision-making details documented in ED Course.  Risk Prescription drug management.    Final Clinical Impression(s) / ED Diagnoses Final diagnoses:  Closed fracture of multiple  ribs of right side with routine healing, subsequent encounter    Rx / DC Orders ED Discharge Orders          Ordered    naproxen (NAPROSYN) 375 MG tablet  2 times daily with meals        05/10/22 0255             Truddie Hidden, MD 05/10/22 [phone]

## 2022-05-11 ENCOUNTER — Ambulatory Visit (INDEPENDENT_AMBULATORY_CARE_PROVIDER_SITE_OTHER): Payer: Medicare Other | Admitting: Family Medicine

## 2022-05-11 ENCOUNTER — Encounter: Payer: Self-pay | Admitting: Family Medicine

## 2022-05-11 VITALS — BP 103/69 | HR 61 | Temp 98.7°F | Wt 181.4 lb

## 2022-05-11 DIAGNOSIS — Z9181 History of falling: Secondary | ICD-10-CM | POA: Diagnosis not present

## 2022-05-11 DIAGNOSIS — R2681 Unsteadiness on feet: Secondary | ICD-10-CM

## 2022-05-11 DIAGNOSIS — S2241XD Multiple fractures of ribs, right side, subsequent encounter for fracture with routine healing: Secondary | ICD-10-CM | POA: Diagnosis not present

## 2022-05-11 NOTE — Progress Notes (Signed)
OFFICE VISIT  05/11/2022  CC:  Chief Complaint  Patient presents with   Rib Injury    Pt is not fasting. Right rib area shoulder has not been hurting.   Patient is a 76 y.o. female who presents for ED f/u for fall with injury.  HPI: Patient had fall from standing on 05/02/2022 when she got her feet tangled when walking in her home and fell forward onto her chest on the hard arm of couch.  No dizziness or presyncopal feeling prior to her fall.  She has chronic mild unsteady gait due to chronic arthritis and associated stiffness and debilitation. She went to the emergency department on the day of the incident and x-ray showed mildly displaced fractures involving the right anterolateral 6th, 7th, and 8th ribs.   She returned to the emergency department yesterday with ongoing severe pain and recent onset of cough without fever. Chest x-ray did not show any concern for cardiopulmonary problems. Short-term naproxen was added to her chronic oxycodone for pain.  CURRENTLY: Right ribs area still hurting quite a bit in ribs. No pain in the right clavicle or shoulder unless she tries to lift it up high. Right breast hurts but she does not feel any hematoma anywhere.  She has 2 small bruises in the right lateral chest wall. She is doing incentive spirometry well. No cough, no fever, no shortness of breath.  She has not filled the naproxen prescription prescribed by the ED yet.  Past Medical History:  Diagnosis Date   Allergic rhinitis    Anxiety    Chest pain    cr   Chronic gastritis    dx'd by EGD 2018 (erosive, likely NSAID-induced). 06/2019 +gastritis.   Chronic pain syndrome    right knee osteo, hx of TKA in R knee.  Pt was told by [name] that no further surgical options are available for her; also, chronic lower back pain.--[name].   Chronic renal insufficiency, stage II (mild) 2015   vs acute fluctuations depending on hydration?---GFR from 60s up to 80s.    Diverticulosis of colon    with hx of 'itis   DJD (degenerative joint disease)    L spine and knees.  R knee inject 03/15/18, L knee inject 12/26/18 (by Dr. Lynann Bologna).   Epigastric pain    As of 01/2017, GI (Dr. Loletha Carrow) planned EGD.   FATIGUE / MALAISE 07/01/2009   Chronic   GERD (gastroesophageal reflux disease)    History of colonic polyps    HTN (hypertension)    Hypercholesterolemia    recommended restart of simvastatin 07/2018   Hypothyroid 07/05/2012   IBS (irritable bowel syndrome)    IC (interstitial cystitis)    [name] at [facility]   Iron deficiency anemia, unspecified 06/27/2013   Has required IV iron on multiple occasions ([name]).  Hemoccults neg 12/2016. Most recent hem f/u labs normal 09/2020.   Lumbar back pain    Spinal stenosis, lumbar region with neurogenic claudication L5-S1 sx's--ESI's no help.  CT L spine stable post-surg appearance as of 12/2019   Major depression, recurrent ([place])    vs dysthymia,chronic   Memory impairment 10/03/2012   Memory impairment 10/03/2012 >referral to neuro     Monoclonal gammopathy of undetermined significance    IgM kappa MGUS ([name]) routine f/u has shown stability of this problem (most recent f/u 09/2020)   Neuromuscular disorder ([place])    OSA (obstructive sleep apnea)    Latest sleep study 01/2017.  Some adjustments to CPAP being made+ dental referral for consideration of oral appliance --per pulm MD.   Osteopenia 04/2014; 03/2018   2015: T score -2.0 FRAX 11%/1.8%.  2019 FRAX 10%/1.5%.  10/2021 T score -1.6 radius, -0.9 femur   Otitis externa, candida 06/1750   [name] tx'd her with clotrimazole 1% external solution x 1 mo   Palpitations    Psoriasis    Trochanteric bursitis of both hips    + injections by ortho in the past   Vertigo 2020/21   6+ wks, ->to ENT->?vestib neuronitis with ongoing motion sensitivity, gradually resolving.([name] 0/25/85). MRI brain normal 09/2020.   Vitamin D deficiency    Xerostomia     and xerophthalmia--per [name] (he suggests SSA, SSB, ESR, rh factor, ANA, and antimicrosmal and antithyroglobulin ab's as of 02/17/16--all these labs were NORMAL.    Past Surgical History:  Procedure Laterality Date   ABDOMINAL HYSTERECTOMY     for prolapse with abdominal incision   BACK SURGERY  01/23/2015   Lumbar decompression with L2-L5 fusion ([name])   [place]     no PCI   CHOLECYSTECTOMY     COLONOSCOPY  04/2006;07/16/19   Diverticulosis, o/w normal.  06/2019->2 adenomas, +Diverticulosis.  Recall 2023-2025.   DEXA  04/06/2018   T score -1.5 femur neck, -2.2 radius.  FRAX 10%/1.5%.  10/2021 T score -1.6 radius, -0.9 femur. rpt 2 yrs   ESOPHAGOGASTRODUODENOSCOPY  05/29/2009; 03/01/17;07/16/19   Esoph normal.  Gastritis (H pylori NEG) + gastric polyps (benign).  Duodenal biopsy NORMAL.  2018-mild chronic gastritis, H pylori NEG. 06/2019 tortuous esophagus w/out stenosis +chronic gastritis H pyl neg.   EYE SURGERY Left    "lazy eye"   HAND SURGERY Right    TRIGGER FINGER; index finger   HERNIA REPAIR     umbilical   KNEE ARTHROSCOPY     RIGHT KNEE X 5   LAMINOTOMY  2010   L-4, L-5 FUSION   NASAL SINUS SURGERY     polypectomy (remote past)   OOPHORECTOMY  2010   Laparoscopic BSO (path benign)   PATELLA FRACTURE SURGERY  06/2007   [name]   POSTERIOR LAMINECTOMY / DECOMPRESSION LUMBAR SPINE  05/2008   [name]   TOTAL KNEE ARTHROPLASTY  12-07 and 4-07   [name], revision 12-09 [name] at [facility]    Outpatient Medications Prior to Visit  Medication Sig Dispense Refill   acetaminophen (TYLENOL) 325 MG tablet Take 650 mg by mouth every 6 (six) hours as needed.     ALPRAZolam (XANAX) 1 MG tablet TAKE 1 TO 2 TABLETS BY MOUTH TWICE DAILY AS NEEDED 120 tablet 5   Azelastine HCl (ASTEPRO) 0.15 % SOLN 2 sprays each nostril every 12 hours 30 mL 11   calcium carbonate (OSCAL) 1500 (600 Ca) MG TABS tablet Take by mouth daily.     Cholecalciferol (VITAMIN D3)  5000 units CAPS Take 4 capsules by mouth daily.      cyclobenzaprine (FLEXERIL) 5 MG tablet Take 5 mg by mouth at bedtime. As needed     diclofenac Sodium (VOLTAREN) 1 % GEL Apply 4 g topically 4 (four) times daily.     doxylamine, Sleep, (UNISOM) 25 MG tablet Take 25 mg by mouth at bedtime as needed.     hydrocortisone 2.5 % cream Apply 1 application  topically as needed. RECTAL ITCHING     levothyroxine (SYNTHROID) 75 MCG tablet Take 1 tablet (75 mcg total) by mouth daily. [place]  tablet 0   meclizine (ANTIVERT) 25 MG tablet TAKE 1 TABLET BY MOUTH THREE TIMES DAILY AS NEEDED FOR DIZZINESS 45 tablet 0   metoprolol tartrate (LOPRESSOR) 25 MG tablet Take 1 tablet (25 mg total) by mouth 2 (two) times daily. 180 tablet 3   Multiple Vitamins-Iron (MULTIVITAMIN/IRON PO) Take 1 tablet by mouth daily.     naproxen (NAPROSYN) 375 MG tablet Take 1 tablet (375 mg total) by mouth 2 (two) times daily with a meal for 7 days. 14 tablet 0   NIACIN PO Take by mouth daily.     omeprazole (PRILOSEC) 40 MG capsule Take 1 capsule (40 mg total) by mouth daily. 90 capsule 3   Oxycodone HCl 10 MG TABS 10 mg every 4 (four) hours as needed.     polyethylene glycol (MIRALAX / GLYCOLAX) 17 g packet Take 17 g by mouth daily.     promethazine (PHENERGAN) 25 MG tablet TAKE 1 TABLET BY MOUTH EVERY 6 HOURS AS NEEDED FOR NAUSEA AND VOMITING 90 tablet 2   simvastatin (ZOCOR) 20 MG tablet Take 1 tablet (20 mg total) by mouth at bedtime. 90 tablet 1   Specialty Vitamins Products (MAGNESIUM, AMINO ACID CHELATE,) 133 MG tablet Take 1 tablet by mouth daily.     tolterodine (DETROL LA) 4 MG 24 hr capsule Take 1 capsule (4 mg total) by mouth daily. 90 capsule 1   traZODone (DESYREL) 50 MG tablet Take 1 tablet by mouth at bedtime.  2   Vilazodone HCl (VIIBRYD) 10 MG TABS 1 tab po qd 30 tablet 1   No facility-administered medications prior to visit.    Allergies  Allergen Reactions   Cymbalta [Duloxetine Hcl]     Facial edema   Iohexol       Code: HIVES, Desc: pt states her face and throat swells when administered IV contrast - KB, Onset Date: 91916606    Mepergan [Meperidine-Promethazine]     Cardiac arrest   Morphine Rash   Augmentin [Amoxicillin-Pot Clavulanate] Nausea Only   Citalopram Other (See Comments)    Jittery/heart racing    Hctz [Hydrochlorothiazide] Other (See Comments)    Too much urinating, felt dried out, etc--NO ALLERGY   Lexapro [Escitalopram Oxalate] Other (See Comments)    Jittery,heart racing   Lyrica [Pregabalin] Swelling    Face, hands, fingers, ankles   Promethazine Hcl    Valsartan     REACTION: pt states INTOL to Diovan    ROS As per HPI  PE:    05/11/2022   11:24 AM 05/10/2022    1:04 AM 05/02/2022    3:15 PM  Vitals with BMI  Weight 181 lbs 6 oz    BMI 00.45    Systolic 997 741 423  Diastolic 69 99 96  Pulse 61 66 66     Physical Exam  General: Alert, chronically tired appearing but no acute distress. Lungs clear to auscultation bilaterally, breath sounds equal, breathing nonlabored. Right chest wall tender to palpation diffusely but more intensely in the right posterolateral mid ribs region. She walks with a slow gait, no veering, no shuffling.  LABS:  Last CBC Lab Results  Component Value Date   WBC 7.7 11/10/2021   HGB 14.6 11/10/2021   HCT 43.8 11/10/2021   MCV 86.4 11/10/2021   MCH 28.8 11/10/2021   RDW 12.8 11/10/2021   PLT 219 95/32/0233   Last metabolic panel Lab Results  Component Value Date   GLUCOSE 120 (H) 11/10/2021   NA 139  11/10/2021   K 4.0 11/10/2021   CL 103 11/10/2021   CO2 28 11/10/2021   BUN 17 11/10/2021   CREATININE 1.00 11/10/2021   GFRNONAA 59 (L) 11/10/2021   CALCIUM 8.9 11/10/2021   PROT 7.2 11/10/2021   ALBUMIN 4.0 11/10/2021   LABGLOB 3.3 11/10/2021   AGRATIO 1.1 11/10/2021   BILITOT 0.6 11/10/2021   ALKPHOS 97 11/10/2021   AST 17 11/10/2021   ALT 17 11/10/2021   ANIONGAP 8 11/10/2021   IMPRESSION AND PLAN:  #1  recent fall, sustained 3 mildly displaced rib fractures.  No signs of complication at this time. Discussed that normally rib fracture pain is intense and should gradually dissipate but could last up to 6 weeks. Encouraged her to fill the naproxen prescription prescribed by the ED. She has a pain contract for chronic pain, takes oxycodone.  She has not changed her dosing of this.  #2 gait instability.  This is chronic. Recommended physical therapy and she was agreeable to this.  She wants to wait until the biggest part of her pain is improved before starting this though.  An After Visit Summary was printed and given to the patient.  FOLLOW UP: No follow-ups on file.  Signed:  Crissie Sickles, MD           05/11/2022

## 2022-05-12 ENCOUNTER — Inpatient Hospital Stay: Payer: Medicare Other

## 2022-05-12 ENCOUNTER — Other Ambulatory Visit: Payer: Self-pay | Admitting: Family Medicine

## 2022-05-12 ENCOUNTER — Inpatient Hospital Stay: Payer: Medicare Other | Admitting: Hematology & Oncology

## 2022-05-12 MED ORDER — NAPROXEN 375 MG PO TABS: 375.0000 mg | ORAL_TABLET | Freq: Two times a day (BID) | ORAL | 0 refills | Status: AC

## 2022-05-12 NOTE — Telephone Encounter (Signed)
Patient stated he contact [facility] but they did not have Naproxen for patient. Husband want to know if Naproxen can be sent to   [facility] [address] [place], [place]

## 2022-05-12 NOTE — Addendum Note (Signed)
Addended by: [name] on: 05/12/2022 05:26 PM   Modules accepted: Orders

## 2022-05-12 NOTE — Telephone Encounter (Signed)
Please review and advise. Medication pending

## 2022-05-12 NOTE — Telephone Encounter (Signed)
Okay, prescription sent to [facility]

## 2022-05-12 NOTE — Addendum Note (Signed)
Addended by: Deveron Furlong D on: 05/12/2022 03:08 PM   Modules accepted: Orders

## 2022-05-12 NOTE — Telephone Encounter (Signed)
Pt husband called stating he was not sure what pharmacy patient Naproxen was sent to. Husband was informed medication is at [facility] to be picked up. Medication was sent to pharmacy at hospital discharge.

## 2022-05-13 NOTE — Telephone Encounter (Signed)
LM for pt regarding medication ?

## 2022-05-15 ENCOUNTER — Other Ambulatory Visit: Payer: Self-pay | Admitting: Endocrinology

## 2022-05-15 DIAGNOSIS — E039 Hypothyroidism, unspecified: Secondary | ICD-10-CM

## 2022-05-17 ENCOUNTER — Other Ambulatory Visit: Payer: Medicare Other

## 2022-05-18 ENCOUNTER — Ambulatory Visit: Payer: Medicare Other | Admitting: Endocrinology

## 2022-05-20 ENCOUNTER — Telehealth: Payer: Self-pay

## 2022-05-20 NOTE — Telephone Encounter (Signed)
PA denied for Vilazodone HCL '10mg'$ . Patient must meet the following critieria:  - has had prior therapy, or cannot use one of the following: a generic bupropion product ('75mg'$ /'100mg'$  IR, '100mg'$ /'150mg'$ /'200mg'$  SR, or '150mg'$ /'300mg'$  XL) or mirtazapine.   Please review and advise. This medication was prescribed during 8/10 visit

## 2022-05-20 NOTE — Telephone Encounter (Signed)
She has not tolerated any of the medications that you listed.

## 2022-05-25 NOTE — Telephone Encounter (Signed)
Will have to submit new PA or do appeal.

## 2022-05-26 DIAGNOSIS — K5900 Constipation, unspecified: Secondary | ICD-10-CM | POA: Diagnosis not present

## 2022-05-26 DIAGNOSIS — G894 Chronic pain syndrome: Secondary | ICD-10-CM | POA: Diagnosis not present

## 2022-05-26 DIAGNOSIS — M1712 Unilateral primary osteoarthritis, left knee: Secondary | ICD-10-CM | POA: Diagnosis not present

## 2022-05-26 DIAGNOSIS — M961 Postlaminectomy syndrome, not elsewhere classified: Secondary | ICD-10-CM | POA: Diagnosis not present

## 2022-05-27 ENCOUNTER — Encounter: Payer: Self-pay | Admitting: Family Medicine

## 2022-05-27 ENCOUNTER — Ambulatory Visit (INDEPENDENT_AMBULATORY_CARE_PROVIDER_SITE_OTHER): Payer: Medicare Other | Admitting: Family Medicine

## 2022-05-27 VITALS — BP 116/78 | HR 68 | Temp 97.7°F | Ht 66.0 in | Wt 184.8 lb

## 2022-05-27 DIAGNOSIS — S20211D Contusion of right front wall of thorax, subsequent encounter: Secondary | ICD-10-CM

## 2022-05-27 DIAGNOSIS — S2241XD Multiple fractures of ribs, right side, subsequent encounter for fracture with routine healing: Secondary | ICD-10-CM

## 2022-05-27 DIAGNOSIS — R0789 Other chest pain: Secondary | ICD-10-CM | POA: Diagnosis not present

## 2022-05-27 MED ORDER — LIDOCAINE 5 % EX PTCH: 3.0000 | MEDICATED_PATCH | CUTANEOUS | 1 refills | Status: DC

## 2022-05-27 NOTE — Patient Instructions (Signed)
Research an alternative treatment for chronic knee pain-->Genicular nerve block or ablation.

## 2022-05-27 NOTE — Progress Notes (Signed)
OFFICE VISIT  05/27/2022  CC:  Chief Complaint  Patient presents with   Depression    Follow up   Patient is a 76 y.o. female who presents accompanied by her husband [name] for 1 mo f/u depression and 2-week follow-up right chest wall pain due to rib fractures. A/P as of last visit: "#1 recurrent major depressive disorder/dysthymia, GAD. Emotional support/empathic listening today.  She has tried and either failed or had adverse side effects from many antidepressants over the years.  The only ones listed on her allergies list are Cymbalta, citalopram, and Lexapro.  Now most recently intolerant to [place]. Will go ahead and try Viibryd 10 mg a day.   #2 chronic left knee pain, severe DJD. Steroid injection recently helped for 1 week but pain is starting to return significantly. At this point she is hesitant to make any further steps towards orthopedics reevaluation."  INTERIM HX: Sustained rib fractures in a fall 2 weeks ago. Naproxen prescribed by EDP.  She took this as prescribed. Says her pain is overall about the same.  Denies shortness of breath or fever. The pain is located in the entire anterolateral right chest wall occasionally extends around to the right mid back region. Has to sleep on her back. She was doing incentive spirometry for a while but not in the last week or so.   Past Medical History:  Diagnosis Date   Allergic rhinitis    Anxiety    Chest pain    cr   Chronic gastritis    dx'd by EGD 2018 (erosive, likely NSAID-induced). 06/2019 +gastritis.   Chronic pain syndrome    right knee osteo, hx of TKA in R knee.  Pt was told by [name] that no further surgical options are available for her; also, chronic lower back pain.--[name].   Chronic renal insufficiency, stage II (mild) 2015   vs acute fluctuations depending on hydration?---GFR from 60s up to 80s.   Diverticulosis of colon    with hx of 'itis   DJD (degenerative joint disease)    L  spine and knees.  R knee inject 03/15/18, L knee inject 12/26/18 (by Dr. Lynann Bologna).   Epigastric pain    As of 01/2017, GI (Dr. Loletha Carrow) planned EGD.   FATIGUE / MALAISE 07/01/2009   Chronic   GERD (gastroesophageal reflux disease)    History of colonic polyps    HTN (hypertension)    Hypercholesterolemia    recommended restart of simvastatin 07/2018   Hypothyroid 07/05/2012   IBS (irritable bowel syndrome)    IC (interstitial cystitis)    [name] at [facility]   Iron deficiency anemia, unspecified 06/27/2013   Has required IV iron on multiple occasions ([name]).  Hemoccults neg 12/2016. Most recent hem f/u labs normal 09/2020.   Lumbar back pain    Spinal stenosis, lumbar region with neurogenic claudication L5-S1 sx's--ESI's no help.  CT L spine stable post-surg appearance as of 12/2019   Major depression, recurrent ([place])    vs dysthymia,chronic   Memory impairment 10/03/2012   Memory impairment 10/03/2012 >referral to neuro     Monoclonal gammopathy of undetermined significance    IgM kappa MGUS ([name]) routine f/u has shown stability of this problem (most recent f/u 09/2020)   Neuromuscular disorder ([place])    OSA (obstructive sleep apnea)    Latest sleep study 01/2017.  Some adjustments to CPAP being made+ dental referral for consideration of oral appliance --per pulm MD.  Osteopenia 04/2014; 03/2018   2015: T score -2.0 FRAX 11%/1.8%.  2019 FRAX 10%/1.5%.  10/2021 T score -1.6 radius, -0.9 femur   Otitis externa, candida 93/7169   [name] tx'd her with clotrimazole 1% external solution x 1 mo   Palpitations    Psoriasis    Trochanteric bursitis of both hips    + injections by ortho in the past   Vertigo 2020/21   6+ wks, ->to ENT->?vestib neuronitis with ongoing motion sensitivity, gradually resolving.([name] 6/78/93). MRI brain normal 09/2020.   Vitamin D deficiency    Xerostomia    and xerophthalmia--per [name] (he suggests SSA, SSB, ESR, rh factor, ANA, and  antimicrosmal and antithyroglobulin ab's as of 02/17/16--all these labs were NORMAL.    Past Surgical History:  Procedure Laterality Date   ABDOMINAL HYSTERECTOMY     for prolapse with abdominal incision   BACK SURGERY  01/23/2015   Lumbar decompression with L2-L5 fusion ([name])   [place]     no PCI   CHOLECYSTECTOMY     COLONOSCOPY  04/2006;07/16/19   Diverticulosis, o/w normal.  06/2019->2 adenomas, +Diverticulosis.  Recall 2023-2025.   DEXA  04/06/2018   T score -1.5 femur neck, -2.2 radius.  FRAX 10%/1.5%.  10/2021 T score -1.6 radius, -0.9 femur. rpt 2 yrs   ESOPHAGOGASTRODUODENOSCOPY  05/29/2009; 03/01/17;07/16/19   Esoph normal.  Gastritis (H pylori NEG) + gastric polyps (benign).  Duodenal biopsy NORMAL.  2018-mild chronic gastritis, H pylori NEG. 06/2019 tortuous esophagus w/out stenosis +chronic gastritis H pyl neg.   EYE SURGERY Left    "lazy eye"   HAND SURGERY Right    TRIGGER FINGER; index finger   HERNIA REPAIR     umbilical   KNEE ARTHROSCOPY     RIGHT KNEE X 5   LAMINOTOMY  2010   L-4, L-5 FUSION   NASAL SINUS SURGERY     polypectomy (remote past)   OOPHORECTOMY  2010   Laparoscopic BSO (path benign)   PATELLA FRACTURE SURGERY  06/2007   [name]   POSTERIOR LAMINECTOMY / DECOMPRESSION LUMBAR SPINE  05/2008   [name]   TOTAL KNEE ARTHROPLASTY  12-07 and 4-07   [name], revision 12-09 [name] at [facility]    Outpatient Medications Prior to Visit  Medication Sig Dispense Refill   acetaminophen (TYLENOL) 325 MG tablet Take 650 mg by mouth every 6 (six) hours as needed.     ALPRAZolam (XANAX) 1 MG tablet TAKE 1 TO 2 TABLETS BY MOUTH TWICE DAILY AS NEEDED 120 tablet 5   calcium carbonate (OSCAL) 1500 (600 Ca) MG TABS tablet Take by mouth daily.     Cholecalciferol (VITAMIN D3) 5000 units CAPS Take 4 capsules by mouth daily.      diclofenac Sodium (VOLTAREN) 1 % GEL Apply 4 g topically 4 (four) times daily.     doxylamine, Sleep, (UNISOM) 25 MG  tablet Take 25 mg by mouth at bedtime as needed.     hydrocortisone 2.5 % cream Apply 1 application  topically as needed. RECTAL ITCHING     levothyroxine (SYNTHROID) 75 MCG tablet Take 1 tablet (75 mcg total) by mouth daily. 90 tablet 0   meclizine (ANTIVERT) 25 MG tablet TAKE 1 TABLET BY MOUTH THREE TIMES DAILY AS NEEDED FOR DIZZINESS 45 tablet 0   metoprolol tartrate (LOPRESSOR) 25 MG tablet Take 1 tablet (25 mg total) by mouth 2 (two) times daily. 180 tablet 3   Multiple Vitamins-Iron (MULTIVITAMIN/IRON PO) Take  1 tablet by mouth daily.     NIACIN PO Take by mouth daily.     omeprazole (PRILOSEC) 40 MG capsule Take 1 capsule (40 mg total) by mouth daily. 90 capsule 3   Oxycodone HCl 10 MG TABS 10 mg every 4 (four) hours as needed.     polyethylene glycol (MIRALAX / GLYCOLAX) 17 g packet Take 17 g by mouth daily.     promethazine (PHENERGAN) 25 MG tablet TAKE 1 TABLET BY MOUTH EVERY 6 HOURS AS NEEDED FOR NAUSEA AND VOMITING 90 tablet 2   simvastatin (ZOCOR) 20 MG tablet Take 1 tablet (20 mg total) by mouth at bedtime. 90 tablet 1   Specialty Vitamins Products (MAGNESIUM, AMINO ACID CHELATE,) 133 MG tablet Take 1 tablet by mouth daily.     tolterodine (DETROL LA) 4 MG 24 hr capsule Take 1 capsule (4 mg total) by mouth daily. 90 capsule 1   traZODone (DESYREL) 50 MG tablet Take 1 tablet by mouth at bedtime.  2   Azelastine HCl (ASTEPRO) 0.15 % SOLN 2 sprays each nostril every 12 hours (Patient not taking: Reported on 05/27/2022) 30 mL 11   cyclobenzaprine (FLEXERIL) 5 MG tablet Take 5 mg by mouth at bedtime. As needed (Patient not taking: Reported on 05/27/2022)     Vilazodone HCl (VIIBRYD) 10 MG TABS 1 tab po qd (Patient not taking: Reported on 05/27/2022) 30 tablet 1   No facility-administered medications prior to visit.    Allergies  Allergen Reactions   Cymbalta [Duloxetine Hcl]     Facial edema   Iohexol      Code: HIVES, Desc: pt states her face and throat swells when administered IV  contrast - KB, Onset Date: [id number]    Mepergan [Meperidine-Promethazine]     Cardiac arrest   Morphine Rash   Augmentin [Amoxicillin-Pot Clavulanate] Nausea Only   Citalopram Other (See Comments)    Jittery/heart racing    Hctz [Hydrochlorothiazide] Other (See Comments)    Too much urinating, felt dried out, etc--NO ALLERGY   Lexapro [Escitalopram Oxalate] Other (See Comments)    Jittery,heart racing   Lyrica [Pregabalin] Swelling    Face, hands, fingers, ankles   Promethazine Hcl    Valsartan     REACTION: pt states INTOL to Diovan    ROS As per HPI  PE:    05/27/2022    1:34 PM 05/11/2022   11:24 AM 05/10/2022    1:04 AM  Vitals with BMI  Height _0     Weight 184 lbs 13 oz 181 lbs 6 oz   BMI 25.85 27.78   Systolic 242 353 614  Diastolic 78 69 99  Pulse 68 61 66     Physical Exam  Gen: Alert, well appearing.  Patient is oriented to person, place, time, and situation. AFFECT: Pleasant, lucid thought and speech. Right anterolateral chest wall tender diffusely. No crepitus or deformity is palpable.  Lungs are clear to auscultation bilaterally in all areas.  Breathing is nonlabored.  Cardiovascular exam shows regular rhythm and rate without murmur.  LABS:  None  IMPRESSION AND PLAN:  #1 right anterolateral sixth seventh and eighth rib fractures. Pain unchanged over the last 2 weeks. We will try lidocaine patches 5%, 3 patches to the affected area once a day. I think physical therapy would be beneficial so I have ordered this today. Reminded patient to continue incentive spirometry. If not improved at follow-up in 1 month then we will do follow-up rib x-rays to check  for appropriate healing.  #2 recurrent major depressive disorder/dysthymia. Has not tolerated many antidepressant trials. At this time we will continue to observe.  She repeatedly has denied any suicidal or homicidal ideation.  An After Visit Summary was printed and given to the patient.  FOLLOW  UP: Return in about 4 weeks (around 06/24/2022) for f/u chest wall pain/rib fractures.  Signed:  Crissie Sickles, MD           05/27/2022

## 2022-06-15 ENCOUNTER — Other Ambulatory Visit: Payer: Self-pay

## 2022-06-15 MED ORDER — SULFAMETHOXAZOLE-TRIMETHOPRIM 800-160 MG PO TABS: 1.0000 | ORAL_TABLET | Freq: Two times a day (BID) | ORAL | 0 refills | Status: DC

## 2022-06-15 NOTE — Telephone Encounter (Signed)
Patient was last seen for UTI on 02/03/22, prescribed Bactrim DS 1 tab po bid.  Symptoms are frequent urination and burning starting yesterday. Denies odor, cloudy, hematuria, fever, body aches or chills. Patient understands this is not a guarantee for medication.  Please review and advise

## 2022-06-15 NOTE — Telephone Encounter (Signed)
Patient called to request refill on antibiotic for urinary tract infection.  Patient does not remember name of med. She stated Dr. [name] called in antibiotic within last month.  I told patient he would need to test urine before refilling meds.  She declined coming into office. She said there was just no way she could come into office with everything going on at home.   Annasofia stated she will just have to wait until her appt next week with Dr. [name]  Please advise [phone]

## 2022-06-15 NOTE — Telephone Encounter (Signed)
Okay, Bactrim prescription sent

## 2022-06-16 ENCOUNTER — Other Ambulatory Visit: Payer: Self-pay | Admitting: Family Medicine

## 2022-06-21 ENCOUNTER — Emergency Department (HOSPITAL_BASED_OUTPATIENT_CLINIC_OR_DEPARTMENT_OTHER): Payer: Medicare Other

## 2022-06-21 ENCOUNTER — Encounter (HOSPITAL_BASED_OUTPATIENT_CLINIC_OR_DEPARTMENT_OTHER): Payer: Self-pay | Admitting: Emergency Medicine

## 2022-06-21 ENCOUNTER — Other Ambulatory Visit: Payer: Self-pay

## 2022-06-21 ENCOUNTER — Emergency Department (HOSPITAL_BASED_OUTPATIENT_CLINIC_OR_DEPARTMENT_OTHER)
Admission: EM | Admit: 2022-06-21 | Discharge: 2022-06-21 | Disposition: A | Discharge status: Home / Self Care | Payer: Medicare Other | Attending: Emergency Medicine | Admitting: Emergency Medicine

## 2022-06-21 DIAGNOSIS — Z20822 Contact with and (suspected) exposure to covid-19: Secondary | ICD-10-CM | POA: Insufficient documentation

## 2022-06-21 DIAGNOSIS — J209 Acute bronchitis, unspecified: Secondary | ICD-10-CM | POA: Insufficient documentation

## 2022-06-21 DIAGNOSIS — R059 Cough, unspecified: Secondary | ICD-10-CM | POA: Diagnosis present

## 2022-06-21 LAB — RESP PANEL BY RT-PCR (FLU A&B, COVID) ARPGX2
Influenza A by PCR: NEGATIVE
Influenza B by PCR: NEGATIVE
SARS Coronavirus 2 by RT PCR: NEGATIVE

## 2022-06-21 LAB — CBC WITH DIFFERENTIAL/PLATELET
Abs Immature Granulocytes: 0.02 10*3/uL (ref 0.00–0.07)
Basophils Absolute: 0 10*3/uL (ref 0.0–0.1)
Basophils Relative: 0 %
Eosinophils Absolute: 0 10*3/uL (ref 0.0–0.5)
Eosinophils Relative: 0 %
HCT: 45.2 % (ref 36.0–46.0)
Hemoglobin: 15.3 g/dL — ABNORMAL HIGH (ref 12.0–15.0)
Immature Granulocytes: 0 %
Lymphocytes Relative: 15 %
Lymphs Abs: 1.4 10*3/uL (ref 0.7–4.0)
MCH: 28.6 pg (ref 26.0–34.0)
MCHC: 33.8 g/dL (ref 30.0–36.0)
MCV: 84.5 fL (ref 80.0–100.0)
Monocytes Absolute: 0.3 10*3/uL (ref 0.1–1.0)
Monocytes Relative: 3 %
Neutro Abs: 7.3 10*3/uL (ref 1.7–7.7)
Neutrophils Relative %: 82 %
Platelets: 223 10*3/uL (ref 150–400)
RBC: 5.35 MIL/uL — ABNORMAL HIGH (ref 3.87–5.11)
RDW: 12.5 % (ref 11.5–15.5)
WBC: 9 10*3/uL (ref 4.0–10.5)
nRBC: 0 % (ref 0.0–0.2)

## 2022-06-21 LAB — BASIC METABOLIC PANEL
Anion gap: 11 (ref 5–15)
BUN: 24 mg/dL — ABNORMAL HIGH (ref 8–23)
CO2: 23 mmol/L (ref 22–32)
Calcium: 9.5 mg/dL (ref 8.9–10.3)
Chloride: 105 mmol/L (ref 98–111)
Creatinine, Ser: 1.09 mg/dL — ABNORMAL HIGH (ref 0.44–1.00)
GFR, Estimated: 53 mL/min — ABNORMAL LOW (ref >60)
Glucose, Bld: 130 mg/dL — ABNORMAL HIGH (ref 70–99)
Potassium: 4 mmol/L (ref 3.5–5.1)
Sodium: 139 mmol/L (ref 135–145)

## 2022-06-21 LAB — GROUP A STREP BY PCR: Group A Strep by PCR: NOT DETECTED

## 2022-06-21 MED ORDER — SODIUM CHLORIDE 0.9 % IV BOLUS
500.0000 mL | Freq: Once | INTRAVENOUS | Status: AC
  Administered 2022-06-21: 500 mL via INTRAVENOUS

## 2022-06-21 MED ORDER — BENZONATATE 100 MG PO CAPS: 100.0000 mg | ORAL_CAPSULE | Freq: Three times a day (TID) | ORAL | 0 refills | Status: DC

## 2022-06-21 MED ORDER — METOPROLOL TARTRATE 25 MG PO TABS
25.0000 mg | ORAL_TABLET | Freq: Once | ORAL | Status: AC
  Administered 2022-06-21: 25 mg via ORAL
  Filled 2022-06-21: qty 1

## 2022-06-21 MED ORDER — AZITHROMYCIN 250 MG PO TABS: 250.0000 mg | ORAL_TABLET | Freq: Every day | ORAL | 0 refills | Status: DC

## 2022-06-21 NOTE — Discharge Instructions (Signed)
You were seen in the emergency department for cough sore throat.  Your COVID flu and strep test were negative and your chest x-ray did not show any pneumonia.  We cover you with some antibiotics for possible bronchitis.  Please drink plenty of fluids and rest.  Follow-up with your primary care doctor.  Return to the emergency department if any worsening or concerning symptoms

## 2022-06-21 NOTE — ED Provider Notes (Signed)
[facility] DEPARTMENT Provider Note   CSN: [id number] Arrival date & time: 06/21/22  7673     History  Chief Complaint  Patient presents with   Sore Throat   Generalized Body Aches    [name] is a 76 y.o. female.  She is complaining of being sick for the last 4 days.  Nonproductive cough malaise sore throat lips burning.  No fevers.  No nausea vomiting diarrhea.  She is a non-smoker.  No sick contacts or recent travel.  She did a home COVID test that was negative.  She said she forgot to take her blood pressure medicine this morning and would like a tablet.  The history is provided by the patient.  Cough Cough characteristics:  Non-productive Sputum characteristics:  Nondescript Severity:  Moderate Onset quality:  Gradual Duration:  4 days Timing:  Intermittent Progression:  Unchanged Chronicity:  New Smoker: no   Context: not sick contacts   Relieved by:  Nothing Worsened by:  Nothing Ineffective treatments:  None tried Associated symptoms: myalgias, shortness of breath and sore throat   Associated symptoms: no chest pain, no ear pain, no fever and no rash   Risk factors: no recent infection and no recent travel        Home Medications Prior to Admission medications   Medication Sig Start Date End Date Taking? Authorizing Provider  acetaminophen (TYLENOL) 325 MG tablet Take 650 mg by mouth every 6 (six) hours as needed.    [provider]  ALPRAZolam Duanne Moron) 1 MG tablet TAKE 1 TO 2 TABLETS BY MOUTH TWICE DAILY AS NEEDED 03/31/22   McGowen, Adrian Blackwater, MD  Azelastine HCl (ASTEPRO) 0.15 % SOLN 2 sprays each nostril every 12 hours Patient not taking: Reported on 05/27/2022 07/19/14   [name], MD  calcium carbonate (OSCAL) 1500 (600 Ca) MG TABS tablet Take by mouth daily.    [provider]  Cholecalciferol (VITAMIN D3) 5000 units CAPS Take 4 capsules by mouth daily.     [provider]  cyclobenzaprine  (FLEXERIL) 5 MG tablet Take 5 mg by mouth at bedtime. As needed Patient not taking: Reported on 05/27/2022 12/01/20   [provider]  diclofenac Sodium (VOLTAREN) 1 % GEL Apply 4 g topically 4 (four) times daily. 09/03/20   [provider]  doxylamine, Sleep, (UNISOM) 25 MG tablet Take 25 mg by mouth at bedtime as needed.    [provider]  hydrocortisone 2.5 % cream Apply 1 application  topically as needed. RECTAL ITCHING 09/08/12   [provider]  levothyroxine (SYNTHROID) 75 MCG tablet Take 1 tablet (75 mcg total) by mouth daily. 03/31/22   Elayne Snare, MD  lidocaine (LIDODERM) 5 % Place 3 patches onto the skin daily. Remove & Discard patch within 12 hours. 05/27/22   McGowen, Adrian Blackwater, MD  meclizine (ANTIVERT) 25 MG tablet TAKE 1 TABLET BY MOUTH THREE TIMES DAILY AS NEEDED FOR DIZZINESS 12/31/21   McGowen, Adrian Blackwater, MD  metoprolol tartrate (LOPRESSOR) 25 MG tablet Take 1 tablet by mouth twice daily 06/17/22   McGowen, Adrian Blackwater, MD  Multiple Vitamins-Iron (MULTIVITAMIN/IRON PO) Take 1 tablet by mouth daily.    [provider]  NIACIN PO Take by mouth daily.    [provider]  omeprazole (PRILOSEC) 40 MG capsule Take 1 capsule (40 mg total) by mouth daily. 11/25/21   McGowen, Adrian Blackwater, MD  Oxycodone HCl 10 MG TABS 10 mg every 4 (four) hours  as needed. 12/01/15   [provider]  polyethylene glycol (MIRALAX / GLYCOLAX) 17 g packet Take 17 g by mouth daily.    [provider]  promethazine (PHENERGAN) 25 MG tablet TAKE 1 TABLET BY MOUTH EVERY 6 HOURS AS NEEDED FOR NAUSEA AND VOMITING 02/05/22   McGowen, Adrian Blackwater, MD  simvastatin (ZOCOR) 20 MG tablet Take 1 tablet (20 mg total) by mouth at bedtime. 03/31/22   McGowen, Adrian Blackwater, MD  Specialty Vitamins Products (MAGNESIUM, AMINO ACID CHELATE,) 133 MG tablet Take 1 tablet by mouth daily.    [provider]  sulfamethoxazole-trimethoprim (BACTRIM DS) 800-160 MG tablet Take 1 tablet  by mouth 2 (two) times daily. 06/15/22   McGowen, Adrian Blackwater, MD  tolterodine (DETROL LA) 4 MG 24 hr capsule Take 1 capsule (4 mg total) by mouth daily. 03/03/22   McGowen, Adrian Blackwater, MD  traZODone (DESYREL) 50 MG tablet Take 1 tablet by mouth at bedtime. 01/15/18   [provider]      Allergies    Cymbalta [duloxetine hcl], Iohexol, Mepergan [meperidine-promethazine], Morphine, Augmentin [amoxicillin-pot clavulanate], Citalopram, Hctz [hydrochlorothiazide], Lexapro [escitalopram oxalate], Lyrica [pregabalin], Promethazine hcl, and Valsartan    Review of Systems   Review of Systems  Constitutional:  Negative for fever.  HENT:  Positive for sore throat. Negative for ear pain.   Respiratory:  Positive for cough and shortness of breath.   Cardiovascular:  Negative for chest pain.  Gastrointestinal:  Positive for constipation. Negative for diarrhea, nausea and vomiting.  Genitourinary:  Negative for dysuria.  Musculoskeletal:  Positive for myalgias.  Skin:  Negative for rash.    Physical Exam Updated Vital Signs BP (!) 174/77 (BP Location: Right Arm)   Pulse 81   Temp 98.2 F (36.8 C) (Oral)   Resp 18   Wt 83.8 kg   SpO2 97%   BMI 29.82 kg/m  Physical Exam Vitals and nursing note reviewed.  Constitutional:      General: She is not in acute distress.    Appearance: Normal appearance. She is well-developed.  HENT:     Head: Normocephalic and atraumatic.     Mouth/Throat:     Mouth: Mucous membranes are moist.     Pharynx: Posterior oropharyngeal erythema present. No oropharyngeal exudate.     Tonsils: No tonsillar exudate or tonsillar abscesses.  Eyes:     Conjunctiva/sclera: Conjunctivae normal.  Cardiovascular:     Rate and Rhythm: Normal rate and regular rhythm.     Heart sounds: No murmur heard. Pulmonary:     Effort: Pulmonary effort is normal. No respiratory distress.     Breath sounds: Normal breath sounds.  Abdominal:     Palpations: Abdomen is soft.      Tenderness: There is no abdominal tenderness.  Musculoskeletal:        General: No swelling.     Cervical back: Neck supple.  Skin:    General: Skin is warm and dry.     Capillary Refill: Capillary refill takes less than 2 seconds.  Neurological:     General: No focal deficit present.     Mental Status: She is alert.     ED Results / Procedures / Treatments   Labs (all labs ordered are listed, but only abnormal results are displayed) Labs Reviewed  BASIC METABOLIC PANEL - Abnormal; Notable for the following components:      Result Value   Glucose, Bld 130 (*)    BUN 24 (*)    Creatinine,  Ser 1.09 (*)    GFR, Estimated 53 (*)    All other components within normal limits  CBC WITH DIFFERENTIAL/PLATELET - Abnormal; Notable for the following components:   RBC 5.35 (*)    Hemoglobin 15.3 (*)    All other components within normal limits  GROUP A STREP BY PCR  RESP PANEL BY RT-PCR (FLU A&B, COVID) [id number]    EKG None  Radiology DG Chest Port 1 View  Result Date: 06/21/2022 CLINICAL DATA:  76 year old female with history of cough, sore throat and body aches. EXAM: PORTABLE CHEST 1 VIEW COMPARISON:  Chest x-ray 05/10/2022. FINDINGS: Lung volumes are normal. No consolidative airspace disease. No pleural effusions. No pneumothorax. No pulmonary nodule or mass noted. Pulmonary vasculature and the cardiomediastinal silhouette are within normal limits. IMPRESSION: No radiographic evidence of acute cardiopulmonary disease. Electronically Signed   By: Vinnie Langton M.D.   On: 06/21/2022 07:41    Procedures Procedures    Medications Ordered in ED Medications  sodium chloride 0.9 % bolus 500 mL (has no administration in time range)  metoprolol tartrate (LOPRESSOR) tablet 25 mg (has no administration in time range)    ED Course/ Medical Decision Making/ A&P Clinical Course as of 06/21/22 1735  Mon Jun 21, 2022  0740 Chest x-ray interpreted by me as no acute infiltrates.  Awaiting  radiology reading. [MB]  [phone] Patient's work-up has been fairly benign.  Chest x-ray negative strep and flu/COVID-negative.  Chemistries mild mild elevation of BUN and creatinine and glucose.  Reviewed with patient.  Will cover for bronchitis and recommended continued hydration.  Patient will finish up her IV fluids here. [MB]    Clinical Course User Index [MB] Hayden Rasmussen, MD                           Medical Decision Making Amount and/or Complexity of Data Reviewed Labs: ordered. Radiology: ordered.  Risk Prescription drug management.   This patient complains of sore throat cough malaise; this involves an extensive number of treatment Options and is a complaint that carries with it a high risk of complications and morbidity. The differential includes viral syndrome, pneumonia, bronchitis, flu, COVID, strep throat  I ordered, reviewed and interpreted labs, which included rapid strep COVID and flu negative, CBC with normal white count normal hemoglobin, chemistries with some signs of dehydration I ordered medication IV fluids oral blood pressure medication and reviewed PMP when indicated. I ordered imaging studies which included chest x-ray and I independently    visualized and interpreted imaging which showed no acute infiltrates Additional history obtained from patient's family member Previous records obtained and reviewed in epic no recent admissions  Cardiac monitoring reviewed, normal sinus rhythm Social determinants considered, patient with stressors and decreased physical activity Critical Interventions: None  After the interventions stated above, I reevaluated the patient and found patient to be symptomatically improved Admission and further testing considered, no indications for admission or further testing at this time.  Recommended symptomatic treatment and close PCP follow-up.  Return instructions discussed         Final Clinical Impression(s) / ED  Diagnoses Final diagnoses:  Acute bronchitis, unspecified organism    Rx / DC Orders ED Discharge Orders          Ordered    azithromycin (ZITHROMAX) 250 MG tablet  Daily        06/21/22 0835    benzonatate (TESSALON) 100 MG capsule  Every  8 hours        06/21/22 0835              Hayden Rasmussen, MD 06/21/22 [phone]

## 2022-06-21 NOTE — ED Notes (Signed)
Urine specimen taken to lab.

## 2022-06-21 NOTE — ED Triage Notes (Signed)
Reports sore throat, cough, and body aches since Saturday.

## 2022-06-21 NOTE — ED Notes (Signed)
Pharmacy updated with patient 

## 2022-06-23 ENCOUNTER — Ambulatory Visit: Payer: Medicare Other | Admitting: Family Medicine

## 2022-06-23 DIAGNOSIS — G894 Chronic pain syndrome: Secondary | ICD-10-CM | POA: Diagnosis not present

## 2022-06-23 DIAGNOSIS — M961 Postlaminectomy syndrome, not elsewhere classified: Secondary | ICD-10-CM | POA: Diagnosis not present

## 2022-06-23 DIAGNOSIS — K5900 Constipation, unspecified: Secondary | ICD-10-CM | POA: Diagnosis not present

## 2022-06-23 DIAGNOSIS — M1712 Unilateral primary osteoarthritis, left knee: Secondary | ICD-10-CM | POA: Diagnosis not present

## 2022-06-24 ENCOUNTER — Inpatient Hospital Stay: Payer: Medicare Other

## 2022-06-24 ENCOUNTER — Inpatient Hospital Stay: Payer: Medicare Other | Admitting: Hematology & Oncology

## 2022-06-25 ENCOUNTER — Ambulatory Visit (INDEPENDENT_AMBULATORY_CARE_PROVIDER_SITE_OTHER): Payer: Medicare Other | Admitting: Family Medicine

## 2022-06-25 ENCOUNTER — Encounter: Payer: Self-pay | Admitting: Family Medicine

## 2022-06-25 VITALS — BP 107/69 | HR 66 | Temp 97.6°F | Ht 66.0 in | Wt 186.0 lb

## 2022-06-25 DIAGNOSIS — J209 Acute bronchitis, unspecified: Secondary | ICD-10-CM | POA: Diagnosis not present

## 2022-06-25 DIAGNOSIS — R5381 Other malaise: Secondary | ICD-10-CM

## 2022-06-25 DIAGNOSIS — R0789 Other chest pain: Secondary | ICD-10-CM | POA: Diagnosis not present

## 2022-06-25 DIAGNOSIS — N3941 Urge incontinence: Secondary | ICD-10-CM | POA: Diagnosis not present

## 2022-06-25 MED ORDER — MIRABEGRON ER 25 MG PO TB24: 25.0000 mg | ORAL_TABLET | Freq: Every day | ORAL | 1 refills | Status: DC

## 2022-06-25 NOTE — Progress Notes (Signed)
OFFICE VISIT  06/25/2022  CC:  Chief Complaint  Patient presents with   Follow-up    Chest wall pain, rib fracture    Patient is a 76 y.o. female who presents accompanied by her husband [name] for 1 mo f/u chest wall pain/rib fx's, gait instability, and recent ED visit.  INTERIM HX: She went to [facility] ED for respiratory illness on 06/21/2022.  Work-up there was reassuring (CXR, flu/covid NEG.  Lytes normal, BUN/Cr 24/1.09.  He was prescribed azithromycin and Tessalon and was given IV fluids.  Feels like lips and throat burning have resolved.  Nonproductive cough persisting, feeling tired but a little better. No SOB, some wheezing. No fever. She finished her azithromycin.  R chest wall pain a little better.  Chronic urinary incontinence bothering her a lot. No dysuria, abdominal pain, nausea, or fever.  Past Medical History:  Diagnosis Date   Allergic rhinitis    Anxiety    Chest pain    cr   Chronic gastritis    dx'd by EGD 2018 (erosive, likely NSAID-induced). 06/2019 +gastritis.   Chronic pain syndrome    right knee osteo, hx of TKA in R knee.  Pt was told by [name] that no further surgical options are available for her; also, chronic lower back pain.--[name].   Chronic renal insufficiency, stage II (mild) 2015   vs acute fluctuations depending on hydration?---GFR from 60s up to 80s.   Diverticulosis of colon    with hx of 'itis   DJD (degenerative joint disease)    L spine and knees.  R knee inject 03/15/18, L knee inject 12/26/18 (by Dr. Lynann Bologna).   Epigastric pain    As of 01/2017, GI (Dr. Loletha Carrow) planned EGD.   FATIGUE / MALAISE 07/01/2009   Chronic   GERD (gastroesophageal reflux disease)    History of colonic polyps    HTN (hypertension)    Hypercholesterolemia    recommended restart of simvastatin 07/2018   Hypothyroid 07/05/2012   IBS (irritable bowel syndrome)    IC (interstitial cystitis)    [name] at [facility]   Iron  deficiency anemia, unspecified 06/27/2013   Has required IV iron on multiple occasions ([name]).  Hemoccults neg 12/2016. Most recent hem f/u labs normal 09/2020.   Lumbar back pain    Spinal stenosis, lumbar region with neurogenic claudication L5-S1 sx's--ESI's no help.  CT L spine stable post-surg appearance as of 12/2019   Major depression, recurrent ([place])    vs dysthymia,chronic   Memory impairment 10/03/2012   Memory impairment 10/03/2012 >referral to neuro     Monoclonal gammopathy of undetermined significance    IgM kappa MGUS ([name]) routine f/u has shown stability of this problem (most recent f/u 09/2020)   Neuromuscular disorder ([place])    OSA (obstructive sleep apnea)    Latest sleep study 01/2017.  Some adjustments to CPAP being made+ dental referral for consideration of oral appliance --per pulm MD.   Osteopenia 04/2014; 03/2018   2015: T score -2.0 FRAX 11%/1.8%.  2019 FRAX 10%/1.5%.  10/2021 T score -1.6 radius, -0.9 femur   Otitis externa, candida 37/3428   [name] tx'd her with clotrimazole 1% external solution x 1 mo   Palpitations    Psoriasis    Trochanteric bursitis of both hips    + injections by ortho in the past   Vertigo 2020/21   6+ wks, ->to ENT->?vestib neuronitis with ongoing motion sensitivity, gradually resolving.([name]  10/18/19). MRI brain normal 09/2020.   Vitamin D deficiency    Xerostomia    and xerophthalmia--per [name] (he suggests SSA, SSB, ESR, rh factor, ANA, and antimicrosmal and antithyroglobulin ab's as of 02/17/16--all these labs were NORMAL.    Past Surgical History:  Procedure Laterality Date   ABDOMINAL HYSTERECTOMY     for prolapse with abdominal incision   BACK SURGERY  01/23/2015   Lumbar decompression with L2-L5 fusion ([name])   [place]     no PCI   CHOLECYSTECTOMY     COLONOSCOPY  04/2006;07/16/19   Diverticulosis, o/w normal.  06/2019->2 adenomas, +Diverticulosis.  Recall 2023-2025.   DEXA   04/06/2018   T score -1.5 femur neck, -2.2 radius.  FRAX 10%/1.5%.  10/2021 T score -1.6 radius, -0.9 femur. rpt 2 yrs   ESOPHAGOGASTRODUODENOSCOPY  05/29/2009; 03/01/17;07/16/19   Esoph normal.  Gastritis (H pylori NEG) + gastric polyps (benign).  Duodenal biopsy NORMAL.  2018-mild chronic gastritis, H pylori NEG. 06/2019 tortuous esophagus w/out stenosis +chronic gastritis H pyl neg.   EYE SURGERY Left    "lazy eye"   HAND SURGERY Right    TRIGGER FINGER; index finger   HERNIA REPAIR     umbilical   KNEE ARTHROSCOPY     RIGHT KNEE X 5   LAMINOTOMY  2010   L-4, L-5 FUSION   NASAL SINUS SURGERY     polypectomy (remote past)   OOPHORECTOMY  2010   Laparoscopic BSO (path benign)   PATELLA FRACTURE SURGERY  06/2007   [name]   POSTERIOR LAMINECTOMY / DECOMPRESSION LUMBAR SPINE  05/2008   [name]   TOTAL KNEE ARTHROPLASTY  12-07 and 4-07   [name], revision 12-09 [name] at [facility]    Outpatient Medications Prior to Visit  Medication Sig Dispense Refill   acetaminophen (TYLENOL) 325 MG tablet Take 650 mg by mouth every 6 (six) hours as needed.     ALPRAZolam (XANAX) 1 MG tablet TAKE 1 TO 2 TABLETS BY MOUTH TWICE DAILY AS NEEDED 120 tablet 5   azithromycin (ZITHROMAX) 250 MG tablet Take 1 tablet (250 mg total) by mouth daily. Take first 2 tablets together, then 1 every day until finished. 6 tablet 0   benzonatate (TESSALON) 100 MG capsule Take 1 capsule (100 mg total) by mouth every 8 (eight) hours. 21 capsule 0   calcium carbonate (OSCAL) 1500 (600 Ca) MG TABS tablet Take by mouth daily.     Cholecalciferol (VITAMIN D3) 5000 units CAPS Take 4 capsules by mouth daily.      diclofenac Sodium (VOLTAREN) 1 % GEL Apply 4 g topically 4 (four) times daily.     doxylamine, Sleep, (UNISOM) 25 MG tablet Take 25 mg by mouth at bedtime as needed.     hydrocortisone 2.5 % cream Apply 1 application  topically as needed. RECTAL ITCHING     levothyroxine (SYNTHROID) 75 MCG tablet Take 1 tablet (75 mcg  total) by mouth daily. 90 tablet 0   lidocaine (LIDODERM) 5 % Place 3 patches onto the skin daily. Remove & Discard patch within 12 hours. 30 patch 1   meclizine (ANTIVERT) 25 MG tablet TAKE 1 TABLET BY MOUTH THREE TIMES DAILY AS NEEDED FOR DIZZINESS 45 tablet 0   metoprolol tartrate (LOPRESSOR) 25 MG tablet Take 1 tablet by mouth twice daily 180 tablet 0   Multiple Vitamins-Iron (MULTIVITAMIN/IRON PO) Take 1 tablet by mouth daily.     NIACIN PO Take by mouth daily.  omeprazole (PRILOSEC) 40 MG capsule Take 1 capsule (40 mg total) by mouth daily. 90 capsule 3   Oxycodone HCl 10 MG TABS 10 mg every 4 (four) hours as needed.     polyethylene glycol (MIRALAX / GLYCOLAX) 17 g packet Take 17 g by mouth daily.     promethazine (PHENERGAN) 25 MG tablet TAKE 1 TABLET BY MOUTH EVERY 6 HOURS AS NEEDED FOR NAUSEA AND VOMITING 90 tablet 2   simvastatin (ZOCOR) 20 MG tablet Take 1 tablet (20 mg total) by mouth at bedtime. 90 tablet 1   Specialty Vitamins Products (MAGNESIUM, AMINO ACID CHELATE,) 133 MG tablet Take 1 tablet by mouth daily.     tolterodine (DETROL LA) 4 MG 24 hr capsule Take 1 capsule (4 mg total) by mouth daily. 90 capsule 1   traZODone (DESYREL) 50 MG tablet Take 1 tablet by mouth at bedtime.  2   sulfamethoxazole-trimethoprim (BACTRIM DS) 800-160 MG tablet Take 1 tablet by mouth 2 (two) times daily. 10 tablet 0   Azelastine HCl (ASTEPRO) 0.15 % SOLN 2 sprays each nostril every 12 hours (Patient not taking: Reported on 05/27/2022) 30 mL 11   cyclobenzaprine (FLEXERIL) 5 MG tablet Take 5 mg by mouth at bedtime. As needed (Patient not taking: Reported on 05/27/2022)     No facility-administered medications prior to visit.    Allergies  Allergen Reactions   Cymbalta [Duloxetine Hcl]     Facial edema   Iohexol      Code: HIVES, Desc: pt states her face and throat swells when administered IV contrast - KB, Onset Date: [id number]    Mepergan [Meperidine-Promethazine]     Cardiac arrest    Morphine Rash   Augmentin [Amoxicillin-Pot Clavulanate] Nausea Only   Citalopram Other (See Comments)    Jittery/heart racing    Hctz [Hydrochlorothiazide] Other (See Comments)    Too much urinating, felt dried out, etc--NO ALLERGY   Lexapro [Escitalopram Oxalate] Other (See Comments)    Jittery,heart racing   Lyrica [Pregabalin] Swelling    Face, hands, fingers, ankles   Promethazine Hcl    Valsartan     REACTION: pt states INTOL to Diovan    ROS As per HPI  PE:    06/25/2022    1:01 PM 06/21/2022    8:45 AM 06/21/2022    8:00 AM  Vitals with BMI  Height _0     Weight 186 lbs    BMI 92.11    Systolic [phone]  Diastolic 69 63 83  Pulse 66 62 73  02 sat 96% RA today  Physical Exam  Neuro: Alert and tired appearing but no distress. ENT: Ears: EACs clear, normal epithelium.  TMs with good light reflex and landmarks bilaterally.  Eyes: no injection, icteris, swelling, or exudate.  EOMI, PERRLA. Nose: no drainage or turbinate edema/swelling.  No injection or focal lesion.  Mouth: lips without lesion/swelling.  Oral mucosa pink and moist.  Dentition intact and without obvious caries or gingival swelling.  Oropharynx without erythema, exudate, or swelling.  CV: RRR, no m/r/g.   LUNGS: CTA bilat, nonlabored resps, good aeration in all lung fields.  LABS:  Last CBC Lab Results  Component Value Date   WBC 9.0 06/21/2022   HGB 15.3 (H) 06/21/2022   HCT 45.2 06/21/2022   MCV 84.5 06/21/2022   MCH 28.6 06/21/2022   RDW 12.5 06/21/2022   PLT 223 48/18/5631   Last metabolic panel Lab Results  Component Value Date   GLUCOSE  130 (H) 06/21/2022   NA 139 06/21/2022   K 4.0 06/21/2022   CL 105 06/21/2022   CO2 23 06/21/2022   BUN 24 (H) 06/21/2022   CREATININE 1.09 (H) 06/21/2022   GFRNONAA 53 (L) 06/21/2022   CALCIUM 9.5 06/21/2022   PROT 7.2 11/10/2021   ALBUMIN 4.0 11/10/2021   LABGLOB 3.3 11/10/2021   AGRATIO 1.1 11/10/2021   BILITOT 0.6 11/10/2021   ALKPHOS  97 11/10/2021   AST 17 11/10/2021   ALT 17 11/10/2021   ANIONGAP 11 06/21/2022   IMPRESSION AND PLAN:  #1 viral bronchitis. Reassured.  Continue Tessalon Perles as needed.   2.  Right chest wall pain, history of seventh and eighth rib fractures.  This is gradually improving.  #3 urinary incontinence. She is not sure if she is taking the Detrol that is on her med list currently. She has been seen by urology in the past and was getting tibial nerve stimulation at 1 point in time. She does not recall whether this was helping or not. We will do trial of Myrbetriq 25 mg/day.  #4 debilitation.  She is going to move forward with physical therapy for her chest wall pain and her chronic unsteady gait but wants to wait until she is over her current respiratory illness first. It sounds like she may be moving forward with left knee replacement surgery.  Orthopedics has her tapering down her opioids some in preparation for this.  An After Visit Summary was printed and given to the patient.  FOLLOW UP: Return in about 4 weeks (around 07/23/2022) for Follow up [facility].  Signed:  Crissie Sickles, MD           06/25/2022

## 2022-07-01 ENCOUNTER — Telehealth: Payer: Self-pay

## 2022-07-01 NOTE — Telephone Encounter (Signed)
Pt called stating that the cough medication that was sent is too expensive for her to get. She still has a bad cough and now has vertigo. Pt is requesting to have something else sent to the pharmacy to help her. She sounds very weak on the phone. Pt is requesting to have something sent to [facility] [address]. Please advise

## 2022-07-02 MED ORDER — PREDNISONE 20 MG PO TABS: ORAL_TABLET | ORAL | 0 refills | Status: DC

## 2022-07-02 MED ORDER — CEFDINIR 300 MG PO CAPS: 300.0000 mg | ORAL_CAPSULE | Freq: Two times a day (BID) | ORAL | 0 refills | Status: DC

## 2022-07-02 NOTE — Addendum Note (Signed)
Addended by: Deveron Furlong D on: 07/02/2022 01:59 PM   Modules accepted: Orders

## 2022-07-02 NOTE — Telephone Encounter (Signed)
Pt completed tessalon perles and z-pack. Pls send rx to [facility].   Please Advise.

## 2022-07-02 NOTE — Telephone Encounter (Signed)
Prescriptions sent to [facility]. LM for pt to return call to discuss.

## 2022-07-02 NOTE — Telephone Encounter (Signed)
Pt has called again this morning still sounding weak and asking that something can be called in and to make sure it goes to the [facility] on [address] in [place].

## 2022-07-02 NOTE — Telephone Encounter (Signed)
Pls do rx for prednisone '20mg'$ , 2 tabs po qd x 3d, then 1 tab po qd x 3d, #9, no RF. Also cefdinir 300 mg, 1 tab po bid x 7d, #14, no RF. The only different kind of cough medication I can rx has hydrocodone in it.  She is already on opioid pain med and I can't give this. Take mucinex dm otc as directed on the packaging. F/u next week if not improving. -thx

## 2022-07-05 NOTE — Telephone Encounter (Signed)
Pt advised of medication recommendations.

## 2022-07-08 ENCOUNTER — Encounter: Payer: Self-pay | Admitting: Hematology & Oncology

## 2022-07-08 ENCOUNTER — Other Ambulatory Visit: Payer: Self-pay

## 2022-07-08 ENCOUNTER — Inpatient Hospital Stay: Payer: Medicare Other | Attending: Hematology & Oncology

## 2022-07-08 ENCOUNTER — Inpatient Hospital Stay (HOSPITAL_BASED_OUTPATIENT_CLINIC_OR_DEPARTMENT_OTHER): Payer: Medicare Other | Admitting: Hematology & Oncology

## 2022-07-08 VITALS — BP 127/74 | HR 66 | Temp 98.3°F | Resp 18 | Wt 184.0 lb

## 2022-07-08 DIAGNOSIS — D509 Iron deficiency anemia, unspecified: Secondary | ICD-10-CM | POA: Diagnosis not present

## 2022-07-08 DIAGNOSIS — D62 Acute posthemorrhagic anemia: Secondary | ICD-10-CM

## 2022-07-08 DIAGNOSIS — D500 Iron deficiency anemia secondary to blood loss (chronic): Secondary | ICD-10-CM

## 2022-07-08 DIAGNOSIS — D472 Monoclonal gammopathy: Secondary | ICD-10-CM | POA: Insufficient documentation

## 2022-07-08 LAB — RETICULOCYTES
Immature Retic Fract: 7 % (ref 2.3–15.9)
RBC.: 4.81 MIL/uL (ref 3.87–5.11)
Retic Count, Absolute: 76.5 10*3/uL (ref 19.0–186.0)
Retic Ct Pct: 1.6 % (ref 0.4–3.1)

## 2022-07-08 LAB — CMP (CANCER CENTER ONLY)
ALT: 18 U/L (ref 0–44)
AST: 14 U/L — ABNORMAL LOW (ref 15–41)
Albumin: 4.4 g/dL (ref 3.5–5.0)
Alkaline Phosphatase: 91 U/L (ref 38–126)
Anion gap: 10 (ref 5–15)
BUN: 17 mg/dL (ref 8–23)
CO2: 24 mmol/L (ref 22–32)
Calcium: 9.5 mg/dL (ref 8.9–10.3)
Chloride: 105 mmol/L (ref 98–111)
Creatinine: 0.91 mg/dL (ref 0.44–1.00)
GFR, Estimated: >60 mL/min (ref >60)
Glucose, Bld: 107 mg/dL — ABNORMAL HIGH (ref 70–99)
Potassium: 4.4 mmol/L (ref 3.5–5.1)
Sodium: 139 mmol/L (ref 135–145)
Total Bilirubin: 0.4 mg/dL (ref 0.3–1.2)
Total Protein: 7.1 g/dL (ref 6.5–8.1)

## 2022-07-08 LAB — CBC WITH DIFFERENTIAL (CANCER CENTER ONLY)
Abs Immature Granulocytes: 0.09 10*3/uL — ABNORMAL HIGH (ref 0.00–0.07)
Basophils Absolute: 0.1 10*3/uL (ref 0.0–0.1)
Basophils Relative: 1 %
Eosinophils Absolute: 0.4 10*3/uL (ref 0.0–0.5)
Eosinophils Relative: 5 %
HCT: 41.7 % (ref 36.0–46.0)
Hemoglobin: 13.8 g/dL (ref 12.0–15.0)
Immature Granulocytes: 1 %
Lymphocytes Relative: 33 %
Lymphs Abs: 2.3 10*3/uL (ref 0.7–4.0)
MCH: 28.6 pg (ref 26.0–34.0)
MCHC: 33.1 g/dL (ref 30.0–36.0)
MCV: 86.5 fL (ref 80.0–100.0)
Monocytes Absolute: 0.3 10*3/uL (ref 0.1–1.0)
Monocytes Relative: 4 %
Neutro Abs: 3.8 10*3/uL (ref 1.7–7.7)
Neutrophils Relative %: 56 %
Platelet Count: 238 10*3/uL (ref 150–400)
RBC: 4.82 MIL/uL (ref 3.87–5.11)
RDW: 12.8 % (ref 11.5–15.5)
WBC Count: 6.9 10*3/uL (ref 4.0–10.5)
nRBC: 0 % (ref 0.0–0.2)

## 2022-07-08 LAB — FERRITIN: Ferritin: 167 ng/mL (ref 11–307)

## 2022-07-08 LAB — LACTATE DEHYDROGENASE: LDH: 129 U/L (ref 98–192)

## 2022-07-08 NOTE — Progress Notes (Signed)
Hematology and Oncology Follow Up Visit  [name] [id number] Oct 17, 1945 76 y.o. 10/05/2018   Principle Diagnosis:  1. IgM kappa monoclonal gammopathy of undetermined significance 2. Intermittent iron deficiency anemia  Current Therapy:   IV iron as indicated - last received in January 2021    Interim History:  [name] is here today for follow-up.  Overall, she is about the same.  She had a lot of problems with her left knee.  She is to have knee surgery.  She will see about having this done.  Her husband is still recovering from his liver cancer surgery.  He seems to be doing fairly well.  [name] fell.  She just got tripped up.  Thankfully she did not break anything at all think.  I think she may have cracked some ribs.  When we last saw her, her IgM level was 580 mg/dL.  This is relatively stable.  Her monoclonal spike was 0.3 g/dL.  Her last iron studies that we do are back in February showed a ferritin of 206 with an iron saturation of 21%.  She has had no fever.  Thankfully, she is avoided COVID.  Her last mammogram was done back in February of this year.  Everything looked fine on the mammogram.  She is little bit constipated.  This probably is from her pain medications.  She is being followed by Pain Management.  Overall, I would say that her performance status right now is probably ECOG 2.   Medications:  Allergies as of 10/05/2018       Reactions   Cymbalta [duloxetine Hcl]    Facial edema   Iohexol     Code: HIVES, Desc: pt states her face and throat swells when administered IV contrast - KB, Onset Date: 09326712   Mepergan [meperidine-promethazine]    Cardiac arrest   Morphine Rash   Augmentin [amoxicillin-pot Clavulanate] Nausea Only   Citalopram Other (See Comments)   Jittery/heart racing   Hctz [hydrochlorothiazide] Other (See Comments)   Too much urinating, felt dried out, etc--NO ALLERGY   Lexapro [escitalopram Oxalate] Other (See  Comments)   Jittery,heart racing   Lyrica [pregabalin] Swelling   Face, hands, fingers, ankles   Valsartan    REACTION: pt states INTOL to Diovan        Medication List        Accurate as of October 05, 2018 12:55 PM. Always use your most recent med list.          acetaminophen 325 MG tablet Commonly known as:  TYLENOL Take 650 mg by mouth every 6 (six) hours as needed.   ALIGN 4 MG Caps Take 1 capsule (4 mg total) by mouth daily.   ALPRAZolam 0.5 MG tablet Commonly known as:  XANAX TAKE ONE-HALF TO ONE TABLET BY MOUTH THREE TIMES DAILY   Azelastine HCl 0.15 % Soln Commonly known as:  ASTEPRO 2 sprays each nostril every 12 hours   calcium carbonate 1500 (600 Ca) MG Tabs tablet Commonly known as:  OSCAL Take by mouth daily.   dicyclomine 10 MG capsule Commonly known as:  BENTYL Take 1 capsule (10 mg total) by mouth 3 (three) times daily before meals.   hydrocortisone 2.5 % cream Apply 1 application topically as needed. RECTAL ITCHING   levothyroxine 75 MCG tablet Commonly known as:  SYNTHROID, LEVOTHROID TAKE 1/2 (ONE-HALF) TABLET BY MOUTH ONCE DAILY   magnesium (amino acid chelate) 133 MG tablet Take 1 tablet by mouth daily.  meclizine 25 MG tablet Commonly known as:  ANTIVERT Take 1 tablet (25 mg total) by mouth every 6 (six) hours as needed for dizziness.   MELATONIN PO Take by mouth.   meloxicam 7.5 MG tablet Commonly known as:  MOBIC TAKE 2 TABLETS (15 MG TOTAL) BY MOUTH DAILY.   metoprolol tartrate 25 MG tablet Commonly known as:  LOPRESSOR Take 1 tablet (25 mg total) by mouth daily.   MULTIVITAMIN/IRON PO Take 1 tablet by mouth daily.   NIACIN PO Take by mouth.   omeprazole 40 MG capsule Commonly known as:  PRILOSEC Take 1 capsule (40 mg total) by mouth daily.   Oxycodone HCl 10 MG Tabs 10 mg every 4 (four) hours as needed. 1 every 6 hours for pain   promethazine 25 MG tablet Commonly known as:  PHENERGAN TAKE ONE TABLET BY  MOUTH EVERY 6 HOURS AS NEEDED FOR NAUSEA AND VOMITING   RED YEAST RICE PO Take by mouth.   simvastatin 20 MG tablet Commonly known as:  ZOCOR Take 1 tablet (20 mg total) by mouth at bedtime.   SLEEP AID 25 MG tablet Generic drug:  doxylamine (Sleep) Take 25 mg by mouth at bedtime as needed.   traZODone 50 MG tablet Commonly known as:  DESYREL Take 1 tablet by mouth at bedtime.   Vitamin D3 125 MCG (5000 UT) Caps Take 2 capsules by mouth daily.        Allergies:  Allergies  Allergen Reactions   Cymbalta [Duloxetine Hcl]     Facial edema   Iohexol      Code: HIVES, Desc: pt states her face and throat swells when administered IV contrast - KB, Onset Date: 41962229    Mepergan [Meperidine-Promethazine]     Cardiac arrest   Morphine Rash   Augmentin [Amoxicillin-Pot Clavulanate] Nausea Only   Citalopram Other (See Comments)    Jittery/heart racing    Hctz [Hydrochlorothiazide] Other (See Comments)    Too much urinating, felt dried out, etc--NO ALLERGY   Lexapro [Escitalopram Oxalate] Other (See Comments)    Jittery,heart racing   Lyrica [Pregabalin] Swelling    Face, hands, fingers, ankles   Valsartan     REACTION: pt states INTOL to Diovan    Past Medical History, Surgical history, Social history, and Family History were reviewed and updated.  Review of Systems: Review of Systems  Constitutional: Positive for malaise/fatigue.  Eyes: Positive for blurred vision.  Respiratory: Positive for wheezing.   Cardiovascular: Positive for palpitations.  Gastrointestinal: Positive for nausea.  Genitourinary: Positive for urgency.  Musculoskeletal: Positive for back pain, joint pain and myalgias.  Neurological: Positive for tingling.  Endo/Heme/Allergies: Negative.   Psychiatric/Behavioral: Negative.      Physical Exam:  weight is 174 lb (78.9 kg). Her oral temperature is 97.8 F (36.6 C). Her blood pressure is 135/52 (abnormal) and her pulse is 52 (abnormal). Her  respiration is 18 and oxygen saturation is 98%.   Wt Readings from Last 3 Encounters:  10/05/18 174 lb (78.9 kg)  09/05/18 178 lb (80.7 kg)  07/24/18 174 lb 8 oz (79.2 kg)    Physical Exam Vitals signs reviewed.  HENT:     Head: Normocephalic and atraumatic.  Eyes:     Pupils: Pupils are equal, round, and reactive to light.  Neck:     Musculoskeletal: Normal range of motion.  Cardiovascular:     Rate and Rhythm: Normal rate and regular rhythm.     Heart sounds: Normal heart sounds.  Pulmonary:     Effort: Pulmonary effort is normal.     Breath sounds: Normal breath sounds.  Abdominal:     General: Bowel sounds are normal.     Palpations: Abdomen is soft.  Musculoskeletal: Normal range of motion.        General: No tenderness or deformity.  Lymphadenopathy:     Cervical: No cervical adenopathy.  Skin:    General: Skin is warm and dry.     Findings: No erythema or rash.  Neurological:     Mental Status: She is alert and oriented to person, place, and time.  Psychiatric:        Behavior: Behavior normal.        Thought Content: Thought content normal.        Judgment: Judgment normal.     Lab Results  Component Value Date   WBC 14.1 (H) 10/05/2018   HGB 14.8 10/05/2018   HCT 45.9 10/05/2018   MCV 91.4 10/05/2018   PLT 260 10/05/2018   Lab Results  Component Value Date   FERRITIN 143 04/06/2018   IRON 31 (L) 04/06/2018   TIBC 284 04/06/2018   UIBC 253 04/06/2018   IRONPCTSAT 11 (L) 04/06/2018   Lab Results  Component Value Date   RETICCTPCT 1.2 01/20/2011   RBC 5.02 10/05/2018   RETICCTABS 52.2 01/20/2011   Lab Results  Component Value Date   KPAFRELGTCHN 17.3 04/06/2018   LAMBDASER 16.6 04/06/2018   KAPLAMBRATIO 1.04 04/06/2018   Lab Results  Component Value Date   IGGSERUM 667 (L) 04/06/2018   IGA 287 04/06/2018   IGMSERUM 630 (H) 04/06/2018   Lab Results  Component Value Date   TOTALPROTELP 6.7 04/06/2018   ALBUMINELP 3.3 04/06/2018   A1GS  0.2 04/06/2018   A2GS 0.9 04/06/2018   BETS 1.0 04/06/2018   BETA2SER 0.5 06/19/2015   GAMS 1.2 04/06/2018   MSPIKE 0.5 (H) 04/06/2018   SPEI Comment 10/06/2017     Chemistry      Component Value Date/Time   NA 142 10/05/2018 1108   NA 138 03/25/2017 1333   NA 138 11/25/2016 1147   K 3.8 10/05/2018 1108   K 4.3 03/25/2017 1333   K 3.8 11/25/2016 1147   CL 103 10/05/2018 1108   CL 104 03/25/2017 1333   CL 103 01/15/2010 0835   CO2 29 10/05/2018 1108   CO2 24 03/25/2017 1333   CO2 25 11/25/2016 1147   BUN 12 10/05/2018 1108   BUN 12 03/25/2017 1333   BUN 15.3 11/25/2016 1147   CREATININE 0.93 10/05/2018 1108   CREATININE 0.76 03/25/2017 1333   CREATININE 0.8 11/25/2016 1147      Component Value Date/Time   CALCIUM 9.4 10/05/2018 1108   CALCIUM 9.5 03/25/2017 1333   CALCIUM 9.2 11/25/2016 1147   ALKPHOS 104 10/05/2018 1108   ALKPHOS 111 03/25/2017 1333   ALKPHOS 99 11/25/2016 1147   AST 11 (L) 10/05/2018 1108   AST 24 11/25/2016 1147   ALT 14 10/05/2018 1108   ALT 30 11/25/2016 1147   BILITOT 0.5 10/05/2018 1108   BILITOT 0.61 11/25/2016 1147      Impression and Plan: [name] is a very pleasant 76 yo caucasian female with MGUS and history of iron deficiency.   From a hematologic point of view, everything looks to be doing pretty well.  If she does need to have knee surgery, I do not see a problem with this from my point of view.  We will plan to get her back in 6 months.  Maybe, by then, she would have had knee surgery.    [name], MD 1/16/202012:55 PM

## 2022-07-09 LAB — IGG, IGA, IGM
IgA: 247 mg/dL (ref 64–422)
IgG (Immunoglobin G), Serum: 621 mg/dL (ref 586–1602)
IgM (Immunoglobulin M), Srm: 522 mg/dL — ABNORMAL HIGH (ref 26–217)

## 2022-07-09 LAB — IRON AND IRON BINDING CAPACITY (CC-WL,HP ONLY)
Iron: 55 ug/dL (ref 28–170)
Saturation Ratios: 15 % (ref 10.4–31.8)
TIBC: 377 ug/dL (ref 250–450)
UIBC: 322 ug/dL (ref 148–442)

## 2022-07-09 LAB — KAPPA/LAMBDA LIGHT CHAINS
Kappa free light chain: 23.7 mg/L — ABNORMAL HIGH (ref 3.3–19.4)
Kappa, lambda light chain ratio: 1.51 (ref 0.26–1.65)
Lambda free light chains: 15.7 mg/L (ref 5.7–26.3)

## 2022-07-15 LAB — PROTEIN ELECTROPHORESIS, SERUM, WITH REFLEX
A/G Ratio: 1.2 (ref 0.7–1.7)
Albumin ELP: 3.6 g/dL (ref 2.9–4.4)
Alpha-1-Globulin: 0.2 g/dL (ref 0.0–0.4)
Alpha-2-Globulin: 0.7 g/dL (ref 0.4–1.0)
Beta Globulin: 1 g/dL (ref 0.7–1.3)
Gamma Globulin: 1 g/dL (ref 0.4–1.8)
Globulin, Total: 3 g/dL (ref 2.2–3.9)
M-Spike, %: 0.4 g/dL — ABNORMAL HIGH
SPEP Interpretation: 0
Total Protein ELP: 6.6 g/dL (ref 6.0–8.5)

## 2022-07-15 LAB — IMMUNOFIXATION REFLEX, SERUM
IgA: 259 mg/dL (ref 64–422)
IgG (Immunoglobin G), Serum: 678 mg/dL (ref 586–1602)
IgM (Immunoglobulin M), Srm: 554 mg/dL — ABNORMAL HIGH (ref 26–217)

## 2022-07-23 ENCOUNTER — Other Ambulatory Visit: Payer: Self-pay | Admitting: Endocrinology

## 2022-07-23 ENCOUNTER — Ambulatory Visit: Payer: Medicare Other | Admitting: Family Medicine

## 2022-07-23 DIAGNOSIS — E039 Hypothyroidism, unspecified: Secondary | ICD-10-CM

## 2022-07-23 NOTE — Progress Notes (Deleted)
OFFICE VISIT  07/23/2022  CC: No chief complaint on file.  Patient is a 76 y.o. female who presents for 1 month follow-up chronic urinary urge incontinence. A/P as of last visit: "#1 viral bronchitis. Reassured.  Continue Tessalon Perles as needed.  #2.  Right chest wall pain, history of seventh and eighth rib fractures.  This is gradually improving.  #3 urinary incontinence. She is not sure if she is taking the Detrol that is on her med list currently. She has been seen by urology in the past and was getting tibial nerve stimulation at 1 point in time. She does not recall whether this was helping or not. We will do trial of Myrbetriq 25 mg/day." #4 debilitation.  She is going to move forward with physical therapy for her chest wall pain and her chronic unsteady gait but wants to wait until she is over her current respiratory illness first. It sounds like she may be moving forward with left knee replacement surgery.  Orthopedics has her tapering down her opioids some in preparation for this.  INTERIM HX: ***  Past Medical History:  Diagnosis Date   Allergic rhinitis    Anxiety    Chronic gastritis    dx'd by EGD 2018 (erosive, likely NSAID-induced). 06/2019 +gastritis.   Chronic pain syndrome    right knee osteo, hx of TKA in R knee.  Pt was told by [name] that no further surgical options are available for her; also, chronic lower back pain.--[name].   Chronic renal insufficiency, stage II (mild) 2015   vs acute fluctuations depending on hydration?---GFR from 60s up to 80s.   Diverticulosis of colon    with hx of 'itis   DJD (degenerative joint disease)    L spine and knees.  R knee inject 03/15/18, L knee inject 12/26/18 (by Dr. Lynann Bologna).   FATIGUE / MALAISE 07/01/2009   Chronic   GERD (gastroesophageal reflux disease)    History of colonic polyps    HTN (hypertension)    Hypercholesterolemia    recommended restart of simvastatin 07/2018   Hypothyroid  07/05/2012   IBS (irritable bowel syndrome)    IC (interstitial cystitis)    [name] at [facility]   Iron deficiency anemia, unspecified 06/27/2013   Has required IV iron on multiple occasions ([name]).  Hemoccults neg 12/2016. Most recent hem f/u labs normal 09/2020.   Lumbar back pain    Spinal stenosis, lumbar region with neurogenic claudication L5-S1 sx's--ESI's no help.  CT L spine stable post-surg appearance as of 12/2019   Major depression, recurrent ([place])    vs dysthymia,chronic   Memory impairment 10/03/2012   Memory impairment 10/03/2012 >referral to neuro     Monoclonal gammopathy of undetermined significance    IgM kappa MGUS ([name]) routine f/u has shown stability of this problem (most recent f/u 09/2020)   OSA (obstructive sleep apnea)    Latest sleep study 01/2017.  Some adjustments to CPAP being made+ dental referral for consideration of oral appliance --per pulm MD.   Osteopenia 04/2014; 03/2018   2015: T score -2.0 FRAX 11%/1.8%.  2019 FRAX 10%/1.5%.  10/2021 T score -1.6 radius, -0.9 femur   Palpitations    Psoriasis    Trochanteric bursitis of both hips    + injections by ortho in the past   Vertigo 2020/21   6+ wks, ->to ENT->?vestib neuronitis with ongoing motion sensitivity, gradually resolving.([name] [birth year]). MRI brain normal 09/2020.   Vitamin D deficiency  Xerostomia    and xerophthalmia--per [name] (he suggests SSA, SSB, ESR, rh factor, ANA, and antimicrosmal and antithyroglobulin ab's as of 02/17/16--all these labs were NORMAL.    Past Surgical History:  Procedure Laterality Date   ABDOMINAL HYSTERECTOMY     for prolapse with abdominal incision   BACK SURGERY  01/23/2015   Lumbar decompression with L2-L5 fusion ([name])   [facility]     no PCI   CHOLECYSTECTOMY     COLONOSCOPY  04/2006;07/16/19   Diverticulosis, o/w normal.  06/2019->2 adenomas, +Diverticulosis.  Recall 2023-2025.   DEXA  04/06/2018   T score -1.5  femur neck, -2.2 radius.  FRAX 10%/1.5%.  10/2021 T score -1.6 radius, -0.9 femur. rpt 2 yrs   ESOPHAGOGASTRODUODENOSCOPY  05/29/2009; 03/01/17;07/16/19   Esoph normal.  Gastritis (H pylori NEG) + gastric polyps (benign).  Duodenal biopsy NORMAL.  2018-mild chronic gastritis, H pylori NEG. 06/2019 tortuous esophagus w/out stenosis +chronic gastritis H pyl neg.   EYE SURGERY Left    "lazy eye"   HAND SURGERY Right    TRIGGER FINGER; index finger   HERNIA REPAIR     umbilical   KNEE ARTHROSCOPY     RIGHT KNEE X 5   LAMINOTOMY  2010   L-4, L-5 FUSION   NASAL SINUS SURGERY     polypectomy (remote past)   OOPHORECTOMY  2010   Laparoscopic BSO (path benign)   PATELLA FRACTURE SURGERY  06/2007   [name]   POSTERIOR LAMINECTOMY / DECOMPRESSION LUMBAR SPINE  05/2008   [name]   TOTAL KNEE ARTHROPLASTY Right 12-07 and 4-07   [name], revision 12-09 [name] at [facility]    Outpatient Medications Prior to Visit  Medication Sig Dispense Refill   acetaminophen (TYLENOL) 325 MG tablet Take 650 mg by mouth every 6 (six) hours as needed.     ALPRAZolam (XANAX) 1 MG tablet TAKE 1 TO 2 TABLETS BY MOUTH TWICE DAILY AS NEEDED 120 tablet 5   Azelastine HCl (ASTEPRO) 0.15 % SOLN 2 sprays each nostril every 12 hours (Patient not taking: Reported on 05/27/2022) 30 mL 11   azithromycin (ZITHROMAX) 250 MG tablet Take 1 tablet (250 mg total) by mouth daily. Take first 2 tablets together, then 1 every day until finished. 6 tablet 0   calcium carbonate (OSCAL) 1500 (600 Ca) MG TABS tablet Take by mouth daily.     Cholecalciferol (VITAMIN D3) 5000 units CAPS Take 4 capsules by mouth daily.      cyclobenzaprine (FLEXERIL) 5 MG tablet Take 5 mg by mouth at bedtime. As needed (Patient not taking: Reported on 05/27/2022)     diclofenac Sodium (VOLTAREN) 1 % GEL Apply 4 g topically 4 (four) times daily.     doxylamine, Sleep, (UNISOM) 25 MG tablet Take 25 mg by mouth at bedtime as needed.     hydrocortisone 2.5 % cream  Apply 1 application  topically as needed. RECTAL ITCHING     levothyroxine (SYNTHROID) 75 MCG tablet Take 1 tablet (75 mcg total) by mouth daily. 90 tablet 0   lidocaine (LIDODERM) 5 % Place 3 patches onto the skin daily. Remove & Discard patch within 12 hours. 30 patch 1   meclizine (ANTIVERT) 25 MG tablet TAKE 1 TABLET BY MOUTH THREE TIMES DAILY AS NEEDED FOR DIZZINESS 45 tablet 0   metoprolol tartrate (LOPRESSOR) 25 MG tablet Take 1 tablet by mouth twice daily 180 tablet 0   mirabegron ER (MYRBETRIQ) 25 MG TB24 tablet Take  1 tablet (25 mg total) by mouth daily. 30 tablet 1   Multiple Vitamins-Iron (MULTIVITAMIN/IRON PO) Take 1 tablet by mouth daily.     NIACIN PO Take by mouth daily.     omeprazole (PRILOSEC) 40 MG capsule Take 1 capsule (40 mg total) by mouth daily. 90 capsule 3   Oxycodone HCl 10 MG TABS 10 mg every 4 (four) hours as needed.     polyethylene glycol (MIRALAX / GLYCOLAX) 17 g packet Take 17 g by mouth daily.     predniSONE (DELTASONE) 20 MG tablet 2 tabs po qd x 3d, then 1 tab po qd x3d 9 tablet 0   promethazine (PHENERGAN) 25 MG tablet TAKE 1 TABLET BY MOUTH EVERY 6 HOURS AS NEEDED FOR NAUSEA AND VOMITING 90 tablet 2   simvastatin (ZOCOR) 20 MG tablet Take 1 tablet (20 mg total) by mouth at bedtime. 90 tablet 1   Specialty Vitamins Products (MAGNESIUM, AMINO ACID CHELATE,) 133 MG tablet Take 1 tablet by mouth daily.     tolterodine (DETROL LA) 4 MG 24 hr capsule Take 1 capsule (4 mg total) by mouth daily. 90 capsule 1   traZODone (DESYREL) 50 MG tablet Take 1 tablet by mouth at bedtime.  2   No facility-administered medications prior to visit.    Allergies  Allergen Reactions   Cymbalta [Duloxetine Hcl]     Facial edema   Iohexol      Code: HIVES, Desc: pt states her face and throat swells when administered IV contrast - KB, Onset Date: 36468032    Mepergan [Meperidine-Promethazine]     Cardiac arrest   Morphine Rash   Augmentin [Amoxicillin-Pot Clavulanate] Nausea  Only   Citalopram Other (See Comments)    Jittery/heart racing    Hctz [Hydrochlorothiazide] Other (See Comments)    Too much urinating, felt dried out, etc--NO ALLERGY   Lexapro [Escitalopram Oxalate] Other (See Comments)    Jittery,heart racing   Lyrica [Pregabalin] Swelling    Face, hands, fingers, ankles   Promethazine Hcl    Valsartan     REACTION: pt states INTOL to Diovan    ROS As per HPI  PE:    07/08/2022    3:21 PM 06/25/2022    1:01 PM 06/21/2022    8:45 AM  Vitals with BMI  Height  _0    Weight 184 lbs 186 lbs   BMI 12.24 82.50   Systolic 037 048 889  Diastolic 74 69 63  Pulse 66 66 62     Physical Exam  ***  LABS:  Last CBC Lab Results  Component Value Date   WBC 6.9 07/08/2022   HGB 13.8 07/08/2022   HCT 41.7 07/08/2022   MCV 86.5 07/08/2022   MCH 28.6 07/08/2022   RDW 12.8 07/08/2022   PLT 238 07/08/2022   Lab Results  Component Value Date   IRON 55 07/08/2022   TIBC 377 07/08/2022   FERRITIN 167 16/94/5038   Last metabolic panel Lab Results  Component Value Date   GLUCOSE 107 (H) 07/08/2022   NA 139 07/08/2022   K 4.4 07/08/2022   CL 105 07/08/2022   CO2 24 07/08/2022   BUN 17 07/08/2022   CREATININE 0.91 07/08/2022   GFRNONAA >60 07/08/2022   CALCIUM 9.5 07/08/2022   PROT 7.1 07/08/2022   ALBUMIN 4.4 07/08/2022   LABGLOB 3.0 07/08/2022   AGRATIO 1.2 07/08/2022   BILITOT 0.4 07/08/2022   ALKPHOS 91 07/08/2022   AST 14 (L) 07/08/2022  ALT 18 07/08/2022   ANIONGAP 10 07/08/2022   IMPRESSION AND PLAN:  No problem-specific Assessment & Plan notes found for this encounter.   An After Visit Summary was printed and given to the patient.  FOLLOW UP: No follow-ups on file.  Signed:  Crissie Sickles, MD           07/23/2022

## 2022-07-29 ENCOUNTER — Other Ambulatory Visit: Payer: Self-pay

## 2022-07-29 DIAGNOSIS — M961 Postlaminectomy syndrome, not elsewhere classified: Secondary | ICD-10-CM | POA: Diagnosis not present

## 2022-07-29 DIAGNOSIS — M1712 Unilateral primary osteoarthritis, left knee: Secondary | ICD-10-CM | POA: Diagnosis not present

## 2022-07-29 DIAGNOSIS — E039 Hypothyroidism, unspecified: Secondary | ICD-10-CM

## 2022-07-29 DIAGNOSIS — K5900 Constipation, unspecified: Secondary | ICD-10-CM | POA: Diagnosis not present

## 2022-07-29 DIAGNOSIS — G894 Chronic pain syndrome: Secondary | ICD-10-CM | POA: Diagnosis not present

## 2022-07-29 MED ORDER — LEVOTHYROXINE SODIUM 75 MCG PO TABS: 75.0000 ug | ORAL_TABLET | Freq: Every day | ORAL | 0 refills | Status: DC

## 2022-07-29 NOTE — Telephone Encounter (Signed)
RF request for Levothyroxine LOV: n/a Next ov: n/a Last written: 03/31/22 (90,0) Dr.Kumar, declined refill on 11/3, pt needs o/v   30 day supply sent but she needs to schedule with Dr.Kumar for visit and repeat labs

## 2022-07-30 ENCOUNTER — Telehealth: Payer: Self-pay

## 2022-07-30 DIAGNOSIS — E039 Hypothyroidism, unspecified: Secondary | ICD-10-CM

## 2022-07-30 MED ORDER — LEVOTHYROXINE SODIUM 75 MCG PO TABS: 75.0000 ug | ORAL_TABLET | Freq: Every day | ORAL | 0 refills | Status: DC

## 2022-07-30 NOTE — Telephone Encounter (Addendum)
Rx resent to [facility]. LM for pt regarding medication. Pt has not had thyroid level checked since Feb. Please advise to schedule follow up with PCP.

## 2022-07-30 NOTE — Telephone Encounter (Signed)
Patient refill request. Patient does not see Dr. Dwyane Dee any longer.   Please change pharmacy to [facility]  levothyroxine (SYNTHROID) 75 MCG tablet [[id number]]

## 2022-08-02 NOTE — Telephone Encounter (Signed)
LM for pt regarding medication ?

## 2022-08-03 NOTE — Telephone Encounter (Signed)
Left detailed message advising refill sent. Please schedule for follow up.

## 2022-08-03 NOTE — Telephone Encounter (Signed)
Patient scheduled follow up with Dr. [name] on 11/22

## 2022-08-11 ENCOUNTER — Encounter: Payer: Self-pay | Admitting: Family Medicine

## 2022-08-11 ENCOUNTER — Ambulatory Visit (INDEPENDENT_AMBULATORY_CARE_PROVIDER_SITE_OTHER): Payer: Medicare Other | Admitting: Family Medicine

## 2022-08-11 VITALS — BP 131/84 | HR 77 | Temp 97.5°F | Wt 189.8 lb

## 2022-08-11 DIAGNOSIS — F411 Generalized anxiety disorder: Secondary | ICD-10-CM

## 2022-08-11 DIAGNOSIS — E039 Hypothyroidism, unspecified: Secondary | ICD-10-CM | POA: Diagnosis not present

## 2022-08-11 DIAGNOSIS — Z79899 Other long term (current) drug therapy: Secondary | ICD-10-CM | POA: Diagnosis not present

## 2022-08-11 NOTE — Progress Notes (Signed)
OFFICE VISIT  08/11/2022  CC: f/u anxiety  Patient is a 76 y.o. female who presents accompanied by her husband for 6 wk f/u chronic anxiety, chest wall pain, urinary incontinence. A/P as of last visit: "#1 viral bronchitis. Reassured.  Continue Tessalon Perles as needed.   #2.  Right chest wall pain, history of seventh and eighth rib fractures.  This is gradually improving.   #3 urinary incontinence. She is not sure if she is taking the Detrol that is on her med list currently. She has been seen by urology in the past and was getting tibial nerve stimulation at 1 point in time. She does not recall whether this was helping or not. We will do trial of Myrbetriq 25 mg/day.   #4 debilitation.  She is going to move forward with physical therapy for her chest wall pain and her chronic unsteady gait but wants to wait until she is over her current respiratory illness first. It sounds like she may be moving forward with left knee replacement surgery.  Orthopedics has her tapering down her opioids some in preparation for this."  INTERIM HX: Still struggling with chronic diffuse pain, especially left knee. Has a plan to get knee replacement surgery, meet with her orthopedist in 3 days. Today does not feel very well at all, very tired, achy all over. Her pain management doctor put her back up on her usual dose of oxycodone. She sometimes uses a walker for balance.  Has fallen a couple times when getting up out of bed and trying to initiate walking, sometimes hits her bedside table.  No injury.  She did not get Myrbetriq because it was too expensive.  Anxiety level severe, may be even a little worse than her baseline lately.  Pain is her biggest factor.  PMP AWARE reviewed today: most recent rx for alprazolam was filled 06/30/22, # 120, rx by me. Her pain medication is rx'd by Dr. Greta Doom. No red flags.   Past Medical History:  Diagnosis Date   Allergic rhinitis    Anxiety    Chronic  gastritis    dx'd by EGD 2018 (erosive, likely NSAID-induced). 06/2019 +gastritis.   Chronic pain syndrome    right knee osteo, hx of TKA in R knee.  Pt was told by [name] that no further surgical options are available for her; also, chronic lower back pain.--[name].   Chronic renal insufficiency, stage II (mild) 2015   vs acute fluctuations depending on hydration?---GFR from 60s up to 80s.   Diverticulosis of colon    with hx of 'itis   DJD (degenerative joint disease)    L spine and knees.  R knee inject 03/15/18, L knee inject 12/26/18 (by Dr. Lynann Bologna).   FATIGUE / MALAISE 07/01/2009   Chronic   GERD (gastroesophageal reflux disease)    History of colonic polyps    HTN (hypertension)    Hypercholesterolemia    recommended restart of simvastatin 07/2018   Hypothyroid 07/05/2012   IBS (irritable bowel syndrome)    IC (interstitial cystitis)    [name] at [facility]   Iron deficiency anemia, unspecified 06/27/2013   Has required IV iron on multiple occasions ([name]).  Hemoccults neg 12/2016. Most recent hem f/u labs normal 09/2020.   Lumbar back pain    Spinal stenosis, lumbar region with neurogenic claudication L5-S1 sx's--ESI's no help.  CT L spine stable post-surg appearance as of 12/2019   Major depression, recurrent (HCC)    vs  dysthymia,chronic   Memory impairment 10/03/2012   Memory impairment 10/03/2012 >referral to neuro     Monoclonal gammopathy of undetermined significance    IgM kappa MGUS ([name]) routine f/u has shown stability of this problem (most recent f/u 09/2020)   OSA (obstructive sleep apnea)    Latest sleep study 01/2017.  Some adjustments to CPAP being made+ dental referral for consideration of oral appliance --per pulm MD.   Osteopenia 04/2014; 03/2018   2015: T score -2.0 FRAX 11%/1.8%.  2019 FRAX 10%/1.5%.  10/2021 T score -1.6 radius, -0.9 femur   Palpitations    Psoriasis    Trochanteric bursitis of both hips    + injections  by ortho in the past   Vertigo 2020/21   6+ wks, ->to ENT->?vestib neuronitis with ongoing motion sensitivity, gradually resolving.([name] 2/44/01). MRI brain normal 09/2020.   Vitamin D deficiency    Xerostomia    and xerophthalmia--per [name] (he suggests SSA, SSB, ESR, rh factor, ANA, and antimicrosmal and antithyroglobulin ab's as of 02/17/16--all these labs were NORMAL.    Past Surgical History:  Procedure Laterality Date   ABDOMINAL HYSTERECTOMY     for prolapse with abdominal incision   BACK SURGERY  01/23/2015   Lumbar decompression with L2-L5 fusion ([name])   [place]     no PCI   CHOLECYSTECTOMY     COLONOSCOPY  04/2006;07/16/19   Diverticulosis, o/w normal.  06/2019->2 adenomas, +Diverticulosis.  Recall 2023-2025.   DEXA  04/06/2018   T score -1.5 femur neck, -2.2 radius.  FRAX 10%/1.5%.  10/2021 T score -1.6 radius, -0.9 femur. rpt 2 yrs   ESOPHAGOGASTRODUODENOSCOPY  05/29/2009; 03/01/17;07/16/19   Esoph normal.  Gastritis (H pylori NEG) + gastric polyps (benign).  Duodenal biopsy NORMAL.  2018-mild chronic gastritis, H pylori NEG. 06/2019 tortuous esophagus w/out stenosis +chronic gastritis H pyl neg.   EYE SURGERY Left    "lazy eye"   HAND SURGERY Right    TRIGGER FINGER; index finger   HERNIA REPAIR     umbilical   KNEE ARTHROSCOPY     RIGHT KNEE X 5   LAMINOTOMY  2010   L-4, L-5 FUSION   NASAL SINUS SURGERY     polypectomy (remote past)   OOPHORECTOMY  2010   Laparoscopic BSO (path benign)   PATELLA FRACTURE SURGERY  06/2007   [name]   POSTERIOR LAMINECTOMY / DECOMPRESSION LUMBAR SPINE  05/2008   [name]   TOTAL KNEE ARTHROPLASTY Right 12-07 and 4-07   [name], revision 12-09 [name] at [facility]    Outpatient Medications Prior to Visit  Medication Sig Dispense Refill   acetaminophen (TYLENOL) 325 MG tablet Take 650 mg by mouth every 6 (six) hours as needed.     ALPRAZolam (XANAX) 1 MG tablet TAKE 1 TO 2 TABLETS BY MOUTH TWICE  DAILY AS NEEDED 120 tablet 5   Azelastine HCl (ASTEPRO) 0.15 % SOLN 2 sprays each nostril every 12 hours 30 mL 11   azithromycin (ZITHROMAX) 250 MG tablet Take 1 tablet (250 mg total) by mouth daily. Take first 2 tablets together, then 1 every day until finished. 6 tablet 0   calcium carbonate (OSCAL) 1500 (600 Ca) MG TABS tablet Take by mouth daily.     Cholecalciferol (VITAMIN D3) 5000 units CAPS Take 4 capsules by mouth daily.      cyclobenzaprine (FLEXERIL) 5 MG tablet Take 5 mg by mouth at bedtime. As needed     diclofenac Sodium (  VOLTAREN) 1 % GEL Apply 4 g topically 4 (four) times daily.     doxylamine, Sleep, (UNISOM) 25 MG tablet Take 25 mg by mouth at bedtime as needed.     hydrocortisone 2.5 % cream Apply 1 application  topically as needed. RECTAL ITCHING     levothyroxine (SYNTHROID) 75 MCG tablet Take 1 tablet (75 mcg total) by mouth daily. 30 tablet 0   lidocaine (LIDODERM) 5 % Place 3 patches onto the skin daily. Remove & Discard patch within 12 hours. 30 patch 1   meclizine (ANTIVERT) 25 MG tablet TAKE 1 TABLET BY MOUTH THREE TIMES DAILY AS NEEDED FOR DIZZINESS 45 tablet 0   metoprolol tartrate (LOPRESSOR) 25 MG tablet Take 1 tablet by mouth twice daily 180 tablet 0   mirabegron ER (MYRBETRIQ) 25 MG TB24 tablet Take 1 tablet (25 mg total) by mouth daily. 30 tablet 1   Multiple Vitamins-Iron (MULTIVITAMIN/IRON PO) Take 1 tablet by mouth daily.     NIACIN PO Take by mouth daily.     omeprazole (PRILOSEC) 40 MG capsule Take 1 capsule (40 mg total) by mouth daily. 90 capsule 3   Oxycodone HCl 10 MG TABS 10 mg every 4 (four) hours as needed.     polyethylene glycol (MIRALAX / GLYCOLAX) 17 g packet Take 17 g by mouth daily.     predniSONE (DELTASONE) 20 MG tablet 2 tabs po qd x 3d, then 1 tab po qd x3d 9 tablet 0   promethazine (PHENERGAN) 25 MG tablet TAKE 1 TABLET BY MOUTH EVERY 6 HOURS AS NEEDED FOR NAUSEA AND VOMITING 90 tablet 2   simvastatin (ZOCOR) 20 MG tablet Take 1 tablet (20  mg total) by mouth at bedtime. 90 tablet 1   Specialty Vitamins Products (MAGNESIUM, AMINO ACID CHELATE,) 133 MG tablet Take 1 tablet by mouth daily.     tolterodine (DETROL LA) 4 MG 24 hr capsule Take 1 capsule (4 mg total) by mouth daily. 90 capsule 1   traZODone (DESYREL) 50 MG tablet Take 1 tablet by mouth at bedtime.  2   No facility-administered medications prior to visit.    Allergies  Allergen Reactions   Cymbalta [Duloxetine Hcl]     Facial edema   Iohexol      Code: HIVES, Desc: pt states her face and throat swells when administered IV contrast - KB, Onset Date: [id number]    Mepergan [Meperidine-Promethazine]     Cardiac arrest   Morphine Rash   Augmentin [Amoxicillin-Pot Clavulanate] Nausea Only   Citalopram Other (See Comments)    Jittery/heart racing    Hctz [Hydrochlorothiazide] Other (See Comments)    Too much urinating, felt dried out, etc--NO ALLERGY   Lexapro [Escitalopram Oxalate] Other (See Comments)    Jittery,heart racing   Lyrica [Pregabalin] Swelling    Face, hands, fingers, ankles   Promethazine Hcl    Valsartan     REACTION: pt states INTOL to Diovan    ROS As per HPI  PE:    08/11/2022    1:54 PM 07/08/2022    3:21 PM 06/25/2022    1:01 PM  Vitals with BMI  Height   _0   Weight 189 lbs 13 oz 184 lbs 186 lbs  BMI  79.02 40.97  Systolic 353 299 242  Diastolic 84 74 69  Pulse 77 66 66     Physical Exam  Gen: Alert, tired-appearing.  No distress.  Patient is oriented to person, place, time, and situation. No further exam  today.   LABS:  Last CBC Lab Results  Component Value Date   WBC 6.9 07/08/2022   HGB 13.8 07/08/2022   HCT 41.7 07/08/2022   MCV 86.5 07/08/2022   MCH 28.6 07/08/2022   RDW 12.8 07/08/2022   PLT 238 07/08/2022   Lab Results  Component Value Date   IRON 55 07/08/2022   TIBC 377 07/08/2022   FERRITIN 167 13/24/4010   Last metabolic panel Lab Results  Component Value Date   GLUCOSE 107 (H) 07/08/2022    NA 139 07/08/2022   K 4.4 07/08/2022   CL 105 07/08/2022   CO2 24 07/08/2022   BUN 17 07/08/2022   CREATININE 0.91 07/08/2022   GFRNONAA >60 07/08/2022   CALCIUM 9.5 07/08/2022   PROT 7.1 07/08/2022   ALBUMIN 4.4 07/08/2022   LABGLOB 3.0 07/08/2022   AGRATIO 1.2 07/08/2022   BILITOT 0.4 07/08/2022   ALKPHOS 91 07/08/2022   AST 14 (L) 07/08/2022   ALT 18 07/08/2022   ANIONGAP 10 07/08/2022   Last lipids Lab Results  Component Value Date   CHOL 289 (H) 10/22/2021   HDL 32.60 (L) 10/22/2021   LDLCALC 123 (H) 04/21/2021   LDLDIRECT 207.0 10/22/2021   TRIG 310.0 (H) 10/22/2021   CHOLHDL 9 10/22/2021   Last thyroid functions Lab Results  Component Value Date   TSH 0.90 11/11/2021   T3TOTAL 137 06/26/2020   IMPRESSION AND PLAN:  Chronic anxiety, history of recurrent major depressive disorder. Fibromyalgia contributing. Chronic left knee pain is severe and affecting quality of life and functioning. Continue alprazolam 1 mg tabs, 1-2 twice daily as needed.  Hopefully she will have had her knee replaced by the time I see her again.  No labs needed today.  An After Visit Summary was printed and given to the patient.  FOLLOW UP: Return in about 3 months (around 11/11/2022).  Signed:  Crissie Sickles, MD           08/11/2022

## 2022-08-14 DIAGNOSIS — M25562 Pain in left knee: Secondary | ICD-10-CM | POA: Diagnosis not present

## 2022-08-26 DIAGNOSIS — M961 Postlaminectomy syndrome, not elsewhere classified: Secondary | ICD-10-CM | POA: Diagnosis not present

## 2022-08-26 DIAGNOSIS — G894 Chronic pain syndrome: Secondary | ICD-10-CM | POA: Diagnosis not present

## 2022-08-26 DIAGNOSIS — M1712 Unilateral primary osteoarthritis, left knee: Secondary | ICD-10-CM | POA: Diagnosis not present

## 2022-08-26 DIAGNOSIS — K5900 Constipation, unspecified: Secondary | ICD-10-CM | POA: Diagnosis not present

## 2022-09-06 ENCOUNTER — Other Ambulatory Visit: Payer: Self-pay | Admitting: Family Medicine

## 2022-09-27 DIAGNOSIS — Z79891 Long term (current) use of opiate analgesic: Secondary | ICD-10-CM | POA: Diagnosis not present

## 2022-09-27 DIAGNOSIS — G894 Chronic pain syndrome: Secondary | ICD-10-CM | POA: Diagnosis not present

## 2022-09-27 DIAGNOSIS — K5900 Constipation, unspecified: Secondary | ICD-10-CM | POA: Diagnosis not present

## 2022-09-27 DIAGNOSIS — M961 Postlaminectomy syndrome, not elsewhere classified: Secondary | ICD-10-CM | POA: Diagnosis not present

## 2022-09-27 DIAGNOSIS — M1712 Unilateral primary osteoarthritis, left knee: Secondary | ICD-10-CM | POA: Diagnosis not present

## 2022-10-11 DIAGNOSIS — M545 Low back pain, unspecified: Secondary | ICD-10-CM | POA: Diagnosis not present

## 2022-10-11 DIAGNOSIS — M25562 Pain in left knee: Secondary | ICD-10-CM | POA: Diagnosis not present

## 2022-10-27 DIAGNOSIS — M1712 Unilateral primary osteoarthritis, left knee: Secondary | ICD-10-CM | POA: Diagnosis not present

## 2022-10-27 DIAGNOSIS — K5900 Constipation, unspecified: Secondary | ICD-10-CM | POA: Diagnosis not present

## 2022-10-27 DIAGNOSIS — G894 Chronic pain syndrome: Secondary | ICD-10-CM | POA: Diagnosis not present

## 2022-10-27 DIAGNOSIS — M961 Postlaminectomy syndrome, not elsewhere classified: Secondary | ICD-10-CM | POA: Diagnosis not present

## 2022-10-28 ENCOUNTER — Telehealth: Payer: Self-pay | Admitting: Family Medicine

## 2022-10-28 MED ORDER — LEVOTHYROXINE SODIUM 75 MCG PO TABS: 75.0000 ug | ORAL_TABLET | Freq: Every day | ORAL | 1 refills | Status: DC

## 2022-10-28 NOTE — Telephone Encounter (Signed)
Okay, prescription for levothyroxine sent. We can recheck her TSH when I see her for follow-up later this month.

## 2022-10-28 NOTE — Telephone Encounter (Signed)
Pt needs refill on Levothyroxinepharmacy confirmed and correct.

## 2022-10-28 NOTE — Telephone Encounter (Signed)
Pt has not had tsh checked since 11/11/21.  Please fill, if appropriate.

## 2022-10-28 NOTE — Telephone Encounter (Signed)
LVM for pt to return call.  See message below if call returned

## 2022-10-29 NOTE — Telephone Encounter (Signed)
Patient returned call.  Patient aware medication sent to pharmacy.  She is also aware we will check TSH, I have added to appt notes for appt on 2/21 w/ Dr. [name].

## 2022-10-29 NOTE — Telephone Encounter (Signed)
Noted  

## 2022-10-29 NOTE — Telephone Encounter (Signed)
LVM for pt to return call

## 2022-11-01 ENCOUNTER — Telehealth: Payer: Self-pay | Admitting: Family Medicine

## 2022-11-01 MED ORDER — METOPROLOL TARTRATE 25 MG PO TABS: 25.0000 mg | ORAL_TABLET | Freq: Two times a day (BID) | ORAL | 0 refills | Status: DC

## 2022-11-01 NOTE — Telephone Encounter (Signed)
Pt needs refill on Metoprolol until her appt on 2/21. Her pharmacy has changed to the [facility] in [place].

## 2022-11-01 NOTE — Telephone Encounter (Signed)
Rx sent 

## 2022-11-03 ENCOUNTER — Encounter: Payer: Self-pay | Admitting: Family

## 2022-11-10 ENCOUNTER — Other Ambulatory Visit: Payer: Self-pay | Admitting: Family Medicine

## 2022-11-10 ENCOUNTER — Ambulatory Visit (INDEPENDENT_AMBULATORY_CARE_PROVIDER_SITE_OTHER): Payer: Medicare Other | Admitting: Family Medicine

## 2022-11-10 ENCOUNTER — Encounter: Payer: Self-pay | Admitting: Family Medicine

## 2022-11-10 VITALS — BP 118/70 | HR 55 | Temp 97.7°F | Ht 66.0 in | Wt 189.8 lb

## 2022-11-10 DIAGNOSIS — R350 Frequency of micturition: Secondary | ICD-10-CM

## 2022-11-10 DIAGNOSIS — E039 Hypothyroidism, unspecified: Secondary | ICD-10-CM | POA: Diagnosis not present

## 2022-11-10 DIAGNOSIS — N3941 Urge incontinence: Secondary | ICD-10-CM | POA: Diagnosis not present

## 2022-11-10 DIAGNOSIS — R3915 Urgency of urination: Secondary | ICD-10-CM

## 2022-11-10 DIAGNOSIS — Z79899 Other long term (current) drug therapy: Secondary | ICD-10-CM

## 2022-11-10 DIAGNOSIS — F411 Generalized anxiety disorder: Secondary | ICD-10-CM

## 2022-11-10 DIAGNOSIS — N3001 Acute cystitis with hematuria: Secondary | ICD-10-CM

## 2022-11-10 LAB — POCT URINALYSIS DIPSTICK
Bilirubin, UA: NEGATIVE
Blood, UA: POSITIVE
Glucose, UA: NEGATIVE
Ketones, UA: NEGATIVE
Leukocytes, UA: NEGATIVE
Nitrite, UA: POSITIVE
Protein, UA: POSITIVE — AB
Spec Grav, UA: 1.025 (ref 1.010–1.025)
Urobilinogen, UA: 0.2 E.U./dL
pH, UA: 5.5 (ref 5.0–8.0)

## 2022-11-10 LAB — TSH: TSH: 1.67 u[IU]/mL (ref 0.35–5.50)

## 2022-11-10 MED ORDER — OMEPRAZOLE 40 MG PO CPDR: 40.0000 mg | DELAYED_RELEASE_CAPSULE | Freq: Every day | ORAL | 3 refills | Status: DC

## 2022-11-10 MED ORDER — ALPRAZOLAM 1 MG PO TABS: ORAL_TABLET | ORAL | 2 refills | Status: DC

## 2022-11-10 MED ORDER — SULFAMETHOXAZOLE-TRIMETHOPRIM 800-160 MG PO TABS: 1.0000 | ORAL_TABLET | Freq: Two times a day (BID) | ORAL | 0 refills | Status: DC

## 2022-11-10 MED ORDER — LEVOTHYROXINE SODIUM 75 MCG PO TABS: 75.0000 ug | ORAL_TABLET | Freq: Every day | ORAL | 1 refills | Status: DC

## 2022-11-10 NOTE — Progress Notes (Signed)
OFFICE VISIT  11/10/2022  CC: f/u anxiety and dep  Patient is a 77 y.o. female who presents accompanied by her husband [name] for 109-monthfollow-up anxiety and hypothyroidism. A/P as of last visit: "Chronic anxiety, history of recurrent major depressive disorder. Fibromyalgia contributing. Chronic left knee pain is severe and affecting quality of life and functioning. Continue alprazolam 1 mg tabs, 1-2 twice daily as needed. Hopefully she will have had her knee replaced by the time I see her again."  INTERIM HX: Doing fairly well.  Having worse urinary frequ and urg lately. Taking detrol LA but could not afford mirbetriq d/t cost. No burning.    Mood improved some lately, has a niece visiting regularly now who lifts her spirits. Chronic anxiety level pretty stable. PMP AWARE reviewed today: most recent rx for alprazolam was filled 09/07/2022, # 120, rx by me. Most recent oxycodone prescription was filled 10/29/2022, #180, prescription by Dr. MMargaretha SheffieldNo red flags.  Past Medical History:  Diagnosis Date   Allergic rhinitis    Anxiety    Chronic gastritis    dx'd by EGD 2018 (erosive, likely NSAID-induced). 06/2019 +gastritis.   Chronic pain syndrome    right knee osteo, hx of TKA in R knee.  Pt was told by [name] that no further surgical options are available for her; also, chronic lower back pain.--[name].   Chronic renal insufficiency, stage II (mild) 2015   vs acute fluctuations depending on hydration?---GFR from 60s up to 80s.   Diverticulosis of colon    with hx of 'itis   DJD (degenerative joint disease)    L spine and knees.  R knee inject 03/15/18, L knee inject 12/26/18 (by Dr. VLynann Bologna.   FATIGUE / MALAISE 07/01/2009   Chronic   GERD (gastroesophageal reflux disease)    History of colonic polyps    HTN (hypertension)    Hypercholesterolemia    recommended restart of simvastatin 07/2018   Hypothyroid 07/05/2012   IBS (irritable bowel  syndrome)    IC (interstitial cystitis)    [name] at [facility]   Iron deficiency anemia, unspecified 06/27/2013   Has required IV iron on multiple occasions (Dr. EMarin Olp.  Hemoccults neg 12/2016. Most recent hem f/u labs normal 09/2020.   Lumbar back pain    Spinal stenosis, lumbar region with neurogenic claudication L5-S1 sx's--ESI's no help.  CT L spine stable post-surg appearance as of 12/2019   Major [place], recurrent ([place]    vs dysthymia,chronic   Memory impairment 10/03/2012   Memory impairment 10/03/2012 >referral to neuro     Monoclonal gammopathy of undetermined significance    IgM kappa MGUS (Dr. EMarin Olp routine f/u has shown stability of this problem (most recent f/u 09/2020)   OSA (obstructive sleep apnea)    Latest sleep study 01/2017.  Some adjustments to CPAP being made+ dental referral for consideration of oral appliance --per pulm MD.   Osteopenia 04/2014; 03/2018   2015: T score -2.0 FRAX 11%/1.8%.  2019 FRAX 10%/1.5%.  10/2021 T score -1.6 radius, -0.9 femur   Palpitations    Psoriasis    Trochanteric bursitis of both hips    + injections by ortho in the past   Vertigo 2020/21   6+ wks, ->to ENT->?vestib neuronitis with ongoing motion sensitivity, gradually resolving.(Dr. WErik [id number]. MRI brain normal 09/2020.   Vitamin D deficiency    Xerostomia    and xerophthalmia--per Dr. WErik Obey(he suggests SSA, SSB, ESR, rh factor, ANA, and antimicrosmal and  antithyroglobulin ab's as of 02/17/16--all these labs were NORMAL.    Past Surgical History:  Procedure Laterality Date   ABDOMINAL HYSTERECTOMY     for prolapse with abdominal incision   BACK SURGERY  01/23/2015   Lumbar decompression with L2-L5 fusion ([name])   [place]     no PCI   CHOLECYSTECTOMY     COLONOSCOPY  04/2006;07/16/19   Diverticulosis, o/w normal.  06/2019->2 adenomas, +Diverticulosis.  Recall 2023-2025.   DEXA  04/06/2018   T score -1.5 femur neck, -2.2 radius.  FRAX  10%/1.5%.  10/2021 T score -1.6 radius, -0.9 femur. rpt 2 yrs   ESOPHAGOGASTRODUODENOSCOPY  05/29/2009; 03/01/17;07/16/19   Esoph normal.  Gastritis (H pylori NEG) + gastric polyps (benign).  Duodenal biopsy NORMAL.  2018-mild chronic gastritis, H pylori NEG. 06/2019 tortuous esophagus w/out stenosis +chronic gastritis H pyl neg.   EYE SURGERY Left    "lazy eye"   HAND SURGERY Right    TRIGGER FINGER; index finger   HERNIA REPAIR     umbilical   KNEE ARTHROSCOPY     RIGHT KNEE X 5   LAMINOTOMY  2010   L-4, L-5 FUSION   NASAL SINUS SURGERY     polypectomy (remote past)   OOPHORECTOMY  2010   Laparoscopic BSO (path benign)   PATELLA FRACTURE SURGERY  06/2007   [name]   POSTERIOR LAMINECTOMY / DECOMPRESSION LUMBAR SPINE  05/2008   [name]   TOTAL KNEE ARTHROPLASTY Right 12-07 and 4-07   [name], revision 12-09 [name] at [facility]    Outpatient Medications Prior to Visit  Medication Sig Dispense Refill   acetaminophen (TYLENOL) 325 MG tablet Take 650 mg by mouth every 6 (six) hours as needed.     ALPRAZolam (XANAX) 1 MG tablet TAKE 1 TO 2 TABLETS BY MOUTH TWICE DAILY AS NEEDED 120 tablet 5   Azelastine HCl (ASTEPRO) 0.15 % SOLN 2 sprays each nostril every 12 hours 30 mL 11   calcium carbonate (OSCAL) 1500 (600 Ca) MG TABS tablet Take by mouth daily.     Cholecalciferol (VITAMIN D3) 5000 units CAPS Take 4 capsules by mouth daily.      diclofenac Sodium (VOLTAREN) 1 % GEL Apply 4 g topically 4 (four) times daily.     doxylamine, Sleep, (UNISOM) 25 MG tablet Take 25 mg by mouth at bedtime as needed.     hydrocortisone 2.5 % cream Apply 1 application  topically as needed. RECTAL ITCHING     levothyroxine (SYNTHROID) 75 MCG tablet Take 1 tablet (75 mcg total) by mouth daily. 90 tablet 1   meclizine (ANTIVERT) 25 MG tablet TAKE 1 TABLET BY MOUTH THREE TIMES DAILY AS NEEDED FOR DIZZINESS 45 tablet 0   metoprolol tartrate (LOPRESSOR) 25 MG tablet Take 1 tablet (25 mg total) by mouth 2 (two)  times daily. 180 tablet 0   mirabegron ER (MYRBETRIQ) 25 MG TB24 tablet Take 1 tablet (25 mg total) by mouth daily. 30 tablet 1   Multiple Vitamins-Iron (MULTIVITAMIN/IRON PO) Take 1 tablet by mouth daily.     NIACIN PO Take by mouth daily.     Oxycodone HCl 10 MG TABS 10 mg every 4 (four) hours as needed.     polyethylene glycol (MIRALAX / GLYCOLAX) 17 g packet Take 17 g by mouth daily.     promethazine (PHENERGAN) 25 MG tablet TAKE 1 TABLET BY MOUTH EVERY 6 HOURS AS NEEDED FOR NAUSEA AND VOMITING 90 tablet 2   simvastatin (  ZOCOR) 20 MG tablet Take 1 tablet (20 mg total) by mouth at bedtime. 90 tablet 1   Specialty Vitamins Products (MAGNESIUM, AMINO ACID CHELATE,) 133 MG tablet Take 1 tablet by mouth daily.     tolterodine (DETROL LA) 4 MG 24 hr capsule Take 1 capsule (4 mg total) by mouth daily. 90 capsule 1   traZODone (DESYREL) 50 MG tablet Take 25 mg by mouth at bedtime.  2   cyclobenzaprine (FLEXERIL) 5 MG tablet Take 5 mg by mouth at bedtime. As needed (Patient not taking: Reported on 11/10/2022)     azithromycin (ZITHROMAX) 250 MG tablet Take 1 tablet (250 mg total) by mouth daily. Take first 2 tablets together, then 1 every day until finished. 6 tablet 0   lidocaine (LIDODERM) 5 % Place 3 patches onto the skin daily. Remove & Discard patch within 12 hours. (Patient not taking: Reported on 11/10/2022) 30 patch 1   omeprazole (PRILOSEC) 40 MG capsule Take 1 capsule (40 mg total) by mouth daily. 90 capsule 3   predniSONE (DELTASONE) 20 MG tablet 2 tabs po qd x 3d, then 1 tab po qd x3d 9 tablet 0   No facility-administered medications prior to visit.    Allergies  Allergen Reactions   Cymbalta [Duloxetine Hcl]     Facial edema   Iohexol      Code: HIVES, Desc: pt states her face and throat swells when administered IV contrast - KB, Onset Date: [id number]    Mepergan [Meperidine-Promethazine]     Cardiac arrest   Morphine Rash   Augmentin [Amoxicillin-Pot Clavulanate] Nausea Only    Citalopram Other (See Comments)    Jittery/heart racing    Hctz [Hydrochlorothiazide] Other (See Comments)    Too much urinating, felt dried out, etc--NO ALLERGY   Lexapro [Escitalopram Oxalate] Other (See Comments)    Jittery,heart racing   Lyrica [Pregabalin] Swelling    Face, hands, fingers, ankles   Promethazine Hcl    Valsartan     REACTION: pt states INTOL to Diovan    Review of Systems As per HPI  PE:    11/10/2022    1:04 PM 08/11/2022    1:54 PM 07/08/2022    3:21 PM  Vitals with BMI  Height 5' 6"$     Weight 189 lbs 13 oz 189 lbs 13 oz 184 lbs  BMI [id number]  [id number]  Systolic [id number]  Diastolic 70 84 74  Pulse 55 77 66   Physical Exam  Gen: Alert, well appearing.  Patient is oriented to person, place, time, and situation. AFFECT: pleasant, lucid thought and speech. No further exam today  LABS:  Last CBC Lab Results  Component Value Date   WBC 6.9 07/08/2022   HGB 13.8 07/08/2022   HCT 41.7 07/08/2022   MCV 86.5 07/08/2022   MCH 28.6 07/08/2022   RDW 12.8 07/08/2022   PLT 238 0000000   Last metabolic panel Lab Results  Component Value Date   GLUCOSE 107 (H) 07/08/2022   NA 139 07/08/2022   K 4.4 07/08/2022   CL 105 07/08/2022   CO2 24 07/08/2022   BUN 17 07/08/2022   CREATININE 0.91 07/08/2022   GFRNONAA >60 07/08/2022   CALCIUM 9.5 07/08/2022   PROT 7.1 07/08/2022   ALBUMIN 4.4 07/08/2022   LABGLOB 3.0 07/08/2022   AGRATIO 1.2 07/08/2022   BILITOT 0.4 07/08/2022   ALKPHOS 91 07/08/2022   AST 14 (L) 07/08/2022   ALT 18 07/08/2022   ANIONGAP 10 07/08/2022  Last thyroid functions Lab Results  Component Value Date   TSH 0.90 11/11/2021   T3TOTAL 137 06/26/2020   IMPRESSION AND PLAN:  #1 GAD and recurrent major depressive disorder. Intolerant of multiple antidepressant trials. She is stable on alprazolam 1 mg, 1-2 twice daily as needed, #120, refill x 1. Will do urine drug screen at next follow-up visit.  2.  Hypoth: Takes 75  mcg T4 on empty stomach w/out any other meds. TSH today.  3.  Urge incontinence.  She gets a little bit of improvement with Detrol so we will continue this. She could not afford Myrbetriq.  She will call her urologist asked for samples. She got a little bit of benefit out of tibial nerve stimulation in the past so she will talk with her urologist about this as well.  4. UTI: UA today: cloudy, +odor, nitrite pos, trace blood, SG 1.025. Bactrim DS 1 bid x 3d. Specimen sent for c/s.  An After Visit Summary was printed and given to the patient.  FOLLOW UP: Return in about 4 weeks (around 12/08/2022) for f/u anx/dep.  Signed:  Crissie Sickles, MD           11/10/2022

## 2022-11-13 LAB — URINE CULTURE
MICRO NUMBER:: 14596078
SPECIMEN QUALITY:: ADEQUATE

## 2022-11-25 ENCOUNTER — Other Ambulatory Visit (HOSPITAL_BASED_OUTPATIENT_CLINIC_OR_DEPARTMENT_OTHER): Payer: Self-pay | Admitting: Family Medicine

## 2022-11-25 DIAGNOSIS — Z1231 Encounter for screening mammogram for malignant neoplasm of breast: Secondary | ICD-10-CM

## 2022-11-25 DIAGNOSIS — G894 Chronic pain syndrome: Secondary | ICD-10-CM | POA: Diagnosis not present

## 2022-11-25 DIAGNOSIS — K5900 Constipation, unspecified: Secondary | ICD-10-CM | POA: Diagnosis not present

## 2022-11-25 DIAGNOSIS — M961 Postlaminectomy syndrome, not elsewhere classified: Secondary | ICD-10-CM | POA: Diagnosis not present

## 2022-11-25 DIAGNOSIS — M1712 Unilateral primary osteoarthritis, left knee: Secondary | ICD-10-CM | POA: Diagnosis not present

## 2022-11-27 DIAGNOSIS — M1712 Unilateral primary osteoarthritis, left knee: Secondary | ICD-10-CM | POA: Diagnosis not present

## 2022-11-30 ENCOUNTER — Inpatient Hospital Stay (HOSPITAL_BASED_OUTPATIENT_CLINIC_OR_DEPARTMENT_OTHER): Admission: RE | Admit: 2022-11-30 | Payer: Medicare Other | Source: Ambulatory Visit

## 2022-12-08 ENCOUNTER — Encounter: Payer: Self-pay | Admitting: Family Medicine

## 2022-12-08 ENCOUNTER — Ambulatory Visit (INDEPENDENT_AMBULATORY_CARE_PROVIDER_SITE_OTHER): Payer: Medicare Other | Admitting: Family Medicine

## 2022-12-08 VITALS — BP 114/76 | HR 59 | Temp 96.6°F | Ht 66.0 in | Wt 186.8 lb

## 2022-12-08 DIAGNOSIS — F4323 Adjustment disorder with mixed anxiety and depressed mood: Secondary | ICD-10-CM

## 2022-12-08 DIAGNOSIS — F411 Generalized anxiety disorder: Secondary | ICD-10-CM | POA: Diagnosis not present

## 2022-12-08 NOTE — Progress Notes (Signed)
OFFICE VISIT  12/08/2022  CC:  Chief Complaint  Patient presents with   Medical Management of Chronic Issues    Anxiety    Patient is a 77 y.o. female who presents for 1 month follow-up anxiety. A/P as of last visit: "#1 GAD and recurrent major depressive disorder. Intolerant of multiple antidepressant trials. She is stable on alprazolam 1 mg, 1-2 twice daily as needed, #120, refill x 1. Will do urine drug screen at next follow-up visit.   2.  Hypoth: Takes 75 mcg T4 on empty stomach w/out any other meds. TSH today.   3.  Urge incontinence.  She gets a little bit of improvement with Detrol so we will continue this. She could not afford Myrbetriq.  She will call her urologist asked for samples. She got a little bit of benefit out of tibial nerve stimulation in the past so she will talk with her urologist about this as well.   4. UTI: UA today: cloudy, +odor, nitrite pos, trace blood, SG 1.025. Bactrim DS 1 bid x 3d. Specimen sent for c/s."  INTERIM HX: Dealing with lots of worry and anxiety and hopelessness and depression.  All of this is worse because her husband is dying of cancer.  She is anticipating his death, dreading things.  They have been together 60+ years. Denies SI or HI.  (Of note, Urine culture last visit showed Klebsiella that was sensitive to Bactrim).   Past Medical History:  Diagnosis Date   Allergic rhinitis    Anxiety    Chronic gastritis    dx'd by EGD 2018 (erosive, likely NSAID-induced). 06/2019 +gastritis.   Chronic pain syndrome    right knee osteo, hx of TKA in R knee.  Pt was told by [name] that no further surgical options are available for her; also, chronic lower back pain.--[name].   Chronic renal insufficiency, stage II (mild) 2015   vs acute fluctuations depending on hydration?---GFR from 60s up to 80s.   Diverticulosis of colon    with hx of 'itis   DJD (degenerative joint disease)    L spine and knees.  R knee  inject 03/15/18, L knee inject 12/26/18 (by Dr. Lynann Bologna).   FATIGUE / MALAISE 07/01/2009   Chronic   GERD (gastroesophageal reflux disease)    History of colonic polyps    HTN (hypertension)    Hypercholesterolemia    recommended restart of simvastatin 07/2018   Hypothyroid 07/05/2012   IBS (irritable bowel syndrome)    IC (interstitial cystitis)    [name] at [facility]   Iron deficiency anemia, unspecified 06/27/2013   Has required IV iron on multiple occasions ([name]).  Hemoccults neg 12/2016. Most recent hem f/u labs normal 09/2020.   Lumbar back pain    Spinal stenosis, lumbar region with neurogenic claudication L5-S1 sx's--ESI's no help.  CT L spine stable post-surg appearance as of 12/2019   Major depression, recurrent ([place])    vs dysthymia,chronic   Memory impairment 10/03/2012   Memory impairment 10/03/2012 >referral to neuro     Monoclonal gammopathy of undetermined significance    IgM kappa MGUS ([name]) routine f/u has shown stability of this problem (most recent f/u 09/2020)   OSA (obstructive sleep apnea)    Latest sleep study 01/2017.  Some adjustments to CPAP being made+ dental referral for consideration of oral appliance --per pulm MD.   Osteopenia 04/2014; 03/2018   2015: T score -2.0 FRAX 11%/1.8%.  2019 FRAX 10%/1.5%.  10/2021 T score -1.6 radius, -0.9 femur   Palpitations    Psoriasis    Trochanteric bursitis of both hips    + injections by ortho in the past   Vertigo 2020/21   6+ wks, ->to ENT->?vestib neuronitis with ongoing motion sensitivity, gradually resolving.([name] [id number]). MRI brain normal 09/2020.   Vitamin D deficiency    Xerostomia    and xerophthalmia--per [name] (he suggests SSA, SSB, ESR, rh factor, ANA, and antimicrosmal and antithyroglobulin ab's as of 02/17/16--all these labs were NORMAL.    Past Surgical History:  Procedure Laterality Date   ABDOMINAL HYSTERECTOMY     for prolapse with abdominal incision   BACK SURGERY   01/23/2015   Lumbar decompression with L2-L5 fusion ([name])   [place]     no PCI   CHOLECYSTECTOMY     COLONOSCOPY  04/2006;07/16/19   Diverticulosis, o/w normal.  06/2019->2 adenomas, +Diverticulosis.  Recall 2023-2025.   DEXA  04/06/2018   T score -1.5 femur neck, -2.2 radius.  FRAX 10%/1.5%.  10/2021 T score -1.6 radius, -0.9 femur. rpt 2 yrs   ESOPHAGOGASTRODUODENOSCOPY  05/29/2009; 03/01/17;07/16/19   Esoph normal.  Gastritis (H pylori NEG) + gastric polyps (benign).  Duodenal biopsy NORMAL.  2018-mild chronic gastritis, H pylori NEG. 06/2019 tortuous esophagus w/out stenosis +chronic gastritis H pyl neg.   EYE SURGERY Left    "lazy eye"   HAND SURGERY Right    TRIGGER FINGER; index finger   HERNIA REPAIR     umbilical   KNEE ARTHROSCOPY     RIGHT KNEE X 5   LAMINOTOMY  2010   L-4, L-5 FUSION   NASAL SINUS SURGERY     polypectomy (remote past)   OOPHORECTOMY  2010   Laparoscopic BSO (path benign)   PATELLA FRACTURE SURGERY  06/2007   [name]   POSTERIOR LAMINECTOMY / DECOMPRESSION LUMBAR SPINE  05/2008   [name]   TOTAL KNEE ARTHROPLASTY Right 12-07 and 4-07   [name], revision 12-09 [name] at [facility]    Outpatient Medications Prior to Visit  Medication Sig Dispense Refill   ALPRAZolam (XANAX) 1 MG tablet TAKE 1 TO 2 TABLETS BY MOUTH TWICE DAILY AS NEEDED 120 tablet 2   calcium carbonate (OSCAL) 1500 (600 Ca) MG TABS tablet Take by mouth daily.     Cholecalciferol (VITAMIN D3) 5000 units CAPS Take 4 capsules by mouth daily.      diclofenac Sodium (VOLTAREN) 1 % GEL Apply 4 g topically 4 (four) times daily.     diphenhydrAMINE-APAP, sleep, (TYLENOL PM EXTRA STRENGTH PO) Take by mouth as needed.     doxylamine, Sleep, (UNISOM) 25 MG tablet Take 25 mg by mouth at bedtime as needed.     levothyroxine (SYNTHROID) 75 MCG tablet Take 1 tablet (75 mcg total) by mouth daily. 90 tablet 1   metoprolol tartrate (LOPRESSOR) 25 MG tablet Take 1 tablet (25 mg total)  by mouth 2 (two) times daily. 180 tablet 0   mirabegron ER (MYRBETRIQ) 25 MG TB24 tablet Take 1 tablet (25 mg total) by mouth daily. 30 tablet 1   Multiple Vitamins-Iron (MULTIVITAMIN/IRON PO) Take 1 tablet by mouth daily.     NIACIN PO Take by mouth daily.     omeprazole (PRILOSEC) 40 MG capsule Take 1 capsule (40 mg total) by mouth daily. 90 capsule 3   Oxycodone HCl 10 MG TABS 10 mg every 4 (four) hours as needed.     polyethylene glycol (MIRALAX /  GLYCOLAX) 17 g packet Take 17 g by mouth daily.     promethazine (PHENERGAN) 25 MG tablet TAKE 1 TABLET BY MOUTH EVERY 6 HOURS AS NEEDED FOR NAUSEA AND VOMITING 90 tablet 2   simvastatin (ZOCOR) 20 MG tablet Take 1 tablet (20 mg total) by mouth at bedtime. 90 tablet 1   tolterodine (DETROL LA) 4 MG 24 hr capsule Take 1 capsule (4 mg total) by mouth daily. 90 capsule 1   traZODone (DESYREL) 50 MG tablet Take 25 mg by mouth at bedtime.  2   Azelastine HCl (ASTEPRO) 0.15 % SOLN 2 sprays each nostril every 12 hours (Patient not taking: Reported on 12/08/2022) 30 mL 11   cyclobenzaprine (FLEXERIL) 5 MG tablet Take 5 mg by mouth at bedtime. As needed (Patient not taking: Reported on 11/10/2022)     hydrocortisone 2.5 % cream Apply 1 application  topically as needed. RECTAL ITCHING (Patient not taking: Reported on 12/08/2022)     meclizine (ANTIVERT) 25 MG tablet TAKE 1 TABLET BY MOUTH THREE TIMES DAILY AS NEEDED FOR DIZZINESS (Patient not taking: Reported on 12/08/2022) 45 tablet 0   acetaminophen (TYLENOL) 325 MG tablet Take 650 mg by mouth every 6 (six) hours as needed. (Patient not taking: Reported on 12/08/2022)     Specialty Vitamins Products (MAGNESIUM, AMINO ACID CHELATE,) 133 MG tablet Take 1 tablet by mouth daily. (Patient not taking: Reported on 12/08/2022)     sulfamethoxazole-trimethoprim (BACTRIM DS) 800-160 MG tablet Take 1 tablet by mouth 2 (two) times daily. 6 tablet 0   No facility-administered medications prior to visit.    Allergies   Allergen Reactions   Cymbalta [Duloxetine Hcl]     Facial edema   Iohexol      Code: HIVES, Desc: pt states her face and throat swells when administered IV contrast - KB, Onset Date: [id number]    Mepergan [Meperidine-Promethazine]     Cardiac arrest   Morphine Rash   Augmentin [Amoxicillin-Pot Clavulanate] Nausea Only   Citalopram Other (See Comments)    Jittery/heart racing    Hctz [Hydrochlorothiazide] Other (See Comments)    Too much urinating, felt dried out, etc--NO ALLERGY   Lexapro [Escitalopram Oxalate] Other (See Comments)    Jittery,heart racing   Lyrica [Pregabalin] Swelling    Face, hands, fingers, ankles   Promethazine Hcl    Valsartan     REACTION: pt states INTOL to Diovan    Review of Systems As per HPI  PE:    12/08/2022    1:28 PM 11/10/2022    1:04 PM 08/11/2022    1:54 PM  Vitals with BMI  Height 5\' 6"  5\' 6"    Weight 186 lbs 13 oz 189 lbs 13 oz 189 lbs 13 oz  BMI 0000000 [id number]   Systolic [id number]  Diastolic 76 70 84  Pulse 59 55 77     Physical Exam  General: Alert and oriented x 4.   Depressed affect, frequently tearful.  Lucid thought and speech. No further exam today.  LABS:  Last CBC Lab Results  Component Value Date   WBC 6.9 07/08/2022   HGB 13.8 07/08/2022   HCT 41.7 07/08/2022   MCV 86.5 07/08/2022   MCH 28.6 07/08/2022   RDW 12.8 07/08/2022   PLT 238 0000000   Last metabolic panel Lab Results  Component Value Date   GLUCOSE 107 (H) 07/08/2022   NA 139 07/08/2022   K 4.4 07/08/2022   CL 105 07/08/2022   CO2  24 07/08/2022   BUN 17 07/08/2022   CREATININE 0.91 07/08/2022   GFRNONAA >60 07/08/2022   CALCIUM 9.5 07/08/2022   PROT 7.1 07/08/2022   ALBUMIN 4.4 07/08/2022   LABGLOB 3.0 07/08/2022   AGRATIO 1.2 07/08/2022   BILITOT 0.4 07/08/2022   ALKPHOS 91 07/08/2022   AST 14 (L) 07/08/2022   ALT 18 07/08/2022   ANIONGAP 10 07/08/2022   Last thyroid functions Lab Results  Component Value Date   TSH 1.67  11/10/2022   T3TOTAL 137 06/26/2020   IMPRESSION AND PLAN:  #1 GAD with superimposed adjustment disorder with mixed anxious and depressed mood.  Intolerant of multiple antidepressant trials. She is stable on alprazolam 1 mg, 1-2 twice daily as needed,  Emotional support given today.  Discussed grieving, coping mechanisms, etc.  An After Visit Summary was printed and given to the patient.  FOLLOW UP: Return in about 4 weeks (around 01/05/2023) for Follow-up anxiety.  Signed:  Crissie Sickles, MD           12/08/2022

## 2022-12-16 DIAGNOSIS — M25562 Pain in left knee: Secondary | ICD-10-CM | POA: Diagnosis not present

## 2022-12-16 DIAGNOSIS — M1712 Unilateral primary osteoarthritis, left knee: Secondary | ICD-10-CM | POA: Diagnosis not present

## 2022-12-20 ENCOUNTER — Telehealth: Payer: Self-pay

## 2022-12-20 NOTE — Telephone Encounter (Signed)
LVM for pt to call office to confirm pharmacy.    [facility] - Client TELEPHONE ADVICE RECORD [facility] Patient Name: Collette STI GALL Gender: Female DOB: 08/09/1946 Age: 77 Y 15 D Return Phone Number: [phone] (Primary) Address: City/ State/ Zip: [place]  [address] Client [place] [facility] - Client Client Site [place] - Day Provider Crissie Sickles - MD Contact Type Call Who Is Calling Patient / Member / Family / Caregiver Call Type Triage / Clinical Relationship To Patient Self Return Phone Number [phone] (Primary) Chief Complaint Prescription Refill or Medication Request (non symptomatic) Reason for Call Medication Question / Request Initial Comment Caller states that she needs a medication refill. Translation No Nurse Assessment Nurse: Lenon Curt, RN, Mel[place] Date/Time (Eastern Time): 12/18/2022 1:06:27 PM Confirm and document reason for call. If symptomatic, describe symptoms. ---Caller requesting a refill of Meclizine is out, last fill 10/12/22? Pharmacy not able to give loaner dose. Vertigo. To long to be refilled. Does the patient have any new or worsening symptoms? ---Yes Will a triage be completed? ---Yes Related visit to physician within the last 2 weeks? ---Yes Does the PT have any chronic conditions? (i.e. diabetes, asthma, this includes High risk factors for pregnancy, etc.) ---Yes Is this a behavioral health or substance abuse call? ---No Guidelines Guideline Title Affirmed Question Affirmed Notes Nurse Date/Time (Eastern Time) Dizziness - Vertigo SEVERE dizziness (vertigo) (e.g., unable to walk without assistance) Lenon Curt, RN, [facility] 12/18/2022 1:09:48 PM Disp. Time Eilene Ghazi Time) Disposition Final User 12/18/2022 12:27:36 PM Send To Nurse Nicolasa Ducking, RN, [place] 12/18/2022 12:49:27 PM Attempt made - message left Lenon Curt, RN, [facility] 12/18/2022 1:10:54 PM Go to ED Now (or  PCP triage) Yes Lenon Curt, RN, Mel[place] PLEASE NOTE: All timestamps contained within this report are represented as [place] Standard Time. CONFIDENTIALTY NOTICE: This fax transmission is intended only for the addressee. It contains information that is legally privileged, confidential or otherwise protected from use or disclosure. If you are not the intended recipient, you are strictly prohibited from reviewing, disclosing, copying using or disseminating any of this information or taking any action in reliance on or regarding this information. If you have received this fax in error, please notify us immediately by telephone so that we can arrange for its return to [place]. Phone: [phone], Toll-Free: [phone], Fax: [phone] Page: 2 of 2 Call Id: [id number] Final Disposition 12/18/2022 1:10:54 PM Go to ED Now (or PCP triage) Yes Lenon Curt, RN, [facility] Lopes Disagree/Comply Comply Caller Understands Yes PreDisposition Call Doctor Care Advice Given Per Guideline GO TO ED NOW (OR PCP TRIAGE): * IF NO PCP (PRIMARY CARE PROVIDER) SECOND-LEVEL TRIAGE: You need to be seen within the next hour. Go to the [facility] at _____________ [place] as soon as you can. * Another adult should drive. CARE ADVICE given per Dizziness - Vertigo (Adult) guideline. Comments User: Erik Obey, RN Date/Time Eilene Ghazi Time): 12/18/2022 1:10:08 PM 10/12/22 last fill Referrals GO TO FACILITY UNDECIDED

## 2022-12-21 ENCOUNTER — Other Ambulatory Visit: Payer: Self-pay

## 2022-12-21 NOTE — Telephone Encounter (Signed)
LVM for pt to call office to confirm pharmacy for refill.

## 2022-12-21 NOTE — Telephone Encounter (Signed)
Patient returned call.  Please send Rx to [facility].

## 2022-12-24 DIAGNOSIS — M1712 Unilateral primary osteoarthritis, left knee: Secondary | ICD-10-CM | POA: Diagnosis not present

## 2022-12-27 DIAGNOSIS — G894 Chronic pain syndrome: Secondary | ICD-10-CM | POA: Diagnosis not present

## 2022-12-27 DIAGNOSIS — M1712 Unilateral primary osteoarthritis, left knee: Secondary | ICD-10-CM | POA: Diagnosis not present

## 2022-12-27 DIAGNOSIS — M961 Postlaminectomy syndrome, not elsewhere classified: Secondary | ICD-10-CM | POA: Diagnosis not present

## 2022-12-27 DIAGNOSIS — K5900 Constipation, unspecified: Secondary | ICD-10-CM | POA: Diagnosis not present

## 2022-12-28 ENCOUNTER — Telehealth: Payer: Self-pay | Admitting: Family Medicine

## 2022-12-28 MED ORDER — MECLIZINE HCL 25 MG PO TABS: ORAL_TABLET | ORAL | 1 refills | Status: DC

## 2022-12-28 NOTE — Telephone Encounter (Signed)
Pt advised refill sent. °

## 2022-12-28 NOTE — Telephone Encounter (Signed)
Pt reports that she has been having phone trouble and has not been able to access messages, but last spoke with Moshe Cipro and was instructed to let her know what pharmacy to send the meclizine (ANTIVERT) 25 MG tablet  to. She would like this to go  to [facility] on [address] in [place].

## 2023-01-05 ENCOUNTER — Encounter (HOSPITAL_BASED_OUTPATIENT_CLINIC_OR_DEPARTMENT_OTHER): Payer: Self-pay

## 2023-01-05 ENCOUNTER — Ambulatory Visit (HOSPITAL_BASED_OUTPATIENT_CLINIC_OR_DEPARTMENT_OTHER)
Admission: RE | Admit: 2023-01-05 | Discharge: 2023-01-05 | Disposition: A | Discharge status: Home / Self Care | Payer: Medicare Other | Source: Ambulatory Visit | Attending: Family Medicine | Admitting: Family Medicine

## 2023-01-05 DIAGNOSIS — Z1231 Encounter for screening mammogram for malignant neoplasm of breast: Secondary | ICD-10-CM | POA: Diagnosis not present

## 2023-01-07 ENCOUNTER — Encounter: Payer: Self-pay | Admitting: Family

## 2023-01-07 ENCOUNTER — Inpatient Hospital Stay (HOSPITAL_BASED_OUTPATIENT_CLINIC_OR_DEPARTMENT_OTHER): Payer: Medicare Other | Admitting: Hematology & Oncology

## 2023-01-07 ENCOUNTER — Encounter: Payer: Self-pay | Admitting: Hematology & Oncology

## 2023-01-07 ENCOUNTER — Inpatient Hospital Stay: Payer: Medicare Other | Attending: Hematology & Oncology

## 2023-01-07 VITALS — BP 153/69 | HR 72 | Temp 98.3°F | Resp 20 | Ht 66.5 in | Wt 198.0 lb

## 2023-01-07 DIAGNOSIS — D472 Monoclonal gammopathy: Secondary | ICD-10-CM | POA: Diagnosis not present

## 2023-01-07 DIAGNOSIS — D500 Iron deficiency anemia secondary to blood loss (chronic): Secondary | ICD-10-CM

## 2023-01-07 DIAGNOSIS — D509 Iron deficiency anemia, unspecified: Secondary | ICD-10-CM | POA: Diagnosis not present

## 2023-01-07 LAB — CMP (CANCER CENTER ONLY)
ALT: 13 U/L (ref 0–44)
AST: 15 U/L (ref 15–41)
Albumin: 3.9 g/dL (ref 3.5–5.0)
Alkaline Phosphatase: 81 U/L (ref 38–126)
Anion gap: 9 (ref 5–15)
BUN: 22 mg/dL (ref 8–23)
CO2: 25 mmol/L (ref 22–32)
Calcium: 9.1 mg/dL (ref 8.9–10.3)
Chloride: 104 mmol/L (ref 98–111)
Creatinine: 0.86 mg/dL (ref 0.44–1.00)
GFR, Estimated: >60 mL/min (ref >60)
Glucose, Bld: 103 mg/dL — ABNORMAL HIGH (ref 70–99)
Potassium: 4.5 mmol/L (ref 3.5–5.1)
Sodium: 138 mmol/L (ref 135–145)
Total Bilirubin: 0.4 mg/dL (ref 0.3–1.2)
Total Protein: 6.7 g/dL (ref 6.5–8.1)

## 2023-01-07 LAB — CBC WITH DIFFERENTIAL (CANCER CENTER ONLY)
Abs Immature Granulocytes: 0.04 10*3/uL (ref 0.00–0.07)
Basophils Absolute: 0 10*3/uL (ref 0.0–0.1)
Basophils Relative: 1 %
Eosinophils Absolute: 0.4 10*3/uL (ref 0.0–0.5)
Eosinophils Relative: 6 %
HCT: 39.6 % (ref 36.0–46.0)
Hemoglobin: 13.1 g/dL (ref 12.0–15.0)
Immature Granulocytes: 1 %
Lymphocytes Relative: 33 %
Lymphs Abs: 2.2 10*3/uL (ref 0.7–4.0)
MCH: 28.5 pg (ref 26.0–34.0)
MCHC: 33.1 g/dL (ref 30.0–36.0)
MCV: 86.3 fL (ref 80.0–100.0)
Monocytes Absolute: 0.5 10*3/uL (ref 0.1–1.0)
Monocytes Relative: 7 %
Neutro Abs: 3.5 10*3/uL (ref 1.7–7.7)
Neutrophils Relative %: 52 %
Platelet Count: 206 10*3/uL (ref 150–400)
RBC: 4.59 MIL/uL (ref 3.87–5.11)
RDW: 12.4 % (ref 11.5–15.5)
WBC Count: 6.6 10*3/uL (ref 4.0–10.5)
nRBC: 0 % (ref 0.0–0.2)

## 2023-01-07 LAB — LACTATE DEHYDROGENASE: LDH: 146 U/L (ref 98–192)

## 2023-01-07 LAB — FERRITIN: Ferritin: 109 ng/mL (ref 11–307)

## 2023-01-07 NOTE — Progress Notes (Signed)
Hematology and Oncology Follow Up Visit  [name] [id number] 1945/09/28 77 y.o. 10/05/2018   Principle Diagnosis:  1. IgM kappa monoclonal gammopathy of undetermined significance 2. Intermittent iron deficiency anemia  Current Therapy:   IV iron as indicated - last received in January 2021    Interim History:  [name] is here today for follow-up.  She is having her issues.  She having some leg swelling.  I think she sees her family doctor this week.  She has had some injections I think into her knee.  It does not sound like she is ever gone to have surgery.  I see her every time that she comes in with her husband.  Her husband has metastatic liver cancer.  Thankfully, he is doing pretty well.  When we last saw her back in October 2023, her ferritin was 116 with an iron saturation of 15%.  She also has IgM kappa monoclonal spike.  When we last saw her, her M spike was 0.4 g/dL.  Her IgM level was 554 mg/dL.  Her Kappa light chain was 2.4 mg/dL.  She has had no change in bowel or bladder habits.  She does have some constipation..  She has had no bleeding.  There is been no fever.  She has had no problems with COVID.  Overall, I would say her performance status is ECOG 1.   Medications:  Allergies as of 10/05/2018       Reactions   Cymbalta [duloxetine Hcl]    Facial edema   Iohexol     Code: HIVES, Desc: pt states her face and throat swells when administered IV contrast - KB, Onset Date: [id number]   Mepergan [meperidine-promethazine]    Cardiac arrest   Morphine Rash   Augmentin [amoxicillin-pot Clavulanate] Nausea Only   Citalopram Other (See Comments)   Jittery/heart racing   Hctz [hydrochlorothiazide] Other (See Comments)   Too much urinating, felt dried out, etc--NO ALLERGY   Lexapro [escitalopram Oxalate] Other (See Comments)   Jittery,heart racing   Lyrica [pregabalin] Swelling   Face, hands, fingers, ankles   Valsartan    REACTION: pt states INTOL to  Diovan        Medication List        Accurate as of October 05, 2018 12:55 PM. Always use your most recent med list.          acetaminophen 325 MG tablet Commonly known as:  TYLENOL Take 650 mg by mouth every 6 (six) hours as needed.   ALIGN 4 MG Caps Take 1 capsule (4 mg total) by mouth daily.   ALPRAZolam 0.5 MG tablet Commonly known as:  XANAX TAKE ONE-HALF TO ONE TABLET BY MOUTH THREE TIMES DAILY   Azelastine HCl 0.15 % Soln Commonly known as:  ASTEPRO 2 sprays each nostril every 12 hours   calcium carbonate 1500 (600 Ca) MG Tabs tablet Commonly known as:  OSCAL Take by mouth daily.   dicyclomine 10 MG capsule Commonly known as:  BENTYL Take 1 capsule (10 mg total) by mouth 3 (three) times daily before meals.   hydrocortisone 2.5 % cream Apply 1 application topically as needed. RECTAL ITCHING   levothyroxine 75 MCG tablet Commonly known as:  SYNTHROID, LEVOTHROID TAKE 1/2 (ONE-HALF) TABLET BY MOUTH ONCE DAILY   magnesium (amino acid chelate) 133 MG tablet Take 1 tablet by mouth daily.   meclizine 25 MG tablet Commonly known as:  ANTIVERT Take 1 tablet (25 mg total) by mouth every 6 (  six) hours as needed for dizziness.   MELATONIN PO Take by mouth.   meloxicam 7.5 MG tablet Commonly known as:  MOBIC TAKE 2 TABLETS (15 MG TOTAL) BY MOUTH DAILY.   metoprolol tartrate 25 MG tablet Commonly known as:  LOPRESSOR Take 1 tablet (25 mg total) by mouth daily.   MULTIVITAMIN/IRON PO Take 1 tablet by mouth daily.   NIACIN PO Take by mouth.   omeprazole 40 MG capsule Commonly known as:  PRILOSEC Take 1 capsule (40 mg total) by mouth daily.   Oxycodone HCl 10 MG Tabs 10 mg every 4 (four) hours as needed. 1 every 6 hours for pain   promethazine 25 MG tablet Commonly known as:  PHENERGAN TAKE ONE TABLET BY MOUTH EVERY 6 HOURS AS NEEDED FOR NAUSEA AND VOMITING   RED YEAST RICE PO Take by mouth.   simvastatin 20 MG tablet Commonly known as:   ZOCOR Take 1 tablet (20 mg total) by mouth at bedtime.   SLEEP AID 25 MG tablet Generic drug:  doxylamine (Sleep) Take 25 mg by mouth at bedtime as needed.   traZODone 50 MG tablet Commonly known as:  DESYREL Take 1 tablet by mouth at bedtime.   Vitamin D3 125 MCG (5000 UT) Caps Take 2 capsules by mouth daily.        Allergies:  Allergies  Allergen Reactions   Cymbalta [Duloxetine Hcl]     Facial edema   Iohexol      Code: HIVES, Desc: pt states her face and throat swells when administered IV contrast - KB, Onset Date: [id number]    Mepergan [Meperidine-Promethazine]     Cardiac arrest   Morphine Rash   Augmentin [Amoxicillin-Pot Clavulanate] Nausea Only   Citalopram Other (See Comments)    Jittery/heart racing    Hctz [Hydrochlorothiazide] Other (See Comments)    Too much urinating, felt dried out, etc--NO ALLERGY   Lexapro [Escitalopram Oxalate] Other (See Comments)    Jittery,heart racing   Lyrica [Pregabalin] Swelling    Face, hands, fingers, ankles   Valsartan     REACTION: pt states INTOL to Diovan    Past Medical History, Surgical history, Social history, and Family History were reviewed and updated.  Review of Systems: Review of Systems  Constitutional: Positive for malaise/fatigue.  Eyes: Positive for blurred vision.  Respiratory: Positive for wheezing.   Cardiovascular: Positive for palpitations.  Gastrointestinal: Positive for nausea.  Genitourinary: Positive for urgency.  Musculoskeletal: Positive for back pain, joint pain and myalgias.  Neurological: Positive for tingling.  Endo/Heme/Allergies: Negative.   Psychiatric/Behavioral: Negative.      Physical Exam:  weight is 174 lb (78.9 kg). Her oral temperature is 97.8 F (36.6 C). Her blood pressure is 135/52 (abnormal) and her pulse is 52 (abnormal). Her respiration is 18 and oxygen saturation is 98%.   Wt Readings from Last 3 Encounters:  10/05/18 174 lb (78.9 kg)  09/05/18 178 lb (80.7 kg)   07/24/18 174 lb 8 oz (79.2 kg)    Physical Exam Vitals signs reviewed.  HENT:     Head: Normocephalic and atraumatic.  Eyes:     Pupils: Pupils are equal, round, and reactive to light.  Neck:     Musculoskeletal: Normal range of motion.  Cardiovascular:     Rate and Rhythm: Normal rate and regular rhythm.     Heart sounds: Normal heart sounds.  Pulmonary:     Effort: Pulmonary effort is normal.     Breath sounds: Normal breath sounds.  Abdominal:     General: Bowel sounds are normal.     Palpations: Abdomen is soft.  Musculoskeletal: Normal range of motion.        General: No tenderness or deformity.  Lymphadenopathy:     Cervical: No cervical adenopathy.  Skin:    General: Skin is warm and dry.     Findings: No erythema or rash.  Neurological:     Mental Status: She is alert and oriented to person, place, and time.  Psychiatric:        Behavior: Behavior normal.        Thought Content: Thought content normal.        Judgment: Judgment normal.     Lab Results  Component Value Date   WBC 14.1 (H) 10/05/2018   HGB 14.8 10/05/2018   HCT 45.9 10/05/2018   MCV 91.4 10/05/2018   PLT 260 10/05/2018   Lab Results  Component Value Date   FERRITIN 143 04/06/2018   IRON 31 (L) 04/06/2018   TIBC 284 04/06/2018   UIBC 253 04/06/2018   IRONPCTSAT 11 (L) 04/06/2018   Lab Results  Component Value Date   RETICCTPCT 1.2 01/20/2011   RBC 5.02 10/05/2018   RETICCTABS 52.2 01/20/2011   Lab Results  Component Value Date   KPAFRELGTCHN 17.3 04/06/2018   LAMBDASER 16.6 04/06/2018   KAPLAMBRATIO 1.04 04/06/2018   Lab Results  Component Value Date   IGGSERUM 667 (L) 04/06/2018   IGA 287 04/06/2018   IGMSERUM 630 (H) 04/06/2018   Lab Results  Component Value Date   TOTALPROTELP 6.7 04/06/2018   ALBUMINELP 3.3 04/06/2018   A1GS 0.2 04/06/2018   A2GS 0.9 04/06/2018   BETS 1.0 04/06/2018   BETA2SER 0.5 06/19/2015   GAMS 1.2 04/06/2018   MSPIKE 0.5 (H) 04/06/2018    SPEI Comment 10/06/2017     Chemistry      Component Value Date/Time   NA 142 10/05/2018 1108   NA 138 03/25/2017 1333   NA 138 11/25/2016 1147   K 3.8 10/05/2018 1108   K 4.3 03/25/2017 1333   K 3.8 11/25/2016 1147   CL 103 10/05/2018 1108   CL 104 03/25/2017 1333   CL 103 01/15/2010 0835   CO2 29 10/05/2018 1108   CO2 24 03/25/2017 1333   CO2 25 11/25/2016 1147   BUN 12 10/05/2018 1108   BUN 12 03/25/2017 1333   BUN 15.3 11/25/2016 1147   CREATININE 0.93 10/05/2018 1108   CREATININE 0.76 03/25/2017 1333   CREATININE 0.8 11/25/2016 1147      Component Value Date/Time   CALCIUM 9.4 10/05/2018 1108   CALCIUM 9.5 03/25/2017 1333   CALCIUM 9.2 11/25/2016 1147   ALKPHOS 104 10/05/2018 1108   ALKPHOS 111 03/25/2017 1333   ALKPHOS 99 11/25/2016 1147   AST 11 (L) 10/05/2018 1108   AST 24 11/25/2016 1147   ALT 14 10/05/2018 1108   ALT 30 11/25/2016 1147   BILITOT 0.5 10/05/2018 1108   BILITOT 0.61 11/25/2016 1147      Impression and Plan: Ms. Drahos is a very pleasant 77 yo caucasian female with MGUS and history of iron deficiency.   From a hematologic point of view, everything looks to be doing pretty well.  We will have to see what her iron studies look like.  We we will have to see what her monoclonal spike looks like.  Again, if she ever needs surgery, up from my point of view, I do not see a  problem with this.  As always, we will get her back in 6 months.     Josph Macho, MD 1/16/202012:55 PM

## 2023-01-09 LAB — IGG, IGA, IGM
IgA: 229 mg/dL (ref 64–422)
IgG (Immunoglobin G), Serum: 706 mg/dL (ref 586–1602)
IgM (Immunoglobulin M), Srm: 489 mg/dL — ABNORMAL HIGH (ref 26–217)

## 2023-01-10 ENCOUNTER — Encounter: Payer: Self-pay | Admitting: Family Medicine

## 2023-01-10 ENCOUNTER — Ambulatory Visit (INDEPENDENT_AMBULATORY_CARE_PROVIDER_SITE_OTHER): Payer: Medicare Other | Admitting: Family Medicine

## 2023-01-10 VITALS — BP 116/77 | HR 64 | Temp 97.9°F | Ht 66.5 in | Wt 197.4 lb

## 2023-01-10 DIAGNOSIS — N3941 Urge incontinence: Secondary | ICD-10-CM

## 2023-01-10 DIAGNOSIS — F411 Generalized anxiety disorder: Secondary | ICD-10-CM | POA: Diagnosis not present

## 2023-01-10 LAB — KAPPA/LAMBDA LIGHT CHAINS
Kappa free light chain: 25.2 mg/L — ABNORMAL HIGH (ref 3.3–19.4)
Kappa, lambda light chain ratio: 1.15 (ref 0.26–1.65)
Lambda free light chains: 22 mg/L (ref 5.7–26.3)

## 2023-01-10 LAB — IRON AND IRON BINDING CAPACITY (CC-WL,HP ONLY)
Iron: 64 ug/dL (ref 28–170)
Saturation Ratios: 19 % (ref 10.4–31.8)
TIBC: 343 ug/dL (ref 250–450)
UIBC: 279 ug/dL (ref 148–442)

## 2023-01-10 MED ORDER — MIRABEGRON ER 25 MG PO TB24: 25.0000 mg | ORAL_TABLET | Freq: Every day | ORAL | 1 refills | Status: DC

## 2023-01-10 NOTE — Addendum Note (Signed)
Addended by: Jeoffrey Massed on: 01/10/2023 03:41 PM   Modules accepted: Orders

## 2023-01-10 NOTE — Progress Notes (Signed)
OFFICE VISIT  01/10/2023  CC:  Chief Complaint  Patient presents with   Anxiety    Follow up    Patient is a 77 y.o. female who presents for 1 mo f/u anxiety. A/P as of last visit: "#1 GAD with superimposed adjustment disorder with mixed anxious and depressed mood.  Intolerant of multiple antidepressant trials. She is stable on alprazolam 1 mg, 1-2 twice daily as needed,  Emotional support given today.  Discussed grieving, coping mechanisms, etc."  INTERIM HX: [name] seems to be doing pretty good lately.  She does have pretty high level constant anxiety but she is taking a good outlook on things of late.  Her husband is chronically ill and she talked about this some today.  She has not had any panic attacks. Denies any suicidal or homicidal ideation.  PMP AWARE reviewed today: most recent rx for alprazolam was filled 12/27/22, # 180, rx by me. No red flags.  Of note, she has severe chronic urge incontinence that has not improved on medications.  I have prescribed her Myrbetriq in the past but it was way too costly for her to get.  She now is miserable she wants to just pay out-of-pocket for this.  Not taking Detrol LA anymore because it did not work.  Past Medical History:  Diagnosis Date   Allergic rhinitis    Anxiety    Chronic gastritis    dx'd by EGD 2018 (erosive, likely NSAID-induced). 06/2019 +gastritis.   Chronic pain syndrome    right knee osteo, hx of TKA in R knee.  Pt was told by [name] that no further surgical options are available for her; also, chronic lower back pain.--[name].   Chronic renal insufficiency, stage II (mild) 2015   vs acute fluctuations depending on hydration?---GFR from 60s up to 80s.   Diverticulosis of colon    with hx of 'itis   DJD (degenerative joint disease)    L spine and knees.  R knee inject 03/15/18, L knee inject 12/26/18 (by Dr. Charlett Blake).   FATIGUE / MALAISE 07/01/2009   Chronic   GERD (gastroesophageal reflux  disease)    History of colonic polyps    HTN (hypertension)    Hypercholesterolemia    recommended restart of simvastatin 07/2018   Hypothyroid 07/05/2012   IBS (irritable bowel syndrome)    IC (interstitial cystitis)    [name] at [facility]   Iron deficiency anemia, unspecified 06/27/2013   Has required IV iron on multiple occasions (Dr. Myna Hidalgo).  Hemoccults neg 12/2016. Most recent hem f/u labs normal 09/2020.   Lumbar back pain    Spinal stenosis, lumbar region with neurogenic claudication L5-S1 sx's--ESI's no help.  CT L spine stable post-surg appearance as of 12/2019   Major depression, recurrent    vs dysthymia,chronic   Memory impairment 10/03/2012   Memory impairment 10/03/2012 >referral to neuro     Monoclonal gammopathy of undetermined significance    IgM kappa MGUS (Dr. Myna Hidalgo) routine f/u has shown stability of this problem (most recent f/u 09/2020)   OSA (obstructive sleep apnea)    Latest sleep study 01/2017.  Some adjustments to CPAP being made+ dental referral for consideration of oral appliance --per pulm MD.   Osteopenia 04/2014; 03/2018   2015: T score -2.0 FRAX 11%/1.8%.  2019 FRAX 10%/1.5%.  10/2021 T score -1.6 radius, -0.9 femur   Palpitations    Psoriasis    Trochanteric bursitis of both hips    + injections  by ortho in the past   Vertigo 2020/21   6+ wks, ->to ENT->?vestib neuronitis with ongoing motion sensitivity, gradually resolving.(Dr. Lazarus Salines 10/18/19). MRI brain normal 09/2020.   Vitamin D deficiency    Xerostomia    and xerophthalmia--per Dr. Lazarus Salines (he suggests SSA, SSB, ESR, rh factor, ANA, and antimicrosmal and antithyroglobulin ab's as of 02/17/16--all these labs were NORMAL.    Past Surgical History:  Procedure Laterality Date   ABDOMINAL HYSTERECTOMY     for prolapse with abdominal incision   BACK SURGERY  01/23/2015   Lumbar decompression with L2-L5 fusion (Dr. Dutch Quint)   CARDIAC CATHETERIZATION     no PCI   CHOLECYSTECTOMY     COLONOSCOPY   04/2006;07/16/19   Diverticulosis, o/w normal.  06/2019->2 adenomas, +Diverticulosis.  Recall 2023-2025.   DEXA  04/06/2018   T score -1.5 femur neck, -2.2 radius.  FRAX 10%/1.5%.  10/2021 T score -1.6 radius, -0.9 femur. rpt 2 yrs   ESOPHAGOGASTRODUODENOSCOPY  05/29/2009; 03/01/17;07/16/19   Esoph normal.  Gastritis (H pylori NEG) + gastric polyps (benign).  Duodenal biopsy NORMAL.  2018-mild chronic gastritis, H pylori NEG. 06/2019 tortuous esophagus w/out stenosis +chronic gastritis H pyl neg.   EYE SURGERY Left    "lazy eye"   HAND SURGERY Right    TRIGGER FINGER; index finger   HERNIA REPAIR     umbilical   KNEE ARTHROSCOPY     RIGHT KNEE X 5   LAMINOTOMY  2010   L-4, L-5 FUSION   NASAL SINUS SURGERY     polypectomy (remote past)   OOPHORECTOMY  2010   Laparoscopic BSO (path benign)   PATELLA FRACTURE SURGERY  06/2007   Dr. Charlann Boxer   POSTERIOR LAMINECTOMY / DECOMPRESSION LUMBAR SPINE  05/2008   Dr. Jordan Likes   TOTAL KNEE ARTHROPLASTY Right 12-07 and 4-07   Dr. Shelle Iron, revision 12-09 Dr. Eileen Stanford at [facility]    Outpatient Medications Prior to Visit  Medication Sig Dispense Refill   ALPRAZolam (XANAX) 1 MG tablet TAKE 1 TO 2 TABLETS BY MOUTH TWICE DAILY AS NEEDED 120 tablet 2   calcium carbonate (OSCAL) 1500 (600 Ca) MG TABS tablet Take by mouth daily.     Cholecalciferol (VITAMIN D3) 5000 units CAPS Take 4 capsules by mouth daily.      diclofenac Sodium (VOLTAREN) 1 % GEL Apply 4 g topically 4 (four) times daily.     diphenhydrAMINE-APAP, sleep, (TYLENOL PM EXTRA STRENGTH PO) Take by mouth as needed.     levothyroxine (SYNTHROID) 75 MCG tablet Take 1 tablet (75 mcg total) by mouth daily. 90 tablet 1   meclizine (ANTIVERT) 25 MG tablet TAKE 1 TABLET BY MOUTH THREE TIMES DAILY AS NEEDED FOR DIZZINESS 45 tablet 1   metoprolol tartrate (LOPRESSOR) 25 MG tablet Take 1 tablet (25 mg total) by mouth 2 (two) times daily. 180 tablet 0   Multiple Vitamins-Iron (MULTIVITAMIN/IRON PO) Take 1 tablet by mouth  daily.     NIACIN PO Take by mouth daily.     omeprazole (PRILOSEC) 40 MG capsule Take 1 capsule (40 mg total) by mouth daily. 90 capsule 3   Oxycodone HCl 10 MG TABS 10 mg every 4 (four) hours as needed.     polyethylene glycol (MIRALAX / GLYCOLAX) 17 g packet Take 17 g by mouth 2 (two) times daily.     simvastatin (ZOCOR) 20 MG tablet Take 1 tablet (20 mg total) by mouth at bedtime. 90 tablet 1   traZODone (DESYREL) 50 MG tablet Take 25  mg by mouth at bedtime.  2   mirabegron ER (MYRBETRIQ) 25 MG TB24 tablet Take 1 tablet (25 mg total) by mouth daily. 30 tablet 1   Azelastine HCl (ASTEPRO) 0.15 % SOLN 2 sprays each nostril every 12 hours (Patient not taking: Reported on 12/08/2022) 30 mL 11   cyclobenzaprine (FLEXERIL) 5 MG tablet Take 5 mg by mouth at bedtime. As needed (Patient not taking: Reported on 01/07/2023)     hydrocortisone 2.5 % cream Apply 1 application  topically as needed. RECTAL ITCHING (Patient not taking: Reported on 01/10/2023)     promethazine (PHENERGAN) 25 MG tablet TAKE 1 TABLET BY MOUTH EVERY 6 HOURS AS NEEDED FOR NAUSEA AND VOMITING (Patient not taking: Reported on 01/10/2023) 90 tablet 2   tolterodine (DETROL LA) 4 MG 24 hr capsule Take 1 capsule (4 mg total) by mouth daily. (Patient not taking: Reported on 01/07/2023) 90 capsule 1   No facility-administered medications prior to visit.    Allergies  Allergen Reactions   Cymbalta [Duloxetine Hcl]     Facial edema   Iohexol      Code: HIVES, Desc: pt states her face and throat swells when administered IV contrast - KB, Onset Date: [id number]    Mepergan [Meperidine-Promethazine]     Cardiac arrest   Morphine Rash   Augmentin [Amoxicillin-Pot Clavulanate] Nausea Only   Citalopram Other (See Comments)    Jittery/heart racing    Hctz [Hydrochlorothiazide] Other (See Comments)    Too much urinating, felt dried out, etc--NO ALLERGY   Lexapro [Escitalopram Oxalate] Other (See Comments)    Jittery,heart racing   Lyrica  [Pregabalin] Swelling    Face, hands, fingers, ankles   Promethazine Hcl    Valsartan     REACTION: pt states INTOL to Diovan    Review of Systems As per HPI  PE:    01/10/2023    1:42 PM 01/07/2023    3:09 PM 12/08/2022    1:28 PM  Vitals with BMI  Height 5' 6.5" 5' 6.5"   Weight 197 lbs 6 oz 198 lbs 186 lbs 13 oz  BMI 31.39 31.48 30.16  Systolic 116 153 308  Diastolic 77 69 76  Pulse 64 72 59     Physical Exam  Gen: Alert, well appearing.  Patient is oriented to person, place, time, and situation. AFFECT: pleasant, lucid thought and speech. No further exam today  LABS:  Last metabolic panel Lab Results  Component Value Date   GLUCOSE 103 (H) 01/07/2023   NA 138 01/07/2023   K 4.5 01/07/2023   CL 104 01/07/2023   CO2 25 01/07/2023   BUN 22 01/07/2023   CREATININE 0.86 01/07/2023   GFRNONAA >60 01/07/2023   CALCIUM 9.1 01/07/2023   PROT 6.7 01/07/2023   ALBUMIN 3.9 01/07/2023   LABGLOB 3.0 07/08/2022   AGRATIO 1.2 07/08/2022   BILITOT 0.4 01/07/2023   ALKPHOS 81 01/07/2023   AST 15 01/07/2023   ALT 13 01/07/2023   ANIONGAP 9 01/07/2023   Lab Results  Component Value Date   TSH 1.67 11/10/2022   Lab Results  Component Value Date   CHOL 289 (H) 10/22/2021   HDL 32.60 (L) 10/22/2021   LDLCALC 123 (H) 04/21/2021   LDLDIRECT 207.0 10/22/2021   TRIG 310.0 (H) 10/22/2021   CHOLHDL 9 10/22/2021   IMPRESSION AND PLAN:  #1 chronic anxiety.  Stable. Empathic listening and support given today. Intolerant of multiple antidepressant trials. She is stable on alprazolam 1 mg,  1-2 twice daily as needed.  #2 severe urge incontinence. Has failed antimuscarinics. Start Myrbetriq 25 mg daily.  3.  Hyperlipidemia, on Zocor 20 mg daily long-term. Last LDL was 207 over a year ago. Needs repeat lipid panel at next fasting office visit.  An After Visit Summary was printed and given to the patient.  FOLLOW UP: Return in about 4 weeks (around 02/07/2023) for  f/u anxiety.  Signed:  Santiago Bumpers, MD           01/10/2023

## 2023-01-12 ENCOUNTER — Telehealth: Payer: Self-pay

## 2023-01-12 NOTE — Telephone Encounter (Signed)
Dr. Milinda Cave called a new medication for patient this week.  Medication is over $500 with insurance.  With [facility], med is still over $400.  Please see if there is something else she can take.  Patient can be reached at [phone]

## 2023-01-12 NOTE — Telephone Encounter (Signed)
mirabegron ER (MYRBETRIQ) 25 MG TB24 tablet [[id number]]   Please advise if there is another alternative pt can try

## 2023-01-13 MED ORDER — VIBEGRON 75 MG PO TABS: 75.0000 mg | ORAL_TABLET | Freq: Every day | ORAL | 5 refills | Status: DC

## 2023-01-13 NOTE — Telephone Encounter (Signed)
Pt advised.

## 2023-01-13 NOTE — Telephone Encounter (Signed)
LVM for pt regarding alternative medication.

## 2023-01-13 NOTE — Telephone Encounter (Signed)
Just sent in rx for virebegron.  This is the last option to try.

## 2023-01-14 LAB — PROTEIN ELECTROPHORESIS, SERUM, WITH REFLEX
A/G Ratio: 1.2 (ref 0.7–1.7)
Albumin ELP: 3.5 g/dL (ref 2.9–4.4)
Alpha-1-Globulin: 0.2 g/dL (ref 0.0–0.4)
Alpha-2-Globulin: 0.8 g/dL (ref 0.4–1.0)
Beta Globulin: 1 g/dL (ref 0.7–1.3)
Gamma Globulin: 1 g/dL (ref 0.4–1.8)
Globulin, Total: 2.9 g/dL (ref 2.2–3.9)
M-Spike, %: 0.3 g/dL — ABNORMAL HIGH
SPEP Interpretation: 0
Total Protein ELP: 6.4 g/dL (ref 6.0–8.5)

## 2023-01-14 LAB — IMMUNOFIXATION REFLEX, SERUM
IgA: 255 mg/dL (ref 64–422)
IgG (Immunoglobin G), Serum: 729 mg/dL (ref 586–1602)
IgM (Immunoglobulin M), Srm: 570 mg/dL — ABNORMAL HIGH (ref 26–217)

## 2023-01-17 ENCOUNTER — Encounter: Payer: Self-pay | Admitting: *Deleted

## 2023-01-24 DIAGNOSIS — M961 Postlaminectomy syndrome, not elsewhere classified: Secondary | ICD-10-CM | POA: Diagnosis not present

## 2023-01-24 DIAGNOSIS — M1712 Unilateral primary osteoarthritis, left knee: Secondary | ICD-10-CM | POA: Diagnosis not present

## 2023-01-24 DIAGNOSIS — G894 Chronic pain syndrome: Secondary | ICD-10-CM | POA: Diagnosis not present

## 2023-01-24 DIAGNOSIS — K5900 Constipation, unspecified: Secondary | ICD-10-CM | POA: Diagnosis not present

## 2023-01-26 DIAGNOSIS — M25562 Pain in left knee: Secondary | ICD-10-CM | POA: Diagnosis not present

## 2023-01-28 ENCOUNTER — Other Ambulatory Visit: Payer: Self-pay | Admitting: Family Medicine

## 2023-02-16 ENCOUNTER — Ambulatory Visit: Payer: Medicare Other | Admitting: Family Medicine

## 2023-02-18 ENCOUNTER — Encounter: Payer: Self-pay | Admitting: Family Medicine

## 2023-02-18 ENCOUNTER — Ambulatory Visit (INDEPENDENT_AMBULATORY_CARE_PROVIDER_SITE_OTHER): Payer: Medicare Other | Admitting: Family Medicine

## 2023-02-18 VITALS — BP 122/84 | HR 80 | Wt 189.2 lb

## 2023-02-18 DIAGNOSIS — N3941 Urge incontinence: Secondary | ICD-10-CM

## 2023-02-18 DIAGNOSIS — F411 Generalized anxiety disorder: Secondary | ICD-10-CM

## 2023-02-18 DIAGNOSIS — E782 Mixed hyperlipidemia: Secondary | ICD-10-CM | POA: Diagnosis not present

## 2023-02-18 NOTE — Progress Notes (Signed)
OFFICE VISIT  03/17/2023  CC:  Chief Complaint  Patient presents with   Anxiety    4 WK F/U.     Patient is a 77 y.o. female who presents for 5 wk f/u anxiety. A/P as of last visit: "IMPRESSION AND PLAN:   #1 chronic anxiety.  Stable. Empathic listening and support given today. Intolerant of multiple antidepressant trials. She is stable on alprazolam 1 mg, 1-2 twice daily as needed.   #2 severe urge incontinence. Has failed antimuscarinics. Start Myrbetriq 25 mg daily.   3.  Hyperlipidemia, on Zocor 20 mg daily long-term. Last LDL was 207 over a year ago. Needs repeat lipid panel at next fasting office visit.".  INTERIM HX: Still dealing with chronic severe anxiety and pain.  She is constantly worried and depressed about her husband and his poor health, as well as the health of several of her family members.  She has strong faith and this helps her cope.  She does not sleep well at all.  The worst pain is in her knees and she is still seeing her chronic pain management specialist. No SI or HI.   Past Medical History:  Diagnosis Date   Allergic rhinitis    Anxiety    Chronic gastritis    dx'd by EGD 2018 (erosive, likely NSAID-induced). 06/2019 +gastritis.   Chronic pain syndrome    right knee osteo, hx of TKA in R knee.  Pt was told by [name] that no further surgical options are available for her; also, chronic lower back pain.--[name].   Chronic renal insufficiency, stage II (mild) 2015   vs acute fluctuations depending on hydration?---GFR from 60s up to 80s.   Diverticulosis of colon    with hx of 'itis   DJD (degenerative joint disease)    L spine and knees.  R knee inject 03/15/18, L knee inject 12/26/18 (by Dr. Charlett Blake).   FATIGUE / MALAISE 07/01/2009   Chronic   GERD (gastroesophageal reflux disease)    History of colonic polyps    HTN (hypertension)    Hypercholesterolemia    recommended restart of simvastatin 07/2018   Hypothyroid  07/05/2012   IBS (irritable bowel syndrome)    IC (interstitial cystitis)    [name] at [facility]   Iron deficiency anemia, unspecified 06/27/2013   Has required IV iron on multiple occasions (Dr. Myna Hidalgo).  Hemoccults neg 12/2016. Most recent hem f/u labs normal 09/2020.   Lumbar back pain    Spinal stenosis, lumbar region with neurogenic claudication L5-S1 sx's--ESI's no help.  CT L spine stable post-surg appearance as of 12/2019   Major depression, recurrent (HCC)    vs dysthymia,chronic   Memory impairment 10/03/2012   Memory impairment 10/03/2012 >referral to neuro     Monoclonal gammopathy of undetermined significance    IgM kappa MGUS (Dr. Myna Hidalgo) routine f/u has shown stability of this problem (most recent f/u 09/2020)   OSA (obstructive sleep apnea)    Latest sleep study 01/2017.  Some adjustments to CPAP being made+ dental referral for consideration of oral appliance --per pulm MD.   Osteopenia 04/2014; 03/2018   2015: T score -2.0 FRAX 11%/1.8%.  2019 FRAX 10%/1.5%.  10/2021 T score -1.6 radius, -0.9 femur   Palpitations    Psoriasis    Trochanteric bursitis of both hips    + injections by ortho in the past   Vertigo 2020/21   6+ wks, ->to ENT->?vestib neuronitis with ongoing motion sensitivity, gradually resolving.(Dr. Lazarus Salines  10/18/19). MRI brain normal 09/2020.   Vitamin D deficiency    Xerostomia    and xerophthalmia--per Dr. Lazarus Salines (he suggests SSA, SSB, ESR, rh factor, ANA, and antimicrosmal and antithyroglobulin ab's as of 02/17/16--all these labs were NORMAL.    Past Surgical History:  Procedure Laterality Date   ABDOMINAL HYSTERECTOMY     for prolapse with abdominal incision   BACK SURGERY  01/23/2015   Lumbar decompression with L2-L5 fusion (Dr. Dutch Quint)   CARDIAC CATHETERIZATION     no PCI   CHOLECYSTECTOMY     COLONOSCOPY  04/2006;07/16/19   Diverticulosis, o/w normal.  06/2019->2 adenomas, +Diverticulosis.  Recall 2023-2025.   DEXA  04/06/2018   T score -1.5  femur neck, -2.2 radius.  FRAX 10%/1.5%.  10/2021 T score -1.6 radius, -0.9 femur. rpt 2 yrs   ESOPHAGOGASTRODUODENOSCOPY  05/29/2009; 03/01/17;07/16/19   Esoph normal.  Gastritis (H pylori NEG) + gastric polyps (benign).  Duodenal biopsy NORMAL.  2018-mild chronic gastritis, H pylori NEG. 06/2019 tortuous esophagus w/out stenosis +chronic gastritis H pyl neg.   EYE SURGERY Left    "lazy eye"   HAND SURGERY Right    TRIGGER FINGER; index finger   HERNIA REPAIR     umbilical   KNEE ARTHROSCOPY     RIGHT KNEE X 5   LAMINOTOMY  2010   L-4, L-5 FUSION   NASAL SINUS SURGERY     polypectomy (remote past)   OOPHORECTOMY  2010   Laparoscopic BSO (path benign)   PATELLA FRACTURE SURGERY  06/2007   Dr. Charlann Boxer   POSTERIOR LAMINECTOMY / DECOMPRESSION LUMBAR SPINE  05/2008   Dr. Jordan Likes   TOTAL KNEE ARTHROPLASTY Right 12-07 and 4-07   Dr. Shelle Iron, revision 12-09 Dr. Eileen Stanford at [facility]    Outpatient Medications Prior to Visit  Medication Sig Dispense Refill   ALPRAZolam (XANAX) 1 MG tablet TAKE 1 TO 2 TABLETS BY MOUTH TWICE DAILY AS NEEDED 120 tablet 2   Azelastine HCl (ASTEPRO) 0.15 % SOLN 2 sprays each nostril every 12 hours 30 mL 11   calcium carbonate (OSCAL) 1500 (600 Ca) MG TABS tablet Take by mouth daily.     Cholecalciferol (VITAMIN D3) 5000 units CAPS Take 4 capsules by mouth daily.      cyclobenzaprine (FLEXERIL) 5 MG tablet Take 5 mg by mouth at bedtime. As needed     diclofenac Sodium (VOLTAREN) 1 % GEL Apply 4 g topically 4 (four) times daily.     diphenhydrAMINE-APAP, sleep, (TYLENOL PM EXTRA STRENGTH PO) Take by mouth as needed.     hydrocortisone 2.5 % cream Apply 1 application  topically as needed. RECTAL ITCHING     levothyroxine (SYNTHROID) 75 MCG tablet Take 1 tablet (75 mcg total) by mouth daily. 90 tablet 1   meclizine (ANTIVERT) 25 MG tablet TAKE 1 TABLET BY MOUTH THREE TIMES DAILY AS NEEDED FOR DIZZINESS 45 tablet 1   metoprolol tartrate (LOPRESSOR) 25 MG tablet TAKE 1 TABLET BY MOUTH  TWICE A DAY 180 tablet 2   Multiple Vitamins-Iron (MULTIVITAMIN/IRON PO) Take 1 tablet by mouth daily.     NIACIN PO Take by mouth daily.     omeprazole (PRILOSEC) 40 MG capsule Take 1 capsule (40 mg total) by mouth daily. 90 capsule 3   Oxycodone HCl 10 MG TABS 10 mg every 4 (four) hours as needed.     polyethylene glycol (MIRALAX / GLYCOLAX) 17 g packet Take 17 g by mouth 2 (two) times daily.     promethazine (  PHENERGAN) 25 MG tablet TAKE 1 TABLET BY MOUTH EVERY 6 HOURS AS NEEDED FOR NAUSEA AND VOMITING 90 tablet 2   simvastatin (ZOCOR) 20 MG tablet Take 1 tablet (20 mg total) by mouth at bedtime. 90 tablet 1   traZODone (DESYREL) 50 MG tablet Take 25 mg by mouth at bedtime. (Patient not taking: Reported on 02/18/2023)  2   Vibegron 75 MG TABS Take 1 tablet (75 mg total) by mouth daily. (Patient not taking: Reported on 02/18/2023) 30 tablet 5   No facility-administered medications prior to visit.    Allergies  Allergen Reactions   Cymbalta [Duloxetine Hcl]     Facial edema   Iohexol      Code: HIVES, Desc: pt states her face and throat swells when administered IV contrast - KB, Onset Date: [id number]    Mepergan [Meperidine-Promethazine]     Cardiac arrest   Morphine Rash   Augmentin [Amoxicillin-Pot Clavulanate] Nausea Only   Citalopram Other (See Comments)    Jittery/heart racing    Hctz [Hydrochlorothiazide] Other (See Comments)    Too much urinating, felt dried out, etc--NO ALLERGY   Lexapro [Escitalopram Oxalate] Other (See Comments)    Jittery,heart racing   Lyrica [Pregabalin] Swelling    Face, hands, fingers, ankles   Promethazine Hcl    Valsartan     REACTION: pt states INTOL to Diovan    Review of Systems As per HPI  PE:    02/18/2023    2:37 PM 01/10/2023    1:42 PM 01/07/2023    3:09 PM  Vitals with BMI  Height  5' 6.5" 5' 6.5"  Weight 189 lbs 3 oz 197 lbs 6 oz 198 lbs  BMI  31.39 31.48  Systolic 122 116 478  Diastolic 84 77 69  Pulse 80 64 72      Physical Exam  Gen: Alert, well appearing.  Patient is oriented to person, place, time, and situation. AFFECT: pleasant, lucid thought and speech.   LABS:  Last metabolic panel Lab Results  Component Value Date   GLUCOSE 103 (H) 01/07/2023   NA 138 01/07/2023   K 4.5 01/07/2023   CL 104 01/07/2023   CO2 25 01/07/2023   BUN 22 01/07/2023   CREATININE 0.86 01/07/2023   GFRNONAA >60 01/07/2023   CALCIUM 9.1 01/07/2023   PROT 6.7 01/07/2023   ALBUMIN 3.9 01/07/2023   LABGLOB 2.9 01/07/2023   AGRATIO 1.2 01/07/2023   BILITOT 0.4 01/07/2023   ALKPHOS 81 01/07/2023   AST 15 01/07/2023   ALT 13 01/07/2023   ANIONGAP 9 01/07/2023   Last lipids Lab Results  Component Value Date   CHOL 289 (H) 10/22/2021   HDL 32.60 (L) 10/22/2021   LDLCALC 123 (H) 04/21/2021   LDLDIRECT 207.0 10/22/2021   TRIG 310.0 (H) 10/22/2021   CHOLHDL 9 10/22/2021   Last thyroid functions Lab Results  Component Value Date   TSH 1.67 11/10/2022   T3TOTAL 137 06/26/2020   IMPRESSION AND PLAN:  1 chronic anxiety.  Stable. Empathic listening and support given today. Intolerant of multiple antidepressant trials. She is stable on alprazolam 1 mg, 1-2 twice daily as needed.   #2 severe urge incontinence. Has failed antimuscarinics. Vibegron  and Myrbetriq too costly. No new meds today.  3.  Hyperlipidemia, on Zocor 20 mg daily long-term. Last LDL was 207 over a year ago, triglycerides 310 at that time. Needs repeat lipid panel at next opportunity.  Spent 21 min with pt today reviewing HPI, reviewing  relevant past history, doing exam, reviewing and discussing plans.  An After Visit Summary was printed and given to the patient.  FOLLOW UP: Return in about 4 weeks (around 03/18/2023) for routine chronic illness f/u.  Signed:  Santiago Bumpers, MD           03/17/2023

## 2023-02-21 DIAGNOSIS — K5900 Constipation, unspecified: Secondary | ICD-10-CM | POA: Diagnosis not present

## 2023-02-21 DIAGNOSIS — M1712 Unilateral primary osteoarthritis, left knee: Secondary | ICD-10-CM | POA: Diagnosis not present

## 2023-02-21 DIAGNOSIS — M961 Postlaminectomy syndrome, not elsewhere classified: Secondary | ICD-10-CM | POA: Diagnosis not present

## 2023-02-21 DIAGNOSIS — G894 Chronic pain syndrome: Secondary | ICD-10-CM | POA: Diagnosis not present

## 2023-03-17 ENCOUNTER — Telehealth (INDEPENDENT_AMBULATORY_CARE_PROVIDER_SITE_OTHER): Payer: Medicare Other | Admitting: Family Medicine

## 2023-03-17 ENCOUNTER — Encounter: Payer: Self-pay | Admitting: Family Medicine

## 2023-03-17 VITALS — Wt 188.0 lb

## 2023-03-17 DIAGNOSIS — F411 Generalized anxiety disorder: Secondary | ICD-10-CM | POA: Diagnosis not present

## 2023-03-17 DIAGNOSIS — Z79899 Other long term (current) drug therapy: Secondary | ICD-10-CM

## 2023-03-17 NOTE — Progress Notes (Signed)
Virtual Visit via Video Note  I connected with [name]  on 03/17/23 at  1:40 PM EDT by a video enabled telemedicine application and verified that I am speaking with the correct person using two identifiers.  Location patient: [place] Location provider:work or home office Persons participating in the virtual visit: patient, provider  I discussed the limitations and requested verbal permission for telemedicine visit. The patient expressed understanding and agreed to proceed.  CC: 77 year old female being seen today for follow-up anxiety. A/P as of last visit: "1 chronic anxiety.  Stable. Empathic listening and support given today. Intolerant of multiple antidepressant trials. She is stable on alprazolam 1 mg, 1-2 twice daily as needed.   #2 severe urge incontinence. Has failed antimuscarinics. Vibegron  and Myrbetriq too costly. No new meds today.   3.  Hyperlipidemia, on Zocor 20 mg daily long-term. Last LDL was 207 over a year ago, triglycerides 310 at that time. Needs repeat lipid panel at next opportunity."  INTERIM HX: Ongoing anxiety, discouragement, emotional heaviness due to she and her husband's chronic medical problems.  Denies SI or HI.  PMP AWARE reviewed today: most recent rx for alprazolam 1 mg was filled 02/05/2023, # 120, rx by me. No red flags.  ROS: See pertinent positives and negatives per HPI.  Past Medical History:  Diagnosis Date   Allergic rhinitis    Anxiety    Chronic gastritis    dx'd by EGD 2018 (erosive, likely NSAID-induced). 06/2019 +gastritis.   Chronic pain syndrome    right knee osteo, hx of TKA in R knee.  Pt was told by [name] that no further surgical options are available for her; also, chronic lower back pain.--[name].   Chronic renal insufficiency, stage II (mild) 2015   vs acute fluctuations depending on hydration?---GFR from 60s up to 80s.   Diverticulosis of colon    with hx of 'itis   DJD (degenerative joint  disease)    L spine and knees.  R knee inject 03/15/18, L knee inject 12/26/18 (by Dr. Charlett Blake).   FATIGUE / MALAISE 07/01/2009   Chronic   GERD (gastroesophageal reflux disease)    History of colonic polyps    HTN (hypertension)    Hypercholesterolemia    recommended restart of simvastatin 07/2018   Hypothyroid 07/05/2012   IBS (irritable bowel syndrome)    IC (interstitial cystitis)    [name] at [facility]   Iron deficiency anemia, unspecified 06/27/2013   Has required IV iron on multiple occasions (Dr. Myna Hidalgo).  Hemoccults neg 12/2016. Most recent hem f/u labs normal 09/2020.   Lumbar back pain    Spinal stenosis, lumbar region with neurogenic claudication L5-S1 sx's--ESI's no help.  CT L spine stable post-surg appearance as of 12/2019   Major depression, recurrent (HCC)    vs dysthymia,chronic   Memory impairment 10/03/2012   Memory impairment 10/03/2012 >referral to neuro     Monoclonal gammopathy of undetermined significance    IgM kappa MGUS (Dr. Myna Hidalgo) routine f/u has shown stability of this problem (most recent f/u 09/2020)   OSA (obstructive sleep apnea)    Latest sleep study 01/2017.  Some adjustments to CPAP being made+ dental referral for consideration of oral appliance --per pulm MD.   Osteopenia 04/2014; 03/2018   2015: T score -2.0 FRAX 11%/1.8%.  2019 FRAX 10%/1.5%.  10/2021 T score -1.6 radius, -0.9 femur   Palpitations    Psoriasis    Trochanteric bursitis of both hips    +  injections by ortho in the past   Vertigo 2020/21   6+ wks, ->to ENT->?vestib neuronitis with ongoing motion sensitivity, gradually resolving.(Dr. Lazarus Salines 10/18/19). MRI brain normal 09/2020.   Vitamin D deficiency    Xerostomia    and xerophthalmia--per Dr. Lazarus Salines (he suggests SSA, SSB, ESR, rh factor, ANA, and antimicrosmal and antithyroglobulin ab's as of 02/17/16--all these labs were NORMAL.    Past Surgical History:  Procedure Laterality Date   ABDOMINAL HYSTERECTOMY     for prolapse with  abdominal incision   BACK SURGERY  01/23/2015   Lumbar decompression with L2-L5 fusion (Dr. Dutch Quint)   CARDIAC CATHETERIZATION     no PCI   CHOLECYSTECTOMY     COLONOSCOPY  04/2006;07/16/19   Diverticulosis, o/w normal.  06/2019->2 adenomas, +Diverticulosis.  Recall 2023-2025.   DEXA  04/06/2018   T score -1.5 femur neck, -2.2 radius.  FRAX 10%/1.5%.  10/2021 T score -1.6 radius, -0.9 femur. rpt 2 yrs   ESOPHAGOGASTRODUODENOSCOPY  05/29/2009; 03/01/17;07/16/19   Esoph normal.  Gastritis (H pylori NEG) + gastric polyps (benign).  Duodenal biopsy NORMAL.  2018-mild chronic gastritis, H pylori NEG. 06/2019 tortuous esophagus w/out stenosis +chronic gastritis H pyl neg.   EYE SURGERY Left    "lazy eye"   HAND SURGERY Right    TRIGGER FINGER; index finger   HERNIA REPAIR     umbilical   KNEE ARTHROSCOPY     RIGHT KNEE X 5   LAMINOTOMY  2010   L-4, L-5 FUSION   NASAL SINUS SURGERY     polypectomy (remote past)   OOPHORECTOMY  2010   Laparoscopic BSO (path benign)   PATELLA FRACTURE SURGERY  06/2007   Dr. Charlann Boxer   POSTERIOR LAMINECTOMY / DECOMPRESSION LUMBAR SPINE  05/2008   Dr. Jordan Likes   TOTAL KNEE ARTHROPLASTY Right 12-07 and 4-07   Dr. Shelle Iron, revision 12-09 Dr. Eileen Stanford at [facility]     Current Outpatient Medications:    ALPRAZolam (XANAX) 1 MG tablet, TAKE 1 TO 2 TABLETS BY MOUTH TWICE DAILY AS NEEDED, Disp: 120 tablet, Rfl: 2   Azelastine HCl (ASTEPRO) 0.15 % SOLN, 2 sprays each nostril every 12 hours, Disp: 30 mL, Rfl: 11   calcium carbonate (OSCAL) 1500 (600 Ca) MG TABS tablet, Take by mouth daily., Disp: , Rfl:    Cholecalciferol (VITAMIN D3) 5000 units CAPS, Take 4 capsules by mouth daily. , Disp: , Rfl:    cyclobenzaprine (FLEXERIL) 5 MG tablet, Take 5 mg by mouth at bedtime. As needed, Disp: , Rfl:    diclofenac Sodium (VOLTAREN) 1 % GEL, Apply 4 g topically 4 (four) times daily., Disp: , Rfl:    diphenhydrAMINE-APAP, sleep, (TYLENOL PM EXTRA STRENGTH PO), Take by mouth as needed., Disp: ,  Rfl:    hydrocortisone 2.5 % cream, Apply 1 application  topically as needed. RECTAL ITCHING, Disp: , Rfl:    levothyroxine (SYNTHROID) 75 MCG tablet, Take 1 tablet (75 mcg total) by mouth daily., Disp: 90 tablet, Rfl: 1   meclizine (ANTIVERT) 25 MG tablet, TAKE 1 TABLET BY MOUTH THREE TIMES DAILY AS NEEDED FOR DIZZINESS, Disp: 45 tablet, Rfl: 1   metoprolol tartrate (LOPRESSOR) 25 MG tablet, TAKE 1 TABLET BY MOUTH TWICE A DAY, Disp: 180 tablet, Rfl: 2   Multiple Vitamins-Iron (MULTIVITAMIN/IRON PO), Take 1 tablet by mouth daily., Disp: , Rfl:    NIACIN PO, Take by mouth daily., Disp: , Rfl:    omeprazole (PRILOSEC) 40 MG capsule, Take 1 capsule (40 mg total) by mouth  daily., Disp: 90 capsule, Rfl: 3   Oxycodone HCl 10 MG TABS, 10 mg every 4 (four) hours as needed., Disp: , Rfl:    polyethylene glycol (MIRALAX / GLYCOLAX) 17 g packet, Take 17 g by mouth 2 (two) times daily., Disp: , Rfl:    promethazine (PHENERGAN) 25 MG tablet, TAKE 1 TABLET BY MOUTH EVERY 6 HOURS AS NEEDED FOR NAUSEA AND VOMITING, Disp: 90 tablet, Rfl: 2   simvastatin (ZOCOR) 20 MG tablet, Take 1 tablet (20 mg total) by mouth at bedtime., Disp: 90 tablet, Rfl: 1  EXAM:  VITALS per patient if applicable:     02/18/2023    2:37 PM 01/10/2023    1:42 PM 01/07/2023    3:09 PM  Vitals with BMI  Height  5' 6.5" 5' 6.5"  Weight 189 lbs 3 oz 197 lbs 6 oz 198 lbs  BMI  31.39 31.48  Systolic 122 116 657  Diastolic 84 77 69  Pulse 80 64 72     GENERAL: alert, oriented, appears well and in no acute distress  HEENT: atraumatic, conjunttiva clear, no obvious abnormalities on inspection of external nose and ears  NECK: normal movements of the head and neck  LUNGS: on inspection no signs of respiratory distress, breathing rate appears normal, no obvious gross SOB, gasping or wheezing  CV: no obvious cyanosis  MS: moves all visible extremities without noticeable abnormality  PSYCH/NEURO: pleasant and cooperative, no obvious  depression or anxiety, speech and thought processing grossly intact  LABS: none today    Chemistry      Component Value Date/Time   NA 138 01/07/2023 1439   NA 138 03/25/2017 1333   NA 138 11/25/2016 1147   K 4.5 01/07/2023 1439   K 4.3 03/25/2017 1333   K 3.8 11/25/2016 1147   CL 104 01/07/2023 1439   CL 104 03/25/2017 1333   CL 103 01/15/2010 0835   CO2 25 01/07/2023 1439   CO2 24 03/25/2017 1333   CO2 25 11/25/2016 1147   BUN 22 01/07/2023 1439   BUN 12 03/25/2017 1333   BUN 15.3 11/25/2016 1147   CREATININE 0.86 01/07/2023 1439   CREATININE 0.76 03/25/2017 1333   CREATININE 0.8 11/25/2016 1147      Component Value Date/Time   CALCIUM 9.1 01/07/2023 1439   CALCIUM 9.5 03/25/2017 1333   CALCIUM 9.2 11/25/2016 1147   ALKPHOS 81 01/07/2023 1439   ALKPHOS 111 03/25/2017 1333   ALKPHOS 99 11/25/2016 1147   AST 15 01/07/2023 1439   AST 24 11/25/2016 1147   ALT 13 01/07/2023 1439   ALT 30 11/25/2016 1147   BILITOT 0.4 01/07/2023 1439   BILITOT 0.61 11/25/2016 1147     ASSESSMENT AND PLAN:  Discussed the following assessment and plan:  chronic anxiety.  Stable. Empathic listening and support given today. Intolerant of multiple antidepressant trials. She is stable on alprazolam 1 mg, 1-2 twice daily as needed.   I discussed the assessment and treatment plan with the patient. The patient was provided an opportunity to ask questions and all were answered. The patient agreed with the plan and demonstrated an understanding of the instructions.   F/u: 1 month  Signed:  Santiago Bumpers, MD           03/17/2023

## 2023-03-21 DIAGNOSIS — M961 Postlaminectomy syndrome, not elsewhere classified: Secondary | ICD-10-CM | POA: Diagnosis not present

## 2023-03-21 DIAGNOSIS — K5900 Constipation, unspecified: Secondary | ICD-10-CM | POA: Diagnosis not present

## 2023-03-21 DIAGNOSIS — M1712 Unilateral primary osteoarthritis, left knee: Secondary | ICD-10-CM | POA: Diagnosis not present

## 2023-03-21 DIAGNOSIS — G894 Chronic pain syndrome: Secondary | ICD-10-CM | POA: Diagnosis not present

## 2023-03-23 ENCOUNTER — Other Ambulatory Visit (HOSPITAL_COMMUNITY): Payer: Self-pay | Admitting: Physical Medicine and Rehabilitation

## 2023-03-23 DIAGNOSIS — M5416 Radiculopathy, lumbar region: Secondary | ICD-10-CM

## 2023-04-03 ENCOUNTER — Ambulatory Visit (HOSPITAL_COMMUNITY)
Admission: RE | Admit: 2023-04-03 | Discharge: 2023-04-03 | Disposition: A | Discharge status: Home / Self Care | Payer: Medicare Other | Source: Ambulatory Visit | Attending: Physical Medicine and Rehabilitation | Admitting: Physical Medicine and Rehabilitation

## 2023-04-03 DIAGNOSIS — M4317 Spondylolisthesis, lumbosacral region: Secondary | ICD-10-CM | POA: Diagnosis not present

## 2023-04-03 DIAGNOSIS — M5416 Radiculopathy, lumbar region: Secondary | ICD-10-CM | POA: Insufficient documentation

## 2023-04-03 DIAGNOSIS — M5116 Intervertebral disc disorders with radiculopathy, lumbar region: Secondary | ICD-10-CM | POA: Diagnosis not present

## 2023-04-03 DIAGNOSIS — M48061 Spinal stenosis, lumbar region without neurogenic claudication: Secondary | ICD-10-CM | POA: Diagnosis not present

## 2023-04-03 DIAGNOSIS — M4727 Other spondylosis with radiculopathy, lumbosacral region: Secondary | ICD-10-CM | POA: Diagnosis not present

## 2023-04-03 MED ORDER — GADOBUTROL 1 MMOL/ML IV SOLN
8.5000 mL | Freq: Once | INTRAVENOUS | Status: AC | PRN
  Administered 2023-04-03: 8.5 mL via INTRAVENOUS

## 2023-04-18 DIAGNOSIS — K5900 Constipation, unspecified: Secondary | ICD-10-CM | POA: Diagnosis not present

## 2023-04-18 DIAGNOSIS — G894 Chronic pain syndrome: Secondary | ICD-10-CM | POA: Diagnosis not present

## 2023-04-18 DIAGNOSIS — Z79891 Long term (current) use of opiate analgesic: Secondary | ICD-10-CM | POA: Diagnosis not present

## 2023-04-18 DIAGNOSIS — M1712 Unilateral primary osteoarthritis, left knee: Secondary | ICD-10-CM | POA: Diagnosis not present

## 2023-04-18 DIAGNOSIS — M961 Postlaminectomy syndrome, not elsewhere classified: Secondary | ICD-10-CM | POA: Diagnosis not present

## 2023-04-20 ENCOUNTER — Encounter (INDEPENDENT_AMBULATORY_CARE_PROVIDER_SITE_OTHER): Payer: Self-pay

## 2023-05-02 ENCOUNTER — Other Ambulatory Visit: Payer: Self-pay

## 2023-05-02 MED ORDER — SIMVASTATIN 20 MG PO TABS: 20.0000 mg | ORAL_TABLET | Freq: Every day | ORAL | 3 refills | Status: DC

## 2023-05-02 NOTE — Telephone Encounter (Signed)
Last RF 03/31/22 (90,1), last lipid check 10/21/21.   Please fill, if appropriate.

## 2023-05-02 NOTE — Telephone Encounter (Signed)
Prescription Request  05/02/2023  LOV: Visit date not found  What is the name of the medication or equipment? simvastatin (ZOCOR) 20 MG tablet   Have you contacted your pharmacy to request a refill? No   Which pharmacy would you like this sent to?  [facility] [address], [place] - [address]  [place] [address] Phone: [phone] Fax: [phone]  [id number] - [place], [place] - [address] [place] [address] Phone: [phone] Fax: [phone]    Patient notified that their request is being sent to the clinical staff for review and that they should receive a response within 2 business days.   Please advise at [facility] [phone]

## 2023-05-03 NOTE — Telephone Encounter (Signed)
LM for pt regarding medication.  Note: new rx sent for Simvastatin

## 2023-05-16 DIAGNOSIS — M1712 Unilateral primary osteoarthritis, left knee: Secondary | ICD-10-CM | POA: Diagnosis not present

## 2023-05-16 DIAGNOSIS — M961 Postlaminectomy syndrome, not elsewhere classified: Secondary | ICD-10-CM | POA: Diagnosis not present

## 2023-05-16 DIAGNOSIS — K5900 Constipation, unspecified: Secondary | ICD-10-CM | POA: Diagnosis not present

## 2023-05-16 DIAGNOSIS — G894 Chronic pain syndrome: Secondary | ICD-10-CM | POA: Diagnosis not present

## 2023-05-23 ENCOUNTER — Other Ambulatory Visit: Payer: Self-pay | Admitting: Family Medicine

## 2023-05-24 NOTE — Telephone Encounter (Signed)
Requesting: ALPRAZolam (XANAX) 1 MG tablet  Contract: 03/18/21 UDS: 03/18/21 Last Visit: 03/17/23 Next Visit: N/A Last Refill: 11/10/22 (120,2)  Please Advise. Med pending

## 2023-06-06 ENCOUNTER — Ambulatory Visit (INDEPENDENT_AMBULATORY_CARE_PROVIDER_SITE_OTHER): Payer: Medicare Other | Admitting: Family Medicine

## 2023-06-06 ENCOUNTER — Encounter: Payer: Self-pay | Admitting: Family Medicine

## 2023-06-06 VITALS — BP 126/70 | HR 64 | Wt 179.6 lb

## 2023-06-06 DIAGNOSIS — F4323 Adjustment disorder with mixed anxiety and depressed mood: Secondary | ICD-10-CM

## 2023-06-06 DIAGNOSIS — F411 Generalized anxiety disorder: Secondary | ICD-10-CM | POA: Diagnosis not present

## 2023-06-06 MED ORDER — SIMVASTATIN 20 MG PO TABS: 20.0000 mg | ORAL_TABLET | Freq: Every day | ORAL | 3 refills | Status: DC

## 2023-06-06 NOTE — Progress Notes (Signed)
OFFICE VISIT  06/06/2023  CC:  Chief Complaint  Patient presents with   Anxiety   Patient is a 77 y.o. female who presents for follow-up anxiety.  INTERIM HX: Feeling overwhelmed. Husband's cancer continues to progress and debilitate him.  Understandably this is very hard for her to watch. He sleeps in the daytime and is up in the nighttime.  She hardly ever gets to sleep. She has a couple of family members who seem to rely on her for their needs as well. Denies SI or HI. Her chronic pain continues to be debilitating.  Past Medical History:  Diagnosis Date   Allergic rhinitis    Anxiety    Chronic gastritis    dx'd by EGD 2018 (erosive, likely NSAID-induced). 06/2019 +gastritis.   Chronic pain syndrome    right knee osteo, hx of TKA in R knee.  Pt was told by [name] that no further surgical options are available for her; also, chronic lower back pain.--[name].   Chronic renal insufficiency, stage II (mild) 2015   vs acute fluctuations depending on hydration?---GFR from 60s up to 80s.   Diverticulosis of colon    with hx of 'itis   DJD (degenerative joint disease)    L spine and knees.  R knee inject 03/15/18, L knee inject 12/26/18 (by Dr. Charlett Blake).   FATIGUE / MALAISE 07/01/2009   Chronic   GERD (gastroesophageal reflux disease)    History of colonic polyps    HTN (hypertension)    Hypercholesterolemia    recommended restart of simvastatin 07/2018   Hypothyroid 07/05/2012   IBS (irritable bowel syndrome)    IC (interstitial cystitis)    [name] at [facility]   Iron deficiency anemia, unspecified 06/27/2013   Has required IV iron on multiple occasions (Dr. Myna Hidalgo).  Hemoccults neg 12/2016. Most recent hem f/u labs normal 09/2020.   Lumbar back pain    Spinal stenosis, lumbar region with neurogenic claudication L5-S1 sx's--ESI's no help.  CT L spine stable post-surg appearance as of 12/2019   Major depression, recurrent (HCC)    vs dysthymia,chronic    Memory impairment 10/03/2012   Memory impairment 10/03/2012 >referral to neuro     Monoclonal gammopathy of undetermined significance    IgM kappa MGUS (Dr. Myna Hidalgo) routine f/u has shown stability of this problem (most recent f/u 09/2020)   OSA (obstructive sleep apnea)    Latest sleep study 01/2017.  Some adjustments to CPAP being made+ dental referral for consideration of oral appliance --per pulm MD.   Osteopenia 04/2014; 03/2018   2015: T score -2.0 FRAX 11%/1.8%.  2019 FRAX 10%/1.5%.  10/2021 T score -1.6 radius, -0.9 femur   Palpitations    Psoriasis    Trochanteric bursitis of both hips    + injections by ortho in the past   Vertigo 2020/21   6+ wks, ->to ENT->?vestib neuronitis with ongoing motion sensitivity, gradually resolving.(Dr. Lazarus Salines 10/18/19). MRI brain normal 09/2020.   Vitamin D deficiency    Xerostomia    and xerophthalmia--per Dr. Lazarus Salines (he suggests SSA, SSB, ESR, rh factor, ANA, and antimicrosmal and antithyroglobulin ab's as of 02/17/16--all these labs were NORMAL.    Past Surgical History:  Procedure Laterality Date   ABDOMINAL HYSTERECTOMY     for prolapse with abdominal incision   BACK SURGERY  01/23/2015   Lumbar decompression with L2-L5 fusion (Dr. Dutch Quint)   CARDIAC CATHETERIZATION     no PCI   CHOLECYSTECTOMY     COLONOSCOPY  04/2006;07/16/19   Diverticulosis, o/w normal.  06/2019->2 adenomas, +Diverticulosis.  Recall 2023-2025.   DEXA  04/06/2018   T score -1.5 femur neck, -2.2 radius.  FRAX 10%/1.5%.  10/2021 T score -1.6 radius, -0.9 femur. rpt 2 yrs   ESOPHAGOGASTRODUODENOSCOPY  05/29/2009; 03/01/17;07/16/19   Esoph normal.  Gastritis (H pylori NEG) + gastric polyps (benign).  Duodenal biopsy NORMAL.  2018-mild chronic gastritis, H pylori NEG. 06/2019 tortuous esophagus w/out stenosis +chronic gastritis H pyl neg.   EYE SURGERY Left    "lazy eye"   HAND SURGERY Right    TRIGGER FINGER; index finger   HERNIA REPAIR     umbilical   KNEE ARTHROSCOPY      RIGHT KNEE X 5   LAMINOTOMY  2010   L-4, L-5 FUSION   NASAL SINUS SURGERY     polypectomy (remote past)   OOPHORECTOMY  2010   Laparoscopic BSO (path benign)   PATELLA FRACTURE SURGERY  06/2007   Dr. Charlann Boxer   POSTERIOR LAMINECTOMY / DECOMPRESSION LUMBAR SPINE  05/2008   Dr. Jordan Likes   TOTAL KNEE ARTHROPLASTY Right 12-07 and 4-07   Dr. Shelle Iron, revision 12-09 Dr. Eileen Stanford at [facility]    Outpatient Medications Prior to Visit  Medication Sig Dispense Refill   ALPRAZolam (XANAX) 1 MG tablet TAKE 1 TO 2 TABLETS BY MOUTH TWICE DAILY AS NEEDED 120 tablet 5   Azelastine HCl (ASTEPRO) 0.15 % SOLN 2 sprays each nostril every 12 hours 30 mL 11   calcium carbonate (OSCAL) 1500 (600 Ca) MG TABS tablet Take by mouth daily.     Cholecalciferol (VITAMIN D3) 5000 units CAPS Take 4 capsules by mouth daily.      diclofenac Sodium (VOLTAREN) 1 % GEL Apply 4 g topically 4 (four) times daily.     diphenhydrAMINE-APAP, sleep, (TYLENOL PM EXTRA STRENGTH PO) Take by mouth as needed.     hydrocortisone 2.5 % cream Apply 1 application  topically as needed. RECTAL ITCHING     levothyroxine (SYNTHROID) 75 MCG tablet Take 1 tablet (75 mcg total) by mouth daily. 90 tablet 1   meclizine (ANTIVERT) 25 MG tablet TAKE 1 TABLET BY MOUTH THREE TIMES DAILY AS NEEDED FOR DIZZINESS 45 tablet 1   metoprolol tartrate (LOPRESSOR) 25 MG tablet TAKE 1 TABLET BY MOUTH TWICE A DAY 180 tablet 2   Multiple Vitamins-Iron (MULTIVITAMIN/IRON PO) Take 1 tablet by mouth daily.     NIACIN PO Take by mouth daily.     omeprazole (PRILOSEC) 40 MG capsule Take 1 capsule (40 mg total) by mouth daily. 90 capsule 3   Oxycodone HCl 10 MG TABS 10 mg every 4 (four) hours as needed.     polyethylene glycol (MIRALAX / GLYCOLAX) 17 g packet Take 17 g by mouth 2 (two) times daily.     promethazine (PHENERGAN) 25 MG tablet TAKE 1 TABLET BY MOUTH EVERY 6 HOURS AS NEEDED FOR NAUSEA AND VOMITING 90 tablet 2   simvastatin (ZOCOR) 20 MG tablet Take 1 tablet (20 mg total)  by mouth at bedtime. 90 tablet 3   cyclobenzaprine (FLEXERIL) 5 MG tablet Take 5 mg by mouth at bedtime. As needed (Patient not taking: Reported on 06/06/2023)     No facility-administered medications prior to visit.    Allergies  Allergen Reactions   Cymbalta [Duloxetine Hcl]     Facial edema   Iohexol      Code: HIVES, Desc: pt states her face and throat swells when administered IV contrast - KB, Onset  Date: [id number]    Mepergan [Meperidine-Promethazine]     Cardiac arrest   Morphine Rash   Augmentin [Amoxicillin-Pot Clavulanate] Nausea Only   Citalopram Other (See Comments)    Jittery/heart racing    Hctz [Hydrochlorothiazide] Other (See Comments)    Too much urinating, felt dried out, etc--NO ALLERGY   Lexapro [Escitalopram Oxalate] Other (See Comments)    Jittery,heart racing   Lyrica [Pregabalin] Swelling    Face, hands, fingers, ankles   Promethazine Hcl    Valsartan     REACTION: pt states INTOL to Diovan    Review of Systems As per HPI  PE:    06/06/2023    1:35 PM 03/17/2023    1:35 PM 02/18/2023    2:37 PM  Vitals with BMI  Weight 179 lbs 10 oz 188 lbs 189 lbs 3 oz  Systolic 126  122  Diastolic 70  84  Pulse 64  80     Physical Exam  General: Alert, oriented x 4.  Lucid thought and speech. She cries most of the visit.  LABS:  Last metabolic panel Lab Results  Component Value Date   GLUCOSE 103 (H) 01/07/2023   NA 138 01/07/2023   K 4.5 01/07/2023   CL 104 01/07/2023   CO2 25 01/07/2023   BUN 22 01/07/2023   CREATININE 0.86 01/07/2023   GFRNONAA >60 01/07/2023   CALCIUM 9.1 01/07/2023   PROT 6.7 01/07/2023   ALBUMIN 3.9 01/07/2023   LABGLOB 2.9 01/07/2023   AGRATIO 1.2 01/07/2023   BILITOT 0.4 01/07/2023   ALKPHOS 81 01/07/2023   AST 15 01/07/2023   ALT 13 01/07/2023   ANIONGAP 9 01/07/2023     Lab Results  Component Value Date   TSH 1.67 11/10/2022    IMPRESSION AND PLAN:  GAD with superimposed adjustment disorder with mixed  anxious and depressed mood. Struggling.   Empathic listening today. No changes. She knows she can come back anytime she needs to.  Otherwise we will see her back in a month to check in. Continue alprazolam 1 mg, 1-2 twice daily as needed. Spent 25 min with pt today reviewing HPI, reviewing relevant past history, doing exam, reviewing and discussing lab and imaging data, and formulating plans.  An After Visit Summary was printed and given to the patient.  FOLLOW UP: Return in about 4 weeks (around 07/04/2023) for f/u anx.  Signed:  Santiago Bumpers, MD           06/06/2023

## 2023-06-07 DIAGNOSIS — N3946 Mixed incontinence: Secondary | ICD-10-CM | POA: Diagnosis not present

## 2023-06-07 DIAGNOSIS — R350 Frequency of micturition: Secondary | ICD-10-CM | POA: Diagnosis not present

## 2023-06-13 DIAGNOSIS — M1712 Unilateral primary osteoarthritis, left knee: Secondary | ICD-10-CM | POA: Diagnosis not present

## 2023-06-13 DIAGNOSIS — M25572 Pain in left ankle and joints of left foot: Secondary | ICD-10-CM | POA: Diagnosis not present

## 2023-07-04 ENCOUNTER — Encounter: Payer: Self-pay | Admitting: Family Medicine

## 2023-07-04 ENCOUNTER — Ambulatory Visit: Payer: Medicare Other | Admitting: Family Medicine

## 2023-07-04 VITALS — BP 123/80 | HR 60 | Wt 186.6 lb

## 2023-07-04 DIAGNOSIS — F411 Generalized anxiety disorder: Secondary | ICD-10-CM | POA: Diagnosis not present

## 2023-07-04 DIAGNOSIS — L659 Nonscarring hair loss, unspecified: Secondary | ICD-10-CM

## 2023-07-04 DIAGNOSIS — E039 Hypothyroidism, unspecified: Secondary | ICD-10-CM | POA: Diagnosis not present

## 2023-07-04 DIAGNOSIS — R6889 Other general symptoms and signs: Secondary | ICD-10-CM

## 2023-07-04 DIAGNOSIS — Z79899 Other long term (current) drug therapy: Secondary | ICD-10-CM | POA: Diagnosis not present

## 2023-07-04 MED ORDER — LEVOTHYROXINE SODIUM 75 MCG PO TABS: 75.0000 ug | ORAL_TABLET | Freq: Every day | ORAL | 1 refills | Status: DC

## 2023-07-04 NOTE — Progress Notes (Signed)
OFFICE VISIT  07/04/2023  CC:  Chief Complaint  Patient presents with   Anxiety    F/U. Pt states things are not any better since the last visit. Pt mentions wanting to possibly get TSH; c/o of hair loss     Patient is a 77 y.o. female who presents for 1 month follow-up anxiety and depression. A/P as of last visit: "GAD with superimposed adjustment disorder with mixed anxious and depressed mood. Struggling.   Empathic listening today. No changes. She knows she can come back anytime she needs to.  Otherwise we will see her back in a month to check in. Continue alprazolam 1 mg, 1-2 twice daily as needed. Spent 25 min with pt today reviewing HPI, reviewing relevant past history, doing exam, reviewing and discussing lab and imaging data, and formulating plans."  INTERIM HX: She is in better spirits today, although her situation has not changed any. Still getting very little sleep and has a lot of pressure on her.  Her husband's health continues to gradually decline. Her chronic pain is severe.  She says she hurts in every bone in her body.  She copes with prayer and confiding in friends.  Says she breaks out in sweats frequently during the day, sometimes to the point of wetting her hair. She does note increased hair loss lately, particularly after showering when she brushes her hair.  ROS as above, plus--> no fevers, no CP, no SOB, no wheezing, no cough, no dizziness, no HAs, no rashes, no melena/hematochezia.  No polyuria or polydipsia.  No focal weakness, paresthesias, or tremors.  No acute vision or hearing abnormalities.  No dysuria or unusual/new urinary urgency or frequency.  No recent changes in lower legs. No n/v/d or abd pain.  No palpitations.    PMP AWARE reviewed today: most recent rx for alprazolam was filled 05/24/2023, # 120, rx by me.  Dr. Manon Hilding prescribes her opioid. No red flags.  Past Medical History:  Diagnosis Date   Allergic rhinitis    Anxiety    Chronic  gastritis    dx'd by EGD 2018 (erosive, likely NSAID-induced). 06/2019 +gastritis.   Chronic pain syndrome    right knee osteo, hx of TKA in R knee.  Pt was told by [name] that no further surgical options are available for her; also, chronic lower back pain.--[name].   Chronic renal insufficiency, stage II (mild) 2015   vs acute fluctuations depending on hydration?---GFR from 60s up to 80s.   Diverticulosis of colon    with hx of 'itis   DJD (degenerative joint disease)    L spine and knees.  R knee inject 03/15/18, L knee inject 12/26/18 (by Dr. Charlett Blake).   FATIGUE / MALAISE 07/01/2009   Chronic   GERD (gastroesophageal reflux disease)    History of colonic polyps    HTN (hypertension)    Hypercholesterolemia    recommended restart of simvastatin 07/2018   Hypothyroid 07/05/2012   IBS (irritable bowel syndrome)    IC (interstitial cystitis)    [name] at [facility]   Iron deficiency anemia, unspecified 06/27/2013   Has required IV iron on multiple occasions (Dr. Myna Hidalgo).  Hemoccults neg 12/2016. Most recent hem f/u labs normal 09/2020.   Lumbar back pain    Spinal stenosis, lumbar region with neurogenic claudication L5-S1 sx's--ESI's no help.  CT L spine stable post-surg appearance as of 12/2019   Major depression, recurrent (HCC)    vs dysthymia,chronic   Memory impairment  10/03/2012   Memory impairment 10/03/2012 >referral to neuro     Monoclonal gammopathy of undetermined significance    IgM kappa MGUS (Dr. Myna Hidalgo) routine f/u has shown stability of this problem (most recent f/u 09/2020)   OSA (obstructive sleep apnea)    Latest sleep study 01/2017.  Some adjustments to CPAP being made+ dental referral for consideration of oral appliance --per pulm MD.   Osteopenia 04/2014; 03/2018   2015: T score -2.0 FRAX 11%/1.8%.  2019 FRAX 10%/1.5%.  10/2021 T score -1.6 radius, -0.9 femur   Palpitations    Psoriasis    Trochanteric bursitis of both hips    + injections  by ortho in the past   Vertigo 2020/21   6+ wks, ->to ENT->?vestib neuronitis with ongoing motion sensitivity, gradually resolving.(Dr. Lazarus Salines 10/18/19). MRI brain normal 09/2020.   Vitamin D deficiency    Xerostomia    and xerophthalmia--per Dr. Lazarus Salines (he suggests SSA, SSB, ESR, rh factor, ANA, and antimicrosmal and antithyroglobulin ab's as of 02/17/16--all these labs were NORMAL.    Past Surgical History:  Procedure Laterality Date   ABDOMINAL HYSTERECTOMY     for prolapse with abdominal incision   BACK SURGERY  01/23/2015   Lumbar decompression with L2-L5 fusion (Dr. Dutch Quint)   CARDIAC CATHETERIZATION     no PCI   CHOLECYSTECTOMY     COLONOSCOPY  04/2006;07/16/19   Diverticulosis, o/w normal.  06/2019->2 adenomas, +Diverticulosis.  Recall 2023-2025.   DEXA  04/06/2018   T score -1.5 femur neck, -2.2 radius.  FRAX 10%/1.5%.  10/2021 T score -1.6 radius, -0.9 femur. rpt 2 yrs   ESOPHAGOGASTRODUODENOSCOPY  05/29/2009; 03/01/17;07/16/19   Esoph normal.  Gastritis (H pylori NEG) + gastric polyps (benign).  Duodenal biopsy NORMAL.  2018-mild chronic gastritis, H pylori NEG. 06/2019 tortuous esophagus w/out stenosis +chronic gastritis H pyl neg.   EYE SURGERY Left    "lazy eye"   HAND SURGERY Right    TRIGGER FINGER; index finger   HERNIA REPAIR     umbilical   KNEE ARTHROSCOPY     RIGHT KNEE X 5   LAMINOTOMY  2010   L-4, L-5 FUSION   NASAL SINUS SURGERY     polypectomy (remote past)   OOPHORECTOMY  2010   Laparoscopic BSO (path benign)   PATELLA FRACTURE SURGERY  06/2007   Dr. Charlann Boxer   POSTERIOR LAMINECTOMY / DECOMPRESSION LUMBAR SPINE  05/2008   Dr. Jordan Likes   TOTAL KNEE ARTHROPLASTY Right 12-07 and 4-07   Dr. Shelle Iron, revision 12-09 Dr. Eileen Stanford at [facility]    Outpatient Medications Prior to Visit  Medication Sig Dispense Refill   ALPRAZolam (XANAX) 1 MG tablet TAKE 1 TO 2 TABLETS BY MOUTH TWICE DAILY AS NEEDED 120 tablet 5   Azelastine HCl (ASTEPRO) 0.15 % SOLN 2 sprays each nostril every  12 hours 30 mL 11   calcium carbonate (OSCAL) 1500 (600 Ca) MG TABS tablet Take by mouth daily.     Cholecalciferol (VITAMIN D3) 5000 units CAPS Take 4 capsules by mouth daily.      diclofenac Sodium (VOLTAREN) 1 % GEL Apply 4 g topically 4 (four) times daily.     diphenhydrAMINE-APAP, sleep, (TYLENOL PM EXTRA STRENGTH PO) Take by mouth as needed.     hydrocortisone 2.5 % cream Apply 1 application  topically as needed. RECTAL ITCHING     levothyroxine (SYNTHROID) 75 MCG tablet Take 1 tablet (75 mcg total) by mouth daily. 90 tablet 1   meclizine (ANTIVERT) 25 MG tablet  TAKE 1 TABLET BY MOUTH THREE TIMES DAILY AS NEEDED FOR DIZZINESS 45 tablet 1   metoprolol tartrate (LOPRESSOR) 25 MG tablet TAKE 1 TABLET BY MOUTH TWICE A DAY 180 tablet 2   Multiple Vitamins-Iron (MULTIVITAMIN/IRON PO) Take 1 tablet by mouth daily.     NIACIN PO Take by mouth daily.     omeprazole (PRILOSEC) 40 MG capsule Take 1 capsule (40 mg total) by mouth daily. 90 capsule 3   Oxycodone HCl 10 MG TABS 10 mg every 4 (four) hours as needed.     polyethylene glycol (MIRALAX / GLYCOLAX) 17 g packet Take 17 g by mouth 2 (two) times daily.     promethazine (PHENERGAN) 25 MG tablet TAKE 1 TABLET BY MOUTH EVERY 6 HOURS AS NEEDED FOR NAUSEA AND VOMITING 90 tablet 2   simvastatin (ZOCOR) 20 MG tablet Take 1 tablet (20 mg total) by mouth at bedtime. 90 tablet 3   cyclobenzaprine (FLEXERIL) 5 MG tablet Take 5 mg by mouth at bedtime. As needed (Patient not taking: Reported on 06/06/2023)     No facility-administered medications prior to visit.    Allergies  Allergen Reactions   Cymbalta [Duloxetine Hcl]     Facial edema   Iohexol      Code: HIVES, Desc: pt states her face and throat swells when administered IV contrast - KB, Onset Date: 16109604    Mepergan [Meperidine-Promethazine]     Cardiac arrest   Morphine Rash   Augmentin [Amoxicillin-Pot Clavulanate] Nausea Only   Citalopram Other (See Comments)    Jittery/heart racing     Hctz [Hydrochlorothiazide] Other (See Comments)    Too much urinating, felt dried out, etc--NO ALLERGY   Lexapro [Escitalopram Oxalate] Other (See Comments)    Jittery,heart racing   Lyrica [Pregabalin] Swelling    Face, hands, fingers, ankles   Promethazine Hcl    Valsartan     REACTION: pt states INTOL to Diovan    Review of Systems As per HPI  PE:    07/04/2023    1:35 PM 06/06/2023    1:35 PM 03/17/2023    1:35 PM  Vitals with BMI  Weight 186 lbs 10 oz 179 lbs 10 oz 188 lbs  Systolic 123 126   Diastolic 80 70   Pulse 60 64      Physical Exam  Gen: Alert, well appearing.  Patient is oriented to person, place, time, and situation. AFFECT: pleasant, lucid thought and speech. No further exam today.  LABS:  Last CBC Lab Results  Component Value Date   WBC 6.6 01/07/2023   HGB 13.1 01/07/2023   HCT 39.6 01/07/2023   MCV 86.3 01/07/2023   MCH 28.5 01/07/2023   RDW 12.4 01/07/2023   PLT 206 01/07/2023   Last metabolic panel Lab Results  Component Value Date   GLUCOSE 103 (H) 01/07/2023   NA 138 01/07/2023   K 4.5 01/07/2023   CL 104 01/07/2023   CO2 25 01/07/2023   BUN 22 01/07/2023   CREATININE 0.86 01/07/2023   GFRNONAA >60 01/07/2023   CALCIUM 9.1 01/07/2023   PROT 6.7 01/07/2023   ALBUMIN 3.9 01/07/2023   LABGLOB 2.9 01/07/2023   AGRATIO 1.2 01/07/2023   BILITOT 0.4 01/07/2023   ALKPHOS 81 01/07/2023   AST 15 01/07/2023   ALT 13 01/07/2023   ANIONGAP 9 01/07/2023   Lab Results  Component Value Date   TSH 1.67 11/10/2022   IMPRESSION AND PLAN:  #1 GAD with superimposed adjustment disorder with  mixed anxious and depressed mood. Coping appropriately but understandably she is struggling a lot.  Empathic listening today. No changes in treatment. She knows she can come back anytime she needs to.  Otherwise we will see her back in a month to recheck. Continue alprazolam 1 mg, 1-2 twice daily as needed. Controlled substance contract is  up-to-date. Urine drug screen today.  #2 heat intolerance, likely simply d/t high temp environ at home (husband requires temp to be set on 77 deg). OK to check TSH today.  #3 hair loss, generalized. Likely telogen effluvium due to acute on chronic emotional stress. Doubtful due to her thyroid disease.  Spent 25 min with pt today reviewing HPI, reviewing relevant past history, doing exam, reviewing and discussing lab and imaging data, and formulating plans."  An After Visit Summary was printed and given to the patient.  FOLLOW UP: No follow-ups on file.  Signed:  Santiago Bumpers, MD           07/04/2023

## 2023-07-08 ENCOUNTER — Inpatient Hospital Stay: Payer: Medicare Other

## 2023-07-08 ENCOUNTER — Ambulatory Visit: Payer: BLUE CROSS/BLUE SHIELD | Admitting: Medical Oncology

## 2023-07-08 LAB — DRUG MONITORING PANEL 376104, URINE
Alphahydroxyalprazolam: 158 ng/mL — ABNORMAL HIGH (ref <25)
Alphahydroxymidazolam: NEGATIVE ng/mL (ref <50)
Alphahydroxytriazolam: NEGATIVE ng/mL (ref <50)
Aminoclonazepam: NEGATIVE ng/mL (ref <25)
Amphetamines: NEGATIVE ng/mL (ref <500)
Barbiturates: NEGATIVE ng/mL (ref <300)
Benzodiazepines: POSITIVE ng/mL — AB (ref <100)
Cocaine Metabolite: NEGATIVE ng/mL (ref <150)
Codeine: NEGATIVE ng/mL (ref <50)
Desmethyltramadol: NEGATIVE ng/mL (ref <100)
Hydrocodone: NEGATIVE ng/mL (ref <50)
Hydromorphone: NEGATIVE ng/mL (ref <50)
Hydroxyethylflurazepam: NEGATIVE ng/mL (ref <50)
Lorazepam: NEGATIVE ng/mL (ref <50)
Morphine: NEGATIVE ng/mL (ref <50)
Nordiazepam: NEGATIVE ng/mL (ref <50)
Norhydrocodone: NEGATIVE ng/mL (ref <50)
Noroxycodone: 4230 ng/mL — ABNORMAL HIGH (ref <50)
Opiates: NEGATIVE ng/mL (ref <100)
Oxazepam: NEGATIVE ng/mL (ref <50)
Oxycodone: 6296 ng/mL — ABNORMAL HIGH (ref <50)
Oxycodone: POSITIVE ng/mL — AB (ref <100)
Oxymorphone: 2180 ng/mL — ABNORMAL HIGH (ref <50)
Temazepam: NEGATIVE ng/mL (ref <50)
Tramadol: NEGATIVE ng/mL (ref <100)

## 2023-07-08 LAB — DM TEMPLATE

## 2023-07-12 DIAGNOSIS — K5900 Constipation, unspecified: Secondary | ICD-10-CM | POA: Diagnosis not present

## 2023-07-12 DIAGNOSIS — G894 Chronic pain syndrome: Secondary | ICD-10-CM | POA: Diagnosis not present

## 2023-07-12 DIAGNOSIS — M961 Postlaminectomy syndrome, not elsewhere classified: Secondary | ICD-10-CM | POA: Diagnosis not present

## 2023-07-12 DIAGNOSIS — M1712 Unilateral primary osteoarthritis, left knee: Secondary | ICD-10-CM | POA: Diagnosis not present

## 2023-07-13 ENCOUNTER — Inpatient Hospital Stay: Payer: Medicare Other

## 2023-07-13 ENCOUNTER — Ambulatory Visit: Payer: Self-pay | Admitting: Medical Oncology

## 2023-07-18 ENCOUNTER — Inpatient Hospital Stay (HOSPITAL_BASED_OUTPATIENT_CLINIC_OR_DEPARTMENT_OTHER): Payer: Medicare Other | Admitting: Medical Oncology

## 2023-07-18 ENCOUNTER — Encounter: Payer: Self-pay | Admitting: Medical Oncology

## 2023-07-18 ENCOUNTER — Inpatient Hospital Stay: Payer: Medicare Other | Attending: Hematology & Oncology

## 2023-07-18 VITALS — BP 116/71 | HR 66 | Temp 97.7°F | Resp 18 | Wt 188.1 lb

## 2023-07-18 DIAGNOSIS — D500 Iron deficiency anemia secondary to blood loss (chronic): Secondary | ICD-10-CM

## 2023-07-18 DIAGNOSIS — D472 Monoclonal gammopathy: Secondary | ICD-10-CM | POA: Diagnosis not present

## 2023-07-18 DIAGNOSIS — E611 Iron deficiency: Secondary | ICD-10-CM | POA: Insufficient documentation

## 2023-07-18 LAB — CMP (CANCER CENTER ONLY)
ALT: 13 U/L (ref 0–44)
AST: 14 U/L — ABNORMAL LOW (ref 15–41)
Albumin: 4.4 g/dL (ref 3.5–5.0)
Alkaline Phosphatase: 96 U/L (ref 38–126)
Anion gap: 10 (ref 5–15)
BUN: 29 mg/dL — ABNORMAL HIGH (ref 8–23)
CO2: 26 mmol/L (ref 22–32)
Calcium: 9.2 mg/dL (ref 8.9–10.3)
Chloride: 101 mmol/L (ref 98–111)
Creatinine: 0.98 mg/dL (ref 0.44–1.00)
GFR, Estimated: 60 mL/min — ABNORMAL LOW (ref >60)
Glucose, Bld: 119 mg/dL — ABNORMAL HIGH (ref 70–99)
Potassium: 4.1 mmol/L (ref 3.5–5.1)
Sodium: 137 mmol/L (ref 135–145)
Total Bilirubin: 0.4 mg/dL (ref 0.3–1.2)
Total Protein: 6.5 g/dL (ref 6.5–8.1)

## 2023-07-18 LAB — CBC WITH DIFFERENTIAL (CANCER CENTER ONLY)
Abs Immature Granulocytes: 0.02 10*3/uL (ref 0.00–0.07)
Basophils Absolute: 0.1 10*3/uL (ref 0.0–0.1)
Basophils Relative: 1 %
Eosinophils Absolute: 0.4 10*3/uL (ref 0.0–0.5)
Eosinophils Relative: 5 %
HCT: 40.2 % (ref 36.0–46.0)
Hemoglobin: 13.3 g/dL (ref 12.0–15.0)
Immature Granulocytes: 0 %
Lymphocytes Relative: 36 %
Lymphs Abs: 3 10*3/uL (ref 0.7–4.0)
MCH: 27.8 pg (ref 26.0–34.0)
MCHC: 33.1 g/dL (ref 30.0–36.0)
MCV: 83.9 fL (ref 80.0–100.0)
Monocytes Absolute: 0.4 10*3/uL (ref 0.1–1.0)
Monocytes Relative: 5 %
Neutro Abs: 4.3 10*3/uL (ref 1.7–7.7)
Neutrophils Relative %: 53 %
Platelet Count: 209 10*3/uL (ref 150–400)
RBC: 4.79 MIL/uL (ref 3.87–5.11)
RDW: 12.4 % (ref 11.5–15.5)
WBC Count: 8.2 10*3/uL (ref 4.0–10.5)
nRBC: 0 % (ref 0.0–0.2)

## 2023-07-18 LAB — LACTATE DEHYDROGENASE: LDH: 149 U/L (ref 98–192)

## 2023-07-18 LAB — FERRITIN: Ferritin: 60 ng/mL (ref 11–307)

## 2023-07-18 NOTE — Progress Notes (Signed)
Hematology and Oncology Follow Up Visit  AMEA WALSHE [id number] [birth year] 77 y.o. 10/05/2018   Principle Diagnosis:  1. IgM kappa monoclonal gammopathy of undetermined significance 2. Intermittent iron deficiency anemia  Current Therapy:   IV iron as indicated - last received in January 2021    Interim History:  Ms. Kondo is here today for follow-up.We see her every 6 months.   She is having a difficult time. Her husband has metastatic cancer and is having complications from his chemotherapy.   When we last saw her back in April 2024, her ferritin was 109 with an iron saturation of 19%.  She also has IgM kappa monoclonal spike.  When we last saw her, her M spike was 0.3 g/dL.  Her IgM level was 489 mg/dL.  Her Kappa light chain was 2.5 mg/dL.  She has had no change in bowel or bladder habits.  She does have some constipation..  She has had no bleeding.  There is been no fever.  She has had no problems with COVID.  Overall, I would say her performance status is ECOG 1. Wt Readings from Last 3 Encounters:  07/18/23 188 lb 1.9 oz (85.3 kg)  07/04/23 186 lb 9.6 oz (84.6 kg)  06/06/23 179 lb 9.6 oz (81.5 kg)   Medications:  Allergies as of 10/05/2018       Reactions   Cymbalta [duloxetine Hcl]    Facial edema   Iohexol     Code: HIVES, Desc: pt states her face and throat swells when administered IV contrast - KB, Onset Date: [id number]   Mepergan [meperidine-promethazine]    Cardiac arrest   Morphine Rash   Augmentin [amoxicillin-pot Clavulanate] Nausea Only   Citalopram Other (See Comments)   Jittery/heart racing   Hctz [hydrochlorothiazide] Other (See Comments)   Too much urinating, felt dried out, etc--NO ALLERGY   Lexapro [escitalopram Oxalate] Other (See Comments)   Jittery,heart racing   Lyrica [pregabalin] Swelling   Face, hands, fingers, ankles   Valsartan    REACTION: pt states INTOL to Diovan        Medication List        Accurate as of October 05, 2018 12:55 PM. Always use your most recent med list.          acetaminophen 325 MG tablet Commonly known as:  TYLENOL Take 650 mg by mouth every 6 (six) hours as needed.   ALIGN 4 MG Caps Take 1 capsule (4 mg total) by mouth daily.   ALPRAZolam 0.5 MG tablet Commonly known as:  XANAX TAKE ONE-HALF TO ONE TABLET BY MOUTH THREE TIMES DAILY   Azelastine HCl 0.15 % Soln Commonly known as:  ASTEPRO 2 sprays each nostril every 12 hours   calcium carbonate 1500 (600 Ca) MG Tabs tablet Commonly known as:  OSCAL Take by mouth daily.   dicyclomine 10 MG capsule Commonly known as:  BENTYL Take 1 capsule (10 mg total) by mouth 3 (three) times daily before meals.   hydrocortisone 2.5 % cream Apply 1 application topically as needed. RECTAL ITCHING   levothyroxine 75 MCG tablet Commonly known as:  SYNTHROID, LEVOTHROID TAKE 1/2 (ONE-HALF) TABLET BY MOUTH ONCE DAILY   magnesium (amino acid chelate) 133 MG tablet Take 1 tablet by mouth daily.   meclizine 25 MG tablet Commonly known as:  ANTIVERT Take 1 tablet (25 mg total) by mouth every 6 (six) hours as needed for dizziness.   MELATONIN PO Take by mouth.   meloxicam 7.5  MG tablet Commonly known as:  MOBIC TAKE 2 TABLETS (15 MG TOTAL) BY MOUTH DAILY.   metoprolol tartrate 25 MG tablet Commonly known as:  LOPRESSOR Take 1 tablet (25 mg total) by mouth daily.   MULTIVITAMIN/IRON PO Take 1 tablet by mouth daily.   NIACIN PO Take by mouth.   omeprazole 40 MG capsule Commonly known as:  PRILOSEC Take 1 capsule (40 mg total) by mouth daily.   Oxycodone HCl 10 MG Tabs 10 mg every 4 (four) hours as needed. 1 every 6 hours for pain   promethazine 25 MG tablet Commonly known as:  PHENERGAN TAKE ONE TABLET BY MOUTH EVERY 6 HOURS AS NEEDED FOR NAUSEA AND VOMITING   RED YEAST RICE PO Take by mouth.   simvastatin 20 MG tablet Commonly known as:  ZOCOR Take 1 tablet (20 mg total) by mouth at bedtime.   SLEEP AID 25  MG tablet Generic drug:  doxylamine (Sleep) Take 25 mg by mouth at bedtime as needed.   traZODone 50 MG tablet Commonly known as:  DESYREL Take 1 tablet by mouth at bedtime.   Vitamin D3 125 MCG (5000 UT) Caps Take 2 capsules by mouth daily.        Allergies:  Allergies  Allergen Reactions   Cymbalta [Duloxetine Hcl]     Facial edema   Iohexol      Code: HIVES, Desc: pt states her face and throat swells when administered IV contrast - KB, Onset Date: [id number]    Mepergan [Meperidine-Promethazine]     Cardiac arrest   Morphine Rash   Augmentin [Amoxicillin-Pot Clavulanate] Nausea Only   Citalopram Other (See Comments)    Jittery/heart racing    Hctz [Hydrochlorothiazide] Other (See Comments)    Too much urinating, felt dried out, etc--NO ALLERGY   Lexapro [Escitalopram Oxalate] Other (See Comments)    Jittery,heart racing   Lyrica [Pregabalin] Swelling    Face, hands, fingers, ankles   Valsartan     REACTION: pt states INTOL to Diovan    Past Medical History, Surgical history, Social history, and Family History were reviewed and updated.  Review of Systems: Review of Systems  Constitutional: Positive for malaise/fatigue.  Eyes: Positive for blurred vision.  Respiratory: Positive for wheezing.   Cardiovascular: Positive for palpitations.  Gastrointestinal: Positive for nausea.  Genitourinary: Positive for urgency.  Musculoskeletal: Positive for back pain, joint pain and myalgias.  Neurological: Positive for tingling.  Endo/Heme/Allergies: Negative.   Psychiatric/Behavioral: Negative.      Physical Exam:  weight is 174 lb (78.9 kg). Her oral temperature is 97.8 F (36.6 C). Her blood pressure is 135/52 (abnormal) and her pulse is 52 (abnormal). Her respiration is 18 and oxygen saturation is 98%.   Wt Readings from Last 3 Encounters:  10/05/18 174 lb (78.9 kg)  09/05/18 178 lb (80.7 kg)  07/24/18 174 lb 8 oz (79.2 kg)    Physical Exam Vitals signs  reviewed.  HENT:     Head: Normocephalic and atraumatic.  Eyes:     Pupils: Pupils are equal, round, and reactive to light.  Neck:     Musculoskeletal: Normal range of motion.  Cardiovascular:     Rate and Rhythm: Normal rate and regular rhythm.     Heart sounds: Normal heart sounds.  Pulmonary:     Effort: Pulmonary effort is normal.     Breath sounds: Normal breath sounds.  Abdominal:     General: Bowel sounds are normal.     Palpations: Abdomen  is soft.  Musculoskeletal: Normal range of motion.        General: No tenderness or deformity.  Lymphadenopathy:     Cervical: No cervical adenopathy.  Skin:    General: Skin is warm and dry.     Findings: No erythema or rash.  Neurological:     Mental Status: She is alert and oriented to person, place, and time.  Psychiatric:        Behavior: Behavior normal.        Thought Content: Thought content normal.        Judgment: Judgment normal.     Lab Results  Component Value Date   WBC 14.1 (H) 10/05/2018   HGB 14.8 10/05/2018   HCT 45.9 10/05/2018   MCV 91.4 10/05/2018   PLT 260 10/05/2018   Lab Results  Component Value Date   FERRITIN 143 04/06/2018   IRON 31 (L) 04/06/2018   TIBC 284 04/06/2018   UIBC 253 04/06/2018   IRONPCTSAT 11 (L) 04/06/2018   Lab Results  Component Value Date   RETICCTPCT 1.2 01/20/2011   RBC 5.02 10/05/2018   RETICCTABS 52.2 01/20/2011   Lab Results  Component Value Date   KPAFRELGTCHN 17.3 04/06/2018   LAMBDASER 16.6 04/06/2018   KAPLAMBRATIO 1.04 04/06/2018   Lab Results  Component Value Date   IGGSERUM 667 (L) 04/06/2018   IGA 287 04/06/2018   IGMSERUM 630 (H) 04/06/2018   Lab Results  Component Value Date   TOTALPROTELP 6.7 04/06/2018   ALBUMINELP 3.3 04/06/2018   A1GS 0.2 04/06/2018   A2GS 0.9 04/06/2018   BETS 1.0 04/06/2018   BETA2SER 0.5 06/19/2015   GAMS 1.2 04/06/2018   MSPIKE 0.5 (H) 04/06/2018   SPEI Comment 10/06/2017     Chemistry      Component Value  Date/Time   NA 142 10/05/2018 1108   NA 138 03/25/2017 1333   NA 138 11/25/2016 1147   K 3.8 10/05/2018 1108   K 4.3 03/25/2017 1333   K 3.8 11/25/2016 1147   CL 103 10/05/2018 1108   CL 104 03/25/2017 1333   CL 103 01/15/2010 0835   CO2 29 10/05/2018 1108   CO2 24 03/25/2017 1333   CO2 25 11/25/2016 1147   BUN 12 10/05/2018 1108   BUN 12 03/25/2017 1333   BUN 15.3 11/25/2016 1147   CREATININE 0.93 10/05/2018 1108   CREATININE 0.76 03/25/2017 1333   CREATININE 0.8 11/25/2016 1147      Component Value Date/Time   CALCIUM 9.4 10/05/2018 1108   CALCIUM 9.5 03/25/2017 1333   CALCIUM 9.2 11/25/2016 1147   ALKPHOS 104 10/05/2018 1108   ALKPHOS 111 03/25/2017 1333   ALKPHOS 99 11/25/2016 1147   AST 11 (L) 10/05/2018 1108   AST 24 11/25/2016 1147   ALT 14 10/05/2018 1108   ALT 30 11/25/2016 1147   BILITOT 0.5 10/05/2018 1108   BILITOT 0.61 11/25/2016 1147     Encounter Diagnoses  Name Primary?   MGUS (monoclonal gammopathy of unknown significance) Yes   Iron deficiency anemia due to chronic blood loss     Impression and Plan: Ms. Yarwood is a very pleasant 77 yo caucasian female with MGUS and history of iron deficiency.   Todays labs are reviewed and appear stable. Monoclonal labs are pending.   RTC 6 months MD, labs (LDH, SPEP, Ferritin, iron, CBC w/, IgG, light chains, CMP)  Rushie Chestnut PA-C

## 2023-07-19 ENCOUNTER — Telehealth: Payer: Self-pay | Admitting: *Deleted

## 2023-07-19 ENCOUNTER — Other Ambulatory Visit: Payer: Self-pay | Admitting: Hematology & Oncology

## 2023-07-19 LAB — KAPPA/LAMBDA LIGHT CHAINS
Kappa free light chain: 24.4 mg/L — ABNORMAL HIGH (ref 3.3–19.4)
Kappa, lambda light chain ratio: 1.17 (ref 0.26–1.65)
Lambda free light chains: 20.8 mg/L (ref 5.7–26.3)

## 2023-07-19 LAB — IGG, IGA, IGM
IgA: 251 mg/dL (ref 64–422)
IgG (Immunoglobin G), Serum: 696 mg/dL (ref 586–1602)
IgM (Immunoglobulin M), Srm: 468 mg/dL — ABNORMAL HIGH (ref 26–217)

## 2023-07-19 LAB — IRON AND IRON BINDING CAPACITY (CC-WL,HP ONLY)
Iron: 42 ug/dL (ref 28–170)
Saturation Ratios: 10 % — ABNORMAL LOW (ref 10.4–31.8)
TIBC: 412 ug/dL (ref 250–450)
UIBC: 370 ug/dL (ref 148–442)

## 2023-07-19 NOTE — Telephone Encounter (Signed)
-----   Message from Josph Macho sent at 07/19/2023 12:51 PM EDT ----- Please call and let her know that the iron level is quite low.  She needs a dose of IV iron.  Please set this up.  Thanks.  Cindee Lame

## 2023-07-27 LAB — IMMUNOFIXATION REFLEX, SERUM
IgA: 241 mg/dL (ref 64–422)
IgG (Immunoglobin G), Serum: 695 mg/dL (ref 586–1602)
IgM (Immunoglobulin M), Srm: 470 mg/dL — ABNORMAL HIGH (ref 26–217)

## 2023-07-27 LAB — PROTEIN ELECTROPHORESIS, SERUM, WITH REFLEX
A/G Ratio: 1.2 (ref 0.7–1.7)
Albumin ELP: 3.7 g/dL (ref 2.9–4.4)
Alpha-1-Globulin: 0.2 g/dL (ref 0.0–0.4)
Alpha-2-Globulin: 0.8 g/dL (ref 0.4–1.0)
Beta Globulin: 1 g/dL (ref 0.7–1.3)
Gamma Globulin: 1 g/dL (ref 0.4–1.8)
Globulin, Total: 3 g/dL (ref 2.2–3.9)
M-Spike, %: 0.4 g/dL — ABNORMAL HIGH
SPEP Interpretation: 0
Total Protein ELP: 6.7 g/dL (ref 6.0–8.5)

## 2023-07-29 ENCOUNTER — Inpatient Hospital Stay: Payer: Medicare Other

## 2023-08-01 DIAGNOSIS — M25562 Pain in left knee: Secondary | ICD-10-CM | POA: Diagnosis not present

## 2023-08-03 ENCOUNTER — Inpatient Hospital Stay: Payer: Medicare Other | Attending: Family

## 2023-08-03 ENCOUNTER — Ambulatory Visit: Payer: Medicare Other | Admitting: Family Medicine

## 2023-08-03 ENCOUNTER — Inpatient Hospital Stay: Payer: Medicare Other

## 2023-08-03 VITALS — BP 134/72 | HR 68 | Temp 97.8°F | Resp 18

## 2023-08-03 DIAGNOSIS — D500 Iron deficiency anemia secondary to blood loss (chronic): Secondary | ICD-10-CM

## 2023-08-03 DIAGNOSIS — D509 Iron deficiency anemia, unspecified: Secondary | ICD-10-CM | POA: Diagnosis not present

## 2023-08-03 MED ORDER — SODIUM CHLORIDE 0.9 % IV SOLN
250.0000 mg | Freq: Once | INTRAVENOUS | Status: AC
  Administered 2023-08-03: 250 mg via INTRAVENOUS
  Filled 2023-08-03: qty 20

## 2023-08-03 MED ORDER — SODIUM CHLORIDE 0.9 % IV SOLN: Freq: Once | INTRAVENOUS | Status: AC

## 2023-08-03 NOTE — Patient Instructions (Signed)
Sodium Ferric Gluconate Complex Injection What is this medication? SODIUM FERRIC GLUCONATE COMPLEX (SOE dee um FER ik GLOO koe nate KOM pleks) treats low levels of iron (iron deficiency anemia) in people with kidney disease. Iron is a mineral that plays an important role in making red blood cells, which carry oxygen from your lungs to the rest of your body. This medicine may be used for other purposes; ask your health care provider or pharmacist if you have questions. COMMON BRAND NAME(S): Ferrlecit, Nulecit What should I tell my care team before I take this medication? They need to know if you have any of the following conditions: Anemia that is not from iron deficiency High levels of iron in the blood An unusual or allergic reaction to iron, other medications, foods, dyes, or preservatives Pregnant or are trying to become pregnant Breast-feeding How should I use this medication? This medication is injected into a vein. It is given by your care team in a hospital or clinic setting. Talk to your care team about the use of this medication in children. While it may be prescribed for children as young as 6 years for selected conditions, precautions do apply. Overdosage: If you think you have taken too much of this medicine contact a poison control center or emergency room at once. NOTE: This medicine is only for you. Do not share this medicine with others. What if I miss a dose? It is important not to miss your dose. Call your care team if you are unable to keep an appointment. What may interact with this medication? Do not take this medication with any of the following: Deferasirox Deferoxamine Dimercaprol This medication may also interact with the following: Other iron products This list may not describe all possible interactions. Give your health care provider a list of all the medicines, herbs, non-prescription drugs, or dietary supplements you use. Also tell them if you smoke, drink  alcohol, or use illegal drugs. Some items may interact with your medicine. What should I watch for while using this medication? Your condition will be monitored carefully while you are receiving this medication. Visit your care team for regular checks on your progress. You may need blood work while you are taking this medication. What side effects may I notice from receiving this medication? Side effects that you should report to your care team as soon as possible: Allergic reactions--skin rash, itching, hives, swelling of the face, lips, tongue, or throat Low blood pressure--dizziness, feeling faint or lightheaded, blurry vision Shortness of breath Side effects that usually do not require medical attention (report to your care team if they continue or are bothersome): Flushing Headache Joint pain Muscle pain Nausea Pain, redness, or irritation at injection site This list may not describe all possible side effects. Call your doctor for medical advice about side effects. You may report side effects to FDA at 1-800-FDA-1088. Where should I keep my medication? This medication is given in a hospital or clinic and will not be stored at home. NOTE: This sheet is a summary. It may not cover all possible information. If you have questions about this medicine, talk to your doctor, pharmacist, or health care provider.  2024 Elsevier/Gold Standard (2021-01-30 00:00:00)

## 2023-08-05 ENCOUNTER — Encounter: Payer: Self-pay | Admitting: Family Medicine

## 2023-08-05 ENCOUNTER — Ambulatory Visit (INDEPENDENT_AMBULATORY_CARE_PROVIDER_SITE_OTHER): Payer: Medicare Other | Admitting: Family Medicine

## 2023-08-05 VITALS — BP 138/84 | HR 67 | Wt 192.0 lb

## 2023-08-05 DIAGNOSIS — F331 Major depressive disorder, recurrent, moderate: Secondary | ICD-10-CM | POA: Diagnosis not present

## 2023-08-05 DIAGNOSIS — F411 Generalized anxiety disorder: Secondary | ICD-10-CM

## 2023-08-05 MED ORDER — METOPROLOL TARTRATE 25 MG PO TABS: 25.0000 mg | ORAL_TABLET | Freq: Two times a day (BID) | ORAL | 2 refills | Status: DC

## 2023-08-05 MED ORDER — MECLIZINE HCL 25 MG PO TABS: ORAL_TABLET | ORAL | 1 refills | Status: DC

## 2023-08-05 NOTE — Progress Notes (Signed)
OFFICE VISIT  08/05/2023  CC:  Chief Complaint  Patient presents with   Anxiety    Updated CSC today.     Patient is a 77 y.o. female who presents for 1 month follow-up anxiety. A/P as of last visit: "#1 GAD with superimposed adjustment disorder with mixed anxious and depressed mood. Coping appropriately but understandably she is struggling a lot.   Empathic listening today. No changes in treatment. She knows she can come back anytime she needs to.  Otherwise we will see her back in a month to recheck. Continue alprazolam 1 mg, 1-2 twice daily as needed. Controlled substance contract is up-to-date. Urine drug screen today.   #2 heat intolerance, likely simply d/t high temp environ at home (husband requires temp to be set on 77 deg). OK to check TSH today.   #3 hair loss, generalized. Likely telogen effluvium due to acute on chronic emotional stress. Doubtful due to her thyroid disease."  INTERIM HX: [name] is hanging in there.  She cries a lot and worries constantly about what she will do when [name] dies. She thinks about his funeral and wonders how she will stand next to his coffin. she is worried she will want to never leave. Chronic pain unchanged. She takes her Xanax regularly, feels like it helps only a little bit. She still takes care of her husband [name] full-time, takes care of all the household duties, frequently takes him to medical appointments.  History of iron deficiency anemia, followed by oncology--> she got an iron infusion 2 days ago.  PMP AWARE reviewed today: most recent rx for alprazolam was filled 07/08/2023, # 120, rx by me. No red flags.  Past Medical History:  Diagnosis Date   Allergic rhinitis    Anxiety    Chronic gastritis    dx'd by EGD 2018 (erosive, likely NSAID-induced). 06/2019 +gastritis.   Chronic pain syndrome    right knee osteo, hx of TKA in R knee.  Pt was told by [name] that no further surgical options are available for her;  also, chronic lower back pain.--[name].   Chronic renal insufficiency, stage II (mild) 2015   vs acute fluctuations depending on hydration?---GFR from 60s up to 80s.   Diverticulosis of colon    with hx of 'itis   DJD (degenerative joint disease)    L spine and knees.  R knee inject 03/15/18, L knee inject 12/26/18 (by Dr. Charlett Blake).   FATIGUE / MALAISE 07/01/2009   Chronic   GERD (gastroesophageal reflux disease)    History of colonic polyps    HTN (hypertension)    Hypercholesterolemia    recommended restart of simvastatin 07/2018   Hypothyroid 07/05/2012   IBS (irritable bowel syndrome)    IC (interstitial cystitis)    [name] at [facility]   Iron deficiency anemia, unspecified 06/27/2013   Has required IV iron on multiple occasions (Dr. Myna Hidalgo).  Hemoccults neg 12/2016. Most recent hem f/u labs normal 09/2020.   Lumbar back pain    Spinal stenosis, lumbar region with neurogenic claudication L5-S1 sx's--ESI's no help.  CT L spine stable post-surg appearance as of 12/2019   Major depression, recurrent (HCC)    vs dysthymia,chronic   Memory impairment 10/03/2012   Memory impairment 10/03/2012 >referral to neuro     Monoclonal gammopathy of undetermined significance    IgM kappa MGUS (Dr. Myna Hidalgo) routine f/u has shown stability of this problem (most recent f/u 09/2020)   OSA (obstructive sleep apnea)  Latest sleep study 01/2017.  Some adjustments to CPAP being made+ dental referral for consideration of oral appliance --per pulm MD.   Osteopenia 04/2014; 03/2018   2015: T score -2.0 FRAX 11%/1.8%.  2019 FRAX 10%/1.5%.  10/2021 T score -1.6 radius, -0.9 femur   Palpitations    Psoriasis    Trochanteric bursitis of both hips    + injections by ortho in the past   Vertigo 2020/21   6+ wks, ->to ENT->?vestib neuronitis with ongoing motion sensitivity, gradually resolving.(Dr. Lazarus Salines 10/18/19). MRI brain normal 09/2020.   Vitamin D deficiency    Xerostomia    and  xerophthalmia--per Dr. Lazarus Salines (he suggests SSA, SSB, ESR, rh factor, ANA, and antimicrosmal and antithyroglobulin ab's as of 02/17/16--all these labs were NORMAL.    Past Surgical History:  Procedure Laterality Date   ABDOMINAL HYSTERECTOMY     for prolapse with abdominal incision   BACK SURGERY  01/23/2015   Lumbar decompression with L2-L5 fusion (Dr. Dutch Quint)   CARDIAC CATHETERIZATION     no PCI   CHOLECYSTECTOMY     COLONOSCOPY  04/2006;07/16/19   Diverticulosis, o/w normal.  06/2019->2 adenomas, +Diverticulosis.  Recall 2023-2025.   DEXA  04/06/2018   T score -1.5 femur neck, -2.2 radius.  FRAX 10%/1.5%.  10/2021 T score -1.6 radius, -0.9 femur. rpt 2 yrs   ESOPHAGOGASTRODUODENOSCOPY  05/29/2009; 03/01/17;07/16/19   Esoph normal.  Gastritis (H pylori NEG) + gastric polyps (benign).  Duodenal biopsy NORMAL.  2018-mild chronic gastritis, H pylori NEG. 06/2019 tortuous esophagus w/out stenosis +chronic gastritis H pyl neg.   EYE SURGERY Left    "lazy eye"   HAND SURGERY Right    TRIGGER FINGER; index finger   HERNIA REPAIR     umbilical   KNEE ARTHROSCOPY     RIGHT KNEE X 5   LAMINOTOMY  2010   L-4, L-5 FUSION   NASAL SINUS SURGERY     polypectomy (remote past)   OOPHORECTOMY  2010   Laparoscopic BSO (path benign)   PATELLA FRACTURE SURGERY  06/2007   Dr. Charlann Boxer   POSTERIOR LAMINECTOMY / DECOMPRESSION LUMBAR SPINE  05/2008   Dr. Jordan Likes   TOTAL KNEE ARTHROPLASTY Right 12-07 and 4-07   Dr. Shelle Iron, revision 12-09 Dr. Eileen [facility] at [facility]    Outpatient Medications Prior to Visit  Medication Sig Dispense Refill   ALPRAZolam (XANAX) 1 MG tablet TAKE 1 TO 2 TABLETS BY MOUTH TWICE DAILY AS NEEDED 120 tablet 5   Azelastine HCl (ASTEPRO) 0.15 % SOLN 2 sprays each nostril every 12 hours 30 mL 11   calcium carbonate (OSCAL) 1500 (600 Ca) MG TABS tablet Take by mouth daily.     Cholecalciferol (VITAMIN D3) 5000 units CAPS Take 4 capsules by mouth daily.      diclofenac Sodium (VOLTAREN) 1 % GEL Apply 4  g topically 4 (four) times daily.     diphenhydrAMINE-APAP, sleep, (TYLENOL PM EXTRA STRENGTH PO) Take by mouth as needed.     hydrocortisone 2.5 % cream Apply 1 application  topically as needed. RECTAL ITCHING     levothyroxine (SYNTHROID) 75 MCG tablet Take 1 tablet (75 mcg total) by mouth daily. 90 tablet 1   meclizine (ANTIVERT) 25 MG tablet TAKE 1 TABLET BY MOUTH THREE TIMES DAILY AS NEEDED FOR DIZZINESS 45 tablet 1   metoprolol tartrate (LOPRESSOR) 25 MG tablet TAKE 1 TABLET BY MOUTH TWICE A DAY 180 tablet 2   Multiple Vitamins-Iron (MULTIVITAMIN/IRON PO) Take 1 tablet by mouth daily.  NIACIN PO Take by mouth daily.     omeprazole (PRILOSEC) 40 MG capsule Take 1 capsule (40 mg total) by mouth daily. 90 capsule 3   Oxycodone HCl 10 MG TABS 10 mg every 4 (four) hours as needed.     polyethylene glycol (MIRALAX / GLYCOLAX) 17 g packet Take 17 g by mouth 2 (two) times daily.     promethazine (PHENERGAN) 25 MG tablet TAKE 1 TABLET BY MOUTH EVERY 6 HOURS AS NEEDED FOR NAUSEA AND VOMITING 90 tablet 2   simvastatin (ZOCOR) 20 MG tablet Take 1 tablet (20 mg total) by mouth at bedtime. 90 tablet 3   cyclobenzaprine (FLEXERIL) 5 MG tablet Take 5 mg by mouth at bedtime. As needed (Patient not taking: Reported on 06/06/2023)     No facility-administered medications prior to visit.    Allergies  Allergen Reactions   Cymbalta [Duloxetine Hcl]     Facial edema   Iohexol      Code: HIVES, Desc: pt states her face and throat swells when administered IV contrast - KB, Onset Date: 16109604    Mepergan [Meperidine-Promethazine]     Cardiac arrest   Morphine Rash   Augmentin [Amoxicillin-Pot Clavulanate] Nausea Only   Citalopram Other (See Comments)    Jittery/heart racing    Hctz [Hydrochlorothiazide] Other (See Comments)    Too much urinating, felt dried out, etc--NO ALLERGY   Lexapro [Escitalopram Oxalate] Other (See Comments)    Jittery,heart racing   Lyrica [Pregabalin] Swelling    Face,  hands, fingers, ankles   Promethazine Hcl    Valsartan     REACTION: pt states INTOL to Diovan    Review of Systems As per HPI  PE:    08/05/2023    1:55 PM 08/03/2023    3:40 PM 08/03/2023   12:00 PM  Vitals with BMI  Weight 192 lbs    Systolic 138 134 540  Diastolic 84 72 65  Pulse 67 68 66     Physical Exam  Gen: Alert, well appearing.  Patient is oriented to person, place, time, and situation. AFFECT: pleasant, lucid thought and speech. No further exam today  LABS:   Lab Results  Component Value Date   WBC 8.2 07/18/2023   HGB 13.3 07/18/2023   HCT 40.2 07/18/2023   MCV 83.9 07/18/2023   PLT 209 07/18/2023   Lab Results  Component Value Date   IRON 42 07/18/2023   TIBC 412 07/18/2023   FERRITIN 60 07/18/2023   Last metabolic panel Lab Results  Component Value Date   GLUCOSE 119 (H) 07/18/2023   NA 137 07/18/2023   K 4.1 07/18/2023   CL 101 07/18/2023   CO2 26 07/18/2023   BUN 29 (H) 07/18/2023   CREATININE 0.98 07/18/2023   GFRNONAA 60 (L) 07/18/2023   CALCIUM 9.2 07/18/2023   PROT 6.5 07/18/2023   ALBUMIN 4.4 07/18/2023   LABGLOB 3.0 07/18/2023   AGRATIO 1.2 07/18/2023   BILITOT 0.4 07/18/2023   ALKPHOS 96 07/18/2023   AST 14 (L) 07/18/2023   ALT 13 07/18/2023   ANIONGAP 10 07/18/2023   Last thyroid functions Lab Results  Component Value Date   TSH 1.67 11/10/2022   T3TOTAL 137 06/26/2020   IMPRESSION AND PLAN:  GAD, recurrent MDD.   Empathic listening today. No changes in treatment. She knows she can come back anytime she needs to.  Otherwise we will see her back in a month to recheck. Continue alprazolam 1 mg, 1-2 twice daily  as needed.  Many different antidepressants over the years have not helped and/or she has been intolerant of them. Controlled substance contract is up-to-date. UDS UTD.  An After Visit Summary was printed and given to the patient.  FOLLOW UP: No follow-ups on file.  Signed:  Santiago Bumpers, MD            08/05/2023

## 2023-08-09 DIAGNOSIS — K5900 Constipation, unspecified: Secondary | ICD-10-CM | POA: Diagnosis not present

## 2023-08-09 DIAGNOSIS — M1712 Unilateral primary osteoarthritis, left knee: Secondary | ICD-10-CM | POA: Diagnosis not present

## 2023-08-09 DIAGNOSIS — M961 Postlaminectomy syndrome, not elsewhere classified: Secondary | ICD-10-CM | POA: Diagnosis not present

## 2023-08-09 DIAGNOSIS — G894 Chronic pain syndrome: Secondary | ICD-10-CM | POA: Diagnosis not present

## 2023-08-16 DIAGNOSIS — N3946 Mixed incontinence: Secondary | ICD-10-CM | POA: Diagnosis not present

## 2023-08-16 DIAGNOSIS — R350 Frequency of micturition: Secondary | ICD-10-CM | POA: Diagnosis not present

## 2023-09-02 ENCOUNTER — Ambulatory Visit: Payer: Medicare Other | Admitting: Family Medicine

## 2023-09-02 NOTE — Progress Notes (Unsigned)
OFFICE VISIT  09/02/2023  CC: No chief complaint on file.   Patient is a 77 y.o. female who presents for 1 month follow-up anxiety and depression. A/P as of last visit: "GAD, recurrent MDD. Empathic listening today. No changes in treatment. She knows she can come back anytime she needs to.  Otherwise we will see her back in a month to recheck. Continue alprazolam 1 mg, 1-2 twice daily as needed.  Many different antidepressants over the years have not helped and/or she has been intolerant of them. Controlled substance contract is up-to-date. UDS UTD."  INTERIM HX: ***   PMP AWARE reviewed today: most recent rx for alprazolam was filled 07/08/23, # 120, rx by me. No red flags.   Past Medical History:  Diagnosis Date   Allergic rhinitis    Anxiety    Chronic gastritis    dx'd by EGD 2018 (erosive, likely NSAID-induced). 06/2019 +gastritis.   Chronic pain syndrome    right knee osteo, hx of TKA in R knee.  Pt was told by [name] that no further surgical options are available for her; also, chronic lower back pain.--[name].   Chronic renal insufficiency, stage II (mild) 2015   vs acute fluctuations depending on hydration?---GFR from 60s up to 80s.   Diverticulosis of colon    with hx of 'itis   DJD (degenerative joint disease)    L spine and knees.  R knee inject 03/15/18, L knee inject 12/26/18 (by Dr. Charlett Blake).   FATIGUE / MALAISE 07/01/2009   Chronic   GERD (gastroesophageal reflux disease)    History of colonic polyps    HTN (hypertension)    Hypercholesterolemia    recommended restart of simvastatin 07/2018   Hypothyroid 07/05/2012   IBS (irritable bowel syndrome)    IC (interstitial cystitis)    [name] at [facility]   Iron deficiency anemia, unspecified 06/27/2013   Has required IV iron on multiple occasions (Dr. Myna Hidalgo).  Hemoccults neg 12/2016. Most recent hem f/u labs normal 09/2020.   Lumbar back pain    Spinal stenosis, lumbar region  with neurogenic claudication L5-S1 sx's--ESI's no help.  CT L spine stable post-surg appearance as of 12/2019   Major depression, recurrent (HCC)    vs dysthymia,chronic   Memory impairment 10/03/2012   Memory impairment 10/03/2012 >referral to neuro     Monoclonal gammopathy of undetermined significance    IgM kappa MGUS (Dr. Myna Hidalgo) routine f/u has shown stability of this problem (most recent f/u 09/2020)   OSA (obstructive sleep apnea)    Latest sleep study 01/2017.  Some adjustments to CPAP being made+ dental referral for consideration of oral appliance --per pulm MD.   Osteopenia 04/2014; 03/2018   2015: T score -2.0 FRAX 11%/1.8%.  2019 FRAX 10%/1.5%.  10/2021 T score -1.6 radius, -0.9 femur   Palpitations    Psoriasis    Trochanteric bursitis of both hips    + injections by ortho in the past   Vertigo 2020/21   6+ wks, ->to ENT->?vestib neuronitis with ongoing motion sensitivity, gradually resolving.(Dr. Lazarus Salines 10/18/19). MRI brain normal 09/2020.   Vitamin D deficiency    Xerostomia    and xerophthalmia--per Dr. Lazarus Salines (he suggests SSA, SSB, ESR, rh factor, ANA, and antimicrosmal and antithyroglobulin ab's as of 02/17/16--all these labs were NORMAL.    Past Surgical History:  Procedure Laterality Date   ABDOMINAL HYSTERECTOMY     for prolapse with abdominal incision   BACK SURGERY  01/23/2015  Lumbar decompression with L2-L5 fusion (Dr. Dutch Quint)   CARDIAC CATHETERIZATION     no PCI   CHOLECYSTECTOMY     COLONOSCOPY  04/2006;07/16/19   Diverticulosis, o/w normal.  06/2019->2 adenomas, +Diverticulosis.  Recall 2023-2025.   DEXA  04/06/2018   T score -1.5 femur neck, -2.2 radius.  FRAX 10%/1.5%.  10/2021 T score -1.6 radius, -0.9 femur. rpt 2 yrs   ESOPHAGOGASTRODUODENOSCOPY  05/29/2009; 03/01/17;07/16/19   Esoph normal.  Gastritis (H pylori NEG) + gastric polyps (benign).  Duodenal biopsy NORMAL.  2018-mild chronic gastritis, H pylori NEG. 06/2019 tortuous esophagus w/out stenosis  +chronic gastritis H pyl neg.   EYE SURGERY Left    "lazy eye"   HAND SURGERY Right    TRIGGER FINGER; index finger   HERNIA REPAIR     umbilical   KNEE ARTHROSCOPY     RIGHT KNEE X 5   LAMINOTOMY  2010   L-4, L-5 FUSION   NASAL SINUS SURGERY     polypectomy (remote past)   OOPHORECTOMY  2010   Laparoscopic BSO (path benign)   PATELLA FRACTURE SURGERY  06/2007   Dr. Charlann Boxer   POSTERIOR LAMINECTOMY / DECOMPRESSION LUMBAR SPINE  05/2008   Dr. Jordan Likes   TOTAL KNEE ARTHROPLASTY Right 12-07 and 4-07   Dr. Shelle Iron, revision 12-09 Dr. Eileen Stanford at [facility]    Outpatient Medications Prior to Visit  Medication Sig Dispense Refill   ALPRAZolam (XANAX) 1 MG tablet TAKE 1 TO 2 TABLETS BY MOUTH TWICE DAILY AS NEEDED 120 tablet 5   Azelastine HCl (ASTEPRO) 0.15 % SOLN 2 sprays each nostril every 12 hours 30 mL 11   calcium carbonate (OSCAL) 1500 (600 Ca) MG TABS tablet Take by mouth daily.     Cholecalciferol (VITAMIN D3) 5000 units CAPS Take 4 capsules by mouth daily.      cyclobenzaprine (FLEXERIL) 5 MG tablet Take 5 mg by mouth at bedtime. As needed (Patient not taking: Reported on 06/06/2023)     diclofenac Sodium (VOLTAREN) 1 % GEL Apply 4 g topically 4 (four) times daily.     diphenhydrAMINE-APAP, sleep, (TYLENOL PM EXTRA STRENGTH PO) Take by mouth as needed.     hydrocortisone 2.5 % cream Apply 1 application  topically as needed. RECTAL ITCHING     levothyroxine (SYNTHROID) 75 MCG tablet Take 1 tablet (75 mcg total) by mouth daily. 90 tablet 1   meclizine (ANTIVERT) 25 MG tablet TAKE 1 TABLET BY MOUTH THREE TIMES DAILY AS NEEDED FOR DIZZINESS 45 tablet 1   metoprolol tartrate (LOPRESSOR) 25 MG tablet Take 1 tablet (25 mg total) by mouth 2 (two) times daily. 180 tablet 2   Multiple Vitamins-Iron (MULTIVITAMIN/IRON PO) Take 1 tablet by mouth daily.     NIACIN PO Take by mouth daily.     omeprazole (PRILOSEC) 40 MG capsule Take 1 capsule (40 mg total) by mouth daily. 90 capsule 3   Oxycodone HCl 10 MG  TABS 10 mg every 4 (four) hours as needed.     polyethylene glycol (MIRALAX / GLYCOLAX) 17 g packet Take 17 g by mouth 2 (two) times daily.     promethazine (PHENERGAN) 25 MG tablet TAKE 1 TABLET BY MOUTH EVERY 6 HOURS AS NEEDED FOR NAUSEA AND VOMITING 90 tablet 2   simvastatin (ZOCOR) 20 MG tablet Take 1 tablet (20 mg total) by mouth at bedtime. 90 tablet 3   No facility-administered medications prior to visit.    Allergies  Allergen Reactions   Cymbalta [Duloxetine Hcl]  Facial edema   Iohexol      Code: HIVES, Desc: pt states her face and throat swells when administered IV contrast - KB, Onset Date: 40981191    Mepergan [Meperidine-Promethazine]     Cardiac arrest   Morphine Rash   Augmentin [Amoxicillin-Pot Clavulanate] Nausea Only   Citalopram Other (See Comments)    Jittery/heart racing    Hctz [Hydrochlorothiazide] Other (See Comments)    Too much urinating, felt dried out, etc--NO ALLERGY   Lexapro [Escitalopram Oxalate] Other (See Comments)    Jittery,heart racing   Lyrica [Pregabalin] Swelling    Face, hands, fingers, ankles   Promethazine Hcl    Valsartan     REACTION: pt states INTOL to Diovan    Review of Systems As per HPI  PE:    08/05/2023    1:55 PM 08/03/2023    3:40 PM 08/03/2023   12:00 PM  Vitals with BMI  Weight 192 lbs    Systolic 138 134 478  Diastolic 84 72 65  Pulse 67 68 66     Physical Exam  ***  LABS:  Last metabolic panel Lab Results  Component Value Date   GLUCOSE 119 (H) 07/18/2023   NA 137 07/18/2023   K 4.1 07/18/2023   CL 101 07/18/2023   CO2 26 07/18/2023   BUN 29 (H) 07/18/2023   CREATININE 0.98 07/18/2023   GFRNONAA 60 (L) 07/18/2023   CALCIUM 9.2 07/18/2023   PROT 6.5 07/18/2023   ALBUMIN 4.4 07/18/2023   LABGLOB 3.0 07/18/2023   AGRATIO 1.2 07/18/2023   BILITOT 0.4 07/18/2023   ALKPHOS 96 07/18/2023   AST 14 (L) 07/18/2023   ALT 13 07/18/2023   ANIONGAP 10 07/18/2023   Last thyroid functions Lab  Results  Component Value Date   TSH 1.67 11/10/2022   T3TOTAL 137 06/26/2020   IMPRESSION AND PLAN:  No problem-specific Assessment & Plan notes found for this encounter.   An After Visit Summary was printed and given to the patient.  FOLLOW UP: No follow-ups on file.  Signed:  Santiago Bumpers, MD           09/02/2023

## 2023-09-06 ENCOUNTER — Encounter: Payer: Self-pay | Admitting: Family Medicine

## 2023-09-15 ENCOUNTER — Telehealth: Payer: Self-pay

## 2023-09-15 NOTE — Telephone Encounter (Signed)
Copied from CRM [id number]. Topic: General - Other >> Sep 09, 2023 12:59 PM Suzette B wrote: Reason for CRM: Patient called to advised the office there that one of their patients has just passed She's asked that someone from the office please call here  I have started sympathy card.

## 2023-09-15 NOTE — Telephone Encounter (Signed)
Spoke with Steward Drone and she wanted to thank every one for their prayers. [name]'s funeral was today

## 2023-09-16 NOTE — Telephone Encounter (Signed)
Spoke to patient. Patient sister, Okey Dupre, passed on 09-12-2024. Rose was a patient of Dr. Samul Dada.

## 2023-09-19 ENCOUNTER — Other Ambulatory Visit: Payer: Self-pay | Admitting: Family Medicine

## 2023-10-06 DIAGNOSIS — M961 Postlaminectomy syndrome, not elsewhere classified: Secondary | ICD-10-CM | POA: Diagnosis not present

## 2023-10-06 DIAGNOSIS — K5900 Constipation, unspecified: Secondary | ICD-10-CM | POA: Diagnosis not present

## 2023-10-06 DIAGNOSIS — G894 Chronic pain syndrome: Secondary | ICD-10-CM | POA: Diagnosis not present

## 2023-10-06 DIAGNOSIS — M1712 Unilateral primary osteoarthritis, left knee: Secondary | ICD-10-CM | POA: Diagnosis not present

## 2023-10-17 DIAGNOSIS — M25562 Pain in left knee: Secondary | ICD-10-CM | POA: Diagnosis not present

## 2023-11-03 DIAGNOSIS — K5900 Constipation, unspecified: Secondary | ICD-10-CM | POA: Diagnosis not present

## 2023-11-03 DIAGNOSIS — M1712 Unilateral primary osteoarthritis, left knee: Secondary | ICD-10-CM | POA: Diagnosis not present

## 2023-11-03 DIAGNOSIS — M961 Postlaminectomy syndrome, not elsewhere classified: Secondary | ICD-10-CM | POA: Diagnosis not present

## 2023-11-03 DIAGNOSIS — G894 Chronic pain syndrome: Secondary | ICD-10-CM | POA: Diagnosis not present

## 2023-11-04 ENCOUNTER — Ambulatory Visit: Payer: Medicare Other | Admitting: Family Medicine

## 2023-11-04 ENCOUNTER — Encounter: Payer: Self-pay | Admitting: Family Medicine

## 2023-11-04 VITALS — BP 122/77 | HR 57 | Ht 66.5 in | Wt 189.2 lb

## 2023-11-04 DIAGNOSIS — F411 Generalized anxiety disorder: Secondary | ICD-10-CM | POA: Diagnosis not present

## 2023-11-04 DIAGNOSIS — E559 Vitamin D deficiency, unspecified: Secondary | ICD-10-CM | POA: Diagnosis not present

## 2023-11-04 DIAGNOSIS — E039 Hypothyroidism, unspecified: Secondary | ICD-10-CM | POA: Diagnosis not present

## 2023-11-04 LAB — VITAMIN D 25 HYDROXY (VIT D DEFICIENCY, FRACTURES): VITD: 31.12 ng/mL (ref 30.00–100.00)

## 2023-11-04 LAB — TSH: TSH: 1.53 u[IU]/mL (ref 0.35–5.50)

## 2023-11-04 MED ORDER — SIMVASTATIN 20 MG PO TABS: 20.0000 mg | ORAL_TABLET | Freq: Every day | ORAL | 3 refills | Status: AC

## 2023-11-04 MED ORDER — OMEPRAZOLE 40 MG PO CPDR: 40.0000 mg | DELAYED_RELEASE_CAPSULE | Freq: Every day | ORAL | 3 refills | Status: AC

## 2023-11-04 NOTE — Progress Notes (Signed)
OFFICE VISIT  11/04/2023  CC:  Chief Complaint  Patient presents with   Medical Management of Chronic Issues    Patient is a 78 y.o. female who presents for 45-month follow-up GAD and hypothyroidism.  INTERIM HX: As usual she is dealing with a lot of life stress. Ruminates on worries, mostly regarding her husband and his terminal cancer.  She wonders how her life will be after he dies.   Past Medical History:  Diagnosis Date   Allergic rhinitis    Anxiety    Chronic gastritis    dx'd by EGD 2018 (erosive, likely NSAID-induced). 06/2019 +gastritis.   Chronic pain syndrome    right knee osteo, hx of TKA in R knee.  Pt was told by [name] that no further surgical options are available for her; also, chronic lower back pain.--[name].   Chronic renal insufficiency, stage II (mild) 2015   vs acute fluctuations depending on hydration?---GFR from 60s up to 80s.   Diverticulosis of colon    with hx of 'itis   DJD (degenerative joint disease)    L spine and knees.  R knee inject 03/15/18, L knee inject 12/26/18 (by Dr. Charlett Blake).   FATIGUE / MALAISE 07/01/2009   Chronic   GERD (gastroesophageal reflux disease)    History of colonic polyps    HTN (hypertension)    Hypercholesterolemia    recommended restart of simvastatin 07/2018   Hypothyroid 07/05/2012   IBS (irritable bowel syndrome)    IC (interstitial cystitis)    [name] at [facility]   Iron deficiency anemia, unspecified 06/27/2013   Has required IV iron on multiple occasions (Dr. Myna Hidalgo).  Hemoccults neg 12/2016. Most recent hem f/u labs normal 09/2020.   Lumbar back pain    Spinal stenosis, lumbar region with neurogenic claudication L5-S1 sx's--ESI's no help.  CT L spine stable post-surg appearance as of 12/2019   Major depression, recurrent (HCC)    vs dysthymia,chronic   Memory impairment 10/03/2012   Memory impairment 10/03/2012 >referral to neuro     Monoclonal gammopathy of undetermined  significance    IgM kappa MGUS (Dr. Myna Hidalgo) routine f/u has shown stability of this problem (most recent f/u 09/2020)   OSA (obstructive sleep apnea)    Latest sleep study 01/2017.  Some adjustments to CPAP being made+ dental referral for consideration of oral appliance --per pulm MD.   Osteopenia 04/2014; 03/2018   2015: T score -2.0 FRAX 11%/1.8%.  2019 FRAX 10%/1.5%.  10/2021 T score -1.6 radius, -0.9 femur   Palpitations    Psoriasis    Trochanteric bursitis of both hips    + injections by ortho in the past   Vertigo 2020/21   6+ wks, ->to ENT->?vestib neuronitis with ongoing motion sensitivity, gradually resolving.(Dr. Lazarus Salines 10/18/19). MRI brain normal 09/2020.   Vitamin D deficiency    Xerostomia    and xerophthalmia--per Dr. Lazarus Salines (he suggests SSA, SSB, ESR, rh factor, ANA, and antimicrosmal and antithyroglobulin ab's as of 02/17/16--all these labs were NORMAL.    Past Surgical History:  Procedure Laterality Date   ABDOMINAL HYSTERECTOMY     for prolapse with abdominal incision   BACK SURGERY  01/23/2015   Lumbar decompression with L2-L5 fusion (Dr. Dutch Quint)   CARDIAC CATHETERIZATION     no PCI   CHOLECYSTECTOMY     COLONOSCOPY  04/2006;07/16/19   Diverticulosis, o/w normal.  06/2019->2 adenomas, +Diverticulosis.  Recall 2023-2025.   DEXA  04/06/2018   T score -1.5 femur  neck, -2.2 radius.  FRAX 10%/1.5%.  10/2021 T score -1.6 radius, -0.9 femur. rpt 2 yrs   ESOPHAGOGASTRODUODENOSCOPY  05/29/2009; 03/01/17;07/16/19   Esoph normal.  Gastritis (H pylori NEG) + gastric polyps (benign).  Duodenal biopsy NORMAL.  2018-mild chronic gastritis, H pylori NEG. 06/2019 tortuous esophagus w/out stenosis +chronic gastritis H pyl neg.   EYE SURGERY Left    "lazy eye"   HAND SURGERY Right    TRIGGER FINGER; index finger   HERNIA REPAIR     umbilical   KNEE ARTHROSCOPY     RIGHT KNEE X 5   LAMINOTOMY  2010   L-4, L-5 FUSION   NASAL SINUS SURGERY     polypectomy (remote past)   OOPHORECTOMY   2010   Laparoscopic BSO (path benign)   PATELLA FRACTURE SURGERY  06/2007   Dr. Charlann Boxer   POSTERIOR LAMINECTOMY / DECOMPRESSION LUMBAR SPINE  05/2008   Dr. Jordan Likes   TOTAL KNEE ARTHROPLASTY Right 12-07 and 4-07   Dr. Shelle Iron, revision 12-09 Dr. Eileen Stanford at [facility]    Outpatient Medications Prior to Visit  Medication Sig Dispense Refill   ALPRAZolam (XANAX) 1 MG tablet TAKE 1 TO 2 TABLETS BY MOUTH TWICE DAILY AS NEEDED 120 tablet 5   Azelastine HCl (ASTEPRO) 0.15 % SOLN 2 sprays each nostril every 12 hours 30 mL 11   calcium carbonate (OSCAL) 1500 (600 Ca) MG TABS tablet Take by mouth daily.     Cholecalciferol (VITAMIN D3) 5000 units CAPS Take 4 capsules by mouth daily.      cyclobenzaprine (FLEXERIL) 5 MG tablet Take 5 mg by mouth at bedtime. As needed     diclofenac Sodium (VOLTAREN) 1 % GEL Apply 4 g topically 4 (four) times daily.     diphenhydrAMINE-APAP, sleep, (TYLENOL PM EXTRA STRENGTH PO) Take by mouth as needed.     hydrocortisone 2.5 % cream Apply 1 application  topically as needed. RECTAL ITCHING     levothyroxine (SYNTHROID) 75 MCG tablet Take 1 tablet (75 mcg total) by mouth daily. 90 tablet 1   meclizine (ANTIVERT) 25 MG tablet TAKE 1 TABLET BY MOUTH THREE TIMES DAILY AS NEEDED FOR DIZZINESS 45 tablet 1   metoprolol tartrate (LOPRESSOR) 25 MG tablet Take 1 tablet (25 mg total) by mouth 2 (two) times daily. 180 tablet 2   Multiple Vitamins-Iron (MULTIVITAMIN/IRON PO) Take 1 tablet by mouth daily.     NIACIN PO Take by mouth daily.     Oxycodone HCl 10 MG TABS 10 mg every 4 (four) hours as needed.     polyethylene glycol (MIRALAX / GLYCOLAX) 17 g packet Take 17 g by mouth 2 (two) times daily.     promethazine (PHENERGAN) 25 MG tablet TAKE 1 TABLET BY MOUTH EVERY 6 HOURS AS NEEDED FOR NAUSEA AND VOMITING 90 tablet 2   traZODone (DESYREL) 50 MG tablet Take 25 mg by mouth at bedtime.     simvastatin (ZOCOR) 20 MG tablet Take 1 tablet (20 mg total) by mouth at bedtime. 90 tablet 3    omeprazole (PRILOSEC) 40 MG capsule Take 1 capsule (40 mg total) by mouth daily. 90 capsule 3   No facility-administered medications prior to visit.    Allergies  Allergen Reactions   Cymbalta [Duloxetine Hcl]     Facial edema   Iohexol      Code: HIVES, Desc: pt states her face and throat swells when administered IV contrast - KB, Onset Date: [id number]    Mepergan [Meperidine-Promethazine]  Cardiac arrest   Morphine Rash   Augmentin [Amoxicillin-Pot Clavulanate] Nausea Only   Citalopram Other (See Comments)    Jittery/heart racing    Hctz [Hydrochlorothiazide] Other (See Comments)    Too much urinating, felt dried out, etc--NO ALLERGY   Lexapro [Escitalopram Oxalate] Other (See Comments)    Jittery,heart racing   Lyrica [Pregabalin] Swelling    Face, hands, fingers, ankles   Promethazine Hcl    Valsartan     REACTION: pt states INTOL to Diovan    Review of Systems As per HPI  PE:    11/04/2023   12:59 PM 08/05/2023    1:55 PM 08/03/2023    3:40 PM  Vitals with BMI  Height 5' 6.5"    Weight 189 lbs 3 oz 192 lbs   BMI 30.08    Systolic 122 138 161  Diastolic 77 84 72  Pulse 57 67 68     Physical Exam  Gen: Alert, well appearing.  Patient is oriented to person, place, time, and situation. Affect: Slightly anxious and slightly depressed. However, she displays a little bit of appropriate lightheartedness throughout the interview as per her usual.  LABS:  Last CBC Lab Results  Component Value Date   WBC 8.2 07/18/2023   HGB 13.3 07/18/2023   HCT 40.2 07/18/2023   MCV 83.9 07/18/2023   MCH 27.8 07/18/2023   RDW 12.4 07/18/2023   PLT 209 07/18/2023   Last metabolic panel Lab Results  Component Value Date   GLUCOSE 119 (H) 07/18/2023   NA 137 07/18/2023   K 4.1 07/18/2023   CL 101 07/18/2023   CO2 26 07/18/2023   BUN 29 (H) 07/18/2023   CREATININE 0.98 07/18/2023   GFRNONAA 60 (L) 07/18/2023   CALCIUM 9.2 07/18/2023   PROT 6.5 07/18/2023    ALBUMIN 4.4 07/18/2023   LABGLOB 3.0 07/18/2023   AGRATIO 1.2 07/18/2023   BILITOT 0.4 07/18/2023   ALKPHOS 96 07/18/2023   AST 14 (L) 07/18/2023   ALT 13 07/18/2023   ANIONGAP 10 07/18/2023   Last lipids Lab Results  Component Value Date   CHOL 289 (H) 10/22/2021   HDL 32.60 (L) 10/22/2021   LDLCALC 123 (H) 04/21/2021   LDLDIRECT 207.0 10/22/2021   TRIG 310.0 (H) 10/22/2021   CHOLHDL 9 10/22/2021   Last thyroid functions Lab Results  Component Value Date   TSH 1.53 11/04/2023   T3TOTAL 137 06/26/2020   IMPRESSION AND PLAN:  #1 GAD with superimposed adjustment disorder with mixed anxious and depressed mood. Coping appropriately but understandably she is struggling a lot.   Empathic listening today. No changes in treatment. She knows she can come back anytime she needs to.  Otherwise we will see her back in a month to recheck. Continue alprazolam 1 mg, 1-2 twice daily as needed. Controlled substance contract is up-to-date.  #2 hypothyroidism. Her last TSH was 1.67 in Feb 2024. Will check TSH today.  #3 vitamin D deficiency. She takes 5000 units vitamin D supplement daily. Check vitamin D level today.  An After Visit Summary was printed and given to the patient.  Spent 31 min with pt today reviewing HPI, reviewing relevant past history, doing exam, reviewing and discussing lab and imaging data, and formulating plans.  FOLLOW UP: Return in about 4 weeks (around 12/02/2023) for routine chronic illness f/u.  Signed:  Santiago Bumpers, MD           11/04/2023

## 2023-11-07 ENCOUNTER — Encounter: Payer: Self-pay | Admitting: Family Medicine

## 2023-11-08 ENCOUNTER — Other Ambulatory Visit: Payer: Self-pay | Admitting: Family Medicine

## 2023-11-14 ENCOUNTER — Other Ambulatory Visit: Payer: Self-pay | Admitting: Family Medicine

## 2023-11-28 ENCOUNTER — Other Ambulatory Visit: Payer: Self-pay

## 2023-11-28 MED ORDER — ALPRAZOLAM 1 MG PO TABS: ORAL_TABLET | ORAL | 5 refills | Status: DC

## 2023-11-28 NOTE — Telephone Encounter (Signed)
 Requesting: alprazolam 1mg  tablet Contract: 08/05/23 UDS: 06/29/23 Last Visit: 11/04/23 Next Visit: advised to f/u 4 weeks Last Refill: 05/24/23 (120,5)  Please Advise. Med pending

## 2023-11-28 NOTE — Telephone Encounter (Signed)
Pt advised refill sent. °

## 2023-12-01 DIAGNOSIS — K5900 Constipation, unspecified: Secondary | ICD-10-CM | POA: Diagnosis not present

## 2023-12-01 DIAGNOSIS — G894 Chronic pain syndrome: Secondary | ICD-10-CM | POA: Diagnosis not present

## 2023-12-01 DIAGNOSIS — M961 Postlaminectomy syndrome, not elsewhere classified: Secondary | ICD-10-CM | POA: Diagnosis not present

## 2023-12-01 DIAGNOSIS — Z79891 Long term (current) use of opiate analgesic: Secondary | ICD-10-CM | POA: Diagnosis not present

## 2023-12-01 DIAGNOSIS — M1712 Unilateral primary osteoarthritis, left knee: Secondary | ICD-10-CM | POA: Diagnosis not present

## 2023-12-05 ENCOUNTER — Ambulatory Visit: Payer: Medicare Other | Admitting: Family Medicine

## 2023-12-07 DIAGNOSIS — M170 Bilateral primary osteoarthritis of knee: Secondary | ICD-10-CM | POA: Diagnosis not present

## 2023-12-07 DIAGNOSIS — M1712 Unilateral primary osteoarthritis, left knee: Secondary | ICD-10-CM | POA: Diagnosis not present

## 2023-12-07 DIAGNOSIS — M1711 Unilateral primary osteoarthritis, right knee: Secondary | ICD-10-CM | POA: Diagnosis not present

## 2023-12-12 ENCOUNTER — Other Ambulatory Visit: Payer: Self-pay

## 2023-12-12 ENCOUNTER — Emergency Department (HOSPITAL_BASED_OUTPATIENT_CLINIC_OR_DEPARTMENT_OTHER)

## 2023-12-12 ENCOUNTER — Emergency Department (HOSPITAL_BASED_OUTPATIENT_CLINIC_OR_DEPARTMENT_OTHER)
Admission: EM | Admit: 2023-12-12 | Discharge: 2023-12-12 | Disposition: A | Discharge status: Home / Self Care | Attending: Emergency Medicine | Admitting: Emergency Medicine

## 2023-12-12 ENCOUNTER — Encounter (HOSPITAL_BASED_OUTPATIENT_CLINIC_OR_DEPARTMENT_OTHER): Payer: Self-pay | Admitting: Emergency Medicine

## 2023-12-12 DIAGNOSIS — G894 Chronic pain syndrome: Secondary | ICD-10-CM | POA: Diagnosis not present

## 2023-12-12 DIAGNOSIS — G8929 Other chronic pain: Secondary | ICD-10-CM | POA: Diagnosis not present

## 2023-12-12 DIAGNOSIS — R52 Pain, unspecified: Secondary | ICD-10-CM

## 2023-12-12 DIAGNOSIS — Z636 Dependent relative needing care at home: Secondary | ICD-10-CM | POA: Diagnosis not present

## 2023-12-12 DIAGNOSIS — R059 Cough, unspecified: Secondary | ICD-10-CM | POA: Diagnosis not present

## 2023-12-12 DIAGNOSIS — M791 Myalgia, unspecified site: Secondary | ICD-10-CM | POA: Diagnosis not present

## 2023-12-12 DIAGNOSIS — D500 Iron deficiency anemia secondary to blood loss (chronic): Secondary | ICD-10-CM | POA: Diagnosis not present

## 2023-12-12 LAB — RESP PANEL BY RT-PCR (RSV, FLU A&B, COVID)  RVPGX2
Influenza A by PCR: NEGATIVE
Influenza B by PCR: NEGATIVE
Resp Syncytial Virus by PCR: NEGATIVE
SARS Coronavirus 2 by RT PCR: NEGATIVE

## 2023-12-12 MED ORDER — HYDROCODONE-ACETAMINOPHEN 5-325 MG PO TABS
1.0000 | ORAL_TABLET | Freq: Once | ORAL | Status: AC
  Administered 2023-12-12: 1 via ORAL
  Filled 2023-12-12: qty 1

## 2023-12-12 NOTE — Discharge Instructions (Signed)
 You were seen in the ER today for concerns of pain and body aches. Your respiratory panel was negative for COVID, influenza, and RSV. Your chest xray is still currently pending at this time but no abnormalities were seen while you were in the ER. If this were to be abnormal requiring treatment, you will receive a call with those results. Otherwise I would suggest reaching out to your pain management clinic for further evaluation of your symptoms if you are not improving.

## 2023-12-12 NOTE — ED Triage Notes (Addendum)
 Pt POV in wheelchair- c/o soreness in back, BL hips, BL knees, BL feet. Denies known injury.   Took oxycodone appx 1300 today, no relief.

## 2023-12-12 NOTE — ED Provider Notes (Signed)
 [place] [facility] Provider Note   CSN: [id number] Arrival date & time: 12/12/23  1434     History Chief Complaint  Patient presents with   Generalized Body Aches    [name] is a 78 y.o. female.  Patient with past history significant for chronic fatigue, chronic pain, iron deficiency anemia, depression, degenerative disc disease presents the emergency department today with concerns of generalized bodyaches.  She currently Dors is pain to her low back, bilateral hips, bilateral knees, bilateral feet, bilateral shoulders.  States she took oxycodone prior to arrival without any improvement in pain.  She does state that she was with her husband in the hospital recently when he was being seen in the emergency department.  She also states that he was eventually admitted and she had been visiting him intermittently over the course of time.  Denies any recent coughing, chills, or fevers.  HPI     Home Medications Prior to Admission medications   Medication Sig Start Date End Date Taking? Authorizing Provider  ALPRAZolam (XANAX) 1 MG tablet TAKE 1 TO 2 TABLETS BY MOUTH TWICE DAILY AS NEEDED 11/28/23   McGowen, Maryjean Morn, MD  Azelastine HCl (ASTEPRO) 0.15 % SOLN 2 sprays each nostril every 12 hours 07/19/14   McGowen, Maryjean Morn, MD  calcium carbonate (OSCAL) 1500 (600 Ca) MG TABS tablet Take by mouth daily.    [provider]  Cholecalciferol (VITAMIN D3) 5000 units CAPS Take 4 capsules by mouth daily.     [provider]  cyclobenzaprine (FLEXERIL) 5 MG tablet Take 5 mg by mouth at bedtime. As needed 12/01/20   [provider]  diclofenac Sodium (VOLTAREN) 1 % GEL Apply 4 g topically 4 (four) times daily. 09/03/20   [provider]  diphenhydrAMINE-APAP, sleep, (TYLENOL PM EXTRA STRENGTH PO) Take by mouth as needed.    [provider]  hydrocortisone 2.5 % cream Apply 1 application  topically as needed.  RECTAL ITCHING 09/08/12   [provider]  levothyroxine (SYNTHROID) 75 MCG tablet TAKE 1 TABLET BY MOUTH EVERY DAY 11/08/23   McGowen, Maryjean Morn, MD  meclizine (ANTIVERT) 25 MG tablet TAKE 1 TABLET BY MOUTH THREE TIMES DAILY AS NEEDED FOR DIZZINESS 08/05/23   McGowen, Maryjean Morn, MD  metoprolol tartrate (LOPRESSOR) 25 MG tablet TAKE 1 TABLET BY MOUTH TWICE A DAY 11/14/23   McGowen, Maryjean Morn, MD  Multiple Vitamins-Iron (MULTIVITAMIN/IRON PO) Take 1 tablet by mouth daily.    [provider]  NIACIN PO Take by mouth daily.    [provider]  omeprazole (PRILOSEC) 40 MG capsule Take 1 capsule (40 mg total) by mouth daily. 11/04/23   McGowen, Maryjean Morn, MD  Oxycodone HCl 10 MG TABS 10 mg every 4 (four) hours as needed. 12/01/15   [provider]  polyethylene glycol (MIRALAX / GLYCOLAX) 17 g packet Take 17 g by mouth 2 (two) times daily.    [provider]  promethazine (PHENERGAN) 25 MG tablet TAKE 1 TABLET BY MOUTH EVERY 6 HOURS AS NEEDED FOR NAUSEA AND VOMITING 02/05/22   McGowen, Maryjean Morn, MD  simvastatin (ZOCOR) 20 MG tablet Take 1 tablet (20 mg total) by mouth at bedtime. 11/04/23   McGowen, Maryjean Morn, MD  traZODone (DESYREL) 50 MG tablet Take 25 mg by mouth at bedtime. 10/06/23   [provider]      Allergies    Cymbalta [duloxetine hcl], Iohexol, Mepergan [meperidine-promethazine], Morphine, Augmentin [amoxicillin-pot clavulanate],  Citalopram, Hctz [hydrochlorothiazide], Lexapro [escitalopram oxalate], Lyrica [pregabalin], Promethazine hcl, and Valsartan    Review of Systems   Review of Systems  Constitutional:  Positive for fatigue.  All other systems reviewed and are negative.   Physical Exam Updated Vital Signs BP (!) 143/82   Pulse 88   Temp 98 F (36.7 C)   Resp 18   Ht 5' 7.5" (1.715 m)   Wt 84.4 kg   SpO2 97%   BMI 28.70 kg/m  Physical Exam Vitals and nursing note reviewed.  Constitutional:      General: She is not in acute  distress.    Appearance: She is well-developed.  HENT:     Head: Normocephalic and atraumatic.  Eyes:     Conjunctiva/sclera: Conjunctivae normal.  Cardiovascular:     Rate and Rhythm: Normal rate and regular rhythm.     Pulses: Normal pulses.     Heart sounds: Normal heart sounds. No murmur heard. Pulmonary:     Effort: Pulmonary effort is normal. No respiratory distress.     Breath sounds: Normal breath sounds. No wheezing or rales.  Abdominal:     Palpations: Abdomen is soft.     Tenderness: There is no abdominal tenderness.  Musculoskeletal:        General: No swelling.     Cervical back: Neck supple.  Skin:    General: Skin is warm and dry.     Capillary Refill: Capillary refill takes less than 2 seconds.  Neurological:     Mental Status: She is alert.  Psychiatric:        Mood and Affect: Mood normal.     ED Results / Procedures / Treatments   Labs (all labs ordered are listed, but only abnormal results are displayed) Labs Reviewed  RESP PANEL BY RT-PCR (RSV, FLU A&B, COVID)  [id number]    EKG None  Radiology No results found.  Procedures Procedures    Medications Ordered in ED Medications  HYDROcodone-acetaminophen (NORCO/VICODIN) 5-325 MG per tablet 1 tablet (1 tablet Oral Given 12/12/23 1845)    ED Course/ Medical Decision Making/ A&P                                 Medical Decision Making Amount and/or Complexity of Data Reviewed Radiology: ordered.  Risk Prescription drug management.    This patient presents to the ED for concern of bodyaches.  Differential diagnosis includes COVID-19, influenza, viral URI, pneumonia   Lab Tests:  I Ordered, and personally interpreted labs.  The pertinent results include: Respiratory panel is negative   Imaging Studies ordered:  I ordered imaging studies including chest x-ray I independently visualized and interpreted imaging which showed no acute findings per my personal interpretation I agree with  the radiologist interpretation   Medicines ordered and prescription drug management:  I ordered medication including Norco for pain Reevaluation of the patient after these medicines showed that the patient improved I have reviewed the patients home medicines and have made adjustments as needed   Problem List / ED Course:  Patient with past history significant for chronic fatigue, chronic pain, iron deficiency anemia, depression, degenerative disc disease of lumbar spine presents ED today with concerns of generalized bodyaches.  Reports this is been ongoing since earlier today.  Took oxycodone at 1 PM without relief in symptoms.  States that she was in the emergency department recently with her husband who eventually was admitted  for complications of his cancer.  States that she visited him intermittently since he has been hospitalized.  She denies any significant coughing, shortness of breath, or chest pain.  Feels that pain is present at all times.  Feels increased fatigue. Physical exam is reassuring with no significant limitations in range of motion of the upper or lower extremities.  Palpable pulses and neurologically intact.  No abnormal lung sounds from auscultation.  Will obtain respiratory panel and chest x-ray to rule out possible viral source of current generalized bodyaches.  Patient agreeable with this current plan. Respiratory panel negative.  Chest x-ray still pending read by radiology but my personal rotation shows no acute findings.  Patient given a one-time dose of Norco.  Advise that chest x-ray results will be pending and if any abnormal findings are seen requiring intervention, she will receive a phone call.  Encourage close follow-up with pain management for further assessment if her symptoms or not improving.  Patient otherwise stable at this time and discharged home for outpatient follow-up.   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

## 2023-12-14 ENCOUNTER — Emergency Department (HOSPITAL_COMMUNITY)

## 2023-12-14 ENCOUNTER — Other Ambulatory Visit: Payer: Self-pay

## 2023-12-14 ENCOUNTER — Emergency Department (HOSPITAL_COMMUNITY)
Admission: EM | Admit: 2023-12-14 | Discharge: 2023-12-14 | Disposition: A | Discharge status: Home / Self Care | Attending: Emergency Medicine | Admitting: Emergency Medicine

## 2023-12-14 DIAGNOSIS — M545 Low back pain, unspecified: Secondary | ICD-10-CM | POA: Insufficient documentation

## 2023-12-14 DIAGNOSIS — G8929 Other chronic pain: Secondary | ICD-10-CM | POA: Diagnosis not present

## 2023-12-14 DIAGNOSIS — M25551 Pain in right hip: Secondary | ICD-10-CM | POA: Insufficient documentation

## 2023-12-14 DIAGNOSIS — M5136 Other intervertebral disc degeneration, lumbar region with discogenic back pain only: Secondary | ICD-10-CM | POA: Diagnosis not present

## 2023-12-14 DIAGNOSIS — Z981 Arthrodesis status: Secondary | ICD-10-CM | POA: Diagnosis not present

## 2023-12-14 DIAGNOSIS — Z79891 Long term (current) use of opiate analgesic: Secondary | ICD-10-CM | POA: Diagnosis not present

## 2023-12-14 DIAGNOSIS — M1611 Unilateral primary osteoarthritis, right hip: Secondary | ICD-10-CM | POA: Diagnosis not present

## 2023-12-14 DIAGNOSIS — M1990 Unspecified osteoarthritis, unspecified site: Secondary | ICD-10-CM

## 2023-12-14 DIAGNOSIS — I700 Atherosclerosis of aorta: Secondary | ICD-10-CM | POA: Diagnosis not present

## 2023-12-14 DIAGNOSIS — M549 Dorsalgia, unspecified: Secondary | ICD-10-CM | POA: Diagnosis not present

## 2023-12-14 DIAGNOSIS — M160 Bilateral primary osteoarthritis of hip: Secondary | ICD-10-CM | POA: Insufficient documentation

## 2023-12-14 DIAGNOSIS — M47816 Spondylosis without myelopathy or radiculopathy, lumbar region: Secondary | ICD-10-CM | POA: Insufficient documentation

## 2023-12-14 DIAGNOSIS — M2550 Pain in unspecified joint: Secondary | ICD-10-CM

## 2023-12-14 LAB — CBC WITH DIFFERENTIAL/PLATELET
Abs Immature Granulocytes: 0.02 10*3/uL (ref 0.00–0.07)
Basophils Absolute: 0 10*3/uL (ref 0.0–0.1)
Basophils Relative: 1 %
Eosinophils Absolute: 0.1 10*3/uL (ref 0.0–0.5)
Eosinophils Relative: 1 %
HCT: 43.6 % (ref 36.0–46.0)
Hemoglobin: 14.1 g/dL (ref 12.0–15.0)
Immature Granulocytes: 0 %
Lymphocytes Relative: 21 %
Lymphs Abs: 1.5 10*3/uL (ref 0.7–4.0)
MCH: 27.6 pg (ref 26.0–34.0)
MCHC: 32.3 g/dL (ref 30.0–36.0)
MCV: 85.5 fL (ref 80.0–100.0)
Monocytes Absolute: 0.3 10*3/uL (ref 0.1–1.0)
Monocytes Relative: 4 %
Neutro Abs: 5.5 10*3/uL (ref 1.7–7.7)
Neutrophils Relative %: 73 %
Platelets: 233 10*3/uL (ref 150–400)
RBC: 5.1 MIL/uL (ref 3.87–5.11)
RDW: 12.9 % (ref 11.5–15.5)
WBC: 7.4 10*3/uL (ref 4.0–10.5)
nRBC: 0 % (ref 0.0–0.2)

## 2023-12-14 LAB — URINALYSIS, ROUTINE W REFLEX MICROSCOPIC
Bilirubin Urine: NEGATIVE
Glucose, UA: NEGATIVE mg/dL
Ketones, ur: NEGATIVE mg/dL
Nitrite: NEGATIVE
Protein, ur: NEGATIVE mg/dL
Specific Gravity, Urine: 1.011 (ref 1.005–1.030)
pH: 6 (ref 5.0–8.0)

## 2023-12-14 LAB — BASIC METABOLIC PANEL
Anion gap: 10 (ref 5–15)
BUN: 30 mg/dL — ABNORMAL HIGH (ref 8–23)
CO2: 27 mmol/L (ref 22–32)
Calcium: 9.6 mg/dL (ref 8.9–10.3)
Chloride: 103 mmol/L (ref 98–111)
Creatinine, Ser: 0.93 mg/dL (ref 0.44–1.00)
GFR, Estimated: >60 mL/min (ref >60)
Glucose, Bld: 119 mg/dL — ABNORMAL HIGH (ref 70–99)
Potassium: 4.3 mmol/L (ref 3.5–5.1)
Sodium: 140 mmol/L (ref 135–145)

## 2023-12-14 MED ORDER — METHYLPREDNISOLONE 4 MG PO TBPK: ORAL_TABLET | ORAL | 0 refills | Status: DC

## 2023-12-14 MED ORDER — FOSFOMYCIN TROMETHAMINE 3 G PO PACK
3.0000 g | PACK | Freq: Once | ORAL | Status: AC
  Administered 2023-12-14: 3 g via ORAL
  Filled 2023-12-14: qty 3

## 2023-12-14 MED ORDER — FENTANYL CITRATE PF 50 MCG/ML IJ SOSY
50.0000 ug | PREFILLED_SYRINGE | Freq: Once | INTRAMUSCULAR | Status: AC
  Administered 2023-12-14: 50 ug via INTRAVENOUS
  Filled 2023-12-14: qty 1

## 2023-12-14 NOTE — ED Provider Notes (Signed)
 [place] EMERGENCY DEPARTMENT AT [facility] Provider Note   CSN: [id number] Arrival date & time: 12/14/23  1201     History  Chief Complaint  Patient presents with   Joint Pain    [name] is a 78 y.o. female.  Patient here mostly for right hip right lower back pain for the last couple days.  History of chronic pain takes chronic narcotics.  Recent right knee joint injection.  She denies any fever or chills.  Nothing makes it worse or better.  She has assistive devices at home.  She denies any pain at urination flank pain abdominal pain nausea vomiting diarrhea.  No chest pain or shortness of breath.  No loss of bowel or bladder.  No specific trauma.  The history is provided by the patient.       Home Medications Prior to Admission medications   Medication Sig Start Date End Date Taking? Authorizing Provider  methylPREDNISolone (MEDROL DOSEPAK) 4 MG TBPK tablet Follow package insert 12/14/23  Yes Dirck Butch, DO  ALPRAZolam (XANAX) 1 MG tablet TAKE 1 TO 2 TABLETS BY MOUTH TWICE DAILY AS NEEDED 11/28/23   McGowen, Maryjean Morn, MD  Azelastine HCl (ASTEPRO) 0.15 % SOLN 2 sprays each nostril every 12 hours 07/19/14   McGowen, Maryjean Morn, MD  calcium carbonate (OSCAL) 1500 (600 Ca) MG TABS tablet Take by mouth daily.    [provider]  Cholecalciferol (VITAMIN D3) 5000 units CAPS Take 4 capsules by mouth daily.     [provider]  cyclobenzaprine (FLEXERIL) 5 MG tablet Take 5 mg by mouth at bedtime. As needed 12/01/20   [provider]  diclofenac Sodium (VOLTAREN) 1 % GEL Apply 4 g topically 4 (four) times daily. 09/03/20   [provider]  diphenhydrAMINE-APAP, sleep, (TYLENOL PM EXTRA STRENGTH PO) Take by mouth as needed.    [provider]  hydrocortisone 2.5 % cream Apply 1 application  topically as needed. RECTAL ITCHING 09/08/12   [provider]  levothyroxine (SYNTHROID) 75 MCG tablet TAKE 1 TABLET BY  MOUTH EVERY DAY 11/08/23   McGowen, Maryjean Morn, MD  meclizine (ANTIVERT) 25 MG tablet TAKE 1 TABLET BY MOUTH THREE TIMES DAILY AS NEEDED FOR DIZZINESS 08/05/23   McGowen, Maryjean Morn, MD  metoprolol tartrate (LOPRESSOR) 25 MG tablet TAKE 1 TABLET BY MOUTH TWICE A DAY 11/14/23   McGowen, Maryjean Morn, MD  Multiple Vitamins-Iron (MULTIVITAMIN/IRON PO) Take 1 tablet by mouth daily.    [provider]  NIACIN PO Take by mouth daily.    [provider]  omeprazole (PRILOSEC) 40 MG capsule Take 1 capsule (40 mg total) by mouth daily. 11/04/23   McGowen, Maryjean Morn, MD  Oxycodone HCl 10 MG TABS 10 mg every 4 (four) hours as needed. 12/01/15   [provider]  polyethylene glycol (MIRALAX / GLYCOLAX) 17 g packet Take 17 g by mouth 2 (two) times daily.    [provider]  promethazine (PHENERGAN) 25 MG tablet TAKE 1 TABLET BY MOUTH EVERY 6 HOURS AS NEEDED FOR NAUSEA AND VOMITING 02/05/22   McGowen, Maryjean Morn, MD  simvastatin (ZOCOR) 20 MG tablet Take 1 tablet (20 mg total) by mouth at bedtime. 11/04/23   McGowen, Maryjean Morn, MD  traZODone (DESYREL) 50 MG tablet Take 25 mg by mouth at bedtime. 10/06/23   [provider]      Allergies    Cymbalta [duloxetine hcl], Iohexol, Mepergan [meperidine-promethazine], Morphine, Augmentin [amoxicillin-pot clavulanate], Citalopram, Hctz [  hydrochlorothiazide], Lexapro [escitalopram oxalate], Lyrica [pregabalin], Promethazine hcl, and Valsartan    Review of Systems   Review of Systems  Physical Exam Updated Vital Signs BP 128/71 (BP Location: Right Arm)   Pulse 64   Temp 97.7 F (36.5 C) (Oral)   Resp 16   SpO2 97%  Physical Exam Vitals and nursing note reviewed.  Constitutional:      General: She is not in acute distress.    Appearance: She is well-developed. She is not ill-appearing.  HENT:     Head: Normocephalic and atraumatic.     Nose: Nose normal.     Mouth/Throat:     Mouth: Mucous membranes are moist.  Eyes:      Extraocular Movements: Extraocular movements intact.     Conjunctiva/sclera: Conjunctivae normal.     Pupils: Pupils are equal, round, and reactive to light.  Cardiovascular:     Rate and Rhythm: Normal rate and regular rhythm.     Pulses: Normal pulses.     Heart sounds: Normal heart sounds. No murmur heard. Pulmonary:     Effort: Pulmonary effort is normal. No respiratory distress.     Breath sounds: Normal breath sounds.  Abdominal:     General: Abdomen is flat.     Palpations: Abdomen is soft.     Tenderness: There is no abdominal tenderness.  Musculoskeletal:        General: Tenderness present. No swelling. Normal range of motion.     Cervical back: Normal range of motion and neck supple.     Comments: Tenderness to the right hip, tenderness to the paraspinal lumbar muscles on the right  Skin:    General: Skin is warm and dry.     Capillary Refill: Capillary refill takes less than 2 seconds.  Neurological:     General: No focal deficit present.     Mental Status: She is alert and oriented to person, place, and time.     Cranial Nerves: No cranial nerve deficit.     Sensory: No sensory deficit.     Motor: No weakness.     Coordination: Coordination normal.     Comments: 5+ out of 5 strength throughout, normal sensation, no drift, normal finger-nose-finger, normal speech  Psychiatric:        Mood and Affect: Mood normal.     ED Results / Procedures / Treatments   Labs (all labs ordered are listed, but only abnormal results are displayed) Labs Reviewed  BASIC METABOLIC PANEL - Abnormal; Notable for the following components:      Result Value   Glucose, Bld 119 (*)    BUN 30 (*)    All other components within normal limits  URINALYSIS, ROUTINE W REFLEX MICROSCOPIC - Abnormal; Notable for the following components:   APPearance HAZY (*)    Hgb urine dipstick SMALL (*)    Leukocytes,Ua MODERATE (*)    Bacteria, UA MANY (*)    All other components within normal limits   CBC WITH DIFFERENTIAL/PLATELET    EKG None  Radiology DG Hip Unilat With Pelvis 2-3 Views Right Result Date: 12/14/2023 CLINICAL DATA:  Lower right back pain.  Right hip pain. EXAM: DG HIP (WITH OR WITHOUT PELVIS) 2-3V RIGHT COMPARISON:  CT abdomen pelvis 03/10/2019 FINDINGS: Pelvic bony ring is intact. Postsurgical changes in the lumbar spine. Sclerosis at the pubic symphysis. No gross abnormality to the left hip. Mild degenerative changes in both SI joints. Right hip is located without a fracture. Degenerative changes  involving the superolateral aspect of both hips. Prominent degenerative changes in the lower lumbar spine. IMPRESSION: 1. No acute bone abnormality to the right hip. 2. Degenerative changes in both hips. 3. Degenerative changes in the lower lumbar spine. Electronically Signed   By: Richarda Overlie M.D.   On: 12/14/2023 17:21   DG Lumbar Spine Complete Result Date: 12/14/2023 CLINICAL DATA:  Fall with right back and hip pain. EXAM: LUMBAR SPINE - COMPLETE 4+ VIEW COMPARISON:  MRI lumbar spine 04/03/2023. FINDINGS: L2, L3, L4 posterior fusion hardware with bilateral transpedicular screws again noted. The screws appear intact. Disc spacers are seen at L2-L3, L3-L4 and L4-L5. Spinal alignment is normal. No acute fractures are seen. There are moderate severe degenerative endplate changes at L1-L2. There severe degenerative changes of facet joints at L5-S1. There are atherosclerotic calcifications of the aorta. IMPRESSION: 1. No acute fracture or subluxation of the lumbar spine. 2. L2-L4, L3-L4 posterior fusion hardware. 3. Moderate to severe degenerative endplate changes at L1-L2. Electronically Signed   By: Darliss Cheney M.D.   On: 12/14/2023 17:04    Procedures Procedures    Medications Ordered in ED Medications  fosfomycin (MONUROL) packet 3 g (has no administration in time range)  fentaNYL (SUBLIMAZE) injection 50 mcg (50 mcg Intravenous Given 12/14/23 1543)    ED Course/ Medical  Decision Making/ A&P                                 Medical Decision Making Amount and/or Complexity of Data Reviewed Labs: ordered. Radiology: ordered.  Risk Prescription drug management.   Lucendia Herrlich is here mostly with right hip and back pain.  She does endorse pain all over but these are the areas that she focuses on the most.  Denies any falls.  She takes chronic narcotics.  She denies any pain with urination.  No chest pain shortness of breath.  Differential diagnosis likely musculoskeletal/sciatic type issue will check basic labs and images of the right hip low back.  Does not sound like there is an infectious process or kidney stones or UTI or cardiac or pulmonary or neurologic process going on otherwise.  I do think that this is MSK type pain.  Will give her a dose IV fentanyl and anticipate using Medrol Dosepak at home.  Awaiting lab work and imaging and then we will reevaluate.  She is neurovascular neuromuscular intact on exam.  Have no concern for cauda equina she has no red flag signs of back pain.  Lab work unremarkable per my review interpretation of labs.  May be urinary tract infection and will treat with a dose of fosfomycin.  Is not really having any urinary symptoms.  No white count no fever.  X-rays were unremarkable.  She is feeling much better after pain medications here.  Will prescribe Medrol Dosepak.  Discharged in good condition.  Recommend follow-up with primary care doctor.  This chart was dictated using voice recognition software.  Despite best efforts to proofread,  errors can occur which can change the documentation meaning.         Final Clinical Impression(s) / ED Diagnoses Final diagnoses:  Arthritis  Arthralgia, unspecified joint    Rx / DC Orders ED Discharge Orders          Ordered    methylPREDNISolone (MEDROL DOSEPAK) 4 MG TBPK tablet        12/14/23 1725  Virgina Norfolk, DO 12/14/23 1727

## 2023-12-14 NOTE — Discharge Instructions (Signed)
 Take steroids as prescribed.  Follow-up with her primary care doctor.  Continue chronic narcotic pain medicine at home.  Follow-up with your primary care doctor.

## 2023-12-14 NOTE — ED Triage Notes (Signed)
 Pt reports pain all over esp in legs and left shoulder. Seen [facility] a few days ago, no relief since then.

## 2023-12-16 LAB — URINE CULTURE: Culture: >=100000 — AB

## 2023-12-17 ENCOUNTER — Telehealth (HOSPITAL_BASED_OUTPATIENT_CLINIC_OR_DEPARTMENT_OTHER): Payer: Self-pay | Admitting: *Deleted

## 2023-12-17 NOTE — Telephone Encounter (Signed)
 Post ED Visit - Positive Culture Follow-up  Culture report reviewed by antimicrobial stewardship pharmacist: [facility] []  Enzo Bi, Pharm.D. []  Celedonio Miyamoto, Pharm.D., BCPS AQ-ID []  Garvin Fila, Pharm.D., BCPS []  Georgina Pillion, [address].D., BCPS []  [place], [place].D., BCPS, AAHIVP []  Estella Husk, Pharm.D., BCPS, AAHIVP []  Lysle Pearl, PharmD, BCPS []  Phillips Climes, PharmD, BCPS []  Agapito Games, PharmD, BCPS []  Verlan Friends, PharmD []  Mervyn Gay, PharmD, BCPS []  Vinnie Level, PharmD  [facility] []  Len Childs, PharmD []  Greer Pickerel, PharmD []  Adalberto Cole, PharmD []  Perlie Gold, Rph []  Lonell Face) Jean Rosenthal, PharmD []  Earl Many, PharmD []  Junita Push, PharmD []  Dorna Leitz, PharmD []  Terrilee Files, PharmD []  Lynann Beaver, PharmD []  Keturah Barre, PharmD []  Loralee Pacas, PharmD [x]  Cherylin Mylar, PharmD   Positive urine culture No further patient follow-up is required at this time as likely asymptomatic bacteruria per Arthor Captain, PA-C  Patsey Berthold 12/17/2023, 11:46 AM

## 2023-12-28 DIAGNOSIS — G894 Chronic pain syndrome: Secondary | ICD-10-CM | POA: Diagnosis not present

## 2023-12-28 DIAGNOSIS — M961 Postlaminectomy syndrome, not elsewhere classified: Secondary | ICD-10-CM | POA: Diagnosis not present

## 2023-12-28 DIAGNOSIS — K5900 Constipation, unspecified: Secondary | ICD-10-CM | POA: Diagnosis not present

## 2023-12-28 DIAGNOSIS — M1712 Unilateral primary osteoarthritis, left knee: Secondary | ICD-10-CM | POA: Diagnosis not present

## 2023-12-29 DIAGNOSIS — Z79891 Long term (current) use of opiate analgesic: Secondary | ICD-10-CM | POA: Diagnosis not present

## 2023-12-29 DIAGNOSIS — G894 Chronic pain syndrome: Secondary | ICD-10-CM | POA: Diagnosis not present

## 2024-01-04 ENCOUNTER — Encounter: Payer: Self-pay | Admitting: Family Medicine

## 2024-01-04 ENCOUNTER — Ambulatory Visit (INDEPENDENT_AMBULATORY_CARE_PROVIDER_SITE_OTHER): Admitting: Family Medicine

## 2024-01-04 VITALS — BP 127/75 | HR 58 | Temp 97.7°F | Ht 67.5 in | Wt 190.2 lb

## 2024-01-04 DIAGNOSIS — F331 Major depressive disorder, recurrent, moderate: Secondary | ICD-10-CM | POA: Diagnosis not present

## 2024-01-04 DIAGNOSIS — F411 Generalized anxiety disorder: Secondary | ICD-10-CM | POA: Diagnosis not present

## 2024-01-04 NOTE — Progress Notes (Signed)
 OFFICE VISIT  01/04/2024  CC:  Chief Complaint  Patient presents with   Anxiety    Discuss concerns    Patient is a 78 y.o. female who presents for 11-month follow-up anxiety and pain. A/P as of last visit: "1 GAD with superimposed adjustment disorder with mixed anxious and depressed mood. Coping appropriately but understandably she is struggling a lot. Empathic listening today. No changes in treatment. She knows she can come back anytime she needs to.  Otherwise we will see her back in a month to recheck. Continue alprazolam 1 mg, 1-2 twice daily as needed. Controlled substance contract is up-to-date.   #2 hypothyroidism. Her last TSH was 1.67 in Feb 2024. Will check TSH today.   #3 vitamin D deficiency. She takes 5000 units vitamin D supplement daily. Check vitamin D level today."  INTERIM HX:  She went to the emergency department on March 24 and March 26 for generalized musculoskeletal pain.  Primarily her knees.  lumbar and right hip radiographs without acute abnormality.  She was given a Medrol Dosepak on 12/14/2023.  Continues to struggle with anxiety and depression mostly related to the stress of her husband fighting cancer and chronic pain.  PMP AWARE reviewed today: most recent rx for alprazolam was filled 12/27/23, # 120, rx by me. No red flags.  Past Medical History:  Diagnosis Date   Allergic rhinitis    Anxiety    Chronic gastritis    dx'd by EGD 2018 (erosive, likely NSAID-induced). 06/2019 +gastritis.   Chronic pain syndrome    right knee osteo, hx of TKA in R knee.  Pt was told by [name] that no further surgical options are available for her; also, chronic lower back pain.--[name].   Chronic renal insufficiency, stage II (mild) 2015   vs acute fluctuations depending on hydration?---GFR from 60s up to 80s.   Diverticulosis of colon    with hx of 'itis   DJD (degenerative joint disease)    L spine and knees.  R knee inject 03/15/18, L  knee inject 12/26/18 (by Dr. [place]).   FATIGUE / MALAISE 07/01/2009   Chronic   GERD (gastroesophageal reflux disease)    History of colonic polyps    HTN (hypertension)    Hypercholesterolemia    recommended restart of simvastatin 07/2018   Hypothyroid 07/05/2012   IBS (irritable bowel syndrome)    IC (interstitial cystitis)    [name] at [facility]   Iron deficiency anemia, unspecified 06/27/2013   Has required IV iron on multiple occasions (Dr. Maria Shiner).  Hemoccults neg 12/2016. Most recent hem f/u labs normal 09/2020.   Lumbar back pain    Spinal stenosis, lumbar region with neurogenic claudication L5-S1 sx's--ESI's no help.  CT L spine stable post-surg appearance as of 12/2019   Major depression, recurrent (HCC)    vs dysthymia,chronic   Memory impairment 10/03/2012   Memory impairment 10/03/2012 >referral to neuro     Monoclonal gammopathy of undetermined significance    IgM kappa MGUS (Dr. Maria Shiner) routine f/u has shown stability of this problem (most recent f/u 09/2020)   OSA (obstructive sleep apnea)    Latest sleep study 01/2017.  Some adjustments to CPAP being made+ dental referral for consideration of oral appliance --per pulm MD.   Osteopenia 04/2014; 03/2018   2015: T score -2.0 FRAX 11%/1.8%.  2019 FRAX 10%/1.5%.  10/2021 T score -1.6 radius, -0.9 femur   Palpitations    Psoriasis    Trochanteric bursitis of  both hips    + injections by ortho in the past   Vertigo 2020/21   6+ wks, ->to ENT->?vestib neuronitis with ongoing motion sensitivity, gradually resolving.(Dr. Lazarus Salines 10/18/19). MRI brain normal 09/2020.   Vitamin D deficiency    Xerostomia    and xerophthalmia--per Dr. Lazarus Salines (he suggests SSA, SSB, ESR, rh factor, ANA, and antimicrosmal and antithyroglobulin ab's as of 02/17/16--all these labs were NORMAL.    Past Surgical History:  Procedure Laterality Date   ABDOMINAL HYSTERECTOMY     for prolapse with abdominal incision   BACK SURGERY  01/23/2015   Lumbar  decompression with L2-L5 fusion (Dr. Dutch Quint)   CARDIAC CATHETERIZATION     no PCI   CHOLECYSTECTOMY     COLONOSCOPY  04/2006;07/16/19   Diverticulosis, o/w normal.  06/2019->2 adenomas, +Diverticulosis.  Recall 2023-2025.   DEXA  04/06/2018   T score -1.5 femur neck, -2.2 radius.  FRAX 10%/1.5%.  10/2021 T score -1.6 radius, -0.9 femur. rpt 2 yrs   ESOPHAGOGASTRODUODENOSCOPY  05/29/2009; 03/01/17;07/16/19   Esoph normal.  Gastritis (H pylori NEG) + gastric polyps (benign).  Duodenal biopsy NORMAL.  2018-mild chronic gastritis, H pylori NEG. 06/2019 tortuous esophagus w/out stenosis +chronic gastritis H pyl neg.   EYE SURGERY Left    "lazy eye"   HAND SURGERY Right    TRIGGER FINGER; index finger   HERNIA REPAIR     umbilical   KNEE ARTHROSCOPY     RIGHT KNEE X 5   LAMINOTOMY  2010   L-4, L-5 FUSION   NASAL SINUS SURGERY     polypectomy (remote past)   OOPHORECTOMY  2010   Laparoscopic BSO (path benign)   PATELLA FRACTURE SURGERY  06/2007   Dr. Charlann Boxer   POSTERIOR LAMINECTOMY / DECOMPRESSION LUMBAR SPINE  05/2008   Dr. Jordan Likes   TOTAL KNEE ARTHROPLASTY Right 12-07 and 4-07   Dr. Shelle Iron, revision 12-09 Dr. Eileen Stanford at [facility]    Outpatient Medications Prior to Visit  Medication Sig Dispense Refill   ALPRAZolam (XANAX) 1 MG tablet TAKE 1 TO 2 TABLETS BY MOUTH TWICE DAILY AS NEEDED 120 tablet 5   Azelastine HCl (ASTEPRO) 0.15 % SOLN 2 sprays each nostril every 12 hours 30 mL 11   calcium carbonate (OSCAL) 1500 (600 Ca) MG TABS tablet Take by mouth daily.     Cholecalciferol (VITAMIN D3) 5000 units CAPS Take 4 capsules by mouth daily.      diclofenac Sodium (VOLTAREN) 1 % GEL Apply 4 g topically 4 (four) times daily.     diphenhydrAMINE-APAP, sleep, (TYLENOL PM EXTRA STRENGTH PO) Take by mouth as needed.     hydrocortisone 2.5 % cream Apply 1 application  topically as needed. RECTAL ITCHING     levothyroxine (SYNTHROID) 75 MCG tablet TAKE 1 TABLET BY MOUTH EVERY DAY 90 tablet 1   metoprolol tartrate  (LOPRESSOR) 25 MG tablet TAKE 1 TABLET BY MOUTH TWICE A DAY 180 tablet 1   Multiple Vitamins-Iron (MULTIVITAMIN/IRON PO) Take 1 tablet by mouth daily.     NIACIN PO Take by mouth daily.     omeprazole (PRILOSEC) 40 MG capsule Take 1 capsule (40 mg total) by mouth daily. 90 capsule 3   Oxycodone HCl 10 MG TABS 10 mg every 4 (four) hours as needed.     polyethylene glycol (MIRALAX / GLYCOLAX) 17 g packet Take 17 g by mouth 2 (two) times daily.     promethazine (PHENERGAN) 25 MG tablet TAKE 1 TABLET BY MOUTH EVERY 6 HOURS  AS NEEDED FOR NAUSEA AND VOMITING 90 tablet 2   simvastatin (ZOCOR) 20 MG tablet Take 1 tablet (20 mg total) by mouth at bedtime. 90 tablet 3   traZODone (DESYREL) 50 MG tablet Take 25 mg by mouth at bedtime.     cyclobenzaprine (FLEXERIL) 5 MG tablet Take 5 mg by mouth at bedtime. As needed (Patient not taking: Reported on 01/04/2024)     meclizine (ANTIVERT) 25 MG tablet TAKE 1 TABLET BY MOUTH THREE TIMES DAILY AS NEEDED FOR DIZZINESS (Patient not taking: Reported on 01/04/2024) 45 tablet 1   methylPREDNISolone (MEDROL DOSEPAK) 4 MG TBPK tablet Follow package insert (Patient not taking: Reported on 01/04/2024) 21 each 0   No facility-administered medications prior to visit.    Allergies  Allergen Reactions   Cymbalta [Duloxetine Hcl]     Facial edema   Iohexol      Code: HIVES, Desc: pt states her face and throat swells when administered IV contrast - KB, Onset Date: [id number]    Mepergan [Meperidine-Promethazine]     Cardiac arrest   Morphine Rash   Augmentin [Amoxicillin-Pot Clavulanate] Nausea Only   Citalopram Other (See Comments)    Jittery/heart racing    Hctz [Hydrochlorothiazide] Other (See Comments)    Too much urinating, felt dried out, etc--NO ALLERGY   Lexapro [Escitalopram Oxalate] Other (See Comments)    Jittery,heart racing   Lyrica [Pregabalin] Swelling    Face, hands, fingers, ankles   Promethazine Hcl    Valsartan     REACTION: pt states INTOL to  Diovan    Review of Systems As per HPI  PE:    01/04/2024    1:27 PM 12/14/2023    5:21 PM 12/14/2023   12:20 PM  Vitals with BMI  Height 5' 7.5"    Weight 190 lbs 3 oz    BMI 29.33    Systolic 127 128 478  Diastolic 75 71 85  Pulse 58 64 65     Physical Exam  Gen: Alert, well appearing.  Patient is oriented to person, place, time, and situation. No further exam today  LABS:  Last CBC Lab Results  Component Value Date   WBC 7.4 12/14/2023   HGB 14.1 12/14/2023   HCT 43.6 12/14/2023   MCV 85.5 12/14/2023   MCH 27.6 12/14/2023   RDW 12.9 12/14/2023   PLT 233 12/14/2023   Last metabolic panel Lab Results  Component Value Date   GLUCOSE 119 (H) 12/14/2023   NA 140 12/14/2023   K 4.3 12/14/2023   CL 103 12/14/2023   CO2 27 12/14/2023   BUN 30 (H) 12/14/2023   CREATININE 0.93 12/14/2023   GFRNONAA >60 12/14/2023   CALCIUM 9.6 12/14/2023   PROT 6.5 07/18/2023   ALBUMIN 4.4 07/18/2023   LABGLOB 3.0 07/18/2023   AGRATIO 1.2 07/18/2023   BILITOT 0.4 07/18/2023   ALKPHOS 96 07/18/2023   AST 14 (L) 07/18/2023   ALT 13 07/18/2023   ANIONGAP 10 12/14/2023   Last lipids Lab Results  Component Value Date   CHOL 289 (H) 10/22/2021   HDL 32.60 (L) 10/22/2021   LDLCALC 123 (H) 04/21/2021   LDLDIRECT 207.0 10/22/2021   TRIG 310.0 (H) 10/22/2021   CHOLHDL 9 10/22/2021   Last thyroid functions Lab Results  Component Value Date   TSH 1.53 11/04/2023   T3TOTAL 137 06/26/2020   Last vitamin D Lab Results  Component Value Date   VD25OH 31.12 11/04/2023   Last vitamin B12 and Folate Lab  Results  Component Value Date   VITAMINB12 310 04/06/2018   FOLATE 6.8 07/23/2009   IMPRESSION AND PLAN:  1 GAD with superimposed adjustment disorder with mixed anxious and depressed mood. Coping appropriately but understandably she is struggling a lot. Empathic listening today. No changes in treatment. She knows she can come back anytime she needs to.  Otherwise we will  see her back in a month to recheck. Continue alprazolam 1 mg, 1-2 twice daily as needed. Controlled substance contract is up-to-date.   #2 hypothyroidism. Her last TSH was 1.53 on 11/05/23. Continue 75 mcg levothyroxine daily.  #3 chronic pain syndrome. Fibromyalgia and osteoarthritis of multiple sites. Opioids managed by pain management physician.  Spent 31 min with pt today reviewing HPI, reviewing relevant past history, doing exam, reviewing and discussing lab and imaging data, and formulating plans.  An After Visit Summary was printed and given to the patient.  FOLLOW UP: Return in about 4 weeks (around 02/01/2024) for routine chronic illness f/u.  Signed:  Arletha Lady, MD           01/04/2024

## 2024-01-09 ENCOUNTER — Other Ambulatory Visit (HOSPITAL_BASED_OUTPATIENT_CLINIC_OR_DEPARTMENT_OTHER): Payer: Self-pay | Admitting: Family Medicine

## 2024-01-09 DIAGNOSIS — Z1231 Encounter for screening mammogram for malignant neoplasm of breast: Secondary | ICD-10-CM

## 2024-01-16 ENCOUNTER — Inpatient Hospital Stay (HOSPITAL_BASED_OUTPATIENT_CLINIC_OR_DEPARTMENT_OTHER): Payer: Medicare Other | Admitting: Hematology & Oncology

## 2024-01-16 ENCOUNTER — Encounter: Payer: Self-pay | Admitting: Hematology & Oncology

## 2024-01-16 ENCOUNTER — Inpatient Hospital Stay: Payer: Medicare Other | Attending: Family

## 2024-01-16 ENCOUNTER — Inpatient Hospital Stay (HOSPITAL_BASED_OUTPATIENT_CLINIC_OR_DEPARTMENT_OTHER): Admission: RE | Admit: 2024-01-16 | Source: Ambulatory Visit

## 2024-01-16 ENCOUNTER — Encounter (HOSPITAL_BASED_OUTPATIENT_CLINIC_OR_DEPARTMENT_OTHER): Payer: Self-pay

## 2024-01-16 ENCOUNTER — Ambulatory Visit (HOSPITAL_BASED_OUTPATIENT_CLINIC_OR_DEPARTMENT_OTHER)
Admission: RE | Admit: 2024-01-16 | Discharge: 2024-01-16 | Disposition: A | Discharge status: Home / Self Care | Source: Ambulatory Visit | Attending: Family Medicine | Admitting: Family Medicine

## 2024-01-16 VITALS — BP 132/67 | HR 67 | Temp 97.9°F | Resp 20 | Ht 67.5 in | Wt 195.8 lb

## 2024-01-16 DIAGNOSIS — Z1231 Encounter for screening mammogram for malignant neoplasm of breast: Secondary | ICD-10-CM | POA: Diagnosis not present

## 2024-01-16 DIAGNOSIS — D500 Iron deficiency anemia secondary to blood loss (chronic): Secondary | ICD-10-CM | POA: Diagnosis not present

## 2024-01-16 DIAGNOSIS — D509 Iron deficiency anemia, unspecified: Secondary | ICD-10-CM | POA: Insufficient documentation

## 2024-01-16 DIAGNOSIS — D472 Monoclonal gammopathy: Secondary | ICD-10-CM

## 2024-01-16 LAB — CMP (CANCER CENTER ONLY)
ALT: 16 U/L (ref 0–44)
AST: 13 U/L — ABNORMAL LOW (ref 15–41)
Albumin: 4.3 g/dL (ref 3.5–5.0)
Alkaline Phosphatase: 81 U/L (ref 38–126)
Anion gap: 9 (ref 5–15)
BUN: 32 mg/dL — ABNORMAL HIGH (ref 8–23)
CO2: 27 mmol/L (ref 22–32)
Calcium: 9.5 mg/dL (ref 8.9–10.3)
Chloride: 101 mmol/L (ref 98–111)
Creatinine: 1.07 mg/dL — ABNORMAL HIGH (ref 0.44–1.00)
GFR, Estimated: 53 mL/min — ABNORMAL LOW (ref >60)
Glucose, Bld: 120 mg/dL — ABNORMAL HIGH (ref 70–99)
Potassium: 4.3 mmol/L (ref 3.5–5.1)
Sodium: 137 mmol/L (ref 135–145)
Total Bilirubin: 0.5 mg/dL (ref 0.0–1.2)
Total Protein: 7.2 g/dL (ref 6.5–8.1)

## 2024-01-16 LAB — CBC WITH DIFFERENTIAL (CANCER CENTER ONLY)
Abs Immature Granulocytes: 0.02 10*3/uL (ref 0.00–0.07)
Basophils Absolute: 0 10*3/uL (ref 0.0–0.1)
Basophils Relative: 1 %
Eosinophils Absolute: 0.3 10*3/uL (ref 0.0–0.5)
Eosinophils Relative: 4 %
HCT: 39.7 % (ref 36.0–46.0)
Hemoglobin: 13.4 g/dL (ref 12.0–15.0)
Immature Granulocytes: 0 %
Lymphocytes Relative: 35 %
Lymphs Abs: 2.3 10*3/uL (ref 0.7–4.0)
MCH: 28.8 pg (ref 26.0–34.0)
MCHC: 33.8 g/dL (ref 30.0–36.0)
MCV: 85.2 fL (ref 80.0–100.0)
Monocytes Absolute: 0.4 10*3/uL (ref 0.1–1.0)
Monocytes Relative: 6 %
Neutro Abs: 3.6 10*3/uL (ref 1.7–7.7)
Neutrophils Relative %: 54 %
Platelet Count: 200 10*3/uL (ref 150–400)
RBC: 4.66 MIL/uL (ref 3.87–5.11)
RDW: 12.8 % (ref 11.5–15.5)
WBC Count: 6.6 10*3/uL (ref 4.0–10.5)
nRBC: 0 % (ref 0.0–0.2)

## 2024-01-16 LAB — LACTATE DEHYDROGENASE: LDH: 134 U/L (ref 98–192)

## 2024-01-16 LAB — FERRITIN: Ferritin: 160 ng/mL (ref 11–307)

## 2024-01-16 NOTE — Progress Notes (Signed)
 Hematology and Oncology Follow Up Visit  [name] [id number] 19-Oct-1945 78 y.o. 10/05/2018   Principle Diagnosis:  1. IgM kappa monoclonal gammopathy of undetermined significance 2. Intermittent iron deficiency anemia  Current Therapy:   IV iron as indicated - Ferrlecit 250 mg IV given on 08/03/2023    Interim History:  [name] is here today for follow-up. We see her every 6 months.  I see a lot more often because she always comes in with her husband who has his own health issues.  She seems to be managing.  She really has had no specific complaints.  Again she is much more worried about her husband that she is worried about herself.  When she was last here 6 months ago, her iron saturation was only 10% so she went ahead and got some IV Ferrlecit.  When we last saw her, her monoclonal spike was 0.4 g/dL.  Her IgM level was 470 mg/dL.  The Kappa light chain was 2.4 mg/dL.  She is still trying to manage her husband.  I know that he gets very upset and gets somewhat angry.  She is managing otherwise.  She is eating okay.  She has had no nausea or vomiting.  She has had no change in bowel or bladder habits.  She has had no obvious bleeding.  Overall, I would have to say that her performance status is probably ECOG 1.   Wt Readings from Last 3 Encounters:  01/16/24 195 lb 12.8 oz (88.8 kg)  01/04/24 190 lb 3.2 oz (86.3 kg)  12/12/23 186 lb (84.4 kg)   Medications:  Allergies as of 10/05/2018       Reactions   Cymbalta  [duloxetine  Hcl]    Facial edema   Iohexol     Code: HIVES, Desc: pt states her face and throat swells when administered IV contrast - KB, Onset Date: [id number]   Mepergan [meperidine-promethazine ]    Cardiac arrest   Morphine Rash   Augmentin  [amoxicillin -pot Clavulanate] Nausea Only   Citalopram  Other (See Comments)   Jittery/heart racing   Hctz [hydrochlorothiazide ] Other (See Comments)   Too much urinating, felt dried out, etc--NO ALLERGY    Lexapro [escitalopram Oxalate] Other (See Comments)   Jittery,heart racing   Lyrica [pregabalin] Swelling   Face, hands, fingers, ankles   Valsartan    REACTION: pt states INTOL to Diovan        Medication List        Accurate as of October 05, 2018 12:55 PM. Always use your most recent med list.          acetaminophen  325 MG tablet Commonly known as:  TYLENOL  Take 650 mg by mouth every 6 (six) hours as needed.   ALIGN 4 MG Caps Take 1 capsule (4 mg total) by mouth daily.   ALPRAZolam  0.5 MG tablet Commonly known as:  XANAX  TAKE ONE-HALF TO ONE TABLET BY MOUTH THREE TIMES DAILY   Azelastine  HCl 0.15 % Soln Commonly known as:  ASTEPRO  2 sprays each nostril every 12 hours   calcium carbonate 1500 (600 Ca) MG Tabs tablet Commonly known as:  OSCAL Take by mouth daily.   dicyclomine  10 MG capsule Commonly known as:  BENTYL  Take 1 capsule (10 mg total) by mouth 3 (three) times daily before meals.   hydrocortisone  2.5 % cream Apply 1 application topically as needed. RECTAL ITCHING   levothyroxine  75 MCG tablet Commonly known as:  SYNTHROID , LEVOTHROID TAKE 1/2 (ONE-HALF) TABLET BY MOUTH ONCE DAILY  magnesium (amino acid chelate) 133 MG tablet Take 1 tablet by mouth daily.   meclizine  25 MG tablet Commonly known as:  ANTIVERT  Take 1 tablet (25 mg total) by mouth every 6 (six) hours as needed for dizziness.   MELATONIN PO Take by mouth.   meloxicam  7.5 MG tablet Commonly known as:  MOBIC  TAKE 2 TABLETS (15 MG TOTAL) BY MOUTH DAILY.   metoprolol  tartrate 25 MG tablet Commonly known as:  LOPRESSOR  Take 1 tablet (25 mg total) by mouth daily.   MULTIVITAMIN/IRON PO Take 1 tablet by mouth daily.   NIACIN  PO Take by mouth.   omeprazole  40 MG capsule Commonly known as:  PRILOSEC Take 1 capsule (40 mg total) by mouth daily.   Oxycodone  HCl 10 MG Tabs 10 mg every 4 (four) hours as needed. 1 every 6 hours for pain   promethazine  25 MG tablet Commonly  known as:  PHENERGAN  TAKE ONE TABLET BY MOUTH EVERY 6 HOURS AS NEEDED FOR NAUSEA AND VOMITING   RED YEAST RICE PO Take by mouth.   simvastatin  20 MG tablet Commonly known as:  ZOCOR  Take 1 tablet (20 mg total) by mouth at bedtime.   SLEEP AID 25 MG tablet Generic drug:  doxylamine (Sleep) Take 25 mg by mouth at bedtime as needed.   traZODone 50 MG tablet Commonly known as:  DESYREL Take 1 tablet by mouth at bedtime.   Vitamin D3 125 MCG (5000 UT) Caps Take 2 capsules by mouth daily.        Allergies:  Allergies  Allergen Reactions   Cymbalta  [Duloxetine  Hcl]     Facial edema   Iohexol      Code: HIVES, Desc: pt states her face and throat swells when administered IV contrast - KB, Onset Date: [id number]    Mepergan [Meperidine-Promethazine ]     Cardiac arrest   Morphine Rash   Augmentin  [Amoxicillin -Pot Clavulanate] Nausea Only   Citalopram  Other (See Comments)    Jittery/heart racing    Hctz [Hydrochlorothiazide ] Other (See Comments)    Too much urinating, felt dried out, etc--NO ALLERGY   Lexapro [Escitalopram Oxalate] Other (See Comments)    Jittery,heart racing   Lyrica [Pregabalin] Swelling    Face, hands, fingers, ankles   Valsartan     REACTION: pt states INTOL to Diovan    Past Medical History, Surgical history, Social history, and Family History were reviewed and updated.  Review of Systems: Review of Systems  Constitutional: Positive for malaise/fatigue.  Eyes: Positive for blurred vision.  Respiratory: Positive for wheezing.   Cardiovascular: Positive for palpitations.  Gastrointestinal: Positive for nausea.  Genitourinary: Positive for urgency.  Musculoskeletal: Positive for back pain, joint pain and myalgias.  Neurological: Positive for tingling.  Endo/Heme/Allergies: Negative.   Psychiatric/Behavioral: Negative.      Physical Exam:  weight is 174 lb (78.9 kg). Her oral temperature is 97.8 F (36.6 C). Her blood pressure is 135/52  (abnormal) and her pulse is 52 (abnormal). Her respiration is 18 and oxygen saturation is 98%.   Wt Readings from Last 3 Encounters:  10/05/18 174 lb (78.9 kg)  09/05/18 178 lb (80.7 kg)  07/24/18 174 lb 8 oz (79.2 kg)    Physical Exam Vitals signs reviewed.  HENT:     Head: Normocephalic and atraumatic.  Eyes:     Pupils: Pupils are equal, round, and reactive to light.  Neck:     Musculoskeletal: Normal range of motion.  Cardiovascular:     Rate and Rhythm:  Normal rate and regular rhythm.     Heart sounds: Normal heart sounds.  Pulmonary:     Effort: Pulmonary effort is normal.     Breath sounds: Normal breath sounds.  Abdominal:     General: Bowel sounds are normal.     Palpations: Abdomen is soft.  Musculoskeletal: Normal range of motion.        General: No tenderness or deformity.  Lymphadenopathy:     Cervical: No cervical adenopathy.  Skin:    General: Skin is warm and dry.     Findings: No erythema or rash.  Neurological:     Mental Status: She is alert and oriented to person, place, and time.  Psychiatric:        Behavior: Behavior normal.        Thought Content: Thought content normal.        Judgment: Judgment normal.     Lab Results  Component Value Date   WBC 14.1 (H) 10/05/2018   HGB 14.8 10/05/2018   HCT 45.9 10/05/2018   MCV 91.4 10/05/2018   PLT 260 10/05/2018   Lab Results  Component Value Date   FERRITIN 143 04/06/2018   IRON 31 (L) 04/06/2018   TIBC 284 04/06/2018   UIBC 253 04/06/2018   IRONPCTSAT 11 (L) 04/06/2018   Lab Results  Component Value Date   RETICCTPCT 1.2 01/20/2011   RBC 5.02 10/05/2018   RETICCTABS 52.2 01/20/2011   Lab Results  Component Value Date   KPAFRELGTCHN 17.3 04/06/2018   LAMBDASER 16.6 04/06/2018   KAPLAMBRATIO 1.04 04/06/2018   Lab Results  Component Value Date   IGGSERUM 667 (L) 04/06/2018   IGA 287 04/06/2018   IGMSERUM 630 (H) 04/06/2018   Lab Results  Component Value Date   TOTALPROTELP 6.7  04/06/2018   ALBUMINELP 3.3 04/06/2018   A1GS 0.2 04/06/2018   A2GS 0.9 04/06/2018   BETS 1.0 04/06/2018   BETA2SER 0.5 06/19/2015   GAMS 1.2 04/06/2018   MSPIKE 0.5 (H) 04/06/2018   SPEI Comment 10/06/2017     Chemistry      Component Value Date/Time   NA 142 10/05/2018 1108   NA 138 03/25/2017 1333   NA 138 11/25/2016 1147   K 3.8 10/05/2018 1108   K 4.3 03/25/2017 1333   K 3.8 11/25/2016 1147   CL 103 10/05/2018 1108   CL 104 03/25/2017 1333   CL 103 01/15/2010 0835   CO2 29 10/05/2018 1108   CO2 24 03/25/2017 1333   CO2 25 11/25/2016 1147   BUN 12 10/05/2018 1108   BUN 12 03/25/2017 1333   BUN 15.3 11/25/2016 1147   CREATININE 0.93 10/05/2018 1108   CREATININE 0.76 03/25/2017 1333   CREATININE 0.8 11/25/2016 1147      Component Value Date/Time   CALCIUM 9.4 10/05/2018 1108   CALCIUM 9.5 03/25/2017 1333   CALCIUM 9.2 11/25/2016 1147   ALKPHOS 104 10/05/2018 1108   ALKPHOS 111 03/25/2017 1333   ALKPHOS 99 11/25/2016 1147   AST 11 (L) 10/05/2018 1108   AST 24 11/25/2016 1147   ALT 14 10/05/2018 1108   ALT 30 11/25/2016 1147   BILITOT 0.5 10/05/2018 1108   BILITOT 0.61 11/25/2016 1147      Impression and Plan: [name] is a very pleasant 79 yo caucasian female with MGUS and history of iron deficiency.   We will have to see what the labs look like.  I will have to think that her iron should be  okay.  Hopefully, the monoclonal labs are holding steady.  Again I see her quite often as we see her husband.  We will go ahead and plan to get her back in 6 months.  Ivor Mars MD

## 2024-01-17 LAB — IRON AND IRON BINDING CAPACITY (CC-WL,HP ONLY)
Iron: 68 ug/dL (ref 28–170)
Saturation Ratios: 18 % (ref 10.4–31.8)
TIBC: 388 ug/dL (ref 250–450)
UIBC: 320 ug/dL (ref 148–442)

## 2024-01-17 LAB — IGG, IGA, IGM
IgA: 231 mg/dL (ref 64–422)
IgG (Immunoglobin G), Serum: 723 mg/dL (ref 586–1602)
IgM (Immunoglobulin M), Srm: 490 mg/dL — ABNORMAL HIGH (ref 26–217)

## 2024-01-17 LAB — KAPPA/LAMBDA LIGHT CHAINS
Kappa free light chain: 22.8 mg/L — ABNORMAL HIGH (ref 3.3–19.4)
Kappa, lambda light chain ratio: 1.1 (ref 0.26–1.65)
Lambda free light chains: 20.8 mg/L (ref 5.7–26.3)

## 2024-01-18 ENCOUNTER — Encounter: Payer: Self-pay | Admitting: *Deleted

## 2024-01-18 LAB — PROTEIN ELECTROPHORESIS, SERUM
A/G Ratio: 1.2 (ref 0.7–1.7)
Albumin ELP: 3.7 g/dL (ref 2.9–4.4)
Alpha-1-Globulin: 0.2 g/dL (ref 0.0–0.4)
Alpha-2-Globulin: 0.8 g/dL (ref 0.4–1.0)
Beta Globulin: 1.1 g/dL (ref 0.7–1.3)
Gamma Globulin: 1 g/dL (ref 0.4–1.8)
Globulin, Total: 3.1 g/dL (ref 2.2–3.9)
M-Spike, %: 0.4 g/dL — ABNORMAL HIGH
Total Protein ELP: 6.8 g/dL (ref 6.0–8.5)

## 2024-01-26 DIAGNOSIS — M961 Postlaminectomy syndrome, not elsewhere classified: Secondary | ICD-10-CM | POA: Diagnosis not present

## 2024-01-26 DIAGNOSIS — G894 Chronic pain syndrome: Secondary | ICD-10-CM | POA: Diagnosis not present

## 2024-01-26 DIAGNOSIS — M1712 Unilateral primary osteoarthritis, left knee: Secondary | ICD-10-CM | POA: Diagnosis not present

## 2024-01-26 DIAGNOSIS — K5900 Constipation, unspecified: Secondary | ICD-10-CM | POA: Diagnosis not present

## 2024-02-07 ENCOUNTER — Ambulatory Visit: Payer: Self-pay

## 2024-02-07 ENCOUNTER — Other Ambulatory Visit: Payer: Self-pay | Admitting: Family Medicine

## 2024-02-07 NOTE — Telephone Encounter (Signed)
  Chief Complaint: dizziness Symptoms: dizziness Frequency: x 1 day Pertinent Negatives: Patient denies nausea  Disposition: [] ED /[] Urgent Care (no appt availability in office) / [] Appointment(In office/virtual)/ []  [facility]/ [] Home Care/ [] Refused Recommended Disposition /[] [facility]/ [x]  Follow-up with PCP  Additional Notes: pt states that she is having a episode of vertigo. States she her Meclizine  is expired.  Pt states that its her normal vertigo. States dizziness started on last night when she got up to go to the bathroom. Pt doesn't want an appt want a new prescription for her Meclizine  [facility]   Copied from CRM [id number]. Topic: Clinical - Red Word Triage >> Feb 07, 2024  3:06 PM Artemio Larry wrote: Red Word that prompted transfer to Nurse Triage: Patient having dizziness and would like the meclizine  sent to her pharmacy Reason for Disposition  [1] MILD dizziness (e.g., vertigo; walking normally) AND [2] has been evaluated by doctor (or NP/PA) for this  Answer Assessment - Initial Assessment Questions 1. DESCRIPTION: "Describe your dizziness."     vertigo 2. VERTIGO: "Do you feel like either you or the room is spinning or tilting?"      yes 3. LIGHTHEADED: "Do you feel lightheaded?" (e.g., somewhat faint, woozy, weak upon standing)     dizziness 4. SEVERITY: "How bad is it?"  "Can you walk?"   - MILD: Feels slightly dizzy and unsteady, but is walking normally.   - MODERATE: Feels unsteady when walking, but not falling; interferes with normal activities (e.g., school, work).   - SEVERE: Unable to walk without falling, or requires assistance to walk without falling.     mild 5. ONSET:  "When did the dizziness begin?"     today 6. AGGRAVATING FACTORS: "Does anything make it worse?" (e.g., standing, change in head position)     Change in head position 7. CAUSE: "What do you think is causing the dizziness?"     vertigo 8. RECURRENT  SYMPTOM: "Have you had dizziness before?" If Yes, ask: "When was the last time?" "What happened that time?"     Its been a while 9. OTHER SYMPTOMS: "Do you have any other symptoms?" (e.g., headache, weakness, numbness, vomiting, earache)     no  Protocols used: Dizziness - Vertigo-A-AH

## 2024-02-08 ENCOUNTER — Telehealth: Payer: Self-pay

## 2024-02-08 MED ORDER — MECLIZINE HCL 25 MG PO TABS: ORAL_TABLET | ORAL | 1 refills | Status: DC

## 2024-02-08 MED ORDER — MECLIZINE HCL 25 MG PO TABS: ORAL_TABLET | ORAL | 1 refills | Status: AC

## 2024-02-08 NOTE — Telephone Encounter (Signed)
Pt advised refill sent to pharmacy. 

## 2024-02-08 NOTE — Telephone Encounter (Signed)
 See telephone encounter 5/20, rx sent.

## 2024-02-08 NOTE — Addendum Note (Signed)
 Addended by: Shelvia Dick on: 02/08/2024 09:32 AM   Modules accepted: Orders

## 2024-02-08 NOTE — Telephone Encounter (Signed)
 Okay, meclizine  prescription sent

## 2024-02-08 NOTE — Telephone Encounter (Signed)
 LVM for pt to return call. Medication was sent to her pharmacy, [facility]

## 2024-02-14 ENCOUNTER — Other Ambulatory Visit: Payer: Self-pay | Admitting: Family Medicine

## 2024-02-15 NOTE — Telephone Encounter (Signed)
 LVM for pt to return call, she is currently due for 4 week f/u

## 2024-02-22 DIAGNOSIS — G894 Chronic pain syndrome: Secondary | ICD-10-CM | POA: Diagnosis not present

## 2024-02-22 DIAGNOSIS — M961 Postlaminectomy syndrome, not elsewhere classified: Secondary | ICD-10-CM | POA: Diagnosis not present

## 2024-02-22 DIAGNOSIS — M1712 Unilateral primary osteoarthritis, left knee: Secondary | ICD-10-CM | POA: Diagnosis not present

## 2024-02-22 DIAGNOSIS — K5900 Constipation, unspecified: Secondary | ICD-10-CM | POA: Diagnosis not present

## 2024-02-23 ENCOUNTER — Ambulatory Visit: Admitting: Family Medicine

## 2024-03-19 ENCOUNTER — Ambulatory Visit: Admitting: Family Medicine

## 2024-03-26 ENCOUNTER — Ambulatory Visit: Admitting: Family Medicine

## 2024-04-04 ENCOUNTER — Ambulatory Visit (INDEPENDENT_AMBULATORY_CARE_PROVIDER_SITE_OTHER): Admitting: Family Medicine

## 2024-04-04 ENCOUNTER — Encounter: Payer: Self-pay | Admitting: Family Medicine

## 2024-04-04 VITALS — BP 123/73 | HR 71 | Temp 97.9°F | Ht 67.5 in | Wt 197.0 lb

## 2024-04-04 DIAGNOSIS — F411 Generalized anxiety disorder: Secondary | ICD-10-CM

## 2024-04-04 DIAGNOSIS — T402X5A Adverse effect of other opioids, initial encounter: Secondary | ICD-10-CM | POA: Diagnosis not present

## 2024-04-04 DIAGNOSIS — K5903 Drug induced constipation: Secondary | ICD-10-CM | POA: Diagnosis not present

## 2024-04-04 DIAGNOSIS — Z79899 Other long term (current) drug therapy: Secondary | ICD-10-CM

## 2024-04-04 MED ORDER — NALOXEGOL OXALATE 12.5 MG PO TABS: 12.5000 mg | ORAL_TABLET | Freq: Every day | ORAL | 3 refills | Status: AC

## 2024-04-04 MED ORDER — LEVOTHYROXINE SODIUM 75 MCG PO TABS: 75.0000 ug | ORAL_TABLET | Freq: Every day | ORAL | 3 refills | Status: AC

## 2024-04-04 MED ORDER — METOPROLOL TARTRATE 25 MG PO TABS: 25.0000 mg | ORAL_TABLET | Freq: Two times a day (BID) | ORAL | 3 refills | Status: DC

## 2024-04-04 NOTE — Progress Notes (Signed)
 OFFICE VISIT  04/21/2024  CC: No chief complaint on file.   Patient is a 78 y.o. female who presents for 17-month follow-up anxiety. A/P as of last visit: 1 GAD with superimposed adjustment disorder with mixed anxious and depressed mood. Coping appropriately but understandably she is struggling a lot. Empathic listening today. No changes in treatment. She knows she can come back anytime she needs to.  Otherwise we will see her back in a month to recheck. Continue alprazolam  1 mg, 1-2 twice daily as needed. Controlled substance contract is up-to-date.   #2 hypothyroidism. Her last TSH was 1.53 on 11/05/23. Continue 75 mcg levothyroxine  daily.   #3 chronic pain syndrome. Fibromyalgia and osteoarthritis of multiple sites. Opioids managed by pain management physician.  INTERIM HX: Chronic severe anxiety unchanged. She is chronically struggling to cope with the fact that her husband has advanced liver cancer and is not going to be with her pretty soon.  Also chronic pain.  She has significant constipation: Her BMs are infrequent and consist mainly of hard marbles. She drinks prune juice daily.  MiraLAX has never helped.  Says she does an enema once a week.   PMP AWARE reviewed today: most recent rx for alprazolam  was filled 02/14/24, # 120, rx by me.  Dr. Cozetta rx's her pain medication. No red flags.   Past Medical History:  Diagnosis Date   Allergic rhinitis    Anxiety    Chronic gastritis    dx'd by EGD 2018 (erosive, likely NSAID-induced). 06/2019 +gastritis.   Chronic pain syndrome    right knee osteo, hx of TKA in R knee.  Pt was told by [name] that no further surgical options are available for her; also, chronic lower back pain.--[name].   Chronic renal insufficiency, stage II (mild) 2015   vs acute fluctuations depending on hydration?---GFR from 60s up to 80s.   Diverticulosis of colon    with hx of 'itis   DJD (degenerative joint disease)     L spine and knees.  R knee inject 03/15/18, L knee inject 12/26/18 (by Dr. Gaspar).   FATIGUE / MALAISE 07/01/2009   Chronic   GERD (gastroesophageal reflux disease)    History of colonic polyps    HTN (hypertension)    Hypercholesterolemia    recommended restart of simvastatin  07/2018   Hypothyroid 07/05/2012   IBS (irritable bowel syndrome)    IC (interstitial cystitis)    [name] at [facility]   Iron deficiency anemia, unspecified 06/27/2013   Has required IV iron on multiple occasions (Dr. Timmy).  Hemoccults neg 12/2016. Most recent hem f/u labs normal 09/2020.   Lumbar back pain    Spinal stenosis, lumbar region with neurogenic claudication L5-S1 sx's--ESI's no help.  CT L spine stable post-surg appearance as of 12/2019   Major depression, recurrent (HCC)    vs dysthymia,chronic   Memory impairment 10/03/2012   Memory impairment 10/03/2012 >referral to neuro     Monoclonal gammopathy of undetermined significance    IgM kappa MGUS (Dr. Timmy) routine f/u has shown stability of this problem (most recent f/u 09/2020)   OSA (obstructive sleep apnea)    Latest sleep study 01/2017.  Some adjustments to CPAP being made+ dental referral for consideration of oral appliance --per pulm MD.   Osteopenia 04/2014; 03/2018   2015: T score -2.0 FRAX 11%/1.8%.  2019 FRAX 10%/1.5%.  10/2021 T score -1.6 radius, -0.9 femur   Palpitations    Psoriasis  Trochanteric bursitis of both hips    + injections by ortho in the past   Vertigo 2020/21   6+ wks, ->to ENT->?vestib neuronitis with ongoing motion sensitivity, gradually resolving.(Dr. Arlana 10/18/19). MRI brain normal 09/2020.   Vitamin D  deficiency    Xerostomia    and xerophthalmia--per Dr. Arlana (he suggests SSA, SSB, ESR, rh factor, ANA, and antimicrosmal and antithyroglobulin ab's as of 02/17/16--all these labs were NORMAL.    Past Surgical History:  Procedure Laterality Date   ABDOMINAL HYSTERECTOMY     for prolapse with abdominal  incision   BACK SURGERY  01/23/2015   Lumbar decompression with L2-L5 fusion (Dr. Malcolm)   CARDIAC CATHETERIZATION     no PCI   CHOLECYSTECTOMY     COLONOSCOPY  04/2006;07/16/19   Diverticulosis, o/w normal.  06/2019->2 adenomas, +Diverticulosis.  Recall 2023-2025.   DEXA  04/06/2018   T score -1.5 femur neck, -2.2 radius.  FRAX 10%/1.5%.  10/2021 T score -1.6 radius, -0.9 femur. rpt 2 yrs   ESOPHAGOGASTRODUODENOSCOPY  05/29/2009; 03/01/17;07/16/19   Esoph normal.  Gastritis (H pylori NEG) + gastric polyps (benign).  Duodenal biopsy NORMAL.  2018-mild chronic gastritis, H pylori NEG. 06/2019 tortuous esophagus w/out stenosis +chronic gastritis H pyl neg.   EYE SURGERY Left    lazy eye   HAND SURGERY Right    TRIGGER FINGER; index finger   HERNIA REPAIR     umbilical   KNEE ARTHROSCOPY     RIGHT KNEE X 5   LAMINOTOMY  2010   L-4, L-5 FUSION   NASAL SINUS SURGERY     polypectomy (remote past)   OOPHORECTOMY  2010   Laparoscopic BSO (path benign)   PATELLA FRACTURE SURGERY  06/2007   Dr. Ernie   POSTERIOR LAMINECTOMY / DECOMPRESSION LUMBAR SPINE  05/2008   Dr. Louis   TOTAL KNEE ARTHROPLASTY Right 12-07 and 4-07   Dr. Duwayne, revision 12-09 Dr. Jenna at [facility]    Outpatient Medications Prior to Visit  Medication Sig Dispense Refill   ALPRAZolam  (XANAX ) 1 MG tablet TAKE 1 TO 2 TABLETS BY MOUTH TWICE DAILY AS NEEDED 120 tablet 5   Cholecalciferol (VITAMIN D3) 5000 units CAPS Take 4 capsules by mouth daily.      diclofenac Sodium (VOLTAREN) 1 % GEL Apply 4 g topically 4 (four) times daily.     hydrocortisone  2.5 % cream Apply 1 application  topically as needed. RECTAL ITCHING     meclizine  (ANTIVERT ) 25 MG tablet TAKE 1 TABLET BY MOUTH THREE TIMES DAILY AS NEEDED FOR DIZZINESS 45 tablet 1   Multiple Vitamins-Iron (MULTIVITAMIN/IRON PO) Take 1 tablet by mouth daily.     omeprazole  (PRILOSEC) 40 MG capsule Take 1 capsule (40 mg total) by mouth daily. 90 capsule 3   Oxycodone  HCl 10 MG TABS  10 mg every 4 (four) hours as needed.     promethazine  (PHENERGAN ) 25 MG tablet TAKE 1 TABLET BY MOUTH EVERY 6 HOURS AS NEEDED FOR NAUSEA AND VOMITING 90 tablet 2   simvastatin  (ZOCOR ) 20 MG tablet Take 1 tablet (20 mg total) by mouth at bedtime. 90 tablet 3   Azelastine  HCl (ASTEPRO ) 0.15 % SOLN 2 sprays each nostril every 12 hours (Patient not taking: Reported on 04/04/2024) 30 mL 11   calcium carbonate (OSCAL) 1500 (600 Ca) MG TABS tablet Take by mouth daily. (Patient not taking: Reported on 04/04/2024)     traZODone (DESYREL) 50 MG tablet Take 25 mg by mouth at bedtime. (Patient not taking:  Reported on 04/04/2024)     diphenhydrAMINE -APAP, sleep, (TYLENOL  PM EXTRA STRENGTH PO) Take by mouth as needed. (Patient not taking: Reported on 04/04/2024)     levothyroxine  (SYNTHROID ) 75 MCG tablet TAKE 1 TABLET BY MOUTH EVERY DAY 90 tablet 1   metoprolol  tartrate (LOPRESSOR ) 25 MG tablet TAKE 1 TABLET BY MOUTH TWICE A DAY 60 tablet 0   NIACIN  PO Take by mouth daily. (Patient not taking: Reported on 04/04/2024)     polyethylene glycol (MIRALAX / GLYCOLAX) 17 g packet Take 17 g by mouth 2 (two) times daily. (Patient not taking: Reported on 04/04/2024)     No facility-administered medications prior to visit.    Allergies  Allergen Reactions   Cymbalta  [Duloxetine  Hcl]     Facial edema   Iohexol      Code: HIVES, Desc: pt states her face and throat swells when administered IV contrast - KB, Onset Date: 87907990    Mepergan [Meperidine-Promethazine ]     Cardiac arrest   Morphine Rash   Augmentin  [Amoxicillin -Pot Clavulanate] Nausea Only   Citalopram  Other (See Comments)    Jittery/heart racing    Hctz [Hydrochlorothiazide ] Other (See Comments)    Too much urinating, felt dried out, etc--NO ALLERGY   Lexapro [Escitalopram Oxalate] Other (See Comments)    Jittery,heart racing   Lyrica [Pregabalin] Swelling    Face, hands, fingers, ankles   Promethazine  Hcl    Valsartan     REACTION: pt states  INTOL to Diovan    Review of Systems As per HPI  PE:    04/10/2024    8:15 AM 04/10/2024    8:08 AM 04/10/2024    8:07 AM  Vitals with BMI  Height   5' 7  Weight   198 lbs 7 oz  BMI   31.07  Systolic 136 147   Diastolic 59 79   Pulse 66 73      Physical Exam  Gen: Alert, tired-appearing.  Patient is oriented to person, place, time, and situation. AFFECT: pleasant but sad and anxious.  He has lucid thought and speech. No further exam today.  LABS:  Last CBC Lab Results  Component Value Date   WBC 6.6 01/16/2024   HGB 13.4 01/16/2024   HCT 39.7 01/16/2024   MCV 85.2 01/16/2024   MCH 28.8 01/16/2024   RDW 12.8 01/16/2024   PLT 200 01/16/2024   Last metabolic panel Lab Results  Component Value Date   GLUCOSE 120 (H) 01/16/2024   NA 137 01/16/2024   K 4.3 01/16/2024   CL 101 01/16/2024   CO2 27 01/16/2024   BUN 32 (H) 01/16/2024   CREATININE 1.07 (H) 01/16/2024   GFRNONAA 53 (L) 01/16/2024   CALCIUM 9.5 01/16/2024   PROT 7.2 01/16/2024   ALBUMIN 4.3 01/16/2024   LABGLOB 3.1 01/16/2024   AGRATIO 1.2 01/16/2024   BILITOT 0.5 01/16/2024   ALKPHOS 81 01/16/2024   AST 13 (L) 01/16/2024   ALT 16 01/16/2024   ANIONGAP 9 01/16/2024   Last lipids Lab Results  Component Value Date   CHOL 289 (H) 10/22/2021   HDL 32.60 (L) 10/22/2021   LDLCALC 123 (H) 04/21/2021   LDLDIRECT 207.0 10/22/2021   TRIG 310.0 (H) 10/22/2021   CHOLHDL 9 10/22/2021   Last thyroid  functions Lab Results  Component Value Date   TSH 1.53 11/04/2023   T3TOTAL 137 06/26/2020   THYROIDAB <1 02/24/2016   Last vitamin D  Lab Results  Component Value Date   VD25OH 31.12 11/04/2023  Last vitamin B12 and Folate Lab Results  Component Value Date   VITAMINB12 310 04/06/2018   FOLATE 6.8 07/23/2009    IMPRESSION AND PLAN:  #1 GAD with superimposed adjustment disorder with mixed anxious and depressed mood. Coping appropriately but understandably she is struggling a lot. Empathic  listening today. No changes in treatment. She knows she can come back anytime she needs to.  Otherwise we will see her back in a month to recheck. Continue alprazolam  1 mg, 1-2 twice daily as needed. Controlled substance contract is up-to-date.  #2 opioid-induced constipation. Movantik  12.5 mg daily. (In review of her chart it appears we have tried Linzess  and Amitiza  back in 2022 but is not clear whether she did not continue these due to cost or ineffectiveness.)  An After Visit Summary was printed and given to the patient.  FOLLOW UP: Return in about 4 weeks (around 05/02/2024) for f/u constip.  Signed:  Gerlene Hockey, MD           04/21/2024

## 2024-04-06 ENCOUNTER — Telehealth: Payer: Self-pay

## 2024-04-06 NOTE — Telephone Encounter (Signed)
 Source  [name] (Patient)   Subject  [name] (Patient)   Topic  Clinical - Medical Advice    Communication  Reason for CRM: [facility] is taking ALPRAZolam  (XANAX ) 1 MG tablet and Oxycodone  HCl 10 MG TABS wanted to know If that was okay and to continue filling the XANAX         [phone]    Provider verbally made aware of concern and gave approval for filling Xanax . Pharmacy is currently closed for lunch but will provide update upon return from break.

## 2024-04-06 NOTE — Telephone Encounter (Signed)
 Spoke with pharmacy staff and advised per provider ok to fill Xanax 

## 2024-04-10 ENCOUNTER — Emergency Department (HOSPITAL_COMMUNITY)
Admission: EM | Admit: 2024-04-10 | Discharge: 2024-04-10 | Disposition: A | Discharge status: Home / Self Care | Attending: Emergency Medicine | Admitting: Emergency Medicine

## 2024-04-10 ENCOUNTER — Other Ambulatory Visit: Payer: Self-pay

## 2024-04-10 ENCOUNTER — Encounter (HOSPITAL_COMMUNITY): Payer: Self-pay

## 2024-04-10 ENCOUNTER — Emergency Department (HOSPITAL_COMMUNITY)

## 2024-04-10 DIAGNOSIS — I6782 Cerebral ischemia: Secondary | ICD-10-CM | POA: Diagnosis not present

## 2024-04-10 DIAGNOSIS — W1830XA Fall on same level, unspecified, initial encounter: Secondary | ICD-10-CM | POA: Diagnosis not present

## 2024-04-10 DIAGNOSIS — M47812 Spondylosis without myelopathy or radiculopathy, cervical region: Secondary | ICD-10-CM | POA: Diagnosis not present

## 2024-04-10 DIAGNOSIS — M25552 Pain in left hip: Secondary | ICD-10-CM | POA: Insufficient documentation

## 2024-04-10 DIAGNOSIS — M4802 Spinal stenosis, cervical region: Secondary | ICD-10-CM | POA: Diagnosis not present

## 2024-04-10 DIAGNOSIS — M542 Cervicalgia: Secondary | ICD-10-CM | POA: Diagnosis not present

## 2024-04-10 DIAGNOSIS — I10 Essential (primary) hypertension: Secondary | ICD-10-CM | POA: Insufficient documentation

## 2024-04-10 DIAGNOSIS — S199XXA Unspecified injury of neck, initial encounter: Secondary | ICD-10-CM | POA: Diagnosis not present

## 2024-04-10 DIAGNOSIS — Z981 Arthrodesis status: Secondary | ICD-10-CM | POA: Diagnosis not present

## 2024-04-10 DIAGNOSIS — S0990XA Unspecified injury of head, initial encounter: Secondary | ICD-10-CM | POA: Diagnosis not present

## 2024-04-10 DIAGNOSIS — Z79899 Other long term (current) drug therapy: Secondary | ICD-10-CM | POA: Diagnosis not present

## 2024-04-10 DIAGNOSIS — M5136 Other intervertebral disc degeneration, lumbar region with discogenic back pain only: Secondary | ICD-10-CM | POA: Diagnosis not present

## 2024-04-10 DIAGNOSIS — M545 Low back pain, unspecified: Secondary | ICD-10-CM | POA: Diagnosis not present

## 2024-04-10 DIAGNOSIS — M4187 Other forms of scoliosis, lumbosacral region: Secondary | ICD-10-CM | POA: Diagnosis not present

## 2024-04-10 DIAGNOSIS — M5459 Other low back pain: Secondary | ICD-10-CM | POA: Diagnosis not present

## 2024-04-10 MED ORDER — OXYCODONE-ACETAMINOPHEN 5-325 MG PO TABS
1.0000 | ORAL_TABLET | Freq: Once | ORAL | Status: AC
  Administered 2024-04-10: 1 via ORAL
  Filled 2024-04-10: qty 1

## 2024-04-10 NOTE — Discharge Instructions (Signed)
 Recommend lidocaine  patches, Voltaren gel Tylenol  and follow-up with your primary care doctor.

## 2024-04-10 NOTE — ED Triage Notes (Signed)
 Pt states she was trying to help her husband who started falling and then she fell onto hardwood floors. Pt did not hit head or have LOC. Pt does not take blood thinners. Pt c/o left hip and lower back pain. Pt states she was able to walk w/pain after fall.

## 2024-04-10 NOTE — ED Notes (Signed)
 Patient Alert and oriented to baseline. Stable and ambulatory to baseline. Patient verbalized understanding of the discharge instructions.  Patient belongings were taken by the patient.

## 2024-04-10 NOTE — ED Provider Notes (Signed)
 [place] EMERGENCY DEPARTMENT AT [facility] Provider Note   CSN: [id number] Arrival date & time: 04/10/24  9244     Patient presents with: [place]   [name] is a 78 y.o. female.   Patient here after a fall last night.  She was helping her husband to go to the bathroom when she fell landed on her left side.  She did not think she hit her head or lost consciousness.  She is having some left hip pain low back pain.  She is able to walk after the fall.  History of arthritis hypertension high cholesterol IBS.  History of bursitis of both hips.  Denies any weakness numbness tingling.  Denies any headache.  She has some mild neck pain.  The history is provided by the patient.       Prior to Admission medications   Medication Sig Start Date End Date Taking? Authorizing Provider  ALPRAZolam  (XANAX ) 1 MG tablet TAKE 1 TO 2 TABLETS BY MOUTH TWICE DAILY AS NEEDED 11/28/23   McGowen, Aleene DEL, MD  Azelastine  HCl (ASTEPRO ) 0.15 % SOLN 2 sprays each nostril every 12 hours Patient not taking: Reported on 04/04/2024 07/19/14   McGowen, Philip H, MD  calcium carbonate (OSCAL) 1500 (600 Ca) MG TABS tablet Take by mouth daily. Patient not taking: Reported on 04/04/2024    [provider]  Cholecalciferol (VITAMIN D3) 5000 units CAPS Take 4 capsules by mouth daily.     [provider]  diclofenac Sodium (VOLTAREN) 1 % GEL Apply 4 g topically 4 (four) times daily. 09/03/20   [provider]  hydrocortisone  2.5 % cream Apply 1 application  topically as needed. RECTAL ITCHING 09/08/12   [provider]  levothyroxine  (SYNTHROID ) 75 MCG tablet Take 1 tablet (75 mcg total) by mouth daily. 04/04/24   McGowen, Aleene DEL, MD  meclizine  (ANTIVERT ) 25 MG tablet TAKE 1 TABLET BY MOUTH THREE TIMES DAILY AS NEEDED FOR DIZZINESS 02/08/24   McGowen, Aleene DEL, MD  metoprolol  tartrate (LOPRESSOR ) 25 MG tablet Take 1 tablet (25 mg total) by mouth 2 (two) times daily.  04/04/24   McGowen, Aleene DEL, MD  Multiple Vitamins-Iron (MULTIVITAMIN/IRON PO) Take 1 tablet by mouth daily.    [provider]  naloxegol  oxalate (MOVANTIK ) 12.5 MG TABS tablet Take 1 tablet (12.5 mg total) by mouth daily. 04/04/24   McGowen, Aleene DEL, MD  omeprazole  (PRILOSEC) 40 MG capsule Take 1 capsule (40 mg total) by mouth daily. 11/04/23   McGowen, Aleene DEL, MD  Oxycodone  HCl 10 MG TABS 10 mg every 4 (four) hours as needed. 12/01/15   [provider]  promethazine  (PHENERGAN ) 25 MG tablet TAKE 1 TABLET BY MOUTH EVERY 6 HOURS AS NEEDED FOR NAUSEA AND VOMITING 02/05/22   McGowen, Aleene DEL, MD  simvastatin  (ZOCOR ) 20 MG tablet Take 1 tablet (20 mg total) by mouth at bedtime. 11/04/23   McGowen, Aleene DEL, MD  traZODone (DESYREL) 50 MG tablet Take 25 mg by mouth at bedtime. Patient not taking: Reported on 04/04/2024 10/06/23   [provider]    Allergies: Cymbalta  [duloxetine  hcl], Iohexol, Mepergan [meperidine-promethazine ], Morphine, Augmentin  [amoxicillin -pot clavulanate], Citalopram , Hctz [hydrochlorothiazide ], Lexapro [escitalopram oxalate], Lyrica [pregabalin], Promethazine  hcl, and Valsartan    Review of Systems  Updated Vital Signs BP (!) 136/59   Pulse 66   Temp (!) 97.5 F (36.4 C) (Oral)   Resp 20   Ht 5' 7 (1.702 m)   Wt 90 kg  SpO2 98%   BMI 31.08 kg/m   Physical Exam Vitals and nursing note reviewed.  Constitutional:      General: She is not in acute distress.    Appearance: She is well-developed. She is not ill-appearing.  HENT:     Head: Normocephalic and atraumatic.     Nose: Nose normal.     Mouth/Throat:     Mouth: Mucous membranes are moist.  Eyes:     Extraocular Movements: Extraocular movements intact.     Conjunctiva/sclera: Conjunctivae normal.     Pupils: Pupils are equal, round, and reactive to light.  Cardiovascular:     Rate and Rhythm: Normal rate and regular rhythm.     Pulses: Normal pulses.     Heart sounds: Normal  heart sounds. No murmur heard. Pulmonary:     Effort: Pulmonary effort is normal. No respiratory distress.     Breath sounds: Normal breath sounds.  Abdominal:     General: Abdomen is flat.     Palpations: Abdomen is soft.     Tenderness: There is no abdominal tenderness.  Musculoskeletal:        General: Tenderness present. No swelling.     Cervical back: Normal range of motion and neck supple. No tenderness.     Comments: Tenderness to the left hip, tenderness to the paraspinal lumbar muscles.,  No midline spinal tenderness  Skin:    General: Skin is warm and dry.     Capillary Refill: Capillary refill takes less than 2 seconds.  Neurological:     General: No focal deficit present.     Mental Status: She is alert and oriented to person, place, and time.     Cranial Nerves: No cranial nerve deficit.     Sensory: No sensory deficit.     Motor: No weakness.     Coordination: Coordination normal.  Psychiatric:        Mood and Affect: Mood normal.     (all labs ordered are listed, but only abnormal results are displayed) Labs Reviewed - No data to display  EKG: None  Radiology: CT Head Wo Contrast Result Date: 04/10/2024 CLINICAL DATA:  Provided history: Poly brain MRI 09/21/2020. Head CT 07/06/2019. Trauma, blunt. EXAM: CT HEAD WITHOUT CONTRAST CT CERVICAL SPINE WITHOUT CONTRAST TECHNIQUE: Multidetector CT imaging of the head and cervical spine was performed following the standard protocol without intravenous contrast. Multiplanar CT image reconstructions of the cervical spine were also generated. RADIATION DOSE REDUCTION: This exam was performed according to the departmental dose-optimization program which includes automated exposure control, adjustment of the mA and/or kV according to patient size and/or use of iterative reconstruction technique. COMPARISON:  Cervical spine MRI 03/04/2009. FINDINGS: CT HEAD FINDINGS Brain: Mild generalized cerebral atrophy. Patchy and ill-defined  hypoattenuation within the cerebral white matter, nonspecific but compatible with minimal chronic small vessel ischemic disease. Prominent perivascular spaces within the left basal ganglia inferiorly. Unchanged 7 mm ovoid fat density focus along the dorsolateral aspect of the left thalamus, which may reflect a lipoma or dermoid cyst (for instance as seen on series 4, image 18). There is no acute intracranial hemorrhage. No demarcated cortical infarct. No extra-axial fluid collection. No midline shift. Vascular: No hyperdense vessel.  Atherosclerotic calcifications. Skull: No calvarial fracture or aggressive osseous lesion. Sinuses/Orbits: No mass or acute finding within the imaged orbits. Mild-to-moderate polypoid mucosal thickening within the right maxillary sinus. Minimally Kozel thickening within bilateral ethmoid air cells and within the left frontal sinus. Mild mucosal thickening  within the right frontal sinus. CT CERVICAL SPINE FINDINGS Alignment: Levocurvature of the cervical spine. 4 mm C4-C5 grade 1 anterolisthesis. Skull base and vertebrae: The basion-dental and atlanto-dental intervals are maintained.No evidence of acute fracture to the cervical spine. Facet ankylosis on the left at C5-6. Soft tissues and spinal canal: No prevertebral fluid or swelling. No visible canal hematoma. Disc levels: Cervical spondylosis with multilevel disc space narrowing, disc bulges, uncovertebral hypertrophy and facet arthropathy. Disc space narrowing is greatest at C5-C6 and C6-C7 (advanced at these levels). Facet arthropathy is advanced on the right at C4-C5. No appreciable high-grade spinal canal stenosis. Multilevel bony neural foraminal narrowing. Degenerative changes also present at the C1-C2 articulation. Upper chest: No consolidation within the imaged lung apices. No visible pneumothorax. IMPRESSION: CT head: 1. No evidence of an acute intracranial abnormality. 2. Minor chronic small vessel ischemic changes within  the cerebral white matter. 3. Unchanged 7 mm ovoid fat density focus along the dorsolateral aspect of the left thalamus, which may reflect a lipoma or dermoid cyst 4. Mild generalized cerebral atrophy. 5. Paranasal sinus disease as described. CT cervical spine: 1. No evidence of an acute cervical spine fracture. 2. 4 mm C4-C5 grade 1 anterolisthesis. 3. Levocurvature of the cervical spine. 4. Cervical spondylosis as described. 5. Facet ankylosis on the left at C5-C6. Electronically Signed   By: Rockey Childs D.O.   On: 04/10/2024 09:46   CT Cervical Spine Wo Contrast Result Date: 04/10/2024 CLINICAL DATA:  Provided history: Poly brain MRI 09/21/2020. Head CT 07/06/2019. Trauma, blunt. EXAM: CT HEAD WITHOUT CONTRAST CT CERVICAL SPINE WITHOUT CONTRAST TECHNIQUE: Multidetector CT imaging of the head and cervical spine was performed following the standard protocol without intravenous contrast. Multiplanar CT image reconstructions of the cervical spine were also generated. RADIATION DOSE REDUCTION: This exam was performed according to the departmental dose-optimization program which includes automated exposure control, adjustment of the mA and/or kV according to patient size and/or use of iterative reconstruction technique. COMPARISON:  Cervical spine MRI 03/04/2009. FINDINGS: CT HEAD FINDINGS Brain: Mild generalized cerebral atrophy. Patchy and ill-defined hypoattenuation within the cerebral white matter, nonspecific but compatible with minimal chronic small vessel ischemic disease. Prominent perivascular spaces within the left basal ganglia inferiorly. Unchanged 7 mm ovoid fat density focus along the dorsolateral aspect of the left thalamus, which may reflect a lipoma or dermoid cyst (for instance as seen on series 4, image 18). There is no acute intracranial hemorrhage. No demarcated cortical infarct. No extra-axial fluid collection. No midline shift. Vascular: No hyperdense vessel.  Atherosclerotic calcifications.  Skull: No calvarial fracture or aggressive osseous lesion. Sinuses/Orbits: No mass or acute finding within the imaged orbits. Mild-to-moderate polypoid mucosal thickening within the right maxillary sinus. Minimally Kozel thickening within bilateral ethmoid air cells and within the left frontal sinus. Mild mucosal thickening within the right frontal sinus. CT CERVICAL SPINE FINDINGS Alignment: Levocurvature of the cervical spine. 4 mm C4-C5 grade 1 anterolisthesis. Skull base and vertebrae: The basion-dental and atlanto-dental intervals are maintained.No evidence of acute fracture to the cervical spine. Facet ankylosis on the left at C5-6. Soft tissues and spinal canal: No prevertebral fluid or swelling. No visible canal hematoma. Disc levels: Cervical spondylosis with multilevel disc space narrowing, disc bulges, uncovertebral hypertrophy and facet arthropathy. Disc space narrowing is greatest at C5-C6 and C6-C7 (advanced at these levels). Facet arthropathy is advanced on the right at C4-C5. No appreciable high-grade spinal canal stenosis. Multilevel bony neural foraminal narrowing. Degenerative changes also present at the C1-C2  articulation. Upper chest: No consolidation within the imaged lung apices. No visible pneumothorax. IMPRESSION: CT head: 1. No evidence of an acute intracranial abnormality. 2. Minor chronic small vessel ischemic changes within the cerebral white matter. 3. Unchanged 7 mm ovoid fat density focus along the dorsolateral aspect of the left thalamus, which may reflect a lipoma or dermoid cyst 4. Mild generalized cerebral atrophy. 5. Paranasal sinus disease as described. CT cervical spine: 1. No evidence of an acute cervical spine fracture. 2. 4 mm C4-C5 grade 1 anterolisthesis. 3. Levocurvature of the cervical spine. 4. Cervical spondylosis as described. 5. Facet ankylosis on the left at C5-C6. Electronically Signed   By: Rockey Childs D.O.   On: 04/10/2024 09:46   DG Lumbar Spine  Complete Result Date: 04/10/2024 CLINICAL DATA:  Fall with low back pain. EXAM: LUMBAR SPINE - COMPLETE 4+ VIEW COMPARISON:  12/14/2023 FINDINGS: Mild convex leftward scoliosis. Bones are diffusely demineralized. No evidence for an acute fracture. Status post posterior fusion from L2-L5 5 with posterior fusion hardware from L2-L4. Marked loss of disc height with endplate degeneration noted at L1-2. SI joints are unremarkable. IMPRESSION: Status post posterior fusion from L2-L5. No evidence for an acute fracture. Electronically Signed   By: Camellia Candle M.D.   On: 04/10/2024 09:02   DG Hip Unilat With Pelvis 2-3 Views Left Result Date: 04/10/2024 CLINICAL DATA:  Fall with left hip pain. EXAM: DG HIP (WITH OR WITHOUT PELVIS) 2-3V LEFT COMPARISON:  None Available. FINDINGS: There is no evidence of hip fracture or dislocation. There is no evidence of arthropathy or other focal bone abnormality. IMPRESSION: Negative. Electronically Signed   By: Camellia Candle M.D.   On: 04/10/2024 09:01     Procedures   Medications Ordered in the ED  oxyCODONE -acetaminophen  (PERCOCET/ROXICET) 5-325 MG per tablet 1 tablet (1 tablet Oral Given 04/10/24 0818)                                    Medical Decision Making Amount and/or Complexity of Data Reviewed Radiology: ordered.  Risk Prescription drug management.   [name] is here after mechanical fall.  Unremarkable vitals.  No fever.  Not on blood thinners.  History of memory impairment interstitial cystitis bursitis of the hips.  Will get a CT scan of the head neck x-ray of the low back and left hip.  Clinically I have very low suspicion for fracture she is able to flex and extend and move the lower half without much difficulty.  But she is tender in this area.  Has somewhat chronic pain in her low back and her left hip at baseline as well as her neck.  She is neurologically intact.  She has been able to ambulate since the fall overnight but with  discomfort.  Will give her oxycodone  get some pictures and reevaluate.  CT scan of the head and neck are unremarkable.  X-rays are unremarkable for radiology report.  Overall I suspect contusion.  Recommend Tylenol , Voltaren, lidocaine  patches.  Patient discharged.  Understand return precautions.  This chart was dictated using voice recognition software.  Despite best efforts to proofread,  errors can occur which can change the documentation meaning.      Final diagnoses:  Acute low back pain, unspecified back pain laterality, unspecified whether sciatica present  Pain of left hip    ED Discharge Orders     None  Ruthe Cornet, DO 04/10/24 [phone]

## 2024-04-14 DIAGNOSIS — M1712 Unilateral primary osteoarthritis, left knee: Secondary | ICD-10-CM | POA: Diagnosis not present

## 2024-04-19 DIAGNOSIS — G894 Chronic pain syndrome: Secondary | ICD-10-CM | POA: Diagnosis not present

## 2024-04-19 DIAGNOSIS — M961 Postlaminectomy syndrome, not elsewhere classified: Secondary | ICD-10-CM | POA: Diagnosis not present

## 2024-04-19 DIAGNOSIS — M1712 Unilateral primary osteoarthritis, left knee: Secondary | ICD-10-CM | POA: Diagnosis not present

## 2024-04-19 DIAGNOSIS — K5900 Constipation, unspecified: Secondary | ICD-10-CM | POA: Diagnosis not present

## 2024-05-03 NOTE — Patient Instructions (Incomplete)
 SABRA

## 2024-05-10 ENCOUNTER — Ambulatory Visit: Admitting: Family Medicine

## 2024-05-17 DIAGNOSIS — K5900 Constipation, unspecified: Secondary | ICD-10-CM | POA: Diagnosis not present

## 2024-05-17 DIAGNOSIS — G894 Chronic pain syndrome: Secondary | ICD-10-CM | POA: Diagnosis not present

## 2024-05-17 DIAGNOSIS — M1712 Unilateral primary osteoarthritis, left knee: Secondary | ICD-10-CM | POA: Diagnosis not present

## 2024-05-17 DIAGNOSIS — M961 Postlaminectomy syndrome, not elsewhere classified: Secondary | ICD-10-CM | POA: Diagnosis not present

## 2024-05-25 ENCOUNTER — Encounter: Payer: Self-pay | Admitting: Family Medicine

## 2024-05-25 ENCOUNTER — Ambulatory Visit: Admitting: Family Medicine

## 2024-05-25 ENCOUNTER — Other Ambulatory Visit: Payer: Self-pay

## 2024-05-25 VITALS — BP 122/79 | HR 85 | Temp 98.2°F | Ht 67.0 in | Wt 191.2 lb

## 2024-05-25 DIAGNOSIS — F411 Generalized anxiety disorder: Secondary | ICD-10-CM

## 2024-05-25 DIAGNOSIS — K5903 Drug induced constipation: Secondary | ICD-10-CM

## 2024-05-25 DIAGNOSIS — E039 Hypothyroidism, unspecified: Secondary | ICD-10-CM | POA: Diagnosis not present

## 2024-05-25 DIAGNOSIS — Z79899 Other long term (current) drug therapy: Secondary | ICD-10-CM

## 2024-05-25 DIAGNOSIS — T402X5A Adverse effect of other opioids, initial encounter: Secondary | ICD-10-CM

## 2024-05-25 LAB — TSH: TSH: 1.46 u[IU]/mL (ref 0.35–5.50)

## 2024-05-25 MED ORDER — ALPRAZOLAM 1 MG PO TABS: ORAL_TABLET | ORAL | 5 refills | Status: DC

## 2024-05-25 NOTE — Telephone Encounter (Signed)
 Requesting: alprazolam  Contract: 08/05/23 UDS: 06/29/23 Last Refill: 11/28/23 (120,5)  Please Advise. Rx pending

## 2024-05-25 NOTE — Telephone Encounter (Signed)
Pt advised refill sent. °

## 2024-05-25 NOTE — Progress Notes (Signed)
 OFFICE VISIT  05/25/2024  CC:  Chief Complaint  Patient presents with   Medical Management of Chronic Issues    Patient is a 78 y.o. female who presents for 6-week follow-up anxiety and constipation. A/P as of last visit: #1 GAD with superimposed adjustment disorder with mixed anxious and depressed mood. Coping appropriately but understandably she is struggling a lot. Empathic listening today. No changes in treatment. She knows she can come back anytime she needs to.  Otherwise we will see her back in a month to recheck. Continue alprazolam  1 mg, 1-2 twice daily as needed. Controlled substance contract is up-to-date.   #2 opioid-induced constipation. Movantik  12.5 mg daily. (In review of her chart it appears we have tried Linzess  and Amitiza  back in 2022 but is not clear whether she did not continue these due to cost or ineffectiveness.)  INTERIM HX: Anxiety is pretty stable.  She has recently done very well with coping with this difficult situation.  She had to call 911 a few days ago because Larnell got very sick, had to be admitted to the hospital with sepsis.  She actually went home 1 night while he was in the hospital and this was the first night she has been alone in forever.  Her chronic pain persists. Her constipation has improved a little bit on the Movantik .  She says she can sense when she needs to have a bowel movement now, although her bowel movements are typically still pretty infrequent and hard.  She has been taking samples of Movantik  that her pain physician had given her at 1 point in the past.  She is not sure what strength pill.  PMP AWARE reviewed today: most recent rx for alprazolam  was filled 05/22/24, # 120, rx by me. No red flags.  Past Medical History:  Diagnosis Date   Allergic rhinitis    Anxiety    Chronic gastritis    dx'd by EGD 2018 (erosive, likely NSAID-induced). 06/2019 +gastritis.   Chronic pain syndrome    right knee osteo, hx of TKA in R knee.   Pt was told by [name] that no further surgical options are available for her; also, chronic lower back pain.--[name].   Chronic renal insufficiency, stage II (mild) 2015   vs acute fluctuations depending on hydration?---GFR from 60s up to 80s.   Diverticulosis of colon    with hx of 'itis   DJD (degenerative joint disease)    L spine and knees.  R knee inject 03/15/18, L knee inject 12/26/18 (by Dr. Gaspar).   FATIGUE / MALAISE 07/01/2009   Chronic   GERD (gastroesophageal reflux disease)    History of colonic polyps    HTN (hypertension)    Hypercholesterolemia    recommended restart of simvastatin  07/2018   Hypothyroid 07/05/2012   IBS (irritable bowel syndrome)    IC (interstitial cystitis)    [name] at [facility]   Iron deficiency anemia, unspecified 06/27/2013   Has required IV iron on multiple occasions (Dr. Timmy).  Hemoccults neg 12/2016. Most recent hem f/u labs normal 09/2020.   Lumbar back pain    Spinal stenosis, lumbar region with neurogenic claudication L5-S1 sx's--ESI's no help.  CT L spine stable post-surg appearance as of 12/2019   Major depression, recurrent (HCC)    vs dysthymia,chronic   Memory impairment 10/03/2012   Memory impairment 10/03/2012 >referral to neuro     Monoclonal gammopathy of undetermined significance    IgM kappa MGUS (Dr. Timmy)  routine f/u has shown stability of this problem (most recent f/u 09/2020)   OSA (obstructive sleep apnea)    Latest sleep study 01/2017.  Some adjustments to CPAP being made+ dental referral for consideration of oral appliance --per pulm MD.   Osteopenia 04/2014; 03/2018   2015: T score -2.0 FRAX 11%/1.8%.  2019 FRAX 10%/1.5%.  10/2021 T score -1.6 radius, -0.9 femur   Palpitations    Psoriasis    Trochanteric bursitis of both hips    + injections by ortho in the past   Vertigo 2020/21   6+ wks, ->to ENT->?vestib neuronitis with ongoing motion sensitivity, gradually resolving.(Dr. Arlana  10/18/19). MRI brain normal 09/2020.   Vitamin D  deficiency    Xerostomia    and xerophthalmia--per Dr. Arlana (he suggests SSA, SSB, ESR, rh factor, ANA, and antimicrosmal and antithyroglobulin ab's as of 02/17/16--all these labs were NORMAL.    Past Surgical History:  Procedure Laterality Date   ABDOMINAL HYSTERECTOMY     for prolapse with abdominal incision   BACK SURGERY  01/23/2015   Lumbar decompression with L2-L5 fusion (Dr. Malcolm)   CARDIAC CATHETERIZATION     no PCI   CHOLECYSTECTOMY     COLONOSCOPY  04/2006;07/16/19   Diverticulosis, o/w normal.  06/2019->2 adenomas, +Diverticulosis.  Recall 2023-2025.   DEXA  04/06/2018   T score -1.5 femur neck, -2.2 radius.  FRAX 10%/1.5%.  10/2021 T score -1.6 radius, -0.9 femur. rpt 2 yrs   ESOPHAGOGASTRODUODENOSCOPY  05/29/2009; 03/01/17;07/16/19   Esoph normal.  Gastritis (H pylori NEG) + gastric polyps (benign).  Duodenal biopsy NORMAL.  2018-mild chronic gastritis, H pylori NEG. 06/2019 tortuous esophagus w/out stenosis +chronic gastritis H pyl neg.   EYE SURGERY Left    lazy eye   HAND SURGERY Right    TRIGGER FINGER; index finger   HERNIA REPAIR     umbilical   KNEE ARTHROSCOPY     RIGHT KNEE X 5   LAMINOTOMY  2010   L-4, L-5 FUSION   NASAL SINUS SURGERY     polypectomy (remote past)   OOPHORECTOMY  2010   Laparoscopic BSO (path benign)   PATELLA FRACTURE SURGERY  06/2007   Dr. Ernie   POSTERIOR LAMINECTOMY / DECOMPRESSION LUMBAR SPINE  05/2008   Dr. Louis   TOTAL KNEE ARTHROPLASTY Right 12-07 and 4-07   Dr. Duwayne, revision 12-09 Dr. Jenna at [facility]    Outpatient Medications Prior to Visit  Medication Sig Dispense Refill   ALPRAZolam  (XANAX ) 1 MG tablet TAKE 1 TO 2 TABLETS BY MOUTH TWICE DAILY AS NEEDED 120 tablet 5   Cholecalciferol (VITAMIN D3) 5000 units CAPS Take 4 capsules by mouth daily.      diclofenac Sodium (VOLTAREN) 1 % GEL Apply 4 g topically 4 (four) times daily.     hydrocortisone  2.5 % cream Apply 1  application  topically as needed. RECTAL ITCHING     levothyroxine  (SYNTHROID ) 75 MCG tablet Take 1 tablet (75 mcg total) by mouth daily. 90 tablet 3   meclizine  (ANTIVERT ) 25 MG tablet TAKE 1 TABLET BY MOUTH THREE TIMES DAILY AS NEEDED FOR DIZZINESS 45 tablet 1   metoprolol  tartrate (LOPRESSOR ) 25 MG tablet Take 1 tablet (25 mg total) by mouth 2 (two) times daily. 180 tablet 3   Multiple Vitamins-Iron (MULTIVITAMIN/IRON PO) Take 1 tablet by mouth daily.     naloxegol  oxalate (MOVANTIK ) 12.5 MG TABS tablet Take 1 tablet (12.5 mg total) by mouth daily. 30 tablet 3   omeprazole  (PRILOSEC)  40 MG capsule Take 1 capsule (40 mg total) by mouth daily. 90 capsule 3   Oxycodone  HCl 10 MG TABS 10 mg every 4 (four) hours as needed.     promethazine  (PHENERGAN ) 25 MG tablet TAKE 1 TABLET BY MOUTH EVERY 6 HOURS AS NEEDED FOR NAUSEA AND VOMITING 90 tablet 2   simvastatin  (ZOCOR ) 20 MG tablet Take 1 tablet (20 mg total) by mouth at bedtime. 90 tablet 3   Azelastine  HCl (ASTEPRO ) 0.15 % SOLN 2 sprays each nostril every 12 hours (Patient not taking: Reported on 05/25/2024) 30 mL 11   calcium carbonate (OSCAL) 1500 (600 Ca) MG TABS tablet Take by mouth daily. (Patient not taking: Reported on 05/25/2024)     traZODone (DESYREL) 50 MG tablet Take 25 mg by mouth at bedtime. (Patient not taking: Reported on 05/25/2024)     No facility-administered medications prior to visit.    Allergies  Allergen Reactions   Cymbalta  [Duloxetine  Hcl]     Facial edema   Iohexol      Code: HIVES, Desc: pt states her face and throat swells when administered IV contrast - KB, Onset Date: 87907990    Mepergan [Meperidine-Promethazine ]     Cardiac arrest   Morphine Rash   Augmentin  [Amoxicillin -Pot Clavulanate] Nausea Only   Citalopram  Other (See Comments)    Jittery/heart racing    Hctz [Hydrochlorothiazide ] Other (See Comments)    Too much urinating, felt dried out, etc--NO ALLERGY   Lexapro [Escitalopram Oxalate] Other (See  Comments)    Jittery,heart racing   Lyrica [Pregabalin] Swelling    Face, hands, fingers, ankles   Promethazine  Hcl    Valsartan     REACTION: pt states INTOL to Diovan    Review of Systems As per HPI  PE:    05/25/2024    1:14 PM 04/10/2024    8:15 AM 04/10/2024    8:08 AM  Vitals with BMI  Height 5' 7    Weight 191 lbs 3 oz    BMI 29.94    Systolic 122 136 852  Diastolic 79 59 79  Pulse 85 66 73   Physical Exam  Gen: Alert, well appearing.  Patient is oriented to person, place, time, and situation. AFFECT: pleasant, lucid thought and speech. No further exam today  LABS:  Last thyroid  functions Lab Results  Component Value Date   TSH 1.53 11/04/2023   T3TOTAL 137 06/26/2020   THYROIDAB <1 02/24/2016    IMPRESSION AND PLAN:  #1 GAD with superimposed adjustment disorder with mixed anxious and depressed mood. Coping appropriately but understandably she is struggling a lot. Empathic listening today. No changes in treatment. She knows she can come back anytime she needs to.  Otherwise we will see her back in a month to recheck. Continue alprazolam  1 mg, 1-2 twice daily as needed. Controlled substance contract is up-to-date.   #2 opioid-induced constipation. Movantik  is helping.  She will check to see which strength pill are in her samples at home.  If they are 12.5 mg then I advised her to take 2 of them daily, and if they are 25 mg then she will take 1 daily.  (In review of her chart it appears we have tried Linzess  and Amitiza  back in 2022 but is not clear whether she did not continue these due to cost or ineffectiveness.)  #3 acquired hypothyroidism.  Has been doing well on 75 mcg levothyroxine  daily. Monitor TSH today.  I personally spent a total of 31 minutes  in the care of the patient today including preparing to see the patient, counseling and educating, placing orders, and documenting clinical information in the EHR.  An After Visit Summary was printed  and given to the patient.  FOLLOW UP: Return in about 4 weeks (around 06/22/2024) for routine chronic illness f/u.  Signed:  Gerlene Hockey, MD           05/25/2024

## 2024-05-25 NOTE — Patient Instructions (Signed)
 Take 25mg  of movantik  every day.

## 2024-05-27 ENCOUNTER — Ambulatory Visit: Payer: Self-pay | Admitting: Family Medicine

## 2024-06-15 DIAGNOSIS — M1712 Unilateral primary osteoarthritis, left knee: Secondary | ICD-10-CM | POA: Diagnosis not present

## 2024-06-22 ENCOUNTER — Ambulatory Visit (INDEPENDENT_AMBULATORY_CARE_PROVIDER_SITE_OTHER): Admitting: Family Medicine

## 2024-06-22 ENCOUNTER — Encounter: Payer: Self-pay | Admitting: Family Medicine

## 2024-06-22 VITALS — BP 149/83 | HR 69 | Temp 97.7°F | Ht 67.0 in | Wt 194.8 lb

## 2024-06-22 DIAGNOSIS — K5903 Drug induced constipation: Secondary | ICD-10-CM

## 2024-06-22 DIAGNOSIS — Z79899 Other long term (current) drug therapy: Secondary | ICD-10-CM | POA: Diagnosis not present

## 2024-06-22 DIAGNOSIS — F331 Major depressive disorder, recurrent, moderate: Secondary | ICD-10-CM | POA: Diagnosis not present

## 2024-06-22 DIAGNOSIS — F411 Generalized anxiety disorder: Secondary | ICD-10-CM | POA: Diagnosis not present

## 2024-06-22 DIAGNOSIS — T402X5A Adverse effect of other opioids, initial encounter: Secondary | ICD-10-CM | POA: Diagnosis not present

## 2024-06-22 DIAGNOSIS — E039 Hypothyroidism, unspecified: Secondary | ICD-10-CM | POA: Diagnosis not present

## 2024-06-22 NOTE — Progress Notes (Addendum)
 OFFICE VISIT  06/22/2024  CC:  Chief Complaint  Patient presents with   Medical Management of Chronic Issues    Patient is a 78 y.o. female who presents for 1 month follow-up anxiety and depression and opioid-induced constipation. A/P as of last visit: #1 GAD with superimposed adjustment disorder with mixed anxious and depressed mood. Coping appropriately but understandably she is struggling a lot. Empathic listening today. No changes in treatment. She knows she can come back anytime she needs to.  Otherwise we will see her back in a month to recheck. Continue alprazolam  1 mg, 1-2 twice daily as needed. Controlled substance contract is up-to-date.   #2 opioid-induced constipation. Movantik  is helping.  She will check to see which strength pill are in her samples at home.  If they are 12.5 mg then I advised her to take 2 of them daily, and if they are 25 mg then she will take 1 daily.   (In review of her chart it appears we have tried Linzess  and Amitiza  back in 2022 but is not clear whether she did not continue these due to cost or ineffectiveness.)   #3 acquired hypothyroidism.  Has been doing well on 75 mcg levothyroxine  daily. Monitor TSH today.  INTERIM HX: Still struggling quite a bit. Processing the continuing decline of her husband. Also dealing at the same time with her chronic pain and fatigue. Surprisingly, she recently went over a week without her pain medicine because of lack of refill from her pain management doctor.  She says she did not feel withdrawal but certainly her pain was worse. She is back on her medications now. She says that her orthopedic surgeon has said that he will do total knee arthroplasty at some point in the future and she is going to get a custom leg brace in the meantime.  She feels like her bowel movements are some improved -->more frequent and softer since getting on the Movantik  samples. Still occasionally has to give herself an  enema.  PMP AWARE reviewed today: most recent rx for alprazolam  was filled 05/22/24, # 120, rx by me. No red flags.   Past Medical History:  Diagnosis Date   Allergic rhinitis    Anxiety    Chronic gastritis    dx'd by EGD 2018 (erosive, likely NSAID-induced). 06/2019 +gastritis.   Chronic pain syndrome    right knee osteo, hx of TKA in R knee.  Pt was told by [name] that no further surgical options are available for her; also, chronic lower back pain.--[name].   Chronic renal insufficiency, stage II (mild) 2015   vs acute fluctuations depending on hydration?---GFR from 60s up to 80s.   Diverticulosis of colon    with hx of 'itis   DJD (degenerative joint disease)    L spine and knees.  R knee inject 03/15/18, L knee inject 12/26/18 (by Dr. Gaspar).   FATIGUE / MALAISE 07/01/2009   Chronic   GERD (gastroesophageal reflux disease)    History of colonic polyps    HTN (hypertension)    Hypercholesterolemia    recommended restart of simvastatin  07/2018   Hypothyroid 07/05/2012   IBS (irritable bowel syndrome)    IC (interstitial cystitis)    [name] at [facility]   Iron deficiency anemia, unspecified 06/27/2013   Has required IV iron on multiple occasions (Dr. Timmy).  Hemoccults neg 12/2016. Most recent hem f/u labs normal 09/2020.   Lumbar back pain    Spinal stenosis, lumbar  region with neurogenic claudication L5-S1 sx's--ESI's no help.  CT L spine stable post-surg appearance as of 12/2019   Major depression, recurrent    vs dysthymia,chronic   Memory impairment 10/03/2012   Memory impairment 10/03/2012 >referral to neuro     Monoclonal gammopathy of undetermined significance    IgM kappa MGUS (Dr. Timmy) routine f/u has shown stability of this problem (most recent f/u 09/2020)   OSA (obstructive sleep apnea)    Latest sleep study 01/2017.  Some adjustments to CPAP being made+ dental referral for consideration of oral appliance --per pulm MD.   Osteopenia  04/2014; 03/2018   2015: T score -2.0 FRAX 11%/1.8%.  2019 FRAX 10%/1.5%.  10/2021 T score -1.6 radius, -0.9 femur   Palpitations    Psoriasis    Trochanteric bursitis of both hips    + injections by ortho in the past   Vertigo 2020/21   6+ wks, ->to ENT->?vestib neuronitis with ongoing motion sensitivity, gradually resolving.(Dr. Arlana 10/18/19). MRI brain normal 09/2020.   Vitamin D  deficiency    Xerostomia    and xerophthalmia--per Dr. Arlana (he suggests SSA, SSB, ESR, rh factor, ANA, and antimicrosmal and antithyroglobulin ab's as of 02/17/16--all these labs were NORMAL.    Past Surgical History:  Procedure Laterality Date   ABDOMINAL HYSTERECTOMY     for prolapse with abdominal incision   BACK SURGERY  01/23/2015   Lumbar decompression with L2-L5 fusion (Dr. Malcolm)   CARDIAC CATHETERIZATION     no PCI   CHOLECYSTECTOMY     COLONOSCOPY  04/2006;07/16/19   Diverticulosis, o/w normal.  06/2019->2 adenomas, +Diverticulosis.  Recall 2023-2025.   DEXA  04/06/2018   T score -1.5 femur neck, -2.2 radius.  FRAX 10%/1.5%.  10/2021 T score -1.6 radius, -0.9 femur. rpt 2 yrs   ESOPHAGOGASTRODUODENOSCOPY  05/29/2009; 03/01/17;07/16/19   Esoph normal.  Gastritis (H pylori NEG) + gastric polyps (benign).  Duodenal biopsy NORMAL.  2018-mild chronic gastritis, H pylori NEG. 06/2019 tortuous esophagus w/out stenosis +chronic gastritis H pyl neg.   EYE SURGERY Left    lazy eye   HAND SURGERY Right    TRIGGER FINGER; index finger   HERNIA REPAIR     umbilical   KNEE ARTHROSCOPY     RIGHT KNEE X 5   LAMINOTOMY  2010   L-4, L-5 FUSION   NASAL SINUS SURGERY     polypectomy (remote past)   OOPHORECTOMY  2010   Laparoscopic BSO (path benign)   PATELLA FRACTURE SURGERY  06/2007   Dr. Ernie   POSTERIOR LAMINECTOMY / DECOMPRESSION LUMBAR SPINE  05/2008   Dr. Louis   TOTAL KNEE ARTHROPLASTY Right 12-07 and 4-07   Dr. Duwayne, revision 12-09 Dr. Jenna at [facility]    Outpatient Medications Prior to Visit   Medication Sig Dispense Refill   ALPRAZolam  (XANAX ) 1 MG tablet TAKE 1 TO 2 TABLETS BY MOUTH TWICE DAILY AS NEEDED 120 tablet 5   Cholecalciferol (VITAMIN D3) 5000 units CAPS Take 4 capsules by mouth daily.      diclofenac Sodium (VOLTAREN) 1 % GEL Apply 4 g topically 4 (four) times daily.     hydrocortisone  2.5 % cream Apply 1 application  topically as needed. RECTAL ITCHING     levothyroxine  (SYNTHROID ) 75 MCG tablet Take 1 tablet (75 mcg total) by mouth daily. 90 tablet 3   meclizine  (ANTIVERT ) 25 MG tablet TAKE 1 TABLET BY MOUTH THREE TIMES DAILY AS NEEDED FOR DIZZINESS 45 tablet 1   metoprolol  tartrate (  LOPRESSOR ) 25 MG tablet Take 1 tablet (25 mg total) by mouth 2 (two) times daily. 180 tablet 3   Multiple Vitamins-Iron (MULTIVITAMIN/IRON PO) Take 1 tablet by mouth daily.     naloxegol  oxalate (MOVANTIK ) 12.5 MG TABS tablet Take 1 tablet (12.5 mg total) by mouth daily. 30 tablet 3   omeprazole  (PRILOSEC) 40 MG capsule Take 1 capsule (40 mg total) by mouth daily. 90 capsule 3   Oxycodone  HCl 10 MG TABS 10 mg every 4 (four) hours as needed.     promethazine  (PHENERGAN ) 25 MG tablet TAKE 1 TABLET BY MOUTH EVERY 6 HOURS AS NEEDED FOR NAUSEA AND VOMITING 90 tablet 2   simvastatin  (ZOCOR ) 20 MG tablet Take 1 tablet (20 mg total) by mouth at bedtime. 90 tablet 3   traZODone (DESYREL) 50 MG tablet Take 25 mg by mouth at bedtime.     Azelastine  HCl (ASTEPRO ) 0.15 % SOLN 2 sprays each nostril every 12 hours (Patient not taking: Reported on 06/22/2024) 30 mL 11   calcium carbonate (OSCAL) 1500 (600 Ca) MG TABS tablet Take by mouth daily. (Patient not taking: Reported on 06/22/2024)     No facility-administered medications prior to visit.    Allergies  Allergen Reactions   Cymbalta  [Duloxetine  Hcl]     Facial edema   Iohexol      Code: HIVES, Desc: pt states her face and throat swells when administered IV contrast - KB, Onset Date: 87907990    Mepergan [Meperidine-Promethazine ]     Cardiac  arrest   Morphine Rash   Augmentin  [Amoxicillin -Pot Clavulanate] Nausea Only   Citalopram  Other (See Comments)    Jittery/heart racing    Hctz [Hydrochlorothiazide ] Other (See Comments)    Too much urinating, felt dried out, etc--NO ALLERGY   Lexapro [Escitalopram Oxalate] Other (See Comments)    Jittery,heart racing   Lyrica [Pregabalin] Swelling    Face, hands, fingers, ankles   Promethazine  Hcl    Valsartan     REACTION: pt states INTOL to Diovan    Review of Systems As per HPI  PE:    06/22/2024    1:13 PM 06/22/2024    1:02 PM 05/25/2024    1:14 PM  Vitals with BMI  Height  5' 7 5' 7  Weight  194 lbs 13 oz 191 lbs 3 oz  BMI  30.5 29.94  Systolic 149 146 877  Diastolic 83 78 79  Pulse  69 85     Physical Exam  Gen: Alert, very tired-appearing.  No distress. Patient is oriented to person, place, time, and situation. She is very anxious/worried.  Depressed affect. No further exam today.  LABS:  Last CBC Lab Results  Component Value Date   WBC 6.6 01/16/2024   HGB 13.4 01/16/2024   HCT 39.7 01/16/2024   MCV 85.2 01/16/2024   MCH 28.8 01/16/2024   RDW 12.8 01/16/2024   PLT 200 01/16/2024   Lab Results  Component Value Date   IRON 68 01/16/2024   TIBC 388 01/16/2024   FERRITIN 160 01/16/2024   Last metabolic panel Lab Results  Component Value Date   GLUCOSE 120 (H) 01/16/2024   NA 137 01/16/2024   K 4.3 01/16/2024   CL 101 01/16/2024   CO2 27 01/16/2024   BUN 32 (H) 01/16/2024   CREATININE 1.07 (H) 01/16/2024   GFRNONAA 53 (L) 01/16/2024   CALCIUM 9.5 01/16/2024   PROT 7.2 01/16/2024   ALBUMIN 4.3 01/16/2024   LABGLOB 3.1 01/16/2024  AGRATIO 1.2 01/16/2024   BILITOT 0.5 01/16/2024   ALKPHOS 81 01/16/2024   AST 13 (L) 01/16/2024   ALT 16 01/16/2024   ANIONGAP 9 01/16/2024   Lab Results  Component Value Date   CHOL 289 (H) 10/22/2021   HDL 32.60 (L) 10/22/2021   LDLCALC 123 (H) 04/21/2021   LDLDIRECT 207.0 10/22/2021   TRIG 310.0  (H) 10/22/2021   CHOLHDL 9 10/22/2021   Last thyroid  functions Lab Results  Component Value Date   TSH 1.46 05/25/2024   T3TOTAL 137 06/26/2020   THYROIDAB <1 02/24/2016   IMPRESSION AND PLAN:  #1 GAD with superimposed adjustment disorder with mixed anxious and depressed mood. Coping appropriately but understandably she is struggling a lot. Empathic listening today. No changes in treatment. She knows she can come back anytime she needs to.  Otherwise we will see her back in a month to recheck. Continue alprazolam  1 mg, 1-2 twice daily as needed. Controlled substance contract is up-to-date.  New prescription was not needed today.   #2 opioid-induced constipation. Movantik  is helping.  She will check to see which strength pill are in her samples at home.  If they are 12.5 mg then I advised her to take 2 of them daily, and if they are 25 mg then she will take 1 daily.  (In review of her chart it appears we have tried Linzess  and Amitiza  back in 2022 but is not clear whether she did not continue these due to cost or ineffectiveness.)   #3 acquired hypothyroidism.  Has been doing well on 75 mcg levothyroxine  daily. TSH 1.46 about 1 month ago.  An After Visit Summary was printed and given to the patient.  I personally spent a total of 33 minutes in the care of the patient today including preparing to see the patient, performing a medically appropriate exam/evaluation, counseling and educating, and documenting clinical information in the EHR.  FOLLOW UP: Return in about 4 weeks (around 07/20/2024) for routine chronic illness f/u.  Signed:  Gerlene Hockey, MD           06/22/2024

## 2024-07-01 ENCOUNTER — Other Ambulatory Visit: Payer: Self-pay

## 2024-07-01 ENCOUNTER — Encounter (HOSPITAL_COMMUNITY): Payer: Self-pay | Admitting: Emergency Medicine

## 2024-07-01 ENCOUNTER — Emergency Department (HOSPITAL_COMMUNITY)

## 2024-07-01 ENCOUNTER — Emergency Department (HOSPITAL_COMMUNITY)
Admission: EM | Admit: 2024-07-01 | Discharge: 2024-07-01 | Disposition: A | Discharge status: Home / Self Care | Attending: Emergency Medicine | Admitting: Emergency Medicine

## 2024-07-01 DIAGNOSIS — D472 Monoclonal gammopathy: Secondary | ICD-10-CM | POA: Insufficient documentation

## 2024-07-01 DIAGNOSIS — G4733 Obstructive sleep apnea (adult) (pediatric): Secondary | ICD-10-CM | POA: Diagnosis not present

## 2024-07-01 DIAGNOSIS — Z79899 Other long term (current) drug therapy: Secondary | ICD-10-CM | POA: Insufficient documentation

## 2024-07-01 DIAGNOSIS — M5030 Other cervical disc degeneration, unspecified cervical region: Secondary | ICD-10-CM | POA: Diagnosis not present

## 2024-07-01 DIAGNOSIS — Z96651 Presence of right artificial knee joint: Secondary | ICD-10-CM | POA: Diagnosis not present

## 2024-07-01 DIAGNOSIS — T40601A Poisoning by unspecified narcotics, accidental (unintentional), initial encounter: Secondary | ICD-10-CM | POA: Insufficient documentation

## 2024-07-01 DIAGNOSIS — R4182 Altered mental status, unspecified: Secondary | ICD-10-CM | POA: Insufficient documentation

## 2024-07-01 DIAGNOSIS — M51369 Other intervertebral disc degeneration, lumbar region without mention of lumbar back pain or lower extremity pain: Secondary | ICD-10-CM | POA: Diagnosis not present

## 2024-07-01 DIAGNOSIS — I700 Atherosclerosis of aorta: Secondary | ICD-10-CM | POA: Insufficient documentation

## 2024-07-01 DIAGNOSIS — M545 Low back pain, unspecified: Secondary | ICD-10-CM | POA: Diagnosis not present

## 2024-07-01 DIAGNOSIS — Z043 Encounter for examination and observation following other accident: Secondary | ICD-10-CM | POA: Diagnosis not present

## 2024-07-01 DIAGNOSIS — I10 Essential (primary) hypertension: Secondary | ICD-10-CM | POA: Insufficient documentation

## 2024-07-01 DIAGNOSIS — K5730 Diverticulosis of large intestine without perforation or abscess without bleeding: Secondary | ICD-10-CM | POA: Insufficient documentation

## 2024-07-01 DIAGNOSIS — R001 Bradycardia, unspecified: Secondary | ICD-10-CM | POA: Diagnosis not present

## 2024-07-01 DIAGNOSIS — F32A Depression, unspecified: Secondary | ICD-10-CM | POA: Insufficient documentation

## 2024-07-01 DIAGNOSIS — Z981 Arthrodesis status: Secondary | ICD-10-CM | POA: Diagnosis not present

## 2024-07-01 DIAGNOSIS — M4312 Spondylolisthesis, cervical region: Secondary | ICD-10-CM | POA: Diagnosis not present

## 2024-07-01 DIAGNOSIS — T50915A Adverse effect of multiple unspecified drugs, medicaments and biological substances, initial encounter: Secondary | ICD-10-CM | POA: Insufficient documentation

## 2024-07-01 DIAGNOSIS — S299XXA Unspecified injury of thorax, initial encounter: Secondary | ICD-10-CM | POA: Diagnosis not present

## 2024-07-01 DIAGNOSIS — M797 Fibromyalgia: Secondary | ICD-10-CM | POA: Diagnosis not present

## 2024-07-01 DIAGNOSIS — M47812 Spondylosis without myelopathy or radiculopathy, cervical region: Secondary | ICD-10-CM | POA: Diagnosis not present

## 2024-07-01 DIAGNOSIS — R0681 Apnea, not elsewhere classified: Secondary | ICD-10-CM | POA: Diagnosis not present

## 2024-07-01 DIAGNOSIS — S0990XA Unspecified injury of head, initial encounter: Secondary | ICD-10-CM | POA: Diagnosis not present

## 2024-07-01 DIAGNOSIS — R42 Dizziness and giddiness: Secondary | ICD-10-CM | POA: Diagnosis present

## 2024-07-01 DIAGNOSIS — M51379 Other intervertebral disc degeneration, lumbosacral region without mention of lumbar back pain or lower extremity pain: Secondary | ICD-10-CM | POA: Diagnosis not present

## 2024-07-01 DIAGNOSIS — R509 Fever, unspecified: Secondary | ICD-10-CM | POA: Diagnosis not present

## 2024-07-01 DIAGNOSIS — W19XXXA Unspecified fall, initial encounter: Secondary | ICD-10-CM | POA: Diagnosis not present

## 2024-07-01 DIAGNOSIS — S3993XA Unspecified injury of pelvis, initial encounter: Secondary | ICD-10-CM | POA: Diagnosis not present

## 2024-07-01 DIAGNOSIS — K219 Gastro-esophageal reflux disease without esophagitis: Secondary | ICD-10-CM | POA: Diagnosis not present

## 2024-07-01 DIAGNOSIS — S3991XA Unspecified injury of abdomen, initial encounter: Secondary | ICD-10-CM | POA: Diagnosis not present

## 2024-07-01 DIAGNOSIS — S199XXA Unspecified injury of neck, initial encounter: Secondary | ICD-10-CM | POA: Diagnosis not present

## 2024-07-01 LAB — CBC WITH DIFFERENTIAL/PLATELET
Abs Immature Granulocytes: 0.03 K/uL (ref 0.00–0.07)
Basophils Absolute: 0 K/uL (ref 0.0–0.1)
Basophils Relative: 1 %
Eosinophils Absolute: 0.2 K/uL (ref 0.0–0.5)
Eosinophils Relative: 3 %
HCT: 43.1 % (ref 36.0–46.0)
Hemoglobin: 14.1 g/dL (ref 12.0–15.0)
Immature Granulocytes: 0 %
Lymphocytes Relative: 29 %
Lymphs Abs: 2.3 K/uL (ref 0.7–4.0)
MCH: 27.7 pg (ref 26.0–34.0)
MCHC: 32.7 g/dL (ref 30.0–36.0)
MCV: 84.7 fL (ref 80.0–100.0)
Monocytes Absolute: 0.3 K/uL (ref 0.1–1.0)
Monocytes Relative: 4 %
Neutro Abs: 5 K/uL (ref 1.7–7.7)
Neutrophils Relative %: 63 %
Platelets: 220 K/uL (ref 150–400)
RBC: 5.09 MIL/uL (ref 3.87–5.11)
RDW: 12.8 % (ref 11.5–15.5)
WBC: 7.9 K/uL (ref 4.0–10.5)
nRBC: 0 % (ref 0.0–0.2)

## 2024-07-01 LAB — BLOOD GAS, VENOUS
Acid-Base Excess: 2.4 mmol/L — ABNORMAL HIGH (ref 0.0–2.0)
Bicarbonate: 27.2 mmol/L (ref 20.0–28.0)
O2 Saturation: 81.2 %
Patient temperature: 37
pCO2, Ven: 42 mmHg — ABNORMAL LOW (ref 44–60)
pH, Ven: 7.42 (ref 7.25–7.43)
pO2, Ven: 50 mmHg — ABNORMAL HIGH (ref 32–45)

## 2024-07-01 LAB — HEPATIC FUNCTION PANEL
ALT: 17 U/L (ref 0–44)
AST: 18 U/L (ref 15–41)
Albumin: 4.3 g/dL (ref 3.5–5.0)
Alkaline Phosphatase: 98 U/L (ref 38–126)
Bilirubin, Direct: 0.2 mg/dL (ref 0.0–0.2)
Indirect Bilirubin: 0.2 mg/dL — ABNORMAL LOW (ref 0.3–0.9)
Total Bilirubin: 0.4 mg/dL (ref 0.0–1.2)
Total Protein: 7.2 g/dL (ref 6.5–8.1)

## 2024-07-01 LAB — BASIC METABOLIC PANEL WITH GFR
Anion gap: 12 (ref 5–15)
BUN: 28 mg/dL — ABNORMAL HIGH (ref 8–23)
CO2: 25 mmol/L (ref 22–32)
Calcium: 9.7 mg/dL (ref 8.9–10.3)
Chloride: 106 mmol/L (ref 98–111)
Creatinine, Ser: 0.79 mg/dL (ref 0.44–1.00)
GFR, Estimated: >60 mL/min (ref >60)
Glucose, Bld: 118 mg/dL — ABNORMAL HIGH (ref 70–99)
Potassium: 4 mmol/L (ref 3.5–5.1)
Sodium: 142 mmol/L (ref 135–145)

## 2024-07-01 LAB — URINALYSIS, ROUTINE W REFLEX MICROSCOPIC
Bilirubin Urine: NEGATIVE
Glucose, UA: NEGATIVE mg/dL
Hgb urine dipstick: NEGATIVE
Ketones, ur: NEGATIVE mg/dL
Nitrite: NEGATIVE
Protein, ur: NEGATIVE mg/dL
Specific Gravity, Urine: 1.009 (ref 1.005–1.030)
pH: 7 (ref 5.0–8.0)

## 2024-07-01 LAB — MAGNESIUM: Magnesium: 2.6 mg/dL — ABNORMAL HIGH (ref 1.7–2.4)

## 2024-07-01 LAB — TSH: TSH: 1.52 u[IU]/mL (ref 0.350–4.500)

## 2024-07-01 LAB — AMMONIA: Ammonia: 22 umol/L (ref 9–35)

## 2024-07-01 LAB — ETHANOL: Alcohol, Ethyl (B): <15 mg/dL (ref <15)

## 2024-07-01 LAB — CBG MONITORING, ED: Glucose-Capillary: 126 mg/dL — ABNORMAL HIGH (ref 70–99)

## 2024-07-01 MED ORDER — SODIUM CHLORIDE 0.9 % IV BOLUS
1000.0000 mL | Freq: Once | INTRAVENOUS | Status: AC
  Administered 2024-07-01: 1000 mL via INTRAVENOUS

## 2024-07-01 MED ORDER — LACTATED RINGERS IV BOLUS: 1000.0000 mL | Freq: Once | INTRAVENOUS | Status: DC

## 2024-07-01 MED ORDER — HALOPERIDOL LACTATE 5 MG/ML IJ SOLN
2.0000 mg | Freq: Once | INTRAMUSCULAR | Status: AC
  Administered 2024-07-01: 2 mg via INTRAVENOUS
  Filled 2024-07-01: qty 1

## 2024-07-01 NOTE — Discharge Instructions (Addendum)
 Your test results today were reassuring.  Avoid combining sleep aids, Xanax  and/or narcotic pain medications.  Follow-up with your PCP as soon as possible.  Return to the emergency department for any new or worsening symptoms of concern.

## 2024-07-01 NOTE — ED Provider Notes (Signed)
 [facility] AT [facility] Provider Note   CSN: [id number] Arrival date & time: 07/01/24  2002     Patient presents with: Drug Overdose and Urinary Frequency   [name] is a 78 y.o. female.    Drug Overdose  Urinary Frequency  Patient presents for altered mental status.  Medical history includes anemia, MGUS, OSA, GERD, fibromyalgia, depression, HTN.  She has had worsening of chronic right knee pain.  Yesterday she had a fall.  EMS was called to her home.  They evaluated her on scene.  Family reports that she was somnolent at the time.  She did not get transported.  Today, her family member noted that she was dizzy and off balance.  EMS was called to her home.  EMS suspected patient possibly taking her husband's narcotic medications.  Per patient's family member, patient's husband is at home, terminally ill.  Patient endorsing burning pain with urination.  EMS did note fever of 102 degrees and did give Tylenol  prior to arrival.  They gave Narcan due to respiratory depression.  On arrival, patient is stating that she hurts all over.  She does not specify any specific locations.  History is limited by her emotional state, as she is currently crying and stating that she wants monitoring equipment off of her.  History is provided mostly by her family member.     Prior to Admission medications   Medication Sig Start Date End Date Taking? Authorizing Provider  ALPRAZolam  (XANAX ) 1 MG tablet TAKE 1 TO 2 TABLETS BY MOUTH TWICE DAILY AS NEEDED 05/25/24   McGowen, Aleene DEL, MD  Azelastine  HCl (ASTEPRO ) 0.15 % SOLN 2 sprays each nostril every 12 hours Patient not taking: Reported on 06/22/2024 07/19/14   McGowen, Aleene DEL, MD  calcium carbonate (OSCAL) 1500 (600 Ca) MG TABS tablet Take by mouth daily. Patient not taking: Reported on 06/22/2024    [provider]  Cholecalciferol (VITAMIN D3) 5000 units CAPS Take 4 capsules by mouth daily.     [provider]  diclofenac Sodium (VOLTAREN) 1 % GEL Apply 4 g topically 4 (four) times daily. 09/03/20   [provider]  hydrocortisone  2.5 % cream Apply 1 application  topically as needed. RECTAL ITCHING 09/08/12   [provider]  levothyroxine  (SYNTHROID ) 75 MCG tablet Take 1 tablet (75 mcg total) by mouth daily. 04/04/24   McGowen, Aleene DEL, MD  meclizine  (ANTIVERT ) 25 MG tablet TAKE 1 TABLET BY MOUTH THREE TIMES DAILY AS NEEDED FOR DIZZINESS 02/08/24   McGowen, Aleene DEL, MD  metoprolol  tartrate (LOPRESSOR ) 25 MG tablet Take 1 tablet (25 mg total) by mouth 2 (two) times daily. 04/04/24   McGowen, Aleene DEL, MD  Multiple Vitamins-Iron (MULTIVITAMIN/IRON PO) Take 1 tablet by mouth daily.    [provider]  naloxegol  oxalate (MOVANTIK ) 12.5 MG TABS tablet Take 1 tablet (12.5 mg total) by mouth daily. 04/04/24   McGowen, Aleene DEL, MD  omeprazole  (PRILOSEC) 40 MG capsule Take 1 capsule (40 mg total) by mouth daily. 11/04/23   McGowen, Aleene DEL, MD  Oxycodone  HCl 10 MG TABS 10 mg every 4 (four) hours as needed. 12/01/15   [provider]  promethazine  (PHENERGAN ) 25 MG tablet TAKE 1 TABLET BY MOUTH EVERY 6 HOURS AS NEEDED FOR NAUSEA AND VOMITING 02/05/22   McGowen, Aleene DEL, MD  simvastatin  (ZOCOR ) 20 MG tablet Take 1 tablet (20 mg total) by mouth at bedtime. 11/04/23   McGowen, Aleene DEL,  MD  traZODone (DESYREL) 50 MG tablet Take 25 mg by mouth at bedtime. 10/06/23   [provider]    Allergies: Cymbalta  [duloxetine  hcl], Iohexol, Mepergan [meperidine-promethazine ], Morphine, Augmentin  [amoxicillin -pot clavulanate], Citalopram , Hctz [hydrochlorothiazide ], Lexapro [escitalopram oxalate], Lyrica [pregabalin], Promethazine  hcl, and Valsartan    Review of Systems  Unable to perform ROS: Mental status change  Genitourinary:  Positive for dysuria and frequency.  Musculoskeletal:  Positive for arthralgias, back pain and myalgias.    Updated Vital Signs BP 129/86    Pulse 69   Temp 98 F (36.7 C) (Axillary)   Resp 16   SpO2 100%   Physical Exam Vitals and nursing note reviewed.  Constitutional:      General: She is not in acute distress.    Appearance: Normal appearance. She is well-developed. She is not ill-appearing, toxic-appearing or diaphoretic.  HENT:     Head: Normocephalic and atraumatic.     Right Ear: External ear normal.     Left Ear: External ear normal.     Nose: Nose normal.     Mouth/Throat:     Mouth: Mucous membranes are moist.  Eyes:     Extraocular Movements: Extraocular movements intact.     Conjunctiva/sclera: Conjunctivae normal.  Cardiovascular:     Rate and Rhythm: Normal rate and regular rhythm.  Pulmonary:     Effort: Pulmonary effort is normal. No respiratory distress.  Abdominal:     General: There is no distension.     Palpations: Abdomen is soft.     Tenderness: There is no abdominal tenderness.  Musculoskeletal:        General: No swelling or deformity.     Cervical back: Normal range of motion and neck supple.  Skin:    General: Skin is warm and dry.     Coloration: Skin is not jaundiced or pale.  Neurological:     General: No focal deficit present.     Mental Status: She is alert. She is disoriented.  Psychiatric:        Mood and Affect: Mood is anxious. Affect is tearful.        Behavior: Behavior is uncooperative and agitated.     (all labs ordered are listed, but only abnormal results are displayed) Labs Reviewed  URINALYSIS, ROUTINE W REFLEX MICROSCOPIC - Abnormal; Notable for the following components:      Result Value   Color, Urine STRAW (*)    Leukocytes,Ua TRACE (*)    Bacteria, UA RARE (*)    All other components within normal limits  BASIC METABOLIC PANEL WITH GFR - Abnormal; Notable for the following components:   Glucose, Bld 118 (*)    BUN 28 (*)    All other components within normal limits  HEPATIC FUNCTION PANEL - Abnormal; Notable for the following components:   Indirect  Bilirubin 0.2 (*)    All other components within normal limits  BLOOD GAS, VENOUS - Abnormal; Notable for the following components:   pCO2, Ven 42 (*)    pO2, Ven 50 (*)    Acid-Base Excess 2.4 (*)    All other components within normal limits  MAGNESIUM - Abnormal; Notable for the following components:   Magnesium 2.6 (*)    All other components within normal limits  CBG MONITORING, ED - Abnormal; Notable for the following components:   Glucose-Capillary 126 (*)    All other components within normal limits  CBC WITH DIFFERENTIAL/PLATELET  AMMONIA  ETHANOL  TSH  URINE DRUG  SCREEN    EKG: EKG Interpretation Date/Time:  Sunday July 01 2024 20:45:03 EDT Ventricular Rate:  67 PR Interval:  179 QRS Duration:  96 QT Interval:  418 QTC Calculation: 442 R Axis:   51  Text Interpretation: Sinus rhythm Confirmed by Melvenia Motto [phone]) on 07/01/2024 8:54:06 PM  Radiology: CT Cervical Spine Wo Contrast Result Date: 07/01/2024 CLINICAL DATA:  Neck trauma (Age >= 65y).  Fall. EXAM: CT CERVICAL SPINE WITHOUT CONTRAST TECHNIQUE: Multidetector CT imaging of the cervical spine was performed without intravenous contrast. Multiplanar CT image reconstructions were also generated. RADIATION DOSE REDUCTION: This exam was performed according to the departmental dose-optimization program which includes automated exposure control, adjustment of the mA and/or kV according to patient size and/or use of iterative reconstruction technique. COMPARISON:  04/10/2024 FINDINGS: Alignment: 4 mm degenerative anterolisthesis at C4-5, stable. Skull base and vertebrae: No acute fracture. No primary bone lesion or focal pathologic process. Soft tissues and spinal canal: No prevertebral fluid or swelling. No visible canal hematoma. Disc levels:  Multilevel spondylosis and advanced facet arthropathy. Upper chest: No acute findings Other: None IMPRESSION: Multilevel degenerative disc and facet disease. No acute bony  abnormality. Electronically Signed   By: Franky Crease M.D.   On: 07/01/2024 22:36   CT CHEST ABDOMEN PELVIS WO CONTRAST Result Date: 07/01/2024 CLINICAL DATA:  Polytrauma, blunt.  Fall. EXAM: CT CHEST, ABDOMEN AND PELVIS WITHOUT CONTRAST TECHNIQUE: Multidetector CT imaging of the chest, abdomen and pelvis was performed following the standard protocol without IV contrast. RADIATION DOSE REDUCTION: This exam was performed according to the departmental dose-optimization program which includes automated exposure control, adjustment of the mA and/or kV according to patient size and/or use of iterative reconstruction technique. COMPARISON:  None Available. FINDINGS: CT CHEST FINDINGS Cardiovascular: Heart is normal size. Aorta is normal caliber. Aortic atherosclerosis. Mediastinum/Nodes: No mediastinal, hilar, or axillary adenopathy. Trachea and esophagus are unremarkable. Thyroid  unremarkable. Lungs/Pleura: Ground-glass opacities dependently in the lower lobes compatible scarring, unchanged. No effusions or pneumothorax. Musculoskeletal: Chest wall soft tissues are unremarkable. No acute bony abnormality. CT ABDOMEN PELVIS FINDINGS Hepatobiliary: No focal liver abnormality is seen. Status post cholecystectomy. No biliary dilatation. Pancreas: No focal abnormality or ductal dilatation. Spleen: No splenic injury or perisplenic hematoma. Adrenals/Urinary Tract: No adrenal hemorrhage or renal injury identified. Bladder is unremarkable. Stomach/Bowel: Sigmoid diverticulosis. No active diverticulitis. Stomach and small bowel decompressed, unremarkable. Vascular/Lymphatic: Aortic atherosclerosis. No evidence of aneurysm or adenopathy. Reproductive: Prior hysterectomy.  No adnexal masses. Other: No free fluid or free air. Musculoskeletal: No acute bony abnormality. Postoperative and degenerative changes in the lumbar spine. IMPRESSION: No acute findings or significant traumatic injury in the chest, abdomen or pelvis. Aortic  atherosclerosis. Sigmoid diverticulosis. Electronically Signed   By: Franky Crease M.D.   On: 07/01/2024 22:33   CT T-SPINE NO CHARGE Result Date: 07/01/2024 CLINICAL DATA:  Fall EXAM: CT THORACIC SPINE WITHOUT CONTRAST TECHNIQUE: Multidetector CT images of the thoracic were obtained using the standard protocol without intravenous contrast. RADIATION DOSE REDUCTION: This exam was performed according to the departmental dose-optimization program which includes automated exposure control, adjustment of the mA and/or kV according to patient size and/or use of iterative reconstruction technique. COMPARISON:  None Available. FINDINGS: Alignment: Normal Vertebrae: No acute fracture or focal pathologic process. Paraspinal and other soft tissues: Normal Disc levels: Negative IMPRESSION: No acute bony abnormality. Electronically Signed   By: Franky Crease M.D.   On: 07/01/2024 22:27   CT L-SPINE NO CHARGE Result Date: 07/01/2024 CLINICAL  DATA:  Fall EXAM: CT LUMBAR SPINE WITHOUT CONTRAST TECHNIQUE: Multidetector CT imaging of the lumbar spine was performed without intravenous contrast administration. Multiplanar CT image reconstructions were also generated. RADIATION DOSE REDUCTION: This exam was performed according to the departmental dose-optimization program which includes automated exposure control, adjustment of the mA and/or kV according to patient size and/or use of iterative reconstruction technique. COMPARISON:  Lumbar spine 04/10/2024 FINDINGS: Segmentation: 5 lumbar type vertebrae. Alignment: Normal Vertebrae: Postoperative changes with posterior fusion changes from L2-L5. No acute fracture. Paraspinal and other soft tissues: Negative Disc levels: Advanced degenerative disc disease at L1-2 above the fusion level. Mild degenerative disc disease at L5-S1. Advanced degenerative facet disease at L5-S1. IMPRESSION: Degenerative and postoperative changes.  No acute bony abnormality. Electronically Signed   By:  Franky Crease M.D.   On: 07/01/2024 22:26   CT Head Wo Contrast Result Date: 07/01/2024 CLINICAL DATA:  Head trauma, minor (Age >= 65y). EXAM: CT HEAD WITHOUT CONTRAST TECHNIQUE: Contiguous axial images were obtained from the base of the skull through the vertex without intravenous contrast. RADIATION DOSE REDUCTION: This exam was performed according to the departmental dose-optimization program which includes automated exposure control, adjustment of the mA and/or kV according to patient size and/or use of iterative reconstruction technique. COMPARISON:  04/10/2024 FINDINGS: Brain: Chronic bilateral basal ganglia lacunar infarcts. There is atrophy and chronic small vessel disease changes. No acute intracranial abnormality. Specifically, no hemorrhage, hydrocephalus, mass lesion, acute infarction, or significant intracranial injury. Vascular: No hyperdense vessel or unexpected calcification. Skull: No acute calvarial abnormality. Sinuses/Orbits: No acute findings Other: None IMPRESSION: Atrophy, chronic microvascular disease. No acute intracranial abnormality. Electronically Signed   By: Franky Crease M.D.   On: 07/01/2024 22:23   DG Knee 2 Views Right Result Date: 07/01/2024 CLINICAL DATA:  Recent fall with right knee pain cough initial encounter EXAM: RIGHT KNEE - 2 VIEW COMPARISON:  None Available. FINDINGS: Right knee prosthesis is noted. No acute fracture or dislocation is noted. No soft tissue changes are seen. IMPRESSION: No acute abnormality noted. Electronically Signed   By: Oneil Devonshire M.D.   On: 07/01/2024 21:21     Procedures   Medications Ordered in the ED  haloperidol lactate (HALDOL) injection 2 mg (2 mg Intravenous Given 07/01/24 2109)  sodium chloride  0.9 % bolus 1,000 mL (0 mLs Intravenous Stopped 07/01/24 2334)                                    Medical Decision Making Amount and/or Complexity of Data Reviewed Labs: ordered. Radiology: ordered.  Risk Prescription drug  management.   This patient presents to the ED for concern of multiple complaints, this involves an extensive number of treatment options, and is a complaint that carries with it a high risk of complications and morbidity.  The differential diagnosis includes accidental overdose, intentional overdose, infection, deconditioning, metabolic derangements, acute injury   Co morbidities / Chronic conditions that complicate the patient evaluation  anemia, MGUS, OSA, GERD, fibromyalgia, depression, HTN   Additional history obtained:  Additional history obtained from EMR External records from outside source obtained and reviewed including patient's sister   Lab Tests:  I Ordered, and personally interpreted labs.  The pertinent results include: Normal hemoglobin, no leukocytosis, normal kidney function, hypomagnesemia with otherwise normal electrolytes   Imaging Studies ordered:  I ordered imaging studies including x-ray of right knee, CT of head, cervical spine,  chest, abdomen, pelvis, T-spine, L-spine I independently visualized and interpreted imaging which showed no acute findings I agree with the radiologist interpretation   Cardiac Monitoring: / EKG:  The patient was maintained on a cardiac monitor.  I personally viewed and interpreted the cardiac monitored which showed an underlying rhythm of: Sinus rhythm   Problem List / ED Course / Critical interventions / Medication management  Patient presenting from home due to family members concerns about unsteadiness on her feet today.  EMS noted fever and respiratory depression.  She was given Narcan prior to arrival.  On arrival, patient is awake and alert.  She is emotional and agitated.  Her sister is present at bedside.  Sister reports that the patient has a husband at home who is undergoing palliative care for terminal cancer.  She has been recently overwhelmed.  Patient is endorsing a burning sensation in her genital area.  This area  was inspected with nurse chaperone present.  There are no abnormal skin findings.  Dose of Haldol was ordered for her agitation.  IV fluids were ordered for hydration.  Workup was initiated.  Patient's lab work was unremarkable.  Urinalysis is not consistent with UTI.  Patient underwent extensive imaging studies given her recent falls.  No acute findings were identified.  On reassessment, patient is more calm.  Her sister remains at bedside.  Patient reports that she took an over-the-counter sleep aid as well as 1 oxycodone  which she is prescribed from her pain management clinic.  She denies any excessive medication use.  Per chart review, she is prescribed Xanax  as well.  She does seem to have some slurred speech, however, sister reports that that is her normal speech.  I suspect polypharmacy causing her agitation and unsteadiness at home.  Patient was offered overnight stay in the ED for social work and physical therapy evaluation in the morning.  Patient stating that she wants to go home to be with her husband.  Her sister feels like she is at her mental baseline.  On trial of ambulation, patient was able to ambulate a few steps without assistance consistent with her baseline mobility.  She was discharged in stable condition. I ordered medication including IV fluids for hydration, Haldol for agitation Reevaluation of the patient after these medicines showed that the patient improved I have reviewed the patients home medicines and have made adjustments as needed  Social Determinants of Health:  Lives at home with husband.  Has home health and family support.     Final diagnoses:  Polypharmacy    ED Discharge Orders     None          Melvenia Motto, MD 07/01/24 2336

## 2024-07-01 NOTE — ED Triage Notes (Signed)
 Pt arrives w/ [facility] after possibly taking her husbands narcotic medications. Hx of same. Also c/o urinary frequency and burning. Temp of 102.0 w/ EMS.650 tylenol  given en route. Also given 0.4 narcan due to resp depression. Pt A&O x 4 w/ confusion.

## 2024-07-06 ENCOUNTER — Ambulatory Visit: Payer: Self-pay

## 2024-07-06 NOTE — Telephone Encounter (Signed)
 noted

## 2024-07-06 NOTE — Telephone Encounter (Signed)
 Patient states the home aide that helps her husband called out today due to illness. Patient reports she had already started to not feel well and found her temperature of 100.1. No cough, congestion, CP or SOB. Reports fatigue currently. Patient is recommended to watch her temperature and use OTC fever reducing medication.   FYI Only or Action Required?: FYI only for provider.  Patient was last seen in primary care on 06/22/2024 by McGowen, Aleene DEL, MD.  Called Nurse Triage reporting Fever.  Symptoms began today.  Interventions attempted: OTC medications: Tylenol  and Rest, hydration, or home remedies.  Symptoms are: unchanged.  Triage Disposition: Home Care  Patient/caregiver understands and will follow disposition?: Yes  Copied from CRM #[id number]. Topic: Clinical - Red Word Triage >> Jul 06, 2024 11:09 AM Nessti S wrote: [facility] that prompted transfer to Nurse Triage: fever Reason for Disposition  [1] Fever AND [2] no signs of serious infection or localizing symptoms (all other triage questions negative)  Answer Assessment - Initial Assessment Questions 1. TEMPERATURE: What is the most recent temperature?  How was it measured?      100.1 2. ONSET: When did the fever start?      This morning 3. CHILLS: Do you have chills? If yes: How bad are they?  (e.g., none, mild, moderate, severe)     No chills 4. OTHER SYMPTOMS: Do you have any other symptoms besides the fever?  (e.g., abdomen pain, cough, diarrhea, earache, headache, sore throat, urination pain)     fatigue 5. CAUSE: If there are no symptoms, ask: What do you think is causing the fever?      Patient reports the home aide called in today sick.  6. CONTACTS: Does anyone else in the family have an infection?     Husband has terminal cancer 7. TREATMENT: What have you done so far to treat this fever? (e.g., OTC fever medicines)     Took 2 Tylenol  8. IMMUNOCOMPROMISE: Do you have any of the following:  diabetes, HIV positive, splenectomy, cancer chemotherapy, chronic steroid treatment, transplant patient, etc.?     no 10. TRAVEL: Have you traveled out of the country in the last month? (e.g., travel history, exposures)       no  Protocols used: [facility]

## 2024-07-10 ENCOUNTER — Telehealth: Payer: Self-pay

## 2024-07-10 ENCOUNTER — Encounter: Payer: Self-pay | Admitting: Family Medicine

## 2024-07-10 MED ORDER — NIRMATRELVIR/RITONAVIR (PAXLOVID) TABLET (RENAL DOSING): 2.0000 | ORAL_TABLET | Freq: Two times a day (BID) | ORAL | 0 refills | Status: DC

## 2024-07-10 NOTE — Telephone Encounter (Signed)
 Paxlovid sent in. Over-the-counter ibuprofen  600 mg per dose, take every 8 hours as needed for sore throat. Also can try over-the-counter Chloraseptic spray for the sore throat.  Salt water gargle is also another option.  Stop simvastatin  while taking Paxlovid.  Please put her on the schedule on Thursday.

## 2024-07-10 NOTE — Addendum Note (Signed)
 Addended by: CANDISE ALEENE DEL on: 07/10/2024 02:52 PM   Modules accepted: Orders

## 2024-07-10 NOTE — Telephone Encounter (Signed)
 Provider recommendations verbally discussed with patient.

## 2024-07-10 NOTE — Telephone Encounter (Signed)
 Pt advised of provider recommendations verbally and thru MyChart.  Paxlovid sent in. Over-the-counter ibuprofen  600 mg per dose, take every 8 hours as needed for sore throat. Also can try over-the-counter Chloraseptic spray for the sore throat.  Salt water gargle is also another option.   Stop simvastatin  while taking Paxlovid.

## 2024-07-10 NOTE — Telephone Encounter (Signed)
 Source  [name] (Patient)   Subject  [name] (Patient)   Topic  Clinical - Medical Advice    Communication  Reason for CRM: Patient husband was diagnosed with covid, and she feels she has it too. She is requesting something for a sore throat and is requesting to speak  to someone in office. Declined to speak with me or nurse triage, patient also declined appointment offer and stated she only wants medication. Patient can be reached at [phone].        Patient is specifically [name] to give patient call back.    Pt has sore throat, no difficulty with swallowing,fatigue,prior HA; has been taking Tylenol . Symptoms started yesterday. Denies having fever, body aches or chills. Husband discharged yesterday. She will be taking at home covid test today.  Please further advise. Next available opening Thurs 11:20 or Friday.

## 2024-07-11 MED ORDER — MOLNUPIRAVIR EUA 200MG CAPSULE: 4.0000 | ORAL_CAPSULE | Freq: Two times a day (BID) | ORAL | 0 refills | Status: DC

## 2024-07-11 NOTE — Addendum Note (Signed)
 Addended by: CANDISE ALEENE DEL on: 07/11/2024 11:30 PM   Modules accepted: Orders

## 2024-07-11 NOTE — Telephone Encounter (Signed)
 OK, I just sent in molnupiravir but it usually costs even more than paxlovid.

## 2024-07-12 ENCOUNTER — Other Ambulatory Visit: Payer: Self-pay

## 2024-07-12 ENCOUNTER — Other Ambulatory Visit: Payer: Self-pay | Admitting: Family Medicine

## 2024-07-12 ENCOUNTER — Ambulatory Visit (INDEPENDENT_AMBULATORY_CARE_PROVIDER_SITE_OTHER): Admitting: Family Medicine

## 2024-07-12 ENCOUNTER — Encounter: Payer: Self-pay | Admitting: Family Medicine

## 2024-07-12 VITALS — BP 142/79 | HR 71 | Temp 96.5°F | Ht 67.0 in | Wt 195.0 lb

## 2024-07-12 DIAGNOSIS — J988 Other specified respiratory disorders: Secondary | ICD-10-CM | POA: Diagnosis not present

## 2024-07-12 DIAGNOSIS — R051 Acute cough: Secondary | ICD-10-CM

## 2024-07-12 DIAGNOSIS — U071 COVID-19: Secondary | ICD-10-CM

## 2024-07-12 NOTE — Progress Notes (Signed)
 OFFICE VISIT  07/12/2024  CC:  Chief Complaint  Patient presents with   Covid Positive    Current symptoms: throat discomfort, fatigue, headache; denies body aches or chills, n/v/d. Started taking Paxlovid yesterday    Patient is a 78 y.o. female who presents for respiratory illness.  INTERIM HX: A few days ago she started to get sore throat, fatigue, rattly cough, and headache.  No fever or chills.  No shortness of breath, wheezing, or chest pain. Her husband has just been in the hospital for COVID infection. Her home COVID test was equivocal. I sent in Paxlovid and she started this medication yesterday. She is alternating Tylenol  and ibuprofen .   Past Medical History:  Diagnosis Date   Allergic rhinitis    Anxiety    Chronic gastritis    dx'd by EGD 2018 (erosive, likely NSAID-induced). 06/2019 +gastritis.   Chronic pain syndrome    right knee osteo, hx of TKA in R knee.  Pt was told by [name] that no further surgical options are available for her; also, chronic lower back pain.--[name].   Chronic renal insufficiency, stage II (mild) 2015   vs acute fluctuations depending on hydration?---GFR from 60s up to 80s.   Diverticulosis of colon    with hx of 'itis   DJD (degenerative joint disease)    L spine and knees.  R knee inject 03/15/18, L knee inject 12/26/18 (by Dr. Gaspar).   FATIGUE / MALAISE 07/01/2009   Chronic   GERD (gastroesophageal reflux disease)    History of colonic polyps    HTN (hypertension)    Hypercholesterolemia    recommended restart of simvastatin  07/2018   Hypothyroid 07/05/2012   IBS (irritable bowel syndrome)    IC (interstitial cystitis)    [name] at [facility]   Iron deficiency anemia, unspecified 06/27/2013   Has required IV iron on multiple occasions (Dr. Timmy).  Hemoccults neg 12/2016. Most recent hem f/u labs normal 09/2020.   Lumbar back pain    Spinal stenosis, lumbar region with neurogenic claudication L5-S1  sx's--ESI's no help.  CT L spine stable post-surg appearance as of 12/2019   Major depression, recurrent    vs dysthymia,chronic   Memory impairment 10/03/2012   Memory impairment 10/03/2012 >referral to neuro     Monoclonal gammopathy of undetermined significance    IgM kappa MGUS (Dr. Timmy) routine f/u has shown stability of this problem (most recent f/u 09/2020)   OSA (obstructive sleep apnea)    Latest sleep study 01/2017.  Some adjustments to CPAP being made+ dental referral for consideration of oral appliance --per pulm MD.   Osteopenia 04/2014; 03/2018   2015: T score -2.0 FRAX 11%/1.8%.  2019 FRAX 10%/1.5%.  10/2021 T score -1.6 radius, -0.9 femur   Palpitations    Psoriasis    Trochanteric bursitis of both hips    + injections by ortho in the past   Vertigo 2020/21   6+ wks, ->to ENT->?vestib neuronitis with ongoing motion sensitivity, gradually resolving.(Dr. Arlana 10/18/19). MRI brain normal 09/2020.   Vitamin D  deficiency    Xerostomia    and xerophthalmia--per Dr. Arlana (he suggests SSA, SSB, ESR, rh factor, ANA, and antimicrosmal and antithyroglobulin ab's as of 02/17/16--all these labs were NORMAL.    Past Surgical History:  Procedure Laterality Date   ABDOMINAL HYSTERECTOMY     for prolapse with abdominal incision   BACK SURGERY  01/23/2015   Lumbar decompression with L2-L5 fusion (Dr. Malcolm)  CARDIAC CATHETERIZATION     no PCI   CHOLECYSTECTOMY     COLONOSCOPY  04/2006;07/16/19   Diverticulosis, o/w normal.  06/2019->2 adenomas, +Diverticulosis.  Recall 2023-2025.   DEXA  04/06/2018   T score -1.5 femur neck, -2.2 radius.  FRAX 10%/1.5%.  10/2021 T score -1.6 radius, -0.9 femur. rpt 2 yrs   ESOPHAGOGASTRODUODENOSCOPY  05/29/2009; 03/01/17;07/16/19   Esoph normal.  Gastritis (H pylori NEG) + gastric polyps (benign).  Duodenal biopsy NORMAL.  2018-mild chronic gastritis, H pylori NEG. 06/2019 tortuous esophagus w/out stenosis +chronic gastritis H pyl neg.   EYE SURGERY  Left    lazy eye   HAND SURGERY Right    TRIGGER FINGER; index finger   HERNIA REPAIR     umbilical   KNEE ARTHROSCOPY     RIGHT KNEE X 5   LAMINOTOMY  2010   L-4, L-5 FUSION   NASAL SINUS SURGERY     polypectomy (remote past)   OOPHORECTOMY  2010   Laparoscopic BSO (path benign)   PATELLA FRACTURE SURGERY  06/2007   Dr. Ernie   POSTERIOR LAMINECTOMY / DECOMPRESSION LUMBAR SPINE  05/2008   Dr. Louis   TOTAL KNEE ARTHROPLASTY Right 12-07 and 4-07   Dr. Duwayne, revision 12-09 Dr. Jenna at [facility]    Outpatient Medications Prior to Visit  Medication Sig Dispense Refill   ALPRAZolam  (XANAX ) 1 MG tablet TAKE 1 TO 2 TABLETS BY MOUTH TWICE DAILY AS NEEDED 120 tablet 5   Cholecalciferol (VITAMIN D3) 5000 units CAPS Take 4 capsules by mouth daily.      diclofenac Sodium (VOLTAREN) 1 % GEL Apply 4 g topically 4 (four) times daily.     hydrocortisone  2.5 % cream Apply 1 application  topically as needed. RECTAL ITCHING     levothyroxine  (SYNTHROID ) 75 MCG tablet Take 1 tablet (75 mcg total) by mouth daily. 90 tablet 3   meclizine  (ANTIVERT ) 25 MG tablet TAKE 1 TABLET BY MOUTH THREE TIMES DAILY AS NEEDED FOR DIZZINESS 45 tablet 1   metoprolol  tartrate (LOPRESSOR ) 25 MG tablet Take 1 tablet (25 mg total) by mouth 2 (two) times daily. 180 tablet 3   Multiple Vitamins-Iron (MULTIVITAMIN/IRON PO) Take 1 tablet by mouth daily.     naloxegol  oxalate (MOVANTIK ) 12.5 MG TABS tablet Take 1 tablet (12.5 mg total) by mouth daily. 30 tablet 3   omeprazole  (PRILOSEC) 40 MG capsule Take 1 capsule (40 mg total) by mouth daily. 90 capsule 3   Oxycodone  HCl 10 MG TABS 10 mg every 4 (four) hours as needed.     promethazine  (PHENERGAN ) 25 MG tablet TAKE 1 TABLET BY MOUTH EVERY 6 HOURS AS NEEDED FOR NAUSEA AND VOMITING 90 tablet 2   simvastatin  (ZOCOR ) 20 MG tablet Take 1 tablet (20 mg total) by mouth at bedtime. 90 tablet 3   traZODone (DESYREL) 50 MG tablet Take 25 mg by mouth at bedtime.     Azelastine  HCl  (ASTEPRO ) 0.15 % SOLN 2 sprays each nostril every 12 hours (Patient not taking: Reported on 07/12/2024) 30 mL 11   calcium carbonate (OSCAL) 1500 (600 Ca) MG TABS tablet Take by mouth daily. (Patient not taking: Reported on 07/12/2024)     No facility-administered medications prior to visit.    Allergies  Allergen Reactions   Cymbalta  [Duloxetine  Hcl]     Facial edema   Iohexol      Code: HIVES, Desc: pt states her face and throat swells when administered IV contrast - KB, Onset Date:  87907990    Mepergan [Meperidine-Promethazine ]     Cardiac arrest   Morphine Rash   Augmentin  [Amoxicillin -Pot Clavulanate] Nausea Only   Citalopram  Other (See Comments)    Jittery/heart racing    Hctz [Hydrochlorothiazide ] Other (See Comments)    Too much urinating, felt dried out, etc--NO ALLERGY   Lexapro [Escitalopram Oxalate] Other (See Comments)    Jittery,heart racing   Lyrica [Pregabalin] Swelling    Face, hands, fingers, ankles   Promethazine  Hcl    Valsartan     REACTION: pt states INTOL to Diovan    Review of Systems As per HPI  PE:    07/12/2024   10:45 AM 07/12/2024   10:43 AM 07/01/2024   11:00 PM  Vitals with BMI  Height  5' 7   Weight  195 lbs   BMI  30.53   Systolic 142 154 870  Diastolic 79 84 86  Pulse  71 69  02 sat 95% RA  Physical Exam  VS: noted--normal. Gen: alert, NAD, NONTOXIC APPEARING. HEENT: eyes without injection, drainage, or swelling.  Ears: EACs clear, TMs with normal light reflex and landmarks.  Nose: Clear rhinorrhea, with some dried, crusty exudate adherent to mildly injected mucosa.  No purulent d/c.  No paranasal sinus TTP.  No facial swelling.  Throat and mouth without focal lesion.  No pharyngial swelling, erythema, or exudate.   Neck: supple, no LAD.   LUNGS: CTA bilat, nonlabored resps.   CV: RRR, no m/r/g. EXT: no c/c/e SKIN: no rash   LABS:  Last CBC Lab Results  Component Value Date   WBC 7.9 07/01/2024   HGB 14.1 07/01/2024    HCT 43.1 07/01/2024   MCV 84.7 07/01/2024   MCH 27.7 07/01/2024   RDW 12.8 07/01/2024   PLT 220 07/01/2024   Last metabolic panel Lab Results  Component Value Date   GLUCOSE 118 (H) 07/01/2024   NA 142 07/01/2024   K 4.0 07/01/2024   CL 106 07/01/2024   CO2 25 07/01/2024   BUN 28 (H) 07/01/2024   CREATININE 0.79 07/01/2024   GFRNONAA >60 07/01/2024   CALCIUM 9.7 07/01/2024   PROT 7.2 07/01/2024   ALBUMIN 4.3 07/01/2024   LABGLOB 3.1 01/16/2024   AGRATIO 1.2 01/16/2024   BILITOT 0.4 07/01/2024   ALKPHOS 98 07/01/2024   AST 18 07/01/2024   ALT 17 07/01/2024   ANIONGAP 12 07/01/2024   COVID TEST HERE TODAY:  POSITIVE.   Flu test neg.  IMPRESSION AND PLAN:  COVID respiratory infection. Continue full 5-day course of Paxlovid. She can continue over-the-counter symptomatic care. Signs/symptoms to call or return for were reviewed and pt expressed understanding.  An After Visit Summary was printed and given to the patient.  FOLLOW UP: Return if symptoms worsen or fail to improve.  Signed:  Gerlene Hockey, MD           07/12/2024

## 2024-07-12 NOTE — Telephone Encounter (Signed)
 Spoke with pt, she picked up the Paxlovid yesterday and started taking it.

## 2024-07-13 NOTE — Telephone Encounter (Signed)
 Refill requested for Alprazolam  sent to [facility] in [place]

## 2024-07-16 ENCOUNTER — Ambulatory Visit: Payer: Self-pay

## 2024-07-16 NOTE — Telephone Encounter (Signed)
 FYI Only or Action Required?: FYI only for provider.  Patient was last seen in primary care on 07/12/2024 by McGowen, Aleene DEL, MD.  Called Nurse Triage reporting Covid Positive.  Symptoms began a week ago.  Interventions attempted: Prescription medications: paxlovid- finished.  Symptoms are: gradually worsening.  Triage Disposition: Go to ED Now (or PCP Triage)  Patient/caregiver understands and will follow disposition?: No, wishes to speak with PCP   Pt refused ER  Copied from CRM #[id number]. Topic: Clinical - Red Word Triage >> Jul 16, 2024  3:08 PM Nessti S wrote: [facility] that prompted transfer to Nurse Triage: has covid, feels bad and sounds very bad Reason for Disposition  [1] Drinking very little AND [2] dehydration suspected (e.g., no urine > 12 hours, very dry mouth, very lightheaded)  Answer Assessment - Initial Assessment Questions Pt states husband was recently in the hospital with covid. She sounds short of breath, is also crying. She denies any shortness of breath. Sounds weak. She also states her mouth is so dry. RN advised pt to go to the ER and she refused. She finished paxlovid.    1. SYMPTOMS: What is your main symptom or concern? (e.g., cough, fever, shortness of breath, muscle aches)     Sore throat, rougher than the devil 2. ONSET: When did the symptoms start?      Over a week  4. FEVER: Do you have a fever? If Yes, ask: What is your temperature, how was it measured, and when did it start?     no 5. BREATHING DIFFICULTY: Are you having any difficulty breathing? (e.g., normal; shortness of breath, wheezing, unable to speak)      States no but is stopping in middle of sentences.  6. BETTER-SAME-WORSE: Are you getting better, staying the same or getting worse compared to yesterday?  If getting worse, ask, In what way?     same 7. OTHER SYMPTOMS: Do you have any other symptoms?  (e.g., chills, fatigue, headache, loss of smell or taste, muscle  pain, sore throat)     Muscle pain, headache, 8. COVID-19 DIAGNOSIS: How do you know that you have COVID? (e.g., positive lab test or self-test, diagnosed by doctor or NP/PA, symptoms after exposure).     home  Protocols used: COVID-19 - Diagnosed or Suspected-A-AH

## 2024-07-18 ENCOUNTER — Inpatient Hospital Stay

## 2024-07-18 ENCOUNTER — Inpatient Hospital Stay: Admitting: Hematology & Oncology

## 2024-07-20 ENCOUNTER — Ambulatory Visit: Admitting: Family Medicine

## 2024-07-24 DIAGNOSIS — K5900 Constipation, unspecified: Secondary | ICD-10-CM | POA: Diagnosis not present

## 2024-07-24 DIAGNOSIS — G894 Chronic pain syndrome: Secondary | ICD-10-CM | POA: Diagnosis not present

## 2024-07-24 DIAGNOSIS — M961 Postlaminectomy syndrome, not elsewhere classified: Secondary | ICD-10-CM | POA: Diagnosis not present

## 2024-07-24 DIAGNOSIS — M1712 Unilateral primary osteoarthritis, left knee: Secondary | ICD-10-CM | POA: Diagnosis not present

## 2024-07-26 ENCOUNTER — Ambulatory Visit: Admitting: Family Medicine

## 2024-08-02 ENCOUNTER — Other Ambulatory Visit: Payer: Self-pay

## 2024-08-02 ENCOUNTER — Inpatient Hospital Stay: Attending: Hematology & Oncology

## 2024-08-02 ENCOUNTER — Inpatient Hospital Stay (HOSPITAL_BASED_OUTPATIENT_CLINIC_OR_DEPARTMENT_OTHER): Admitting: Hematology & Oncology

## 2024-08-02 ENCOUNTER — Encounter: Payer: Self-pay | Admitting: Hematology & Oncology

## 2024-08-02 VITALS — BP 146/54 | HR 69 | Temp 98.3°F | Resp 19 | Ht 67.0 in | Wt 190.0 lb

## 2024-08-02 DIAGNOSIS — D509 Iron deficiency anemia, unspecified: Secondary | ICD-10-CM | POA: Insufficient documentation

## 2024-08-02 DIAGNOSIS — D472 Monoclonal gammopathy: Secondary | ICD-10-CM | POA: Insufficient documentation

## 2024-08-02 DIAGNOSIS — D500 Iron deficiency anemia secondary to blood loss (chronic): Secondary | ICD-10-CM

## 2024-08-02 LAB — CBC WITH DIFFERENTIAL (CANCER CENTER ONLY)
Abs Immature Granulocytes: 0.03 K/uL (ref 0.00–0.07)
Basophils Absolute: 0 K/uL (ref 0.0–0.1)
Basophils Relative: 1 %
Eosinophils Absolute: 0.1 K/uL (ref 0.0–0.5)
Eosinophils Relative: 2 %
HCT: 42.2 % (ref 36.0–46.0)
Hemoglobin: 13.8 g/dL (ref 12.0–15.0)
Immature Granulocytes: 0 %
Lymphocytes Relative: 22 %
Lymphs Abs: 1.6 K/uL (ref 0.7–4.0)
MCH: 27.3 pg (ref 26.0–34.0)
MCHC: 32.7 g/dL (ref 30.0–36.0)
MCV: 83.6 fL (ref 80.0–100.0)
Monocytes Absolute: 0.3 K/uL (ref 0.1–1.0)
Monocytes Relative: 5 %
Neutro Abs: 5 K/uL (ref 1.7–7.7)
Neutrophils Relative %: 70 %
Platelet Count: 189 K/uL (ref 150–400)
RBC: 5.05 MIL/uL (ref 3.87–5.11)
RDW: 13 % (ref 11.5–15.5)
WBC Count: 7.1 K/uL (ref 4.0–10.5)
nRBC: 0 % (ref 0.0–0.2)

## 2024-08-02 LAB — FERRITIN: Ferritin: 161 ng/mL (ref 11–307)

## 2024-08-02 LAB — CMP (CANCER CENTER ONLY)
ALT: 13 U/L (ref 0–44)
AST: 19 U/L (ref 15–41)
Albumin: 4.6 g/dL (ref 3.5–5.0)
Alkaline Phosphatase: 104 U/L (ref 38–126)
Anion gap: 12 (ref 5–15)
BUN: 24 mg/dL — ABNORMAL HIGH (ref 8–23)
CO2: 26 mmol/L (ref 22–32)
Calcium: 9.5 mg/dL (ref 8.9–10.3)
Chloride: 101 mmol/L (ref 98–111)
Creatinine: 0.93 mg/dL (ref 0.44–1.00)
GFR, Estimated: >60 mL/min (ref >60)
Glucose, Bld: 118 mg/dL — ABNORMAL HIGH (ref 70–99)
Potassium: 4.3 mmol/L (ref 3.5–5.1)
Sodium: 139 mmol/L (ref 135–145)
Total Bilirubin: 0.4 mg/dL (ref 0.0–1.2)
Total Protein: 7.5 g/dL (ref 6.5–8.1)

## 2024-08-02 LAB — IRON AND IRON BINDING CAPACITY (CC-WL,HP ONLY)
Iron: 60 ug/dL (ref 28–170)
Saturation Ratios: 16 % (ref 10.4–31.8)
TIBC: 386 ug/dL (ref 250–450)
UIBC: 326 ug/dL

## 2024-08-02 NOTE — Progress Notes (Signed)
 Hematology and Oncology Follow Up Visit  [name] [id number] 1946-08-26 78 y.o. 10/05/2018   Principle Diagnosis:  1. IgM kappa monoclonal gammopathy of undetermined significance 2. Intermittent iron deficiency anemia  Current Therapy:   IV iron as indicated - Ferrlecit 250 mg IV given on 08/03/2023    Interim History:  [name] is here today for follow-up. We see her every 6 months.  I see a lot more often because she always comes in with her husband who has his own health issues.  She seems to be managing.  She really has had no specific complaints.  Again she is much more worried about her husband that she is worried about herself.  When she was last here 6 months ago, her iron saturation was only 10% so she went ahead and got some IV Ferrlecit.  When we last saw her, her monoclonal spike was 0.4 g/dL.  Her IgM level was 470 mg/dL.  The Kappa light chain was 2.4 mg/dL.  She is still trying to manage her husband.  I know that he gets very upset and gets somewhat angry.  She is managing otherwise.  She is eating okay.  She has had no nausea or vomiting.  She has had no change in bowel or bladder habits.  She has had no obvious bleeding.  Overall, I would have to say that her performance status is probably ECOG 1.   Wt Readings from Last 3 Encounters:  07/12/24 195 lb (88.5 kg)  06/22/24 194 lb 12.8 oz (88.4 kg)  05/25/24 191 lb 3.2 oz (86.7 kg)   Medications:  Allergies as of 10/05/2018       Reactions   Cymbalta  [duloxetine  Hcl]    Facial edema   Iohexol     Code: HIVES, Desc: pt states her face and throat swells when administered IV contrast - KB, Onset Date: 87907990   Mepergan [meperidine-promethazine ]    Cardiac arrest   Morphine Rash   Augmentin  [amoxicillin -pot Clavulanate] Nausea Only   Citalopram  Other (See Comments)   Jittery/heart racing   Hctz [hydrochlorothiazide ] Other (See Comments)   Too much urinating, felt dried out, etc--NO ALLERGY    Lexapro [escitalopram Oxalate] Other (See Comments)   Jittery,heart racing   Lyrica [pregabalin] Swelling   Face, hands, fingers, ankles   Valsartan    REACTION: pt states INTOL to Diovan        Medication List        Accurate as of October 05, 2018 12:55 PM. Always use your most recent med list.          acetaminophen  325 MG tablet Commonly known as:  TYLENOL  Take 650 mg by mouth every 6 (six) hours as needed.   ALIGN 4 MG Caps Take 1 capsule (4 mg total) by mouth daily.   ALPRAZolam  0.5 MG tablet Commonly known as:  XANAX  TAKE ONE-HALF TO ONE TABLET BY MOUTH THREE TIMES DAILY   Azelastine  HCl 0.15 % Soln Commonly known as:  ASTEPRO  2 sprays each nostril every 12 hours   calcium carbonate 1500 (600 Ca) MG Tabs tablet Commonly known as:  OSCAL Take by mouth daily.   dicyclomine  10 MG capsule Commonly known as:  BENTYL  Take 1 capsule (10 mg total) by mouth 3 (three) times daily before meals.   hydrocortisone  2.5 % cream Apply 1 application topically as needed. RECTAL ITCHING   levothyroxine  75 MCG tablet Commonly known as:  SYNTHROID , LEVOTHROID TAKE 1/2 (ONE-HALF) TABLET BY MOUTH ONCE DAILY  magnesium (amino acid chelate) 133 MG tablet Take 1 tablet by mouth daily.   meclizine  25 MG tablet Commonly known as:  ANTIVERT  Take 1 tablet (25 mg total) by mouth every 6 (six) hours as needed for dizziness.   MELATONIN PO Take by mouth.   meloxicam  7.5 MG tablet Commonly known as:  MOBIC  TAKE 2 TABLETS (15 MG TOTAL) BY MOUTH DAILY.   metoprolol  tartrate 25 MG tablet Commonly known as:  LOPRESSOR  Take 1 tablet (25 mg total) by mouth daily.   MULTIVITAMIN/IRON PO Take 1 tablet by mouth daily.   NIACIN  PO Take by mouth.   omeprazole  40 MG capsule Commonly known as:  PRILOSEC Take 1 capsule (40 mg total) by mouth daily.   Oxycodone  HCl 10 MG Tabs 10 mg every 4 (four) hours as needed. 1 every 6 hours for pain   promethazine  25 MG tablet Commonly  known as:  PHENERGAN  TAKE ONE TABLET BY MOUTH EVERY 6 HOURS AS NEEDED FOR NAUSEA AND VOMITING   RED YEAST RICE PO Take by mouth.   simvastatin  20 MG tablet Commonly known as:  ZOCOR  Take 1 tablet (20 mg total) by mouth at bedtime.   SLEEP AID 25 MG tablet Generic drug:  doxylamine (Sleep) Take 25 mg by mouth at bedtime as needed.   traZODone 50 MG tablet Commonly known as:  DESYREL Take 1 tablet by mouth at bedtime.   Vitamin D3 125 MCG (5000 UT) Caps Take 2 capsules by mouth daily.        Allergies:  Allergies  Allergen Reactions   Cymbalta  [Duloxetine  Hcl]     Facial edema   Iohexol      Code: HIVES, Desc: pt states her face and throat swells when administered IV contrast - KB, Onset Date: 87907990    Mepergan [Meperidine-Promethazine ]     Cardiac arrest   Morphine Rash   Augmentin  [Amoxicillin -Pot Clavulanate] Nausea Only   Citalopram  Other (See Comments)    Jittery/heart racing    Hctz [Hydrochlorothiazide ] Other (See Comments)    Too much urinating, felt dried out, etc--NO ALLERGY   Lexapro [Escitalopram Oxalate] Other (See Comments)    Jittery,heart racing   Lyrica [Pregabalin] Swelling    Face, hands, fingers, ankles   Valsartan     REACTION: pt states INTOL to Diovan    Past Medical History, Surgical history, Social history, and Family History were reviewed and updated.  Review of Systems: Review of Systems  Constitutional: Positive for malaise/fatigue.  Eyes: Positive for blurred vision.  Respiratory: Positive for wheezing.   Cardiovascular: Positive for palpitations.  Gastrointestinal: Positive for nausea.  Genitourinary: Positive for urgency.  Musculoskeletal: Positive for back pain, joint pain and myalgias.  Neurological: Positive for tingling.  Endo/Heme/Allergies: Negative.   Psychiatric/Behavioral: Negative.      Physical Exam:  weight is 174 lb (78.9 kg). Her oral temperature is 97.8 F (36.6 C). Her blood pressure is 135/52  (abnormal) and her pulse is 52 (abnormal). Her respiration is 18 and oxygen saturation is 98%.   Wt Readings from Last 3 Encounters:  10/05/18 174 lb (78.9 kg)  09/05/18 178 lb (80.7 kg)  07/24/18 174 lb 8 oz (79.2 kg)    Physical Exam Vitals signs reviewed.  HENT:     Head: Normocephalic and atraumatic.  Eyes:     Pupils: Pupils are equal, round, and reactive to light.  Neck:     Musculoskeletal: Normal range of motion.  Cardiovascular:     Rate and Rhythm:  Normal rate and regular rhythm.     Heart sounds: Normal heart sounds.  Pulmonary:     Effort: Pulmonary effort is normal.     Breath sounds: Normal breath sounds.  Abdominal:     General: Bowel sounds are normal.     Palpations: Abdomen is soft.  Musculoskeletal: Normal range of motion.        General: No tenderness or deformity.  Lymphadenopathy:     Cervical: No cervical adenopathy.  Skin:    General: Skin is warm and dry.     Findings: No erythema or rash.  Neurological:     Mental Status: She is alert and oriented to person, place, and time.  Psychiatric:        Behavior: Behavior normal.        Thought Content: Thought content normal.        Judgment: Judgment normal.     Lab Results  Component Value Date   WBC 14.1 (H) 10/05/2018   HGB 14.8 10/05/2018   HCT 45.9 10/05/2018   MCV 91.4 10/05/2018   PLT 260 10/05/2018   Lab Results  Component Value Date   FERRITIN 143 04/06/2018   IRON 31 (L) 04/06/2018   TIBC 284 04/06/2018   UIBC 253 04/06/2018   IRONPCTSAT 11 (L) 04/06/2018   Lab Results  Component Value Date   RETICCTPCT 1.2 01/20/2011   RBC 5.02 10/05/2018   RETICCTABS 52.2 01/20/2011   Lab Results  Component Value Date   KPAFRELGTCHN 17.3 04/06/2018   LAMBDASER 16.6 04/06/2018   KAPLAMBRATIO 1.04 04/06/2018   Lab Results  Component Value Date   IGGSERUM 667 (L) 04/06/2018   IGA 287 04/06/2018   IGMSERUM 630 (H) 04/06/2018   Lab Results  Component Value Date   TOTALPROTELP 6.7  04/06/2018   ALBUMINELP 3.3 04/06/2018   A1GS 0.2 04/06/2018   A2GS 0.9 04/06/2018   BETS 1.0 04/06/2018   BETA2SER 0.5 06/19/2015   GAMS 1.2 04/06/2018   MSPIKE 0.5 (H) 04/06/2018   SPEI Comment 10/06/2017     Chemistry      Component Value Date/Time   NA 142 10/05/2018 1108   NA 138 03/25/2017 1333   NA 138 11/25/2016 1147   K 3.8 10/05/2018 1108   K 4.3 03/25/2017 1333   K 3.8 11/25/2016 1147   CL 103 10/05/2018 1108   CL 104 03/25/2017 1333   CL 103 01/15/2010 0835   CO2 29 10/05/2018 1108   CO2 24 03/25/2017 1333   CO2 25 11/25/2016 1147   BUN 12 10/05/2018 1108   BUN 12 03/25/2017 1333   BUN 15.3 11/25/2016 1147   CREATININE 0.93 10/05/2018 1108   CREATININE 0.76 03/25/2017 1333   CREATININE 0.8 11/25/2016 1147      Component Value Date/Time   CALCIUM 9.4 10/05/2018 1108   CALCIUM 9.5 03/25/2017 1333   CALCIUM 9.2 11/25/2016 1147   ALKPHOS 104 10/05/2018 1108   ALKPHOS 111 03/25/2017 1333   ALKPHOS 99 11/25/2016 1147   AST 11 (L) 10/05/2018 1108   AST 24 11/25/2016 1147   ALT 14 10/05/2018 1108   ALT 30 11/25/2016 1147   BILITOT 0.5 10/05/2018 1108   BILITOT 0.61 11/25/2016 1147      Impression and Plan: [name] is a very pleasant 78 yo caucasian female with MGUS and history of iron deficiency.   We will have to see what the labs look like.  I will have to think that her iron should be  okay.  Hopefully, the monoclonal labs are holding steady.  Again I see her quite often as we see her husband.  We will go ahead and plan to get her back in 3 months.  Maude JONELLE Crease MD

## 2024-08-03 ENCOUNTER — Ambulatory Visit: Payer: Self-pay | Admitting: Hematology & Oncology

## 2024-08-03 ENCOUNTER — Encounter: Payer: Self-pay | Admitting: *Deleted

## 2024-08-03 LAB — IGG, IGA, IGM
IgA: 284 mg/dL (ref 64–422)
IgG (Immunoglobin G), Serum: 843 mg/dL (ref 586–1602)
IgM (Immunoglobulin M), Srm: 551 mg/dL — ABNORMAL HIGH (ref 26–217)

## 2024-08-03 LAB — KAPPA/LAMBDA LIGHT CHAINS
Kappa free light chain: 21.3 mg/L — ABNORMAL HIGH (ref 3.3–19.4)
Kappa, lambda light chain ratio: 1.05 (ref 0.26–1.65)
Lambda free light chains: 20.2 mg/L (ref 5.7–26.3)

## 2024-08-08 LAB — PROTEIN ELECTROPHORESIS, SERUM, WITH REFLEX
A/G Ratio: 1.1 (ref 0.7–1.7)
Albumin ELP: 3.8 g/dL (ref 2.9–4.4)
Alpha-1-Globulin: 0.2 g/dL (ref 0.0–0.4)
Alpha-2-Globulin: 0.9 g/dL (ref 0.4–1.0)
Beta Globulin: 1.2 g/dL (ref 0.7–1.3)
Gamma Globulin: 1.3 g/dL (ref 0.4–1.8)
Globulin, Total: 3.6 g/dL (ref 2.2–3.9)
M-Spike, %: 0.4 g/dL — ABNORMAL HIGH
SPEP Interpretation: 0
Total Protein ELP: 7.4 g/dL (ref 6.0–8.5)

## 2024-08-08 LAB — IMMUNOFIXATION REFLEX, SERUM
IgA: 287 mg/dL (ref 64–422)
IgG (Immunoglobin G), Serum: 858 mg/dL (ref 586–1602)
IgM (Immunoglobulin M), Srm: 537 mg/dL — ABNORMAL HIGH (ref 26–217)

## 2024-08-10 ENCOUNTER — Ambulatory Visit: Admitting: Family Medicine

## 2024-08-22 DIAGNOSIS — M5441 Lumbago with sciatica, right side: Secondary | ICD-10-CM | POA: Diagnosis not present

## 2024-08-22 DIAGNOSIS — M25562 Pain in left knee: Secondary | ICD-10-CM | POA: Diagnosis not present

## 2024-08-22 DIAGNOSIS — M5442 Lumbago with sciatica, left side: Secondary | ICD-10-CM | POA: Diagnosis not present

## 2024-08-22 DIAGNOSIS — G8929 Other chronic pain: Secondary | ICD-10-CM | POA: Diagnosis not present

## 2024-08-23 DIAGNOSIS — M1712 Unilateral primary osteoarthritis, left knee: Secondary | ICD-10-CM | POA: Diagnosis not present

## 2024-08-23 DIAGNOSIS — K5900 Constipation, unspecified: Secondary | ICD-10-CM | POA: Diagnosis not present

## 2024-08-23 DIAGNOSIS — G894 Chronic pain syndrome: Secondary | ICD-10-CM | POA: Diagnosis not present

## 2024-08-23 DIAGNOSIS — M961 Postlaminectomy syndrome, not elsewhere classified: Secondary | ICD-10-CM | POA: Diagnosis not present

## 2024-08-24 ENCOUNTER — Encounter: Payer: Self-pay | Admitting: Family Medicine

## 2024-08-24 ENCOUNTER — Ambulatory Visit: Admitting: Family Medicine

## 2024-08-24 VITALS — BP 135/79 | HR 69 | Temp 97.8°F | Ht 67.0 in | Wt 187.0 lb

## 2024-08-24 DIAGNOSIS — F411 Generalized anxiety disorder: Secondary | ICD-10-CM | POA: Diagnosis not present

## 2024-08-24 DIAGNOSIS — F331 Major depressive disorder, recurrent, moderate: Secondary | ICD-10-CM | POA: Diagnosis not present

## 2024-08-24 DIAGNOSIS — K5903 Drug induced constipation: Secondary | ICD-10-CM

## 2024-08-24 DIAGNOSIS — Z79899 Other long term (current) drug therapy: Secondary | ICD-10-CM

## 2024-08-24 DIAGNOSIS — T402X5A Adverse effect of other opioids, initial encounter: Secondary | ICD-10-CM | POA: Diagnosis not present

## 2024-08-24 NOTE — Progress Notes (Signed)
 OFFICE VISIT  08/24/2024  CC:  Chief Complaint  Patient presents with   Medical Management of Chronic Issues    Patient is a 78 y.o. female who presents for 60-month follow-up anxiety, depression, and opioid-induced constipation. A/P as of last visit: #1 GAD with superimposed adjustment disorder with mixed anxious and depressed mood. Coping appropriately but understandably she is struggling a lot. Empathic listening today. No changes in treatment. She knows she can come back anytime she needs to.  Otherwise we will see her back in a month to recheck. Continue alprazolam  1 mg, 1-2 twice daily as needed. Controlled substance contract is up-to-date.  New prescription was not needed today.   #2 opioid-induced constipation. Movantik  is helping.  She will check to see which strength pill are in her samples at home.  If they are 12.5 mg then I advised her to take 2 of them daily, and if they are 25 mg then she will take 1 daily.  (In review of her chart it appears we have tried Linzess  and Amitiza  back in 2022 but is not clear whether she did not continue these due to cost or ineffectiveness.)   #3 acquired hypothyroidism.  Has been doing well on 75 mcg levothyroxine  daily. TSH 1.46 about 1 month ago.  INTERIM HX: Eloyce is struggling but hanging in there.  She cares for [name] very well. She does not sleep much at all.  She is eating fine.  Bowels are doing okay.  She still has to give an enema sometimes.  She takes a stool softener.  The Movantik  did not help.  Past Medical History:  Diagnosis Date   Allergic rhinitis    Anxiety    Chronic gastritis    dx'd by EGD 2018 (erosive, likely NSAID-induced). 06/2019 +gastritis.   Chronic pain syndrome    right knee osteo, hx of TKA in R knee.  Pt was told by [name] that no further surgical options are available for her; also, chronic lower back pain.--[name].   Chronic renal insufficiency, stage II (mild) 2015   vs  acute fluctuations depending on hydration?---GFR from 60s up to 80s.   Diverticulosis of colon    with hx of 'itis   DJD (degenerative joint disease)    L spine and knees.  R knee inject 03/15/18, L knee inject 12/26/18 (by Dr. Gaspar).   FATIGUE / MALAISE 07/01/2009   Chronic   GERD (gastroesophageal reflux disease)    History of colonic polyps    HTN (hypertension)    Hypercholesterolemia    recommended restart of simvastatin  07/2018   Hypothyroid 07/05/2012   IBS (irritable bowel syndrome)    IC (interstitial cystitis)    [name] at [facility]   Iron deficiency anemia, unspecified 06/27/2013   Has required IV iron on multiple occasions (Dr. Timmy).  Hemoccults neg 12/2016. Most recent hem f/u labs normal 09/2020.   Lumbar back pain    Spinal stenosis, lumbar region with neurogenic claudication L5-S1 sx's--ESI's no help.  CT L spine stable post-surg appearance as of 12/2019   Major depression, recurrent    vs dysthymia,chronic   Memory impairment 10/03/2012   Memory impairment 10/03/2012 >referral to neuro     Monoclonal gammopathy of undetermined significance    IgM kappa MGUS (Dr. Timmy) routine f/u has shown stability of this problem (most recent f/u 09/2020)   OSA (obstructive sleep apnea)    Latest sleep study 01/2017.  Some adjustments to CPAP being made+ dental  referral for consideration of oral appliance --per pulm MD.   Osteopenia 04/2014; 03/2018   2015: T score -2.0 FRAX 11%/1.8%.  2019 FRAX 10%/1.5%.  10/2021 T score -1.6 radius, -0.9 femur   Palpitations    Psoriasis    Trochanteric bursitis of both hips    + injections by ortho in the past   Vertigo 2020/21   6+ wks, ->to ENT->?vestib neuronitis with ongoing motion sensitivity, gradually resolving.(Dr. Arlana 10/18/19). MRI brain normal 09/2020.   Vitamin D  deficiency    Xerostomia    and xerophthalmia--per Dr. Arlana (he suggests SSA, SSB, ESR, rh factor, ANA, and antimicrosmal and antithyroglobulin ab's as of  02/17/16--all these labs were NORMAL.    Past Surgical History:  Procedure Laterality Date   ABDOMINAL HYSTERECTOMY     for prolapse with abdominal incision   BACK SURGERY  01/23/2015   Lumbar decompression with L2-L5 fusion (Dr. Malcolm)   CARDIAC CATHETERIZATION     no PCI   CHOLECYSTECTOMY     COLONOSCOPY  04/2006;07/16/19   Diverticulosis, o/w normal.  06/2019->2 adenomas, +Diverticulosis.  Recall 2023-2025.   DEXA  04/06/2018   T score -1.5 femur neck, -2.2 radius.  FRAX 10%/1.5%.  10/2021 T score -1.6 radius, -0.9 femur. rpt 2 yrs   ESOPHAGOGASTRODUODENOSCOPY  05/29/2009; 03/01/17;07/16/19   Esoph normal.  Gastritis (H pylori NEG) + gastric polyps (benign).  Duodenal biopsy NORMAL.  2018-mild chronic gastritis, H pylori NEG. 06/2019 tortuous esophagus w/out stenosis +chronic gastritis H pyl neg.   EYE SURGERY Left    lazy eye   HAND SURGERY Right    TRIGGER FINGER; index finger   HERNIA REPAIR     umbilical   KNEE ARTHROSCOPY     RIGHT KNEE X 5   LAMINOTOMY  2010   L-4, L-5 FUSION   NASAL SINUS SURGERY     polypectomy (remote past)   OOPHORECTOMY  2010   Laparoscopic BSO (path benign)   PATELLA FRACTURE SURGERY  06/2007   Dr. Ernie   POSTERIOR LAMINECTOMY / DECOMPRESSION LUMBAR SPINE  05/2008   Dr. Louis   TOTAL KNEE ARTHROPLASTY Right 12-07 and 4-07   Dr. Duwayne, revision 12-09 Dr. Jenna at [facility]    Outpatient Medications Prior to Visit  Medication Sig Dispense Refill   ALPRAZolam  (XANAX ) 1 MG tablet TAKE 1 TO 2 TABLETS BY MOUTH TWICE DAILY AS NEEDED 120 tablet 5   diclofenac Sodium (VOLTAREN) 1 % GEL Apply 4 g topically 4 (four) times daily.     hydrocortisone  2.5 % cream Apply 1 application  topically as needed. RECTAL ITCHING     levothyroxine  (SYNTHROID ) 75 MCG tablet Take 1 tablet (75 mcg total) by mouth daily. 90 tablet 3   meclizine  (ANTIVERT ) 25 MG tablet TAKE 1 TABLET BY MOUTH THREE TIMES DAILY AS NEEDED FOR DIZZINESS (Patient taking differently: as needed. TAKE 1  TABLET BY MOUTH THREE TIMES DAILY AS NEEDED FOR DIZZINESS) 45 tablet 1   metoprolol  tartrate (LOPRESSOR ) 25 MG tablet Take 1 tablet (25 mg total) by mouth 2 (two) times daily. 180 tablet 3   Multiple Vitamins-Iron (MULTIVITAMIN/IRON PO) Take 1 tablet by mouth daily.     omeprazole  (PRILOSEC) 40 MG capsule Take 1 capsule (40 mg total) by mouth daily. 90 capsule 3   Oxycodone  HCl 10 MG TABS 10 mg every 4 (four) hours as needed.     promethazine  (PHENERGAN ) 25 MG tablet TAKE 1 TABLET BY MOUTH EVERY 6 HOURS AS NEEDED FOR NAUSEA AND VOMITING (Patient  taking differently: as needed. TAKE 1 TABLET BY MOUTH EVERY 6 HOURS AS NEEDED FOR NAUSEA AND VOMITING) 90 tablet 2   simvastatin  (ZOCOR ) 20 MG tablet Take 1 tablet (20 mg total) by mouth at bedtime. 90 tablet 3   traZODone (DESYREL) 50 MG tablet Take 25 mg by mouth at bedtime.     Azelastine  HCl (ASTEPRO ) 0.15 % SOLN 2 sprays each nostril every 12 hours (Patient not taking: Reported on 08/24/2024) 30 mL 11   calcium carbonate (OSCAL) 1500 (600 Ca) MG TABS tablet Take by mouth daily. (Patient not taking: Reported on 08/24/2024)     Cholecalciferol (VITAMIN D3) 5000 units CAPS Take 4 capsules by mouth daily.  (Patient not taking: Reported on 08/24/2024)     naloxegol  oxalate (MOVANTIK ) 12.5 MG TABS tablet Take 1 tablet (12.5 mg total) by mouth daily. (Patient not taking: Reported on 08/24/2024) 30 tablet 3   No facility-administered medications prior to visit.    Allergies  Allergen Reactions   Cymbalta  [Duloxetine  Hcl]     Facial edema   Iohexol      Code: HIVES, Desc: pt states her face and throat swells when administered IV contrast - KB, Onset Date: 87907990    Mepergan [Meperidine-Promethazine ]     Cardiac arrest   Morphine Rash   Augmentin  [Amoxicillin -Pot Clavulanate] Nausea Only   Citalopram  Other (See Comments)    Jittery/heart racing    Hctz [Hydrochlorothiazide ] Other (See Comments)    Too much urinating, felt dried out, etc--NO ALLERGY    Lexapro [Escitalopram Oxalate] Other (See Comments)    Jittery,heart racing   Lyrica [Pregabalin] Swelling    Face, hands, fingers, ankles   Promethazine  Hcl    Valsartan     REACTION: pt states INTOL to Diovan    Review of Systems As per HPI  PE:    08/24/2024    1:15 PM 08/02/2024    4:00 PM 07/12/2024   10:45 AM  Vitals with BMI  Height 5' 7 5' 7   Weight 187 lbs 190 lbs   BMI 29.28 29.75   Systolic 135 146 857  Diastolic 79 54 79  Pulse 69 69      Physical Exam  Gen: Alert, well appearing.  Patient is oriented to person, place, time, and situation. AFFECT: pleasant, lucid thought and speech. No further exam today  LABS:  Last metabolic panel Lab Results  Component Value Date   GLUCOSE 118 (H) 08/02/2024   NA 139 08/02/2024   K 4.3 08/02/2024   CL 101 08/02/2024   CO2 26 08/02/2024   BUN 24 (H) 08/02/2024   CREATININE 0.93 08/02/2024   GFRNONAA >60 08/02/2024   CALCIUM 9.5 08/02/2024   PROT 7.5 08/02/2024   ALBUMIN 4.6 08/02/2024   LABGLOB 3.6 08/02/2024   AGRATIO 1.1 08/02/2024   BILITOT 0.4 08/02/2024   ALKPHOS 104 08/02/2024   AST 19 08/02/2024   ALT 13 08/02/2024   ANIONGAP 12 08/02/2024   Last thyroid  functions Lab Results  Component Value Date   TSH 1.520 07/01/2024   T3TOTAL 137 06/26/2020   FREET4 1.31 11/11/2021   THYROIDAB <1 02/24/2016   IMPRESSION AND PLAN:  #1 GAD with superimposed adjustment disorder with mixed anxious and depressed mood. Coping appropriately but understandably she is struggling a lot. Empathic listening today. No changes in treatment. She knows she can come back anytime she needs to.  Otherwise we will see her back in a month to recheck. Continue alprazolam  1 mg, 1-2 twice  daily as needed.   #2 opioid-induced constipation. No prescription constipation medication has helped. She will continue with stool softener and as needed enema.   #3 acquired hypothyroidism.  Has been doing well on 75 mcg  levothyroxine  daily. TSH 1.46 about 2 months ago.  An After Visit Summary was printed and given to the patient.  FOLLOW UP: Return in about 4 weeks (around 09/21/2024) for routine chronic illness f/u.  Signed:  Gerlene Hockey, MD           08/24/2024

## 2024-08-24 NOTE — Patient Instructions (Signed)
 I'm praying for rest and peace for you and [name].

## 2024-09-12 ENCOUNTER — Ambulatory Visit

## 2024-09-20 ENCOUNTER — Encounter: Payer: Self-pay | Admitting: Family

## 2024-09-21 ENCOUNTER — Ambulatory Visit: Admitting: Family Medicine

## 2024-10-02 ENCOUNTER — Encounter: Payer: Self-pay | Admitting: Family Medicine

## 2024-10-02 NOTE — Telephone Encounter (Signed)
 Spoke with pt's wife, advised we would send a message from his chart to PCP for further recommendations. She voiced understanding

## 2024-10-04 ENCOUNTER — Encounter: Payer: Self-pay | Admitting: Family Medicine

## 2024-10-04 ENCOUNTER — Ambulatory Visit: Admitting: Family Medicine

## 2024-10-04 VITALS — BP 135/78 | HR 73 | Temp 97.2°F | Ht 67.0 in | Wt 191.0 lb

## 2024-10-04 DIAGNOSIS — F5104 Psychophysiologic insomnia: Secondary | ICD-10-CM | POA: Diagnosis not present

## 2024-10-04 DIAGNOSIS — F411 Generalized anxiety disorder: Secondary | ICD-10-CM

## 2024-10-04 DIAGNOSIS — Z79899 Other long term (current) drug therapy: Secondary | ICD-10-CM | POA: Diagnosis not present

## 2024-10-04 MED ORDER — METOPROLOL SUCCINATE ER 50 MG PO TB24: ORAL_TABLET | ORAL | 3 refills | Status: AC

## 2024-10-04 MED ORDER — PROMETHAZINE HCL 25 MG PO TABS: ORAL_TABLET | ORAL | 2 refills | Status: AC

## 2024-10-04 NOTE — Progress Notes (Signed)
 OFFICE VISIT  10/04/2024  CC:  Chief Complaint  Patient presents with   Medical Management of Chronic Issues    Patient is a 79 y.o. female who presents for 6-week follow-up anxiety. A/P as of last visit: #1 GAD with superimposed adjustment disorder with mixed anxious and depressed mood. Coping appropriately but understandably she is struggling a lot. Empathic listening today. No changes in treatment. She knows she can come back anytime she needs to.  Otherwise we will see her back in a month to recheck. Continue alprazolam  1 mg, 1-2 twice daily as needed.   #2 opioid-induced constipation. No prescription constipation medication has helped. She will continue with stool softener and as needed enema.   #3 acquired hypothyroidism.  Has been doing well on 75 mcg levothyroxine  daily. TSH 1.46 about 2 months ago.  INTERIM HX: Chronic anxiety and depression pretty stable. Poor sleep.  She does wake in the night with some panicky feeling and feeling of palpitations. She takes Lopressor  25 mg twice a day.  PMP AWARE reviewed today: most recent rx for alprazolam  was filled 08/24/2024, # 120, rx by me. No red flags.   Past Medical History:  Diagnosis Date   Allergic rhinitis    Anxiety    Chronic gastritis    dx'd by EGD 2018 (erosive, likely NSAID-induced). 06/2019 +gastritis.   Chronic pain syndrome    right knee osteo, hx of TKA in R knee.  Pt was told by [name] that no further surgical options are available for her; also, chronic lower back pain.--[name].   Chronic renal insufficiency, stage II (mild) 2015   vs acute fluctuations depending on hydration?---GFR from 60s up to 80s.   Diverticulosis of colon    with hx of 'itis   DJD (degenerative joint disease)    L spine and knees.  R knee inject 03/15/18, L knee inject 12/26/18 (by Dr. Gaspar).   FATIGUE / MALAISE 07/01/2009   Chronic   GERD (gastroesophageal reflux disease)    History of colonic  polyps    HTN (hypertension)    Hypercholesterolemia    recommended restart of simvastatin  07/2018   Hypothyroid 07/05/2012   IBS (irritable bowel syndrome)    IC (interstitial cystitis)    [name] at [facility]   Iron deficiency anemia, unspecified 06/27/2013   Has required IV iron on multiple occasions (Dr. Timmy).  Hemoccults neg 12/2016. Most recent hem f/u labs normal 09/2020.   Lumbar back pain    Spinal stenosis, lumbar region with neurogenic claudication L5-S1 sx's--ESI's no help.  CT L spine stable post-surg appearance as of 12/2019   Major depression, recurrent    vs dysthymia,chronic   Memory impairment 10/03/2012   Memory impairment 10/03/2012 >referral to neuro     Monoclonal gammopathy of undetermined significance    IgM kappa MGUS (Dr. Timmy) routine f/u has shown stability of this problem (most recent f/u 09/2020)   OSA (obstructive sleep apnea)    Latest sleep study 01/2017.  Some adjustments to CPAP being made+ dental referral for consideration of oral appliance --per pulm MD.   Osteopenia 04/2014; 03/2018   2015: T score -2.0 FRAX 11%/1.8%.  2019 FRAX 10%/1.5%.  10/2021 T score -1.6 radius, -0.9 femur   Palpitations    Psoriasis    Trochanteric bursitis of both hips    + injections by ortho in the past   Vertigo 2020/21   6+ wks, ->to ENT->?vestib neuronitis with ongoing motion sensitivity, gradually resolving.(Dr. Arlana  10/18/19). MRI brain normal 09/2020.   Vitamin D  deficiency    Xerostomia    and xerophthalmia--per Dr. Arlana (he suggests SSA, SSB, ESR, rh factor, ANA, and antimicrosmal and antithyroglobulin ab's as of 02/17/16--all these labs were NORMAL.    Past Surgical History:  Procedure Laterality Date   ABDOMINAL HYSTERECTOMY     for prolapse with abdominal incision   BACK SURGERY  01/23/2015   Lumbar decompression with L2-L5 fusion (Dr. Malcolm)   CARDIAC CATHETERIZATION     no PCI   CHOLECYSTECTOMY     COLONOSCOPY  04/2006;07/16/19    Diverticulosis, o/w normal.  06/2019->2 adenomas, +Diverticulosis.  Recall 2023-2025.   DEXA  04/06/2018   T score -1.5 femur neck, -2.2 radius.  FRAX 10%/1.5%.  10/2021 T score -1.6 radius, -0.9 femur. rpt 2 yrs   ESOPHAGOGASTRODUODENOSCOPY  05/29/2009; 03/01/17;07/16/19   Esoph normal.  Gastritis (H pylori NEG) + gastric polyps (benign).  Duodenal biopsy NORMAL.  2018-mild chronic gastritis, H pylori NEG. 06/2019 tortuous esophagus w/out stenosis +chronic gastritis H pyl neg.   EYE SURGERY Left    lazy eye   HAND SURGERY Right    TRIGGER FINGER; index finger   HERNIA REPAIR     umbilical   KNEE ARTHROSCOPY     RIGHT KNEE X 5   LAMINOTOMY  2010   L-4, L-5 FUSION   NASAL SINUS SURGERY     polypectomy (remote past)   OOPHORECTOMY  2010   Laparoscopic BSO (path benign)   PATELLA FRACTURE SURGERY  06/2007   Dr. Ernie   POSTERIOR LAMINECTOMY / DECOMPRESSION LUMBAR SPINE  05/2008   Dr. Louis   TOTAL KNEE ARTHROPLASTY Right 12-07 and 4-07   Dr. Duwayne, revision 12-09 Dr. Jenna at [facility]    Outpatient Medications Prior to Visit  Medication Sig Dispense Refill   ALPRAZolam  (XANAX ) 1 MG tablet TAKE 1 TO 2 TABLETS BY MOUTH TWICE DAILY AS NEEDED 120 tablet 5   BIOTIN PO Take 5 mg by mouth daily.     diclofenac Sodium (VOLTAREN) 1 % GEL Apply 4 g topically 4 (four) times daily.     hydrocortisone  2.5 % cream Apply 1 application  topically as needed. RECTAL ITCHING     levothyroxine  (SYNTHROID ) 75 MCG tablet Take 1 tablet (75 mcg total) by mouth daily. 90 tablet 3   meclizine  (ANTIVERT ) 25 MG tablet TAKE 1 TABLET BY MOUTH THREE TIMES DAILY AS NEEDED FOR DIZZINESS (Patient taking differently: as needed for dizziness. TAKE 1 TABLET BY MOUTH THREE TIMES DAILY AS NEEDED FOR DIZZINESS) 45 tablet 1   omeprazole  (PRILOSEC) 40 MG capsule Take 1 capsule (40 mg total) by mouth daily. 90 capsule 3   Oxycodone  HCl 10 MG TABS 10 mg every 4 (four) hours as needed.     simvastatin  (ZOCOR ) 20 MG tablet Take 1 tablet  (20 mg total) by mouth at bedtime. 90 tablet 3   metoprolol  tartrate (LOPRESSOR ) 25 MG tablet Take 1 tablet (25 mg total) by mouth 2 (two) times daily. 180 tablet 3   promethazine  (PHENERGAN ) 25 MG tablet TAKE 1 TABLET BY MOUTH EVERY 6 HOURS AS NEEDED FOR NAUSEA AND VOMITING (Patient taking differently: as needed for nausea or vomiting. TAKE 1 TABLET BY MOUTH EVERY 6 HOURS AS NEEDED FOR NAUSEA AND VOMITING) 90 tablet 2   Azelastine  HCl (ASTEPRO ) 0.15 % SOLN 2 sprays each nostril every 12 hours (Patient not taking: Reported on 10/04/2024) 30 mL 11   calcium carbonate (OSCAL) 1500 (600 Ca)  MG TABS tablet Take by mouth daily. (Patient not taking: Reported on 10/04/2024)     Cholecalciferol (VITAMIN D3) 5000 units CAPS Take 4 capsules by mouth daily.  (Patient not taking: Reported on 10/04/2024)     Multiple Vitamins-Iron (MULTIVITAMIN/IRON PO) Take 1 tablet by mouth daily. (Patient not taking: Reported on 10/04/2024)     naloxegol  oxalate (MOVANTIK ) 12.5 MG TABS tablet Take 1 tablet (12.5 mg total) by mouth daily. (Patient not taking: Reported on 10/04/2024) 30 tablet 3   traZODone (DESYREL) 50 MG tablet Take 25 mg by mouth at bedtime.     No facility-administered medications prior to visit.    Allergies[1]  Review of Systems As per HPI  PE:    10/04/2024    2:10 PM 08/24/2024    1:15 PM 08/02/2024    4:00 PM  Vitals with BMI  Height 5' 7 5' 7 5' 7  Weight 191 lbs 187 lbs 190 lbs  BMI 29.91 29.28 29.75  Systolic 135 135 853  Diastolic 78 79 54  Pulse 73 69 69     Physical Exam  Gen: Alert, well appearing.  Patient is oriented to person, place, time, and situation. AFFECT: pleasant, lucid thought and speech. CV: RRR, no m/r/g.   LUNGS: CTA bilat, nonlabored resps, good aeration in all lung fields.   LABS:  Last CBC Lab Results  Component Value Date   WBC 7.1 08/02/2024   HGB 13.8 08/02/2024   HCT 42.2 08/02/2024   MCV 83.6 08/02/2024   MCH 27.3 08/02/2024   RDW 13.0  08/02/2024   PLT 189 08/02/2024   Last metabolic panel Lab Results  Component Value Date   GLUCOSE 118 (H) 08/02/2024   NA 139 08/02/2024   K 4.3 08/02/2024   CL 101 08/02/2024   CO2 26 08/02/2024   BUN 24 (H) 08/02/2024   CREATININE 0.93 08/02/2024   GFRNONAA >60 08/02/2024   CALCIUM 9.5 08/02/2024   PROT 7.5 08/02/2024   ALBUMIN 4.6 08/02/2024   LABGLOB 3.6 08/02/2024   AGRATIO 1.1 08/02/2024   BILITOT 0.4 08/02/2024   ALKPHOS 104 08/02/2024   AST 19 08/02/2024   ALT 13 08/02/2024   ANIONGAP 12 08/02/2024   Lab Results  Component Value Date   IRON 60 08/02/2024   TIBC 386 08/02/2024   FERRITIN 161 08/02/2024   Lab Results  Component Value Date   TSH 1.520 07/01/2024   IMPRESSION AND PLAN:  1 GAD with superimposed adjustment disorder with mixed anxious and depressed mood. Coping appropriately but understandably she is struggling a lot. Empathic listening today. No changes in treatment. She knows she can come back anytime she needs to.  Otherwise we will see her back in a month to recheck. Continue alprazolam  1 mg, 1-2 twice daily as needed.   #2 opioid-induced constipation. No prescription constipation medication has helped. She will continue with stool softener and as needed enema.   #3 acquired hypothyroidism.  Has been doing well on 75 mcg levothyroxine  daily. TSH 1.52 about 3 months ago.  An After Visit Summary was printed and given to the patient.  FOLLOW UP: Return in about 4 weeks (around 11/01/2024) for routine chronic illness f/u.  Signed:  Gerlene Hockey, MD           10/04/2024      [1]  Allergies Allergen Reactions   Cymbalta  [Duloxetine  Hcl]     Facial edema   Iohexol      Code: HIVES, Desc: pt states her  face and throat swells when administered IV contrast - KB, Onset Date: 87907990    Mepergan [Meperidine-Promethazine ]     Cardiac arrest   Morphine Rash   Augmentin  [Amoxicillin -Pot Clavulanate] Nausea Only   Citalopram  Other (See  Comments)    Jittery/heart racing    Hctz [Hydrochlorothiazide ] Other (See Comments)    Too much urinating, felt dried out, etc--NO ALLERGY   Lexapro [Escitalopram Oxalate] Other (See Comments)    Jittery,heart racing   Lyrica [Pregabalin] Swelling    Face, hands, fingers, ankles   Promethazine  Hcl    Valsartan     REACTION: pt states INTOL to Diovan

## 2024-10-07 ENCOUNTER — Encounter: Payer: Self-pay | Admitting: Family Medicine

## 2024-10-08 MED ORDER — AMOXICILLIN 500 MG PO CAPS: ORAL_CAPSULE | ORAL | 2 refills | Status: AC

## 2024-10-08 NOTE — Telephone Encounter (Signed)
Okay, amoxicillin prescription sent

## 2024-10-11 ENCOUNTER — Inpatient Hospital Stay

## 2024-10-12 ENCOUNTER — Inpatient Hospital Stay: Attending: Hematology & Oncology

## 2024-10-24 ENCOUNTER — Encounter (HOSPITAL_BASED_OUTPATIENT_CLINIC_OR_DEPARTMENT_OTHER): Payer: Self-pay | Admitting: Emergency Medicine

## 2024-10-24 ENCOUNTER — Encounter: Payer: Self-pay | Admitting: Family

## 2024-10-24 ENCOUNTER — Emergency Department (HOSPITAL_BASED_OUTPATIENT_CLINIC_OR_DEPARTMENT_OTHER)
Admission: EM | Admit: 2024-10-24 | Discharge: 2024-10-24 | Disposition: A | Discharge status: Home / Self Care | Source: Home / Self Care | Attending: Emergency Medicine | Admitting: Emergency Medicine

## 2024-10-24 ENCOUNTER — Other Ambulatory Visit: Payer: Self-pay

## 2024-10-24 ENCOUNTER — Emergency Department (HOSPITAL_BASED_OUTPATIENT_CLINIC_OR_DEPARTMENT_OTHER)

## 2024-10-24 DIAGNOSIS — W19XXXA Unspecified fall, initial encounter: Secondary | ICD-10-CM

## 2024-10-24 DIAGNOSIS — S7002XA Contusion of left hip, initial encounter: Secondary | ICD-10-CM

## 2024-10-24 DIAGNOSIS — S39012A Strain of muscle, fascia and tendon of lower back, initial encounter: Secondary | ICD-10-CM

## 2024-10-24 NOTE — ED Provider Notes (Signed)
 " [place] [facility] Provider Note   CSN: [id number] Arrival date & time: 10/24/24  1228     Patient presents with: [name] is a 79 y.o. female.  She is brought in by her friend for evaluation of left hip and back pain after a fall 4 days ago.  She said she slipped on the ice.  Has significant pain with any type of movement.  Due to the ice and caring for her husband she has not been able to follow-up with anybody until now.  She has a history of chronic pain and is on oxycodone .   The history is provided by the patient and a friend.  Fall This is a new problem. The current episode started more than 2 days ago. The problem has not changed since onset.Pertinent negatives include no chest pain, no abdominal pain, no headaches and no shortness of breath. The symptoms are aggravated by bending and twisting. Nothing relieves the symptoms. She has tried rest (oxycodone ) for the symptoms. The treatment provided no relief.       Prior to Admission medications  Medication Sig Start Date End Date Taking? Authorizing Provider  ALPRAZolam  (XANAX ) 1 MG tablet TAKE 1 TO 2 TABLETS BY MOUTH TWICE DAILY AS NEEDED 07/13/24   McGowen, Aleene DEL, MD  amoxicillin  (AMOXIL ) 500 MG capsule 4 tabs po x 1 dose, one hour prior to dental procedure 10/08/24   McGowen, Aleene DEL, MD  Azelastine  HCl (ASTEPRO ) 0.15 % SOLN 2 sprays each nostril every 12 hours Patient not taking: Reported on 10/04/2024 07/19/14   McGowen, Philip H, MD  BIOTIN PO Take 5 mg by mouth daily.    [provider]  calcium carbonate (OSCAL) 1500 (600 Ca) MG TABS tablet Take by mouth daily. Patient not taking: Reported on 10/04/2024    [provider]  Cholecalciferol (VITAMIN D3) 5000 units CAPS Take 4 capsules by mouth daily.  Patient not taking: Reported on 10/04/2024    [provider]  diclofenac Sodium (VOLTAREN) 1 % GEL Apply 4 g topically 4 (four) times daily.  09/03/20   [provider]  hydrocortisone  2.5 % cream Apply 1 application  topically as needed. RECTAL ITCHING 09/08/12   [provider]  levothyroxine  (SYNTHROID ) 75 MCG tablet Take 1 tablet (75 mcg total) by mouth daily. 04/04/24   McGowen, Aleene DEL, MD  meclizine  (ANTIVERT ) 25 MG tablet TAKE 1 TABLET BY MOUTH THREE TIMES DAILY AS NEEDED FOR DIZZINESS Patient taking differently: as needed for dizziness. TAKE 1 TABLET BY MOUTH THREE TIMES DAILY AS NEEDED FOR DIZZINESS 02/08/24   McGowen, Aleene DEL, MD  metoprolol  succinate (TOPROL -XL) 50 MG 24 hr tablet 1 and 1/2 tabs po qd 10/04/24   McGowen, Aleene DEL, MD  Multiple Vitamins-Iron (MULTIVITAMIN/IRON PO) Take 1 tablet by mouth daily. Patient not taking: Reported on 10/04/2024    [provider]  naloxegol  oxalate (MOVANTIK ) 12.5 MG TABS tablet Take 1 tablet (12.5 mg total) by mouth daily. Patient not taking: Reported on 10/04/2024 04/04/24   McGowen, Philip H, MD  omeprazole  (PRILOSEC) 40 MG capsule Take 1 capsule (40 mg total) by mouth daily. 11/04/23   McGowen, Aleene DEL, MD  Oxycodone  HCl 10 MG TABS 10 mg every 4 (four) hours as needed. 12/01/15   [provider]  promethazine  (PHENERGAN ) 25 MG tablet TAKE 1 TABLET BY MOUTH EVERY 6 HOURS AS NEEDED FOR NAUSEA AND VOMITING 10/04/24   McGowen, Aleene  H, MD  simvastatin  (ZOCOR ) 20 MG tablet Take 1 tablet (20 mg total) by mouth at bedtime. 11/04/23   McGowen, Aleene DEL, MD  traZODone (DESYREL) 50 MG tablet Take 25 mg by mouth at bedtime. 10/06/23   [provider]    Allergies: Cymbalta  [duloxetine  hcl], Iohexol, Mepergan [meperidine-promethazine ], Morphine, Augmentin  [amoxicillin -pot clavulanate], Citalopram , Hctz [hydrochlorothiazide ], Lexapro [escitalopram oxalate], Lyrica [pregabalin], Promethazine  hcl, and Valsartan    Review of Systems  Respiratory:  Negative for shortness of breath.   Cardiovascular:  Negative for chest pain.  Gastrointestinal:  Negative for  abdominal pain.  Neurological:  Negative for headaches.    Updated Vital Signs BP 126/81 (BP Location: Right Arm)   Pulse 60   Resp 18   Wt 87 kg   SpO2 95%   BMI 30.04 kg/m   Physical Exam Vitals and nursing note reviewed.  Constitutional:      General: She is not in acute distress.    Appearance: Normal appearance. She is well-developed.  HENT:     Head: Normocephalic and atraumatic.  Eyes:     Conjunctiva/sclera: Conjunctivae normal.  Cardiovascular:     Rate and Rhythm: Normal rate and regular rhythm.     Heart sounds: No murmur heard. Pulmonary:     Effort: Pulmonary effort is normal. No respiratory distress.     Breath sounds: Normal breath sounds.  Abdominal:     Palpations: Abdomen is soft.     Tenderness: There is no abdominal tenderness. There is no guarding or rebound.  Musculoskeletal:        General: Tenderness present.     Cervical back: Neck supple.     Comments: He is diffusely tender over lumbar and paralumbar.  Tenderness through pelvis and left hip left buttock area.  Also tender left knee.  Skin:    General: Skin is warm and dry.     Capillary Refill: Capillary refill takes less than 2 seconds.  Neurological:     General: No focal deficit present.     Mental Status: She is alert.     Sensory: No sensory deficit.     Motor: No weakness.     (all labs ordered are listed, but only abnormal results are displayed) Labs Reviewed - No data to display  EKG: None  Radiology: DG Lumbar Spine Complete Result Date: 10/24/2024 CLINICAL DATA:  Fall, left low back pain EXAM: LUMBAR SPINE - COMPLETE 4+ VIEW COMPARISON:  04/10/2024 FINDINGS: Similar mild convex left curvature. Similar posterior fusion spanning L2-L4. Stable alignment. Similar degenerative disc disease most pronounced at L1-2 with disc space narrowing, sclerosis and osteophyte formation. Preserved vertebral heights. No compression fracture. Aorta atherosclerotic. IMPRESSION: Stable postoperative  and degenerative changes as above. No acute finding by plain radiography. Electronically Signed   By: CHRISTELLA.  Shick M.D.   On: 10/24/2024 14:08   DG Hip Unilat With Pelvis 2-3 Views Left Result Date: 10/24/2024 CLINICAL DATA:  Fall, left hip pain EXAM: DG HIP (WITH OR WITHOUT PELVIS) 2-3V LEFT COMPARISON:  04/10/2024 FINDINGS: Intact left hip without acute osseous finding or fracture. Bony pelvis and hips are symmetric. No diastasis. Similar minor sclerosis of the SI joints. Advanced degenerative and postoperative findings of the lumbar spine as before. Overall stable exam. IMPRESSION: 1. No acute finding by plain radiography. 2. Degenerative and postoperative findings as above. Electronically Signed   By: CHRISTELLA.  Shick M.D.   On: 10/24/2024 14:05   DG Knee Complete 4 Views Left Result Date: 10/24/2024 CLINICAL DATA:  Recent fall, knee pain EXAM: LEFT KNEE - COMPLETE 4+ VIEW COMPARISON:  10/24/2024 FINDINGS: Tricompartmental osteoarthritis noted with joint space loss, sclerosis and bony spurring. No acute osseous finding, fracture, malalignment or large effusion. Soft tissues unremarkable. IMPRESSION: Tricompartmental osteoarthritis. No acute finding by plain radiography. Electronically Signed   By: CHRISTELLA.  Shick M.D.   On: 10/24/2024 14:04     Procedures   Medications Ordered in the ED - No data to display  Clinical Course as of 10/24/24 1647  Wed Oct 24, 2024  1421 Reviewed results of imaging with patient.  She is comfortable plan for discharge and outpatient follow-up.  She has an appointment with her pain doctor in a few days. [MB]    Clinical Course User Index [MB] Towana Ozell BROCKS, MD                                 Medical Decision Making Amount and/or Complexity of Data Reviewed Radiology: ordered.   This patient complains of lower back left hip buttock pain after fall; this involves an extensive number of treatment Options and is a complaint that carries with it a high risk of complications  and morbidity. The differential includes contusion, fracture, dislocation I ordered imaging studies which included x-rays of lumbar spine left knee left hip and pelvis and I independently    visualized and interpreted imaging which showed DJD no acute findings Additional history obtained from patient's companion Previous records obtained and reviewed in epic including prior ED visits Social determinants considered, stress depression physically inactive Critical Interventions: None  After the interventions stated above, I reevaluated the patient and found patient to be neurovascularly intact in no distress Admission and further testing considered, no indications for admission.  She will follow-up outpatient with her orthopedic team and her pain clinic.  Return instructions discussed.      Final diagnoses:  Strain of lumbar region, initial encounter  Contusion of left hip, initial encounter  Fall, initial encounter    ED Discharge Orders     None          Towana Ozell BROCKS, MD 10/24/24 1648  "

## 2024-10-24 NOTE — ED Triage Notes (Signed)
 Fell on 1/31 on ice , landed on left side , reports left lower back and hip pain , pain radiates to lower leg . On pain meds , pt appears drowsy , reports takes care of her husband and did not get much rest .

## 2024-10-24 NOTE — ED Notes (Signed)
 Patient transferred from waiting room to ED treatment room. Assuming pt care at this time.

## 2024-11-01 ENCOUNTER — Inpatient Hospital Stay: Admitting: Hematology & Oncology

## 2024-11-01 ENCOUNTER — Inpatient Hospital Stay

## 2024-11-05 ENCOUNTER — Ambulatory Visit: Admitting: Family Medicine
